# Patient Record
Sex: Female | Born: 1950 | Race: White | Hispanic: No | Marital: Married | Smoking: Former smoker
Health system: Southern US, Community
[De-identification: ages and names within clinical notes are randomized; demographics above are authoritative.]

## PROBLEM LIST (undated history)

## (undated) DIAGNOSIS — J45909 Unspecified asthma, uncomplicated: Secondary | ICD-10-CM

## (undated) DIAGNOSIS — I10 Essential (primary) hypertension: Secondary | ICD-10-CM

## (undated) DIAGNOSIS — G629 Polyneuropathy, unspecified: Secondary | ICD-10-CM

## (undated) DIAGNOSIS — E079 Disorder of thyroid, unspecified: Secondary | ICD-10-CM

## (undated) DIAGNOSIS — K7460 Unspecified cirrhosis of liver: Secondary | ICD-10-CM

## (undated) DIAGNOSIS — I219 Acute myocardial infarction, unspecified: Secondary | ICD-10-CM

## (undated) DIAGNOSIS — I509 Heart failure, unspecified: Secondary | ICD-10-CM

## (undated) DIAGNOSIS — Z9889 Other specified postprocedural states: Secondary | ICD-10-CM

## (undated) DIAGNOSIS — F419 Anxiety disorder, unspecified: Secondary | ICD-10-CM

## (undated) DIAGNOSIS — I4891 Unspecified atrial fibrillation: Secondary | ICD-10-CM

## (undated) DIAGNOSIS — Z950 Presence of cardiac pacemaker: Secondary | ICD-10-CM

## (undated) DIAGNOSIS — E119 Type 2 diabetes mellitus without complications: Secondary | ICD-10-CM

## (undated) DIAGNOSIS — J449 Chronic obstructive pulmonary disease, unspecified: Secondary | ICD-10-CM

## (undated) DIAGNOSIS — R112 Nausea with vomiting, unspecified: Secondary | ICD-10-CM

## (undated) DIAGNOSIS — D539 Nutritional anemia, unspecified: Secondary | ICD-10-CM

## (undated) DIAGNOSIS — E785 Hyperlipidemia, unspecified: Secondary | ICD-10-CM

## (undated) DIAGNOSIS — J189 Pneumonia, unspecified organism: Secondary | ICD-10-CM

## (undated) DIAGNOSIS — T7840XA Allergy, unspecified, initial encounter: Secondary | ICD-10-CM

## (undated) DIAGNOSIS — N186 End stage renal disease: Secondary | ICD-10-CM

## (undated) DIAGNOSIS — M1990 Unspecified osteoarthritis, unspecified site: Secondary | ICD-10-CM

## (undated) DIAGNOSIS — Z992 Dependence on renal dialysis: Secondary | ICD-10-CM

## (undated) HISTORY — PX: RADIOFREQUENCY ABLATION: SHX2290

## (undated) HISTORY — PX: ABDOMINAL HYSTERECTOMY: SHX81

## (undated) HISTORY — PX: CARDIAC CATHETERIZATION: SHX172

## (undated) HISTORY — DX: Anxiety disorder, unspecified: F41.9

## (undated) HISTORY — DX: Unspecified atrial fibrillation: I48.91

## (undated) HISTORY — DX: Essential (primary) hypertension: I10

## (undated) HISTORY — DX: Acute myocardial infarction, unspecified: I21.9

## (undated) HISTORY — DX: Hyperlipidemia, unspecified: E78.5

## (undated) HISTORY — DX: Allergy, unspecified, initial encounter: T78.40XA

## (undated) HISTORY — DX: Unspecified asthma, uncomplicated: J45.909

## (undated) HISTORY — DX: Unspecified cirrhosis of liver: K74.60

## (undated) HISTORY — DX: Type 2 diabetes mellitus without complications: E11.9

## (undated) HISTORY — PX: TUBAL LIGATION: SHX77

## (undated) HISTORY — PX: TOTAL KNEE ARTHROPLASTY: SHX125

## (undated) HISTORY — DX: Chronic obstructive pulmonary disease, unspecified: J44.9

## (undated) HISTORY — DX: Heart failure, unspecified: I50.9

## (undated) HISTORY — DX: Unspecified osteoarthritis, unspecified site: M19.90

## (undated) SURGERY — ESOPHAGOGASTRODUODENOSCOPY (EGD) WITH PROPOFOL
Anesthesia: Monitor Anesthesia Care

---
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
## 1999-02-09 ENCOUNTER — Emergency Department (HOSPITAL_COMMUNITY): Admission: EM | Admit: 1999-02-09 | Discharge: 1999-02-09 | Payer: Self-pay | Admitting: Emergency Medicine

## 2004-03-19 ENCOUNTER — Ambulatory Visit: Payer: Self-pay | Admitting: Internal Medicine

## 2004-04-14 ENCOUNTER — Ambulatory Visit: Payer: Self-pay

## 2004-12-02 ENCOUNTER — Ambulatory Visit: Payer: Self-pay | Admitting: Internal Medicine

## 2005-06-03 ENCOUNTER — Ambulatory Visit: Payer: Self-pay

## 2008-09-14 ENCOUNTER — Emergency Department (HOSPITAL_BASED_OUTPATIENT_CLINIC_OR_DEPARTMENT_OTHER): Admission: EM | Admit: 2008-09-14 | Discharge: 2008-09-14 | Payer: Self-pay | Admitting: Emergency Medicine

## 2008-09-14 ENCOUNTER — Ambulatory Visit: Payer: Self-pay | Admitting: Diagnostic Radiology

## 2013-03-06 ENCOUNTER — Other Ambulatory Visit: Payer: Self-pay | Admitting: *Deleted

## 2013-03-06 DIAGNOSIS — L819 Disorder of pigmentation, unspecified: Secondary | ICD-10-CM

## 2013-03-12 ENCOUNTER — Encounter: Payer: Self-pay | Admitting: Vascular Surgery

## 2013-03-13 ENCOUNTER — Ambulatory Visit: Payer: Medicare Other | Admitting: Vascular Surgery

## 2013-03-13 ENCOUNTER — Encounter (HOSPITAL_COMMUNITY): Payer: Medicare Other

## 2013-03-26 NOTE — Progress Notes (Signed)
This encounter was created in error - please disregard.

## 2013-03-30 ENCOUNTER — Encounter: Payer: Self-pay | Admitting: Surgery

## 2013-04-02 ENCOUNTER — Ambulatory Visit (HOSPITAL_COMMUNITY)
Admission: RE | Admit: 2013-04-02 | Discharge: 2013-04-02 | Disposition: A | Discharge status: Home / Self Care | Payer: Medicare Other | Source: Ambulatory Visit | Attending: Surgery | Admitting: Surgery

## 2013-04-02 ENCOUNTER — Encounter: Payer: Self-pay | Admitting: Surgery

## 2013-04-02 ENCOUNTER — Encounter (INDEPENDENT_AMBULATORY_CARE_PROVIDER_SITE_OTHER): Payer: Self-pay

## 2013-04-02 ENCOUNTER — Ambulatory Visit (INDEPENDENT_AMBULATORY_CARE_PROVIDER_SITE_OTHER): Payer: Medicare Other | Admitting: Surgery

## 2013-04-02 VITALS — BP 152/68 | HR 53 | Ht 62.5 in | Wt 213.4 lb

## 2013-04-02 DIAGNOSIS — R238 Other skin changes: Secondary | ICD-10-CM | POA: Insufficient documentation

## 2013-04-02 DIAGNOSIS — L819 Disorder of pigmentation, unspecified: Secondary | ICD-10-CM

## 2013-04-02 DIAGNOSIS — I739 Peripheral vascular disease, unspecified: Secondary | ICD-10-CM | POA: Insufficient documentation

## 2013-04-02 NOTE — Progress Notes (Signed)
Patient name: Alexis [name] MRN: [id number] DOB: [birth year] Sex: female   Referred by: Dr. Ronnald Ramp  Reason for referral:  Chief Complaint  Patient presents with  . New Evaluation    dicoloration of skin L toes - cool to touch    HISTORY OF PRESENT ILLNESS: 63 year old female who was referred today for evaluation of bluish discoloration of her left foot.  She was scheduled to have her toenail removed and was sent for vascular evaluation given the discoloration.  The patient states that this all goes back to when she had a left knee replacement.  She describes no complications during her perioperative period.  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  She does not have pain in the foot or symptoms of claudication.  Patient suffers from diabetes which is not well controlled.  She states her blood sugars in the morning are and the 200 range.  She has a history of congestive heart failure.  She is in atrial fibrillation.  She is managed with amiodarone and Eliquis.  She has been on Coumadin in the past, however had a fall with an orbital floor fracture and has not been on Coumadin since that time.  Her hypercholesterolemia is managed with a statin.  Her blood pressure is managed with an ACE inhibitor.  The patient has a history of smoking but quit in 2000.  Past Medical History  Diagnosis Date  . CHF (congestive heart failure)   . Diabetes mellitus without complication   . Atrial fibrillation     Past Surgical History  Procedure Laterality Date  . Total knee arthroplasty    . Coronary artery bypass graft      History   Social History  . Marital Status: Married    Spouse Name: N/A    Number of Children: N/A  . Years of Education: N/A   Occupational History  . Not on file.   Social History Main Topics  . Smoking status: Former Smoker    Quit date: 03/01/1998  . Smokeless tobacco: Never Used    . Alcohol Use: No  . Drug Use: No  . Sexual Activity: Not on file   Other Topics Concern  . Not on file   Social History Narrative  . No narrative on file    Family History  Problem Relation Age of Onset  . Diabetes Mother   . Hyperlipidemia Mother   . Diabetes Father   . Hyperlipidemia Father   . Heart disease Father     before age 45  . Heart attack Father   . Diabetes Brother   . Hyperlipidemia Brother   . Heart attack Brother     Allergies as of 04/02/2013  . (No Known Allergies)    No current outpatient prescriptions on file prior to visit.   No current facility-administered medications on file prior to visit.     REVIEW OF SYSTEMS: Cardiovascular: No chest pain, chest pressure, palpitations, orthopnea, or dyspnea on exertion. No claudication or rest pain,  No history of DVT or phlebitis. Pulmonary: Positive for asthma and wheezing Neurologic:  Positive for numbness in her left foot No dizziness. Hematologic: No bleeding problems or clotting disorders. Musculoskeletal: No joint pain or joint swelling. Gastrointestinal: No blood in stool or hematemesis Genitourinary: No dysuria or hematuria. Psychiatric:: No history of major depression. Integumentary: No rashes or ulcers. Constitutional: No fever or chills.  PHYSICAL EXAMINATION: General: The patient appears their stated age.  Vital signs are BP   Ht 5' 2.5" (1.588 m)  Wt 213 lb 6.4 oz (96.798 kg)  BMI 38.39 kg/m2 HEENT:  No gross abnormalities Pulmonary: Respirations are non-labored Abdomen: Soft and non-tender  Musculoskeletal: There are no major deformities.   Neurologic: No focal weakness or paresthesias are detected, Skin: There are no ulcer or rashes noted. Psychiatric: The patient has normal affect. Cardiovascular: There is a regular rate and rhythm without significant murmur appreciated.  No carotid bruit  Diagnostic Studies: Ankle brachial indices were obtained today which I have ordered  and reviewed.  She has normal ankle-brachial indices, 1.1 on the right and one 0.04 on the left.  All waveforms are triphasic.  Digital pressures are 107 on the right and 112 on the left    Assessment:  Discoloration, left foot Plan: I have reassured the patient today than the discoloration is not secondary to underlying vascular disease.  Her ankle-brachial indices as well as toe pressures were completely normal today.  This suggests that her discoloration secondary to underlying nerve damage, which according the patient has been confirmed with nerve conduction studies in the past.  I discussed the importance of daily foot examinations and to contact me should she develop a nonhealing wound.  Otherwise, she will followup on an as-needed basis.     Eldridge Abrahams, M.D. [facility] Office: [phone] Pager:  [phone]

## 2014-03-01 LAB — HM DIABETES EYE EXAM

## 2014-07-09 ENCOUNTER — Encounter (HOSPITAL_BASED_OUTPATIENT_CLINIC_OR_DEPARTMENT_OTHER): Payer: Self-pay | Admitting: Emergency Medicine

## 2014-07-09 ENCOUNTER — Emergency Department (HOSPITAL_BASED_OUTPATIENT_CLINIC_OR_DEPARTMENT_OTHER)
Admission: EM | Admit: 2014-07-09 | Discharge: 2014-07-09 | Disposition: A | Discharge status: Home / Self Care | Payer: Medicare Other | Attending: Emergency Medicine | Admitting: Emergency Medicine

## 2014-07-09 DIAGNOSIS — Z951 Presence of aortocoronary bypass graft: Secondary | ICD-10-CM | POA: Insufficient documentation

## 2014-07-09 DIAGNOSIS — L03115 Cellulitis of right lower limb: Secondary | ICD-10-CM | POA: Insufficient documentation

## 2014-07-09 DIAGNOSIS — E079 Disorder of thyroid, unspecified: Secondary | ICD-10-CM | POA: Diagnosis not present

## 2014-07-09 DIAGNOSIS — I509 Heart failure, unspecified: Secondary | ICD-10-CM | POA: Insufficient documentation

## 2014-07-09 DIAGNOSIS — Z79899 Other long term (current) drug therapy: Secondary | ICD-10-CM | POA: Insufficient documentation

## 2014-07-09 DIAGNOSIS — M10071 Idiopathic gout, right ankle and foot: Secondary | ICD-10-CM | POA: Insufficient documentation

## 2014-07-09 DIAGNOSIS — E119 Type 2 diabetes mellitus without complications: Secondary | ICD-10-CM | POA: Insufficient documentation

## 2014-07-09 DIAGNOSIS — M79671 Pain in right foot: Secondary | ICD-10-CM | POA: Diagnosis present

## 2014-07-09 DIAGNOSIS — Z87891 Personal history of nicotine dependence: Secondary | ICD-10-CM | POA: Diagnosis not present

## 2014-07-09 DIAGNOSIS — M109 Gout, unspecified: Secondary | ICD-10-CM

## 2014-07-09 DIAGNOSIS — I4891 Unspecified atrial fibrillation: Secondary | ICD-10-CM | POA: Diagnosis not present

## 2014-07-09 DIAGNOSIS — L03119 Cellulitis of unspecified part of limb: Secondary | ICD-10-CM

## 2014-07-09 HISTORY — DX: Disorder of thyroid, unspecified: E07.9

## 2014-07-09 MED ORDER — DEXAMETHASONE 4 MG PO TABS
10.0000 mg | ORAL_TABLET | Freq: Once | ORAL | Status: AC
  Administered 2014-07-09: 10 mg via ORAL

## 2014-07-09 MED ORDER — CLINDAMYCIN HCL 150 MG PO CAPS: 450.0000 mg | ORAL_CAPSULE | Freq: Three times a day (TID) | ORAL | Status: DC

## 2014-07-09 MED ORDER — CLINDAMYCIN HCL 150 MG PO CAPS
450.0000 mg | ORAL_CAPSULE | Freq: Once | ORAL | Status: AC
  Administered 2014-07-09: 450 mg via ORAL
  Filled 2014-07-09: qty 3

## 2014-07-09 MED ORDER — DEXAMETHASONE 4 MG PO TABS
ORAL_TABLET | ORAL | Status: AC
  Filled 2014-07-09: qty 3

## 2014-07-09 NOTE — Discharge Instructions (Signed)

## 2014-07-09 NOTE — ED Notes (Signed)
Pt in c/o insect bite to R foot, erythema and swelling noted to foot in triage. Pt first thought it was gout, but then the general appearance was different than her gout sx over the past few days.

## 2014-07-09 NOTE — ED Notes (Signed)
Pt repositioned for comfort, elevated rt foot per pt request

## 2014-07-09 NOTE — ED Notes (Signed)
Presents to ED w/ Rt foot pain, swelling and redness

## 2014-07-09 NOTE — ED Notes (Signed)
MD at bedside. 

## 2014-07-09 NOTE — ED Provider Notes (Signed)
CSN: [id number]     Arrival date & time 07/09/14  76 History   First MD Initiated Contact with Patient 07/09/14 1546     Chief Complaint  Patient presents with  . Insect Bite     (Consider location/radiation/quality/duration/timing/severity/associated sxs/prior Treatment) HPI Comments: R foot pain in her medial midfoot behind 1st-3rd toes. No fevers, no vomiting. She thought this was her gout, as she has hx of the same. Denies any systemic symptoms. Has hx acute renal failure from "a gout medicine" previously.  Patient is a 64 y.o. female presenting with lower extremity pain. The history is provided by the patient.  Foot Pain This is a new problem. The current episode started more than 2 days ago (4 days ago). The problem occurs constantly. The problem has been gradually worsening. Pertinent negatives include no chest pain, no abdominal pain, no headaches and no shortness of breath. Nothing aggravates the symptoms. Nothing relieves the symptoms. She has tried nothing for the symptoms.    Past Medical History  Diagnosis Date  . CHF (congestive heart failure)   . Diabetes mellitus without complication   . Atrial fibrillation   . Thyroid disease    Past Surgical History  Procedure Laterality Date  . Total knee arthroplasty    . Coronary artery bypass graft     Family History  Problem Relation Age of Onset  . Diabetes Mother   . Hyperlipidemia Mother   . Diabetes Father   . Hyperlipidemia Father   . Heart disease Father     before age 66  . Heart attack Father   . Diabetes Brother   . Hyperlipidemia Brother   . Heart attack Brother    History  Substance Use Topics  . Smoking status: Former Smoker    Quit date: 03/01/1998  . Smokeless tobacco: Never Used  . Alcohol Use: No   OB History    No data available     Review of Systems  Constitutional: Negative for fever and chills.  Respiratory: Negative for shortness of breath.   Cardiovascular: Negative for chest  pain.  Gastrointestinal: Negative for vomiting and abdominal pain.  Neurological: Negative for headaches.  All other systems reviewed and are negative.     Allergies  Sulfa antibiotics  Home Medications   Prior to Admission medications   Medication Sig Start Date End Date Taking? Authorizing Provider  Apixaban (ELIQUIS PO) Take by mouth daily.    Historical Provider, MD  atorvastatin (LIPITOR) 10 MG tablet Take 10 mg by mouth daily.    Historical Provider, MD  clindamycin (CLEOCIN) 150 MG capsule Take 3 capsules (450 mg total) by mouth 3 (three) times daily. 07/09/14   Evelina Bucy, MD  cloNIDine (CATAPRES) 0.1 MG tablet Take 0.1 mg by mouth 2 (two) times daily.    Historical Provider, MD  colchicine 0.6 MG tablet Take 0.6 mg by mouth as needed.    Historical Provider, MD  HYDROcodone-acetaminophen (NORCO) 10-325 MG per tablet Take 1 tablet by mouth every 6 (six) hours as needed.    Historical Provider, MD  levothyroxine (SYNTHROID, LEVOTHROID) 175 MCG tablet Take 175 mcg by mouth daily before breakfast.    Historical Provider, MD  metoprolol succinate (TOPROL-XL) 25 MG 24 hr tablet Take 25 mg by mouth daily.    Historical Provider, MD  sertraline (ZOLOFT) 100 MG tablet Take 100 mg by mouth daily.    Historical Provider, MD  torsemide (DEMADEX) 20 MG tablet Take 20 mg by mouth daily. 1/2  tablet daily    Historical Provider, MD   BP 133/58 mmHg  Pulse 86  Temp(Src) 98.2 F (36.8 C) (Oral)  Resp 18  Ht 5' 2"  (1.575 m)  Wt 200 lb (90.719 kg)  BMI 36.57 kg/m2  SpO2 99% Physical Exam  Constitutional: She is oriented to person, place, and time. She appears well-developed and well-nourished. No distress.  HENT:  Head: Normocephalic and atraumatic.  Mouth/Throat: Oropharynx is clear and moist.  Eyes: EOM are normal. Pupils are equal, round, and reactive to light.  Neck: Normal range of motion. Neck supple.  Cardiovascular: Normal rate and regular rhythm.  Exam reveals no friction  rub.   No murmur heard. Pulmonary/Chest: Effort normal and breath sounds normal. No respiratory distress. She has no wheezes. She has no rales.  Abdominal: Soft. She exhibits no distension. There is no tenderness. There is no rebound.  Musculoskeletal: Normal range of motion. She exhibits no edema.       Feet:  Sole of R foot without lesion, no foreign bodies.  Neurological: She is alert and oriented to person, place, and time.  Skin: She is not diaphoretic.  Nursing note and vitals reviewed.   ED Course  Procedures (including critical care time) Labs Review Labs Reviewed - No data to display  Imaging Review No results found.   EKG Interpretation None      MDM   Final diagnoses:  Cellulitis of foot  Acute gout of right foot, unspecified cause    58F here with R foot pain and swelling. She thought this was her chronic gout, however it hasn't gotten betterand she normally has gout in her right great toe not in her right midfoot. She denies any fever, vomiting, diarrhea. She has had renal failure after using an unspecified gout medication and she states colchicine gives her nausea and vomiting. I suspect her previous gout medication to cause renal failure was indomethacin. Here right foot is erythematous and mildly swollen. Is no pitting edema. No foreign bodies in the bottom of the foot. No lesions were trauma. Since she is diabetic we'll treat for cellulitis and gout also. I do not want to cause blood sugar issues so we'll give 1 dose of Decadron and not a large prednisone burst. Will start her on clindamycin and encourage PCP follow-up.    Evelina Bucy, MD 07/09/14 [phone]

## 2014-07-12 ENCOUNTER — Ambulatory Visit (INDEPENDENT_AMBULATORY_CARE_PROVIDER_SITE_OTHER): Payer: Medicare Other | Admitting: Medical

## 2014-07-12 ENCOUNTER — Ambulatory Visit (HOSPITAL_BASED_OUTPATIENT_CLINIC_OR_DEPARTMENT_OTHER)
Admission: RE | Admit: 2014-07-12 | Discharge: 2014-07-12 | Disposition: A | Discharge status: Home / Self Care | Payer: Medicare Other | Source: Ambulatory Visit | Attending: Medical | Admitting: Medical

## 2014-07-12 ENCOUNTER — Encounter: Payer: Self-pay | Admitting: Medical

## 2014-07-12 VITALS — BP 157/85 | HR 98 | Temp 97.4°F | Ht 62.5 in | Wt 199.4 lb

## 2014-07-12 DIAGNOSIS — I2510 Atherosclerotic heart disease of native coronary artery without angina pectoris: Secondary | ICD-10-CM

## 2014-07-12 DIAGNOSIS — J45909 Unspecified asthma, uncomplicated: Secondary | ICD-10-CM | POA: Insufficient documentation

## 2014-07-12 DIAGNOSIS — M25579 Pain in unspecified ankle and joints of unspecified foot: Secondary | ICD-10-CM | POA: Insufficient documentation

## 2014-07-12 DIAGNOSIS — M25571 Pain in right ankle and joints of right foot: Secondary | ICD-10-CM | POA: Diagnosis present

## 2014-07-12 DIAGNOSIS — E119 Type 2 diabetes mellitus without complications: Secondary | ICD-10-CM | POA: Diagnosis not present

## 2014-07-12 DIAGNOSIS — E038 Other specified hypothyroidism: Secondary | ICD-10-CM

## 2014-07-12 DIAGNOSIS — I509 Heart failure, unspecified: Secondary | ICD-10-CM

## 2014-07-12 DIAGNOSIS — M158 Other polyosteoarthritis: Secondary | ICD-10-CM | POA: Diagnosis not present

## 2014-07-12 DIAGNOSIS — I4821 Permanent atrial fibrillation: Secondary | ICD-10-CM | POA: Insufficient documentation

## 2014-07-12 DIAGNOSIS — J301 Allergic rhinitis due to pollen: Secondary | ICD-10-CM

## 2014-07-12 DIAGNOSIS — J4520 Mild intermittent asthma, uncomplicated: Secondary | ICD-10-CM

## 2014-07-12 DIAGNOSIS — E039 Hypothyroidism, unspecified: Secondary | ICD-10-CM | POA: Insufficient documentation

## 2014-07-12 DIAGNOSIS — I4891 Unspecified atrial fibrillation: Secondary | ICD-10-CM

## 2014-07-12 DIAGNOSIS — I10 Essential (primary) hypertension: Secondary | ICD-10-CM

## 2014-07-12 DIAGNOSIS — Z87898 Personal history of other specified conditions: Secondary | ICD-10-CM

## 2014-07-12 DIAGNOSIS — J309 Allergic rhinitis, unspecified: Secondary | ICD-10-CM | POA: Insufficient documentation

## 2014-07-12 DIAGNOSIS — M1990 Unspecified osteoarthritis, unspecified site: Secondary | ICD-10-CM | POA: Insufficient documentation

## 2014-07-12 DIAGNOSIS — F1911 Other psychoactive substance abuse, in remission: Secondary | ICD-10-CM

## 2014-07-12 LAB — CBC WITH DIFFERENTIAL/PLATELET
Basophils Absolute: 0 10*3/uL (ref 0.0–0.1)
Basophils Relative: 0.4 % (ref 0.0–3.0)
Eosinophils Absolute: 0.1 10*3/uL (ref 0.0–0.7)
Eosinophils Relative: 1.2 % (ref 0.0–5.0)
HCT: 42.6 % (ref 36.0–46.0)
Hemoglobin: 14.9 g/dL (ref 12.0–15.0)
LYMPHS ABS: 1 10*3/uL (ref 0.7–4.0)
Lymphocytes Relative: 16.3 % (ref 12.0–46.0)
MCHC: 34.9 g/dL (ref 30.0–36.0)
MCV: 88.1 fl (ref 78.0–100.0)
MONO ABS: 0.5 10*3/uL (ref 0.1–1.0)
Monocytes Relative: 8.2 % (ref 3.0–12.0)
NEUTROS PCT: 73.9 % (ref 43.0–77.0)
Neutro Abs: 4.6 10*3/uL (ref 1.4–7.7)
Platelets: 124 10*3/uL — ABNORMAL LOW (ref 150.0–400.0)
RBC: 4.84 Mil/uL (ref 3.87–5.11)
RDW: 15.7 % — AB (ref 11.5–15.5)
WBC: 6.2 10*3/uL (ref 4.0–10.5)

## 2014-07-12 LAB — COMPREHENSIVE METABOLIC PANEL
ALBUMIN: 3.9 g/dL (ref 3.5–5.2)
ALK PHOS: 138 U/L — AB (ref 39–117)
ALT: 65 U/L — AB (ref 0–35)
AST: 64 U/L — AB (ref 0–37)
BUN: 28 mg/dL — ABNORMAL HIGH (ref 6–23)
CO2: 32 mEq/L (ref 19–32)
CREATININE: 0.84 mg/dL (ref 0.40–1.20)
Calcium: 10 mg/dL (ref 8.4–10.5)
Chloride: 96 mEq/L (ref 96–112)
GFR: 72.23 mL/min (ref >60.00)
Glucose, Bld: 170 mg/dL — ABNORMAL HIGH (ref 70–99)
Potassium: 3.3 mEq/L — ABNORMAL LOW (ref 3.5–5.1)
Sodium: 135 mEq/L (ref 135–145)
Total Bilirubin: 0.7 mg/dL (ref 0.2–1.2)
Total Protein: 8.4 g/dL — ABNORMAL HIGH (ref 6.0–8.3)

## 2014-07-12 LAB — URIC ACID: Uric Acid, Serum: 7.4 mg/dL — ABNORMAL HIGH (ref 2.4–7.0)

## 2014-07-12 NOTE — Assessment & Plan Note (Signed)
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

## 2014-07-12 NOTE — Assessment & Plan Note (Signed)
Seasonal allergies- she is on flonase. On this one time a day.

## 2014-07-12 NOTE — Assessment & Plan Note (Signed)
Rt foot cellulitis improving vs gout. Continue antibiotic. Get xray of foot, cbc, cmp, and uric acid.  Will follow labs and then determine if give nsaid. Side effects to colchicine in the past.  Follow up on wed morning or sooner. If foot signs or symptoms worsen over weekend then ED evaluation.

## 2014-07-12 NOTE — Assessment & Plan Note (Signed)
Pt has pacemaker now. Hx of ablation sx.

## 2014-07-12 NOTE — Assessment & Plan Note (Signed)
htn- pt on clonidine and metoprolol.

## 2014-07-12 NOTE — Assessment & Plan Note (Signed)
when younger and when more overweight. No inhaler use recently.

## 2014-07-12 NOTE — Assessment & Plan Note (Signed)
Hx of 2 MI.

## 2014-07-12 NOTE — Assessment & Plan Note (Signed)
Osteoarthritis- her neck and her knees.

## 2014-07-12 NOTE — Progress Notes (Signed)
Pre visit review using our clinic review tool, if applicable. No additional management support is needed unless otherwise documented below in the visit note. 

## 2014-07-12 NOTE — Assessment & Plan Note (Signed)
on levothyroxine

## 2014-07-12 NOTE — Patient Instructions (Signed)
Diabetes Diabetic type II- pt is on humolog before dinner(sliding scale) and lantus at night 60 units. Last a1-c was 6. Pt sees endocrinologist.   Allergic rhinitis Seasonal allergies- she is on flonase. On this one time a day.   Osteoarthritis Osteoarthritis- her neck and her knees.    Asthma  when younger and when more overweight. No inhaler use recently.   CHF (congestive heart failure) history of with several heart attacks in past. Hx of atrial fibrillation. Pt has pacemaker. POt is on demadex.   CAD (coronary artery disease) Hx of 2 MI.   Atrial fibrillation Pt has pacemaker now. Hx of ablation sx.   HTN (hypertension) htn- pt on clonidine and metoprolol.   Hypothyroidism  on levothyroxine   Pain in joint, ankle and foot Rt foot cellulitis improving vs gout. Continue antibiotic. Get xray of foot, cbc, cmp, and uric acid.  Will follow labs and then determine if give nsaid. Side effects to colchicine in the past.  Follow up on wed morning or sooner. If foot signs or symptoms worsen over weekend then ED evaluation.     Note 50 minutes was spent with pt today.

## 2014-07-12 NOTE — Assessment & Plan Note (Signed)
history of with several heart attacks in past. Hx of atrial fibrillation. Pt has pacemaker. POt is on demadex.

## 2014-07-12 NOTE — Progress Notes (Signed)
Subjective:    Patient ID: Alexis [name], female    DOB: 27-Sep-1950, 64 y.o.   MRN: [id number]  HPI  I have reviewed pt PMH, PSH, FH, Social History and Surgical History.  Anxiety- Last xanax she had was 21 st of march. She wanted to get of xanax for 30 years. She was motivated by her brothers who passed away from overdoses.  Diabetic type II- pt is on humolog before dinner(sliding scale) and lantus at night 60 units. Last a1-c was 6. Pt sees endocrinologist.  Diabetic neuropathy- Pt was given hydrocodone for this in the past. She used to be on med 3 times a day. She told her neurologist she wanted to get off of med. So she was tapered down over past 2-3 weeks. Pt tried neurontin in past but had side effects. Other medications for nerve pain have not worked. Pt is on new medication for nerve pain but she can't remember and she left med at home.  Seasonal allergies- she is on flonase. On this one time a day.   Osteoarthritis- her neck and her knees.   Asthma- when younger and when more overweight. No inhaler use recently.  Chf- history of with several heart attacks in past. Hx of atrial fibrillation. Pt has pacemaker. Pt is on demadex.  CAD- hx of MI 2-3 per pt.  htn- pt on clonidine and metoprolol.  Hyperlipdemia- She is on lipitor.   Hypothryoid- on levothyroxine.  Anxiety- pt is on sertraline 100 mg q day.   Pt never had a colonoscopy and refuses. No mammogram for a while.  Pt recently on 07-09-2014 treated for gout. Pt started on clindamycin and she feels some better. No fevers or chills. Pt states Monday morning was swollen. Then Tuesday she went to ED. Pt given decadron low dose.     Review of Systems  Constitutional: Negative for fever, chills, diaphoresis, activity change and fatigue.  Respiratory: Negative for cough, chest tightness and shortness of breath.   Cardiovascular: Negative for chest pain, palpitations and leg swelling.  Gastrointestinal: Negative  for nausea, vomiting and abdominal pain.  Musculoskeletal: Negative for neck pain and neck stiffness.       Rt foot pain.  Neurological: Negative for dizziness, tremors, seizures, syncope, facial asymmetry, speech difficulty, weakness, light-headedness, numbness and headaches.  Psychiatric/Behavioral: Negative for behavioral problems, confusion and agitation. The patient is not nervous/anxious.    Past Medical History  Diagnosis Date  . CHF (congestive heart failure)   . Diabetes mellitus without complication   . Atrial fibrillation   . Thyroid disease   . Allergy   . Anxiety   . Arthritis   . Asthma   . COPD (chronic obstructive pulmonary disease)   . Hyperlipidemia   . Hypertension   . Myocardial infarction     History   Social History  . Marital Status: Married    Spouse Name: N/A  . Number of Children: N/A  . Years of Education: N/A   Occupational History  . Not on file.   Social History Main Topics  . Smoking status: Former Smoker    Quit date: 03/01/1998  . Smokeless tobacco: Never Used  . Alcohol Use: No  . Drug Use: No  . Sexual Activity: Not on file   Other Topics Concern  . Not on file   Social History Narrative    Past Surgical History  Procedure Laterality Date  . Total knee arthroplasty    . Coronary artery bypass graft  Family History  Problem Relation Age of Onset  . Diabetes Mother   . Hyperlipidemia Mother   . Diabetes Father   . Hyperlipidemia Father   . Heart disease Father     before age 59  . Heart attack Father   . Diabetes Brother   . Hyperlipidemia Brother   . Heart attack Brother     Allergies  Allergen Reactions  . Sulfa Antibiotics Hives    Current Outpatient Prescriptions on File Prior to Visit  Medication Sig Dispense Refill  . Apixaban (ELIQUIS PO) Take by mouth daily.    Marland Kitchen atorvastatin (LIPITOR) 10 MG tablet Take 10 mg by mouth daily.    . clindamycin (CLEOCIN) 150 MG capsule Take 3 capsules (450 mg total)  by mouth 3 (three) times daily. 90 capsule 0  . cloNIDine (CATAPRES) 0.1 MG tablet Take 0.1 mg by mouth 2 (two) times daily.    Marland Kitchen HYDROcodone-acetaminophen (NORCO) 10-325 MG per tablet Take 1 tablet by mouth every 6 (six) hours as needed.    Marland Kitchen levothyroxine (SYNTHROID, LEVOTHROID) 175 MCG tablet Take 175 mcg by mouth daily before breakfast.    . metoprolol succinate (TOPROL-XL) 25 MG 24 hr tablet Take 25 mg by mouth daily.    . sertraline (ZOLOFT) 100 MG tablet Take 100 mg by mouth daily.    Marland Kitchen torsemide (DEMADEX) 20 MG tablet Take 20 mg by mouth daily. 1/2 tablet daily     No current facility-administered medications on file prior to visit.    BP 157/85 mmHg  Pulse 98  Temp(Src) 97.4 F (36.3 C) (Oral)  Ht 5' 2.5" (1.588 m)  Wt 199 lb 6.4 oz (90.447 kg)  BMI 35.87 kg/m2  SpO2 98%       Objective:   Physical Exam  General Mental Status- Alert. General Appearance- Not in acute distress.   Skin General: Color- Normal Color. Moisture- Normal Moisture.  Neck Carotid Arteries- Normal color. Moisture- Normal Moisture. No carotid bruits. No JVD.  Chest and Lung Exam Auscultation: Breath Sounds:-Normal.  Cardiovascular Auscultation:Rythm- Regular. Murmurs & Other Heart Sounds:Auscultation of the heart reveals- No Murmurs.  Abdomen Inspection:-Inspeection Normal. Palpation/Percussion:Note:No mass. Palpation and Percussion of the abdomen reveal- Non Tender, Non Distended + BS, no rebound or guarding.    Neurologic Cranial Nerve exam:- CN III-XII intact(No nystagmus), symmetric smile. Strength:- 5/5 equal and symmetric strength both upper and lower extremities.  Rt foot- pulses intact. No ulcers or lesions. Mild red area above toes. Faint warmth and tender.      Assessment & Plan:

## 2014-07-12 NOTE — Assessment & Plan Note (Signed)
Diabetic type II- pt is on humolog before dinner(sliding scale) and lantus at night 60 units. Last a1-c was 6. Pt sees endocrinologist.

## 2014-07-17 ENCOUNTER — Ambulatory Visit: Payer: Medicare Other | Admitting: Medical

## 2014-07-18 ENCOUNTER — Ambulatory Visit (INDEPENDENT_AMBULATORY_CARE_PROVIDER_SITE_OTHER): Payer: Medicare Other | Admitting: Medical

## 2014-07-18 ENCOUNTER — Encounter: Payer: Self-pay | Admitting: Medical

## 2014-07-18 VITALS — BP 129/80 | HR 100 | Temp 98.4°F | Ht 62.5 in | Wt 197.0 lb

## 2014-07-18 DIAGNOSIS — E876 Hypokalemia: Secondary | ICD-10-CM | POA: Diagnosis not present

## 2014-07-18 DIAGNOSIS — D696 Thrombocytopenia, unspecified: Secondary | ICD-10-CM

## 2014-07-18 DIAGNOSIS — M25571 Pain in right ankle and joints of right foot: Secondary | ICD-10-CM

## 2014-07-18 LAB — CBC WITH DIFFERENTIAL/PLATELET
BASOS ABS: 0 10*3/uL (ref 0.0–0.1)
BASOS PCT: 0.2 % (ref 0.0–3.0)
EOS ABS: 0.1 10*3/uL (ref 0.0–0.7)
Eosinophils Relative: 0.9 % (ref 0.0–5.0)
HCT: 41.8 % (ref 36.0–46.0)
Hemoglobin: 14.6 g/dL (ref 12.0–15.0)
LYMPHS PCT: 16.4 % (ref 12.0–46.0)
Lymphs Abs: 1 10*3/uL (ref 0.7–4.0)
MCHC: 34.8 g/dL (ref 30.0–36.0)
MCV: 89 fl (ref 78.0–100.0)
MONOS PCT: 8.7 % (ref 3.0–12.0)
Monocytes Absolute: 0.5 10*3/uL (ref 0.1–1.0)
NEUTROS PCT: 73.8 % (ref 43.0–77.0)
Neutro Abs: 4.5 10*3/uL (ref 1.4–7.7)
Platelets: 104 10*3/uL — ABNORMAL LOW (ref 150.0–400.0)
RBC: 4.7 Mil/uL (ref 3.87–5.11)
RDW: 15.1 % (ref 11.5–15.5)
WBC: 6.1 10*3/uL (ref 4.0–10.5)

## 2014-07-18 LAB — COMPREHENSIVE METABOLIC PANEL
ALBUMIN: 4.1 g/dL (ref 3.5–5.2)
ALT: 56 U/L — ABNORMAL HIGH (ref 0–35)
AST: 55 U/L — AB (ref 0–37)
Alkaline Phosphatase: 143 U/L — ABNORMAL HIGH (ref 39–117)
BUN: 27 mg/dL — AB (ref 6–23)
CALCIUM: 10.2 mg/dL (ref 8.4–10.5)
CHLORIDE: 99 meq/L (ref 96–112)
CO2: 31 meq/L (ref 19–32)
CREATININE: 0.87 mg/dL (ref 0.40–1.20)
GFR: 69.36 mL/min (ref >60.00)
GLUCOSE: 162 mg/dL — AB (ref 70–99)
POTASSIUM: 3.7 meq/L (ref 3.5–5.1)
Sodium: 136 mEq/L (ref 135–145)
Total Bilirubin: 0.7 mg/dL (ref 0.2–1.2)
Total Protein: 7.9 g/dL (ref 6.0–8.3)

## 2014-07-18 NOTE — Patient Instructions (Signed)
Start low dose ibuprofen 200 mg 3 times a day. Advance to 400 mg 3 times a day if needed. Please sign release of information form so we can get records from prior provider to know for sure what meds caused renal failure.  Continue clindamycin for possible infection.   Call us on Monday and update [place] on how your foot feels. May at that point stop antibiotic.  Please get labs today.  Follow up 2 weeks or sooner. Will let your know based on Monday call.  Any worsening changes to foot/pain then ED eval.

## 2014-07-18 NOTE — Progress Notes (Addendum)
Subjective:    Patient ID: Alexis [name], female    DOB: 05/09/1950, 64 y.o.   MRN: [id number]  HPI  Pt in for follow up. Her rt foot still hurts. I treated her for cellulitis after ED saw her(I advised to continue based on ED evaluation and fact still faint warm and tender when I saw her). Pt wbc were not elevated. Pt states side effects to colchicine and pharmacist as well as pt state renal failure with allopurinol in the past. Then husband states it was indocin.(Note at time of last visit differential dx was infection vs gout. But her hx of reported side effects to some gout medicine and renal failure to other prohibited aggressive tx of gout). Pt can't say for sure which med caused the acute renal failure in the past)   Uric acid was mild elevated on last labs.  On her labs platlets mild low and k mild low. Pt has been eating banana every day  Pt then later speculates indocin is med that may have caused renal failure.   Review of Systems  Constitutional: Negative for fever, chills and fatigue.  Respiratory: Negative for cough, choking and wheezing.   Cardiovascular: Negative for chest pain and palpitations.  Gastrointestinal: Negative.   Musculoskeletal: Negative for myalgias, joint swelling, arthralgias, gait problem and neck stiffness.       Rt foot pain mild and faint warmth.  Neurological: Negative for dizziness, seizures, weakness, numbness and headaches.  Hematological: Negative for adenopathy. Does not bruise/bleed easily.   Past Medical History  Diagnosis Date  . CHF (congestive heart failure)   . Diabetes mellitus without complication   . Atrial fibrillation   . Thyroid disease   . Allergy   . Anxiety   . Arthritis   . Asthma   . COPD (chronic obstructive pulmonary disease)   . Hyperlipidemia   . Hypertension   . Myocardial infarction     History   Social History  . Marital Status: Married    Spouse Name: N/A  . Number of Children: N/A  . Years of  Education: N/A   Occupational History  . Not on file.   Social History Main Topics  . Smoking status: Former Smoker    Quit date: 03/01/1998  . Smokeless tobacco: Never Used  . Alcohol Use: No  . Drug Use: No  . Sexual Activity: No   Other Topics Concern  . Not on file   Social History Narrative    Past Surgical History  Procedure Laterality Date  . Total knee arthroplasty    . Coronary artery bypass graft      Family History  Problem Relation Age of Onset  . Diabetes Mother   . Hyperlipidemia Mother   . Diabetes Father   . Hyperlipidemia Father   . Heart disease Father     before age 11  . Heart attack Father   . Diabetes Brother   . Hyperlipidemia Brother   . Heart attack Brother     Allergies  Allergen Reactions  . Sulfa Antibiotics Hives    Current Outpatient Prescriptions on File Prior to Visit  Medication Sig Dispense Refill  . Apixaban (ELIQUIS PO) Take by mouth daily.    Marland Kitchen atorvastatin (LIPITOR) 10 MG tablet Take 10 mg by mouth daily.    . baclofen (LIORESAL) 10 MG tablet Take 10 mg by mouth 3 (three) times daily.    . clindamycin (CLEOCIN) 150 MG capsule Take 3 capsules (450 mg total)  by mouth 3 (three) times daily. 90 capsule 0  . cloNIDine (CATAPRES) 0.1 MG tablet Take 0.1 mg by mouth 2 (two) times daily.    Marland Kitchen HYDROcodone-acetaminophen (NORCO) 10-325 MG per tablet Take 1 tablet by mouth every 6 (six) hours as needed.    Marland Kitchen levothyroxine (SYNTHROID, LEVOTHROID) 175 MCG tablet Take 175 mcg by mouth daily before breakfast.    . metoprolol succinate (TOPROL-XL) 25 MG 24 hr tablet Take 25 mg by mouth daily.    . Prenatal Vit-Fe Fumarate-FA (MULTIVITAMIN-PRENATAL) 27-0.8 MG TABS tablet Take 1 tablet by mouth daily at 12 noon. Takes for nail and hair growth    . sertraline (ZOLOFT) 100 MG tablet Take 100 mg by mouth daily.    Marland Kitchen spironolactone (ALDACTONE) 25 MG tablet Take 25 mg by mouth daily.    Marland Kitchen torsemide (DEMADEX) 20 MG tablet Take 20 mg by mouth daily.  1/2 tablet daily     No current facility-administered medications on file prior to visit.    BP 129/80 mmHg  Pulse 100  Temp(Src) 98.4 F (36.9 C) (Oral)  Ht 5' 2.5" (1.588 m)  Wt 197 lb (89.359 kg)  BMI 35.44 kg/m2  SpO2 93%       Objective:   Physical Exam  General- No acute distress. Pleasant patient. Neck- Full range of motion, no jvd Lungs- Clear, even and unlabored. Heart- regular rate and rhythm. Neurologic- CNII- XII grossly intact.   Rt foot- pulses intact. No ulcers or lesions. Mild red area above toes. Faint warmth and tender.     Assessment & Plan:

## 2014-07-18 NOTE — Assessment & Plan Note (Addendum)
Start low dose ibuprofen 200 mg 3 times a day. Advance to 400 mg 3 times a day if needed. Please sign release of information form so we can get records from prior provider to know for sure what meds caused renal failure.  Continue clindamycin for possible infection.   Call us on Monday and update [place] on how your foot feels. May at that point stop antibiotic.

## 2014-07-18 NOTE — Progress Notes (Signed)
Pre visit review using our clinic review tool, if applicable. No additional management support is needed unless otherwise documented below in the visit note. 

## 2014-07-19 ENCOUNTER — Telehealth: Payer: Self-pay | Admitting: Medical

## 2014-07-19 ENCOUNTER — Telehealth: Payer: Self-pay | Admitting: Family

## 2014-07-19 DIAGNOSIS — D696 Thrombocytopenia, unspecified: Secondary | ICD-10-CM

## 2014-07-19 NOTE — Telephone Encounter (Signed)
Refer to hematologist. Low plateletes.

## 2014-07-19 NOTE — Telephone Encounter (Signed)
Called pt informed of appt on 6/21 at 8am, pt is aware of appt.

## 2014-07-22 ENCOUNTER — Telehealth: Payer: Self-pay | Admitting: Medical

## 2014-07-22 NOTE — Telephone Encounter (Signed)
Caller name:Janna Klos Relationship to patient:self Can be reached:[phone] Pharmacy:  Reason for call:her toe is feeling better/pain is goine

## 2014-08-01 ENCOUNTER — Ambulatory Visit: Payer: Medicare Other | Admitting: Medical

## 2014-08-05 ENCOUNTER — Ambulatory Visit (INDEPENDENT_AMBULATORY_CARE_PROVIDER_SITE_OTHER): Payer: Medicare Other | Admitting: Medical

## 2014-08-05 ENCOUNTER — Other Ambulatory Visit: Payer: Self-pay

## 2014-08-05 ENCOUNTER — Encounter: Payer: Self-pay | Admitting: Medical

## 2014-08-05 VITALS — BP 128/70 | HR 95 | Temp 98.4°F | Ht 62.5 in | Wt 203.2 lb

## 2014-08-05 DIAGNOSIS — M1000 Idiopathic gout, unspecified site: Secondary | ICD-10-CM | POA: Diagnosis not present

## 2014-08-05 DIAGNOSIS — R05 Cough: Secondary | ICD-10-CM | POA: Diagnosis not present

## 2014-08-05 DIAGNOSIS — R059 Cough, unspecified: Secondary | ICD-10-CM | POA: Insufficient documentation

## 2014-08-05 DIAGNOSIS — M109 Gout, unspecified: Secondary | ICD-10-CM | POA: Insufficient documentation

## 2014-08-05 MED ORDER — LORATADINE 10 MG PO TABS: 10.0000 mg | ORAL_TABLET | Freq: Every day | ORAL | Status: DC

## 2014-08-05 MED ORDER — SERTRALINE HCL 50 MG PO TABS: 50.0000 mg | ORAL_TABLET | Freq: Every day | ORAL | Status: DC

## 2014-08-05 MED ORDER — BENZONATATE 100 MG PO CAPS: 100.0000 mg | ORAL_CAPSULE | Freq: Three times a day (TID) | ORAL | Status: DC | PRN

## 2014-08-05 NOTE — Progress Notes (Signed)
Subjective:    Patient ID: Alexis [name], female    DOB: April 07, 1950, 64 y.o.   MRN: [id number]  HPI   Pt in for follow up. Pt states her foot feels good. Pt rt great toe was hurting. She felt like got better with ibuprofen 200 mg tid. Uric acid was 7.4. Pt cr  And gfr were controlled/good.  Pt today is more sure that her kidney issues acute renal failure was with indocin and not allopurinol.  Pt states it took about 5 days of ibuprofen to resolve her toe pain. Pt also speculated that may have had problem with colchicine.   Week of dry cough, pnd and some sneezing. She take flonase but this persists.   Mood some depressed with mom health problems. And she used to be on sertraline but her prior MD did not refill. Pt had no side effects now wants to restart.  Review of Systems  Constitutional: Negative for fever, chills and fatigue.  HENT: Positive for postnasal drip and sneezing. Negative for congestion.   Respiratory: Positive for cough. Negative for chest tightness, shortness of breath and wheezing.   Cardiovascular: Negative for chest pain and palpitations.  Gastrointestinal: Negative.   Musculoskeletal:       Rt great toe pain resolved.  Neurological: Negative for dizziness and headaches.  Hematological: Negative for adenopathy. Does not bruise/bleed easily.  Psychiatric/Behavioral: Negative for behavioral problems and confusion.     Past Medical History  Diagnosis Date  . CHF (congestive heart failure)   . Diabetes mellitus without complication   . Atrial fibrillation   . Thyroid disease   . Allergy   . Anxiety   . Arthritis   . Asthma   . COPD (chronic obstructive pulmonary disease)   . Hyperlipidemia   . Hypertension   . Myocardial infarction     History   Social History  . Marital Status: Married    Spouse Name: N/A  . Number of Children: N/A  . Years of Education: N/A   Occupational History  . Not on file.   Social History Main Topics  . Smoking  status: Former Smoker    Quit date: 03/01/1998  . Smokeless tobacco: Never Used  . Alcohol Use: No  . Drug Use: No  . Sexual Activity: No   Other Topics Concern  . Not on file   Social History Narrative    Past Surgical History  Procedure Laterality Date  . Total knee arthroplasty    . Coronary artery bypass graft      Family History  Problem Relation Age of Onset  . Diabetes Mother   . Hyperlipidemia Mother   . Diabetes Father   . Hyperlipidemia Father   . Heart disease Father     before age 51  . Heart attack Father   . Diabetes Brother   . Hyperlipidemia Brother   . Heart attack Brother     Allergies  Allergen Reactions  . Sulfa Antibiotics Hives    Current Outpatient Prescriptions on File Prior to Visit  Medication Sig Dispense Refill  . Apixaban (ELIQUIS PO) Take by mouth daily.    Marland Kitchen atorvastatin (LIPITOR) 10 MG tablet Take 10 mg by mouth daily.    . baclofen (LIORESAL) 10 MG tablet Take 10 mg by mouth 3 (three) times daily.    . cloNIDine (CATAPRES) 0.1 MG tablet Take 0.1 mg by mouth 2 (two) times daily.    Marland Kitchen levothyroxine (SYNTHROID, LEVOTHROID) 175 MCG tablet Take 175  mcg by mouth daily before breakfast.    . metoprolol succinate (TOPROL-XL) 25 MG 24 hr tablet Take 25 mg by mouth daily.    . Prenatal Vit-Fe Fumarate-FA (MULTIVITAMIN-PRENATAL) 27-0.8 MG TABS tablet Take 1 tablet by mouth daily at 12 noon. Takes for nail and hair growth    . sertraline (ZOLOFT) 100 MG tablet Take 100 mg by mouth daily.    Marland Kitchen spironolactone (ALDACTONE) 25 MG tablet Take 25 mg by mouth daily.    Marland Kitchen torsemide (DEMADEX) 20 MG tablet Take 20 mg by mouth daily. 1/2 tablet daily     No current facility-administered medications on file prior to visit.    BP 128/70 mmHg  Pulse 95  Temp(Src) 98.4 F (36.9 C) (Oral)  Ht 5' 2.5" (1.588 m)  Wt 203 lb 3.2 oz (92.171 kg)  BMI 36.55 kg/m2  SpO2 97%       Objective:   Physical Exam   General  Mental Status - Alert. General  Appearance - Well groomed. Not in acute distress.  Skin Rashes- No Rashes.  HEENT Head- Normal. Ear Auditory Canal - Left- Normal. Right - Normal.Tympanic Membrane- Left- Normal. Right- Normal. Eye Sclera/Conjunctiva- Left- Normal. Right- Normal. Nose & Sinuses Nasal Mucosa- Left-  Boggy and Congested. Right-  Boggy and  Congested.Bilateral no maxillary and no frontal sinus pressure. Mouth & Throat Lips: Upper Lip- Normal: no dryness, cracking, pallor, cyanosis, or vesicular eruption. Lower Lip-Normal: no dryness, cracking, pallor, cyanosis or vesicular eruption. Buccal Mucosa- Bilateral- No Aphthous ulcers. Oropharynx- No Discharge or Erythema. +pnd. Tonsils: Characteristics- Bilateral- No Erythema or Congestion. Size/Enlargement- Bilateral- No enlargement. Discharge- bilateral-None.  Neck Neck- Supple. No Masses.   Chest and Lung Exam Auscultation: Breath Sounds:-Clear even and unlabored.  Cardiovascular Auscultation:Rythm- Regular, rate and rhythm. Murmurs & Other Heart Sounds:Ausculatation of the heart reveal- No Murmurs.  Lymphatic Head & Neck General Head & Neck Lymphatics: Bilateral: Description- No Localized lymphadenopathy.  Rt foot- no redness, no swelling not tender. Particular over rt great toe and joint.      Assessment & Plan:

## 2014-08-05 NOTE — Patient Instructions (Addendum)
Gout Will get uric acid and cmp today. See if still elevated uric acid. Clinically better. Would consider preventative medication if uric acid is higher. But if I were to write allopurinol in future would want to check bmp one 1 wk after she were to start.   Cough I think allergic rhinitis related. Rx claritin and benzonatate. Continue flonase. If cough persists notify us.    Follow up 1 month or as needed. Pleas come in fasting.

## 2014-08-05 NOTE — Assessment & Plan Note (Signed)
I think allergic rhinitis related. Rx claritin and benzonatate. Continue flonase. If cough persists notify us.

## 2014-08-05 NOTE — Progress Notes (Signed)
Pre visit review using our clinic review tool, if applicable. No additional management support is needed unless otherwise documented below in the visit note. 

## 2014-08-05 NOTE — Assessment & Plan Note (Signed)
Will get uric acid and cmp today. See if still elevated uric acid. Clinically better. Would consider preventative medication if uric acid is higher. But if I were to write allopurinol in future would want to check bmp one 1 wk after she were to start.

## 2014-08-06 ENCOUNTER — Telehealth: Payer: Self-pay | Admitting: Medical

## 2014-08-06 DIAGNOSIS — M1000 Idiopathic gout, unspecified site: Secondary | ICD-10-CM

## 2014-08-06 LAB — COMPREHENSIVE METABOLIC PANEL
ALK PHOS: 145 U/L — AB (ref 39–117)
ALT: 50 U/L — AB (ref 0–35)
AST: 51 U/L — ABNORMAL HIGH (ref 0–37)
Albumin: 4.3 g/dL (ref 3.5–5.2)
BUN: 29 mg/dL — ABNORMAL HIGH (ref 6–23)
CHLORIDE: 96 meq/L (ref 96–112)
CO2: 31 meq/L (ref 19–32)
Calcium: 9.9 mg/dL (ref 8.4–10.5)
Creatinine, Ser: 0.83 mg/dL (ref 0.40–1.20)
GFR: 73.22 mL/min (ref >60.00)
Glucose, Bld: 174 mg/dL — ABNORMAL HIGH (ref 70–99)
POTASSIUM: 3.7 meq/L (ref 3.5–5.1)
Sodium: 134 mEq/L — ABNORMAL LOW (ref 135–145)
Total Bilirubin: 0.7 mg/dL (ref 0.2–1.2)
Total Protein: 7.9 g/dL (ref 6.0–8.3)

## 2014-08-06 LAB — URIC ACID: Uric Acid, Serum: 7.3 mg/dL — ABNORMAL HIGH (ref 2.4–7.0)

## 2014-08-06 MED ORDER — ALLOPURINOL 100 MG PO TABS: 100.0000 mg | ORAL_TABLET | Freq: Every day | ORAL | Status: DC

## 2014-08-07 NOTE — Telephone Encounter (Signed)
Called patient regarding Allopurinal sent to pharmacy and order for CMP placed in system.

## 2014-08-16 ENCOUNTER — Other Ambulatory Visit (INDEPENDENT_AMBULATORY_CARE_PROVIDER_SITE_OTHER): Payer: Medicare Other

## 2014-08-16 DIAGNOSIS — M1000 Idiopathic gout, unspecified site: Secondary | ICD-10-CM

## 2014-08-16 LAB — COMPREHENSIVE METABOLIC PANEL
ALT: 62 U/L — AB (ref 0–35)
AST: 64 U/L — ABNORMAL HIGH (ref 0–37)
Albumin: 4.3 g/dL (ref 3.5–5.2)
Alkaline Phosphatase: 143 U/L — ABNORMAL HIGH (ref 39–117)
BUN: 29 mg/dL — ABNORMAL HIGH (ref 6–23)
CALCIUM: 10.1 mg/dL (ref 8.4–10.5)
CHLORIDE: 93 meq/L — AB (ref 96–112)
CO2: 29 meq/L (ref 19–32)
Creatinine, Ser: 0.8 mg/dL (ref 0.40–1.20)
GFR: 76.39 mL/min (ref >60.00)
Glucose, Bld: 196 mg/dL — ABNORMAL HIGH (ref 70–99)
POTASSIUM: 4.8 meq/L (ref 3.5–5.1)
SODIUM: 130 meq/L — AB (ref 135–145)
TOTAL PROTEIN: 7.9 g/dL (ref 6.0–8.3)
Total Bilirubin: 1 mg/dL (ref 0.2–1.2)

## 2014-08-19 ENCOUNTER — Telehealth: Payer: Self-pay | Admitting: Hematology & Oncology

## 2014-08-19 ENCOUNTER — Other Ambulatory Visit: Payer: Self-pay | Admitting: *Deleted

## 2014-08-19 NOTE — Telephone Encounter (Signed)
Pt wants to cancel her new pt appt  6/21 @ 8a, until she gets her mother out of rehab and gets dependable transportation. Will cb to reschedule.

## 2014-08-20 ENCOUNTER — Ambulatory Visit: Payer: Medicare Other

## 2014-08-20 ENCOUNTER — Other Ambulatory Visit: Payer: Medicare Other

## 2014-08-20 ENCOUNTER — Ambulatory Visit: Payer: Medicare Other | Admitting: Family

## 2014-08-27 ENCOUNTER — Other Ambulatory Visit: Payer: Self-pay

## 2014-08-28 ENCOUNTER — Other Ambulatory Visit: Payer: Self-pay

## 2014-08-28 MED ORDER — ATORVASTATIN CALCIUM 10 MG PO TABS: 10.0000 mg | ORAL_TABLET | Freq: Every day | ORAL | Status: DC

## 2014-09-02 LAB — BASIC METABOLIC PANEL: Glucose: 6 mg/dL

## 2014-09-04 ENCOUNTER — Ambulatory Visit: Payer: Medicare Other | Admitting: Medical

## 2014-09-05 ENCOUNTER — Ambulatory Visit: Payer: Medicare Other | Admitting: Medical

## 2014-09-09 ENCOUNTER — Encounter: Payer: Self-pay | Admitting: Medical

## 2014-09-09 ENCOUNTER — Telehealth: Payer: Self-pay | Admitting: Medical

## 2014-09-09 NOTE — Telephone Encounter (Signed)
Pt was no show 09/05/14 3:45pm, follow up 15 appt, pt has not rescheduled, mailing letter, charge no show fee?

## 2014-09-09 NOTE — Telephone Encounter (Signed)
No charge. 

## 2014-09-10 ENCOUNTER — Other Ambulatory Visit: Payer: Self-pay

## 2014-09-10 MED ORDER — RAMIPRIL 2.5 MG PO CAPS: ORAL_CAPSULE | ORAL | Status: DC

## 2014-10-29 ENCOUNTER — Other Ambulatory Visit: Payer: Self-pay

## 2014-10-29 MED ORDER — METOPROLOL SUCCINATE ER 25 MG PO TB24: 25.0000 mg | ORAL_TABLET | Freq: Every day | ORAL | Status: DC

## 2014-10-29 NOTE — Telephone Encounter (Signed)
rx refill of her bp med.

## 2014-11-23 ENCOUNTER — Other Ambulatory Visit: Payer: Self-pay | Admitting: Medical

## 2014-11-26 NOTE — Telephone Encounter (Signed)
Last filled: 09/10/14 Amt: 30, 2  Too soon to refill.

## 2014-11-27 ENCOUNTER — Other Ambulatory Visit: Payer: Self-pay

## 2014-11-27 MED ORDER — RAMIPRIL 2.5 MG PO CAPS: ORAL_CAPSULE | ORAL | Status: DC

## 2014-11-27 MED ORDER — SPIRONOLACTONE 25 MG PO TABS: 25.0000 mg | ORAL_TABLET | Freq: Every day | ORAL | Status: DC

## 2014-11-27 NOTE — Telephone Encounter (Signed)
I did refill her 2 meds spirinolactone and altace. Let her know very important to check her k level. Both meds can increase k level.  Also will need to check her bp level. Schedule appointment within 2 wks.

## 2014-11-27 NOTE — Telephone Encounter (Signed)
Pt was last seen in 06/2014.  Rx last filled 11/05/2014.  Please advise on refill

## 2014-11-28 NOTE — Telephone Encounter (Signed)
Spoke with pt and she voices understanding. Pt will call back to set up appointment.

## 2014-12-02 ENCOUNTER — Ambulatory Visit (INDEPENDENT_AMBULATORY_CARE_PROVIDER_SITE_OTHER): Payer: Commercial Managed Care - HMO | Admitting: Medical

## 2014-12-02 ENCOUNTER — Encounter: Payer: Self-pay | Admitting: Medical

## 2014-12-02 VITALS — BP 118/80 | HR 86 | Temp 98.0°F | Ht 62.5 in | Wt 216.0 lb

## 2014-12-02 DIAGNOSIS — J3089 Other allergic rhinitis: Secondary | ICD-10-CM

## 2014-12-02 DIAGNOSIS — R062 Wheezing: Secondary | ICD-10-CM

## 2014-12-02 MED ORDER — BECLOMETHASONE DIPROPIONATE 40 MCG/ACT IN AERS: 2.0000 | INHALATION_SPRAY | Freq: Two times a day (BID) | RESPIRATORY_TRACT | Status: DC

## 2014-12-02 MED ORDER — AZITHROMYCIN 250 MG PO TABS: ORAL_TABLET | ORAL | Status: DC

## 2014-12-02 MED ORDER — PREDNISONE 20 MG PO TABS: ORAL_TABLET | ORAL | Status: DC

## 2014-12-02 NOTE — Patient Instructions (Addendum)
Will add qvar to use daily. Continue albuterol. But if having to use every 6-8 hours then will give brief 3 day course of prednisone. If your bs increased then your sliding scale of humalog accordingly while on the prednisone.  If infectious symptoms such as productive cough or fever start azithromycin.  For nasal congestion and sneezing could use flonase and claritin otc.  Follow up in 7-10 days or as needed

## 2014-12-02 NOTE — Progress Notes (Signed)
Pre visit review using our clinic review tool, if applicable. No additional management support is needed unless otherwise documented below in the visit note. 

## 2014-12-02 NOTE — Progress Notes (Signed)
Subjective:    Patient ID: Alexis [name], female    DOB: 05-14-1950, 64 y.o.   MRN: [id number]  HPI  Pt in with coughing just recently. Pt has been using her albuterol q 8 hours. It makes her feel nervous. Pt states hx of asthma. Will get flares in fall and spring   Pt has some runny nose. Some clear nasal drainage. When she lies flat on her back dry cough.  Symptoms started about 10 days ago. Some mild rt ear pain and st. She reports a lot of wheezing. Pt trying claritin for cough.  Pt has been wheezing. Cough is worse at night.  Wheezing and sob restarted about 5 days ago.   Pt has diabetes. She states last a1-c was 6.  3 months ago.    Review of Systems  Constitutional: Negative for fever, chills and fatigue.  HENT: Positive for congestion and rhinorrhea.   Respiratory: Positive for cough and wheezing. Negative for chest tightness and shortness of breath.   Cardiovascular: Negative for chest pain and palpitations.  Musculoskeletal: Negative for back pain.  Neurological: Negative for dizziness and headaches.  Hematological: Negative for adenopathy. Does not bruise/bleed easily.  Psychiatric/Behavioral: Negative for behavioral problems and confusion.    Past Medical History  Diagnosis Date  . CHF (congestive heart failure) ([place])   . Diabetes mellitus without complication ([place])   . Atrial fibrillation ([place])   . Thyroid disease   . Allergy   . Anxiety   . Arthritis   . Asthma   . COPD (chronic obstructive pulmonary disease) ([place])   . Hyperlipidemia   . Hypertension   . Myocardial infarction [facility])     Social History   Social History  . Marital Status: Married    Spouse Name: N/A  . Number of Children: N/A  . Years of Education: N/A   Occupational History  . Not on file.   Social History Main Topics  . Smoking status: Former Smoker    Quit date: 03/01/1998  . Smokeless tobacco: Never Used  . Alcohol Use: No  . Drug Use: No  . Sexual Activity: No    Other Topics Concern  . Not on file   Social History Narrative    Past Surgical History  Procedure Laterality Date  . Total knee arthroplasty    . Coronary artery bypass graft      Family History  Problem Relation Age of Onset  . Diabetes Mother   . Hyperlipidemia Mother   . Diabetes Father   . Hyperlipidemia Father   . Heart disease Father     before age 35  . Heart attack Father   . Diabetes Brother   . Hyperlipidemia Brother   . Heart attack Brother     Allergies  Allergen Reactions  . Sulfa Antibiotics Hives  . Pregabalin     Other reaction(s): DIZZINESS    Current Outpatient Prescriptions on File Prior to Visit  Medication Sig Dispense Refill  . allopurinol (ZYLOPRIM) 100 MG tablet Take 1 tablet (100 mg total) by mouth daily. 30 tablet 3  . Apixaban (ELIQUIS PO) Take by mouth daily.    Marland Kitchen atorvastatin (LIPITOR) 10 MG tablet Take 1 tablet (10 mg total) by mouth daily. 30 tablet 0  . baclofen (LIORESAL) 10 MG tablet Take 10 mg by mouth 3 (three) times daily.    . cloNIDine (CATAPRES) 0.1 MG tablet Take 0.1 mg by mouth 2 (two) times daily.    Marland Kitchen levothyroxine (SYNTHROID,  LEVOTHROID) 175 MCG tablet Take 175 mcg by mouth daily before breakfast.    . loratadine (CLARITIN) 10 MG tablet Take 1 tablet (10 mg total) by mouth daily. 30 tablet 0  . metoprolol succinate (TOPROL-XL) 25 MG 24 hr tablet Take 1 tablet (25 mg total) by mouth daily. 30 tablet 5  . Prenatal Vit-Fe Fumarate-FA (MULTIVITAMIN-PRENATAL) 27-0.8 MG TABS tablet Take 1 tablet by mouth daily at 12 noon. Takes for nail and hair growth    . ramipril (ALTACE) 2.5 MG capsule TAKE 1 CAPSULE BY MOUTH ONCE DAILY. 30 capsule 1  . sertraline (ZOLOFT) 50 MG tablet Take 1 tablet (50 mg total) by mouth daily. 30 tablet 1  . spironolactone (ALDACTONE) 25 MG tablet Take 1 tablet (25 mg total) by mouth daily. 30 tablet 1  . torsemide (DEMADEX) 20 MG tablet Take 20 mg by mouth daily. 1/2 tablet daily     No current  facility-administered medications on file prior to visit.    BP 118/80 mmHg  Pulse 86  Temp(Src) 98 F (36.7 C) (Oral)  Ht 5' 2.5" (1.588 m)  Wt 216 lb (97.977 kg)  BMI 38.85 kg/m2  SpO2 98%       Objective:   Physical Exam  General  Mental Status - Alert. General Appearance - Well groomed. Not in acute distress.  Skin Rashes- No Rashes.  HEENT Head- Normal. Ear Auditory Canal - Left- Normal. Right - Normal.Tympanic Membrane- Left- Normal. Right- Normal. Eye Sclera/Conjunctiva- Left- Normal. Right- Normal. Nose & Sinuses Nasal Mucosa- Left-  Boggy and Congested. Right-  Boggy and  Congested .Bilateral maxillary and frontal sinus pressure. Mouth & Throat Lips: Upper Lip- Normal: no dryness, cracking, pallor, cyanosis, or vesicular eruption. Lower Lip-Normal: no dryness, cracking, pallor, cyanosis or vesicular eruption. Buccal Mucosa- Bilateral- No Aphthous ulcers. Oropharynx- No Discharge or Erythema. +pnd. Tonsils: Characteristics- Bilateral- No Erythema or Congestion. Size/Enlargement- Bilateral- No enlargement. Discharge- bilateral-None.  Neck Neck- Supple. No Masses.   Chest and Lung Exam Auscultation: Breath Sounds:-Clear even and unlabored.  Cardiovascular Auscultation:Rythm- Regular, rate and rhythm. Murmurs & Other Heart Sounds:Ausculatation of the heart reveal- No Murmurs.  Lymphatic Head & Neck General Head & Neck Lymphatics: Bilateral: Description- No Localized lymphadenopathy.       Assessment & Plan:  Will add qvar to use daily. Continue albuterol. But if having to use  Albuterol every 6-8 hours then will give brief 3 day course of prednisone. If your bs increased then your sliding  scale of humalog accordingly while on the prednisone.  If infectious symptoms such as productive cough or fever start azithromycin.  For nasal congestion and sneezing could use flonase and claritin otc.  Follow up in 7-10 days or as needed

## 2014-12-10 ENCOUNTER — Encounter: Payer: Self-pay | Admitting: Medical

## 2014-12-10 NOTE — Progress Notes (Signed)
This encounter was created in error - please disregard.

## 2014-12-23 ENCOUNTER — Other Ambulatory Visit: Payer: Self-pay | Admitting: Medical

## 2014-12-26 DIAGNOSIS — G90522 Complex regional pain syndrome I of left lower limb: Secondary | ICD-10-CM | POA: Diagnosis not present

## 2014-12-26 DIAGNOSIS — R2689 Other abnormalities of gait and mobility: Secondary | ICD-10-CM | POA: Diagnosis not present

## 2014-12-27 DIAGNOSIS — E1142 Type 2 diabetes mellitus with diabetic polyneuropathy: Secondary | ICD-10-CM | POA: Diagnosis not present

## 2014-12-27 DIAGNOSIS — E890 Postprocedural hypothyroidism: Secondary | ICD-10-CM | POA: Diagnosis not present

## 2014-12-27 DIAGNOSIS — I10 Essential (primary) hypertension: Secondary | ICD-10-CM | POA: Diagnosis not present

## 2014-12-27 DIAGNOSIS — E1149 Type 2 diabetes mellitus with other diabetic neurological complication: Secondary | ICD-10-CM | POA: Diagnosis not present

## 2014-12-27 DIAGNOSIS — E7800 Pure hypercholesterolemia, unspecified: Secondary | ICD-10-CM | POA: Diagnosis not present

## 2014-12-27 DIAGNOSIS — E669 Obesity, unspecified: Secondary | ICD-10-CM | POA: Diagnosis not present

## 2014-12-31 ENCOUNTER — Other Ambulatory Visit: Payer: Self-pay

## 2014-12-31 MED ORDER — SERTRALINE HCL 50 MG PO TABS: 50.0000 mg | ORAL_TABLET | Freq: Every day | ORAL | Status: DC

## 2014-12-31 NOTE — Telephone Encounter (Signed)
Pt last OV 12/02/14, Last refill 10/15/14. Please advise on refill.

## 2014-12-31 NOTE — Telephone Encounter (Signed)
Refilled her sertraloine

## 2015-01-09 ENCOUNTER — Ambulatory Visit (HOSPITAL_BASED_OUTPATIENT_CLINIC_OR_DEPARTMENT_OTHER)
Admission: RE | Admit: 2015-01-09 | Discharge: 2015-01-09 | Disposition: A | Discharge status: Home / Self Care | Payer: Commercial Managed Care - HMO | Source: Ambulatory Visit | Attending: Medical | Admitting: Medical

## 2015-01-09 ENCOUNTER — Encounter: Payer: Self-pay | Admitting: Medical

## 2015-01-09 ENCOUNTER — Ambulatory Visit (INDEPENDENT_AMBULATORY_CARE_PROVIDER_SITE_OTHER): Payer: Commercial Managed Care - HMO | Admitting: Medical

## 2015-01-09 ENCOUNTER — Other Ambulatory Visit: Payer: Self-pay

## 2015-01-09 VITALS — BP 120/80 | HR 78 | Temp 98.0°F | Ht 62.5 in | Wt 210.0 lb

## 2015-01-09 DIAGNOSIS — Z139 Encounter for screening, unspecified: Secondary | ICD-10-CM

## 2015-01-09 DIAGNOSIS — Z23 Encounter for immunization: Secondary | ICD-10-CM

## 2015-01-09 DIAGNOSIS — M7732 Calcaneal spur, left foot: Secondary | ICD-10-CM | POA: Insufficient documentation

## 2015-01-09 DIAGNOSIS — M79672 Pain in left foot: Secondary | ICD-10-CM | POA: Diagnosis not present

## 2015-01-09 MED ORDER — RAMIPRIL 2.5 MG PO CAPS
ORAL_CAPSULE | ORAL | Status: DC
Start: 2015-01-09 — End: 2015-07-23

## 2015-01-09 MED ORDER — ALLOPURINOL 100 MG PO TABS: 100.0000 mg | ORAL_TABLET | Freq: Every day | ORAL | Status: DC

## 2015-01-09 MED ORDER — SPIRONOLACTONE 25 MG PO TABS: 25.0000 mg | ORAL_TABLET | Freq: Every day | ORAL | Status: DC

## 2015-01-09 MED ORDER — TRAMADOL HCL 50 MG PO TABS: 50.0000 mg | ORAL_TABLET | Freq: Four times a day (QID) | ORAL | Status: DC | PRN

## 2015-01-09 NOTE — Progress Notes (Signed)
Pre visit review using our clinic review tool, if applicable. No additional management support is needed unless otherwise documented below in the visit note. 

## 2015-01-09 NOTE — Progress Notes (Signed)
   Subjective:    Patient ID: Alexis [name], female    DOB: 06/12/1950, 64 y.o.   MRN: [id number]  HPI   Pt in for some left heel pain since Tuesday. Pt has hx of rt side plantar fascitis of rt foot. She states it feels very similar. Pain present all day long. No trauma or fall.   Review of Systems  Constitutional: Negative for fever, chills and fatigue.  Respiratory: Negative for cough, chest tightness, shortness of breath and wheezing.   Cardiovascular: Negative for chest pain and palpitations.  Musculoskeletal:       Lt foot/heel pain.       Objective:   Physical Exam  General- No acute distress. Pleasant patient. Neck- Full range of motion, no jvd Lungs- Clear, even and unlabored. Heart- regular rate and rhythm. Neurologic- CNII- XII grossly intact.  Rt foot- pain on palpation of the rt heel. Also some posterior aspect pain where achilles tendon inserts into posterior heel.      Assessment & Plan:  For your heel pain will advise tylenol and  Dr. Darrick Grinder heel pads.(unfortunatley cardiologist advise against any nsaids) Will rx tramadol for pain. Use if needed.  Will get xrays of foot today.   Follow up in 10 days or as needed.

## 2015-01-09 NOTE — Patient Instructions (Addendum)
For your heel pain will advise tylenol and  Dr. Darrick Grinder heel pads.(unfortunatley cardiologist advise against any nsaids) Will rx tramadol for pain. Use if needed. Rx advisement given.  Will get xrays of foot today.   Follow up in 10 days or as needed.

## 2015-01-21 DIAGNOSIS — R2689 Other abnormalities of gait and mobility: Secondary | ICD-10-CM | POA: Diagnosis not present

## 2015-01-21 DIAGNOSIS — R42 Dizziness and giddiness: Secondary | ICD-10-CM | POA: Diagnosis not present

## 2015-01-21 DIAGNOSIS — R296 Repeated falls: Secondary | ICD-10-CM | POA: Diagnosis not present

## 2015-01-21 DIAGNOSIS — M6281 Muscle weakness (generalized): Secondary | ICD-10-CM | POA: Diagnosis not present

## 2015-01-27 ENCOUNTER — Encounter: Payer: Self-pay | Admitting: Physician Assistant

## 2015-01-27 ENCOUNTER — Ambulatory Visit (INDEPENDENT_AMBULATORY_CARE_PROVIDER_SITE_OTHER): Payer: Commercial Managed Care - HMO | Admitting: Physician Assistant

## 2015-01-27 VITALS — BP 119/58 | HR 80 | Temp 98.3°F | Resp 16 | Ht 62.5 in | Wt 216.4 lb

## 2015-01-27 DIAGNOSIS — J208 Acute bronchitis due to other specified organisms: Principal | ICD-10-CM

## 2015-01-27 DIAGNOSIS — B9689 Other specified bacterial agents as the cause of diseases classified elsewhere: Secondary | ICD-10-CM | POA: Insufficient documentation

## 2015-01-27 DIAGNOSIS — J00 Acute nasopharyngitis [common cold]: Secondary | ICD-10-CM

## 2015-01-27 MED ORDER — BECLOMETHASONE DIPROPIONATE 40 MCG/ACT IN AERS: 2.0000 | INHALATION_SPRAY | Freq: Two times a day (BID) | RESPIRATORY_TRACT | Status: DC

## 2015-01-27 MED ORDER — AZITHROMYCIN 250 MG PO TABS: ORAL_TABLET | ORAL | Status: DC

## 2015-01-27 NOTE — Assessment & Plan Note (Signed)
Rx Azithromycin.  Increase fluids.  Rest.  Saline nasal spray.  Probiotic.  Mucinex-DM as directed.  Humidifier in bedroom.  Call or return to clinic if symptoms are not improving.

## 2015-01-27 NOTE — Progress Notes (Signed)
Patient presents to clinic today c/o 1 week of chest congestion, productive cough, sore throat and fatigue that has been worsening over the past 3 days. Endorses aching and chills without fever. Endorses voice hoarseness. Patient with history of COPD, currently taking Qvar as directed. Denies SOB unless coughing.  Past Medical History  Diagnosis Date  . CHF (congestive heart failure) ([place])   . Diabetes mellitus without complication ([place])   . Atrial fibrillation ([place])   . Thyroid disease   . Allergy   . Anxiety   . Arthritis   . Asthma   . COPD (chronic obstructive pulmonary disease) ([place])   . Hyperlipidemia   . Hypertension   . Myocardial infarction [facility])     Current Outpatient Prescriptions on File Prior to Visit  Medication Sig Dispense Refill  . allopurinol (ZYLOPRIM) 100 MG tablet Take 1 tablet (100 mg total) by mouth daily. 30 tablet 5  . Apixaban (ELIQUIS PO) Take by mouth 2 (two) times daily.     Marland Kitchen atorvastatin (LIPITOR) 10 MG tablet Take 1 tablet (10 mg total) by mouth daily. 30 tablet 0  . cloNIDine (CATAPRES) 0.1 MG tablet Take 0.1 mg by mouth 2 (two) times daily.    Marland Kitchen levothyroxine (SYNTHROID, LEVOTHROID) 175 MCG tablet Take 175 mcg by mouth daily before breakfast.    . loratadine (CLARITIN) 10 MG tablet Take 1 tablet (10 mg total) by mouth daily. 30 tablet 0  . metoprolol succinate (TOPROL-XL) 25 MG 24 hr tablet Take 1 tablet (25 mg total) by mouth daily. 30 tablet 5  . Prenatal Vit-Fe Fumarate-FA (MULTIVITAMIN-PRENATAL) 27-0.8 MG TABS tablet Take 1 tablet by mouth daily at 12 noon. Takes for nail and hair growth    . ramipril (ALTACE) 2.5 MG capsule TAKE 1 CAPSULE BY MOUTH ONCE DAILY. 30 capsule 5  . sertraline (ZOLOFT) 50 MG tablet Take 1 tablet (50 mg total) by mouth daily. 30 tablet 1  . spironolactone (ALDACTONE) 25 MG tablet Take 1 tablet (25 mg total) by mouth daily. 30 tablet 5  . torsemide (DEMADEX) 20 MG tablet Take 20 mg by mouth daily.     . traMADol  (ULTRAM) 50 MG tablet Take 1 tablet (50 mg total) by mouth every 6 (six) hours as needed. 16 tablet 0   No current facility-administered medications on file prior to visit.    Allergies  Allergen Reactions  . Sulfa Antibiotics Hives  . Pregabalin     Other reaction(s): DIZZINESS    Family History  Problem Relation Age of Onset  . Diabetes Mother   . Hyperlipidemia Mother   . Diabetes Father   . Hyperlipidemia Father   . Heart disease Father     before age 59  . Heart attack Father   . Diabetes Brother   . Hyperlipidemia Brother   . Heart attack Brother     Social History   Social History  . Marital Status: Married    Spouse Name: N/A  . Number of Children: N/A  . Years of Education: N/A   Social History Main Topics  . Smoking status: Former Smoker    Quit date: 03/01/1998  . Smokeless tobacco: Never Used  . Alcohol Use: No  . Drug Use: No  . Sexual Activity: No   Other Topics Concern  . Not on file   Social History Narrative   Review of Systems - See HPI.  All other ROS are negative.  BP 119/58 mmHg  Pulse 80  Temp(Src) 98.3  F (36.8 C) (Oral)  Resp 16  Ht 5' 2.5" (1.588 m)  Wt 216 lb 6 oz (98.147 kg)  BMI 38.92 kg/m2  SpO2 99%  LMP   Physical Exam  Constitutional: She is oriented to person, place, and time and well-developed, well-nourished, and in no distress.  HENT:  Head: Normocephalic and atraumatic.  Right Ear: External ear normal.  Left Ear: External ear normal.  Nose: Nose normal.  Mouth/Throat: Oropharynx is clear and moist. No oropharyngeal exudate.  TM within normal limits.   Eyes: Conjunctivae are normal.  Neck: Neck supple.  Cardiovascular: Normal rate, regular rhythm, normal heart sounds and intact distal pulses.   Pulmonary/Chest: Effort normal and breath sounds normal. No respiratory distress. She has no wheezes. She has no rales. She exhibits no tenderness.  Neurological: She is alert and oriented to person, place, and time.    Skin: Skin is warm and dry. No rash noted.  Psychiatric: Affect normal.  Vitals reviewed.   No results found for this or any previous visit (from the past 2160 hour(s)).  Assessment/Plan: Acute bacterial bronchitis Rx Azithromycin.  Increase fluids.  Rest.  Saline nasal spray.  Probiotic.  Mucinex-DM as directed.  Humidifier in bedroom.  Call or return to clinic if symptoms are not improving.

## 2015-01-27 NOTE — Progress Notes (Signed)
Pre visit review using our clinic review tool, if applicable. No additional management support is needed unless otherwise documented below in the visit note/SLS  

## 2015-01-27 NOTE — Patient Instructions (Signed)
Take antibiotic (Azithromycin) as directed.  Increase fluids.  Get plenty of rest. Use Mucinex-DM for congestion and cough. Restart your Qvar daily. Take a daily probiotic (I recommend Align or Culturelle, but even Activia Yogurt may be beneficial).  A humidifier placed in the bedroom may offer some relief for a dry, scratchy throat of nasal irritation.  Read information below on acute bronchitis. Please call or return to clinic if symptoms are not improving.  Acute Bronchitis Bronchitis is when the airways that extend from the windpipe into the lungs get red, puffy, and painful (inflamed). Bronchitis often causes thick spit (mucus) to develop. This leads to a cough. A cough is the most common symptom of bronchitis. In acute bronchitis, the condition usually begins suddenly and goes away over time (usually in 2 weeks). Smoking, allergies, and asthma can make bronchitis worse. Repeated episodes of bronchitis may cause more lung problems.  HOME CARE  Rest.  Drink enough fluids to keep your pee (urine) clear or pale yellow (unless you need to limit fluids as told by your doctor).  Only take over-the-counter or prescription medicines as told by your doctor.  Avoid smoking and secondhand smoke. These can make bronchitis worse. If you are a smoker, think about using nicotine gum or skin patches. Quitting smoking will help your lungs heal faster.  Reduce the chance of getting bronchitis again by:  Washing your hands often.  Avoiding people with cold symptoms.  Trying not to touch your hands to your mouth, nose, or eyes.  Follow up with your doctor as told.  GET HELP IF: Your symptoms do not improve after 1 week of treatment. Symptoms include:  Cough.  Fever.  Coughing up thick spit.  Body aches.  Chest congestion.  Chills.  Shortness of breath.  Sore throat.  GET HELP RIGHT AWAY IF:   You have an increased fever.  You have chills.  You have severe shortness of  breath.  You have bloody thick spit (sputum).  You throw up (vomit) often.  You lose too much body fluid (dehydration).  You have a severe headache.  You faint.  MAKE SURE YOU:   Understand these instructions.  Will watch your condition.  Will get help right away if you are not doing well or get worse. Document Released: 08/04/2007 Document Revised: 10/18/2012 Document Reviewed: 08/08/2012 [facility] Patient Information 2015 [place], [place]. This information is not intended to replace advice given to you by your health care provider. Make sure you discuss any questions you have with your health care provider.

## 2015-01-30 ENCOUNTER — Encounter: Payer: Self-pay | Admitting: Medical

## 2015-01-30 ENCOUNTER — Ambulatory Visit (INDEPENDENT_AMBULATORY_CARE_PROVIDER_SITE_OTHER): Payer: Commercial Managed Care - HMO | Admitting: Medical

## 2015-01-30 VITALS — BP 120/80 | HR 88 | Temp 97.8°F | Ht 62.5 in | Wt 217.0 lb

## 2015-01-30 DIAGNOSIS — J209 Acute bronchitis, unspecified: Secondary | ICD-10-CM

## 2015-01-30 DIAGNOSIS — R062 Wheezing: Secondary | ICD-10-CM

## 2015-01-30 MED ORDER — HYDROCODONE-HOMATROPINE 5-1.5 MG/5ML PO SYRP: 5.0000 mL | ORAL_SOLUTION | Freq: Three times a day (TID) | ORAL | Status: DC | PRN

## 2015-01-30 MED ORDER — CEPHALEXIN 500 MG PO CAPS: 500.0000 mg | ORAL_CAPSULE | Freq: Two times a day (BID) | ORAL | Status: DC

## 2015-01-30 MED ORDER — PREDNISONE 10 MG PO TABS: ORAL_TABLET | ORAL | Status: DC

## 2015-01-30 NOTE — Progress Notes (Signed)
Pre visit review using our clinic review tool, if applicable. No additional management support is needed unless otherwise documented below in the visit note. 

## 2015-01-30 NOTE — Patient Instructions (Addendum)
For your bronchitis. Rx cephalexin. But get probiotic otc while on antibiotic. If diarrhea again then stop since c dif could occur  For your cough hycodan rx.  For wheezing will rx 4 day taper prednisone.  Continue qvar and neb tx.  Please watch your sugar intake while on prednisone. Please sign release form and give staff Dr Rutherford Nail info so we can get diabetic labs done redently.  Follow up in 2 wks or as needed

## 2015-01-30 NOTE — Progress Notes (Signed)
Subjective:    Patient ID: Alexis [name], female    DOB: August 11, 1950, 64 y.o.   MRN: [id number]  HPI   Pt in with cough since Saturday. By Saturday night rattle type cough. Some productive cough. Pt taking neb treatment 3 times a day. Pt felt hot last night. Some sweating. Pt took 3 days of zpack but caused her diarrhea. Pt stopped zpack yesterday. No further diarrhea.  Pt is on qvar and neb tx. Pt not on any cough medicine. Moderate wheeze with cough.  Pt is diabetic. Her last endocrinologist October. Dr. Rutherford Nail.  Last a1-c was 8. She had just been on prednisone. Pt bs was 186 2 hours after she ate yesterday.   Review of Systems  Constitutional: Positive for fever and diaphoresis.  HENT: Negative for congestion, ear pain, postnasal drip and sinus pressure.   Respiratory: Positive for cough and wheezing. Negative for chest tightness.        Some productive cough.  Cardiovascular: Negative for chest pain and palpitations.  Gastrointestinal: Negative for abdominal pain.  Neurological: Negative for dizziness and headaches.  Hematological: Negative for adenopathy. Does not bruise/bleed easily.  Psychiatric/Behavioral: Negative for behavioral problems and confusion.    Past Medical History  Diagnosis Date  . CHF (congestive heart failure) ([place])   . Diabetes mellitus without complication ([place])   . Atrial fibrillation ([place])   . Thyroid disease   . Allergy   . Anxiety   . Arthritis   . Asthma   . COPD (chronic obstructive pulmonary disease) ([place])   . Hyperlipidemia   . Hypertension   . Myocardial infarction [facility])     Social History   Social History  . Marital Status: Married    Spouse Name: N/A  . Number of Children: N/A  . Years of Education: N/A   Occupational History  . Not on file.   Social History Main Topics  . Smoking status: Former Smoker    Quit date: 03/01/1998  . Smokeless tobacco: Never Used  . Alcohol Use: No  . Drug Use: No  . Sexual Activity: No    Other Topics Concern  . Not on file   Social History Narrative    Past Surgical History  Procedure Laterality Date  . Total knee arthroplasty    . Coronary artery bypass graft      Family History  Problem Relation Age of Onset  . Diabetes Mother   . Hyperlipidemia Mother   . Diabetes Father   . Hyperlipidemia Father   . Heart disease Father     before age 29  . Heart attack Father   . Diabetes Brother   . Hyperlipidemia Brother   . Heart attack Brother     Allergies  Allergen Reactions  . Sulfa Antibiotics Hives  . Pregabalin     Other reaction(s): DIZZINESS    Current Outpatient Prescriptions on File Prior to Visit  Medication Sig Dispense Refill  . allopurinol (ZYLOPRIM) 100 MG tablet Take 1 tablet (100 mg total) by mouth daily. 30 tablet 5  . Apixaban (ELIQUIS PO) Take by mouth 2 (two) times daily.     Marland Kitchen atorvastatin (LIPITOR) 10 MG tablet Take 1 tablet (10 mg total) by mouth daily. 30 tablet 0  . beclomethasone (QVAR) 40 MCG/ACT inhaler Inhale 2 puffs into the lungs 2 (two) times daily. 1 Inhaler 3  . cloNIDine (CATAPRES) 0.1 MG tablet Take 0.1 mg by mouth 2 (two) times daily.    Marland Kitchen levothyroxine (SYNTHROID,  LEVOTHROID) 175 MCG tablet Take 175 mcg by mouth daily before breakfast.    . loratadine (CLARITIN) 10 MG tablet Take 1 tablet (10 mg total) by mouth daily. 30 tablet 0  . metoprolol succinate (TOPROL-XL) 25 MG 24 hr tablet Take 1 tablet (25 mg total) by mouth daily. 30 tablet 5  . Prenatal Vit-Fe Fumarate-FA (MULTIVITAMIN-PRENATAL) 27-0.8 MG TABS tablet Take 1 tablet by mouth daily at 12 noon. Takes for nail and hair growth    . ramipril (ALTACE) 2.5 MG capsule TAKE 1 CAPSULE BY MOUTH ONCE DAILY. 30 capsule 5  . sertraline (ZOLOFT) 50 MG tablet Take 1 tablet (50 mg total) by mouth daily. 30 tablet 1  . spironolactone (ALDACTONE) 25 MG tablet Take 1 tablet (25 mg total) by mouth daily. 30 tablet 5  . torsemide (DEMADEX) 20 MG tablet Take 20 mg by mouth daily.      . traMADol (ULTRAM) 50 MG tablet Take 1 tablet (50 mg total) by mouth every 6 (six) hours as needed. 16 tablet 0   No current facility-administered medications on file prior to visit.    BP 120/80 mmHg  Pulse 88  Temp(Src) 97.8 F (36.6 C) (Oral)  Ht 5' 2.5" (1.588 m)  Wt 217 lb (98.431 kg)  BMI 39.03 kg/m2  SpO2 97%       Objective:   Physical Exam  General  Mental Status - Alert. General Appearance - Well groomed. Not in acute distress.  Skin Rashes- No Rashes.  HEENT Head- Normal. Ear Auditory Canal - Left- Normal. Right - Normal.Tympanic Membrane- Left- Normal. Right- Normal. Eye Sclera/Conjunctiva- Left- Normal. Right- Normal. Nose & Sinuses Nasal Mucosa- Left-  Not boggy or Congested. Right-  Not  boggy or Congested. Mouth & Throat Lips: Upper Lip- Normal: no dryness, cracking, pallor, cyanosis, or vesicular eruption. Lower Lip-Normal: no dryness, cracking, pallor, cyanosis or vesicular eruption. Buccal Mucosa- Bilateral- No Aphthous ulcers. Oropharynx- No Discharge or Erythema. Tonsils: Characteristics- Bilateral- No Erythema or Congestion. Size/Enlargement- Bilateral- No enlargement. Discharge- bilateral-None.  Neck Neck- Supple. No Masses.   Chest and Lung Exam Auscultation: Breath Sounds:- even and unlabored, but bilateral upper lobe rhonchi.  Cardiovascular Auscultation:Rythm- Regular, rate and rhythm. Murmurs & Other Heart Sounds:Ausculatation of the heart reveal- No Murmurs.  Lymphatic Head & Neck General Head & Neck Lymphatics: Bilateral: Description- No Localized lymphadenopathy.       Assessment & Plan:  For your bronchitis. Rx cephalexin. But get probiotic otc while on antibiotic. If diarrhea again then stop since c dif could occur  For your cough hycodan rx.  For wheezing will rx 4 day taper prednisone.  Continue qvar and neb tx.  Please watch your sugar intake while on prednisone. Please sign release form and give staff Dr  Rutherford Nail info so we can get diabetic labs done redently.  Follow up in 2 wks or as needed

## 2015-02-20 ENCOUNTER — Telehealth: Payer: Self-pay | Admitting: Behavioral Health

## 2015-02-20 NOTE — Telephone Encounter (Signed)
The following care gaps were assessed: A1C A1C less than 8% Mammogram Colonoscopy  Spoke to patient and she voiced that the earliest she could come in for a physical would be February 2017. Scheduled physical for 04/03/15 at 10:30 AM with PCP.

## 2015-03-04 ENCOUNTER — Telehealth: Payer: Self-pay | Admitting: Medical

## 2015-03-04 NOTE — Telephone Encounter (Signed)
It has been a month since I last saw her for bronchitis. She had rx of hycodan. I can call that in and since already month has transpired since last office visit. She would need an appointment due to hycodan being controlled med. Looks like I have appointment available Friday afternoon.

## 2015-03-04 NOTE — Telephone Encounter (Signed)
Caller name: Self   Can be reached: [phone]  Pharmacy:  [facility], [place] - [id number] [phone] (Phone) [phone] (Fax)         Reason for call: Request refill on HYDROcodone-homatropine (HYCODAN) 5-1.5 MG/5ML syrup [[id number]]

## 2015-03-04 NOTE — Telephone Encounter (Signed)
Alexis [name] please advise on refill.

## 2015-03-05 NOTE — Telephone Encounter (Signed)
Spoke with pt and she voices understanding and she will call back to set up an appointment.

## 2015-03-06 MED ORDER — BENZONATATE 200 MG PO CAPS: 200.0000 mg | ORAL_CAPSULE | Freq: Three times a day (TID) | ORAL | Status: DC | PRN

## 2015-03-06 NOTE — Addendum Note (Signed)
Addended by: Tasia Catchings on: 03/06/2015 02:18 PM   Modules accepted: Orders

## 2015-03-06 NOTE — Telephone Encounter (Signed)
Patient call back this morning stating that she called her pharmacy but no rx for the cough syrup had been called in. Patient scheduled an appt for next Thursday. Plse adv.

## 2015-03-06 NOTE — Telephone Encounter (Signed)
Left message for pt that hycodan could not be sent in to pharmacy being a controlled it can't be called in over the phone. I left her a message that Tessalon Pearls for cough were sent to pharmacy. Advised pt to call back if she has any questions.

## 2015-03-13 ENCOUNTER — Other Ambulatory Visit: Payer: Self-pay

## 2015-03-13 ENCOUNTER — Encounter: Payer: Commercial Managed Care - HMO | Admitting: Medical

## 2015-03-13 MED ORDER — SERTRALINE HCL 50 MG PO TABS: 50.0000 mg | ORAL_TABLET | Freq: Every day | ORAL | Status: DC

## 2015-03-13 NOTE — Telephone Encounter (Signed)
Left message for pt to call back and schedule an appointment with in a month.

## 2015-03-13 NOTE — Telephone Encounter (Signed)
I refilled her sertaline. But only 30 days. Ask her to come in within a month.

## 2015-03-13 NOTE — Progress Notes (Signed)
This encounter was created in error - please disregard.

## 2015-03-13 NOTE — Telephone Encounter (Signed)
Last refill 02/10/15 Last OV 01/30/15. Please advise.

## 2015-03-14 ENCOUNTER — Telehealth: Payer: Self-pay | Admitting: Medical

## 2015-03-14 ENCOUNTER — Encounter: Payer: Self-pay | Admitting: Medical

## 2015-03-14 NOTE — Telephone Encounter (Signed)
Pt was no show 03/13/15 1:00pm appt, pt called 03/13/15 11:24am and rescheduled appt, no reason noted, charge or no charge?

## 2015-03-14 NOTE — Telephone Encounter (Signed)
charge 

## 2015-03-14 NOTE — Telephone Encounter (Signed)
Marked to charge, mailing letter

## 2015-03-20 ENCOUNTER — Ambulatory Visit (INDEPENDENT_AMBULATORY_CARE_PROVIDER_SITE_OTHER): Payer: Commercial Managed Care - HMO | Admitting: Medical

## 2015-03-20 ENCOUNTER — Encounter: Payer: Self-pay | Admitting: Medical

## 2015-03-20 VITALS — BP 110/70 | HR 80 | Temp 98.1°F | Ht 63.0 in | Wt 214.8 lb

## 2015-03-20 DIAGNOSIS — R062 Wheezing: Secondary | ICD-10-CM

## 2015-03-20 DIAGNOSIS — F4323 Adjustment disorder with mixed anxiety and depressed mood: Secondary | ICD-10-CM

## 2015-03-20 DIAGNOSIS — J209 Acute bronchitis, unspecified: Secondary | ICD-10-CM

## 2015-03-20 MED ORDER — BUSPIRONE HCL 7.5 MG PO TABS: ORAL_TABLET | ORAL | Status: DC

## 2015-03-20 NOTE — Progress Notes (Signed)
Pre visit review using our clinic review tool, if applicable. No additional management support is needed unless otherwise documented below in the visit note. 

## 2015-03-20 NOTE — Progress Notes (Signed)
Subjective:    Patient ID: Alexis [name], female    DOB: [birth year], 65 y.o.   MRN: [id number]  HPI   Pt in states she has been dong better. Previous wheezing is now resolved. Bronchitis symptoms now resolved.  Pt states some financial stress recently since death of mom. Pt had been getting some help from her mom in the past before her passing. Pt had friend that loaned her $400. But another bill came in and she started crying. Pt will try to get a job part time.  Depressed since mom passed. She is taking sertraline. Felt better after paying bills yesterday. But when new bill came in she got depressed again.  Pt has [employer]. She states with just follow she pays ten dollars. If any labs drawn she pays $45 dollars. So patient does not want to do labs now.  Pt declines colonoscopy. Pt has care gaps but does not want to get them done now.   Review of Systems  Constitutional: Negative for chills, diaphoresis and fatigue.  Cardiovascular: Negative for chest pain and palpitations.  Musculoskeletal: Negative for back pain and joint swelling.  Neurological: Negative for dizziness, seizures, syncope, light-headedness and numbness.  Hematological: Negative for adenopathy. Does not bruise/bleed easily.  Psychiatric/Behavioral: Positive for dysphoric mood. Negative for suicidal ideas, behavioral problems and confusion. The patient is nervous/anxious.     Past Medical History  Diagnosis Date  . CHF (congestive heart failure) ([place])   . Diabetes mellitus without complication ([place])   . Atrial fibrillation ([place])   . Thyroid disease   . Allergy   . Anxiety   . Arthritis   . Asthma   . COPD (chronic obstructive pulmonary disease) ([place])   . Hyperlipidemia   . Hypertension   . Myocardial infarction [facility])     Social History   Social History  . Marital Status: Married    Spouse Name: N/A  . Number of Children: N/A  . Years of Education: N/A   Occupational History  . Not on  file.   Social History Main Topics  . Smoking status: Former Smoker    Quit date: 03/01/1998  . Smokeless tobacco: Never Used  . Alcohol Use: No  . Drug Use: No  . Sexual Activity: No   Other Topics Concern  . Not on file   Social History Narrative    Past Surgical History  Procedure Laterality Date  . Total knee arthroplasty    . Coronary artery bypass graft      Family History  Problem Relation Age of Onset  . Diabetes Mother   . Hyperlipidemia Mother   . Diabetes Father   . Hyperlipidemia Father   . Heart disease Father     before age 68  . Heart attack Father   . Diabetes Brother   . Hyperlipidemia Brother   . Heart attack Brother     Allergies  Allergen Reactions  . Pregabalin     Other reaction(s): DIZZINESS  . Sulfa Antibiotics Hives    Current Outpatient Prescriptions on File Prior to Visit  Medication Sig Dispense Refill  . allopurinol (ZYLOPRIM) 100 MG tablet Take 1 tablet (100 mg total) by mouth daily. 30 tablet 5  . Apixaban (ELIQUIS PO) Take by mouth 2 (two) times daily.     Marland Kitchen atorvastatin (LIPITOR) 10 MG tablet Take 1 tablet (10 mg total) by mouth daily. 30 tablet 0  . beclomethasone (QVAR) 40 MCG/ACT inhaler Inhale 2 puffs into the lungs  2 (two) times daily. 1 Inhaler 3  . cloNIDine (CATAPRES) 0.1 MG tablet Take 0.1 mg by mouth 2 (two) times daily.    Marland Kitchen levothyroxine (SYNTHROID, LEVOTHROID) 175 MCG tablet Take 175 mcg by mouth daily before breakfast.    . loratadine (CLARITIN) 10 MG tablet Take 1 tablet (10 mg total) by mouth daily. 30 tablet 0  . metoprolol succinate (TOPROL-XL) 25 MG 24 hr tablet Take 1 tablet (25 mg total) by mouth daily. 30 tablet 5  . predniSONE (DELTASONE) 10 MG tablet 4 tab po day 1, then 3 tab po day 2, then 2 day 3, then 1 tab po day 4. 10 tablet 0  . Prenatal Vit-Fe Fumarate-FA (MULTIVITAMIN-PRENATAL) 27-0.8 MG TABS tablet Take 1 tablet by mouth daily at 12 noon. Takes for nail and hair growth    . ramipril (ALTACE) 2.5  MG capsule TAKE 1 CAPSULE BY MOUTH ONCE DAILY. 30 capsule 5  . sertraline (ZOLOFT) 50 MG tablet Take 1 tablet (50 mg total) by mouth daily. 30 tablet 0  . spironolactone (ALDACTONE) 25 MG tablet Take 1 tablet (25 mg total) by mouth daily. 30 tablet 5  . torsemide (DEMADEX) 20 MG tablet Take 20 mg by mouth daily.     . traMADol (ULTRAM) 50 MG tablet Take 1 tablet (50 mg total) by mouth every 6 (six) hours as needed. 16 tablet 0   No current facility-administered medications on file prior to visit.    BP 110/70 mmHg  Pulse 80  Temp(Src) 98.1 F (36.7 C) (Oral)  Ht 5' 3"  (1.6 m)  Wt 214 lb 12.8 oz (97.433 kg)  BMI 38.06 kg/m2  SpO2 97%       Objective:   Physical Exam  General- No acute distress. Pleasant patient. But seems some anxious. Neck- Full range of motion, no jvd Heent- No sinus pressure. Nares patent. Lungs- Clear, even and unlabored. Heart- regular rate and rhythm. Neurologic- CNII- XII grossly intact.       Assessment & Plan:  Your bronchitis and wheezing is now resolved.  For your mood continue sertraline.  For anxiety, I will prescribe buspar.  Follow up 3- 4 weeks for wellness exam. And  with me for physical.(seperate appointment due to time constraints)  Pt does not want to do any blood work today. She does have health maintenance gaps. On her physical exam with me plan to try to close some if she agreeable to. Example refuses colnoscopy various times in past.

## 2015-03-20 NOTE — Patient Instructions (Addendum)
Your bronchitis and wheezing is now resolved.  For your mood continue sertraline.  For anxiety, I will prescribe buspar.  Follow up 3- 4 weeks for wellness exam. And  with me for physical.

## 2015-03-27 ENCOUNTER — Ambulatory Visit (INDEPENDENT_AMBULATORY_CARE_PROVIDER_SITE_OTHER): Payer: Commercial Managed Care - HMO

## 2015-03-27 VITALS — BP 149/84 | HR 80 | Ht 62.0 in | Wt 213.8 lb

## 2015-03-27 DIAGNOSIS — Z0000 Encounter for general adult medical examination without abnormal findings: Secondary | ICD-10-CM | POA: Diagnosis not present

## 2015-03-27 DIAGNOSIS — Z13820 Encounter for screening for osteoporosis: Secondary | ICD-10-CM | POA: Diagnosis not present

## 2015-03-27 NOTE — Progress Notes (Signed)
Pre visit review using our clinic review tool, if applicable. No additional management support is needed unless otherwise documented below in the visit note. 

## 2015-03-27 NOTE — Patient Instructions (Addendum)
Eat heart healthy diet (full of fruits, vegetables, whole grains, lean protein, water--limit salt, fat, and sugar intake) and increase physical activity as tolerated.  Continue doing brain stimulating activities (puzzles, reading, adult coloring books, staying active) to keep memory sharp.   Complete eye exam and have office fax [place] a copy of the results.  Complete mammogram.  Review information regarding Cologuard.    Keep appt with specialty providers as scheduled.  Follow up with Mackie Pai, PA-C as scheduled (04/10/15 at 9:30 am).      Diabetes and Foot Care Diabetes may cause you to have problems because of poor blood supply (circulation) to your feet and legs. This may cause the skin on your feet to become thinner, break easier, and heal more slowly. Your skin may become dry, and the skin may peel and crack. You may also have nerve damage in your legs and feet causing decreased feeling in them. You may not notice minor injuries to your feet that could lead to infections or more serious problems. Taking care of your feet is one of the most important things you can do for yourself.  HOME CARE INSTRUCTIONS  Wear shoes at all times, even in the house. Do not go barefoot. Bare feet are easily injured.  Check your feet daily for blisters, cuts, and redness. If you cannot see the bottom of your feet, use a mirror or ask someone for help.  Wash your feet with warm water (do not use hot water) and mild soap. Then pat your feet and the areas between your toes until they are completely dry. Do not soak your feet as this can dry your skin.  Apply a moisturizing lotion or petroleum jelly (that does not contain alcohol and is unscented) to the skin on your feet and to dry, brittle toenails. Do not apply lotion between your toes.  Trim your toenails straight across. Do not dig under them or around the cuticle. File the edges of your nails with an emery board or nail file.  Do not cut corns or  calluses or try to remove them with medicine.  Wear clean socks or stockings every day. Make sure they are not too tight. Do not wear knee-high stockings since they may decrease blood flow to your legs.  Wear shoes that fit properly and have enough cushioning. To break in new shoes, wear them for just a few hours a day. This prevents you from injuring your feet. Always look in your shoes before you put them on to be sure there are no objects inside.  Do not cross your legs. This may decrease the blood flow to your feet.  If you find a minor scrape, cut, or break in the skin on your feet, keep it and the skin around it clean and dry. These areas may be cleansed with mild soap and water. Do not cleanse the area with peroxide, alcohol, or iodine.  When you remove an adhesive bandage, be sure not to damage the skin around it.  If you have a wound, look at it several times a day to make sure it is healing.  Do not use heating pads or hot water bottles. They may burn your skin. If you have lost feeling in your feet or legs, you may not know it is happening until it is too late.  Make sure your health care provider performs a complete foot exam at least annually or more often if you have foot problems. Report any cuts, sores,  or bruises to your health care provider immediately. SEEK MEDICAL CARE IF:   You have an injury that is not healing.  You have cuts or breaks in the skin.  You have an ingrown nail.  You notice redness on your legs or feet.  You feel burning or tingling in your legs or feet.  You have pain or cramps in your legs and feet.  Your legs or feet are numb.  Your feet always feel cold. SEEK IMMEDIATE MEDICAL CARE IF:   There is increasing redness, swelling, or pain in or around a wound.  There is a red line that goes up your leg.  Pus is coming from a wound.  You develop a fever or as directed by your health care provider.  You notice a bad smell coming from an  ulcer or wound.   This information is not intended to replace advice given to you by your health care provider. Make sure you discuss any questions you have with your health care provider.   Document Released: 02/13/2000 Document Revised: 10/18/2012 Document Reviewed: 07/25/2012 Elsevier Interactive Patient Education 2016 [place] in the Home  Falls can cause injuries. They can happen to people of all ages. There are many things you can do to make your home safe and to help prevent falls.  WHAT CAN I DO ON THE OUTSIDE OF MY HOME?  Regularly fix the edges of walkways and driveways and fix any cracks.  Remove anything that might make you trip as you walk through a door, such as a raised step or threshold.  Trim any bushes or trees on the path to your home.  Use bright outdoor lighting.  Clear any walking paths of anything that might make someone trip, such as rocks or tools.  Regularly check to see if handrails are loose or broken. Make sure that both sides of any steps have handrails.  Any raised decks and porches should have guardrails on the edges.  Have any leaves, snow, or ice cleared regularly.  Use sand or salt on walking paths during winter.  Clean up any spills in your garage right away. This includes oil or grease spills. WHAT CAN I DO IN THE BATHROOM?   Use night lights.  Install grab bars by the toilet and in the tub and shower. Do not use towel bars as grab bars.  Use non-skid mats or decals in the tub or shower.  If you need to sit down in the shower, use a plastic, non-slip stool.  Keep the floor dry. Clean up any water that spills on the floor as soon as it happens.  Remove soap buildup in the tub or shower regularly.  Attach bath mats securely with double-sided non-slip rug tape.  Do not have throw rugs and other things on the floor that can make you trip. WHAT CAN I DO IN THE BEDROOM?  Use night lights.  Make sure that you have  a light by your bed that is easy to reach.  Do not use any sheets or blankets that are too big for your bed. They should not hang down onto the floor.  Have a firm chair that has side arms. You can use this for support while you get dressed.  Do not have throw rugs and other things on the floor that can make you trip. WHAT CAN I DO IN THE KITCHEN?  Clean up any spills right away.  Avoid walking on wet floors.  Keep  items that you use a lot in easy-to-reach places.  If you need to reach something above you, use a strong step stool that has a grab bar.  Keep electrical cords out of the way.  Do not use floor polish or wax that makes floors slippery. If you must use wax, use non-skid floor wax.  Do not have throw rugs and other things on the floor that can make you trip. WHAT CAN I DO WITH MY STAIRS?  Do not leave any items on the stairs.  Make sure that there are handrails on both sides of the stairs and use them. Fix handrails that are broken or loose. Make sure that handrails are as long as the stairways.  Check any carpeting to make sure that it is firmly attached to the stairs. Fix any carpet that is loose or worn.  Avoid having throw rugs at the top or bottom of the stairs. If you do have throw rugs, attach them to the floor with carpet tape.  Make sure that you have a light switch at the top of the stairs and the bottom of the stairs. If you do not have them, ask someone to add them for you. WHAT ELSE CAN I DO TO HELP PREVENT FALLS?  Wear shoes that:  Do not have high heels.  Have rubber bottoms.  Are comfortable and fit you well.  Are closed at the toe. Do not wear sandals.  If you use a stepladder:  Make sure that it is fully opened. Do not climb a closed stepladder.  Make sure that both sides of the stepladder are locked into place.  Ask someone to hold it for you, if possible.  Clearly mark and make sure that you can see:  Any grab bars or  handrails.  First and last steps.  Where the edge of each step is.  Use tools that help you move around (mobility aids) if they are needed. These include:  Canes.  Walkers.  Scooters.  Crutches.  Turn on the lights when you go into a dark area. Replace any light bulbs as soon as they burn out.  Set up your furniture so you have a clear path. Avoid moving your furniture around.  If any of your floors are uneven, fix them.  If there are any pets around you, be aware of where they are.  Review your medicines with your doctor. Some medicines can make you feel dizzy. This can increase your chance of falling. Ask your doctor what other things that you can do to help prevent falls.   This information is not intended to replace advice given to you by your health care provider. Make sure you discuss any questions you have with your health care provider.   Document Released: 12/12/2008 Document Revised: 07/02/2014 Document Reviewed: 03/22/2014 Elsevier Interactive Patient Education 2016 [place] A mammogram is an X-ray of the breasts that is done to check for changes that are not normal. This test can screen for and find any changes that may suggest breast cancer. This test can also help to find other changes and variations in the breast. BEFORE THE PROCEDURE  Have this test done about 1-2 weeks after your period. This is usually when your breasts are the least tender.  If you are visiting a new doctor or clinic, send any past mammogram images to your new doctor's office.  Wash your breasts and under your arms the day of the test.  Do not use  deodorants, perfumes, lotions, or powders on the day of the test.  Take off any jewelry from your neck.  Wear clothes that you can change into and out of easily. PROCEDURE  You will undress from the waist up. You will put on a gown.  You will stand in front of the X-ray machine.  Each breast will be placed between two  plastic or glass plates. The plates will press down on your breast for a few seconds. Try to stay as relaxed as possible. This does not cause any harm to your breasts. Any discomfort you feel will be very brief.  X-rays will be taken from different angles of each breast. The procedure may vary among doctors and hospitals. AFTER THE PROCEDURE  The mammogram will be looked at by a specialist (radiologist).  You may need to do certain parts of the test again. This depends on the quality of the images.  Ask when your test results will be ready. Make sure you get your test results.  You may go back to your normal activities.   This information is not intended to replace advice given to you by your health care provider. Make sure you discuss any questions you have with your health care provider.   Document Released: 05/10/2011 Document Revised: 11/06/2014 Document Reviewed: 04/26/2014 Elsevier Interactive Patient Education [employer].

## 2015-03-27 NOTE — Progress Notes (Addendum)
Subjective:   Alexis [name] is a 65 y.o. female who presents for an Initial Medicare Annual Wellness Visit.  Review of Systems: No ROS Cardiac Risk Factors include: advanced age (>61mn, >>29women);hypertension;obesity (BMI >30kg/m2);sedentary lifestyle (CHF, CAD, and AF) Sleep patterns:  Sleeps at least 7 hours of sleep at night; takes benadryl as a sleeping aid/wakes up once during the night to void.   Home Safety/Smoke Alarms: Feels safe at home.  Lives with husband and 2 dogs.  Smoke alarms present.   Firearm Safety: No firearms.   Seat Belt Safety/Bike Helmet:  Always wears seat belt.    Counseling:   Eye Exam- 1 year ago.  Has an appt scheduled.   Dental- Wears upper dentures.   Female:  Pap-Hysterectomy    Mammo-DUE, has been ordered.      Dexa scan-DUE, has been ordered      CCS-Declined; open to completing the Cologuard.       Objective:    Today's Vitals   03/27/15 1143  BP: 149/84  Pulse: 80  Height: 5' 2"  (1.575 m)  Weight: 213 lb 12.8 oz (96.979 kg)  SpO2: 100%  PainSc: 0-No pain    Current Medications (verified) Outpatient Encounter Prescriptions as of 03/27/2015  Medication Sig  . allopurinol (ZYLOPRIM) 100 MG tablet Take 1 tablet (100 mg total) by mouth daily.  .Marland KitchenApixaban (ELIQUIS PO) Take by mouth 2 (two) times daily.   .Marland Kitchenatorvastatin (LIPITOR) 10 MG tablet Take 1 tablet (10 mg total) by mouth daily.  . beclomethasone (QVAR) 40 MCG/ACT inhaler Inhale 2 puffs into the lungs 2 (two) times daily.  . cloNIDine (CATAPRES) 0.1 MG tablet Take 0.1 mg by mouth 2 (two) times daily.  .Marland Kitchenlevothyroxine (SYNTHROID, LEVOTHROID) 175 MCG tablet Take 175 mcg by mouth daily before breakfast.  . metoprolol succinate (TOPROL-XL) 25 MG 24 hr tablet Take 1 tablet (25 mg total) by mouth daily.  . Prenatal Vit-Fe Fumarate-FA (MULTIVITAMIN-PRENATAL) 27-0.8 MG TABS tablet Take 1 tablet by mouth daily at 12 noon. Takes for nail and hair growth  . ramipril (ALTACE) 2.5 MG capsule TAKE  1 CAPSULE BY MOUTH ONCE DAILY.  .Marland Kitchensertraline (ZOLOFT) 50 MG tablet Take 1 tablet (50 mg total) by mouth daily.  .Marland Kitchenspironolactone (ALDACTONE) 25 MG tablet Take 1 tablet (25 mg total) by mouth daily.  .Marland Kitchentorsemide (DEMADEX) 20 MG tablet Take 20 mg by mouth daily.   . busPIRone (BUSPAR) 7.5 MG tablet 1 tab po bid as needed anxiety (Patient not taking: Reported on 03/27/2015)  . loratadine (CLARITIN) 10 MG tablet Take 1 tablet (10 mg total) by mouth daily. (Patient not taking: Reported on 03/27/2015)  . [DISCONTINUED] predniSONE (DELTASONE) 10 MG tablet 4 tab po day 1, then 3 tab po day 2, then 2 day 3, then 1 tab po day 4. (Patient not taking: Reported on 03/27/2015)  . [DISCONTINUED] traMADol (ULTRAM) 50 MG tablet Take 1 tablet (50 mg total) by mouth every 6 (six) hours as needed. (Patient not taking: Reported on 03/27/2015)   No facility-administered encounter medications on file as of 03/27/2015.    Allergies (verified) Pregabalin and Sulfa antibiotics   History: Past Medical History  Diagnosis Date  . [place]ive heart [place]) ([place]   . Diabetes mellitus without complication ([place]   . Atrial fibrillation ([place]   . Thyroid disease   . Allergy   . Anxiety   . Arthritis   . Asthma   . COPD (chronic obstructive pulmonary  disease) ([place])   . Hyperlipidemia   . Hypertension   . Myocardial infarction [facility])    Past Surgical History  Procedure Laterality Date  . Total knee arthroplasty    . Coronary artery bypass graft     Family History  Problem Relation Age of Onset  . Diabetes Mother   . Hyperlipidemia Mother   . Diabetes Father   . Hyperlipidemia Father   . Heart disease Father     before age 45  . Heart attack Father   . Diabetes Brother   . Hyperlipidemia Brother   . Heart attack Brother    Social History   Occupational History  . Not on file.   Social History Main Topics  . Smoking status: Former Smoker    Quit date: 03/01/1998  . Smokeless tobacco: Never Used    . 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  . Drug Use: No  . Sexual Activity: No    Tobacco Counseling Counseling given: No   Activities of Daily Living In your present state of health, do you have any difficulty performing the following activities: 03/27/2015 01/09/2015  Hearing? Y N  Vision? N N  Difficulty concentrating or making decisions? Y N  Walking or climbing stairs? Y Y  Dressing or bathing? N N  Doing errands, shopping? N N  Preparing Food and eating ? N -  Using the Toilet? N -  In the past six months, have you accidently leaked urine? Y -  Do you have problems with loss of bowel control? N -  Managing your Medications? N -  Managing your Finances? N -  Housekeeping or managing your Housekeeping? N -    Immunizations and Health Maintenance Immunization History  Administered Date(s) Administered  . Influenza,inj,Quad PF,36+ Mos 01/09/2015  . Influenza-Unspecified 01/05/2007, 12/23/2010, 01/03/2012  . Pneumococcal Conjugate-13 01/09/2015  . Td 12/23/2010   Health Maintenance Due  Topic Date Due  . HEMOGLOBIN A1C  [birth year]  . OPHTHALMOLOGY EXAM  02/11/1959  . MAMMOGRAM  02/11/1999  . COLONOSCOPY  02/11/1999  . DEXA SCAN  02/10/2014  . FOOT EXAM  03/01/2014    Patient Care Team: Mackie Pai, PA-C as PCP - General (Physician Assistant) Karl Luke, MD as Referring Physician (Neurology) [name] Dupes, FNP as Referring Physician (Endocrinology) Mahala Menghini, MD as Referring Physician (Cardiology) Angelica Ran, [place] as Referring Physician (Optometry)  Indicate any recent Medical Services you may have received from other than [facility] providers in the past year (date may be approximate).     Assessment:   This is a routine wellness examination for [name].  Assessment deferred to PCP.    Hearing/Vision screen Hearing Screening Comments: Able to hear conversational tones. Unable to hear whispered tones from  across the room.   Reports having issues with her hearing in right ear.    Vision Screening Comments: Last eye exam:  1 year ago Has an appt scheduled for next Friday.   Wears corrective glasses  Dietary issues and exercise activities discussed: Current Exercise Habits:: The patient does not participate in regular exercise at present (Plans to enroll in [employer]   Diet: Regular diet.  Eats 2 meals per day.  Watch carb and sodium intake.    Goals    . Weight < 200 lb (90.719 kg)      Depression Screen PHQ 2/9 Scores 03/27/2015 12/02/2014  PHQ - 2 Score 1  0    Fall Risk Fall Risk  03/27/2015 12/02/2014  Falls in the past year? Yes No  Number falls in past yr: 2 or more -  Injury with Fall? Yes -  Risk Factor Category  High Fall Risk -  Risk for fall due to : Impaired mobility -  Risk for fall due to (comments): Left knee goes out.   -  Follow up Education provided;Falls prevention discussed -    Cognitive Function: MMSE - Mini Mental State Exam 03/27/2015  Orientation to time 5  Orientation to Place 5  Registration 3  Attention/ Calculation 5  Recall 1  Language- name 2 objects 2  Language- repeat 1  Language- follow 3 step command 3  Language- read & follow direction 1  Write a sentence 1  Copy design 1  Total score 28    Screening Tests Health Maintenance  Topic Date Due  . HEMOGLOBIN A1C  Apr 28, 1950  . OPHTHALMOLOGY EXAM  02/11/1959  . MAMMOGRAM  02/11/1999  . COLONOSCOPY  02/11/1999  . DEXA SCAN  02/10/2014  . FOOT EXAM  03/01/2014  . ZOSTAVAX  07/18/2015 (Originally 02/10/2009)  . Hepatitis C Screening  01/09/2016 (Originally 1950-12-25)  . INFLUENZA VACCINE  09/30/2015  . PNA vac Low Risk Adult (2 of 2 - PPSV23) 01/09/2016  . TETANUS/TDAP  12/22/2020      Plan:  Eat heart healthy diet (full of fruits, vegetables, whole grains, lean protein, water--limit salt, fat, and sugar intake) and increase physical activity as  tolerated.  Continue doing brain stimulating activities (puzzles, reading, adult coloring books, staying active) to keep memory sharp.   Complete eye exam and have office fax [place] a copy of the results.  Complete mammogram.  Review information regarding Cologuard.    Keep appt with specialty providers as scheduled.  Follow up with Mackie Pai, PA-C as scheduled (04/10/15 at 9:30 am).     During the course of the visit, Lyndie was educated and counseled about the following appropriate screening and preventive services:   Vaccines to include Pneumoccal, Influenza, Hepatitis B, Td, Zostavax, HCV  Electrocardiogram  Cardiovascular disease screening  Colorectal cancer screening  Bone density screening  Diabetes screening  Glaucoma screening  Mammography/PAP  Nutrition counseling  Smoking cessation counseling  Patient Instructions (the written plan) were given to the patient.    Rudene Anda, RN   03/27/2015    I have read and agreed with management and assessment by RN. Pt will  follow up with me in early February. Will complete physical and review closing of health maintenance gaps with pt.  Mackie Pai PA-C

## 2015-04-03 ENCOUNTER — Encounter: Payer: Commercial Managed Care - HMO | Admitting: Medical

## 2015-04-03 DIAGNOSIS — E890 Postprocedural hypothyroidism: Secondary | ICD-10-CM | POA: Diagnosis not present

## 2015-04-03 DIAGNOSIS — R202 Paresthesia of skin: Secondary | ICD-10-CM | POA: Diagnosis not present

## 2015-04-03 DIAGNOSIS — E669 Obesity, unspecified: Secondary | ICD-10-CM | POA: Diagnosis not present

## 2015-04-03 DIAGNOSIS — E7800 Pure hypercholesterolemia, unspecified: Secondary | ICD-10-CM | POA: Diagnosis not present

## 2015-04-03 DIAGNOSIS — Z6838 Body mass index (BMI) 38.0-38.9, adult: Secondary | ICD-10-CM | POA: Diagnosis not present

## 2015-04-03 DIAGNOSIS — E1142 Type 2 diabetes mellitus with diabetic polyneuropathy: Secondary | ICD-10-CM | POA: Diagnosis not present

## 2015-04-03 DIAGNOSIS — I10 Essential (primary) hypertension: Secondary | ICD-10-CM | POA: Diagnosis not present

## 2015-04-03 LAB — HEMOGLOBIN A1C: Hemoglobin A1C: 9

## 2015-04-10 ENCOUNTER — Encounter: Payer: Self-pay | Admitting: Medical

## 2015-04-10 ENCOUNTER — Ambulatory Visit (INDEPENDENT_AMBULATORY_CARE_PROVIDER_SITE_OTHER): Payer: Commercial Managed Care - HMO | Admitting: Medical

## 2015-04-10 VITALS — BP 120/80 | HR 77 | Temp 97.6°F | Ht 62.0 in | Wt 212.6 lb

## 2015-04-10 DIAGNOSIS — I10 Essential (primary) hypertension: Secondary | ICD-10-CM | POA: Diagnosis not present

## 2015-04-10 DIAGNOSIS — E785 Hyperlipidemia, unspecified: Secondary | ICD-10-CM | POA: Diagnosis not present

## 2015-04-10 DIAGNOSIS — I509 Heart failure, unspecified: Secondary | ICD-10-CM

## 2015-04-10 DIAGNOSIS — E119 Type 2 diabetes mellitus without complications: Secondary | ICD-10-CM

## 2015-04-10 DIAGNOSIS — I4891 Unspecified atrial fibrillation: Secondary | ICD-10-CM | POA: Diagnosis not present

## 2015-04-10 DIAGNOSIS — E038 Other specified hypothyroidism: Secondary | ICD-10-CM | POA: Diagnosis not present

## 2015-04-10 LAB — COMPREHENSIVE METABOLIC PANEL
ALT: 44 U/L — AB (ref 0–35)
AST: 53 U/L — AB (ref 0–37)
Albumin: 4.6 g/dL (ref 3.5–5.2)
Alkaline Phosphatase: 120 U/L — ABNORMAL HIGH (ref 39–117)
BILIRUBIN TOTAL: 0.9 mg/dL (ref 0.2–1.2)
BUN: 32 mg/dL — ABNORMAL HIGH (ref 6–23)
CHLORIDE: 99 meq/L (ref 96–112)
CO2: 29 meq/L (ref 19–32)
CREATININE: 0.83 mg/dL (ref 0.40–1.20)
Calcium: 10.4 mg/dL (ref 8.4–10.5)
GFR: 73.07 mL/min (ref >60.00)
GLUCOSE: 261 mg/dL — AB (ref 70–99)
Potassium: 4.3 mEq/L (ref 3.5–5.1)
SODIUM: 137 meq/L (ref 135–145)
Total Protein: 8.5 g/dL — ABNORMAL HIGH (ref 6.0–8.3)

## 2015-04-10 LAB — LIPID PANEL
CHOL/HDL RATIO: 6
Cholesterol: 184 mg/dL (ref 0–200)
HDL: 33.1 mg/dL — ABNORMAL LOW (ref >39.00)
NONHDL: 150.53
Triglycerides: 254 mg/dL — ABNORMAL HIGH (ref 0.0–149.0)
VLDL: 50.8 mg/dL — AB (ref 0.0–40.0)

## 2015-04-10 LAB — LDL CHOLESTEROL, DIRECT: LDL DIRECT: 111 mg/dL

## 2015-04-10 LAB — TSH: TSH: 5.63 u[IU]/mL — ABNORMAL HIGH (ref 0.35–4.50)

## 2015-04-10 NOTE — Progress Notes (Signed)
Pre visit review using our clinic review tool, if applicable. No additional management support is needed unless otherwise documented below in the visit note. 

## 2015-04-10 NOTE — Patient Instructions (Addendum)
Your bp is well controlled continue current management.  History of chf and atrial fibrillation. Will refer you back to Dr. Phillis Haggis.  Will check lipid today and make adjustment if necessary.  Will get tsh results from your endocrinologist  For your diabetes will get urine microalbumin.  zosta vaccine Offered. Pt advised to check insurance first.   Please go down and schedule mammogram and bone density.  Discussed cologuard as option since declines coloscopy.  Follow up date to be determined after lab review.

## 2015-04-10 NOTE — Progress Notes (Signed)
Subjective:    Patient ID: Alexis [name], female    DOB: 1950-09-19, 65 y.o.   MRN: [id number]  HPI  Pt in post wellness exam done by RN. I did review.  Pt states she did endocrinologist for her diabetes past Thursday. Pt a1-c was 9.0. Before was 8.0. Pt is going to see nutrtionist at 10 :30 am tomorrow.  Pt was scheduled to have diabetic eye check last Friday. But she can't get that done until her insurance card comes in the mail.  Pt has history of atrial fibrillation and has pacemaker. Pt has history of CHF as well.  Pt sees Dr. Phillis Haggis. Pt last saw him last year.   Pt had hysterectomy in 1978. Ovaries removed as well.  Pt is fasting today.  Pt will go down stairs to schedule mammogram.  Pt has bone density ordered but looks like not scheduled.  Pt reports occasional fatigue.  Htn. bp well controlled today.      Review of Systems  Constitutional: Negative for fever, chills, diaphoresis, activity change and fatigue.  HENT: Negative for congestion and ear pain.   Respiratory: Negative for cough, chest tightness and shortness of breath.   Cardiovascular: Negative for chest pain, palpitations and leg swelling.  Gastrointestinal: Negative for nausea, vomiting and abdominal pain.  Musculoskeletal: Negative for back pain, neck pain and neck stiffness.  Neurological: Negative for dizziness, facial asymmetry, speech difficulty and weakness.  Psychiatric/Behavioral: Negative for behavioral problems, confusion and agitation. The patient is not nervous/anxious.    Past Medical History  Diagnosis Date  . CHF (congestive heart failure) ([place])   . Diabetes mellitus without complication ([place])   . Atrial fibrillation ([place])   . Thyroid disease   . Allergy   . Anxiety   . Arthritis   . Asthma   . COPD (chronic obstructive pulmonary disease) ([place])   . Hyperlipidemia   . Hypertension   . Myocardial infarction [facility])     Social History   Social History  . Marital Status:  Married    Spouse Name: N/A  . Number of Children: N/A  . Years of Education: N/A   Occupational History  . Not on file.   Social History Main Topics  . Smoking status: Former Smoker    Quit date: 03/01/1998  . Smokeless tobacco: Never Used  . 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  . Drug Use: No  . Sexual Activity: No   Other Topics Concern  . Not on file   Social History Narrative    Past Surgical History  Procedure Laterality Date  . Total knee arthroplasty    . Coronary artery bypass graft      Family History  Problem Relation Age of Onset  . Diabetes Mother   . Hyperlipidemia Mother   . Diabetes Father   . Hyperlipidemia Father   . Heart disease Father     before age 79  . Heart attack Father   . Diabetes Brother   . Hyperlipidemia Brother   . Heart attack Brother     Allergies  Allergen Reactions  . Pregabalin     Other reaction(s): DIZZINESS  . Sulfa Antibiotics Hives    Current Outpatient Prescriptions on File Prior to Visit  Medication Sig Dispense Refill  . allopurinol (ZYLOPRIM) 100 MG tablet Take 1 tablet (100 mg total) by mouth daily. 30 tablet 5  . Apixaban (ELIQUIS PO) Take by mouth  2 (two) times daily.     Marland Kitchen atorvastatin (LIPITOR) 10 MG tablet Take 1 tablet (10 mg total) by mouth daily. 30 tablet 0  . beclomethasone (QVAR) 40 MCG/ACT inhaler Inhale 2 puffs into the lungs 2 (two) times daily. 1 Inhaler 3  . busPIRone (BUSPAR) 7.5 MG tablet 1 tab po bid as needed anxiety 30 tablet 0  . cloNIDine (CATAPRES) 0.1 MG tablet Take 0.1 mg by mouth 2 (two) times daily.    Marland Kitchen levothyroxine (SYNTHROID, LEVOTHROID) 175 MCG tablet Take 175 mcg by mouth daily before breakfast.    . loratadine (CLARITIN) 10 MG tablet Take 1 tablet (10 mg total) by mouth daily. 30 tablet 0  . metoprolol succinate (TOPROL-XL) 25 MG 24 hr tablet Take 1 tablet (25 mg total) by mouth daily. 30 tablet 5  . Prenatal Vit-Fe Fumarate-FA  (MULTIVITAMIN-PRENATAL) 27-0.8 MG TABS tablet Take 1 tablet by mouth daily at 12 noon. Takes for nail and hair growth    . ramipril (ALTACE) 2.5 MG capsule TAKE 1 CAPSULE BY MOUTH ONCE DAILY. 30 capsule 5  . sertraline (ZOLOFT) 50 MG tablet Take 1 tablet (50 mg total) by mouth daily. 30 tablet 0  . spironolactone (ALDACTONE) 25 MG tablet Take 1 tablet (25 mg total) by mouth daily. 30 tablet 5  . torsemide (DEMADEX) 20 MG tablet Take 20 mg by mouth daily.      No current facility-administered medications on file prior to visit.    BP 120/80 mmHg  Pulse 77  Temp(Src) 97.6 F (36.4 C) (Oral)  Ht 5' 2"  (1.575 m)  Wt 212 lb 9.6 oz (96.435 kg)  BMI 38.88 kg/m2  SpO2 95%       Objective:   Physical Exam  General Mental Status- Alert. General Appearance- Not in acute distress.   Skin General: Color- Normal Color. Moisture- Normal Moisture.  Neck Carotid Arteries- Normal color. Moisture- Normal Moisture. No carotid bruits. No JVD.  Chest and Lung Exam Auscultation: Breath Sounds:-Normal.  Cardiovascular Auscultation:Rythm- Regular. Murmurs & Other Heart Sounds:Auscultation of the heart reveals- No Murmurs.  Abdomen Inspection:-Inspeection Normal. Palpation/Percussion:Note:No mass. Palpation and Percussion of the abdomen reveal- Non Tender, Non Distended + BS, no rebound or guarding.    Neurologic Cranial Nerve exam:- CN III-XII intact(No nystagmus), symmetric smile. Strength:- 5/5 equal and symmetric strength both upper and lower extremities.  Back- no cva tenderness.  Skin- no lesions that are worrisome.      Assessment & Plan:  Your bp is well controlled continue current management.  History of chf and atrial fibrillation. Will refer you back to Dr. Phillis Haggis.  Will check lipid today and make adjustment if necessary.  Will get tsh results from your endocrinologist  For your diabetes will get urine microalbumin.  zostavaccine  Offered. She will check with  insurance. Same advised for hepatitis screening testing.  Please go down and schedule mammogram and bone density.  Discussed cologuard as option since declines coloscopy.

## 2015-04-11 LAB — MICROALBUMIN, URINE: MICROALB UR: 0.3 mg/dL

## 2015-04-15 ENCOUNTER — Inpatient Hospital Stay (HOSPITAL_BASED_OUTPATIENT_CLINIC_OR_DEPARTMENT_OTHER): Admission: RE | Admit: 2015-04-15 | Payer: Commercial Managed Care - HMO | Source: Ambulatory Visit

## 2015-04-15 ENCOUNTER — Other Ambulatory Visit (HOSPITAL_BASED_OUTPATIENT_CLINIC_OR_DEPARTMENT_OTHER): Payer: Commercial Managed Care - HMO

## 2015-04-16 ENCOUNTER — Other Ambulatory Visit: Payer: Self-pay

## 2015-04-16 MED ORDER — SERTRALINE HCL 50 MG PO TABS: 50.0000 mg | ORAL_TABLET | Freq: Every day | ORAL | Status: DC

## 2015-04-16 NOTE — Telephone Encounter (Signed)
Pt was seen on 04/10/2015, Pt requesting a refill of Zoloft, with #30 and 0 refills.  Please  Advise.

## 2015-04-16 NOTE — Telephone Encounter (Signed)
Refilled pt sertraline

## 2015-04-25 ENCOUNTER — Other Ambulatory Visit: Payer: Self-pay

## 2015-04-25 MED ORDER — METOPROLOL SUCCINATE ER 25 MG PO TB24: 25.0000 mg | ORAL_TABLET | Freq: Every day | ORAL | Status: DC

## 2015-05-01 DIAGNOSIS — E1165 Type 2 diabetes mellitus with hyperglycemia: Secondary | ICD-10-CM | POA: Diagnosis not present

## 2015-05-01 DIAGNOSIS — E039 Hypothyroidism, unspecified: Secondary | ICD-10-CM | POA: Diagnosis not present

## 2015-05-01 DIAGNOSIS — Z794 Long term (current) use of insulin: Secondary | ICD-10-CM | POA: Diagnosis not present

## 2015-05-01 DIAGNOSIS — I10 Essential (primary) hypertension: Secondary | ICD-10-CM | POA: Diagnosis not present

## 2015-05-01 DIAGNOSIS — E782 Mixed hyperlipidemia: Secondary | ICD-10-CM | POA: Diagnosis not present

## 2015-05-01 DIAGNOSIS — E669 Obesity, unspecified: Secondary | ICD-10-CM | POA: Diagnosis not present

## 2015-05-01 DIAGNOSIS — E1142 Type 2 diabetes mellitus with diabetic polyneuropathy: Secondary | ICD-10-CM | POA: Diagnosis not present

## 2015-05-08 DIAGNOSIS — I739 Peripheral vascular disease, unspecified: Secondary | ICD-10-CM | POA: Diagnosis not present

## 2015-05-08 DIAGNOSIS — I509 Heart failure, unspecified: Secondary | ICD-10-CM | POA: Diagnosis not present

## 2015-05-08 DIAGNOSIS — I428 Other cardiomyopathies: Secondary | ICD-10-CM | POA: Diagnosis not present

## 2015-05-08 DIAGNOSIS — I4891 Unspecified atrial fibrillation: Secondary | ICD-10-CM | POA: Diagnosis not present

## 2015-05-08 DIAGNOSIS — Z45018 Encounter for adjustment and management of other part of cardiac pacemaker: Secondary | ICD-10-CM | POA: Diagnosis not present

## 2015-05-08 DIAGNOSIS — Z950 Presence of cardiac pacemaker: Secondary | ICD-10-CM | POA: Diagnosis not present

## 2015-05-08 DIAGNOSIS — I10 Essential (primary) hypertension: Secondary | ICD-10-CM | POA: Diagnosis not present

## 2015-05-15 ENCOUNTER — Telehealth: Payer: Self-pay | Admitting: Medical

## 2015-05-15 NOTE — Telephone Encounter (Signed)
Pts insurance company called and had me on the phone for 12 min 44 sec repeatedly stating that we are not in network with the pts insurance. She stating that we have misinformed the pt and her bills will not be paid by insurance and that is not fair of [place]. She was able to pull info on all other [employer] account executive offices except [employer]. Please review.

## 2015-06-12 DIAGNOSIS — H5213 Myopia, bilateral: Secondary | ICD-10-CM | POA: Diagnosis not present

## 2015-06-12 DIAGNOSIS — H5210 Myopia, unspecified eye: Secondary | ICD-10-CM | POA: Diagnosis not present

## 2015-06-20 ENCOUNTER — Emergency Department (HOSPITAL_BASED_OUTPATIENT_CLINIC_OR_DEPARTMENT_OTHER)
Admission: EM | Admit: 2015-06-20 | Discharge: 2015-06-20 | Disposition: A | Discharge status: Home / Self Care | Payer: Commercial Managed Care - HMO | Attending: Emergency Medicine | Admitting: Emergency Medicine

## 2015-06-20 ENCOUNTER — Emergency Department (HOSPITAL_BASED_OUTPATIENT_CLINIC_OR_DEPARTMENT_OTHER): Payer: Commercial Managed Care - HMO

## 2015-06-20 ENCOUNTER — Encounter (HOSPITAL_BASED_OUTPATIENT_CLINIC_OR_DEPARTMENT_OTHER): Payer: Self-pay | Admitting: Emergency Medicine

## 2015-06-20 ENCOUNTER — Telehealth: Payer: Self-pay | Admitting: Medical

## 2015-06-20 DIAGNOSIS — I4891 Unspecified atrial fibrillation: Secondary | ICD-10-CM | POA: Diagnosis not present

## 2015-06-20 DIAGNOSIS — E119 Type 2 diabetes mellitus without complications: Secondary | ICD-10-CM | POA: Diagnosis not present

## 2015-06-20 DIAGNOSIS — Z87891 Personal history of nicotine dependence: Secondary | ICD-10-CM | POA: Insufficient documentation

## 2015-06-20 DIAGNOSIS — I482 Chronic atrial fibrillation, unspecified: Secondary | ICD-10-CM

## 2015-06-20 DIAGNOSIS — J449 Chronic obstructive pulmonary disease, unspecified: Secondary | ICD-10-CM | POA: Insufficient documentation

## 2015-06-20 DIAGNOSIS — I252 Old myocardial infarction: Secondary | ICD-10-CM | POA: Diagnosis not present

## 2015-06-20 DIAGNOSIS — H938X1 Other specified disorders of right ear: Secondary | ICD-10-CM | POA: Insufficient documentation

## 2015-06-20 DIAGNOSIS — Z8739 Personal history of other diseases of the musculoskeletal system and connective tissue: Secondary | ICD-10-CM | POA: Insufficient documentation

## 2015-06-20 DIAGNOSIS — R0981 Nasal congestion: Secondary | ICD-10-CM | POA: Insufficient documentation

## 2015-06-20 DIAGNOSIS — R42 Dizziness and giddiness: Secondary | ICD-10-CM | POA: Diagnosis not present

## 2015-06-20 DIAGNOSIS — Z7951 Long term (current) use of inhaled steroids: Secondary | ICD-10-CM | POA: Diagnosis not present

## 2015-06-20 DIAGNOSIS — I509 Heart failure, unspecified: Secondary | ICD-10-CM | POA: Diagnosis not present

## 2015-06-20 DIAGNOSIS — E079 Disorder of thyroid, unspecified: Secondary | ICD-10-CM | POA: Insufficient documentation

## 2015-06-20 DIAGNOSIS — Z955 Presence of coronary angioplasty implant and graft: Secondary | ICD-10-CM | POA: Insufficient documentation

## 2015-06-20 DIAGNOSIS — F419 Anxiety disorder, unspecified: Secondary | ICD-10-CM | POA: Diagnosis not present

## 2015-06-20 DIAGNOSIS — Z79899 Other long term (current) drug therapy: Secondary | ICD-10-CM | POA: Insufficient documentation

## 2015-06-20 DIAGNOSIS — I10 Essential (primary) hypertension: Secondary | ICD-10-CM | POA: Diagnosis not present

## 2015-06-20 DIAGNOSIS — Z7901 Long term (current) use of anticoagulants: Secondary | ICD-10-CM | POA: Diagnosis not present

## 2015-06-20 DIAGNOSIS — E785 Hyperlipidemia, unspecified: Secondary | ICD-10-CM | POA: Diagnosis not present

## 2015-06-20 LAB — BASIC METABOLIC PANEL
ANION GAP: 8 (ref 5–15)
BUN: 24 mg/dL — AB (ref 6–20)
CALCIUM: 9.1 mg/dL (ref 8.9–10.3)
CO2: 23 mmol/L (ref 22–32)
CREATININE: 0.77 mg/dL (ref 0.44–1.00)
Chloride: 104 mmol/L (ref 101–111)
GFR calc Af Amer: >60 mL/min (ref >60)
GLUCOSE: 289 mg/dL — AB (ref 65–99)
Potassium: 3.6 mmol/L (ref 3.5–5.1)
Sodium: 135 mmol/L (ref 135–145)

## 2015-06-20 LAB — CBC WITH DIFFERENTIAL/PLATELET
BASOS ABS: 0 10*3/uL (ref 0.0–0.1)
Basophils Relative: 0 %
EOS PCT: 1 %
Eosinophils Absolute: 0.1 10*3/uL (ref 0.0–0.7)
HCT: 37.7 % (ref 36.0–46.0)
Hemoglobin: 13.2 g/dL (ref 12.0–15.0)
LYMPHS PCT: 26 %
Lymphs Abs: 1.1 10*3/uL (ref 0.7–4.0)
MCH: 33.2 pg (ref 26.0–34.0)
MCHC: 35 g/dL (ref 30.0–36.0)
MCV: 95 fL (ref 78.0–100.0)
MONO ABS: 0.3 10*3/uL (ref 0.1–1.0)
Monocytes Relative: 6 %
Neutro Abs: 2.7 10*3/uL (ref 1.7–7.7)
Neutrophils Relative %: 66 %
PLATELETS: 66 10*3/uL — AB (ref 150–400)
RBC: 3.97 MIL/uL (ref 3.87–5.11)
RDW: 13.5 % (ref 11.5–15.5)
WBC: 4.1 10*3/uL (ref 4.0–10.5)

## 2015-06-20 MED ORDER — MECLIZINE HCL 25 MG PO TABS: 25.0000 mg | ORAL_TABLET | Freq: Three times a day (TID) | ORAL | Status: DC | PRN

## 2015-06-20 MED ORDER — MECLIZINE HCL 25 MG PO TABS
25.0000 mg | ORAL_TABLET | Freq: Once | ORAL | Status: AC
  Administered 2015-06-20: 25 mg via ORAL
  Filled 2015-06-20: qty 1

## 2015-06-20 NOTE — Telephone Encounter (Signed)
Per chart, pt went to the ER as advised.

## 2015-06-20 NOTE — ED Notes (Signed)
Patient states that she has been in and out of "A.fib" all day. Reports that she had had dizziness for about 2 weeks, but today it has been worse. The patient also is nauseated

## 2015-06-20 NOTE — Telephone Encounter (Signed)
Patient Name: Alexis [name]  DOB: June 11, 1950    Initial Comment Caller states that she is dizzy with nausea. She thinks that she has vertigo. NO LOC   Nurse Assessment  Nurse: Raphael Gibney, RN, Vanita Ingles Date/Time (Eastern Time): 06/20/2015 1:15:46 PM  Confirm and document reason for call. If symptomatic, describe symptoms. You must click the next button to save text entered. ---Caller states she is dizzy. thinks she has vertigo. Dizziness is causing nausea. Room is spinning. has had vertigo for a couple of weeks but today it is worse.  Has the patient traveled out of the country within the last 30 days? ---Not Applicable  Does the patient have any new or worsening symptoms? ---Yes  Will a triage be completed? ---Yes  Related visit to physician within the last 2 weeks? ---No  Does the PT have any chronic conditions? (i.e. diabetes, asthma, etc.) ---Yes  List chronic conditions. ---diabetes; heart problems, HTN  Is this a behavioral health or substance abuse call? ---No     Guidelines    Guideline Title Affirmed Question Affirmed Notes  Dizziness - Vertigo [1] Dizziness (vertigo) present now AND [2] age > 75 (Exception: prior physician evaluation for this AND no different/worse than usual)    Final Disposition User   Go to ED Now (or PCP triage) Raphael Gibney, RN, [facility] - ED   Disagree/Comply: Comply

## 2015-06-20 NOTE — ED Provider Notes (Signed)
CSN: [id number]     Arrival date & time 06/20/15  1543 History   First MD Initiated Contact with Patient 06/20/15 1553     Chief Complaint  Patient presents with  . Dizziness      HPI  She presents for evaluation of dizziness. She describes it as "off balance, or like I'm drunk". Denies orthostasis or lightheadedness. States she's had a history visual fibrillation in the past. Is on quite relation with Eliquis. Is on metoprolol for rate control. States her last physician's evaluation her pacemaker was interrogated and she is "in nature fibrillation a lot". She's not feeling taken palpitations. No changes in her medications.  She said some pressure in her right ear and some nasal congestion for 4-5 days in the vertigo started yesterday. Minimal. Associated with change in positions.  Past Medical History  Diagnosis Date  . CHF (congestive heart failure) ([place])   . Diabetes mellitus without complication ([place])   . Atrial fibrillation ([place])   . Thyroid disease   . Allergy   . Anxiety   . Arthritis   . Asthma   . COPD (chronic obstructive pulmonary disease) ([place])   . Hyperlipidemia   . Hypertension   . Myocardial infarction [facility])    Past Surgical History  Procedure Laterality Date  . Total knee arthroplasty    . Coronary artery bypass graft     Family History  Problem Relation Age of Onset  . Diabetes Mother   . Hyperlipidemia Mother   . Diabetes Father   . Hyperlipidemia Father   . Heart disease Father     before age 94  . Heart attack Father   . Diabetes Brother   . Hyperlipidemia Brother   . Heart attack Brother    Social History  Substance Use Topics  . Smoking status: Former Smoker    Quit date: 03/01/1998  . Smokeless tobacco: Never Used  . 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   OB History    No data available     Review of Systems  Constitutional: Negative for fever, chills, diaphoresis, appetite change and  fatigue.  HENT: Negative for mouth sores, sore throat and trouble swallowing.        Right ear pressure  Eyes: Negative for visual disturbance.  Respiratory: Negative for cough, chest tightness, shortness of breath and wheezing.   Cardiovascular: Negative for chest pain.  Gastrointestinal: Negative for nausea, vomiting, abdominal pain, diarrhea and abdominal distention.  Endocrine: Negative for polydipsia, polyphagia and polyuria.  Genitourinary: Negative for dysuria, frequency and hematuria.  Musculoskeletal: Negative for gait problem.  Skin: Negative for color change, pallor and rash.  Neurological: Positive for dizziness. Negative for syncope, light-headedness and headaches.  Hematological: Does not bruise/bleed easily.  Psychiatric/Behavioral: Negative for behavioral problems and confusion.      Allergies  Pregabalin and Sulfa antibiotics  Home Medications   Prior to Admission medications   Medication Sig Start Date End Date Taking? Authorizing Provider  allopurinol (ZYLOPRIM) 100 MG tablet Take 1 tablet (100 mg total) by mouth daily. 01/09/15   Percell Miller Saguier, PA-C  Apixaban (ELIQUIS PO) Take by mouth 2 (two) times daily.     Historical Provider, MD  atorvastatin (LIPITOR) 10 MG tablet Take 1 tablet (10 mg total) by mouth daily. 08/28/14   Percell Miller Saguier, PA-C  beclomethasone (QVAR) 40 MCG/ACT inhaler Inhale 2 puffs into the lungs 2 (two) times daily. 01/27/15   Gwyndolyn Saxon  Daun Peacock, PA-C  busPIRone (BUSPAR) 7.5 MG tablet 1 tab po bid as needed anxiety 03/20/15   Mackie Pai, PA-C  cloNIDine (CATAPRES) 0.1 MG tablet Take 0.1 mg by mouth 2 (two) times daily.    Historical Provider, MD  levothyroxine (SYNTHROID, LEVOTHROID) 175 MCG tablet Take 175 mcg by mouth daily before breakfast.    Historical Provider, MD  loratadine (CLARITIN) 10 MG tablet Take 1 tablet (10 mg total) by mouth daily. 08/05/14   Percell Miller Saguier, PA-C  meclizine (ANTIVERT) 25 MG tablet Take 1 tablet (25 mg total) by  mouth 3 (three) times daily as needed. 06/20/15   Tanna Furry, MD  metoprolol succinate (TOPROL-XL) 25 MG 24 hr tablet Take 1 tablet (25 mg total) by mouth daily. 04/25/15   Mackie Pai, PA-C  Prenatal Vit-Fe Fumarate-FA (MULTIVITAMIN-PRENATAL) 27-0.8 MG TABS tablet Take 1 tablet by mouth daily at 12 noon. Takes for nail and hair growth    Historical Provider, MD  ramipril (ALTACE) 2.5 MG capsule TAKE 1 CAPSULE BY MOUTH ONCE DAILY. 01/09/15   Percell Miller Saguier, PA-C  sertraline (ZOLOFT) 50 MG tablet Take 1 tablet (50 mg total) by mouth daily. 04/16/15   Percell Miller Saguier, PA-C  spironolactone (ALDACTONE) 25 MG tablet Take 1 tablet (25 mg total) by mouth daily. 01/09/15   Percell Miller Saguier, PA-C  torsemide (DEMADEX) 20 MG tablet Take 20 mg by mouth daily.     Historical Provider, MD   BP 144/49 mmHg  Pulse 84  Temp(Src) 98.6 F (37 C) (Oral)  Resp 18  Ht 5' 2"  (1.575 m)  Wt 212 lb (96.163 kg)  BMI 38.77 kg/m2  SpO2 98% Physical Exam  Constitutional: She is oriented to person, place, and time. She appears well-developed and well-nourished. No distress.  HENT:  Head: Normocephalic.  Right ear effusion. Not erythematous or injected. Does not appear infected.  Eyes: Conjunctivae are normal. Pupils are equal, round, and reactive to light. No scleral icterus.  No inducible nystagmus with position change right movements.  Neck: Normal range of motion. Neck supple. No thyromegaly present.  Cardiovascular: Normal rate and regular rhythm.  Exam reveals no gallop and no friction rub.   No murmur heard. Rate controlled atrial fibrillation rate between 71-95.  Pulmonary/Chest: Effort normal and breath sounds normal. No respiratory distress. She has no wheezes. She has no rales.  Abdominal: Soft. Bowel sounds are normal. She exhibits no distension. There is no tenderness. There is no rebound.  Musculoskeletal: Normal range of motion.  Neurological: She is alert and oriented to person, place, and time.  A  symptomatically vertigo in the room. Can position change and movement head without symptoms. No other neurological findings. Normal cranial nerves. Normal gait. Normal strength and motor exam. Normal sensation. Normal cerebellar.  Skin: Skin is warm and dry. No rash noted.  Psychiatric: She has a normal mood and affect. Her behavior is normal.    ED Course  Procedures (including critical care time) Labs Review Labs Reviewed  CBC WITH DIFFERENTIAL/PLATELET - Abnormal; Notable for the following:    Platelets 66 (*)    All other components within normal limits  BASIC METABOLIC PANEL - Abnormal; Notable for the following:    Glucose, Bld 289 (*)    BUN 24 (*)    All other components within normal limits    Imaging Review Dg Chest 2 View  06/20/2015  CLINICAL DATA:  Atrial fibrillation the past 2 weeks EXAM: CHEST  2 VIEW COMPARISON:  05/23/2014 FINDINGS: Left pacer  remains in place, unchanged. Heart is borderline in size. No confluent airspace opacities, effusions or edema. No acute bony abnormality. IMPRESSION: Borderline heart size.  No active disease or acute findings. Electronically Signed   By: Rolm Baptise M.D.   On: 06/20/2015 16:46   I have personally reviewed and evaluated these images and lab results as part of my medical decision-making.   EKG Interpretation None      MDM   Final diagnoses:  Vertigo  Chronic atrial fibrillation ([place])    Patient remains a symptomatically after by mouth meclizine. She is in nature fibrillation. This has been intermittent since her last cardiology visit. I do not think that this relates to her symptoms. Her symptoms are clearly vertiginous. She is otherwise tacked neurologically. She is anticoagulated. She is not 10 rapid response. She has a pacemaker for rate back up. Plan is vertigo precautions. Meclizine. Astra contact her physician on Monday to inquire about any change in herpersistenceofheratrialfibrillation.    Tanna Furry,  MD 06/20/15 [phone]

## 2015-06-20 NOTE — ED Notes (Signed)
Pt on automatic VS, continuous pulse ox and cardiac monitoring.

## 2015-06-20 NOTE — Discharge Instructions (Signed)
Atrial Fibrillation Atrial fibrillation is a type of heartbeat that is irregular or fast (rapid). If you have this condition, your heart keeps quivering in a weird (chaotic) way. This condition can make it so your heart cannot pump blood normally. Having this condition gives a person more risk for stroke, heart failure, and other heart problems. There are different types of atrial fibrillation. Talk with your doctor to learn about the type that you have. HOME CARE  Take over-the-counter and prescription medicines only as told by your doctor.  If your doctor prescribed a blood-thinning medicine, take it exactly as told. Taking too much of it can cause bleeding. If you do not take enough of it, you will not have the protection that you need against stroke and other problems.  Do not use any tobacco products. These include cigarettes, chewing tobacco, and e-cigarettes. If you need help quitting, ask your doctor.  If you have apnea (obstructive sleep apnea), manage it as told by your doctor.  Do not drink alcohol.  Do not drink beverages that have caffeine. These include coffee, soda, and tea.  Maintain a healthy weight. Do not use diet pills unless your doctor says they are safe for you. Diet pills may make heart problems worse.  Follow diet instructions as told by your doctor.  Exercise regularly as told by your doctor.  Keep all follow-up visits as told by your doctor. This is important. GET HELP IF:  You notice a change in the speed, rhythm, or strength of your heartbeat.  You are taking a blood-thinning medicine and you notice more bruising.  You get tired more easily when you move or exercise. GET HELP RIGHT AWAY IF:  You have pain in your chest or your belly (abdomen).  You have sweating or weakness.  You feel sick to your stomach (nauseous).  You notice blood in your throw up (vomit), poop (stool), or pee (urine).  You are short of breath.  You suddenly have swollen feet  and ankles.  You feel dizzy.  Your suddenly get weak or numb in your face, arms, or legs, especially if it happens on one side of your body.  You have trouble talking, trouble understanding, or both.  Your face or your eyelid droops on one side. These symptoms may be an emergency. Do not wait to see if the symptoms will go away. Get medical help right away. Call your local emergency services (911 in the [place] Do not drive yourself to the hospital.   This information is not intended to replace advice given to you by your health care provider. Make sure you discuss any questions you have with your health care provider.   Document Released: 11/25/2007 Document Revised: 11/06/2014 Document Reviewed: 06/12/2014 Elsevier Interactive Patient Education 2016 [employer] Inc[employer]  Benign Positional Vertigo Vertigo is the feeling that you or your surroundings are moving when they are not. Benign positional vertigo is the most common form of vertigo. The cause of this condition is not serious (is benign). This condition is triggered by certain movements and positions (is positional). This condition can be dangerous if it occurs while you are doing something that could endanger you or others, such as driving.  CAUSES In many cases, the cause of this condition is not known. It may be caused by a disturbance in an area of the inner ear that helps your brain to sense movement and balance. This disturbance can be caused by a viral infection (labyrinthitis), head injury, or repetitive motion.  RISK FACTORS This condition is more likely to develop in:  Women.  People who are 2 years of age or older. SYMPTOMS Symptoms of this condition usually happen when you move your head or your eyes in different directions. Symptoms may start suddenly, and they usually last for less than a minute. Symptoms may include:  Loss of balance and falling.  Feeling like you are spinning or moving.  Feeling like your surroundings  are spinning or moving.  Nausea and vomiting.  Blurred vision.  Dizziness.  Involuntary eye movement (nystagmus). Symptoms can be mild and cause only slight annoyance, or they can be severe and interfere with daily life. Episodes of benign positional vertigo may return (recur) over time, and they may be triggered by certain movements. Symptoms may improve over time. DIAGNOSIS This condition is usually diagnosed by medical history and a physical exam of the head, neck, and ears. You may be referred to a health care provider who specializes in ear, nose, and throat (ENT) problems (otolaryngologist) or a provider who specializes in disorders of the nervous system (neurologist). You may have additional testing, including:  MRI.  A CT scan.  Eye movement tests. Your health care provider may ask you to change positions quickly while he or she watches you for symptoms of benign positional vertigo, such as nystagmus. Eye movement may be tested with an electronystagmogram (ENG), caloric stimulation, the Dix-Hallpike test, or the roll test.  An electroencephalogram (EEG). This records electrical activity in your brain.  Hearing tests. TREATMENT Usually, your health care provider will treat this by moving your head in specific positions to adjust your inner ear back to normal. Surgery may be needed in severe cases, but this is rare. In some cases, benign positional vertigo may resolve on its own in 2-4 weeks. HOME CARE INSTRUCTIONS Safety  Move slowly.Avoid sudden body or head movements.  Avoid driving.  Avoid operating heavy machinery.  Avoid doing any tasks that would be dangerous to you or others if a vertigo episode would occur.  If you have trouble walking or keeping your balance, try using a cane for stability. If you feel dizzy or unstable, sit down right away.  Return to your normal activities as told by your health care provider. Ask your health care provider what activities are  safe for you. General Instructions  Take over-the-counter and prescription medicines only as told by your health care provider.  Avoid certain positions or movements as told by your health care provider.  Drink enough fluid to keep your urine clear or pale yellow.  Keep all follow-up visits as told by your health care provider. This is important. SEEK MEDICAL CARE IF:  You have a fever.  Your condition gets worse or you develop new symptoms.  Your family or friends notice any behavioral changes.  Your nausea or vomiting gets worse.  You have numbness or a "pins and needles" sensation. SEEK IMMEDIATE MEDICAL CARE IF:  You have difficulty speaking or moving.  You are always dizzy.  You faint.  You develop severe headaches.  You have weakness in your legs or arms.  You have changes in your hearing or vision.  You develop a stiff neck.  You develop sensitivity to light.   This information is not intended to replace advice given to you by your health care provider. Make sure you discuss any questions you have with your health care provider.   Document Released: 11/23/2005 Document Revised: 11/06/2014 Document Reviewed: 06/10/2014 Elsevier Interactive Patient  Education 2016 [employer].

## 2015-06-20 NOTE — ED Notes (Signed)
Pt able to ambulate short distance to restroom, States no dizziness upon standing and feels like normal.

## 2015-06-23 DIAGNOSIS — I509 Heart failure, unspecified: Secondary | ICD-10-CM | POA: Diagnosis not present

## 2015-06-23 DIAGNOSIS — I428 Other cardiomyopathies: Secondary | ICD-10-CM | POA: Diagnosis not present

## 2015-06-23 DIAGNOSIS — I482 Chronic atrial fibrillation: Secondary | ICD-10-CM | POA: Diagnosis not present

## 2015-06-23 DIAGNOSIS — E039 Hypothyroidism, unspecified: Secondary | ICD-10-CM | POA: Diagnosis not present

## 2015-06-23 DIAGNOSIS — I10 Essential (primary) hypertension: Secondary | ICD-10-CM | POA: Diagnosis not present

## 2015-06-23 DIAGNOSIS — I5022 Chronic systolic (congestive) heart failure: Secondary | ICD-10-CM | POA: Diagnosis not present

## 2015-07-22 ENCOUNTER — Other Ambulatory Visit: Payer: Self-pay | Admitting: Medical

## 2015-07-23 ENCOUNTER — Telehealth: Payer: Self-pay | Admitting: Medical

## 2015-07-24 NOTE — Telephone Encounter (Signed)
I refilled pt ramipril and sertraline. But I want her to come in within 1 month to check her cmp. Check k levels and kidney function.   Pt will need 30 minute appointment

## 2015-07-24 NOTE — Telephone Encounter (Signed)
LM for pt to call and schedule 30 min OV w/in 30 days

## 2015-07-25 MED ORDER — FLUTICASONE PROPIONATE 50 MCG/ACT NA SUSP: 1.0000 | Freq: Every day | NASAL | Status: DC

## 2015-07-25 NOTE — Telephone Encounter (Signed)
Rx sent to [facility] per pt request.

## 2015-07-25 NOTE — Telephone Encounter (Signed)
[facility] called in a refill request for the pt's nasal spray (fluticasone)    [phone]  Pharmacy: [facility], [place], [address]

## 2015-08-07 DIAGNOSIS — M2041 Other hammer toe(s) (acquired), right foot: Secondary | ICD-10-CM | POA: Diagnosis not present

## 2015-08-07 DIAGNOSIS — Z794 Long term (current) use of insulin: Secondary | ICD-10-CM | POA: Diagnosis not present

## 2015-08-07 DIAGNOSIS — I10 Essential (primary) hypertension: Secondary | ICD-10-CM | POA: Diagnosis not present

## 2015-08-07 DIAGNOSIS — M2042 Other hammer toe(s) (acquired), left foot: Secondary | ICD-10-CM | POA: Diagnosis not present

## 2015-08-07 DIAGNOSIS — E1142 Type 2 diabetes mellitus with diabetic polyneuropathy: Secondary | ICD-10-CM | POA: Diagnosis not present

## 2015-08-07 DIAGNOSIS — E782 Mixed hyperlipidemia: Secondary | ICD-10-CM | POA: Diagnosis not present

## 2015-08-07 DIAGNOSIS — E034 Atrophy of thyroid (acquired): Secondary | ICD-10-CM | POA: Diagnosis not present

## 2015-08-12 DIAGNOSIS — Z794 Long term (current) use of insulin: Secondary | ICD-10-CM | POA: Diagnosis not present

## 2015-08-12 DIAGNOSIS — E1142 Type 2 diabetes mellitus with diabetic polyneuropathy: Secondary | ICD-10-CM | POA: Diagnosis not present

## 2015-08-19 ENCOUNTER — Ambulatory Visit (HOSPITAL_BASED_OUTPATIENT_CLINIC_OR_DEPARTMENT_OTHER)
Admission: RE | Admit: 2015-08-19 | Discharge: 2015-08-19 | Disposition: A | Discharge status: Home / Self Care | Payer: Commercial Managed Care - HMO | Source: Ambulatory Visit | Attending: Physician Assistant | Admitting: Physician Assistant

## 2015-08-19 ENCOUNTER — Encounter: Payer: Self-pay | Admitting: Physician Assistant

## 2015-08-19 ENCOUNTER — Ambulatory Visit (INDEPENDENT_AMBULATORY_CARE_PROVIDER_SITE_OTHER): Payer: Commercial Managed Care - HMO | Admitting: Physician Assistant

## 2015-08-19 VITALS — BP 112/78 | HR 70 | Temp 97.9°F | Resp 16 | Ht 62.0 in | Wt 211.0 lb

## 2015-08-19 DIAGNOSIS — M47896 Other spondylosis, lumbar region: Secondary | ICD-10-CM | POA: Diagnosis not present

## 2015-08-19 DIAGNOSIS — M25552 Pain in left hip: Secondary | ICD-10-CM | POA: Diagnosis not present

## 2015-08-19 MED ORDER — CYCLOBENZAPRINE HCL 5 MG PO TABS: 5.0000 mg | ORAL_TABLET | Freq: Every day | ORAL | Status: DC

## 2015-08-19 NOTE — Addendum Note (Signed)
Addended by: Tasia Catchings on: 08/19/2015 10:58 AM   Modules accepted: Orders

## 2015-08-19 NOTE — Progress Notes (Signed)
Patient presents to clinic today c/o pain in L hip x 3 weeks starting after trying to get out of a kiddie pool. Denies trauma or injury but notes a popping sound in her hip. Since then pain has been present. Endorses pain as am aching sensation and is mostly constant. Pain is worse with ambulation and relieved with rest. Denies swelling, bruising, numbness or tingling. Endorses taking OTC Ibuprofen that is helping with pain.  Past Medical History  Diagnosis Date  . CHF (congestive heart failure) ([place])   . Diabetes mellitus without complication ([place])   . Atrial fibrillation ([place])   . Thyroid disease   . Allergy   . Anxiety   . Arthritis   . Asthma   . COPD (chronic obstructive pulmonary disease) ([place])   . Hyperlipidemia   . Hypertension   . Myocardial infarction [facility])     Current Outpatient Prescriptions on File Prior to Visit  Medication Sig Dispense Refill  . allopurinol (ZYLOPRIM) 100 MG tablet Take ONE (1) tablet by mouth once daily 30 tablet 1  . Apixaban (ELIQUIS PO) Take by mouth 2 (two) times daily.     Marland Kitchen atorvastatin (LIPITOR) 10 MG tablet Take 1 tablet (10 mg total) by mouth daily. 30 tablet 0  . beclomethasone (QVAR) 40 MCG/ACT inhaler Inhale 2 puffs into the lungs 2 (two) times daily. 1 Inhaler 3  . cloNIDine (CATAPRES) 0.1 MG tablet Take 0.1 mg by mouth 2 (two) times daily.    . fluticasone (FLONASE) 50 MCG/ACT nasal spray Place 1 spray into both nostrils daily. 16 g 1  . levothyroxine (SYNTHROID, LEVOTHROID) 175 MCG tablet Take 175 mcg by mouth daily before breakfast.    . loratadine (CLARITIN) 10 MG tablet Take 1 tablet (10 mg total) by mouth daily. 30 tablet 0  . meclizine (ANTIVERT) 25 MG tablet Take 1 tablet (25 mg total) by mouth 3 (three) times daily as needed. 20 tablet 0  . metoprolol succinate (TOPROL-XL) 25 MG 24 hr tablet Take 1 tablet (25 mg total) by mouth daily. 30 tablet 3  . Prenatal Vit-Fe Fumarate-FA (MULTIVITAMIN-PRENATAL) 27-0.8 MG TABS tablet Take  1 tablet by mouth daily at 12 noon. Takes for nail and hair growth    . ramipril (ALTACE) 2.5 MG capsule Take ONE (1)  capsule by mouth once daily 30 capsule 3  . sertraline (ZOLOFT) 50 MG tablet Take ONE (1) tablet by mouth once daily 30 tablet 3  . spironolactone (ALDACTONE) 25 MG tablet Take ONE (1) tablet by mouth once daily 30 tablet 1  . torsemide (DEMADEX) 20 MG tablet Take 20 mg by mouth daily.      No current facility-administered medications on file prior to visit.    Allergies  Allergen Reactions  . Pregabalin     Other reaction(s): DIZZINESS  . Sulfa Antibiotics Hives  . Indomethacin     Other reaction(s): Other (See Comments) Renal Insufficiency    Family History  Problem Relation Age of Onset  . Diabetes Mother   . Hyperlipidemia Mother   . Diabetes Father   . Hyperlipidemia Father   . Heart disease Father     before age 31  . Heart attack Father   . Diabetes Brother   . Hyperlipidemia Brother   . Heart attack Brother     Social History   Social History  . Marital Status: Married    Spouse Name: N/A  . Number of Children: N/A  . Years of Education: N/A  Social History Main Topics  . Smoking status: Former Smoker    Quit date: 03/01/1998  . Smokeless tobacco: Never Used  . 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  . Drug Use: No  . Sexual Activity: No   Other Topics Concern  . None   Social History Narrative   Review of Systems - See HPI.  All other ROS are negative.  Ht 5' 2"  (1.575 m)  Wt 211 lb (95.709 kg)  BMI 38.58 kg/m2  Physical Exam  Constitutional: She is oriented to person, place, and time and well-developed, well-nourished, and in no distress.  HENT:  Head: Normocephalic and atraumatic.  Eyes: Conjunctivae are normal.  Cardiovascular: Normal rate, regular rhythm, normal heart sounds and intact distal pulses.   Pulmonary/Chest: Effort normal and breath sounds normal. No respiratory  distress. She has no wheezes. She has no rales. She exhibits no tenderness.  Musculoskeletal:       Left hip: She exhibits normal range of motion, normal strength and no tenderness.       Lumbar back: Normal.  Pain with abduction of the L hip. No pain with adduction, internal or external rotation.  Neurological: She is alert and oriented to person, place, and time.  Skin: Skin is warm and dry. No rash noted.  Vitals reviewed.   Recent Results (from the past 2160 hour(s))  CBC with Differential/Platelet     Status: Abnormal   Collection Time: 06/20/15  4:30 PM  Result Value Ref Range   WBC 4.1 4.0 - 10.5 K/uL   RBC 3.97 3.87 - 5.11 MIL/uL   Hemoglobin 13.2 12.0 - 15.0 g/dL   HCT 37.7 36.0 - 46.0 %   MCV 95.0 78.0 - 100.0 fL   MCH 33.2 26.0 - 34.0 pg   MCHC 35.0 30.0 - 36.0 g/dL   RDW 13.5 11.5 - 15.5 %   Platelets 66 (L) 150 - 400 K/uL    Comment: REPEATED TO VERIFY PLATELET COUNT CONFIRMED BY SMEAR    Neutrophils Relative % 66 %   Neutro Abs 2.7 1.7 - 7.7 K/uL   Lymphocytes Relative 26 %   Lymphs Abs 1.1 0.7 - 4.0 K/uL   Monocytes Relative 6 %   Monocytes Absolute 0.3 0.1 - 1.0 K/uL   Eosinophils Relative 1 %   Eosinophils Absolute 0.1 0.0 - 0.7 K/uL   Basophils Relative 0 %   Basophils Absolute 0.0 0.0 - 0.1 K/uL  Basic metabolic panel     Status: Abnormal   Collection Time: 06/20/15  4:30 PM  Result Value Ref Range   Sodium 135 135 - 145 mmol/L   Potassium 3.6 3.5 - 5.1 mmol/L   Chloride 104 101 - 111 mmol/L   CO2 23 22 - 32 mmol/L   Glucose, Bld 289 (H) 65 - 99 mg/dL   BUN 24 (H) 6 - 20 mg/dL   Creatinine, Ser 0.77 0.44 - 1.00 mg/dL   Calcium 9.1 8.9 - 10.3 mg/dL   GFR calc non Af Amer >60 >60 mL/min   GFR calc Af Amer >60 >60 mL/min    Comment: (NOTE) The eGFR has been calculated using the CKD EPI equation. This calculation has not been validated in all clinical situations. eGFR's persistently <60 mL/min signify possible Chronic Kidney Disease.    Anion gap  8 5 - 15    Assessment/Plan: 1. Left hip pain Giving chronicity and likely OA involvement, will check x-ray  today. There is likely strain of abductors. Supportive measures reviewed. Aleve as directed. Rx Flexeril at 5 mg to take in the evening. Heating pad recommended. Start water aerobics.   - DG HIP INFANT UNILAT W OR W/O PELVIS 2-3 VIEWS LEFT; Future   Leeanne Rio, PA-C

## 2015-08-19 NOTE — Progress Notes (Signed)
Pre visit review using our clinic review tool, if applicable. No additional management support is needed unless otherwise documented below in the visit note/SLS  

## 2015-08-19 NOTE — Addendum Note (Signed)
Addended by: Raiford Noble on: 08/19/2015 10:42 AM   Modules accepted: Orders

## 2015-08-19 NOTE — Addendum Note (Signed)
Addended by: Tasia Catchings on: 08/19/2015 10:54 AM   Modules accepted: Orders

## 2015-08-19 NOTE — Patient Instructions (Signed)
Please go downstairs for imaging. We will call with your results. Take the Aleve as directed for pain. Apply heating pad to the area for 10 minutes a few times per day. Restart your water aerobics. Avoid resistance training of the legs until this calms down. We will alter the treatment plan based on x-ray results.

## 2015-08-26 ENCOUNTER — Ambulatory Visit (HOSPITAL_BASED_OUTPATIENT_CLINIC_OR_DEPARTMENT_OTHER): Payer: Commercial Managed Care - HMO

## 2015-08-26 ENCOUNTER — Other Ambulatory Visit (HOSPITAL_BASED_OUTPATIENT_CLINIC_OR_DEPARTMENT_OTHER): Payer: Commercial Managed Care - HMO

## 2015-08-26 ENCOUNTER — Other Ambulatory Visit: Payer: Self-pay | Admitting: Medical

## 2015-09-04 ENCOUNTER — Ambulatory Visit (HOSPITAL_BASED_OUTPATIENT_CLINIC_OR_DEPARTMENT_OTHER): Payer: Commercial Managed Care - HMO

## 2015-09-04 ENCOUNTER — Other Ambulatory Visit (HOSPITAL_BASED_OUTPATIENT_CLINIC_OR_DEPARTMENT_OTHER): Payer: Commercial Managed Care - HMO

## 2015-09-15 ENCOUNTER — Other Ambulatory Visit: Payer: Self-pay | Admitting: Medical

## 2015-09-16 ENCOUNTER — Other Ambulatory Visit: Payer: Self-pay | Admitting: *Deleted

## 2015-09-16 ENCOUNTER — Other Ambulatory Visit: Payer: Self-pay

## 2015-09-16 MED ORDER — APIXABAN 2.5 MG PO TABS: 2.5000 mg | ORAL_TABLET | Freq: Two times a day (BID) | ORAL | Status: DC

## 2015-09-16 MED ORDER — FLUTICASONE PROPIONATE 50 MCG/ACT NA SUSP: NASAL | Status: DC

## 2015-09-16 NOTE — Telephone Encounter (Signed)
Unable to reach pt by phone(pt not taking calls).  Received refill request for the Torsemide 59m.  Need to know from the pt was dose is she on and the directions.//AB/CMA

## 2015-09-16 NOTE — Telephone Encounter (Signed)
Spoke with pharmacist at [facility] and Allopurinol was sent in on 09/15/15.

## 2015-09-18 ENCOUNTER — Other Ambulatory Visit: Payer: Self-pay

## 2015-09-18 MED ORDER — RAMIPRIL 2.5 MG PO CAPS: ORAL_CAPSULE | ORAL | Status: DC

## 2015-09-18 MED ORDER — APIXABAN 2.5 MG PO TABS: 2.5000 mg | ORAL_TABLET | Freq: Two times a day (BID) | ORAL | Status: DC

## 2015-09-18 MED ORDER — METOPROLOL SUCCINATE ER 25 MG PO TB24: 25.0000 mg | ORAL_TABLET | Freq: Every day | ORAL | Status: DC

## 2015-09-18 MED ORDER — ALLOPURINOL 100 MG PO TABS: ORAL_TABLET | ORAL | Status: DC

## 2015-09-18 MED ORDER — SPIRONOLACTONE 25 MG PO TABS: ORAL_TABLET | ORAL | Status: DC

## 2015-09-18 MED ORDER — SERTRALINE HCL 50 MG PO TABS: ORAL_TABLET | ORAL | Status: DC

## 2015-09-18 NOTE — Telephone Encounter (Signed)
I sent in her refills. But did not refill diuretics. She needs to have office. She needs 30 minute appointment. Send her a letter please

## 2015-09-18 NOTE — Telephone Encounter (Signed)
Interacting Medications/Orders:  Potassium-Sparing Diuretics Oral or Non-Oral, Systemic ACE Inhibitors Oral or Non-Oral, Systemic  1. spironolactone Order ([id number]): spironolactone (ALDACTONE) 25 MG tablet Route: none Start: 09/18/2015 End: none Frequency:  1. ramipril Order: ramipril (ALTACE) 2.5 MG capsule Route: none Start: 09/18/2015 End: none Frequency:    Management Code: Professional review suggested  Effects: Hyperkalemia, possibly with cardiac arrhythmias or arrest may occur with the combination of potassium-sparing diuretics and ACE inhibitors. Patients with renal impairment, diabetes, older age, severe heart failure, and risk for dehydration may be at greater risk.  Mechanism: Decreased aldosterone activity by ACE inhibitors may function synergistically with potassium retention by potassium-sparing diuretics to produce substantial hyperkalemia.  Management: Monitor serum potassium concentrations and renal function closely in patients receiving ACE inhibitors and potassium-sparing diuretics. If hyperkalemia develops, stop one or both drugs.  Please advise on the interaction.

## 2015-09-18 NOTE — Telephone Encounter (Signed)
Rx for Spironoloactone 25 mg tablet sent to [facility]. Pt also was asked to call back and clarify what dose of the medication Torsemide she was taking and how often she was taking the medication so that her pcp could fill the medication. Pt will also need an appointment and we will need recent blood work as well.

## 2015-09-19 NOTE — Telephone Encounter (Signed)
Patient called back to inform that she takes Torsemide 20 mgs QD.

## 2015-09-19 NOTE — Telephone Encounter (Signed)
Letter mailed to pt to call and schedule a follow up.

## 2015-09-19 NOTE — Telephone Encounter (Signed)
Called and [facility] @ 3:42pm @ [phone]) asking the pt to RTC regarding medication refill request.//AB/CMA

## 2015-09-25 ENCOUNTER — Encounter: Payer: Self-pay | Admitting: Medical

## 2015-09-25 ENCOUNTER — Ambulatory Visit (INDEPENDENT_AMBULATORY_CARE_PROVIDER_SITE_OTHER): Payer: Commercial Managed Care - HMO | Admitting: Medical

## 2015-09-25 VITALS — BP 120/78 | HR 96 | Temp 98.1°F | Ht 62.0 in | Wt 215.2 lb

## 2015-09-25 DIAGNOSIS — J3489 Other specified disorders of nose and nasal sinuses: Secondary | ICD-10-CM

## 2015-09-25 DIAGNOSIS — I509 Heart failure, unspecified: Secondary | ICD-10-CM

## 2015-09-25 DIAGNOSIS — R519 Headache, unspecified: Secondary | ICD-10-CM

## 2015-09-25 DIAGNOSIS — J309 Allergic rhinitis, unspecified: Secondary | ICD-10-CM | POA: Diagnosis not present

## 2015-09-25 DIAGNOSIS — R51 Headache: Secondary | ICD-10-CM

## 2015-09-25 DIAGNOSIS — I4891 Unspecified atrial fibrillation: Secondary | ICD-10-CM | POA: Diagnosis not present

## 2015-09-25 MED ORDER — CEPHALEXIN 500 MG PO CAPS: 500.0000 mg | ORAL_CAPSULE | Freq: Two times a day (BID) | ORAL | 0 refills | Status: DC

## 2015-09-25 MED ORDER — AZELASTINE HCL 0.1 % NA SOLN: 2.0000 | Freq: Two times a day (BID) | NASAL | 3 refills | Status: DC

## 2015-09-25 NOTE — Patient Instructions (Signed)
I thinks you have some allergies recently so continue flonase and will astelin spray. If your frontal sinus pain returns then start keflex.  Since you reported some temporal pain last week and faint pain presently will get sed rate today.  For hx of chf, afib, and now have pacemaker will refer you to new cardiologist.  If you have to take antibiotics and don't improve will consider ct of your sinus. If you have severe ha could get ct of head. You have concern of years that intermittent frontal ha/sinus pain over years could be serious/dangerous. I don't think this is case presently but we need to follow you closely. If any ha with neurologic type signs or symptoms then ED evaluation.

## 2015-09-25 NOTE — Progress Notes (Signed)
Subjective:    Patient ID: Alexis [name], female    DOB: 12-Aug-1950, 65 y.o.   MRN: [id number]  HPI   Pt in for some rt side pain near rt eye brow area that runs to rt frontal area that radiates towards her rt ear. Also some rt ear pain. Pt states since 1989 she had this region pain. Back then she reports some facial trauma and fracture of orbit of her rt eye. So over the years she has some pain in this region rare and intermittent.   Pt states recently this pain above returns this past Tuesday(over years rare ant intermittent). Pt stares frontal ha/sinus pain since past Tuesday but today not having any pain today. Pt denies any sneezing or itching eye. But she has a lot post nasal drainage every day. Pt has been using flonase. On Tuesday had faint dizziness but non since then.So today really not having any significant symptoms.  No report of fever, chills or sweats.   Pt needs new cardiolgist. She has hx of chf, CAD, a-fibrillation and  Pacemaker.   Pt mentioned last week when frontal sinus/ha region occurred she had faint temporal area pain. None reported now but faint rt temporal tenderness now.   Review of Systems  Constitutional: Negative for chills, fatigue and fever.  HENT: Positive for ear pain, postnasal drip, rhinorrhea and sneezing. Negative for congestion, mouth sores, sinus pressure and sore throat.        See hpi occasional rt frontal area pain. But not now.  No ear pain presently.  Gastrointestinal: Negative for abdominal pain.  Neurological: Positive for dizziness. Negative for tremors, syncope, facial asymmetry, speech difficulty, weakness and light-headedness.       None now but transient on occasion.  Hematological: Negative for adenopathy. Does not bruise/bleed easily.    Past Medical History:  Diagnosis Date  . Allergy   . Anxiety   . Arthritis   . Asthma   . Atrial fibrillation ([place])   . CHF (congestive heart failure) ([place])   . COPD (chronic  obstructive pulmonary disease) ([place])   . Diabetes mellitus without complication ([place])   . Hyperlipidemia   . Hypertension   . Myocardial infarction ([place])   . Thyroid disease      Social History   Social History  . Marital status: Married    Spouse name: N/A  . Number of children: N/A  . Years of education: N/A   Occupational History  . Not on file.   Social History Main Topics  . Smoking status: Former Smoker    Quit date: 03/01/1998  . Smokeless tobacco: Never Used  . 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  . Drug use: No  . Sexual activity: No   Other Topics Concern  . Not on file   Social History Narrative  . No narrative on file    Past Surgical History:  Procedure Laterality Date  . CORONARY ARTERY BYPASS GRAFT    . TOTAL KNEE ARTHROPLASTY      Family History  Problem Relation Age of Onset  . Diabetes Mother   . Hyperlipidemia Mother   . Diabetes Father   . Hyperlipidemia Father   . Heart disease Father     before age 67  . Heart attack Father   . Diabetes Brother   . Hyperlipidemia Brother   . Heart attack Brother     Allergies  Allergen Reactions  .  Pregabalin     Other reaction(s): DIZZINESS  . Sulfa Antibiotics Hives  . Indomethacin     Other reaction(s): Other (See Comments) Renal Insufficiency    Current Outpatient Prescriptions on File Prior to Visit  Medication Sig Dispense Refill  . allopurinol (ZYLOPRIM) 100 MG tablet Take ONE (1) tablet by mouth once daily 90 tablet 3  . apixaban (ELIQUIS) 2.5 MG TABS tablet Take 1 tablet (2.5 mg total) by mouth 2 (two) times daily. 180 tablet 1  . atorvastatin (LIPITOR) 10 MG tablet Take 1 tablet (10 mg total) by mouth daily. 30 tablet 0  . beclomethasone (QVAR) 40 MCG/ACT inhaler Inhale 2 puffs into the lungs 2 (two) times daily. 1 Inhaler 3  . fluticasone (FLONASE) 50 MCG/ACT nasal spray Place 1 spray into both nostrils daily. 16 g 3  . levothyroxine  (SYNTHROID, LEVOTHROID) 175 MCG tablet Take 175 mcg by mouth daily before breakfast.    . meclizine (ANTIVERT) 25 MG tablet Take 1 tablet (25 mg total) by mouth 3 (three) times daily as needed. 20 tablet 0  . metoprolol succinate (TOPROL-XL) 25 MG 24 hr tablet Take 1 tablet (25 mg total) by mouth daily. 90 tablet 3  . Prenatal Vit-Fe Fumarate-FA (MULTIVITAMIN-PRENATAL) 27-0.8 MG TABS tablet Take 1 tablet by mouth daily at 12 noon. Takes for nail and hair growth    . ramipril (ALTACE) 2.5 MG capsule Take ONE (1)  capsule by mouth once daily 90 capsule 0  . sertraline (ZOLOFT) 50 MG tablet Take ONE (1) tablet by mouth once daily 90 tablet 3  . spironolactone (ALDACTONE) 25 MG tablet Take ONE (1) tablet by mouth once daily 90 tablet 1  . torsemide (DEMADEX) 20 MG tablet Take 20 mg by mouth daily.     . cloNIDine (CATAPRES) 0.1 MG tablet Take 0.1 mg by mouth 2 (two) times daily. Reported on 08/19/2015    . cyclobenzaprine (FLEXERIL) 5 MG tablet Take 1 tablet (5 mg total) by mouth at bedtime. (Patient not taking: Reported on 09/25/2015) 15 tablet 0  . loratadine (CLARITIN) 10 MG tablet Take 1 tablet (10 mg total) by mouth daily. (Patient not taking: Reported on 09/25/2015) 30 tablet 0   No current facility-administered medications on file prior to visit.     BP 120/78 (BP Location: Right Arm, Patient Position: Sitting, Cuff Size: Large)   Pulse 96   Temp 98.1 F (36.7 C) (Oral)   Ht 5' 2"  (1.575 m)   Wt 215 lb 4 oz (97.6 kg)   BMI 39.37 kg/m       Objective:   Physical Exam   General  Mental Status - Alert. General Appearance - Well groomed. Not in acute distress.  Skin Rashes- No Rashes.  HEENT Head- Normal.(faint minimal rt temporal area pain) Ear Auditory Canal - Left- Normal. Right - Normal.Tympanic Membrane- Left- Normal. Right- Normal. Eye Sclera/Conjunctiva- Left- Normal. Right- Normal. Nose & Sinuses Nasal Mucosa- Left-  Boggy and Congested. Right-  Boggy and   Congested.Bilateral no  maxillary and no  frontal sinus pressure presently) Mouth & Throat Lips: Upper Lip- Normal: no dryness, cracking, pallor, cyanosis, or vesicular eruption. Lower Lip-Normal: no dryness, cracking, pallor, cyanosis or vesicular eruption. Buccal Mucosa- Bilateral- No Aphthous ulcers. Oropharynx- No Discharge or Erythema. Tonsils: Characteristics- Bilateral- No Erythema or Congestion. Size/Enlargement- Bilateral- No enlargement. Discharge- bilateral-None.  Neck Neck- Supple. No Masses.   Chest and Lung Exam Auscultation: Breath Sounds:-Clear even and unlabored.  Cardiovascular Auscultation:Rythm- Regular, rate and rhythm. Murmurs &  Other Heart Sounds:Ausculatation of the heart reveal- No Murmurs.  Lymphatic Head & Neck General Head & Neck Lymphatics: Bilateral: Description- No Localized lymphadenopathy.   Neurologic Cranial Nerve exam:- CN III-XII intact(No nystagmus), symmetric smile. Strength:- 5/5 equal and symmetric strength both upper and lower extremities.      Assessment & Plan:  I thinks you have some allergies recently so continue flonase and will astelin spray. If your frontal sinus pain returns then start keflex.  Since you reported some temporal pain last week and faint pain presently will get sed rate today.  For hx of chf, afib, and now have pacemaker will refer you to new cardiologist.  If you have to take antibiotics and don't improve will consider ct of your sinus. If you have severe ha could get ct of head. You have concern of years that intermittent frontal ha/sinus pain over years could be serious/dangerous. I don't think this is case presently but we need to follow you closely. If any ha with neurologic type signs or symptoms then ED evaluation.   Sera Hitsman, Percell Miller, PA-C

## 2015-09-25 NOTE — Progress Notes (Signed)
Pre visit review using our clinic review tool, if applicable. No additional management support is needed unless otherwise documented below in the visit note. 

## 2015-09-26 LAB — SEDIMENTATION RATE: SED RATE: 7 mm/h (ref 0–30)

## 2015-09-29 DIAGNOSIS — E1142 Type 2 diabetes mellitus with diabetic polyneuropathy: Secondary | ICD-10-CM | POA: Diagnosis not present

## 2015-09-29 DIAGNOSIS — M2040 Other hammer toe(s) (acquired), unspecified foot: Secondary | ICD-10-CM | POA: Diagnosis not present

## 2015-10-01 NOTE — Telephone Encounter (Signed)
Spoke with Neoma Laming at [facility] and she states that the patients medication for Flonase and Eliquis 2.5 mg was delivered on 09/22/15. The Allopurinol was placed for order on 10/01/15 and the confirmation number is as follows: [id number]. Per Neoma Laming at [facility] the cost to the pt will be $3.30 for a 90 day supply and it will be sent out in 7-10 business days.

## 2015-10-06 ENCOUNTER — Other Ambulatory Visit: Payer: Self-pay

## 2015-10-06 MED ORDER — TORSEMIDE 10 MG PO TABS: 10.0000 mg | ORAL_TABLET | Freq: Every day | ORAL | 1 refills | Status: DC

## 2015-10-06 NOTE — Addendum Note (Signed)
Addended by: Tasia Catchings on: 10/06/2015 09:05 AM   Modules accepted: Orders

## 2015-10-09 ENCOUNTER — Ambulatory Visit: Payer: Commercial Managed Care - HMO | Admitting: Medical

## 2015-10-30 ENCOUNTER — Encounter: Payer: Self-pay | Admitting: *Deleted

## 2015-11-17 ENCOUNTER — Ambulatory Visit (INDEPENDENT_AMBULATORY_CARE_PROVIDER_SITE_OTHER): Payer: Commercial Managed Care - HMO | Admitting: Interventional Cardiology

## 2015-11-17 ENCOUNTER — Encounter: Payer: Self-pay | Admitting: Interventional Cardiology

## 2015-11-17 VITALS — BP 130/80 | HR 88 | Ht 62.0 in | Wt 219.0 lb

## 2015-11-17 DIAGNOSIS — I4891 Unspecified atrial fibrillation: Secondary | ICD-10-CM | POA: Diagnosis not present

## 2015-11-17 DIAGNOSIS — Z794 Long term (current) use of insulin: Secondary | ICD-10-CM

## 2015-11-17 DIAGNOSIS — I10 Essential (primary) hypertension: Secondary | ICD-10-CM

## 2015-11-17 DIAGNOSIS — E1159 Type 2 diabetes mellitus with other circulatory complications: Secondary | ICD-10-CM

## 2015-11-17 DIAGNOSIS — E785 Hyperlipidemia, unspecified: Secondary | ICD-10-CM | POA: Diagnosis not present

## 2015-11-17 DIAGNOSIS — I509 Heart failure, unspecified: Secondary | ICD-10-CM | POA: Diagnosis not present

## 2015-11-17 MED ORDER — APIXABAN 5 MG PO TABS: 5.0000 mg | ORAL_TABLET | Freq: Two times a day (BID) | ORAL | 6 refills | Status: DC

## 2015-11-17 NOTE — Progress Notes (Signed)
Cardiology Office Note   Date:  11/17/2015   ID:  Alexis [name], DOB 1950-09-06, MRN [id number]  PCP:  Mackie Pai, PA-C    No chief complaint on file. AFib   Wt Readings from Last 3 Encounters:  11/17/15 219 lb (99.3 kg)  09/25/15 215 lb 4 oz (97.6 kg)  08/19/15 211 lb (95.7 kg)       History of Present Illness: Alexis [name] is a 65 y.o. female  Y/o who has had AFib diagnosed in 2000.  She felt palpitations.  She had been drinking heavily at the time.  SHe was followed by Dr. Mahala Menghini in [facility].  He was no longer accepting her insurance so she is here to establish with a new cardiologist.    In 2000, she was diagnosed with Graves disease and she was treated with radioactive iodine.  She considered AFib ablation in 2000, but they decided to wait.  In 2005, she had an AFib ablation at [facility].  Since that time, she has been asymptomatic.    She was started on low dose Eliquis last year for stroke prevention.    She had heart failure.  She has been diuresed.  She has had a pacer.   She has had a heart cath several times.  No CAD detected per her report.  She has an insulin pump for DM control.     Past Medical History:  Diagnosis Date  . Allergy   . Anxiety   . Arthritis   . Asthma   . Atrial fibrillation ([place])   . CHF (congestive heart failure) ([place])   . COPD (chronic obstructive pulmonary disease) ([place])   . Diabetes mellitus without complication ([place])   . Hyperlipidemia   . Hypertension   . Myocardial infarction ([place])   . Thyroid disease     Past Surgical History:  Procedure Laterality Date  . CORONARY ARTERY BYPASS GRAFT    . TOTAL KNEE ARTHROPLASTY       Current Outpatient Prescriptions  Medication Sig Dispense Refill  . allopurinol (ZYLOPRIM) 100 MG tablet Take ONE (1) tablet by mouth once daily 90 tablet 3  . apixaban (ELIQUIS) 2.5 MG TABS tablet Take 1 tablet (2.5 mg total) by mouth 2 (two) times daily. 180 tablet 1  .  atorvastatin (LIPITOR) 10 MG tablet Take 1 tablet (10 mg total) by mouth daily. 30 tablet 0  . azelastine (ASTELIN) 0.1 % nasal spray Place 2 sprays into both nostrils 2 (two) times daily. Use in each nostril as directed 30 mL 3  . beclomethasone (QVAR) 40 MCG/ACT inhaler Inhale 2 puffs into the lungs 2 (two) times daily. 1 Inhaler 3  . Blood Glucose Monitoring Suppl (GLUCOCOM BLOOD GLUCOSE MONITOR) DEVI Use to check blood sugars 6 times daily E11.65    . fluticasone (FLONASE) 50 MCG/ACT nasal spray Place 1 spray into both nostrils daily. 16 g 3  . Insulin Pen Needle (B-D ULTRAFINE III SHORT PEN) 31G X 8 MM MISC Use for insulin pen up to five times daily E11.65    . levothyroxine (SYNTHROID, LEVOTHROID) 175 MCG tablet Take 175 mcg by mouth daily before breakfast.    . metoprolol succinate (TOPROL-XL) 25 MG 24 hr tablet Take 1 tablet (25 mg total) by mouth daily. 90 tablet 3  . NOVOLOG 100 UNIT/ML injection Inject 40 Units into the skin 3 (three) times daily with meals.     . Prenatal Vit-Fe Fumarate-FA (MULTIVITAMIN-PRENATAL) 27-0.8 MG TABS tablet  Take 1 tablet by mouth daily at 12 noon. Takes for nail and hair growth    . ramipril (ALTACE) 2.5 MG capsule Take ONE (1)  capsule by mouth once daily 90 capsule 0  . sertraline (ZOLOFT) 50 MG tablet Take ONE (1) tablet by mouth once daily 90 tablet 3  . spironolactone (ALDACTONE) 25 MG tablet Take ONE (1) tablet by mouth once daily 90 tablet 1  . torsemide (DEMADEX) 10 MG tablet Take 1 tablet (10 mg total) by mouth daily. 90 tablet 1   No current facility-administered medications for this visit.     Allergies:   Allopurinol; Indomethacin; Pregabalin; Sulfa antibiotics; and Sulfamethoxazole    Social History:  The patient  reports that she quit smoking about 17 years ago. She has never used smokeless tobacco. She reports that she does not drink alcohol or use drugs.   Family History:  The patient's family history includes Diabetes in her brother,  father, and mother; Heart attack in her brother and father; Heart disease in her father; Hyperlipidemia in her brother, father, and mother.    ROS:  Please see the history of present illness.   Otherwise, review of systems are positive for intentional weight loss.   All other systems are reviewed and negative.    PHYSICAL EXAM: VS:  BP 130/80   Pulse 88   Ht 5' 2"  (1.575 m)   Wt 219 lb (99.3 kg)   BMI 40.06 kg/m  , BMI Body mass index is 40.06 kg/m. GEN: Well nourished, well developed, in no acute distress  HEENT: normal  Neck: no JVD, carotid bruits, or masses Cardiac: RRR; no murmurs, rubs, or gallops,no edema  Respiratory:  clear to auscultation bilaterally, normal work of breathing GI: soft, nontender, nondistended, + BS, obese MS: no deformity or atrophy  Skin: warm and dry, no rash Neuro:  Strength and sensation are intact Psych: euthymic mood, full affect   EKG:   The ekg ordered today demonstrates atrial fibrillation, atrial pacing, controlled heart rate, NSST   Recent Labs: 04/10/2015: ALT 44; TSH 5.63 06/20/2015: BUN 24; Creatinine, Ser 0.77; Hemoglobin 13.2; Platelets 66; Potassium 3.6; Sodium 135   Lipid Panel    Component Value Date/Time   CHOL 184 04/10/2015 1040   TRIG 254.0 (H) 04/10/2015 1040   HDL 33.10 (L) 04/10/2015 1040   CHOLHDL 6 04/10/2015 1040   VLDL 50.8 (H) 04/10/2015 1040   LDLDIRECT 111.0 04/10/2015 1040     Other studies Reviewed: Additional studies/ records that were reviewed today with results demonstrating: Outside records mentioning heart failure, as well as ICD implantation although the patient states she only has a pacemaker.   ASSESSMENT AND PLAN:  1. AFib : Rate controlled. Given her age, weight and renal function, she will be better protected for again stroke with Eliquis 5 mg twice a day. This was discussed with the Pharm.D. Will increase dose. Check echocardiogram to evaluate baseline left ventricular function. 2. Obesity: She  has lost more than 80 pounds in the past year. I encouraged her to continue her lifestyle modifications. 3. Hyperlipidemia: Continue atorvastatin.  4. We'll establish the patient with pacemaker clinic. 5. Follow-up in 6 months to check on weight loss and any signs of heart failure. 6. HTN/DM: Continue medications and lifestyle changes to aggressively control these 2 conditions.   Current medicines are reviewed at length with the patient today.  The patient concerns regarding her medicines were addressed.  The following changes have been made:  Increase  Eliquis  Labs/ tests ordered today include:  No orders of the defined types were placed in this encounter.   Recommend 150 minutes/week of aerobic exercise Low fat, low carb, high fiber diet recommended  Disposition:   FU in 6 months   Signed, Larae Grooms, MD  11/17/2015 2:51 PM    [facility] [address], [place], [place]  [address] Phone: [phone]; Fax: [phone]

## 2015-11-17 NOTE — Patient Instructions (Signed)
**Note De-Identified Shatonya Passon Obfuscation** Medication Instructions:  Increase Eliquis to 5 mg twice daily. All other medications remain the same.  Labwork: None  Testing/Procedures: Your physician has requested that you have an echocardiogram. Echocardiography is a painless test that uses sound waves to create images of your heart. It provides your doctor with information about the size and shape of your heart and how well your heart's chambers and valves are working. This procedure takes approximately one hour. There are no restrictions for this procedure.  Follow-Up: Pt needs to be enrolled in our [facility]. Follow up with Dr Irish Lack in 6 months.      If you need a refill on your cardiac medications before your next appointment, please call your pharmacy.

## 2015-11-18 DIAGNOSIS — E785 Hyperlipidemia, unspecified: Secondary | ICD-10-CM | POA: Insufficient documentation

## 2015-11-26 ENCOUNTER — Ambulatory Visit (HOSPITAL_BASED_OUTPATIENT_CLINIC_OR_DEPARTMENT_OTHER)
Admission: RE | Admit: 2015-11-26 | Discharge: 2015-11-26 | Disposition: A | Discharge status: Home / Self Care | Payer: Commercial Managed Care - HMO | Source: Ambulatory Visit | Attending: Interventional Cardiology | Admitting: Interventional Cardiology

## 2015-11-26 DIAGNOSIS — E785 Hyperlipidemia, unspecified: Secondary | ICD-10-CM | POA: Insufficient documentation

## 2015-11-26 DIAGNOSIS — Z6841 Body Mass Index (BMI) 40.0 and over, adult: Secondary | ICD-10-CM | POA: Diagnosis not present

## 2015-11-26 DIAGNOSIS — E119 Type 2 diabetes mellitus without complications: Secondary | ICD-10-CM | POA: Diagnosis not present

## 2015-11-26 DIAGNOSIS — I119 Hypertensive heart disease without heart failure: Secondary | ICD-10-CM | POA: Insufficient documentation

## 2015-11-26 DIAGNOSIS — I4891 Unspecified atrial fibrillation: Secondary | ICD-10-CM | POA: Insufficient documentation

## 2015-11-26 DIAGNOSIS — E669 Obesity, unspecified: Secondary | ICD-10-CM | POA: Diagnosis not present

## 2015-11-26 NOTE — Progress Notes (Signed)
  Echocardiogram 2D Echocardiogram has been performed.  Alexis [name] 11/26/2015, 10:44 AM

## 2015-12-03 ENCOUNTER — Encounter: Payer: Commercial Managed Care - HMO | Admitting: Cardiology

## 2015-12-03 NOTE — Progress Notes (Deleted)
Electrophysiology Office Note   Date:  12/03/2015   ID:  Alexis [name], DOB 03-19-1950, MRN [id number]  PCP:  Elise Benne  Cardiologist:  Irish Lack Primary Electrophysiologist:  Constance Haw, MD    No chief complaint on file.    History of Present Illness: Alexis [name] is a 65 y.o. female who presents today for electrophysiology evaluation.   History of AFib diagnosed in 2000.  She felt palpitations.  She had been drinking heavily at the time. History of Graves disease treated with radioactive iodine in 2000.  AF ablation in [facility] in 2005.   Today, she denies*** symptoms of palpitations, chest pain, shortness of breath, orthopnea, PND, lower extremity edema, claudication, dizziness, presyncope, syncope, bleeding, or neurologic sequela. The patient is tolerating medications without difficulties and is otherwise without complaint today.    Past Medical History:  Diagnosis Date  . Allergy   . Anxiety   . Arthritis   . Asthma   . Atrial fibrillation ([place])   . CHF (congestive heart failure) ([place])   . COPD (chronic obstructive pulmonary disease) ([place])   . Diabetes mellitus without complication ([place])   . Hyperlipidemia   . Hypertension   . Myocardial infarction   . Thyroid disease    Past Surgical History:  Procedure Laterality Date  . CORONARY ARTERY BYPASS GRAFT    . TOTAL KNEE ARTHROPLASTY       Current Outpatient Prescriptions  Medication Sig Dispense Refill  . allopurinol (ZYLOPRIM) 100 MG tablet Take ONE (1) tablet by mouth once daily 90 tablet 3  . apixaban (ELIQUIS) 5 MG TABS tablet Take 1 tablet (5 mg total) by mouth 2 (two) times daily. 60 tablet 6  . atorvastatin (LIPITOR) 10 MG tablet Take 1 tablet (10 mg total) by mouth daily. 30 tablet 0  . azelastine (ASTELIN) 0.1 % nasal spray Place 2 sprays into both nostrils 2 (two) times daily. Use in each nostril as directed 30 mL 3  . beclomethasone (QVAR) 40 MCG/ACT inhaler Inhale 2 puffs  into the lungs 2 (two) times daily. 1 Inhaler 3  . Blood Glucose Monitoring Suppl (GLUCOCOM BLOOD GLUCOSE MONITOR) DEVI Use to check blood sugars 6 times daily E11.65    . fluticasone (FLONASE) 50 MCG/ACT nasal spray Place 1 spray into both nostrils daily. 16 g 3  . Insulin Pen Needle (B-D ULTRAFINE III SHORT PEN) 31G X 8 MM MISC Use for insulin pen up to five times daily E11.65    . levothyroxine (SYNTHROID, LEVOTHROID) 175 MCG tablet Take 175 mcg by mouth daily before breakfast.    . metoprolol succinate (TOPROL-XL) 25 MG 24 hr tablet Take 1 tablet (25 mg total) by mouth daily. 90 tablet 3  . NOVOLOG 100 UNIT/ML injection Inject 40 Units into the skin 3 (three) times daily with meals.     . Prenatal Vit-Fe Fumarate-FA (MULTIVITAMIN-PRENATAL) 27-0.8 MG TABS tablet Take 1 tablet by mouth daily at 12 noon. Takes for nail and hair growth    . ramipril (ALTACE) 2.5 MG capsule Take ONE (1)  capsule by mouth once daily 90 capsule 0  . sertraline (ZOLOFT) 50 MG tablet Take ONE (1) tablet by mouth once daily 90 tablet 3  . spironolactone (ALDACTONE) 25 MG tablet Take ONE (1) tablet by mouth once daily 90 tablet 1  . torsemide (DEMADEX) 10 MG tablet Take 1 tablet (10 mg total) by mouth daily. 90 tablet 1   No current facility-administered medications for this visit.  Allergies:   Allopurinol; Indomethacin; Pregabalin; Sulfa antibiotics; and Sulfamethoxazole   Social History:  The patient  reports that she quit smoking about 17 years ago. She has never used smokeless tobacco. She reports that she does not drink alcohol or use drugs.   Family History:  The patient's ***family history includes Diabetes in her brother, father, and mother; Heart attack in her brother and father; Heart disease in her father; Hyperlipidemia in her brother, father, and mother.    ROS:  Please see the history of present illness.   Otherwise, review of systems is positive for ***.   All other systems are reviewed and  negative.    PHYSICAL EXAM: VS:  There were no vitals taken for this visit. , BMI There is no height or weight on file to calculate BMI. GEN: Well nourished, well developed, in no acute distress  HEENT: normal  Neck: no JVD, carotid bruits, or masses Cardiac: ***RRR; no murmurs, rubs, or gallops,no edema  Respiratory:  clear to auscultation bilaterally, normal work of breathing GI: soft, nontender, nondistended, + BS MS: no deformity or atrophy  Skin: warm and dry, *** device pocket is well healed Neuro:  Strength and sensation are intact Psych: euthymic mood, full affect  EKG:  EKG {ACTION; IS/IS QDI:[id number]} ordered today. Personal review of the ekg ordered shows ***  *** Device interrogation is reviewed today in detail.  See PaceArt for details.   Recent Labs: 04/10/2015: ALT 44; TSH 5.63 06/20/2015: BUN 24; Creatinine, Ser 0.77; Hemoglobin 13.2; Platelets 66; Potassium 3.6; Sodium 135    Lipid Panel     Component Value Date/Time   CHOL 184 04/10/2015 1040   TRIG 254.0 (H) 04/10/2015 1040   HDL 33.10 (L) 04/10/2015 1040   CHOLHDL 6 04/10/2015 1040   VLDL 50.8 (H) 04/10/2015 1040   LDLDIRECT 111.0 04/10/2015 1040     Wt Readings from Last 3 Encounters:  11/17/15 219 lb (99.3 kg)  09/25/15 215 lb 4 oz (97.6 kg)  08/19/15 211 lb (95.7 kg)      Other studies Reviewed: Additional studies/ records that were reviewed today include: TTE 11/26/15  Review of the above records today demonstrates:  - Left ventricle: The cavity size was normal. Wall thickness was   normal. Systolic function was normal. The estimated ejection   fraction was in the range of 50% to 55%. - Left atrium: The atrium was mildly dilated. - Right ventricle: The cavity size was mildly dilated. - Atrial septum: No defect or patent foramen ovale was identified. - Impressions: Overall poor image quality   ASSESSMENT AND PLAN:  1.  ***    Current medicines are reviewed at length with the patient  today.   The patient {ACTIONS; HAS/DOES NOT HAVE:19233} concerns regarding her medicines.  The following changes were made today:  {NONE DEFAULTED:18576::"none"}  Labs/ tests ordered today include: *** No orders of the defined types were placed in this encounter.    Disposition:   FU with *** {gen number [id number]} {Days to years:10300}  Signed, Braidan Ricciardi Meredith Leeds, MD  12/03/2015 2:11 PM     [facility] [address] [place] [address] [phone] (office) [phone] (fax)

## 2015-12-12 ENCOUNTER — Other Ambulatory Visit: Payer: Self-pay

## 2015-12-12 ENCOUNTER — Ambulatory Visit (INDEPENDENT_AMBULATORY_CARE_PROVIDER_SITE_OTHER): Payer: Commercial Managed Care - HMO | Admitting: Medical

## 2015-12-12 ENCOUNTER — Encounter: Payer: Self-pay | Admitting: Medical

## 2015-12-12 VITALS — BP 103/61 | HR 76 | Temp 97.7°F | Ht 62.0 in | Wt 224.2 lb

## 2015-12-12 DIAGNOSIS — B029 Zoster without complications: Secondary | ICD-10-CM

## 2015-12-12 DIAGNOSIS — J208 Acute bronchitis due to other specified organisms: Secondary | ICD-10-CM

## 2015-12-12 DIAGNOSIS — B9689 Other specified bacterial agents as the cause of diseases classified elsewhere: Secondary | ICD-10-CM

## 2015-12-12 MED ORDER — BECLOMETHASONE DIPROPIONATE 40 MCG/ACT IN AERS: 2.0000 | INHALATION_SPRAY | Freq: Two times a day (BID) | RESPIRATORY_TRACT | 3 refills | Status: DC

## 2015-12-12 MED ORDER — ALBUTEROL SULFATE HFA 108 (90 BASE) MCG/ACT IN AERS: 2.0000 | INHALATION_SPRAY | Freq: Four times a day (QID) | RESPIRATORY_TRACT | 2 refills | Status: DC | PRN

## 2015-12-12 MED ORDER — FAMCICLOVIR 500 MG PO TABS: 500.0000 mg | ORAL_TABLET | Freq: Three times a day (TID) | ORAL | 0 refills | Status: DC

## 2015-12-12 NOTE — Progress Notes (Signed)
Subjective:    Patient ID: Alexis [name], female    DOB: April 30, 1950, 65 y.o.   MRN: [id number]  HPI   Pt has left side neck small vesice out break on her left clavicle area and left side of her neck that started on Wednesday. Pt felt sharp burning on side of her neck before vesicles erupted on wed.  Pt still has pain . Area is very tender to touch.  Now has scabs and residual intermittent sharp pain in area.  No rash on side of her face. Pt has some mild left ear pain. No nasal congestion.   Review of Systems  Constitutional: Negative for chills, fatigue and fever.  Cardiovascular: Negative for chest pain and palpitations.  Skin: Positive for rash.  Hematological: Negative for adenopathy. Does not bruise/bleed easily.   Past Medical History:  Diagnosis Date  . Allergy   . Anxiety   . Arthritis   . Asthma   . Atrial fibrillation ([place])   . CHF (congestive heart failure) ([place])   . COPD (chronic obstructive pulmonary disease) ([place])   . Diabetes mellitus without complication ([place])   . Hyperlipidemia   . Hypertension   . Myocardial infarction   . Thyroid disease      Social History   Social History  . Marital status: Married    Spouse name: N/A  . Number of children: N/A  . Years of education: N/A   Occupational History  . Not on file.   Social History Main Topics  . Smoking status: Former Smoker    Quit date: 03/01/1998  . Smokeless tobacco: Never Used  . 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  . Drug use: No  . Sexual activity: No   Other Topics Concern  . Not on file   Social History Narrative  . No narrative on file    Past Surgical History:  Procedure Laterality Date  . CORONARY ARTERY BYPASS GRAFT    . TOTAL KNEE ARTHROPLASTY      Family History  Problem Relation Age of Onset  . Diabetes Mother   . Hyperlipidemia Mother   . Diabetes Father   . Hyperlipidemia Father   . Heart disease Father    before age 77  . Heart attack Father   . Diabetes Brother   . Hyperlipidemia Brother   . Heart attack Brother     Allergies  Allergen Reactions  . Allopurinol Anaphylaxis  . Indomethacin Other (See Comments)    Other reaction(s): Other (See Comments) Renal Insufficiency  . Pregabalin Other (See Comments)    Other reaction(s): DIZZINESS Other reaction(s): DIZZINESS  . Sulfa Antibiotics Hives  . Sulfamethoxazole Hives    Current Outpatient Prescriptions on File Prior to Visit  Medication Sig Dispense Refill  . allopurinol (ZYLOPRIM) 100 MG tablet Take ONE (1) tablet by mouth once daily 90 tablet 3  . apixaban (ELIQUIS) 5 MG TABS tablet Take 1 tablet (5 mg total) by mouth 2 (two) times daily. 60 tablet 6  . atorvastatin (LIPITOR) 10 MG tablet Take 1 tablet (10 mg total) by mouth daily. 30 tablet 0  . azelastine (ASTELIN) 0.1 % nasal spray Place 2 sprays into both nostrils 2 (two) times daily. Use in each nostril as directed 30 mL 3  . Blood Glucose Monitoring Suppl (GLUCOCOM BLOOD GLUCOSE MONITOR) DEVI Use to check blood sugars 6 times daily E11.65    . fluticasone (FLONASE) 50 MCG/ACT  nasal spray Place 1 spray into both nostrils daily. 16 g 3  . levothyroxine (SYNTHROID, LEVOTHROID) 175 MCG tablet Take 175 mcg by mouth daily before breakfast.    . metoprolol succinate (TOPROL-XL) 25 MG 24 hr tablet Take 1 tablet (25 mg total) by mouth daily. 90 tablet 3  . Prenatal Vit-Fe Fumarate-FA (MULTIVITAMIN-PRENATAL) 27-0.8 MG TABS tablet Take 1 tablet by mouth daily at 12 noon. Takes for nail and hair growth    . ramipril (ALTACE) 2.5 MG capsule Take ONE (1)  capsule by mouth once daily 90 capsule 0  . sertraline (ZOLOFT) 50 MG tablet Take ONE (1) tablet by mouth once daily 90 tablet 3  . spironolactone (ALDACTONE) 25 MG tablet Take ONE (1) tablet by mouth once daily 90 tablet 1  . torsemide (DEMADEX) 10 MG tablet Take 1 tablet (10 mg total) by mouth daily. 90 tablet 1  . Insulin Pen Needle  (B-D ULTRAFINE III SHORT PEN) 31G X 8 MM MISC Use for insulin pen up to five times daily E11.65    . NOVOLOG 100 UNIT/ML injection Inject 40 Units into the skin 3 (three) times daily with meals.      No current facility-administered medications on file prior to visit.     BP 103/61   Pulse 76   Temp 97.7 F (36.5 C) (Oral)   Ht 5' 2"  (1.575 m)   Wt 224 lb 3.2 oz (101.7 kg)   SpO2 98%   BMI 41.01 kg/m       Objective:   Physical Exam  General- No acute distress. Pleasant patient. Neck- Full range of motion, no jvd Lungs- Clear, even and unlabored. Heart- regular rate and rhythm. Neurologic- CNII- XII grossly intact.  Heent- normal. Left canal clear and tm normal.  Left side of neck- small scab. Over clavicle small scab as wel.(small vescile in both areas before scabs formed)      Assessment & Plan:  For your shingles will rx famvir antiviral med.  Pt will use only alleve for pain. She defers controlled meds since has hx of substance abuse(xanax).  Start treatment and notify us if more nerve type pain.  Educated on possible post herpetic neuralgia. Start famvir today.  Follow up 7-10 days or as needed  qvar and albuterol refill today. Pt states breathing controlled.

## 2015-12-12 NOTE — Progress Notes (Signed)
Pre visit review using our clinic tool,if applicable. No additional management support is needed unless otherwise documented below in the visit note.  

## 2015-12-12 NOTE — Patient Instructions (Addendum)
For your shingles will rx famvir antiviral med.  Pt will use only alleve for pain. She defers controlled meds since has hx of substance abuse(xanax).  Start treatment and notify us if more nerve type pain.  Educated on possible post herpetic neuralgia. Start famvir today.  Ear pain may be associated with local shingles infection. If pain gets worse please let us know and want to recheck.(Ear exam negative)  Follow up 7-10 days or as needed

## 2015-12-15 ENCOUNTER — Encounter: Payer: Self-pay | Admitting: Medical

## 2015-12-15 NOTE — Progress Notes (Unsigned)
Patients insurance will not cover Qvar. Note given to E.[place] for medication change. Waiting on response.

## 2015-12-16 ENCOUNTER — Telehealth: Payer: Self-pay | Admitting: Medical

## 2015-12-16 MED ORDER — FLUTICASONE PROPIONATE HFA 110 MCG/ACT IN AERO: 2.0000 | INHALATION_SPRAY | Freq: Two times a day (BID) | RESPIRATORY_TRACT | 3 refills | Status: DC

## 2015-12-16 NOTE — Telephone Encounter (Signed)
I sent in pt flovent inhaler in place of qvar. Insurance stated they would not fill qvar. Sheet states 90% chance will fill flovent. So advise pt to try flovent.

## 2015-12-17 NOTE — Telephone Encounter (Signed)
Called patient left message on answering machine regarding Flovent Inhaler.

## 2015-12-22 ENCOUNTER — Ambulatory Visit: Payer: Commercial Managed Care - HMO | Admitting: Medical

## 2015-12-24 ENCOUNTER — Ambulatory Visit (INDEPENDENT_AMBULATORY_CARE_PROVIDER_SITE_OTHER): Payer: Commercial Managed Care - HMO | Admitting: Medical

## 2015-12-24 VITALS — BP 122/62 | HR 91 | Temp 98.6°F | Resp 16 | Ht 62.0 in | Wt 224.2 lb

## 2015-12-24 DIAGNOSIS — I10 Essential (primary) hypertension: Secondary | ICD-10-CM | POA: Diagnosis not present

## 2015-12-24 DIAGNOSIS — E1043 Type 1 diabetes mellitus with diabetic autonomic (poly)neuropathy: Secondary | ICD-10-CM

## 2015-12-24 DIAGNOSIS — Z23 Encounter for immunization: Secondary | ICD-10-CM | POA: Diagnosis not present

## 2015-12-24 MED ORDER — RAMIPRIL 2.5 MG PO CAPS: ORAL_CAPSULE | ORAL | 1 refills | Status: DC

## 2015-12-24 NOTE — Patient Instructions (Addendum)
  For your diabetic neuropathy I want you to taper off the sertraline. Your prior anxiety and mood has improved. If you get off sertraline and are doing well(with no anxiety or depression) will rx cymbalta at that point(please update [place]). Want to minimize chance of serotonin syndrome.  For your bp will refill you ramipril. But check cmp to evaluate kidney function and your k level.  Flu vaccine given today.  Follow up in 2-3 months or as needed

## 2015-12-24 NOTE — Progress Notes (Signed)
Pre visit review using our clinic review tool, if applicable. No additional management support is needed unless otherwise documented below in the visit note. 

## 2015-12-24 NOTE — Progress Notes (Signed)
Subjective:    Patient ID: Alexis [name], female    DOB: [birth year], 65 y.o.   MRN: [id number]  HPI   Pt in for evaluation.   Pt in states she wants some medication for nerve pain in her feet. She is diabetic. She states in years past she did ok with low dose lyrica. But when she took higher dose she felt dizzy and loopy. She took lyrica in past for diabetic neuropathy. She can't remember dose she tried and can't remember pharmacy she used.  Pt states neurontin effected her thinking and she thought slurred speech.  States sharp shooting pain in lower ext at time in her legs.   She wants to try cymbalta. She is on sertrline  in the past. She used for anxiety and depression. She states over last year would run out of medication and states she did just fine without the medication. She states it helped mostly last year when her mom passed away.    Review of Systems  Constitutional: Negative for chills, fatigue and fever.  Respiratory: Negative for cough, chest tightness, shortness of breath and wheezing.   Cardiovascular: Negative for chest pain and palpitations.  Gastrointestinal: Negative for abdominal pain.  Musculoskeletal: Negative for back pain and neck stiffness.  Skin: Negative for rash.  Neurological: Negative for dizziness, speech difficulty, weakness and headaches.       Diabetic neuropathy type pain lower ext.  Hematological: Negative for adenopathy. Does not bruise/bleed easily.  Psychiatric/Behavioral: Negative for behavioral problems, confusion, dysphoric mood, sleep disturbance and suicidal ideas. The patient is not nervous/anxious.     Past Medical History:  Diagnosis Date  . Allergy   . Anxiety   . Arthritis   . Asthma   . Atrial fibrillation ([place])   . CHF (congestive heart failure) ([place])   . COPD (chronic obstructive pulmonary disease) ([place])   . Diabetes mellitus without complication ([place])   . Hyperlipidemia   . Hypertension   . Myocardial infarction     . Thyroid disease      Social History   Social History  . Marital status: Married    Spouse name: N/A  . Number of children: N/A  . Years of education: N/A   Occupational History  . Not on file.   Social History Main Topics  . Smoking status: Former Smoker    Quit date: 03/01/1998  . Smokeless tobacco: Never Used  . 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  . Drug use: No  . Sexual activity: No   Other Topics Concern  . Not on file   Social History Narrative  . No narrative on file    Past Surgical History:  Procedure Laterality Date  . CORONARY ARTERY BYPASS GRAFT    . TOTAL KNEE ARTHROPLASTY      Family History  Problem Relation Age of Onset  . Diabetes Mother   . Hyperlipidemia Mother   . Diabetes Father   . Hyperlipidemia Father   . Heart disease Father     before age 51  . Heart attack Father   . Diabetes Brother   . Hyperlipidemia Brother   . Heart attack Brother     Allergies  Allergen Reactions  . Allopurinol Anaphylaxis  . Indomethacin Other (See Comments)    Other reaction(s): Other (See Comments) Renal Insufficiency  . Pregabalin Other (See Comments)    Other reaction(s): DIZZINESS Other reaction(s): DIZZINESS  .  Sulfa Antibiotics Hives  . Sulfamethoxazole Hives    Current Outpatient Prescriptions on File Prior to Visit  Medication Sig Dispense Refill  . albuterol (PROVENTIL HFA;VENTOLIN HFA) 108 (90 Base) MCG/ACT inhaler Inhale 2 puffs into the lungs every 6 (six) hours as needed for wheezing or shortness of breath. 1 Inhaler 2  . allopurinol (ZYLOPRIM) 100 MG tablet Take ONE (1) tablet by mouth once daily 90 tablet 3  . apixaban (ELIQUIS) 5 MG TABS tablet Take 1 tablet (5 mg total) by mouth 2 (two) times daily. 60 tablet 6  . azelastine (ASTELIN) 0.1 % nasal spray Place 2 sprays into both nostrils 2 (two) times daily. Use in each nostril as directed 30 mL 3  . Blood Glucose Monitoring Suppl  (GLUCOCOM BLOOD GLUCOSE MONITOR) DEVI Use to check blood sugars 6 times daily E11.65    . famciclovir (FAMVIR) 500 MG tablet Take 1 tablet (500 mg total) by mouth 3 (three) times daily. 21 tablet 0  . fluticasone (FLONASE) 50 MCG/ACT nasal spray Place 1 spray into both nostrils daily. 16 g 3  . fluticasone (FLOVENT HFA) 110 MCG/ACT inhaler Inhale 2 puffs into the lungs 2 (two) times daily. 1 Inhaler 3  . levothyroxine (SYNTHROID, LEVOTHROID) 175 MCG tablet Take 175 mcg by mouth daily before breakfast.    . metoprolol succinate (TOPROL-XL) 25 MG 24 hr tablet Take 1 tablet (25 mg total) by mouth daily. 90 tablet 3  . NOVOLOG 100 UNIT/ML injection Inject 40 Units into the skin 3 (three) times daily with meals.     . Prenatal Vit-Fe Fumarate-FA (MULTIVITAMIN-PRENATAL) 27-0.8 MG TABS tablet Take 1 tablet by mouth daily at 12 noon. Takes for nail and hair growth    . ramipril (ALTACE) 2.5 MG capsule Take ONE (1)  capsule by mouth once daily 90 capsule 0  . sertraline (ZOLOFT) 50 MG tablet Take ONE (1) tablet by mouth once daily 90 tablet 3  . spironolactone (ALDACTONE) 25 MG tablet Take ONE (1) tablet by mouth once daily 90 tablet 1  . torsemide (DEMADEX) 10 MG tablet Take 1 tablet (10 mg total) by mouth daily. 90 tablet 1  . atorvastatin (LIPITOR) 10 MG tablet Take 1 tablet (10 mg total) by mouth daily. (Patient not taking: Reported on 12/24/2015) 30 tablet 0  . Insulin Pen Needle (B-D ULTRAFINE III SHORT PEN) 31G X 8 MM MISC Use for insulin pen up to five times daily E11.65     No current facility-administered medications on file prior to visit.     BP 122/62 (BP Location: Left Arm, Patient Position: Sitting, Cuff Size: Large)   Pulse 91   Temp 98.6 F (37 C)   Resp 16   Ht 5' 2"  (1.575 m)   Wt 224 lb 3.2 oz (101.7 kg)   SpO2 94%   BMI 41.01 kg/m      Objective:   Physical Exam  General- No acute distress. Pleasant patient. Neck- Full range of motion, no jvd Lungs- Clear, even and  unlabored. Heart- regular rate and rhythm. Neurologic- CNII- XII grossly intact. Lower ext- no pain on palpation. Neg homans signs.       Assessment & Plan:  For your diabetic neuropathy I want you to taper off the sertraline. Your prior anxiety and mood has improved. If you get off sertraline and are doing well(with no anxiety or depression) will rx cymbalta at that point(please update [place]). Want to minimize chance of serotonin syndrome.  For your bp will refill  you ramipril. But check cmp to evaluate kidney function and your k level.  Flu vaccine given today.  Follow up in 2-3 months or as needed

## 2015-12-25 LAB — COMPREHENSIVE METABOLIC PANEL
ALBUMIN: 4.2 g/dL (ref 3.5–5.2)
ALK PHOS: 109 U/L (ref 39–117)
ALT: 38 U/L — AB (ref 0–35)
AST: 43 U/L — AB (ref 0–37)
BILIRUBIN TOTAL: 0.7 mg/dL (ref 0.2–1.2)
BUN: 38 mg/dL — AB (ref 6–23)
CO2: 29 mEq/L (ref 19–32)
CREATININE: 0.88 mg/dL (ref 0.40–1.20)
Calcium: 10.2 mg/dL (ref 8.4–10.5)
Chloride: 103 mEq/L (ref 96–112)
GFR: 68.15 mL/min (ref >60.00)
Glucose, Bld: 211 mg/dL — ABNORMAL HIGH (ref 70–99)
Potassium: 4.9 mEq/L (ref 3.5–5.1)
Sodium: 140 mEq/L (ref 135–145)
TOTAL PROTEIN: 8 g/dL (ref 6.0–8.3)

## 2016-01-05 ENCOUNTER — Telehealth: Payer: Self-pay | Admitting: Medical

## 2016-01-05 MED ORDER — DULOXETINE HCL 30 MG PO CPEP: 30.0000 mg | ORAL_CAPSULE | Freq: Two times a day (BID) | ORAL | 0 refills | Status: DC

## 2016-01-05 NOTE — Telephone Encounter (Signed)
Rx cymbalta sent to her pharmacy.

## 2016-01-05 NOTE — Telephone Encounter (Signed)
Relation to EN:MM[facility] Call back [place]: [place], [place], [address] [phone] (Phone) [phone] (Fax)     Reason for call:  Patient was advised to inform PCP she's off the sertraline (ZOLOFT) 50 MG tablet and requesting cymbalta

## 2016-01-06 ENCOUNTER — Telehealth: Payer: Self-pay | Admitting: Medical

## 2016-01-06 NOTE — Telephone Encounter (Signed)
Please advise 

## 2016-01-06 NOTE — Telephone Encounter (Signed)
°  Caller name: Relationship to patient: Self Can be reached: [phone] Pharmacy:  Reason for call: Patient called stating that she is having withdrawal symptoms from stopping the Zoloft and wants to know if she should wait or start taking cymbalta now. Plse adv

## 2016-01-06 NOTE — Telephone Encounter (Signed)
Cymbalta effects 2 neuro tramsmitters. One which is like zoloft. So have her start cymbalta. Follow up in this coming Monday or Tuesday.   But also when pt states withdrawal symptoms would you clarify/ask her exactly what she is feeling. Let me know.  If having severe symptoms maybe she needs to be seen in office by Doc of the day  or in ED.   She also tapered off sertraline. So she should not have had significant symptoms so please clarify with her how she feels?

## 2016-01-07 NOTE — Telephone Encounter (Signed)
I left a message for the patient to call back. She will need a follow up with PCP next week.

## 2016-01-08 NOTE — Telephone Encounter (Signed)
I spoke with the patient and she states that she has stopped the Zoloft. She states that she will follow up with her PCP if her symptoms become worse. Pt states that she was having some blood in her ear and she was offered an appointment and she declined at this time. I advised her that if her ear was hurting or she noticed any bleeding tonight that she would need to go to the ER. She was advised that if her symptoms were worse tomorrow that she would need to be evaluated. She voices understanding.

## 2016-01-14 ENCOUNTER — Ambulatory Visit (INDEPENDENT_AMBULATORY_CARE_PROVIDER_SITE_OTHER): Payer: Commercial Managed Care - HMO | Admitting: Medical

## 2016-01-14 ENCOUNTER — Encounter: Payer: Self-pay | Admitting: Medical

## 2016-01-14 VITALS — BP 124/68 | HR 89 | Temp 98.1°F | Ht 62.0 in | Wt 225.6 lb

## 2016-01-14 DIAGNOSIS — M545 Low back pain: Secondary | ICD-10-CM | POA: Diagnosis not present

## 2016-01-14 DIAGNOSIS — L989 Disorder of the skin and subcutaneous tissue, unspecified: Secondary | ICD-10-CM

## 2016-01-14 DIAGNOSIS — H61899 Other specified disorders of external ear, unspecified ear: Secondary | ICD-10-CM

## 2016-01-14 MED ORDER — CYCLOBENZAPRINE HCL 5 MG PO TABS: ORAL_TABLET | ORAL | 0 refills | Status: DC

## 2016-01-14 NOTE — Patient Instructions (Addendum)
Your back pain appears to be pulled muscle and sciatica. I want you to continue rest, warm compresses, alleve and can alternate tylenol.  Low dose flexeril to use at night  If pain persists or worsen then may get xray and refer to sports medicine.  For your ear canal lesion will refer you to specialist.  Follow up in 10 days or as needed

## 2016-01-14 NOTE — Progress Notes (Signed)
Pre visit review using our clinic review tool, if applicable. No additional management support is needed unless otherwise documented below in the visit note. 

## 2016-01-14 NOTE — Progress Notes (Signed)
Subjective:    Patient ID: Alexis [name], female    DOB: 1950-09-28, 65 y.o.   MRN: [id number]  HPI   Pt in for pain in her back. She states that on Sunday night was on toilet. She twisted and felt acute onset of pain. Pt has been taking alleve and helps pain. But she states some mild sedation with alleve.  Pt states without med pain 8/10. With alleve pain 4/10 with alleve. Pt states warm compress helps as well.   No mid lumbar pain. No radicular pain recently.   Also rt ear mild bleeding that was present on Friday of last week. Then stopped. No ear pain. No nasal congestion.   Review of Systems  Constitutional: Negative for chills, fatigue and fever.  HENT: Negative for congestion, ear pain, hearing loss, postnasal drip, sinus pain, sinus pressure, sneezing and sore throat.        Last Friday she had brief bleeding from her ear.  Respiratory: Negative for cough, chest tightness, shortness of breath and wheezing.   Cardiovascular: Negative for chest pain and palpitations.  Gastrointestinal: Negative for abdominal pain.  Genitourinary: Negative for dyspareunia, flank pain, hematuria, pelvic pain, urgency and vaginal pain.  Musculoskeletal: Positive for back pain.  Neurological: Negative for dizziness and numbness.  Hematological: Negative for adenopathy. Does not bruise/bleed easily.  Psychiatric/Behavioral: Negative for behavioral problems and confusion.    Past Medical History:  Diagnosis Date  . Allergy   . Anxiety   . Arthritis   . Asthma   . Atrial fibrillation ([place])   . CHF (congestive heart failure) ([place])   . COPD (chronic obstructive pulmonary disease) ([place])   . Diabetes mellitus without complication ([place])   . Hyperlipidemia   . Hypertension   . Myocardial infarction   . Thyroid disease      Social History   Social History  . Marital status: Married    Spouse name: N/A  . Number of children: N/A  . Years of education: N/A   Occupational History  .  Not on file.   Social History Main Topics  . Smoking status: Former Smoker    Quit date: 03/01/1998  . Smokeless tobacco: Never Used  . 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  . Drug use: No  . Sexual activity: No   Other Topics Concern  . Not on file   Social History Narrative  . No narrative on file    Past Surgical History:  Procedure Laterality Date  . CORONARY ARTERY BYPASS GRAFT    . TOTAL KNEE ARTHROPLASTY      Family History  Problem Relation Age of Onset  . Diabetes Mother   . Hyperlipidemia Mother   . Diabetes Father   . Hyperlipidemia Father   . Heart disease Father     before age 20  . Heart attack Father   . Diabetes Brother   . Hyperlipidemia Brother   . Heart attack Brother     Allergies  Allergen Reactions  . Allopurinol Anaphylaxis  . Indomethacin Other (See Comments)    Other reaction(s): Other (See Comments) Renal Insufficiency  . Pregabalin Other (See Comments)    Other reaction(s): DIZZINESS Other reaction(s): DIZZINESS  . Sulfa Antibiotics Hives  . Sulfamethoxazole Hives    Current Outpatient Prescriptions on File Prior to Visit  Medication Sig Dispense Refill  . albuterol (PROVENTIL HFA;VENTOLIN HFA) 108 (90 Base) MCG/ACT inhaler Inhale 2 puffs  into the lungs every 6 (six) hours as needed for wheezing or shortness of breath. 1 Inhaler 2  . allopurinol (ZYLOPRIM) 100 MG tablet Take ONE (1) tablet by mouth once daily 90 tablet 3  . apixaban (ELIQUIS) 5 MG TABS tablet Take 1 tablet (5 mg total) by mouth 2 (two) times daily. 60 tablet 6  . atorvastatin (LIPITOR) 10 MG tablet Take 1 tablet (10 mg total) by mouth daily. 30 tablet 0  . azelastine (ASTELIN) 0.1 % nasal spray Place 2 sprays into both nostrils 2 (two) times daily. Use in each nostril as directed 30 mL 3  . Blood Glucose Monitoring Suppl (GLUCOCOM BLOOD GLUCOSE MONITOR) DEVI Use to check blood sugars 6 times daily E11.65    . DULoxetine  (CYMBALTA) 30 MG capsule Take 1 capsule (30 mg total) by mouth 2 (two) times daily. 60 capsule 0  . famciclovir (FAMVIR) 500 MG tablet Take 1 tablet (500 mg total) by mouth 3 (three) times daily. 21 tablet 0  . fluticasone (FLONASE) 50 MCG/ACT nasal spray Place 1 spray into both nostrils daily. 16 g 3  . fluticasone (FLOVENT HFA) 110 MCG/ACT inhaler Inhale 2 puffs into the lungs 2 (two) times daily. 1 Inhaler 3  . Insulin Disposable Pump (V-GO 40) KIT Inject 40 mg as directed 6 (six) times daily.    . Insulin Pen Needle (B-D ULTRAFINE III SHORT PEN) 31G X 8 MM MISC Use for insulin pen up to five times daily E11.65    . levothyroxine (SYNTHROID, LEVOTHROID) 175 MCG tablet Take 175 mcg by mouth daily before breakfast.    . metoprolol succinate (TOPROL-XL) 25 MG 24 hr tablet Take 1 tablet (25 mg total) by mouth daily. 90 tablet 3  . NOVOLOG 100 UNIT/ML injection Inject 40 Units into the skin 3 (three) times daily with meals.     . Prenatal Vit-Fe Fumarate-FA (MULTIVITAMIN-PRENATAL) 27-0.8 MG TABS tablet Take 1 tablet by mouth daily at 12 noon. Takes for nail and hair growth    . ramipril (ALTACE) 2.5 MG capsule Take ONE (1)  capsule by mouth once daily 90 capsule 1  . spironolactone (ALDACTONE) 25 MG tablet Take ONE (1) tablet by mouth once daily 90 tablet 1  . torsemide (DEMADEX) 10 MG tablet Take 1 tablet (10 mg total) by mouth daily. 90 tablet 1   No current facility-administered medications on file prior to visit.     BP 124/68 (BP Location: Left Arm, Patient Position: Sitting)   Pulse 89   Temp 98.1 F (36.7 C) (Oral)   Ht _0  (1.575 m)   Wt 225 lb 9.6 oz (102.3 kg)   SpO2 98%   BMI 41.26 kg/m       Objective:   Physical Exam  General  Mental Status - Alert. General Appearance - Well groomed. Not in acute distress.  Skin Rashes- No Rashes.  HEENT Head- Normal. Ear Auditory Canal - Left- Normal. Right -no wax present. But mid portion of canal has dark hyperpigmented  lesion. Tried to remove with lesion but could not remove. Did not appear to be scab or wax. Tympanic Membrane- Left- Normal. Right- Normal. Eye Sclera/Conjunctiva- Left- Normal. Right- Normal. Nose & Sinuses Nasal Mucosa- Left-  Boggy and Congested. Right-  Boggy and  Congested.Bilateral maxillary and frontal sinus pressure.    Chest and Lung Exam Auscultation: Breath sounds:-Normal. Clear even and unlabored. Adventitious sounds:- No Adventitious sounds.  Cardiovascular Auscultation:Rythm - Regular, rate and rythm. Heart Sounds -Normal heart sounds.  Abdomen Inspection:-Inspection Normal.  Palpation/Perucssion: Palpation and Percussion of the abdomen reveal- Non Tender, No Rebound tenderness, No rigidity(Guarding) and No Palpable abdominal masses.  Liver:-Normal.  Spleen:- Normal.   Back No miid lumbar spine tenderness to palpation But rt si tenderness to direct palpation. Pain on lateral movements and flexion/extension of the spine.  Lower ext neurologic  L5-S1 sensation intact bilaterally. No foot drop bilaterally.       Assessment & Plan:  Your back pain appears to be pulled muscle and sciatica. I want you to continue rest, warm compresses, alleve and can alternate tylenol.  Low dose flexeril to use at night  If pain persists or worsen then may get xray and refer to sports medicine.  For your ear canal lesion will refer you to specialist.  Follow up in 10 days or as needed

## 2016-01-27 ENCOUNTER — Emergency Department (HOSPITAL_BASED_OUTPATIENT_CLINIC_OR_DEPARTMENT_OTHER): Payer: Commercial Managed Care - HMO

## 2016-01-27 ENCOUNTER — Encounter (HOSPITAL_BASED_OUTPATIENT_CLINIC_OR_DEPARTMENT_OTHER): Payer: Self-pay

## 2016-01-27 ENCOUNTER — Emergency Department (HOSPITAL_BASED_OUTPATIENT_CLINIC_OR_DEPARTMENT_OTHER)
Admission: EM | Admit: 2016-01-27 | Discharge: 2016-01-27 | Disposition: A | Discharge status: Home / Self Care | Payer: Commercial Managed Care - HMO | Attending: Emergency Medicine | Admitting: Emergency Medicine

## 2016-01-27 ENCOUNTER — Ambulatory Visit: Payer: Commercial Managed Care - HMO | Admitting: Medical

## 2016-01-27 ENCOUNTER — Telehealth: Payer: Self-pay | Admitting: Medical

## 2016-01-27 DIAGNOSIS — R0602 Shortness of breath: Secondary | ICD-10-CM | POA: Diagnosis not present

## 2016-01-27 DIAGNOSIS — I110 Hypertensive heart disease with heart failure: Secondary | ICD-10-CM | POA: Diagnosis not present

## 2016-01-27 DIAGNOSIS — Z794 Long term (current) use of insulin: Secondary | ICD-10-CM | POA: Insufficient documentation

## 2016-01-27 DIAGNOSIS — Z79899 Other long term (current) drug therapy: Secondary | ICD-10-CM | POA: Diagnosis not present

## 2016-01-27 DIAGNOSIS — J45909 Unspecified asthma, uncomplicated: Secondary | ICD-10-CM | POA: Diagnosis not present

## 2016-01-27 DIAGNOSIS — M79604 Pain in right leg: Secondary | ICD-10-CM | POA: Diagnosis not present

## 2016-01-27 DIAGNOSIS — R0600 Dyspnea, unspecified: Secondary | ICD-10-CM | POA: Insufficient documentation

## 2016-01-27 DIAGNOSIS — I509 Heart failure, unspecified: Secondary | ICD-10-CM | POA: Insufficient documentation

## 2016-01-27 DIAGNOSIS — I2510 Atherosclerotic heart disease of native coronary artery without angina pectoris: Secondary | ICD-10-CM | POA: Insufficient documentation

## 2016-01-27 DIAGNOSIS — Z87891 Personal history of nicotine dependence: Secondary | ICD-10-CM | POA: Diagnosis not present

## 2016-01-27 DIAGNOSIS — E119 Type 2 diabetes mellitus without complications: Secondary | ICD-10-CM | POA: Insufficient documentation

## 2016-01-27 DIAGNOSIS — M79605 Pain in left leg: Secondary | ICD-10-CM | POA: Diagnosis not present

## 2016-01-27 DIAGNOSIS — I252 Old myocardial infarction: Secondary | ICD-10-CM | POA: Insufficient documentation

## 2016-01-27 DIAGNOSIS — J449 Chronic obstructive pulmonary disease, unspecified: Secondary | ICD-10-CM | POA: Diagnosis not present

## 2016-01-27 DIAGNOSIS — E039 Hypothyroidism, unspecified: Secondary | ICD-10-CM | POA: Insufficient documentation

## 2016-01-27 DIAGNOSIS — E875 Hyperkalemia: Secondary | ICD-10-CM

## 2016-01-27 DIAGNOSIS — I10 Essential (primary) hypertension: Secondary | ICD-10-CM | POA: Diagnosis not present

## 2016-01-27 HISTORY — DX: Polyneuropathy, unspecified: G62.9

## 2016-01-27 LAB — COMPREHENSIVE METABOLIC PANEL
ALBUMIN: 4.1 g/dL (ref 3.5–5.0)
ALT: 55 U/L — ABNORMAL HIGH (ref 14–54)
ANION GAP: 11 (ref 5–15)
AST: 64 U/L — ABNORMAL HIGH (ref 15–41)
Alkaline Phosphatase: 123 U/L (ref 38–126)
BUN: 30 mg/dL — ABNORMAL HIGH (ref 6–20)
CO2: 26 mmol/L (ref 22–32)
Calcium: 10.1 mg/dL (ref 8.9–10.3)
Chloride: 97 mmol/L — ABNORMAL LOW (ref 101–111)
Creatinine, Ser: 0.91 mg/dL (ref 0.44–1.00)
GFR calc Af Amer: >60 mL/min (ref >60)
GFR calc non Af Amer: >60 mL/min (ref >60)
GLUCOSE: 224 mg/dL — AB (ref 65–99)
POTASSIUM: 4.2 mmol/L (ref 3.5–5.1)
SODIUM: 134 mmol/L — AB (ref 135–145)
Total Bilirubin: 0.6 mg/dL (ref 0.3–1.2)
Total Protein: 7.9 g/dL (ref 6.5–8.1)

## 2016-01-27 LAB — CBC WITH DIFFERENTIAL/PLATELET
BASOS PCT: 0 %
Basophils Absolute: 0 10*3/uL (ref 0.0–0.1)
EOS ABS: 0.1 10*3/uL (ref 0.0–0.7)
Eosinophils Relative: 1 %
HCT: 41.5 % (ref 36.0–46.0)
HEMOGLOBIN: 14.5 g/dL (ref 12.0–15.0)
Lymphocytes Relative: 23 %
Lymphs Abs: 1.4 10*3/uL (ref 0.7–4.0)
MCH: 33.3 pg (ref 26.0–34.0)
MCHC: 34.9 g/dL (ref 30.0–36.0)
MCV: 95.4 fL (ref 78.0–100.0)
MONO ABS: 0.5 10*3/uL (ref 0.1–1.0)
MONOS PCT: 8 %
NEUTROS PCT: 68 %
Neutro Abs: 4.2 10*3/uL (ref 1.7–7.7)
Platelets: 73 10*3/uL — ABNORMAL LOW (ref 150–400)
RBC: 4.35 MIL/uL (ref 3.87–5.11)
RDW: 13.7 % (ref 11.5–15.5)
WBC: 6.1 10*3/uL (ref 4.0–10.5)

## 2016-01-27 LAB — BRAIN NATRIURETIC PEPTIDE: B Natriuretic Peptide: 40.1 pg/mL (ref 0.0–100.0)

## 2016-01-27 LAB — CK: Total CK: 62 U/L (ref 38–234)

## 2016-01-27 NOTE — ED Provider Notes (Signed)
Emergency Department Provider Note  I have reviewed the triage vital signs and the nursing notes.  By signing my name below, I, Georgette Shell, attest that this documentation has been prepared under the direction and in the presence of Margette Fast, MD. Electronically Signed: Georgette Shell, ED Scribe. 01/27/16. 9:00 PM.  HISTORY  Chief Complaint Leg Pain  HPI Comments: Alexis [name] is a 65 y.o. female with h/o A-fib, CHF, COPD, DM, HTN, MI, and neuropathy who presents to the Emergency Department complaining of cramping, burning, bilateral leg pain onset 8 days ago. She reports she's also had worsened trouble breathing the past couple of days since the onset of her symptoms. Pain is exacerbated with movement and ambulating. Pt has not tried any OTC medications PTA. Pt has h/o neuropathy and notes that this does not feel similar. She states she has had this pain prior and associates it with her potassium levels. Pt is on Eloquis. Pt denies fever, chills, or any other associated symptoms.    Past Medical History:  Diagnosis Date  . Allergy   . Anxiety   . Arthritis   . Asthma   . Atrial fibrillation ([place])   . CHF (congestive heart failure) ([place])   . COPD (chronic obstructive pulmonary disease) ([place])   . Diabetes mellitus without complication ([place])   . Hyperlipidemia   . Hypertension   . Myocardial infarction   . Peripheral neuropathy ([place])   . Thyroid disease     Patient Active Problem List   Diagnosis Date Noted  . Hyperlipidemia 11/18/2015  . Gout 08/05/2014  . Cough 08/05/2014  . Diabetes ([place]) 07/12/2014  . Allergic rhinitis 07/12/2014  . Osteoarthritis 07/12/2014  . Asthma 07/12/2014  . CHF (congestive heart failure) ([place]) 07/12/2014  . CAD (coronary artery disease) 07/12/2014  . Atrial fibrillation ([place]) 07/12/2014  . HTN (hypertension) 07/12/2014  . Hypothyroidism 07/12/2014  . History of substance abuse 07/12/2014  . Peripheral vascular disease, unspecified  04/02/2013  . Discoloration of skin 04/02/2013    Past Surgical History:  Procedure Laterality Date  . CORONARY ARTERY BYPASS GRAFT    . TOTAL KNEE ARTHROPLASTY      Current Outpatient Rx  . Order #: [id number] Class: Normal  . Order #: [id number] Class: Normal  . Order #: [id number] Class: Normal  . Order #: [id number] Class: Normal  . Order #: [id number] Class: Normal  . Order #: [id number] Class: Historical Med  . Order #: [id number] Class: Normal  . Order #: [id number] Class: Normal  . Order #: [id number] Class: Normal  . Order #: [id number] Class: Normal  . Order #: [id number] Class: Normal  . Order #: [id number] Class: Historical Med  . Order #: [id number] Class: Historical Med  . Order #: [id number] Class: Historical Med  . Order #: [id number] Class: Normal  . Order #: [id number] Class: Historical Med  . Order #: [id number] Class: Historical Med  . Order #: [id number] Class: Normal  . Order #: [id number] Class: Normal  . Order #: [id number] Class: Normal    Allergies Allopurinol; Indomethacin; Pregabalin; Sulfa antibiotics; and Sulfamethoxazole  Family History  Problem Relation Age of Onset  . Diabetes Mother   . Hyperlipidemia Mother   . Diabetes Father   . Hyperlipidemia Father   . Heart disease Father     before age 41  . Heart attack Father   . Diabetes Brother   . Hyperlipidemia Brother   . Heart attack Brother     Social History Social History  Substance Use Topics  . Smoking status: Former Smoker    Quit date: 03/01/1998  .  Smokeless tobacco: Never Used  . 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    Review of Systems  Constitutional: No fever/chills Eyes: No visual changes. ENT: No sore throat. Cardiovascular: Denies chest pain. Respiratory: Denies shortness of breath. Gastrointestinal: No abdominal pain.  No nausea, no vomiting.  No diarrhea.  No constipation. Genitourinary: Negative for dysuria. Musculoskeletal: Negative for back pain.  Leg pain. Skin: Negative for rash. Neurological: Negative for headaches, focal weakness or numbness. 10-point ROS otherwise negative.  ____________________________________________   PHYSICAL EXAM:  VITAL SIGNS: ED Triage Vitals  Enc Vitals Group     BP 01/27/16 1958 106/63     Pulse Rate 01/27/16 1958 85     Resp 01/27/16 1958 20     Temp 01/27/16 1958 98 F (36.7 C)     Temp Source 01/27/16 1958 Oral     SpO2 01/27/16 1958 96 %     Weight 01/27/16 1959 228 lb (103.4 kg)     Height 01/27/16 1959 5' 2"  (1.575 m)   Constitutional: Alert and oriented. Well appearing and in no acute distress. Eyes: Conjunctivae are normal.  Head: Atraumatic. Nose: No congestion/rhinnorhea. Mouth/Throat: Mucous membranes are moist.  Neck: No stridor.   Cardiovascular: Normal rate, regular rhythm. Good peripheral circulation. Grossly normal heart sounds.   Respiratory: Normal respiratory effort.  No retractions. Lungs CTAB. Gastrointestinal: Soft and nontender. No distention.  Musculoskeletal: No lower extremity tenderness nor edema. No gross deformities of extremities. Neurologic:  Normal speech and language. No gross focal neurologic deficits are appreciated.  Skin:  Skin is warm, dry and intact. No rash noted.   ____________________________________________   LABS (all labs ordered are listed, but only abnormal results are displayed)  Labs Reviewed  CBC WITH DIFFERENTIAL/PLATELET - Abnormal; Notable for the following:       Result Value   Platelets 73 (*)    All other components within normal limits  COMPREHENSIVE METABOLIC PANEL - Abnormal; Notable for the following:    Sodium 134 (*)    Chloride 97 (*)    Glucose, Bld 224 (*)    BUN 30 (*)    AST 64 (*)    ALT 55 (*)    All other components within normal limits  BRAIN NATRIURETIC PEPTIDE  CK   ____________________________________________  EKG   EKG Interpretation  Date/Time:  Tuesday January 27 2016 21:03:39  EST Ventricular Rate:  120 PR Interval:    QRS Duration: 86 QT Interval:  292 QTC Calculation: 413 R Axis:   72 Text Interpretation:  Atrial fibrillation Nonspecific repol abnormality, inferior leads No STEMI.  SImilar to prior.  Confirmed by Jahnessa Vanduyn MD, Anyi Fels [phone]) on 01/28/2016 9:55:21 AM       ____________________________________________  RADIOLOGY  Dg Chest 2 View  Result Date: 01/27/2016 CLINICAL DATA:  65 year old female with cramping, bilateral leg pain, shortness of Breath. Initial encounter. EXAM: CHEST  2 VIEW COMPARISON:  06/20/2015 and earlier. FINDINGS: Stable lung volumes. Stable mild to moderate cardiomegaly. Other mediastinal contours are within normal limits. Stable left chest cardiac pacemaker. Visualized tracheal air column is within normal limits. No pneumothorax, pulmonary edema, pleural effusion or acute pulmonary opacity. Exaggerated thoracic kyphosis. No acute osseous abnormality identified. Calcified aortic atherosclerosis. IMPRESSION: 1.  No acute cardiopulmonary abnormality. 2.  Calcified aortic atherosclerosis. Electronically Signed   By: Genevie Ann M.D.   On: 01/27/2016 21:32    ____________________________________________   PROCEDURES  Procedure(s) performed:  Procedures  None ____________________________________________   INITIAL IMPRESSION / ASSESSMENT AND PLAN / ED COURSE  Pertinent labs & imaging results that were available during my care of the patient were reviewed by me and considered in my medical decision making (see chart for details).  Patient presents to the ED for evaluation of B/L lower extremity edema and mild acute on chronic dyspnea. No clinical evidence on exam to suspect fluid overload. Patient has history of neuropathy with recent medication change to Celexa. Plan for labs, CXR with dyspnea, EKG, CK, and BNP.   Labs, CXR, and EKG are unremarkable. Plan for discharge with PCP follow up and continued outpatient w/u  At this  time, I do not feel there is any life-threatening condition present. I have reviewed and discussed all results (EKG, imaging, lab, urine as appropriate), exam findings with patient. I have reviewed nursing notes and appropriate previous records.  I feel the patient is safe to be discharged home without further emergent workup. Discussed usual and customary return precautions. Patient and family (if present) verbalize understanding and are comfortable with this plan.  Patient will follow-up with their primary care provider. If they do not have a primary care provider, information for follow-up has been provided to them. All questions have been answered.    ____________________________________________  FINAL CLINICAL IMPRESSION(S) / ED DIAGNOSES  Final diagnoses:  Bilateral leg pain     MEDICATIONS GIVEN DURING THIS VISIT:  None  NEW OUTPATIENT MEDICATIONS STARTED DURING THIS VISIT:  None  I personally performed the services described in this documentation, which was scribed in my presence. The recorded information has been reviewed and is accurate.    Note:  This document was prepared using Dragon voice recognition software and may include unintentional dictation errors.  Nanda Quinton, MD Emergency Medicine    Margette Fast, MD 01/28/16 [phone]

## 2016-01-27 NOTE — ED Triage Notes (Addendum)
C/o bilat leg cramps, burning x 8 days-also c/o recent weight gain-was seen by PCP last week for back pain -was advised to let him know if leg pain cont'd-missed MD appt/called today and was advised to come to ED-NAD-steady gait

## 2016-01-27 NOTE — Telephone Encounter (Signed)
[facility]  Patient Name: Alexis [name]  DOB: 07-28-1950    Initial Comment Caller gets cramps in legs, muscles are sore. She has been taking potassium and it's not helping, has switched from Zoloft, wondering if that's the cause.    Nurse Assessment  Nurse: Harlow Mares, RN, Suanne Marker Date/Time Eilene Ghazi Time): 01/27/2016 5:06:14 PM  Confirm and document reason for call. If symptomatic, describe symptoms. You must click the next button to save text entered. ---Caller gets cramps in legs, muscles are sore. She has been taking potassium (for 3 days) and it's not helping, has switched from Zoloft, wondering if that's the cause. Began last week. Has burning in her legs after ambulating for 5 mins or longer.  Does the patient have any new or worsening symptoms? ---Yes  Will a triage be completed? ---Yes  Related visit to physician within the last 2 weeks? ---Yes  Does the PT have any chronic conditions? (i.e. diabetes, asthma, etc.) ---Yes  List chronic conditions. ---diabetes; HTN; a-fib  Is this a behavioral health or substance abuse call? ---No     Guidelines    Guideline Title Affirmed Question Affirmed Notes  Leg Pain Difficulty breathing    Final Disposition User   Go to ED Now Harlow Mares, RN, Rhonda    Referrals  [facility] - ED   Disagree/Comply: Comply

## 2016-01-27 NOTE — ED Notes (Signed)
Patient transported to X-ray 

## 2016-01-27 NOTE — Discharge Instructions (Signed)

## 2016-01-28 ENCOUNTER — Telehealth: Payer: Self-pay | Admitting: Medical

## 2016-01-28 NOTE — Telephone Encounter (Signed)
Called patient and left message to return call

## 2016-01-28 NOTE — Telephone Encounter (Signed)
Called pt, she endorses muscle spasms in legs. 'My bones are sore just to touch them. When I walk my muscles feel like they're burning.' States symptoms started 6-7 days after starting Cymbalta. Symptoms unchanged from ED eval yesterday. ED follow-up scheduled w/ PCP tomorrow.

## 2016-01-28 NOTE — Telephone Encounter (Signed)
Relation to ZG:QHQI Call back number:[phone]   Reason for call:  Patient was seen in the ED last night 01/27/16 and was advise to phone in her PCP with an update, offered appointment patient declined and requested to speak with nurse directly, please advise

## 2016-01-29 ENCOUNTER — Ambulatory Visit (INDEPENDENT_AMBULATORY_CARE_PROVIDER_SITE_OTHER): Payer: Commercial Managed Care - HMO | Admitting: Medical

## 2016-01-29 ENCOUNTER — Encounter: Payer: Self-pay | Admitting: Medical

## 2016-01-29 VITALS — BP 122/70 | HR 45 | Temp 98.1°F | Ht 62.0 in | Wt 226.0 lb

## 2016-01-29 DIAGNOSIS — R252 Cramp and spasm: Secondary | ICD-10-CM

## 2016-01-29 DIAGNOSIS — M791 Myalgia, unspecified site: Secondary | ICD-10-CM

## 2016-01-29 DIAGNOSIS — D696 Thrombocytopenia, unspecified: Secondary | ICD-10-CM

## 2016-01-29 DIAGNOSIS — G629 Polyneuropathy, unspecified: Secondary | ICD-10-CM

## 2016-01-29 LAB — CBC WITH DIFFERENTIAL/PLATELET
BASOS ABS: 0 10*3/uL (ref 0.0–0.1)
BASOS PCT: 0.3 % (ref 0.0–3.0)
EOS ABS: 0.1 10*3/uL (ref 0.0–0.7)
Eosinophils Relative: 1.3 % (ref 0.0–5.0)
HCT: 44.1 % (ref 36.0–46.0)
HEMOGLOBIN: 15 g/dL (ref 12.0–15.0)
LYMPHS PCT: 21.2 % (ref 12.0–46.0)
Lymphs Abs: 1.6 10*3/uL (ref 0.7–4.0)
MCHC: 34 g/dL (ref 30.0–36.0)
MCV: 96.7 fl (ref 78.0–100.0)
MONO ABS: 0.4 10*3/uL (ref 0.1–1.0)
Monocytes Relative: 5.6 % (ref 3.0–12.0)
NEUTROS ABS: 5.4 10*3/uL (ref 1.4–7.7)
Neutrophils Relative %: 71.6 % (ref 43.0–77.0)
PLATELETS: 96 10*3/uL — AB (ref 150.0–400.0)
RBC: 4.56 Mil/uL (ref 3.87–5.11)
RDW: 14.6 % (ref 11.5–15.5)
WBC: 7.6 10*3/uL (ref 4.0–10.5)

## 2016-01-29 LAB — COMPREHENSIVE METABOLIC PANEL
ALK PHOS: 131 U/L — AB (ref 39–117)
ALT: 56 U/L — AB (ref 0–35)
AST: 67 U/L — AB (ref 0–37)
Albumin: 4.3 g/dL (ref 3.5–5.2)
BILIRUBIN TOTAL: 0.7 mg/dL (ref 0.2–1.2)
BUN: 31 mg/dL — ABNORMAL HIGH (ref 6–23)
CALCIUM: 10.3 mg/dL (ref 8.4–10.5)
CO2: 31 meq/L (ref 19–32)
CREATININE: 1.06 mg/dL (ref 0.40–1.20)
Chloride: 96 mEq/L (ref 96–112)
GFR: 54.96 mL/min — AB (ref >60.00)
Glucose, Bld: 225 mg/dL — ABNORMAL HIGH (ref 70–99)
Potassium: 5.4 mEq/L — ABNORMAL HIGH (ref 3.5–5.1)
Sodium: 136 mEq/L (ref 135–145)
TOTAL PROTEIN: 8.1 g/dL (ref 6.0–8.3)

## 2016-01-29 LAB — SEDIMENTATION RATE: SED RATE: 9 mm/h (ref 0–30)

## 2016-01-29 LAB — MAGNESIUM: Magnesium: 1.6 mg/dL (ref 1.5–2.5)

## 2016-01-29 NOTE — Progress Notes (Signed)
Pre visit review using our clinic review tool, if applicable. No additional management support is needed unless otherwise documented below in the visit note. 

## 2016-01-29 NOTE — Patient Instructions (Signed)
For your muscle cramps I want to repeat studies to check your electrolytes and include mg level.  For low platelets see on recent and prior exam will get cbc and see if condition persists. If so refer to Dr. Marin Olp.  For your neuropathy. I want you to continue the cymbalta pending the lab results. I found some information that states rare side effect cymbalta and cramps will investigate with our pharmacist if they have more information.  Follow up date to be determined after lab review.

## 2016-01-29 NOTE — Progress Notes (Signed)
Subjective:    Patient ID: Alexis [name], female    DOB: [birth year], 65 y.o.   MRN: [id number]  HPI  Pt in for evaluation.  Pt went to ED since she had severe pain her calves. Pt states calf muscles cramping and even her toes are cramping. Then states other muscle cramp at time. One abdomen and other muscle cramps.   She states feels abdomen cramping sensation luq region. She massage area and pain will resolve. Pt had ck study, cmp, bnp, and wbc.  Pt states this has been going on for 2-3 weeks. She associate this with starting cymbalta. But she looked and researched and did not find association. I had given cymbalta for neuropathy complaints.   Pt did have low platlets. Low sodium and mild elevated liver enzymes.  On review pt states told in ED no indication to get lower ext US/to rule out DVT.      Review of Systems  Constitutional: Negative for chills and fatigue.  Respiratory: Negative for cough, chest tightness, shortness of breath and wheezing.   Cardiovascular: Negative for chest pain and palpitations.  Genitourinary: Negative for dysuria and flank pain.  Musculoskeletal:       Muscle cramps. Various areas. Worse in both calfs.  Skin: Negative for rash.  Neurological: Negative for dizziness, seizures, weakness and headaches.  Hematological: Negative for adenopathy. Does not bruise/bleed easily.  Psychiatric/Behavioral: Negative for behavioral problems and confusion.    Past Medical History:  Diagnosis Date  . Allergy   . Anxiety   . Arthritis   . Asthma   . Atrial fibrillation ([place])   . CHF (congestive heart failure) ([place])   . COPD (chronic obstructive pulmonary disease) ([place])   . Diabetes mellitus without complication ([place])   . Hyperlipidemia   . Hypertension   . Myocardial infarction   . Peripheral neuropathy ([place])   . Thyroid disease      Social History   Social History  . Marital status: Married    Spouse name: N/A  . Number of children: N/A    . Years of education: N/A   Occupational History  . Not on file.   Social History Main Topics  . Smoking status: Former Smoker    Quit date: 03/01/1998  . Smokeless tobacco: Never Used  . 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  . Drug use: No  . Sexual activity: Not on file   Other Topics Concern  . Not on file   Social History Narrative  . No narrative on file    Past Surgical History:  Procedure Laterality Date  . CORONARY ARTERY BYPASS GRAFT    . TOTAL KNEE ARTHROPLASTY      Family History  Problem Relation Age of Onset  . Diabetes Mother   . Hyperlipidemia Mother   . Diabetes Father   . Hyperlipidemia Father   . Heart disease Father     before age 37  . Heart attack Father   . Diabetes Brother   . Hyperlipidemia Brother   . Heart attack Brother     Allergies  Allergen Reactions  . Allopurinol Anaphylaxis  . Indomethacin Other (See Comments)    Other reaction(s): Other (See Comments) Renal Insufficiency  . Pregabalin Other (See Comments)    Other reaction(s): DIZZINESS Other reaction(s): DIZZINESS  . Sulfa Antibiotics Hives  . Sulfamethoxazole Hives    Current Outpatient Prescriptions on File Prior to Visit  Medication Sig Dispense Refill  . albuterol (PROVENTIL HFA;VENTOLIN HFA) 108 (90 Base) MCG/ACT inhaler Inhale 2 puffs into the lungs every 6 (six) hours as needed for wheezing or shortness of breath. 1 Inhaler 2  . allopurinol (ZYLOPRIM) 100 MG tablet Take ONE (1) tablet by mouth once daily 90 tablet 3  . apixaban (ELIQUIS) 5 MG TABS tablet Take 1 tablet (5 mg total) by mouth 2 (two) times daily. 60 tablet 6  . atorvastatin (LIPITOR) 10 MG tablet Take 1 tablet (10 mg total) by mouth daily. 30 tablet 0  . azelastine (ASTELIN) 0.1 % nasal spray Place 2 sprays into both nostrils 2 (two) times daily. Use in each nostril as directed 30 mL 3  . Blood Glucose Monitoring Suppl (GLUCOCOM BLOOD GLUCOSE MONITOR) DEVI  Use to check blood sugars 6 times daily E11.65    . cyclobenzaprine (FLEXERIL) 5 MG tablet 1 tab po q hs 5 tablet 0  . DULoxetine (CYMBALTA) 30 MG capsule Take 1 capsule (30 mg total) by mouth 2 (two) times daily. 60 capsule 0  . famciclovir (FAMVIR) 500 MG tablet Take 1 tablet (500 mg total) by mouth 3 (three) times daily. 21 tablet 0  . fluticasone (FLONASE) 50 MCG/ACT nasal spray Place 1 spray into both nostrils daily. 16 g 3  . fluticasone (FLOVENT HFA) 110 MCG/ACT inhaler Inhale 2 puffs into the lungs 2 (two) times daily. 1 Inhaler 3  . Insulin Disposable Pump (V-GO 40) KIT Inject 40 mg as directed 6 (six) times daily.    . Insulin Pen Needle (B-D ULTRAFINE III SHORT PEN) 31G X 8 MM MISC Use for insulin pen up to five times daily E11.65    . levothyroxine (SYNTHROID, LEVOTHROID) 175 MCG tablet Take 175 mcg by mouth daily before breakfast.    . metoprolol succinate (TOPROL-XL) 25 MG 24 hr tablet Take 1 tablet (25 mg total) by mouth daily. 90 tablet 3  . NOVOLOG 100 UNIT/ML injection Inject 40 Units into the skin 3 (three) times daily with meals.     . Prenatal Vit-Fe Fumarate-FA (MULTIVITAMIN-PRENATAL) 27-0.8 MG TABS tablet Take 1 tablet by mouth daily at 12 noon. Takes for nail and hair growth    . ramipril (ALTACE) 2.5 MG capsule Take ONE (1)  capsule by mouth once daily 90 capsule 1  . spironolactone (ALDACTONE) 25 MG tablet Take ONE (1) tablet by mouth once daily 90 tablet 1  . torsemide (DEMADEX) 10 MG tablet Take 1 tablet (10 mg total) by mouth daily. 90 tablet 1   No current facility-administered medications on file prior to visit.     BP 122/70 (BP Location: Left Arm, Patient Position: Sitting, Cuff Size: Normal)   Pulse (!) 45   Temp 98.1 F (36.7 C) (Oral)   Ht 5' 2"  (1.575 m)   Wt 226 lb (102.5 kg)   SpO2 98%   BMI 41.34 kg/m       Objective:   Physical Exam  General Mental Status- Alert. General Appearance- Not in acute distress.   Skin General: Color- Normal  Color. Moisture- Normal Moisture.  Neck Carotid Arteries- Normal color. Moisture- Normal Moisture. No carotid bruits. No JVD.  Chest and Lung Exam Auscultation: Breath Sounds:-Normal.  Cardiovascular Auscultation:Rythm- Regular. Murmurs & Other Heart Sounds:Auscultation of the heart reveals- No Murmurs.  Abdomen Inspection:-Inspeection Normal. Palpation/Percussion:Note:No mass. Palpation and Percussion of the abdomen reveal- Non Tender, Non Distended + BS, no rebound or guarding.(lying supine she states caused rt scapula area cramp and left  upper abd cramp briefly    Neurologic Cranial Nerve exam:- CN III-XII intact(No nystagmus), symmetric smile. Strength:- 5/5 equal and symmetric strength both upper and lower extremities.  Lower ext- no pedal edema. Calves symmetric and thin appearance.negative homans signs. Demonstrates when plantar flexes calves feel crampy.      Assessment & Plan:  For your muscle cramps I want to repeat studies to check your electrolytes and include mg level.  For low platelets see on recent and prior exam will get cbc and see if condition persists. If so refer to Dr. Marin Olp.  For your neuropathy. I want you to continue the cymbalta pending the lab results. I found some information that states rare side effect cymbalta and cramps will investigate with our pharmacist if they have more information.  Follow up date to be determined after lab review.  At the end of exam. Pt states has been on allopurinol for 3 years. She states no side effects and no reaction. Pt is sure of. So advised Barnet Pall to take off list but explain what pt reported.  Mischele Detter, Percell Miller, PA-C

## 2016-01-30 MED ORDER — SODIUM POLYSTYRENE SULFONATE 15 GM/60ML PO SUSP: ORAL | 0 refills | Status: DC

## 2016-01-30 NOTE — Telephone Encounter (Signed)
I just saw the telephone advise note from the other day before she was scheduled with me. She had cramps and per that note she had been taking the potassium for 3 days. So recent hyperkalemia? So along with advise on the other note. Make sure she stops the potassium tablet for time being. When labs back next week on recheck will decide if k tab necessary.

## 2016-02-02 NOTE — Telephone Encounter (Signed)
Noted. Called patient to schedule lab appt. Left a message for call back.

## 2016-02-03 ENCOUNTER — Telehealth: Payer: Self-pay | Admitting: Medical

## 2016-02-03 NOTE — Telephone Encounter (Signed)
Pt returned called.  Stating she has not been to the pharmacy to pick up kayexalate.  [facility] is going out of business, but will be transferring her profile over to [facility].  She called to verify that Percell Miller still wants her to take Kayexalate (if so resend to [place]) or should she come in tomorrow or Thursday for repeat labs without taking medication.    Please advise.

## 2016-02-03 NOTE — Telephone Encounter (Signed)
Called and left a message for call back  

## 2016-02-03 NOTE — Telephone Encounter (Signed)
Pt returned call.  States she is no longer taking potassium.  She does not plan to restart at this time.  Lab appt scheduled for tomorrow morning (02/04/16) at 9:15 am for STAT CMET.

## 2016-02-03 NOTE — Telephone Encounter (Signed)
I sent the lab result on 01-29-2016. So some delay already. Have her  get the lab tomorrow in am. Ask lab to run the lab stat. It is possible that k level has already come down. Remind her not to take extra potassium. I saw in phone note before she last saw me she took extra k tabs. Has she  been doing that since I last saw her?   Will decide on kayexalate after I get lab results back tomorrow.

## 2016-02-04 ENCOUNTER — Other Ambulatory Visit: Payer: Commercial Managed Care - HMO

## 2016-02-04 ENCOUNTER — Telehealth: Payer: Self-pay | Admitting: Medical

## 2016-02-04 ENCOUNTER — Other Ambulatory Visit (INDEPENDENT_AMBULATORY_CARE_PROVIDER_SITE_OTHER): Payer: Commercial Managed Care - HMO

## 2016-02-04 DIAGNOSIS — E875 Hyperkalemia: Secondary | ICD-10-CM | POA: Diagnosis not present

## 2016-02-04 LAB — COMPREHENSIVE METABOLIC PANEL
ALK PHOS: 153 U/L — AB (ref 39–117)
ALT: 54 U/L — ABNORMAL HIGH (ref 0–35)
AST: 61 U/L — ABNORMAL HIGH (ref 0–37)
Albumin: 4.2 g/dL (ref 3.5–5.2)
BUN: 30 mg/dL — ABNORMAL HIGH (ref 6–23)
CO2: 32 meq/L (ref 19–32)
Calcium: 10.3 mg/dL (ref 8.4–10.5)
Chloride: 98 mEq/L (ref 96–112)
Creatinine, Ser: 1.04 mg/dL (ref 0.40–1.20)
GFR: 56.18 mL/min — AB (ref >60.00)
GLUCOSE: 324 mg/dL — AB (ref 70–99)
POTASSIUM: 5.3 meq/L — AB (ref 3.5–5.1)
Sodium: 139 mEq/L (ref 135–145)
Total Bilirubin: 0.7 mg/dL (ref 0.2–1.2)
Total Protein: 8.2 g/dL (ref 6.0–8.3)

## 2016-02-04 MED ORDER — SODIUM POLYSTYRENE SULFONATE 15 GM/60ML PO SUSP: ORAL | 0 refills | Status: DC

## 2016-02-04 NOTE — Telephone Encounter (Signed)
Called patient and left a message for call back.  Rx for kayexalate sent to [name].

## 2016-02-04 NOTE — Telephone Encounter (Signed)
Alexis [name], patient's potassium still elevated.  Would you like for me to fax Kayexalate to [facility]?  Please advise.

## 2016-02-04 NOTE — Telephone Encounter (Signed)
Have her take the kayexalate and then repeat cmp on Monday or tuesday

## 2016-02-04 NOTE — Addendum Note (Signed)
Addended by: Caffie Pinto on: 02/04/2016 09:12 AM   Modules accepted: Orders

## 2016-02-05 ENCOUNTER — Other Ambulatory Visit: Payer: Self-pay

## 2016-02-05 DIAGNOSIS — E875 Hyperkalemia: Secondary | ICD-10-CM

## 2016-02-05 NOTE — Telephone Encounter (Signed)
See phone note dated 02/04/16.

## 2016-02-05 NOTE — Telephone Encounter (Signed)
Called and left a message for call back  

## 2016-02-10 ENCOUNTER — Ambulatory Visit (HOSPITAL_BASED_OUTPATIENT_CLINIC_OR_DEPARTMENT_OTHER)
Admission: RE | Admit: 2016-02-10 | Discharge: 2016-02-10 | Disposition: A | Discharge status: Home / Self Care | Payer: Commercial Managed Care - HMO | Source: Ambulatory Visit | Attending: Medical | Admitting: Medical

## 2016-02-10 ENCOUNTER — Ambulatory Visit (INDEPENDENT_AMBULATORY_CARE_PROVIDER_SITE_OTHER): Payer: Commercial Managed Care - HMO | Admitting: Medical

## 2016-02-10 ENCOUNTER — Other Ambulatory Visit: Payer: Commercial Managed Care - HMO

## 2016-02-10 ENCOUNTER — Encounter: Payer: Self-pay | Admitting: Medical

## 2016-02-10 VITALS — BP 120/70 | HR 78 | Temp 98.1°F | Ht 62.0 in | Wt 226.0 lb

## 2016-02-10 DIAGNOSIS — R05 Cough: Secondary | ICD-10-CM

## 2016-02-10 DIAGNOSIS — R059 Cough, unspecified: Secondary | ICD-10-CM

## 2016-02-10 DIAGNOSIS — J209 Acute bronchitis, unspecified: Secondary | ICD-10-CM | POA: Diagnosis not present

## 2016-02-10 DIAGNOSIS — Z8679 Personal history of other diseases of the circulatory system: Secondary | ICD-10-CM | POA: Diagnosis not present

## 2016-02-10 DIAGNOSIS — M791 Myalgia, unspecified site: Secondary | ICD-10-CM

## 2016-02-10 DIAGNOSIS — J0100 Acute maxillary sinusitis, unspecified: Secondary | ICD-10-CM

## 2016-02-10 DIAGNOSIS — R062 Wheezing: Secondary | ICD-10-CM

## 2016-02-10 LAB — COMPREHENSIVE METABOLIC PANEL
ALBUMIN: 4.4 g/dL (ref 3.5–5.2)
ALT: 64 U/L — AB (ref 0–35)
AST: 75 U/L — AB (ref 0–37)
Alkaline Phosphatase: 122 U/L — ABNORMAL HIGH (ref 39–117)
BILIRUBIN TOTAL: 0.7 mg/dL (ref 0.2–1.2)
BUN: 24 mg/dL — AB (ref 6–23)
CALCIUM: 10.6 mg/dL — AB (ref 8.4–10.5)
CHLORIDE: 97 meq/L (ref 96–112)
CO2: 28 meq/L (ref 19–32)
CREATININE: 0.83 mg/dL (ref 0.40–1.20)
GFR: 72.88 mL/min (ref >60.00)
Glucose, Bld: 251 mg/dL — ABNORMAL HIGH (ref 70–99)
Potassium: 3.9 mEq/L (ref 3.5–5.1)
SODIUM: 136 meq/L (ref 135–145)
Total Protein: 8.3 g/dL (ref 6.0–8.3)

## 2016-02-10 LAB — CBC WITH DIFFERENTIAL/PLATELET
BASOS ABS: 0 10*3/uL (ref 0.0–0.1)
Basophils Relative: 0.5 % (ref 0.0–3.0)
EOS ABS: 0.1 10*3/uL (ref 0.0–0.7)
Eosinophils Relative: 1.8 % (ref 0.0–5.0)
HEMATOCRIT: 43.2 % (ref 36.0–46.0)
HEMOGLOBIN: 15 g/dL (ref 12.0–15.0)
LYMPHS PCT: 20.3 % (ref 12.0–46.0)
Lymphs Abs: 1 10*3/uL (ref 0.7–4.0)
MCHC: 34.8 g/dL (ref 30.0–36.0)
MCV: 95.9 fl (ref 78.0–100.0)
MONO ABS: 0.5 10*3/uL (ref 0.1–1.0)
Monocytes Relative: 9.7 % (ref 3.0–12.0)
Neutro Abs: 3.5 10*3/uL (ref 1.4–7.7)
Neutrophils Relative %: 67.7 % (ref 43.0–77.0)
PLATELETS: 83 10*3/uL — AB (ref 150.0–400.0)
RBC: 4.51 Mil/uL (ref 3.87–5.11)
RDW: 14.9 % (ref 11.5–15.5)
WBC: 5.1 10*3/uL (ref 4.0–10.5)

## 2016-02-10 LAB — POCT INFLUENZA A/B
INFLUENZA A, POC: NEGATIVE
Influenza B, POC: NEGATIVE

## 2016-02-10 LAB — BRAIN NATRIURETIC PEPTIDE: Pro B Natriuretic peptide (BNP): 60 pg/mL (ref 0.0–100.0)

## 2016-02-10 MED ORDER — DOXYCYCLINE HYCLATE 100 MG PO TABS: 100.0000 mg | ORAL_TABLET | Freq: Two times a day (BID) | ORAL | 0 refills | Status: DC

## 2016-02-10 MED ORDER — ALBUTEROL SULFATE HFA 108 (90 BASE) MCG/ACT IN AERS: 2.0000 | INHALATION_SPRAY | Freq: Four times a day (QID) | RESPIRATORY_TRACT | 2 refills | Status: DC | PRN

## 2016-02-10 MED ORDER — HYDROCODONE-HOMATROPINE 5-1.5 MG/5ML PO SYRP: 5.0000 mL | ORAL_SOLUTION | Freq: Three times a day (TID) | ORAL | 0 refills | Status: DC | PRN

## 2016-02-10 MED ORDER — ALBUTEROL SULFATE (2.5 MG/3ML) 0.083% IN NEBU: 2.5000 mg | INHALATION_SOLUTION | Freq: Four times a day (QID) | RESPIRATORY_TRACT | 1 refills | Status: DC | PRN

## 2016-02-10 NOTE — Progress Notes (Signed)
Subjective:    Patient ID: Alexis [name], female    DOB: 1950-08-28, 65 y.o.   MRN: [id number]  HPI   Pt sick since Friday. Pt in with some nasal congestion and cough. She  had some fever, sweating.,chest congestion and some  wheezy cough. Pt has ventolin inhaler, astelin and flonse. Pt using ventolin about 3 times a day.   Pt is diabetic. Sugar this am was 255. Pt is on insulin. She has pump.   Pt was sweating on waiting room.  She has history of chf. Pt is on 20 mg of torosemide.  Some mild  Body aches sinee Sunday.    Review of Systems  Constitutional: Negative for chills and fatigue.  HENT: Positive for congestion and rhinorrhea. Negative for sinus pressure and sneezing.   Respiratory: Positive for cough and wheezing. Negative for chest tightness.   Cardiovascular: Negative for chest pain and palpitations.  Gastrointestinal: Negative for abdominal pain, blood in stool and constipation.  Endocrine: Negative for polydipsia, polyphagia and polyuria.  Musculoskeletal: Negative for back pain.       Pr had some achiness on Sunday. Pt grandaughter had the flu last week. Pt was exposed to grandaughter one day last week.  Neurological: Negative for dizziness, syncope, weakness and headaches.  Hematological: Negative for adenopathy. Does not bruise/bleed easily.  Psychiatric/Behavioral: Negative for behavioral problems and confusion.    Past Medical History:  Diagnosis Date  . Allergy   . Anxiety   . Arthritis   . Asthma   . Atrial fibrillation ([place])   . CHF (congestive heart failure) ([place])   . COPD (chronic obstructive pulmonary disease) ([place])   . Diabetes mellitus without complication ([place])   . Hyperlipidemia   . Hypertension   . Myocardial infarction   . Peripheral neuropathy ([place])   . Thyroid disease      Social History   Social History  . Marital status: Married    Spouse name: N/A  . Number of children: N/A  . Years of education: N/A   Occupational  History  . Not on file.   Social History Main Topics  . Smoking status: Former Smoker    Quit date: 03/01/1998  . Smokeless tobacco: Never Used  . 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  . Drug use: No  . Sexual activity: Not on file   Other Topics Concern  . Not on file   Social History Narrative  . No narrative on file    Past Surgical History:  Procedure Laterality Date  . CORONARY ARTERY BYPASS GRAFT    . TOTAL KNEE ARTHROPLASTY      Family History  Problem Relation Age of Onset  . Diabetes Mother   . Hyperlipidemia Mother   . Diabetes Father   . Hyperlipidemia Father   . Heart disease Father     before age 26  . Heart attack Father   . Diabetes Brother   . Hyperlipidemia Brother   . Heart attack Brother     Allergies  Allergen Reactions  . Indomethacin Other (See Comments)    Other reaction(s): Other (See Comments) Renal Insufficiency  . Pregabalin Other (See Comments)    Other reaction(s): DIZZINESS Other reaction(s): DIZZINESS  . Sulfa Antibiotics Hives  . Sulfamethoxazole Hives    Current Outpatient Prescriptions on File Prior to Visit  Medication Sig Dispense Refill  . albuterol (PROVENTIL HFA;VENTOLIN HFA) 108 (90 Base) MCG/ACT inhaler  Inhale 2 puffs into the lungs every 6 (six) hours as needed for wheezing or shortness of breath. 1 Inhaler 2  . allopurinol (ZYLOPRIM) 100 MG tablet Take ONE (1) tablet by mouth once daily 90 tablet 3  . apixaban (ELIQUIS) 5 MG TABS tablet Take 1 tablet (5 mg total) by mouth 2 (two) times daily. 60 tablet 6  . atorvastatin (LIPITOR) 10 MG tablet Take 1 tablet (10 mg total) by mouth daily. 30 tablet 0  . azelastine (ASTELIN) 0.1 % nasal spray Place 2 sprays into both nostrils 2 (two) times daily. Use in each nostril as directed 30 mL 3  . Blood Glucose Monitoring Suppl (GLUCOCOM BLOOD GLUCOSE MONITOR) DEVI Use to check blood sugars 6 times daily E11.65    . cyclobenzaprine  (FLEXERIL) 5 MG tablet 1 tab po q hs 5 tablet 0  . DULoxetine (CYMBALTA) 30 MG capsule Take 1 capsule (30 mg total) by mouth 2 (two) times daily. 60 capsule 0  . famciclovir (FAMVIR) 500 MG tablet Take 1 tablet (500 mg total) by mouth 3 (three) times daily. 21 tablet 0  . fluticasone (FLONASE) 50 MCG/ACT nasal spray Place 1 spray into both nostrils daily. 16 g 3  . fluticasone (FLOVENT HFA) 110 MCG/ACT inhaler Inhale 2 puffs into the lungs 2 (two) times daily. 1 Inhaler 3  . Insulin Disposable Pump (V-GO 40) KIT Inject 40 mg as directed 6 (six) times daily.    . Insulin Pen Needle (B-D ULTRAFINE III SHORT PEN) 31G X 8 MM MISC Use for insulin pen up to five times daily E11.65    . levothyroxine (SYNTHROID, LEVOTHROID) 175 MCG tablet Take 175 mcg by mouth daily before breakfast.    . metoprolol succinate (TOPROL-XL) 25 MG 24 hr tablet Take 1 tablet (25 mg total) by mouth daily. 90 tablet 3  . NOVOLOG 100 UNIT/ML injection Inject 40 Units into the skin 3 (three) times daily with meals.     . Prenatal Vit-Fe Fumarate-FA (MULTIVITAMIN-PRENATAL) 27-0.8 MG TABS tablet Take 1 tablet by mouth daily at 12 noon. Takes for nail and hair growth    . ramipril (ALTACE) 2.5 MG capsule Take ONE (1)  capsule by mouth once daily 90 capsule 1  . sodium polystyrene (KAYEXALATE) 15 GM/60ML suspension 60 ml po q day for 3 days 180 mL 0  . spironolactone (ALDACTONE) 25 MG tablet Take ONE (1) tablet by mouth once daily 90 tablet 1  . torsemide (DEMADEX) 10 MG tablet Take 1 tablet (10 mg total) by mouth daily. 90 tablet 1   No current facility-administered medications on file prior to visit.     BP 120/70 (BP Location: Left Arm, Patient Position: Sitting, Cuff Size: Normal)   Pulse 78   Temp 98.1 F (36.7 C) (Oral)   Ht 5' 2"  (1.575 m)   Wt 226 lb (102.5 kg)   SpO2 98%   BMI 41.34 kg/m       Objective:   Physical Exam  General  Mental Status - Alert. General Appearance - Well groomed. Not in acute  distress.  Skin Rashes- No Rashes.  HEENT Head- Normal. Ear Auditory Canal - Left- Normal. Right - Normal.Tympanic Membrane- Left- Normal. Right- Normal. Eye Sclera/Conjunctiva- Left- Normal. Right- Normal. Nose & Sinuses Nasal Mucosa- Left-  Boggy and Congested. Right-  Boggy and  Congested.Bilateral maxillary and frontal sinus pressure. Mouth & Throat Lips: Upper Lip- Normal: no dryness, cracking, pallor, cyanosis, or vesicular eruption. Lower Lip-Normal: no dryness, cracking, pallor, cyanosis  or vesicular eruption. Buccal Mucosa- Bilateral- No Aphthous ulcers. Oropharynx- No Discharge or Erythema. Tonsils: Characteristics- Bilateral- No Erythema or Congestion. Size/Enlargement- Bilateral- No enlargement. Discharge- bilateral-None.  Neck Neck- Supple. No Masses.   Chest and Lung Exam Auscultation: Breath Sounds:- even and unlabored. But scattered expiratory wheeze  Cardiovascular Auscultation:Rythm- Regular, rate and rhythm. Murmurs & Other Heart Sounds:Ausculatation of the heart reveal- No Murmurs.  Lymphatic Head & Neck General Head & Neck Lymphatics: Bilateral: Description- No Localized lymphadenopathy.  Lower ext- faint 1+ pedal edema at best. Neg homans signs.      Assessment & Plan:   You appear to have bronchitis and sinusitis. Rest hydrate and tylenol for fever. I am prescribing cough medicine hycodan, and doxcycine  antibiotic. For your nasal congestion continue astelin and nasal steroid.  For wheezing start the flovent I wrote in October. Refilled albuterol neb solution and albuterol inhaler.  If wheezing worsens then we may need to give taper prednisone.  Please get labs and cxr today. Will help [place] see if you have copd flare, chf, bronchitis or pneumonia(or possible combination of these?)  Follow up in 3-5 days or as needed

## 2016-02-10 NOTE — Progress Notes (Signed)
Pre visit review using our clinic review tool, if applicable. No additional management support is needed unless otherwise documented below in the visit note. 

## 2016-02-10 NOTE — Patient Instructions (Addendum)
You appear to have bronchitis and sinusitis. Rest hydrate and tylenol for fever. I am prescribing cough medicine hycodan, and doxcycine  antibiotic. For your nasal congestion continue astelin and nasal steroid.  For wheezing start the flovent I wrote in October. Refilled albuterol neb solution and albuterol inhaler.  If wheezing worsens then we may need to give taper prednisone.  Please get labs and cxr today. Will help [place] see if you have copd flare, chf, bronchitis or pneumonia(or possible combination of these?)  Follow up in 3-5 days or as needed

## 2016-02-12 ENCOUNTER — Encounter: Payer: Self-pay | Admitting: Medical

## 2016-02-12 ENCOUNTER — Telehealth: Payer: Self-pay | Admitting: Medical

## 2016-02-12 MED ORDER — CYCLOBENZAPRINE HCL 5 MG PO TABS: ORAL_TABLET | ORAL | 0 refills | Status: DC

## 2016-02-12 NOTE — Telephone Encounter (Signed)
Flexeril rx sent in today. Notify pt.

## 2016-02-15 ENCOUNTER — Encounter (HOSPITAL_BASED_OUTPATIENT_CLINIC_OR_DEPARTMENT_OTHER): Payer: Self-pay | Admitting: Emergency Medicine

## 2016-02-15 ENCOUNTER — Emergency Department (HOSPITAL_BASED_OUTPATIENT_CLINIC_OR_DEPARTMENT_OTHER)
Admission: EM | Admit: 2016-02-15 | Discharge: 2016-02-15 | Disposition: A | Discharge status: Home / Self Care | Payer: Commercial Managed Care - HMO | Attending: Emergency Medicine | Admitting: Emergency Medicine

## 2016-02-15 ENCOUNTER — Emergency Department (HOSPITAL_BASED_OUTPATIENT_CLINIC_OR_DEPARTMENT_OTHER): Payer: Commercial Managed Care - HMO

## 2016-02-15 DIAGNOSIS — Z951 Presence of aortocoronary bypass graft: Secondary | ICD-10-CM | POA: Insufficient documentation

## 2016-02-15 DIAGNOSIS — I25810 Atherosclerosis of coronary artery bypass graft(s) without angina pectoris: Secondary | ICD-10-CM | POA: Diagnosis not present

## 2016-02-15 DIAGNOSIS — J441 Chronic obstructive pulmonary disease with (acute) exacerbation: Secondary | ICD-10-CM | POA: Diagnosis not present

## 2016-02-15 DIAGNOSIS — Z794 Long term (current) use of insulin: Secondary | ICD-10-CM | POA: Insufficient documentation

## 2016-02-15 DIAGNOSIS — I509 Heart failure, unspecified: Secondary | ICD-10-CM | POA: Insufficient documentation

## 2016-02-15 DIAGNOSIS — Z87891 Personal history of nicotine dependence: Secondary | ICD-10-CM | POA: Diagnosis not present

## 2016-02-15 DIAGNOSIS — I110 Hypertensive heart disease with heart failure: Secondary | ICD-10-CM | POA: Insufficient documentation

## 2016-02-15 DIAGNOSIS — E119 Type 2 diabetes mellitus without complications: Secondary | ICD-10-CM | POA: Insufficient documentation

## 2016-02-15 DIAGNOSIS — J45909 Unspecified asthma, uncomplicated: Secondary | ICD-10-CM | POA: Insufficient documentation

## 2016-02-15 DIAGNOSIS — Z79899 Other long term (current) drug therapy: Secondary | ICD-10-CM | POA: Diagnosis not present

## 2016-02-15 DIAGNOSIS — R05 Cough: Secondary | ICD-10-CM | POA: Diagnosis not present

## 2016-02-15 DIAGNOSIS — R0602 Shortness of breath: Secondary | ICD-10-CM | POA: Diagnosis not present

## 2016-02-15 LAB — CBC
HEMATOCRIT: 39.9 % (ref 36.0–46.0)
Hemoglobin: 13.3 g/dL (ref 12.0–15.0)
MCH: 32.3 pg (ref 26.0–34.0)
MCHC: 33.3 g/dL (ref 30.0–36.0)
MCV: 96.8 fL (ref 78.0–100.0)
PLATELETS: 59 10*3/uL — AB (ref 150–400)
RBC: 4.12 MIL/uL (ref 3.87–5.11)
RDW: 13.8 % (ref 11.5–15.5)
WBC: 3.8 10*3/uL — AB (ref 4.0–10.5)

## 2016-02-15 LAB — COMPREHENSIVE METABOLIC PANEL
ALBUMIN: 3.6 g/dL (ref 3.5–5.0)
ALT: 47 U/L (ref 14–54)
ANION GAP: 11 (ref 5–15)
AST: 69 U/L — AB (ref 15–41)
Alkaline Phosphatase: 111 U/L (ref 38–126)
BILIRUBIN TOTAL: 0.7 mg/dL (ref 0.3–1.2)
BUN: 18 mg/dL (ref 6–20)
CHLORIDE: 96 mmol/L — AB (ref 101–111)
CO2: 28 mmol/L (ref 22–32)
Calcium: 9.4 mg/dL (ref 8.9–10.3)
Creatinine, Ser: 0.83 mg/dL (ref 0.44–1.00)
GFR calc Af Amer: >60 mL/min (ref >60)
GLUCOSE: 252 mg/dL — AB (ref 65–99)
POTASSIUM: 3.9 mmol/L (ref 3.5–5.1)
Sodium: 135 mmol/L (ref 135–145)
TOTAL PROTEIN: 7.8 g/dL (ref 6.5–8.1)

## 2016-02-15 LAB — BRAIN NATRIURETIC PEPTIDE: B Natriuretic Peptide: 58.1 pg/mL (ref 0.0–100.0)

## 2016-02-15 LAB — TROPONIN I

## 2016-02-15 MED ORDER — METHYLPREDNISOLONE SODIUM SUCC 125 MG IJ SOLR
125.0000 mg | Freq: Once | INTRAMUSCULAR | Status: AC
  Administered 2016-02-15: 125 mg via INTRAVENOUS
  Filled 2016-02-15: qty 2

## 2016-02-15 MED ORDER — IPRATROPIUM BROMIDE 0.02 % IN SOLN
0.5000 mg | Freq: Once | RESPIRATORY_TRACT | Status: AC
  Administered 2016-02-15: 0.5 mg via RESPIRATORY_TRACT
  Filled 2016-02-15: qty 2.5

## 2016-02-15 MED ORDER — ALBUTEROL SULFATE (2.5 MG/3ML) 0.083% IN NEBU
INHALATION_SOLUTION | RESPIRATORY_TRACT | Status: AC
  Administered 2016-02-15: 2.5 mg
  Filled 2016-02-15: qty 3

## 2016-02-15 MED ORDER — IPRATROPIUM-ALBUTEROL 0.5-2.5 (3) MG/3ML IN SOLN
RESPIRATORY_TRACT | Status: AC
  Administered 2016-02-15: 3 mL
  Filled 2016-02-15: qty 3

## 2016-02-15 MED ORDER — ALBUTEROL SULFATE (2.5 MG/3ML) 0.083% IN NEBU
5.0000 mg | INHALATION_SOLUTION | Freq: Once | RESPIRATORY_TRACT | Status: AC
  Administered 2016-02-15: 5 mg via RESPIRATORY_TRACT
  Filled 2016-02-15: qty 6

## 2016-02-15 MED ORDER — PREDNISONE 20 MG PO TABS: 40.0000 mg | ORAL_TABLET | Freq: Every day | ORAL | 0 refills | Status: AC

## 2016-02-15 NOTE — ED Provider Notes (Signed)
[facility] DEPT [facility] Provider Note   CSN: [id number] Arrival date & time: 02/15/16  1235     History   Chief Complaint Chief Complaint  Patient presents with  . Congestive Heart Failure    HPI Alexis [name] is a 65 y.o. female.  HPI Patient has a history of CVA as well as congestive heart failure.  She reports worsening cough and wheeze over the past 48 hours.  She's tried some albuterol at home without improvement in her symptoms.  She is currently not on steroids.  She reports her symptoms started about 5 days ago and she was started on doxycycline by her primary care physician.  Steroids were withheld given her history of diabetes.  She does have a history of congestive heart failure but reports no worsening lower [place] swelling and no new orthopnea.  She continues to sleep on 3 pillows which is baseline for her.  No history of DVT or pulmonary embolism.  No chest pain at this time.    Past Medical History:  Diagnosis Date  . Allergy   . Anxiety   . Arthritis   . Asthma   . Atrial fibrillation ([place])   . CHF (congestive heart failure) ([place])   . COPD (chronic obstructive pulmonary disease) ([place])   . Diabetes mellitus without complication ([place])   . Hyperlipidemia   . Hypertension   . Myocardial infarction   . Peripheral neuropathy ([place])   . Thyroid disease     Patient Active Problem List   Diagnosis Date Noted  . Hyperlipidemia 11/18/2015  . Gout 08/05/2014  . Cough 08/05/2014  . Diabetes ([place]) 07/12/2014  . Allergic rhinitis 07/12/2014  . Osteoarthritis 07/12/2014  . Asthma 07/12/2014  . CHF (congestive heart failure) ([place]) 07/12/2014  . CAD (coronary artery disease) 07/12/2014  . Atrial fibrillation ([place]) 07/12/2014  . HTN (hypertension) 07/12/2014  . Hypothyroidism 07/12/2014  . History of substance abuse 07/12/2014  . Peripheral vascular disease, unspecified 04/02/2013  . Discoloration of skin 04/02/2013    Past Surgical History:  Procedure  Laterality Date  . CORONARY ARTERY BYPASS GRAFT    . TOTAL KNEE ARTHROPLASTY      OB History    No data available       Home Medications    Prior to Admission medications   Medication Sig Start Date End Date Taking? Authorizing Provider  albuterol (PROVENTIL HFA;VENTOLIN HFA) 108 (90 Base) MCG/ACT inhaler Inhale 2 puffs into the lungs every 6 (six) hours as needed for wheezing or shortness of breath. 02/10/16  Yes Edward Saguier, PA-C  albuterol (PROVENTIL) (2.5 MG/3ML) 0.083% nebulizer solution Take 3 mLs (2.5 mg total) by nebulization every 6 (six) hours as needed for wheezing or shortness of breath. 02/10/16  Yes Edward Saguier, PA-C  allopurinol (ZYLOPRIM) 100 MG tablet Take ONE (1) tablet by mouth once daily 09/18/15  Yes Edward Saguier, PA-C  apixaban (ELIQUIS) 5 MG TABS tablet Take 1 tablet (5 mg total) by mouth 2 (two) times daily. 11/17/15  Yes Jettie Booze, MD  azelastine (ASTELIN) 0.1 % nasal spray Place 2 sprays into both nostrils 2 (two) times daily. Use in each nostril as directed 09/25/15  Yes Mackie Pai, PA-C  Blood Glucose Monitoring Suppl (GLUCOCOM BLOOD GLUCOSE MONITOR) DEVI Use to check blood sugars 6 times daily E11.65 05/01/15  Yes Historical Provider, MD  doxycycline (VIBRA-TABS) 100 MG tablet Take 1 tablet (100 mg total) by mouth 2 (two) times daily. Can give caps or generic 02/10/16  Yes Mackie Pai, PA-C  DULoxetine (CYMBALTA) 30 MG capsule Take 1 capsule (30 mg total) by mouth 2 (two) times daily. 01/05/16  Yes Edward Saguier, PA-C  fluticasone (FLONASE) 50 MCG/ACT nasal spray Place 1 spray into both nostrils daily. 09/16/15  Yes Edward Saguier, PA-C  HYDROcodone-homatropine (HYCODAN) 5-1.5 MG/5ML syrup Take 5 mLs by mouth every 8 (eight) hours as needed for cough. 02/10/16  Yes Edward Saguier, PA-C  Insulin Disposable Pump (V-GO 40) KIT Inject 40 mg as directed 6 (six) times daily. 11/21/15  Yes Historical Provider, MD  levothyroxine (SYNTHROID, LEVOTHROID)  175 MCG tablet Take 175 mcg by mouth daily before breakfast.   Yes Historical Provider, MD  metoprolol succinate (TOPROL-XL) 25 MG 24 hr tablet Take 1 tablet (25 mg total) by mouth daily. 09/18/15  Yes Edward Saguier, PA-C  Prenatal Vit-Fe Fumarate-FA (MULTIVITAMIN-PRENATAL) 27-0.8 MG TABS tablet Take 1 tablet by mouth daily at 12 noon. Takes for nail and hair growth   Yes Historical Provider, MD  ramipril (ALTACE) 2.5 MG capsule Take ONE (1)  capsule by mouth once daily 12/24/15  Yes Mackie Pai, PA-C  spironolactone (ALDACTONE) 25 MG tablet Take ONE (1) tablet by mouth once daily 09/18/15  Yes Edward Saguier, PA-C  torsemide (DEMADEX) 10 MG tablet Take 1 tablet (10 mg total) by mouth daily. 10/06/15  Yes Edward Saguier, PA-C  albuterol (PROVENTIL HFA;VENTOLIN HFA) 108 (90 Base) MCG/ACT inhaler Inhale 2 puffs into the lungs every 6 (six) hours as needed for wheezing or shortness of breath. 12/12/15   Percell Miller Saguier, PA-C  atorvastatin (LIPITOR) 10 MG tablet Take 1 tablet (10 mg total) by mouth daily. 08/28/14   Percell Miller Saguier, PA-C  cyclobenzaprine (FLEXERIL) 5 MG tablet 1 tab po q hs 02/12/16   Mackie Pai, PA-C  famciclovir (FAMVIR) 500 MG tablet Take 1 tablet (500 mg total) by mouth 3 (three) times daily. 12/12/15   Edward Saguier, PA-C  fluticasone (FLOVENT HFA) 110 MCG/ACT inhaler Inhale 2 puffs into the lungs 2 (two) times daily. 12/16/15   Percell Miller Saguier, PA-C  Insulin Pen Needle (B-D ULTRAFINE III SHORT PEN) 31G X 8 MM MISC Use for insulin pen up to five times daily E11.65 05/01/15   Historical Provider, MD  NOVOLOG 100 UNIT/ML injection Inject 40 Units into the skin 3 (three) times daily with meals.  10/22/15   Historical Provider, MD    Family History Family History  Problem Relation Age of Onset  . Diabetes Mother   . Hyperlipidemia Mother   . Diabetes Father   . Hyperlipidemia Father   . Heart disease Father     before age 11  . Heart attack Father   . Diabetes Brother   .  Hyperlipidemia Brother   . Heart attack Brother     Social History Social History  Substance Use Topics  . Smoking status: Former Smoker    Quit date: 03/01/1998  . Smokeless tobacco: Never Used  . 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     Allergies   Indomethacin; Pregabalin; Sulfa antibiotics; and Sulfamethoxazole   Review of Systems Review of Systems  All other systems reviewed and are negative.    Physical Exam Updated Vital Signs BP 134/74   Pulse 86   Temp 97.6 F (36.4 C) (Oral)   Resp 16   Ht 5' 2"  (1.575 m)   Wt 228 lb 8 oz (103.6 kg)   SpO2 98%   BMI  41.79 kg/m   Physical Exam  Constitutional: She is oriented to person, place, and time. She appears well-developed and well-nourished. No distress.  HENT:  Head: Normocephalic and atraumatic.  Eyes: EOM are normal.  Neck: Normal range of motion.  Cardiovascular: Normal rate, regular rhythm and normal heart sounds.   Pulmonary/Chest: Effort normal. She has wheezes. She has no rales.  Abdominal: Soft. She exhibits no distension. There is no tenderness.  Musculoskeletal: Normal range of motion.  1+ edema bilaterally  Neurological: She is alert and oriented to person, place, and time.  Skin: Skin is warm and dry.  Psychiatric: She has a normal mood and affect. Judgment normal.  Nursing note and vitals reviewed.    ED Treatments / Results  Labs (all labs ordered are listed, but only abnormal results are displayed) Labs Reviewed  CBC - Abnormal; Notable for the following:       Result Value   WBC 3.8 (*)    Platelets 59 (*)    All other components within normal limits  COMPREHENSIVE METABOLIC PANEL - Abnormal; Notable for the following:    Chloride 96 (*)    Glucose, Bld 252 (*)    AST 69 (*)    All other components within normal limits  TROPONIN I  BRAIN NATRIURETIC PEPTIDE    EKG  EKG Interpretation None       Radiology Dg Chest 2  View  Result Date: 02/15/2016 CLINICAL DATA:  Cough, wheezing, shortness of Breath EXAM: CHEST  2 VIEW COMPARISON:  02/10/2016 FINDINGS: Left pacer remains in place, unchanged. Heart is upper limits normal in size. No confluent airspace opacities or effusions. No acute bony abnormality. IMPRESSION: No active cardiopulmonary disease. Electronically Signed   By: Rolm Baptise M.D.   On: 02/15/2016 15:03    Procedures Procedures (including critical care time)  Medications Ordered in ED Medications  albuterol (PROVENTIL) (2.5 MG/3ML) 0.083% nebulizer solution (2.5 mg  Given 02/15/16 1320)  ipratropium-albuterol (DUONEB) 0.5-2.5 (3) MG/3ML nebulizer solution (3 mLs  Given 02/15/16 1320)  methylPREDNISolone sodium succinate (SOLU-MEDROL) 125 mg/2 mL injection 125 mg (125 mg Intravenous Given 02/15/16 1450)  albuterol (PROVENTIL) (2.5 MG/3ML) 0.083% nebulizer solution 5 mg (5 mg Nebulization Given 02/15/16 1502)  ipratropium (ATROVENT) nebulizer solution 0.5 mg (0.5 mg Nebulization Given 02/15/16 1502)     Initial Impression / Assessment and Plan / ED Course  I have reviewed the triage vital signs and the nursing notes.  Pertinent labs & imaging results that were available during my care of the patient were reviewed by me and considered in my medical decision making (see chart for details).  Clinical Course     Suspect COPD exacerbation.  Doubt congestive heart failure.  Chest x-ray without edema or effusions.  Will treat as COPD exacerbation with bronchodilators and steroids.  No evidence of pneumonia.  Will continue to monitor the patient closely while the emergency department monitor after bronchodilator therapy  3:51 PM Patient feels much better this time.  Discharge home with steroids.  She understands to return to the ER for new or worsening symptoms.  No signs of congestive heart failure.  Suspect COPD exacerbation  Final Clinical Impressions(s) / ED Diagnoses   Final diagnoses:   COPD exacerbation ([place])    New Prescriptions New Prescriptions   No medications on file     Jola Schmidt, MD 02/15/16 1551

## 2016-02-15 NOTE — ED Triage Notes (Signed)
Patient c/o wheezing & coughing, and reports she has "a lot of fluid". Patient has audible wheezing.

## 2016-02-16 ENCOUNTER — Telehealth: Payer: Self-pay | Admitting: Medical

## 2016-02-16 NOTE — Telephone Encounter (Signed)
Patient states that she was seen in the ER recently and she was concerned that she is not able to breath and her cough is worse. She states that her blood sugar was over 500 last night. She wanted to make [facility] aware that she was not feeling much better.  Patient has a follow up with Percell Miller on 02/18/16.

## 2016-02-16 NOTE — Telephone Encounter (Signed)
Caller name: Relationship to patient: Self Can be reached: [phone] Pharmacy:  Reason for call: Patient would like a call back to discuss her recent visit to the ER. States she will explain to the nurse.

## 2016-02-18 ENCOUNTER — Ambulatory Visit: Payer: Commercial Managed Care - HMO | Admitting: Medical

## 2016-02-24 ENCOUNTER — Encounter: Payer: Self-pay | Admitting: Medical

## 2016-02-25 MED ORDER — DULOXETINE HCL 30 MG PO CPEP: 30.0000 mg | ORAL_CAPSULE | Freq: Two times a day (BID) | ORAL | 1 refills | Status: DC

## 2016-03-30 DIAGNOSIS — I739 Peripheral vascular disease, unspecified: Secondary | ICD-10-CM | POA: Diagnosis not present

## 2016-03-30 DIAGNOSIS — E1142 Type 2 diabetes mellitus with diabetic polyneuropathy: Secondary | ICD-10-CM | POA: Diagnosis not present

## 2016-04-04 ENCOUNTER — Encounter: Payer: Self-pay | Admitting: Medical

## 2016-04-05 ENCOUNTER — Observation Stay (HOSPITAL_BASED_OUTPATIENT_CLINIC_OR_DEPARTMENT_OTHER)
Admission: EM | Admit: 2016-04-05 | Discharge: 2016-04-08 | Disposition: A | Discharge status: Home / Self Care | Payer: Medicare HMO | Attending: Internal Medicine | Admitting: Internal Medicine

## 2016-04-05 ENCOUNTER — Emergency Department (HOSPITAL_BASED_OUTPATIENT_CLINIC_OR_DEPARTMENT_OTHER): Payer: Medicare HMO

## 2016-04-05 ENCOUNTER — Encounter (HOSPITAL_BASED_OUTPATIENT_CLINIC_OR_DEPARTMENT_OTHER): Payer: Self-pay | Admitting: Emergency Medicine

## 2016-04-05 DIAGNOSIS — I481 Persistent atrial fibrillation: Secondary | ICD-10-CM | POA: Diagnosis not present

## 2016-04-05 DIAGNOSIS — D696 Thrombocytopenia, unspecified: Secondary | ICD-10-CM | POA: Diagnosis not present

## 2016-04-05 DIAGNOSIS — E1151 Type 2 diabetes mellitus with diabetic peripheral angiopathy without gangrene: Secondary | ICD-10-CM | POA: Diagnosis not present

## 2016-04-05 DIAGNOSIS — I4891 Unspecified atrial fibrillation: Secondary | ICD-10-CM | POA: Diagnosis not present

## 2016-04-05 DIAGNOSIS — Z9641 Presence of insulin pump (external) (internal): Secondary | ICD-10-CM | POA: Insufficient documentation

## 2016-04-05 DIAGNOSIS — I110 Hypertensive heart disease with heart failure: Secondary | ICD-10-CM | POA: Diagnosis not present

## 2016-04-05 DIAGNOSIS — R739 Hyperglycemia, unspecified: Secondary | ICD-10-CM

## 2016-04-05 DIAGNOSIS — Z79899 Other long term (current) drug therapy: Secondary | ICD-10-CM | POA: Insufficient documentation

## 2016-04-05 DIAGNOSIS — Z7901 Long term (current) use of anticoagulants: Secondary | ICD-10-CM | POA: Diagnosis not present

## 2016-04-05 DIAGNOSIS — Z6841 Body Mass Index (BMI) 40.0 and over, adult: Secondary | ICD-10-CM | POA: Diagnosis not present

## 2016-04-05 DIAGNOSIS — I4821 Permanent atrial fibrillation: Secondary | ICD-10-CM | POA: Diagnosis present

## 2016-04-05 DIAGNOSIS — R1011 Right upper quadrant pain: Secondary | ICD-10-CM

## 2016-04-05 DIAGNOSIS — M79671 Pain in right foot: Secondary | ICD-10-CM | POA: Diagnosis not present

## 2016-04-05 DIAGNOSIS — R0600 Dyspnea, unspecified: Secondary | ICD-10-CM

## 2016-04-05 DIAGNOSIS — E1165 Type 2 diabetes mellitus with hyperglycemia: Secondary | ICD-10-CM | POA: Insufficient documentation

## 2016-04-05 DIAGNOSIS — Z794 Long term (current) use of insulin: Secondary | ICD-10-CM | POA: Diagnosis not present

## 2016-04-05 DIAGNOSIS — W19XXXA Unspecified fall, initial encounter: Secondary | ICD-10-CM | POA: Diagnosis not present

## 2016-04-05 DIAGNOSIS — E785 Hyperlipidemia, unspecified: Secondary | ICD-10-CM | POA: Insufficient documentation

## 2016-04-05 DIAGNOSIS — J449 Chronic obstructive pulmonary disease, unspecified: Secondary | ICD-10-CM | POA: Diagnosis not present

## 2016-04-05 DIAGNOSIS — F419 Anxiety disorder, unspecified: Secondary | ICD-10-CM | POA: Diagnosis not present

## 2016-04-05 DIAGNOSIS — I2510 Atherosclerotic heart disease of native coronary artery without angina pectoris: Secondary | ICD-10-CM | POA: Insufficient documentation

## 2016-04-05 DIAGNOSIS — I252 Old myocardial infarction: Secondary | ICD-10-CM | POA: Insufficient documentation

## 2016-04-05 DIAGNOSIS — I4819 Other persistent atrial fibrillation: Secondary | ICD-10-CM

## 2016-04-05 DIAGNOSIS — Z7951 Long term (current) use of inhaled steroids: Secondary | ICD-10-CM | POA: Insufficient documentation

## 2016-04-05 DIAGNOSIS — K8020 Calculus of gallbladder without cholecystitis without obstruction: Secondary | ICD-10-CM | POA: Diagnosis not present

## 2016-04-05 DIAGNOSIS — R112 Nausea with vomiting, unspecified: Secondary | ICD-10-CM

## 2016-04-05 DIAGNOSIS — R1110 Vomiting, unspecified: Secondary | ICD-10-CM | POA: Diagnosis present

## 2016-04-05 DIAGNOSIS — E039 Hypothyroidism, unspecified: Secondary | ICD-10-CM | POA: Insufficient documentation

## 2016-04-05 DIAGNOSIS — R0602 Shortness of breath: Secondary | ICD-10-CM | POA: Diagnosis not present

## 2016-04-05 DIAGNOSIS — R197 Diarrhea, unspecified: Secondary | ICD-10-CM | POA: Diagnosis not present

## 2016-04-05 DIAGNOSIS — I5022 Chronic systolic (congestive) heart failure: Secondary | ICD-10-CM | POA: Diagnosis not present

## 2016-04-05 DIAGNOSIS — A0819 Acute gastroenteropathy due to other small round viruses: Principal | ICD-10-CM | POA: Insufficient documentation

## 2016-04-05 DIAGNOSIS — E119 Type 2 diabetes mellitus without complications: Secondary | ICD-10-CM

## 2016-04-05 DIAGNOSIS — M1990 Unspecified osteoarthritis, unspecified site: Secondary | ICD-10-CM | POA: Insufficient documentation

## 2016-04-05 DIAGNOSIS — Z66 Do not resuscitate: Secondary | ICD-10-CM | POA: Insufficient documentation

## 2016-04-05 DIAGNOSIS — Z87891 Personal history of nicotine dependence: Secondary | ICD-10-CM | POA: Insufficient documentation

## 2016-04-05 DIAGNOSIS — Z950 Presence of cardiac pacemaker: Secondary | ICD-10-CM | POA: Diagnosis not present

## 2016-04-05 DIAGNOSIS — I739 Peripheral vascular disease, unspecified: Secondary | ICD-10-CM | POA: Diagnosis present

## 2016-04-05 HISTORY — DX: Presence of cardiac pacemaker: Z95.0

## 2016-04-05 LAB — COMPREHENSIVE METABOLIC PANEL
ALK PHOS: 155 U/L — AB (ref 38–126)
ALT: 70 U/L — ABNORMAL HIGH (ref 14–54)
ANION GAP: 12 (ref 5–15)
AST: 85 U/L — ABNORMAL HIGH (ref 15–41)
Albumin: 4.3 g/dL (ref 3.5–5.0)
BILIRUBIN TOTAL: 1.3 mg/dL — AB (ref 0.3–1.2)
BUN: 34 mg/dL — ABNORMAL HIGH (ref 6–20)
CALCIUM: 10 mg/dL (ref 8.9–10.3)
CO2: 29 mmol/L (ref 22–32)
CREATININE: 0.93 mg/dL (ref 0.44–1.00)
Chloride: 97 mmol/L — ABNORMAL LOW (ref 101–111)
Glucose, Bld: 335 mg/dL — ABNORMAL HIGH (ref 65–99)
Potassium: 4.7 mmol/L (ref 3.5–5.1)
SODIUM: 138 mmol/L (ref 135–145)
TOTAL PROTEIN: 8.9 g/dL — AB (ref 6.5–8.1)

## 2016-04-05 LAB — CBG MONITORING, ED
GLUCOSE-CAPILLARY: 338 mg/dL — AB (ref 65–99)
GLUCOSE-CAPILLARY: 283 mg/dL — AB (ref 65–99)
Glucose-Capillary: 312 mg/dL — ABNORMAL HIGH (ref 65–99)

## 2016-04-05 LAB — I-STAT CG4 LACTIC ACID, ED
LACTIC ACID, VENOUS: 3.16 mmol/L — AB (ref 0.5–1.9)
LACTIC ACID, VENOUS: 3.21 mmol/L — AB (ref 0.5–1.9)

## 2016-04-05 LAB — URINALYSIS, ROUTINE W REFLEX MICROSCOPIC
Bilirubin Urine: NEGATIVE
Glucose, UA: 250 mg/dL — AB
Hgb urine dipstick: NEGATIVE
Ketones, ur: NEGATIVE mg/dL
NITRITE: NEGATIVE
PROTEIN: NEGATIVE mg/dL
SPECIFIC GRAVITY, URINE: 1.022 (ref 1.005–1.030)
pH: 6 (ref 5.0–8.0)

## 2016-04-05 LAB — CBC
HCT: 48.7 % — ABNORMAL HIGH (ref 36.0–46.0)
HEMOGLOBIN: 16.6 g/dL — AB (ref 12.0–15.0)
MCH: 33.3 pg (ref 26.0–34.0)
MCHC: 34.1 g/dL (ref 30.0–36.0)
MCV: 97.6 fL (ref 78.0–100.0)
PLATELETS: 92 10*3/uL — AB (ref 150–400)
RBC: 4.99 MIL/uL (ref 3.87–5.11)
RDW: 13.8 % (ref 11.5–15.5)
WBC: 5.7 10*3/uL (ref 4.0–10.5)

## 2016-04-05 LAB — LIPASE, BLOOD: Lipase: 49 U/L (ref 11–51)

## 2016-04-05 LAB — URINALYSIS, MICROSCOPIC (REFLEX): RBC / HPF: NONE SEEN RBC/hpf (ref 0–5)

## 2016-04-05 LAB — GLUCOSE, CAPILLARY: Glucose-Capillary: 240 mg/dL — ABNORMAL HIGH (ref 65–99)

## 2016-04-05 MED ORDER — INSULIN ASPART 100 UNIT/ML ~~LOC~~ SOLN
5.0000 [IU] | Freq: Once | SUBCUTANEOUS | Status: AC
  Administered 2016-04-05: 5 [IU] via SUBCUTANEOUS
  Filled 2016-04-05: qty 1

## 2016-04-05 MED ORDER — ACETAMINOPHEN 650 MG RE SUPP: 650.0000 mg | Freq: Four times a day (QID) | RECTAL | Status: DC | PRN

## 2016-04-05 MED ORDER — METOCLOPRAMIDE HCL 5 MG/ML IJ SOLN: 10.0000 mg | INTRAMUSCULAR | Status: DC

## 2016-04-05 MED ORDER — FLUTICASONE PROPIONATE 50 MCG/ACT NA SUSP
1.0000 | Freq: Every day | NASAL | Status: DC
  Administered 2016-04-06 – 2016-04-08 (×3): 1 via NASAL
  Filled 2016-04-05: qty 16

## 2016-04-05 MED ORDER — LEVOTHYROXINE SODIUM 75 MCG PO TABS
175.0000 ug | ORAL_TABLET | Freq: Every day | ORAL | Status: DC
  Administered 2016-04-06 – 2016-04-08 (×3): 175 ug via ORAL
  Filled 2016-04-05 (×4): qty 1

## 2016-04-05 MED ORDER — ALLOPURINOL 100 MG PO TABS
100.0000 mg | ORAL_TABLET | Freq: Every day | ORAL | Status: DC
  Administered 2016-04-06 – 2016-04-08 (×3): 100 mg via ORAL
  Filled 2016-04-05 (×4): qty 1

## 2016-04-05 MED ORDER — AZELASTINE HCL 0.1 % NA SOLN
2.0000 | Freq: Two times a day (BID) | NASAL | Status: DC
  Administered 2016-04-05: 2 via NASAL
  Filled 2016-04-05: qty 30

## 2016-04-05 MED ORDER — DILTIAZEM HCL 25 MG/5ML IV SOLN
10.0000 mg | Freq: Once | INTRAVENOUS | Status: AC
  Administered 2016-04-05: 10 mg via INTRAVENOUS
  Filled 2016-04-05: qty 5

## 2016-04-05 MED ORDER — BUDESONIDE 0.25 MG/2ML IN SUSP
0.2500 mg | Freq: Two times a day (BID) | RESPIRATORY_TRACT | Status: DC
  Filled 2016-04-05 (×2): qty 2

## 2016-04-05 MED ORDER — DULOXETINE HCL 30 MG PO CPEP
30.0000 mg | ORAL_CAPSULE | Freq: Two times a day (BID) | ORAL | Status: DC
  Administered 2016-04-05 – 2016-04-06 (×2): 30 mg via ORAL
  Filled 2016-04-05 (×2): qty 1

## 2016-04-05 MED ORDER — ATORVASTATIN CALCIUM 10 MG PO TABS
10.0000 mg | ORAL_TABLET | Freq: Every day | ORAL | Status: DC
  Administered 2016-04-06 – 2016-04-07 (×2): 10 mg via ORAL
  Filled 2016-04-05 (×3): qty 1

## 2016-04-05 MED ORDER — METOPROLOL SUCCINATE ER 25 MG PO TB24
25.0000 mg | ORAL_TABLET | Freq: Every day | ORAL | Status: DC
  Administered 2016-04-06: 25 mg via ORAL
  Filled 2016-04-05 (×2): qty 1

## 2016-04-05 MED ORDER — ONDANSETRON HCL 4 MG/2ML IJ SOLN: 4.0000 mg | Freq: Four times a day (QID) | INTRAMUSCULAR | Status: DC | PRN

## 2016-04-05 MED ORDER — DOXYCYCLINE HYCLATE 100 MG PO TABS
100.0000 mg | ORAL_TABLET | Freq: Once | ORAL | Status: AC
  Administered 2016-04-05: 100 mg via ORAL
  Filled 2016-04-05: qty 1

## 2016-04-05 MED ORDER — ONDANSETRON HCL 4 MG/2ML IJ SOLN
4.0000 mg | Freq: Once | INTRAMUSCULAR | Status: AC
  Administered 2016-04-05: 4 mg via INTRAVENOUS
  Filled 2016-04-05: qty 2

## 2016-04-05 MED ORDER — ALBUTEROL SULFATE (2.5 MG/3ML) 0.083% IN NEBU: 2.5000 mg | INHALATION_SOLUTION | Freq: Four times a day (QID) | RESPIRATORY_TRACT | Status: DC | PRN

## 2016-04-05 MED ORDER — PROCHLORPERAZINE EDISYLATE 5 MG/ML IJ SOLN
10.0000 mg | Freq: Once | INTRAMUSCULAR | Status: AC
  Administered 2016-04-05: 10 mg via INTRAVENOUS
  Filled 2016-04-05: qty 2

## 2016-04-05 MED ORDER — SODIUM CHLORIDE 0.9 % IV BOLUS (SEPSIS)
1000.0000 mL | Freq: Once | INTRAVENOUS | Status: AC
  Administered 2016-04-05: 1000 mL via INTRAVENOUS

## 2016-04-05 MED ORDER — APIXABAN 5 MG PO TABS
5.0000 mg | ORAL_TABLET | Freq: Two times a day (BID) | ORAL | Status: DC
  Administered 2016-04-05 – 2016-04-08 (×6): 5 mg via ORAL
  Filled 2016-04-05 (×6): qty 1

## 2016-04-05 MED ORDER — SODIUM CHLORIDE 0.9 % IV SOLN
Freq: Once | INTRAVENOUS | Status: AC
  Administered 2016-04-05: 17:00:00 via INTRAVENOUS

## 2016-04-05 MED ORDER — V-GO 40 KIT: 40.0000 mg | PACK | Freq: Every day | Status: DC

## 2016-04-05 MED ORDER — ACETAMINOPHEN 325 MG PO TABS: 650.0000 mg | ORAL_TABLET | Freq: Four times a day (QID) | ORAL | Status: DC | PRN

## 2016-04-05 MED ORDER — INSULIN ASPART 100 UNIT/ML ~~LOC~~ SOLN
0.0000 [IU] | Freq: Every day | SUBCUTANEOUS | Status: DC
  Administered 2016-04-05 – 2016-04-06 (×2): 2 [IU] via SUBCUTANEOUS

## 2016-04-05 MED ORDER — METOPROLOL TARTRATE 50 MG PO TABS
25.0000 mg | ORAL_TABLET | Freq: Once | ORAL | Status: AC
  Administered 2016-04-05: 25 mg via ORAL
  Filled 2016-04-05: qty 1

## 2016-04-05 MED ORDER — SODIUM CHLORIDE 0.9% FLUSH
3.0000 mL | Freq: Two times a day (BID) | INTRAVENOUS | Status: DC
  Administered 2016-04-05 – 2016-04-08 (×5): 3 mL via INTRAVENOUS

## 2016-04-05 MED ORDER — INSULIN ASPART 100 UNIT/ML ~~LOC~~ SOLN
0.0000 [IU] | Freq: Three times a day (TID) | SUBCUTANEOUS | Status: DC
  Administered 2016-04-06: 5 [IU] via SUBCUTANEOUS
  Administered 2016-04-06: 8 [IU] via SUBCUTANEOUS
  Administered 2016-04-06: 3 [IU] via SUBCUTANEOUS
  Administered 2016-04-07 (×2): 5 [IU] via SUBCUTANEOUS
  Administered 2016-04-07 – 2016-04-08 (×2): 3 [IU] via SUBCUTANEOUS
  Administered 2016-04-08: 5 [IU] via SUBCUTANEOUS

## 2016-04-05 MED ORDER — LACTATED RINGERS IV SOLN
INTRAVENOUS | Status: DC
  Administered 2016-04-05 – 2016-04-06 (×3): via INTRAVENOUS

## 2016-04-05 MED ORDER — RAMIPRIL 2.5 MG PO CAPS
2.5000 mg | ORAL_CAPSULE | Freq: Every day | ORAL | Status: DC
  Administered 2016-04-06 – 2016-04-08 (×3): 2.5 mg via ORAL
  Filled 2016-04-05 (×4): qty 1

## 2016-04-05 MED ORDER — ONDANSETRON HCL 4 MG/2ML IJ SOLN
4.0000 mg | Freq: Once | INTRAMUSCULAR | Status: AC | PRN
  Administered 2016-04-05: 4 mg via INTRAVENOUS
  Filled 2016-04-05: qty 2

## 2016-04-05 MED ORDER — METOPROLOL SUCCINATE ER 25 MG PO TB24
25.0000 mg | ORAL_TABLET | Freq: Every day | ORAL | Status: DC
  Filled 2016-04-05: qty 1

## 2016-04-05 MED ORDER — ONDANSETRON HCL 4 MG PO TABS
4.0000 mg | ORAL_TABLET | Freq: Four times a day (QID) | ORAL | Status: DC | PRN
  Administered 2016-04-07: 4 mg via ORAL
  Filled 2016-04-05: qty 1

## 2016-04-05 NOTE — ED Triage Notes (Signed)
Pt has implanted pacemaker by [employer] and an insulin pump.

## 2016-04-05 NOTE — Treatment Plan (Signed)
Discussed case with Dr. Rex Kras at [facility] 66yo with hx of DM, afib on eliquis, HTN, cad presents with n/v/d with persistent lactate "not moving forward despite treatment." Multiple episodes of diarrhea in the ED. Insulin pump out of insulin. No chest pain or sob. Peak HR in the 120's improved with cardizem. UA unremarkable. No leukocytosis. RUQ US unremarkable for acute process. Concerns for lactic acidosis, afib RVR, n/v/d with dehydration. Vitals currently 119/82, HR 110.   Will likely require IVF, stool studies, further imaging if warranted. Accepted pt to med-tele obs at [facility]

## 2016-04-05 NOTE — ED Notes (Signed)
Pt having an episode of nausea with vomiting and diarrhea, EDP notifed

## 2016-04-05 NOTE — H&P (Addendum)
History and Physical    Alexis [name] QQP:[id number] DOB: Jun 17, 1950 DOA: 04/05/2016  PCP: Mackie Pai, PA-C Consultants:  Cardiology - [place]; Endocrinology - Ronnald Ramp; Neurology - Laurie Panda (?) Patient coming from: home - lives with husband; NOK: husband  Chief Complaint: N/V/D  HPI: Alexis [name] is a 66 y.o. female with medical history significant of CHF, afib on anticoagulation, CAD, and COPD presenting with n/v/d.  Also reports she is in afib with RVR.  Symptoms started at 0300 this AM.  Awoke feeling nauseated.  Ate something, concerned that sugar was low.  Went back to bed but then she started vomiting like crazy.  Started with diarrhea after that.  Maybe 10 episodes of vomiting.  Stools x 4-5 times.  Stools are loose, not watery.  Had antibiotics in December for respiratory infection.  No fever.  No sick contacts.  Chronic SOB and feels like her heart is racing chronically, but SOB is better right now so thinks her HR is better.   Concerned that her pacemaker is not acting right.  Would like to have consult for pacemaker calibration while here.   ED Course: Per Dr. Rex Kras: Pt w/ nausea and vomiting beginning overnight and one episode of diarrhea. She was uncomfortable but nontoxic on exam. Vital signs showed variable heart rate, A. fib on the monitor, 80s-120. BP 96/54. No abdominal tenderness or distention. Initial lactate 3.16, blood glucose 335 with normal creatinine and normal anion gap. LFTs mildly elevated at AST 85, ALT 70, total bili 1.3. I did obtain right upper quadrant ultrasound given these abnormal findings in the patient's vomiting. RUQ US shows Chronic cholelithiasis with no evidence of acute cholecystitis or obstruction.  Pt incidentally noted 2 week rash and pain on R toes. Exam appears c/w athletes foot given peeling skin and interweb space rash but may be superinfected w/ bacteria given pain. Will treat w/ topical antifungal as well as doxycycline.  Patient has required  multiple doses of antibiotics due to ongoing vomiting. Repeat lactate after IV fluids was persistent at 3.21. I considered sepsis but the remainder of her VS are normal and I suspect her ongoing lactate is due to continued fluid losses. Her repeat blood glucose was 312 and she later informed the nurse that her insulin pump had run out. Gave him 5 units NovoLog. I have given her a small bolus of diltiazem to control A fib w/ RVR. She continues to have profuse diarrhea here and I am concerned about her ongoing fluid losses with persistent lactic acidosis and atrial fibrillation w/ RVR. Given her continued symptoms and comorbidities, recommended admission. Discussed transfer with hospitalist, Dr. Wyline Copas, appreciate assistance. Pt will be [facility] for admission.   Review of Systems: As per HPI; otherwise 10 point review of systems reviewed and negative.   Ambulatory Status:  Ambulates without difficulty  Past Medical History:  Diagnosis Date  . Allergy   . Anxiety   . Arthritis   . Asthma   . Atrial fibrillation ([place])   . CHF (congestive heart failure) ([place])   . COPD (chronic obstructive pulmonary disease) ([place])   . Diabetes mellitus without complication ([place])   . Hyperlipidemia   . Hypertension   . Myocardial infarction    several  . Peripheral neuropathy ([place])   . Thyroid disease     Past Surgical History:  Procedure Laterality Date  . RADIOFREQUENCY ABLATION    . TOTAL KNEE ARTHROPLASTY      Social History   Social  History  . Marital status: Married    Spouse name: N/A  . Number of children: N/A  . Years of education: N/A   Occupational History  . retired    Social History Main Topics  . Smoking status: Former Smoker    Quit date: 03/01/1998  . Smokeless tobacco: Never Used  . 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  . Drug use: No     Comment: remote use  . Sexual activity: Not on file   Other Topics Concern  . Not  on file   Social History Narrative  . No narrative on file    Allergies  Allergen Reactions  . Indomethacin Other (See Comments)     Renal Insufficiency  . Pregabalin Other (See Comments)    Other reaction(s): DIZZINESS   . Sulfa Antibiotics Hives  . Sulfamethoxazole Hives    Family History  Problem Relation Age of Onset  . Diabetes Mother   . Hyperlipidemia Mother   . Diabetes Father   . Hyperlipidemia Father   . Heart disease Father     before age 74  . Heart attack Father   . Diabetes Brother   . Hyperlipidemia Brother   . Heart attack Brother     Prior to Admission medications   Medication Sig Start Date End Date Taking? Authorizing Provider  albuterol (PROVENTIL HFA;VENTOLIN HFA) 108 (90 Base) MCG/ACT inhaler Inhale 2 puffs into the lungs every 6 (six) hours as needed for wheezing or shortness of breath. 12/12/15  Yes Edward Saguier, PA-C  albuterol (PROVENTIL HFA;VENTOLIN HFA) 108 (90 Base) MCG/ACT inhaler Inhale 2 puffs into the lungs every 6 (six) hours as needed for wheezing or shortness of breath. 02/10/16  Yes Edward Saguier, PA-C  albuterol (PROVENTIL) (2.5 MG/3ML) 0.083% nebulizer solution Take 3 mLs (2.5 mg total) by nebulization every 6 (six) hours as needed for wheezing or shortness of breath. 02/10/16  Yes Edward Saguier, PA-C  allopurinol (ZYLOPRIM) 100 MG tablet Take ONE (1) tablet by mouth once daily 09/18/15  Yes Edward Saguier, PA-C  apixaban (ELIQUIS) 5 MG TABS tablet Take 1 tablet (5 mg total) by mouth 2 (two) times daily. 11/17/15  Yes Jettie Booze, MD  azelastine (ASTELIN) 0.1 % nasal spray Place 2 sprays into both nostrils 2 (two) times daily. Use in each nostril as directed 09/25/15  Yes Mackie Pai, PA-C  Blood Glucose Monitoring Suppl (GLUCOCOM BLOOD GLUCOSE MONITOR) DEVI Use to check blood sugars 6 times daily E11.65 05/01/15  Yes Historical Provider, MD  DULoxetine (CYMBALTA) 30 MG capsule Take 1 capsule (30 mg total) by mouth 2 (two)  times daily. 02/25/16  Yes Edward Saguier, PA-C  fluticasone (FLONASE) 50 MCG/ACT nasal spray Place 1 spray into both nostrils daily. 09/16/15  Yes Edward Saguier, PA-C  fluticasone (FLOVENT HFA) 110 MCG/ACT inhaler Inhale 2 puffs into the lungs 2 (two) times daily. 12/16/15  Yes Edward Saguier, PA-C  HYDROcodone-homatropine (HYCODAN) 5-1.5 MG/5ML syrup Take 5 mLs by mouth every 8 (eight) hours as needed for cough. 02/10/16  Yes Edward Saguier, PA-C  Insulin Disposable Pump (V-GO 40) KIT Inject 40 mg as directed 6 (six) times daily. 11/21/15  Yes Historical Provider, MD  levothyroxine (SYNTHROID, LEVOTHROID) 175 MCG tablet Take 175 mcg by mouth daily before breakfast.   Yes Historical Provider, MD  metoprolol succinate (TOPROL-XL) 25 MG 24 hr tablet Take 1 tablet (25 mg total) by mouth daily. 09/18/15  Yes Percell Miller  Saguier, PA-C  Prenatal Vit-Fe Fumarate-FA (MULTIVITAMIN-PRENATAL) 27-0.8 MG TABS tablet Take 1 tablet by mouth daily at 12 noon. Takes for nail and hair growth   Yes Historical Provider, MD  ramipril (ALTACE) 2.5 MG capsule Take ONE (1)  capsule by mouth once daily 12/24/15  Yes Mackie Pai, PA-C  spironolactone (ALDACTONE) 25 MG tablet Take ONE (1) tablet by mouth once daily 09/18/15  Yes Edward Saguier, PA-C  torsemide (DEMADEX) 10 MG tablet Take 1 tablet (10 mg total) by mouth daily. 10/06/15  Yes Edward Saguier, PA-C  atorvastatin (LIPITOR) 10 MG tablet Take 1 tablet (10 mg total) by mouth daily. 08/28/14   Percell Miller Saguier, PA-C  doxycycline (VIBRA-TABS) 100 MG tablet Take 1 tablet (100 mg total) by mouth 2 (two) times daily. Can give caps or generic 02/10/16   Mackie Pai, PA-C  Insulin Pen Needle (B-D ULTRAFINE III SHORT PEN) 31G X 8 MM MISC Use for insulin pen up to five times daily E11.65 05/01/15   Historical Provider, MD  NOVOLOG 100 UNIT/ML injection Inject 40 Units into the skin 3 (three) times daily with meals.  10/22/15   Historical Provider, MD    Physical Exam: Vitals:    04/05/16 1700 04/05/16 1936 04/05/16 2051 04/06/16 0017  BP: 118/81 118/95 (!) 115/52 104/66  Pulse:  (!) 144 (!) 107 (!) 120  Resp: 20 20 20 20   Temp:   98.3 F (36.8 C) 99.6 F (37.6 C)  TempSrc:   Oral Oral  SpO2: 97% 98% 99% 97%  Weight:   105.8 kg (233 lb 4.8 oz)   Height:   5' 2"  (1.575 m)      General:  Appears calm and comfortable and is NAD Eyes:  EOMI, normal lids, iris ENT:  grossly normal hearing, lips & tongue, mmm Neck:  no LAD, masses or thyromegaly Cardiovascular:  Tachycardia, irregularly irregular, no m/r/g. No LE edema.  Respiratory:  CTA bilaterally, no w/r/r. Normal respiratory effort. Abdomen:  soft, ntnd, hyperactive BS Skin: R toes with blistering and erythema after application of topical medication; L toes are bluish in color and cool Musculoskeletal:  grossly normal tone BUE/BLE, good ROM, no bony abnormality Psychiatric:  grossly normal mood and affect, speech fluent and appropriate, AOx3 Neurologic:  CN 2-12 grossly intact, moves all extremities in coordinated fashion, sensation intact  Labs on Admission: I have personally reviewed following labs and imaging studies  CBC:  Recent Labs Lab 04/05/16 0923  WBC 5.7  HGB 16.6*  HCT 48.7*  MCV 97.6  PLT 92*   Basic Metabolic Panel:  Recent Labs Lab 04/05/16 0923  NA 138  K 4.7  CL 97*  CO2 29  GLUCOSE 335*  BUN 34*  CREATININE 0.93  CALCIUM 10.0   GFR: Estimated Creatinine Clearance: 67.1 mL/min (by C-G formula based on SCr of 0.93 mg/dL). Liver Function Tests:  Recent Labs Lab 04/05/16 0923  AST 85*  ALT 70*  ALKPHOS 155*  BILITOT 1.3*  PROT 8.9*  ALBUMIN 4.3    Recent Labs Lab 04/05/16 0923  LIPASE 49   No results for input(s): AMMONIA in the last 168 hours. Coagulation Profile: No results for input(s): INR, PROTIME in the last 168 hours. Cardiac Enzymes: No results for input(s): CKTOTAL, CKMB, CKMBINDEX, TROPONINI in the last 168 hours. BNP (last 3  results)  Recent Labs  02/10/16 1219  PROBNP 60.0   HbA1C: No results for input(s): HGBA1C in the last 72 hours. CBG:  Recent Labs Lab 04/05/16 [phone] 04/05/16  1448 04/05/16 1918 04/05/16 2156  GLUCAP 338* 312* 283* 240*   Lipid Profile: No results for input(s): CHOL, HDL, LDLCALC, TRIG, CHOLHDL, LDLDIRECT in the last 72 hours. Thyroid Function Tests: No results for input(s): TSH, T4TOTAL, FREET4, T3FREE, THYROIDAB in the last 72 hours. Anemia Panel: No results for input(s): VITAMINB12, FOLATE, FERRITIN, TIBC, IRON, RETICCTPCT in the last 72 hours. Urine analysis:    Component Value Date/Time   COLORURINE YELLOW 04/05/2016 [place] 04/05/2016 1203   LABSPEC 1.022 04/05/2016 1203   PHURINE 6.0 04/05/2016 1203   GLUCOSEU 250 (A) 04/05/2016 1203   HGBUR NEGATIVE 04/05/2016 [place] 04/05/2016 [place] 04/05/2016 1203   PROTEINUR NEGATIVE 04/05/2016 1203   NITRITE NEGATIVE 04/05/2016 1203   LEUKOCYTESUR TRACE (A) 04/05/2016 1203    Creatinine Clearance: Estimated Creatinine Clearance: 67.1 mL/min (by C-G formula based on SCr of 0.93 mg/dL).  Sepsis Labs: @LABRCNTIP (procalcitonin:4,lacticidven:4) ) Recent Results (from the past 240 hour(s))  Culture, blood (routine x 2)     Status: None (Preliminary result)   Collection Time: 04/05/16  5:27 PM  Result Value Ref Range Status   Specimen Description   Final    BLOOD LEFT HAND Performed at [facility], [address] [place], [place] [address]    Special Requests   Final    BOTTLES DRAWN AEROBIC AND ANAEROBIC 5CC RED TOP 4CC BLUE   Culture PENDING  Incomplete   Report Status PENDING  Incomplete  Culture, blood (routine x 2)     Status: None (Preliminary result)   Collection Time: 04/05/16  5:41 PM  Result Value Ref Range Status   Specimen Description BLOOD RIGHT HAND  Final   Special Requests   Final    IN PEDIATRIC BOTTLE 3CC Performed at [facility], [address] [place], [place] [address]    Culture PENDING  Incomplete   Report Status PENDING  Incomplete     Radiological Exams on Admission: US Abdomen Limited Ruq  Result Date: 04/05/2016 CLINICAL DATA:  66 year old female with right upper quadrant abdominal pain, nausea vomiting and diarrhea for 1 day. Hyperglycemia. Initial encounter. EXAM: US ABDOMEN LIMITED - RIGHT UPPER QUADRANT COMPARISON:  Right upper quadrant ultrasound 05/21/2014 and [facility] CT Abdomen and Pelvis 03/13/2010 FINDINGS: Gallbladder: Chronic cholelithiasis. Superimposed sludge. Today individual stones are estimated up to 1.9 cm. Gallbladder wall thickness remains normal at 2 mm. No sonographic Murphy sign elicited. No pericholecystic fluid. Common bile duct: Diameter: 2 mm, normal Liver: Chronically echogenic (image 36). Dense parenchyma somewhat difficult penetrate. No intrahepatic biliary ductal dilatation or discrete liver lesion identified. IMPRESSION: Chronic cholelithiasis and fatty liver disease. No evidence of acute cholecystitis or acute biliary obstruction. Electronically Signed   By: Genevie Ann M.D.   On: 04/05/2016 11:06    EKG: Independently reviewed.  Afib with rate 98; nonspecific ST changes with no evidence of acute ischemia, NSCSLT  Assessment/Plan Principal Problem:   Vomiting and diarrhea Active Problems:   Peripheral vascular disease (HCC)   Diabetes (HCC)   Atrial fibrillation (HCC)   Hypothyroidism   N/V/D -Likely viral gastroenteritis given acute onset and current presentation -Vomiting has improved, diarrhea is ongoing -AST/ALT 85/70, bili 1.3, AP 155 -Will check stool GI panel and C diff (recent antibiotics) -Blood, urine cultures pending -Lactate 3.16, 3.21 -Has not been rechecked in approximately 12 hours, will repeat now -[facility], currently on MIVF but if elevated  lactate with need additional bolus(es)  Afib -Not effectively rate  controlled at this time, but this is likely due to acute illness -Usual rate control is with low-dose Toprol XL -Will give prn Cardizem IV -If this is ineffective, will need to consider transfer to SDU for Cardizem drip -Anticoagulated with Eliquis for CHA2DS2-VASc score 4, will continue -Requests evaluation of pacemaker "calibration" either inpatient or outpatient, will defer to day team  DM -Glucose 283, 240 -Usus insulin pump, will continue with SSI  PVD -Right toes appear to have had topical reaction to medication -Will not give antibiotics for this, will use hydrocortisone cream 1% TID -Has apparent poor circulation chronically on left -Suggest outpatient cardiology consult  Hypothyroidism -Elevated TSH in 2/17 -No apparent free T4 from that time -No apparent repeat TSH since -Will order both TSH and free T4 now -Continue Synthroid at current dose for now  DVT prophylaxis: Eliquis Code Status: DNR - confirmed with patient Family Communication: None present Disposition Plan: Home once clinically improved Consults called: None  Admission status: It is my clinical opinion that referral for OBSERVATION is reasonable and necessary in this patient based on the above information provided. The aforementioned taken together are felt to place the patient at high risk for further clinical deterioration. However it is anticipated that the patient may be medically stable for discharge from the hospital within 24 to 48 hours.    Karmen Bongo MD [facility]  If 7PM-7AM, please contact night-coverage [email or web address] Password [email or web address]  04/06/2016, 1:34 AM

## 2016-04-05 NOTE — ED Notes (Signed)
Patient transported to Ultrasound 

## 2016-04-05 NOTE — ED Notes (Signed)
[facility] here for transport. 

## 2016-04-05 NOTE — ED Notes (Signed)
EDP made aware of diarrhea episodes.

## 2016-04-05 NOTE — ED Notes (Signed)
ED Provider at bedside. 

## 2016-04-05 NOTE — ED Provider Notes (Signed)
[facility] DEPT Provider Note   CSN: [id number] Arrival date & time: 04/05/16 0840     History    Chief Complaint  Patient presents with  . Emesis  . Hyperglycemia     HPI Alexis [name] is a 66 y.o. female.  66yo F w/ PMH including CHF, A fib on Eliquis, CAD s/p MI, COPD who p/w Nausea and vomiting. Patient states that at 3 AM this morning she began not feeling well and shortly afterwards began having nausea and vomiting. She has had one episode of diarrhea. She reports that her blood sugar was elevated in the 500s. She has an insulin pump. She denies any associated fevers, chest pain, shortness of breath, or abdominal pain. No sick contacts or recent travel. She has not taken her medications this morning but did take medications yesterday.  Of note, patient mentions 2 week hx of rash and pain on her R foot. She initially put topical pain cream on her foot but it seemed to be redder afterwards so she stopped. She saw her podiatrist who was not sure what problem was. She is scheduled for vascular US of leg due to problems with leg cramps.   Past Medical History:  Diagnosis Date  . Allergy   . Anxiety   . Arthritis   . Asthma   . Atrial fibrillation ([place])   . CHF (congestive heart failure) ([place])   . COPD (chronic obstructive pulmonary disease) ([place])   . Diabetes mellitus without complication ([place])   . Hyperlipidemia   . Hypertension   . Myocardial infarction   . Peripheral neuropathy ([place])   . Thyroid disease      Patient Active Problem List   Diagnosis Date Noted  . Hyperlipidemia 11/18/2015  . Gout 08/05/2014  . Cough 08/05/2014  . Diabetes ([place]) 07/12/2014  . Allergic rhinitis 07/12/2014  . Osteoarthritis 07/12/2014  . Asthma 07/12/2014  . CHF (congestive heart failure) ([place]) 07/12/2014  . CAD (coronary artery disease) 07/12/2014  . Atrial fibrillation ([place]) 07/12/2014  . HTN (hypertension) 07/12/2014  . Hypothyroidism 07/12/2014  . History of substance  abuse 07/12/2014  . Peripheral vascular disease, unspecified 04/02/2013  . Discoloration of skin 04/02/2013    Past Surgical History:  Procedure Laterality Date  . CORONARY ARTERY BYPASS GRAFT    . TOTAL KNEE ARTHROPLASTY      OB History    No data available        Home Medications    Prior to Admission medications   Medication Sig Start Date End Date Taking? Authorizing Provider  albuterol (PROVENTIL HFA;VENTOLIN HFA) 108 (90 Base) MCG/ACT inhaler Inhale 2 puffs into the lungs every 6 (six) hours as needed for wheezing or shortness of breath. 12/12/15  Yes Edward Saguier, PA-C  albuterol (PROVENTIL HFA;VENTOLIN HFA) 108 (90 Base) MCG/ACT inhaler Inhale 2 puffs into the lungs every 6 (six) hours as needed for wheezing or shortness of breath. 02/10/16  Yes Edward Saguier, PA-C  albuterol (PROVENTIL) (2.5 MG/3ML) 0.083% nebulizer solution Take 3 mLs (2.5 mg total) by nebulization every 6 (six) hours as needed for wheezing or shortness of breath. 02/10/16  Yes Edward Saguier, PA-C  allopurinol (ZYLOPRIM) 100 MG tablet Take ONE (1) tablet by mouth once daily 09/18/15  Yes Edward Saguier, PA-C  apixaban (ELIQUIS) 5 MG TABS tablet Take 1 tablet (5 mg total) by mouth 2 (two) times daily. 11/17/15  Yes Jettie Booze, MD  azelastine (ASTELIN) 0.1 % nasal spray Place 2 sprays into both nostrils  2 (two) times daily. Use in each nostril as directed 09/25/15  Yes Mackie Pai, PA-C  Blood Glucose Monitoring Suppl (GLUCOCOM BLOOD GLUCOSE MONITOR) DEVI Use to check blood sugars 6 times daily E11.65 05/01/15  Yes Historical Provider, MD  DULoxetine (CYMBALTA) 30 MG capsule Take 1 capsule (30 mg total) by mouth 2 (two) times daily. 02/25/16  Yes Edward Saguier, PA-C  fluticasone (FLONASE) 50 MCG/ACT nasal spray Place 1 spray into both nostrils daily. 09/16/15  Yes Edward Saguier, PA-C  fluticasone (FLOVENT HFA) 110 MCG/ACT inhaler Inhale 2 puffs into the lungs 2 (two) times daily. 12/16/15  Yes  Edward Saguier, PA-C  HYDROcodone-homatropine (HYCODAN) 5-1.5 MG/5ML syrup Take 5 mLs by mouth every 8 (eight) hours as needed for cough. 02/10/16  Yes Edward Saguier, PA-C  Insulin Disposable Pump (V-GO 40) KIT Inject 40 mg as directed 6 (six) times daily. 11/21/15  Yes Historical Provider, MD  levothyroxine (SYNTHROID, LEVOTHROID) 175 MCG tablet Take 175 mcg by mouth daily before breakfast.   Yes Historical Provider, MD  metoprolol succinate (TOPROL-XL) 25 MG 24 hr tablet Take 1 tablet (25 mg total) by mouth daily. 09/18/15  Yes Edward Saguier, PA-C  Prenatal Vit-Fe Fumarate-FA (MULTIVITAMIN-PRENATAL) 27-0.8 MG TABS tablet Take 1 tablet by mouth daily at 12 noon. Takes for nail and hair growth   Yes Historical Provider, MD  ramipril (ALTACE) 2.5 MG capsule Take ONE (1)  capsule by mouth once daily 12/24/15  Yes Mackie Pai, PA-C  spironolactone (ALDACTONE) 25 MG tablet Take ONE (1) tablet by mouth once daily 09/18/15  Yes Edward Saguier, PA-C  torsemide (DEMADEX) 10 MG tablet Take 1 tablet (10 mg total) by mouth daily. 10/06/15  Yes Edward Saguier, PA-C  atorvastatin (LIPITOR) 10 MG tablet Take 1 tablet (10 mg total) by mouth daily. 08/28/14   Percell Miller Saguier, PA-C  doxycycline (VIBRA-TABS) 100 MG tablet Take 1 tablet (100 mg total) by mouth 2 (two) times daily. Can give caps or generic 02/10/16   Mackie Pai, PA-C  Insulin Pen Needle (B-D ULTRAFINE III SHORT PEN) 31G X 8 MM MISC Use for insulin pen up to five times daily E11.65 05/01/15   Historical Provider, MD  NOVOLOG 100 UNIT/ML injection Inject 40 Units into the skin 3 (three) times daily with meals.  10/22/15   Historical Provider, MD      Family History  Problem Relation Age of Onset  . Diabetes Mother   . Hyperlipidemia Mother   . Diabetes Father   . Hyperlipidemia Father   . Heart disease Father     before age 58  . Heart attack Father   . Diabetes Brother   . Hyperlipidemia Brother   . Heart attack Brother      Social History   Substance Use Topics  . Smoking status: Former Smoker    Quit date: 03/01/1998  . Smokeless tobacco: Never Used  . 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     Allergies     Indomethacin; Pregabalin; Sulfa antibiotics; and Sulfamethoxazole    Review of Systems  10 Systems reviewed and are negative for acute change except as noted in the HPI.   Physical Exam Updated Vital Signs BP 119/82   Pulse 63   Temp 97.5 F (36.4 C) (Oral)   Resp 18   Ht 5' 2" (1.575 m)   Wt 200 lb (90.7 kg)   SpO2 95%   BMI 36.58 kg/m   Physical  Exam  Constitutional: She is oriented to person, place, and time. She appears well-developed and well-nourished. No distress.  Uncomfortable, holding emesis bag  HENT:  Head: Normocephalic and atraumatic.  Moist mucous membranes  Eyes: Conjunctivae are normal. Pupils are equal, round, and reactive to light.  Neck: Neck supple.  Cardiovascular: Normal heart sounds.  An irregularly irregular rhythm present. Tachycardia present.   No murmur heard. Pulmonary/Chest: Effort normal and breath sounds normal.  Abdominal: Soft. Bowel sounds are normal. She exhibits no distension. There is no tenderness.  Musculoskeletal: She exhibits no edema.  Neurological: She is alert and oriented to person, place, and time.  Fluent speech  Skin: Skin is warm and dry. Rash noted.  Peeling of skin on R toes and in interweb spaces w/ diffuse erythema, no drainage  Psychiatric: She has a normal mood and affect. Judgment normal.  Nursing note and vitals reviewed.     ED Treatments / Results  Labs (all labs ordered are listed, but only abnormal results are displayed) Labs Reviewed  COMPREHENSIVE METABOLIC PANEL - Abnormal; Notable for the following:       Result Value   Chloride 97 (*)    Glucose, Bld 335 (*)    BUN 34 (*)    Total Protein 8.9 (*)    AST 85 (*)    ALT 70 (*)    Alkaline Phosphatase 155 (*)    Total  Bilirubin 1.3 (*)    All other components within normal limits  CBC - Abnormal; Notable for the following:    Hemoglobin 16.6 (*)    HCT 48.7 (*)    Platelets 92 (*)    All other components within normal limits  URINALYSIS, ROUTINE W REFLEX MICROSCOPIC - Abnormal; Notable for the following:    Glucose, UA 250 (*)    Leukocytes, UA TRACE (*)    All other components within normal limits  URINALYSIS, MICROSCOPIC (REFLEX) - Abnormal; Notable for the following:    Bacteria, UA MANY (*)    Squamous Epithelial / LPF 6-30 (*)    All other components within normal limits  CBG MONITORING, ED - Abnormal; Notable for the following:    Glucose-Capillary 338 (*)    All other components within normal limits  I-STAT CG4 LACTIC ACID, ED - Abnormal; Notable for the following:    Lactic Acid, Venous 3.16 (*)    All other components within normal limits  I-STAT CG4 LACTIC ACID, ED - Abnormal; Notable for the following:    Lactic Acid, Venous 3.21 (*)    All other components within normal limits  CBG MONITORING, ED - Abnormal; Notable for the following:    Glucose-Capillary 312 (*)    All other components within normal limits  LIPASE, BLOOD     EKG  EKG Interpretation  Date/Time:  Monday April 05 2016 08:57:56 EST Ventricular Rate:  109 PR Interval:    QRS Duration: 81 QT Interval:  294 QTC Calculation: 396 R Axis:   91 Text Interpretation:  Second degree AV block, Mobitz II Multiple premature complexes, vent & supraven Right axis deviation Repol abnrm suggests ischemia, diffuse leads A fib similar to previous Confirmed by LITTLE MD, RACHEL [phone]) on 04/05/2016 9:11:37 AM         Radiology US Abdomen Limited Ruq  Result Date: 04/05/2016 CLINICAL DATA:  66 year old female with right upper quadrant abdominal pain, nausea vomiting and diarrhea for 1 day. Hyperglycemia. Initial encounter. EXAM: US ABDOMEN LIMITED - RIGHT UPPER QUADRANT COMPARISON:  Right upper quadrant ultrasound  05/21/2014 and [facility] CT Abdomen and Pelvis 03/13/2010 FINDINGS: Gallbladder: Chronic cholelithiasis. Superimposed sludge. Today individual stones are estimated up to 1.9 cm. Gallbladder wall thickness remains normal at 2 mm. No sonographic Murphy sign elicited. No pericholecystic fluid. Common bile duct: Diameter: 2 mm, normal Liver: Chronically echogenic (image 36). Dense parenchyma somewhat difficult penetrate. No intrahepatic biliary ductal dilatation or discrete liver lesion identified. IMPRESSION: Chronic cholelithiasis and fatty liver disease. No evidence of acute cholecystitis or acute biliary obstruction. Electronically Signed   By: Genevie Ann M.D.   On: 04/05/2016 11:06    Procedures Procedures (including critical care time) Procedures  Medications Ordered in ED  Medications  0.9 %  sodium chloride infusion (not administered)  ondansetron (ZOFRAN) injection 4 mg (4 mg Intravenous Given 04/05/16 0930)  sodium chloride 0.9 % bolus 1,000 mL (0 mLs Intravenous Stopped 04/05/16 1130)  sodium chloride 0.9 % bolus 1,000 mL (0 mLs Intravenous Stopped 04/05/16 1352)  doxycycline (VIBRA-TABS) tablet 100 mg (100 mg Oral Given 04/05/16 1226)  metoprolol (LOPRESSOR) tablet 25 mg (25 mg Oral Given 04/05/16 1226)  ondansetron (ZOFRAN) injection 4 mg (4 mg Intravenous Given 04/05/16 1352)  diltiazem (CARDIZEM) injection 10 mg (10 mg Intravenous Given 04/05/16 1453)  prochlorperazine (COMPAZINE) injection 10 mg (10 mg Intravenous Given 04/05/16 1554)  insulin aspart (novoLOG) injection 5 Units (5 Units Subcutaneous Given 04/05/16 1553)     Initial Impression / Assessment and Plan / ED Course  I have reviewed the triage vital signs and the nursing notes.  Pertinent labs & imaging results that were available during my care of the patient were reviewed by me and considered in my medical decision making (see chart for details).     Pt w/ nausea and vomiting beginning overnight and one episode of  diarrhea. She was uncomfortable but nontoxic on exam. Vital signs showed variable heart rate, A. fib on the monitor, 80s-120. BP 96/54. No abdominal tenderness or distention. Initial lactate 3.16, blood glucose 335 with normal creatinine and normal anion gap. LFTs mildly elevated at AST 85, ALT 70, total bili 1.3. I did obtain right upper quadrant ultrasound given these abnormal findings in the patient's vomiting. RUQ US shows Chronic cholelithiasis with no evidence of acute cholecystitis or obstruction.  Pt incidentally noted 2 week rash and pain on R toes. Exam appears c/w athletes foot given peeling skin and interweb space rash but may be superinfected w/ bacteria given pain. Will treat w/ topical antifungal as well as doxycycline.   Patient has required multiple doses of antibiotics due to ongoing vomiting. Repeat lactate after IV fluids was persistent at 3.21. I considered sepsis but the remainder of her VS are normal and I suspect her ongoing lactate is due to continued fluid losses. Her repeat blood glucose was 312 and she later informed the nurse that her insulin pump had run out. Gave him 5 units NovoLog. I have given her a small bolus of diltiazem to control A fib w/ RVR. She continues to have profuse diarrhea here and I am concerned about her ongoing fluid losses with persistent lactic acidosis and atrial fibrillation w/ RVR. Given her continued symptoms and comorbidities, recommended admission. Discussed transfer with hospitalist, Dr. Wyline Copas, appreciate assistance. Pt will be transferred to [facility] for admission.  Final Clinical Impressions(s) / ED Diagnoses   Final diagnoses:  RUQ pain  Hyperglycemia  Nausea vomiting and diarrhea  Atrial fibrillation with rapid ventricular response (HCC)  New Prescriptions   No medications on file       Sharlett Iles, MD 04/05/16 [phone]

## 2016-04-05 NOTE — ED Triage Notes (Signed)
Pt reports not feeling well since 0300. Reports that she took her sugar at home and it was 500s, then nausea and vomiting began shortly after. Now also with diarrhea at triage. Reports hx of afib taking eloquis. Denies chest pain or fevers.

## 2016-04-06 DIAGNOSIS — R1111 Vomiting without nausea: Secondary | ICD-10-CM

## 2016-04-06 DIAGNOSIS — R1110 Vomiting, unspecified: Secondary | ICD-10-CM | POA: Diagnosis not present

## 2016-04-06 DIAGNOSIS — D696 Thrombocytopenia, unspecified: Secondary | ICD-10-CM

## 2016-04-06 DIAGNOSIS — E038 Other specified hypothyroidism: Secondary | ICD-10-CM

## 2016-04-06 DIAGNOSIS — I4891 Unspecified atrial fibrillation: Secondary | ICD-10-CM | POA: Diagnosis not present

## 2016-04-06 DIAGNOSIS — R197 Diarrhea, unspecified: Secondary | ICD-10-CM

## 2016-04-06 DIAGNOSIS — I481 Persistent atrial fibrillation: Secondary | ICD-10-CM | POA: Diagnosis not present

## 2016-04-06 LAB — CBC
HCT: 38.4 % (ref 36.0–46.0)
Hemoglobin: 12.9 g/dL (ref 12.0–15.0)
MCH: 33.2 pg (ref 26.0–34.0)
MCHC: 33.6 g/dL (ref 30.0–36.0)
MCV: 98.7 fL (ref 78.0–100.0)
PLATELETS: 74 10*3/uL — AB (ref 150–400)
RBC: 3.89 MIL/uL (ref 3.87–5.11)
RDW: 14.4 % (ref 11.5–15.5)
WBC: 3.6 10*3/uL — AB (ref 4.0–10.5)

## 2016-04-06 LAB — GLUCOSE, CAPILLARY
Glucose-Capillary: 225 mg/dL — ABNORMAL HIGH (ref 65–99)
Glucose-Capillary: 253 mg/dL — ABNORMAL HIGH (ref 65–99)
Glucose-Capillary: 198 mg/dL — ABNORMAL HIGH (ref 65–99)
Glucose-Capillary: 219 mg/dL — ABNORMAL HIGH (ref 65–99)

## 2016-04-06 LAB — GASTROINTESTINAL PANEL BY PCR, STOOL (REPLACES STOOL CULTURE)
ADENOVIRUS F40/41: NOT DETECTED
Astrovirus: NOT DETECTED
CAMPYLOBACTER SPECIES: NOT DETECTED
Cryptosporidium: NOT DETECTED
Cyclospora cayetanensis: NOT DETECTED
ENTEROAGGREGATIVE E COLI (EAEC): NOT DETECTED
ENTEROPATHOGENIC E COLI (EPEC): NOT DETECTED
Entamoeba histolytica: NOT DETECTED
Enterotoxigenic E coli (ETEC): NOT DETECTED
GIARDIA LAMBLIA: NOT DETECTED
NOROVIRUS GI/GII: DETECTED — AB
PLESIMONAS SHIGELLOIDES: NOT DETECTED
ROTAVIRUS A: NOT DETECTED
SALMONELLA SPECIES: NOT DETECTED
SHIGELLA/ENTEROINVASIVE E COLI (EIEC): NOT DETECTED
Sapovirus (I, II, IV, and V): NOT DETECTED
Shiga like toxin producing E coli (STEC): NOT DETECTED
Vibrio cholerae: NOT DETECTED
Vibrio species: NOT DETECTED
Yersinia enterocolitica: NOT DETECTED

## 2016-04-06 LAB — BASIC METABOLIC PANEL
Anion gap: 11 (ref 5–15)
BUN: 30 mg/dL — AB (ref 6–20)
CALCIUM: 8.3 mg/dL — AB (ref 8.9–10.3)
CHLORIDE: 102 mmol/L (ref 101–111)
CO2: 22 mmol/L (ref 22–32)
CREATININE: 0.9 mg/dL (ref 0.44–1.00)
Glucose, Bld: 213 mg/dL — ABNORMAL HIGH (ref 65–99)
Potassium: 4 mmol/L (ref 3.5–5.1)
SODIUM: 135 mmol/L (ref 135–145)

## 2016-04-06 LAB — LACTIC ACID, PLASMA
Lactic Acid, Venous: 2 mmol/L (ref 0.5–1.9)
Lactic Acid, Venous: 1.7 mmol/L (ref 0.5–1.9)

## 2016-04-06 LAB — T4, FREE: Free T4: 0.79 ng/dL (ref 0.61–1.12)

## 2016-04-06 LAB — BRAIN NATRIURETIC PEPTIDE: B Natriuretic Peptide: 75.9 pg/mL (ref 0.0–100.0)

## 2016-04-06 LAB — C DIFFICILE QUICK SCREEN W PCR REFLEX
C DIFFICILE (CDIFF) INTERP: NOT DETECTED
C DIFFICILE (CDIFF) TOXIN: NEGATIVE
C DIFFICLE (CDIFF) ANTIGEN: NEGATIVE

## 2016-04-06 LAB — TSH: TSH: 8.468 u[IU]/mL — ABNORMAL HIGH (ref 0.350–4.500)

## 2016-04-06 MED ORDER — SODIUM CHLORIDE 0.9 % IV SOLN
INTRAVENOUS | Status: DC
  Administered 2016-04-06: 17:00:00 via INTRAVENOUS

## 2016-04-06 MED ORDER — HYDROCORTISONE 1 % EX CREA
TOPICAL_CREAM | Freq: Three times a day (TID) | CUTANEOUS | Status: DC
  Administered 2016-04-06 – 2016-04-08 (×8): via TOPICAL
  Filled 2016-04-06: qty 28

## 2016-04-06 MED ORDER — METOPROLOL TARTRATE 25 MG PO TABS
25.0000 mg | ORAL_TABLET | Freq: Two times a day (BID) | ORAL | Status: DC
  Administered 2016-04-06 – 2016-04-08 (×4): 25 mg via ORAL
  Filled 2016-04-06 (×4): qty 1

## 2016-04-06 MED ORDER — DULOXETINE HCL 30 MG PO CPEP
30.0000 mg | ORAL_CAPSULE | Freq: Every day | ORAL | Status: DC
  Administered 2016-04-07: 30 mg via ORAL
  Filled 2016-04-06: qty 1

## 2016-04-06 MED ORDER — DILTIAZEM HCL 25 MG/5ML IV SOLN
10.0000 mg | INTRAVENOUS | Status: DC | PRN
  Filled 2016-04-06 (×2): qty 5

## 2016-04-06 NOTE — Progress Notes (Signed)
Patient ID: Alexis [name], female   DOB: 1950/10/15, 66 y.o.   MRN: [id number]    PROGRESS NOTE    Alexis [name]  GUR:[id number] DOB: January 08, 1951 DOA: 04/05/2016  PCP: Elise Benne   Brief Narrative:  66 y.o. female with medical history significant of CHF, afib on anticoagulation, CAD, and COPD presenting with n/v/d.  No fever.  No sick contacts. In ED, pt noted to be in a-fib with RVR, TRH asked to admit for further evaluation.   Assessment & Plan:   N/V/D - Likely viral gastroenteritis given acute onset and current presentation - no vomiting or diarrhea since admission and pt reports tolerating diet well  - C. Diff negative, GI stool panel pending  - stop IVF as pt more swollen, LE edema and some upper extremity edema - continue regular diet   Transaminitis - from acute illness - abd [place] with no signs of acute cholecystitis  - CMET in AM  Thrombocytopenia  - review of records indicated chronic thrombocytopenia with Plt in 70 -90's - no signs of bleeding - CBC in AM  Afib with RVR, on Eliquis  - Not effectively rate controlled at this time, but this is likely due to acute illness - cardiology consulted for evaluation of pacemaker - keep on tele for now   DM - using insulin pump at home   PVD - Right toes appear to have had topical reaction to medication - Will not give antibiotics for this, use hydrocortisone cream 1% TID - Has apparent poor circulation chronically on left  Hypothyroidism - Elevated TSH in 2/17 - check TSH and free T4 now - Continue Synthroid at current dose for now  Morbid obesity  - Body mass index is 42.74 kg/m.  DVT prophylaxis: Eliquis  Code Status: DNR Family Communication: Patient at bedside  Disposition Plan: Home in AM  Consultants:   Cardiology   Procedures:   None  Antimicrobials:   None  Subjective: Pt reports feeling much better this AM, no chest pain and no dyspnea.   Objective: Vitals:   04/06/16 0907 04/06/16 0911 04/06/16 1212 04/06/16 1640  BP: (!) 148/125 121/67 109/60 (!) 108/40  Pulse: 95  88 74  Resp: 18  18 18   Temp: 98 F (36.7 C)  98 F (36.7 C) 98.2 F (36.8 C)  TempSrc: Oral  Oral Oral  SpO2: 100%  97% 99%  Weight:      Height:        Intake/Output Summary (Last 24 hours) at 04/06/16 1808 Last data filed at 04/06/16 1719  Gross per 24 hour  Intake          3452.83 ml  Output             2225 ml  Net          1227.83 ml   Filed Weights   04/05/16 0847 04/05/16 2051 04/06/16 0629  Weight: 90.7 kg (200 lb) 105.8 kg (233 lb 4.8 oz) 106 kg (233 lb 11.2 oz)   Examination:  General exam: Appears calm and comfortable  Respiratory system: Clear to auscultation. Respiratory effort normal. Cardiovascular system: IRRR. No JVD, murmurs, rubs, gallops or clicks. LE edema +1  Gastrointestinal system: Abdomen is nondistended, soft and nontender. No organomegaly or masses felt.  Central nervous system: Alert and oriented. No focal neurological deficits.  Data Reviewed: I have personally reviewed following labs and imaging studies  CBC:  Recent Labs Lab 04/05/16 0923 04/06/16 0157  WBC 5.7  3.6*  HGB 16.6* 12.9  HCT 48.7* 38.4  MCV 97.6 98.7  PLT 92* 74*   Basic Metabolic Panel:  Recent Labs Lab 04/05/16 0923 04/06/16 0157  NA 138 135  K 4.7 4.0  CL 97* 102  CO2 29 22  GLUCOSE 335* 213*  BUN 34* 30*  CREATININE 0.93 0.90  CALCIUM 10.0 8.3*   Liver Function Tests:  Recent Labs Lab 04/05/16 0923  AST 85*  ALT 70*  ALKPHOS 155*  BILITOT 1.3*  PROT 8.9*  ALBUMIN 4.3    Recent Labs Lab 04/05/16 0923  LIPASE 49   BNP (last 3 results)  Recent Labs  02/10/16 1219  PROBNP 60.0   CBG:  Recent Labs Lab 04/05/16 1918 04/05/16 2156 04/06/16 0751 04/06/16 1125 04/06/16 1637  GLUCAP 283* 240* 225* 253* 198*   Urine analysis:    Component Value Date/Time   COLORURINE YELLOW 04/05/2016 [place] 04/05/2016  1203   LABSPEC 1.022 04/05/2016 1203   PHURINE 6.0 04/05/2016 1203   GLUCOSEU 250 (A) 04/05/2016 1203   HGBUR NEGATIVE 04/05/2016 [place] 04/05/2016 [place] 04/05/2016 1203   PROTEINUR NEGATIVE 04/05/2016 1203   NITRITE NEGATIVE 04/05/2016 1203   LEUKOCYTESUR TRACE (A) 04/05/2016 1203    Recent Results (from the past 240 hour(s))  Culture, blood (routine x 2)     Status: None (Preliminary result)   Collection Time: 04/05/16  5:27 PM  Result Value Ref Range Status   Specimen Description BLOOD LEFT HAND  Final   Special Requests   Final    BOTTLES DRAWN AEROBIC AND ANAEROBIC 5CC RED TOP 4CC BLUE   Culture   Final    NO GROWTH < 24 HOURS Performed at [facility], [place] [address], [place], [place] [address]    Report Status PENDING  Incomplete  Culture, blood (routine x 2)     Status: None (Preliminary result)   Collection Time: 04/05/16  5:41 PM  Result Value Ref Range Status   Specimen Description BLOOD RIGHT HAND  Final   Special Requests IN PEDIATRIC BOTTLE 3CC  Final   Culture   Final    NO GROWTH < 24 HOURS Performed at [facility], [place] [address], [place], [place] [address]    Report Status PENDING  Incomplete  C difficile quick scan w PCR reflex     Status: None   Collection Time: 04/05/16  9:44 PM  Result Value Ref Range Status   C Diff antigen NEGATIVE NEGATIVE Final   C Diff toxin NEGATIVE NEGATIVE Final   C Diff interpretation No C. difficile detected.  Final    Radiology Studies: US Abdomen Limited Ruq  Result Date: 04/05/2016 CLINICAL DATA:  66 year old female with right upper quadrant abdominal pain, nausea vomiting and diarrhea for 1 day. Hyperglycemia. Initial encounter. EXAM: US ABDOMEN LIMITED - RIGHT UPPER QUADRANT COMPARISON:  Right upper quadrant ultrasound 05/21/2014 and [facility] CT Abdomen and Pelvis 03/13/2010 FINDINGS: Gallbladder: Chronic cholelithiasis. Superimposed sludge. Today  individual stones are estimated up to 1.9 cm. Gallbladder wall thickness remains normal at 2 mm. No sonographic Murphy sign elicited. No pericholecystic fluid. Common bile duct: Diameter: 2 mm, normal Liver: Chronically echogenic (image 36). Dense parenchyma somewhat difficult penetrate. No intrahepatic biliary ductal dilatation or discrete liver lesion identified. IMPRESSION: Chronic cholelithiasis and fatty liver disease. No evidence of acute cholecystitis or acute biliary obstruction. Electronically Signed   By: Lemmie Evens  [place] Crane M.D.   On: 04/05/2016 11:06      Scheduled Meds: . allopurinol  100 mg Oral Daily  . apixaban  5 mg Oral BID  . atorvastatin  10 mg Oral Daily  . azelastine  2 spray Each Nare BID  . budesonide  0.25 mg Nebulization BID  . [START ON 04/07/2016] DULoxetine  30 mg Oral QHS  . fluticasone  1 spray Each Nare Daily  . hydrocortisone cream   Topical TID  . insulin aspart  0-15 Units Subcutaneous TID WC  . insulin aspart  0-5 Units Subcutaneous QHS  . levothyroxine  175 mcg Oral QAC breakfast  . metoprolol tartrate  25 mg Oral BID  . ramipril  2.5 mg Oral Daily  . sodium chloride flush  3 mL Intravenous Q12H   Continuous Infusions: . sodium chloride 50 mL/hr at 04/06/16 1656  . lactated ringers Stopped (04/06/16 1656)     LOS: 0 days    Time spent: 20 minutes    Faye Ramsay, MD [facility] Pager [phone]  If 7PM-7AM, please contact night-coverage [email or web address] Password [email or web address] 04/06/2016, 6:08 PM

## 2016-04-06 NOTE — Care Management Obs Status (Addendum)
[facility] NOTIFICATION   Patient Details  Name: Alexis [name] MRN: [id number] Date of Birth: Apr 13, 1950   Medicare Observation Status Notification Given:  Yes Enteric precaution, explained notice. Provided pt with copy.   Erenest Rasher, RN 04/06/2016, 7:00 PM

## 2016-04-06 NOTE — Progress Notes (Signed)
Patient positive for Norovirus. MD notified. Patient is in Enteric precaution,

## 2016-04-06 NOTE — Progress Notes (Signed)
Inpatient Diabetes Program Recommendations  AACE/ADA: New Consensus Statement on Inpatient Glycemic Control (2015)  Target Ranges:  Prepandial:   less than 140 mg/dL      Peak postprandial:   less than 180 mg/dL (1-2 hours)      Critically ill patients:  140 - 180 mg/dL   Lab Results  Component Value Date   GLUCAP 225 (H) 04/06/2016   HGBA1C 9.0 04/03/2015   Results for KADEISHA, BETSCH (MRN [id number]) as of 04/06/2016 09:55  Ref. Range 04/05/2016 09:03 04/05/2016 14:48 04/05/2016 19:18 04/05/2016 21:56 04/06/2016 07:51  Glucose-Capillary Latest Ref Range: 65 - 99 mg/dL 338 (H) 312 (H) 283 (H) 240 (H) 225 (H)   Review of Glycemic Control  Diabetes history:     DM2, on Insulin Pump V-Go, Obesity Outpatient Diabetes medications:     V-Go 40 units of basal and 6 pumps before each meal (equals 12 units meal coverage) Current orders for Inpatient glycemic control:     Moderate correction scale Novolog 0-15 units TIDAC and 0-5 units QHS  Inpatient Diabetes Program Recommendations:      Was receiving 40 units of basal through V-Go pump.     Please consider Lantus 35 units daily starting now.     Was receiving 12 units meal coverage through V-Go pump.     Please consider Meal coverage of Novolog 6 units TIDAC if patient eats > 50% of meal.  Text page sent to MD.  Thank you,  Windy Carina, RN, MSN Diabetes Coordinator Inpatient Diabetes Program [phone] (Team Pager)

## 2016-04-06 NOTE — Progress Notes (Signed)
New pt admission from ED. Pt brought to the floor in stable condition. Vitals taken. Initial Assessment done. All immediate pertinent needs to patient addressed. Patient Guide given to patient. Important safety instructions relating to hospitalization reviewed with patient. Patient verbalized understanding. Will continue to monitor pt.

## 2016-04-06 NOTE — Consult Note (Signed)
Cardiology Consult    Patient ID: SIDRAH Alexis [name] MRN: [id number], DOB/AGE: 1950-10-09   Admit date: 04/05/2016 Date of Consult: 04/06/2016  Primary Physician: Mackie Pai, PA-C Primary Cardiologist: Dr. Irish Lack  Requesting Provider: Dr. Doyle Askew Reason for Consultation: AF  Patient Profile    66 yo female with PMH with of Afib, COPD, IDDM, HTN, HL, systolic HF who presented with n/v/d who developed AF RVR.   Past Medical History   Past Medical History:  Diagnosis Date  . Allergy   . Anxiety   . Arthritis   . Asthma   . Atrial fibrillation ([place])   . CHF (congestive heart failure) ([place])   . COPD (chronic obstructive pulmonary disease) ([place])   . Diabetes mellitus without complication ([place])   . Hyperlipidemia   . Hypertension   . Myocardial infarction    several  . Peripheral neuropathy ([place])   . Thyroid disease     Past Surgical History:  Procedure Laterality Date  . RADIOFREQUENCY ABLATION    . TOTAL KNEE ARTHROPLASTY      Allergies  Allergies  Allergen Reactions  . Indomethacin Other (See Comments)     Renal Insufficiency  . Pregabalin Other (See Comments)    Other reaction(s): DIZZINESS   . Sulfa Antibiotics Hives  . Sulfamethoxazole Hives    History of Present Illness    Alexis [name] is a 66 yo with PMH of Afib, COPD, IDDM, HTN, HL, systolic HF. She was original dx with Afib in 2000, noted to have been drinking heavily at the time. Reports have had an ablation and PPM placed in 2001. Was then followed by Dr. Minna Merritts in [place], but she reestablished with Dr. Irish Lack after her insurance was no longer accepted. Placed on Eliquis for [place]. Reports having had cath several times without CAD per her report.   Last Echo 9/17 showed low normal EF, mildly dilated LA.   States she was admitted back in 12/17 with URI and placed on antibiotics for period of time. Has not really back to her baseline since that time. States yesterday morning she developed nausea.  Felt that her CBG may have been low at that time. She got up and then began vomiting, then developed diarrhea. Reports she had multiple episodes of vomiting/diarrhea and began to feel weak. Did have some dyspnea, but no orthopnea, or PND. States she has noticed an increase in her weight over the past week, with some LE edema.   On admission labs showed stable electrolytes, Lactic Acid 3.16, Hgb 16.6, AST 85, ALT 70, UA neg. Abd [place] noted no acute cholecystitis or biliary obstruction. EKG showed AF with intermittent V pacing with rate 109. TSH elevated at 8.4, but T4 normal. She was given a dose of IV Dilt for better rate control without much change. She was admitted for further work up.   Inpatient Medications    . allopurinol  100 mg Oral Daily  . apixaban  5 mg Oral BID  . atorvastatin  10 mg Oral Daily  . azelastine  2 spray Each Nare BID  . budesonide  0.25 mg Nebulization BID  . [START ON 04/07/2016] DULoxetine  30 mg Oral QHS  . fluticasone  1 spray Each Nare Daily  . hydrocortisone cream   Topical TID  . insulin aspart  0-15 Units Subcutaneous TID WC  . insulin aspart  0-5 Units Subcutaneous QHS  . levothyroxine  175 mcg Oral QAC breakfast  . metoprolol succinate  25 mg Oral  Daily  . ramipril  2.5 mg Oral Daily  . sodium chloride flush  3 mL Intravenous Q12H    Family History    Family History  Problem Relation Age of Onset  . Diabetes Mother   . Hyperlipidemia Mother   . Diabetes Father   . Hyperlipidemia Father   . Heart disease Father     before age 86  . Heart attack Father   . Diabetes Brother   . Hyperlipidemia Brother   . Heart attack Brother     Social History    Social History   Social History  . Marital status: Married    Spouse name: N/A  . Number of children: N/A  . Years of education: N/A   Occupational History  . retired    Social History Main Topics  . Smoking status: Former Smoker    Quit date: 03/01/1998  . Smokeless tobacco: Never Used  .  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  . Drug use: No     Comment: remote use  . Sexual activity: Not on file   Other Topics Concern  . Not on file   Social History Narrative  . No narrative on file     Review of Systems    General:  No chills, fever, night sweats ++ weight changes.  Cardiovascular:  See HPI Dermatological: No rash, lesions/masses Respiratory: No cough, dyspnea Urologic: No hematuria, dysuria Abdominal:   See HPI Neurologic:  No visual changes, wkns, changes in mental status. All other systems reviewed and are otherwise negative except as noted above.  Physical Exam    Blood pressure 109/60, pulse 88, temperature 98 F (36.7 C), temperature source Oral, resp. rate 18, height 5' 2"  (1.575 m), weight 233 lb 11.2 oz (106 kg), SpO2 97 %.  General: Pleasant older obese WF, NAD Psych: Normal affect. Neuro: Alert and oriented X 3. Moves all extremities spontaneously. HEENT: Normal  Neck: Supple without bruits or JVD. Lungs:  Resp regular and unlabored, CTA. Heart: Irreg Irreg no s3, s4, or murmurs. Abdomen: Soft, non-tender, non-distended, BS + x 4.  Extremities: No clubbing, cyanosis or edema. Both feet cool to touch, diminished pulses bilaterally, 1+? On the left. Peeling rash to right toes.   Labs    Troponin (Point of Care Test) No results for input(s): TROPIPOC in the last 72 hours. No results for input(s): CKTOTAL, CKMB, TROPONINI in the last 72 hours. Lab Results  Component Value Date   WBC 3.6 (L) 04/06/2016   HGB 12.9 04/06/2016   HCT 38.4 04/06/2016   MCV 98.7 04/06/2016   PLT 74 (L) 04/06/2016    Recent Labs Lab 04/05/16 0923 04/06/16 0157  NA 138 135  K 4.7 4.0  CL 97* 102  CO2 29 22  BUN 34* 30*  CREATININE 0.93 0.90  CALCIUM 10.0 8.3*  PROT 8.9*  --   BILITOT 1.3*  --   ALKPHOS 155*  --   ALT 70*  --   AST 85*  --   GLUCOSE 335* 213*   Lab Results  Component Value Date   CHOL  184 04/10/2015   HDL 33.10 (L) 04/10/2015   TRIG 254.0 (H) 04/10/2015   No results found for: [facility]   Radiology Studies    US Abdomen Limited Ruq  Result Date: 04/05/2016 CLINICAL DATA:  66 year old female with right upper quadrant abdominal pain, nausea vomiting and diarrhea for 1 day. Hyperglycemia.  Initial encounter. EXAM: US ABDOMEN LIMITED - RIGHT UPPER QUADRANT COMPARISON:  Right upper quadrant ultrasound 05/21/2014 and [facility] CT Abdomen and Pelvis 03/13/2010 FINDINGS: Gallbladder: Chronic cholelithiasis. Superimposed sludge. Today individual stones are estimated up to 1.9 cm. Gallbladder wall thickness remains normal at 2 mm. No sonographic Murphy sign elicited. No pericholecystic fluid. Common bile duct: Diameter: 2 mm, normal Liver: Chronically echogenic (image 36). Dense parenchyma somewhat difficult penetrate. No intrahepatic biliary ductal dilatation or discrete liver lesion identified. IMPRESSION: Chronic cholelithiasis and fatty liver disease. No evidence of acute cholecystitis or acute biliary obstruction. Electronically Signed   By: Genevie Ann M.D.   On: 04/05/2016 11:06    ECG & Cardiac Imaging    EKG: AF intermittent Vpacing  Echo: 9/17  Study Conclusions  - Left ventricle: The cavity size was normal. Wall thickness was   normal. Systolic function was normal. The estimated ejection   fraction was in the range of 50% to 55%. - Left atrium: The atrium was mildly dilated. - Right ventricle: The cavity size was mildly dilated. - Atrial septum: No defect or patent foramen ovale was identified. - Impressions: Overall poor image quality  Impressions:  - Overall poor image quality  Assessment & Plan    66 yo female with PMH with of Afib, COPD, IDDM, HTN, HL, systolic HF who presented with n/v/d who developed AF RVR.  1. AF RVR: First dx in 2000, on Toprol XL and Eliquis for [place]. Presented with GI illness and developed AF RVR with rates in the  120-160s. Given a dose of IV Dilt without much improvement. Rates were higher this morning, but somewhat improved this afternoon -- Will changes Toprol XL to Metoprolol Tartrate 56m BID for better rate control -- EKG shows what looks like Vpacing but pacer spikes seem out of rhythm with QRS. Will ask medtronic to interrogate device.  -- continue Eliquis for [place] This patients CHA2DS2-VASc Score and unadjusted Ischemic Stroke Rate (% per year) is equal to 4.8 % stroke rate/year from a score of 4 CHF, HTN, DM, Female  2. N/V/D: Lactic acid was elevated but now trending down. LFTs were elevated but [place]abd was negative. Likely viral illness. Improvement in symptoms today. Primary following.  3. Systolic HF: Last echo showed low normal EF. States she has noticed weight gain in the past week. Takes torsemide, but held on admission. Has been given IV fluids in relation to GI illness.  -- check BNP  4. HTN: Stable, recommendations as above.   5. PVD: Noted diminished pulses in LE bilaterally. Has hx of smoking. Followed by podiatry. States she's had dopplers in the past but no interventions. Consider LE dopplers.   6. IDDM: Cbgs have been elevated here, was wearing insulin pump in the outpatient setting.   7. Hypothyroidism: TSH elevated, but T4 normal. On synthroid.     SBarnet Pall NP-C Pager [phone]/07/2016, 1:43 PM    Patient seen and examined. Agree with assessment and plan. Ms Alexis Sporreris a 89-year-old Caucasian female who is now followed by Dr. VIrish Lackfor cardiology care.  She previously had been followed and [place].  She is status .  She has a history of atrial fibrillation and is status post ablation.  She underwent permanent pacemaker initial insertion in 2001 and states that she is on her third generator.  She's not had her pacemaker interrogated in several years.  Most recently she has been on eliquis for oral anticoagulation  therapy.  An echo Doppler  study in September 2017 showed an EF of 50-55% with mild left atrial enlargement.  She recently developed nausea, vomiting and diarrhea leading to remission.  She was found to have atrial fibrillation with rapid ventricular response with heart rate in the 130s yesterday.  Her ECG has shown atrial fibrillation and appears that there is inappropriate sensing.  He denies any chest tightness.  She denies any bleeding.  Laboratory revealed thrombocytopenia with a platelet count of 74,000.  LFTs are also increased with an ALT of 70, AST of 85 and alkaline phosphatase of 155.  Abdominal ultrasound revealed chronic cholelithiasis and fatty liver disease without evidence for acute cholecystitis or acute biliary obstruction.  Her exam is notable for blood pressure 110/64.  Pulse is irregular and now in the 80s.  HEENT is unremarkable.  She did not have JVD.  Her lungs were relatively clear.  Rhythm was irregularly irregular with rate in the 80s.  There was central opacity.  She had positive bowel sounds.  There was no significant edema.  Neurologic exam is grossly nonfocal.  TSH is mildly elevated but T4 is normal on Synthroid at 175 g daily.  At present, recommend initially changing from long-acting metoprolol, succinate to metoprolol, tartrate to allow for initial improved control with her ventricular rate.  We will contact the pacemaker rep for interrogation of her pacemaker with possible need for reprogramming.  Close follow-up of platelet function is necessary since she is on anticoagulation.  She is diabetic on an insulin drip and presented with glucose elevation in the setting of her GI distress.  We will follow patient with you.  Troy Sine, MD, [facility] 04/06/2016 3:38 PM

## 2016-04-06 NOTE — Progress Notes (Signed)
CRITICAL VALUE ALERT  Critical value received: Lactic acid, 2.6  Date of notification:  04/06/2016  Time of notification:  2:50 am  Critical value read back:Yes.    Nurse who received alert:  Palma Holter, RN  MD notified (1st page): Family medicine  Time of first page:  2.55am  Waiting for response

## 2016-04-06 NOTE — Progress Notes (Signed)
Pt slept well during the night, Vitals stable, no any sign of SOB and distress noted, IV drip is continue @ 125cc/hr, C-diff came negative but GI panel is due, pt has just two episode of diarrhea overnight and no any complain of nausea and vomiting, and pain, will continue to monitor the patient.

## 2016-04-07 ENCOUNTER — Observation Stay (HOSPITAL_COMMUNITY): Payer: Medicare HMO

## 2016-04-07 DIAGNOSIS — I481 Persistent atrial fibrillation: Secondary | ICD-10-CM

## 2016-04-07 DIAGNOSIS — T82110A Breakdown (mechanical) of cardiac electrode, initial encounter: Secondary | ICD-10-CM

## 2016-04-07 DIAGNOSIS — I4891 Unspecified atrial fibrillation: Secondary | ICD-10-CM | POA: Diagnosis not present

## 2016-04-07 DIAGNOSIS — R197 Diarrhea, unspecified: Secondary | ICD-10-CM | POA: Diagnosis not present

## 2016-04-07 DIAGNOSIS — I4819 Other persistent atrial fibrillation: Secondary | ICD-10-CM

## 2016-04-07 DIAGNOSIS — R1111 Vomiting without nausea: Secondary | ICD-10-CM | POA: Diagnosis not present

## 2016-04-07 DIAGNOSIS — J40 Bronchitis, not specified as acute or chronic: Secondary | ICD-10-CM | POA: Diagnosis not present

## 2016-04-07 DIAGNOSIS — R1110 Vomiting, unspecified: Secondary | ICD-10-CM | POA: Diagnosis not present

## 2016-04-07 LAB — COMPREHENSIVE METABOLIC PANEL
ALBUMIN: 3.5 g/dL (ref 3.5–5.0)
ALK PHOS: 135 U/L — AB (ref 38–126)
ALT: 58 U/L — ABNORMAL HIGH (ref 14–54)
ANION GAP: 15 (ref 5–15)
AST: 83 U/L — ABNORMAL HIGH (ref 15–41)
BILIRUBIN TOTAL: 1 mg/dL (ref 0.3–1.2)
BUN: 21 mg/dL — AB (ref 6–20)
CALCIUM: 8.2 mg/dL — AB (ref 8.9–10.3)
CO2: 21 mmol/L — ABNORMAL LOW (ref 22–32)
Chloride: 99 mmol/L — ABNORMAL LOW (ref 101–111)
Creatinine, Ser: 0.84 mg/dL (ref 0.44–1.00)
GFR calc Af Amer: >60 mL/min (ref >60)
GLUCOSE: 215 mg/dL — AB (ref 65–99)
POTASSIUM: 4.3 mmol/L (ref 3.5–5.1)
Sodium: 135 mmol/L (ref 135–145)
TOTAL PROTEIN: 7.2 g/dL (ref 6.5–8.1)

## 2016-04-07 LAB — CBC
HEMATOCRIT: 42 % (ref 36.0–46.0)
Hemoglobin: 13.9 g/dL (ref 12.0–15.0)
MCH: 33.3 pg (ref 26.0–34.0)
MCHC: 33.1 g/dL (ref 30.0–36.0)
MCV: 100.5 fL — AB (ref 78.0–100.0)
Platelets: 93 10*3/uL — ABNORMAL LOW (ref 150–400)
RBC: 4.18 MIL/uL (ref 3.87–5.11)
RDW: 14.9 % (ref 11.5–15.5)
WBC: 6.8 10*3/uL (ref 4.0–10.5)

## 2016-04-07 LAB — GLUCOSE, CAPILLARY
GLUCOSE-CAPILLARY: 212 mg/dL — AB (ref 65–99)
Glucose-Capillary: 220 mg/dL — ABNORMAL HIGH (ref 65–99)
Glucose-Capillary: 204 mg/dL — ABNORMAL HIGH (ref 65–99)
Glucose-Capillary: 184 mg/dL — ABNORMAL HIGH (ref 65–99)
Glucose-Capillary: 196 mg/dL — ABNORMAL HIGH (ref 65–99)

## 2016-04-07 LAB — URINE CULTURE: Special Requests: NORMAL

## 2016-04-07 MED ORDER — TORSEMIDE 20 MG PO TABS
20.0000 mg | ORAL_TABLET | Freq: Every day | ORAL | Status: DC
  Administered 2016-04-07 – 2016-04-08 (×2): 20 mg via ORAL
  Filled 2016-04-07 (×2): qty 1

## 2016-04-07 NOTE — Progress Notes (Signed)
Progress Note  Patient Name: Alexis [name] Date of Encounter: 04/07/2016  Primary Cardiologist: Dr. Irish Lack  Subjective   Feeling better this morning. HR much improved, does report feeling bloated.   Inpatient Medications    Scheduled Meds: . allopurinol  100 mg Oral Daily  . apixaban  5 mg Oral BID  . atorvastatin  10 mg Oral Daily  . azelastine  2 spray Each Nare BID  . budesonide  0.25 mg Nebulization BID  . DULoxetine  30 mg Oral QHS  . fluticasone  1 spray Each Nare Daily  . hydrocortisone cream   Topical TID  . insulin aspart  0-15 Units Subcutaneous TID WC  . insulin aspart  0-5 Units Subcutaneous QHS  . levothyroxine  175 mcg Oral QAC breakfast  . metoprolol tartrate  25 mg Oral BID  . ramipril  2.5 mg Oral Daily  . sodium chloride flush  3 mL Intravenous Q12H  . torsemide  20 mg Oral Daily   Continuous Infusions:  PRN Meds: acetaminophen **OR** acetaminophen, albuterol, diltiazem, ondansetron **OR** ondansetron (ZOFRAN) IV   Vital Signs    Vitals:   04/07/16 0156 04/07/16 0215 04/07/16 0623 04/07/16 1143  BP: 107/64  130/65 (!) 111/56  Pulse: 95  87 93  Resp: 19  20 20   Temp:   98.4 F (36.9 C) 98 F (36.7 C)  TempSrc:   Oral Oral  SpO2:  98% 98% 100%  Weight:   236 lb 3.2 oz (107.1 kg)   Height:        Intake/Output Summary (Last 24 hours) at 04/07/16 1302 Last data filed at 04/07/16 8088  Gross per 24 hour  Intake          3312.42 ml  Output             1825 ml  Net          1487.42 ml   Filed Weights   04/05/16 2051 04/06/16 0629 04/07/16 0623  Weight: 233 lb 4.8 oz (105.8 kg) 233 lb 11.2 oz (106 kg) 236 lb 3.2 oz (107.1 kg)    Telemetry    AF with rates controlled (pacer spikes appear inappropriate) - Personally Reviewed  ECG    N/a - Personally Reviewed  Physical Exam   GEN: Pleasant obese WF, No acute distress.   Neck: No JVD Cardiac: Irreg Irreg no murmurs, rubs, or gallops.  Respiratory: Clear to auscultation  bilaterally. GI: Soft, nontender, non-distended  MS: No edema; No deformity, Both feet cool to touch, diminished pulses bilaterally, 1+? On the left. Peeling rash to right toes.   Neuro:  Nonfocal  Psych: Normal affect   Labs    Chemistry Recent Labs Lab 04/05/16 0923 04/06/16 0157 04/07/16 0542  NA 138 135 135  K 4.7 4.0 4.3  CL 97* 102 99*  CO2 29 22 21*  GLUCOSE 335* 213* 215*  BUN 34* 30* 21*  CREATININE 0.93 0.90 0.84  CALCIUM 10.0 8.3* 8.2*  PROT 8.9*  --  7.2  ALBUMIN 4.3  --  3.5  AST 85*  --  83*  ALT 70*  --  58*  ALKPHOS 155*  --  135*  BILITOT 1.3*  --  1.0  GFRNONAA >60 >60 >60  GFRAA >60 >60 >60  ANIONGAP 12 11 15      Hematology Recent Labs Lab 04/05/16 0923 04/06/16 0157 04/07/16 0542  WBC 5.7 3.6* 6.8  RBC 4.99 3.89 4.18  HGB 16.6* 12.9 13.9  HCT  48.7* 38.4 42.0  MCV 97.6 98.7 100.5*  MCH 33.3 33.2 33.3  MCHC 34.1 33.6 33.1  RDW 13.8 14.4 14.9  PLT 92* 74* 93*    Cardiac EnzymesNo results for input(s): TROPONINI in the last 168 hours. No results for input(s): TROPIPOC in the last 168 hours.   BNP Recent Labs Lab 04/06/16 1625  BNP 75.9     DDimer No results for input(s): DDIMER in the last 168 hours.   Radiology    No results found.  Cardiac Studies   N/A  Patient Profile     66 y.o. female PMH with of Afib, COPD, IDDM, HTN, HL, systolic HF who presented with n/v/d who developed AF RVR  Assessment & Plan    1. AF RVR: First dx in 2000, on Toprol XL and Eliquis for [place]. Presented with GI illness and developed AF RVR with rates in the 120-160s. Given a dose of IV Dilt without much improvement.  -- changed Toprol XL to Metoprolol Tartrate 53m BID yesterday and rate is better controlled.  -- device interrogation showed she has been in AF since 3/17. EKG and telemetry continued to show inappropriate pacing spikes. Check CXR for LV lead placement. Will ask for EP consult. -- continue Eliquis for [place] This patients CHA2DS2-VASc  Score and unadjusted Ischemic Stroke Rate (% per year) is equal to 4.8 % stroke rate/year from a score of 4 CHF, HTN, DM, Female  2. N/V/D: Lactic acid was elevated but now trending down. LFTs were elevated but [place]abd was negative. Likely viral illness. Improvement in symptoms today. Primary following.  -- + norovirus  3. Systolic HF: Last echo showed low normal EF with dilated LA. States she has noticed weight gain in the past week. Takes torsemide, but held on admission. States she feels bloated today and was dyspneic with walking yesterday. -- BNP yesterday was normal. Home torsemide has been restarted. Weight is up.   4. HTN: Stable, recommendations as above.   5. PVD: Noted diminished pulses in LE bilaterally. Has hx of smoking. Followed by podiatry. States she's had dopplers in the past but no interventions. Consider LE dopplers.   6. IDDM: Cbgs have been elevated here, was wearing insulin pump in the outpatient setting.   7. Hypothyroidism: TSH elevated, but T4 normal. On synthroid.   8. Thrombocytopenia: Noted during previous admissions. Slightly improved today. No reported bleeding, would monitor as she is on Eliquis.   9. Elevated LFTs: Noted on admission. Would recommend holding statin at this time.   Signed, LReino Bellis NP  04/07/2016, 1:02 PM    Patient seen and examined. Agree with assessment and plan. Diarrhea improved; + for Norovirus. AF rate better.  Pacer interrogation yesterday reveals elevated LV thresholds; there appears to be inappropiate pacemaker spikes.  Pt states hat shortly after insertion on this last pacemaker, she had fallen and when she was picked up by an aide her left arm was raised abruptly. ? If could have contributed to some slight dislodegement of lead.  Will check PA and LAT CXR today and ask EP to see and establish pacemaker follow-up. Will resume torsemide.    TTroy Sine MD, F[facility] Inc2/08/2016 1:18 PM

## 2016-04-07 NOTE — Consult Note (Signed)
ELECTROPHYSIOLOGY CONSULT NOTE    Patient ID: Alexis [name] MRN: [id number], DOB/AGE: [birth year] 68 y.o.  Admit date: 04/05/2016 Date of Consult: 04/07/2016   Primary Physician: Mackie Pai, PA-C Primary Cardiologist: Dr. Minna Merritts >> Dr. Irish Lack   Reason for Consultation: pacer evaluation  HPI: Alexis [name] is a 66 y.o. female with known AFi, COPD, IDDM, HTN, HLD, CHF (systolic per notes), hx of Gra[facility]'s tx with radioactive iodine, and a PPM, was admitted to [facility] 04/05/16 with c/o N/V/D, SOB, being treated for Likely viral gastroenteritis, transaminitis, and AFib w/RVR and fluid OL.   She is feeling much better currently, unaware of any palpitations, no CP, no c/o SOB at this time. EP was asked to evaluate the patient's pacer function with observation of inappropriate pacing.  DEVICE information: MDT CRT-P, implanted 05/23/14, noting RA/RV leads implanted 2004, Dr. Minna Merritts LV lead is in the HIS position  AF history 1st diagnosed 2000 (reportedly at that time drinking heavily, no longer drinks at all)  Past Medical History:  Diagnosis Date  . Allergy   . Anxiety   . Arthritis   . Asthma   . Atrial fibrillation ([place])   . CHF (congestive heart failure) ([place])   . COPD (chronic obstructive pulmonary disease) ([place])   . Diabetes mellitus without complication ([place])   . Hyperlipidemia   . Hypertension   . Myocardial infarction    several  . Peripheral neuropathy ([place])   . Thyroid disease      Surgical History:  Past Surgical History:  Procedure Laterality Date  . RADIOFREQUENCY ABLATION    . TOTAL KNEE ARTHROPLASTY       Prescriptions Prior to Admission  Medication Sig Dispense Refill Last Dose  . albuterol (PROVENTIL HFA;VENTOLIN HFA) 108 (90 Base) MCG/ACT inhaler Inhale 2 puffs into the lungs every 6 (six) hours as needed for wheezing or shortness of breath. 1 Inhaler 2 month ago at Unknown time  . albuterol (PROVENTIL) (2.5 MG/3ML) 0.083% nebulizer solution Take 3  mLs (2.5 mg total) by nebulization every 6 (six) hours as needed for wheezing or shortness of breath. 150 mL 1 month ago at Unknown time  . allopurinol (ZYLOPRIM) 100 MG tablet Take ONE (1) tablet by mouth once daily (Patient taking differently: Take 100 mg by mouth at bedtime. ) 90 tablet 3 04/04/2016 at Unknown time  . apixaban (ELIQUIS) 5 MG TABS tablet Take 1 tablet (5 mg total) by mouth 2 (two) times daily. 60 tablet 6 04/04/2016 at 1900  . azelastine (ASTELIN) 0.1 % nasal spray Place 2 sprays into both nostrils 2 (two) times daily. Use in each nostril as directed (Patient taking differently: Place 2 sprays into both nostrils 2 (two) times daily as needed for rhinitis or allergies. Use in each nostril as directed) 30 mL 3 month ago at Unknown time  . Blood Glucose Monitoring Suppl (GLUCOCOM BLOOD GLUCOSE MONITOR) DEVI Use to check blood sugars 6 times daily E11.65   04/05/2016 at Unknown time  . DULoxetine (CYMBALTA) 30 MG capsule Take 1 capsule (30 mg total) by mouth 2 (two) times daily. (Patient taking differently: Take 30 mg by mouth at bedtime. ) 60 capsule 1 04/04/2016 at Unknown time  . fluticasone (FLONASE) 50 MCG/ACT nasal spray Place 1 spray into both nostrils daily. (Patient taking differently: Place 1 spray into both nostrils at bedtime. Place 1 spray into both nostrils daily.) 16 g 3 04/04/2016 at Unknown time  . fluticasone (FLOVENT HFA) 110 MCG/ACT inhaler Inhale 2 puffs  into the lungs 2 (two) times daily. 1 Inhaler 3 04/04/2016 at Unknown time  . Insulin Disposable Pump (V-GO 40) KIT Inject into the skin See admin instructions. Use with Novolog - 6 pumps before each meal   04/05/2016 at 300  . levothyroxine (SYNTHROID, LEVOTHROID) 175 MCG tablet Take 175 mcg by mouth daily before breakfast.   04/04/2016 at Unknown time  . metoprolol succinate (TOPROL-XL) 25 MG 24 hr tablet Take 1 tablet (25 mg total) by mouth daily. 90 tablet 3 04/04/2016 at 800  . naproxen sodium (ALEVE) 220 MG tablet Take 440 mg by  mouth every 12 (twelve) hours.   04/04/2016 at pm  . naproxen sodium (ALEVE) 220 MG tablet Take 440 mg by mouth 2 (two) times daily.   04/04/2016 at pm  . Neo-Bacit-Poly-Lidocaine (TRIPLE ANTIBIOTIC/LIDOCAINE EX) Apply 1 application topically 2 (two) times daily.   04/04/2016 at pm  . Prenatal Vit-Fe Fumarate-FA (MULTIVITAMIN-PRENATAL) 27-0.8 MG TABS tablet Take 1 tablet by mouth daily. Takes for nail and hair growth    04/04/2016 at Unknown time  . ramipril (ALTACE) 2.5 MG capsule Take ONE (1)  capsule by mouth once daily (Patient taking differently: Take 2.5 mg by mouth at bedtime. ) 90 capsule 1 04/04/2016 at Unknown time  . spironolactone (ALDACTONE) 25 MG tablet Take ONE (1) tablet by mouth once daily (Patient taking differently: Take 25 mg by mouth daily. ) 90 tablet 1 04/04/2016 at Unknown time  . torsemide (DEMADEX) 10 MG tablet Take 1 tablet (10 mg total) by mouth daily. 90 tablet 1 04/04/2016 at Unknown time  . [DISCONTINUED] albuterol (PROVENTIL HFA;VENTOLIN HFA) 108 (90 Base) MCG/ACT inhaler Inhale 2 puffs into the lungs every 6 (six) hours as needed for wheezing or shortness of breath. 1 Inhaler 2 Past Week at Unknown time  . atorvastatin (LIPITOR) 10 MG tablet Take 1 tablet (10 mg total) by mouth daily. (Patient not taking: Reported on 04/05/2016) 30 tablet 0 Not Taking at Unknown time  . HYDROcodone-homatropine (HYCODAN) 5-1.5 MG/5ML syrup Take 5 mLs by mouth every 8 (eight) hours as needed for cough. (Patient not taking: Reported on 04/05/2016) 120 mL 0 Not Taking at Unknown time  . Insulin Pen Needle (B-D ULTRAFINE III SHORT PEN) 31G X 8 MM MISC Use for insulin pen up to five times daily E11.65   Taking    Inpatient Medications:  . allopurinol  100 mg Oral Daily  . apixaban  5 mg Oral BID  . azelastine  2 spray Each Nare BID  . budesonide  0.25 mg Nebulization BID  . DULoxetine  30 mg Oral QHS  . fluticasone  1 spray Each Nare Daily  . hydrocortisone cream   Topical TID  . insulin aspart  0-15  Units Subcutaneous TID WC  . insulin aspart  0-5 Units Subcutaneous QHS  . levothyroxine  175 mcg Oral QAC breakfast  . metoprolol tartrate  25 mg Oral BID  . ramipril  2.5 mg Oral Daily  . sodium chloride flush  3 mL Intravenous Q12H  . torsemide  20 mg Oral Daily    Allergies:  Allergies  Allergen Reactions  . Indomethacin Other (See Comments)     Renal Insufficiency  . Pregabalin Other (See Comments)    Other reaction(s): DIZZINESS   . Sulfa Antibiotics Hives  . Sulfamethoxazole Hives    Social History   Social History  . Marital status: Married    Spouse name: N/A  . Number of children: N/A  .  Years of education: N/A   Occupational History  . retired    Social History Main Topics  . Smoking status: Former Smoker    Quit date: 03/01/1998  . Smokeless tobacco: Never Used  . 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  . Drug use: No     Comment: remote use  . Sexual activity: Not on file   Other Topics Concern  . Not on file   Social History Narrative  . No narrative on file     Family History  Problem Relation Age of Onset  . Diabetes Mother   . Hyperlipidemia Mother   . Diabetes Father   . Hyperlipidemia Father   . Heart disease Father     before age 47  . Heart attack Father   . Diabetes Brother   . Hyperlipidemia Brother   . Heart attack Brother      Review of Systems: All other systems reviewed and are otherwise negative except as noted above.  Physical Exam: Vitals:   04/07/16 0156 04/07/16 0215 04/07/16 0623 04/07/16 1143  BP: 107/64  130/65 (!) 111/56  Pulse: 95  87 93  Resp: _0 Temp:   98.4 F (36.9 C) 98 F (36.7 C)  TempSrc:   Oral Oral  SpO2:  98% 98% 100%  Weight:   236 lb 3.2 oz (107.1 kg)   Height:        GEN- The patient is well appearing, alert and oriented x 3 today.   HEENT: normocephalic, atraumatic; sclera clear, conjunctiva pink; hearing intact; oropharynx clear;  neck supple, no JVP Lymph- no cervical lymphadenopathy Lungs- CTA b/l  normal work of breathing.  No wheezes, rales, rhonchi Heart- IRRR, no murmurs, rubs or gallops, PMI not laterally displaced GI- soft, non-tender, non-distended, bowel sounds present Extremities- no clubbing, cyanosis, or edema MS- no significant deformity or atrophy Skin- warm and dry, no rash or lesion Psych- euthymic mood, full affect Neuro- no gross deficits observed  Labs:   Lab Results  Component Value Date   WBC 6.8 04/07/2016   HGB 13.9 04/07/2016   HCT 42.0 04/07/2016   MCV 100.5 (H) 04/07/2016   PLT 93 (L) 04/07/2016    Recent Labs Lab 04/07/16 0542  NA 135  K 4.3  CL 99*  CO2 21*  BUN 21*  CREATININE 0.84  CALCIUM 8.2*  PROT 7.2  BILITOT 1.0  ALKPHOS 135*  ALT 58*  AST 83*  GLUCOSE 215*      Radiology/Studies:  US Abdomen Limited Ruq Result Date: 04/05/2016 CLINICAL DATA:  66 year old female with right upper quadrant abdominal pain, nausea vomiting and diarrhea for 1 day. Hyperglycemia. Initial encounter. EXAM: US ABDOMEN LIMITED - RIGHT UPPER QUADRANT COMPARISON:  Right upper quadrant ultrasound 05/21/2014 and [facility] CT Abdomen and Pelvis 03/13/2010 FINDINGS: Gallbladder: Chronic cholelithiasis. Superimposed sludge. Today individual stones are estimated up to 1.9 cm. Gallbladder wall thickness remains normal at 2 mm. No sonographic Murphy sign elicited. No pericholecystic fluid. Common bile duct: Diameter: 2 mm, normal Liver: Chronically echogenic (image 36). Dense parenchyma somewhat difficult penetrate. No intrahepatic biliary ductal dilatation or discrete liver lesion identified. IMPRESSION: Chronic cholelithiasis and fatty liver disease. No evidence of acute cholecystitis or acute biliary obstruction. Electronically Signed   By: Genevie Ann M.D.   On: 04/05/2016 11:06    EKG: AFib, 109bpm, QRS 58m with paced beats TELEMETRY: AFib, CVR generally 80's,  there ar pacing  spikes noted before and within the QRS complex, intermittently appears to have pacing spikes that do not conduct, and at in appropriate times.  11/26/15: TTE Study Conclusions - Left ventricle: The cavity size was normal. Wall thickness was   normal. Systolic function was normal. The estimated ejection   fraction was in the range of 50% to 55%. - Left atrium: The atrium was mildly dilated. - Right ventricle: The cavity size was mildly dilated. - Atrial septum: No defect or patent foramen ovale was identified. - Impressions: Overall poor image quality Impressions: - Overall poor image quality  Assessment and Plan:   1. MDT CRT-P device     Device is interrogated She is HIS pacing currently, This lead has high pacing thresholds, RV lead is essentially programmed off, but measurements are stable with good sensing/threshold testing     She has become permanent AFib since march 2017  The device is reprogrammed to VVI 50bpm with the RV lead, HIS lead is programmed off given her LVEF is 50-55% by her echo only a few months ago, and anticipate she will rarely RV pace. She no longer can follow with her MD at [place], will arrange EP follow up with Dr. Curt Bears for the next couple months to assess how much RV pacing she does, though so far programmed VVI 50, she is not pacing at all.   2. AFib- persistent/long standing    CVR currently     CHA2DS2Vasc is 4-5 on Eliquis  Continue ongoing care with her primary cardiac and medicine team, EP remains available if needed.  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   We will reprogram her RV lead on and decrease lower rate limit to 50.  I suspect she will pace far  less than 20%, ie below the threshold for pacing cardiomyopathy.    Her AFib is now persistent-long standing, having been in AFib for months.  We will pursue a course of rate control and anticipate we will designate her as permanent soon  We will arrange Fu with Dr Carlyn Reichert at the [facility] [place] [facility].  I have communicated with him about this

## 2016-04-07 NOTE — Progress Notes (Addendum)
MSN SN at bedside.  Assisted patient to ambulate to chair. Patient observed to ambulate without difficulty.  Patient safely in chair with fall prevention precaution interventions implemented.  Patient denies pain.  Patient states she is without heat in her home.  Notified assigned RN, Gabriel Cirri, & requested RN notify CM.

## 2016-04-07 NOTE — Evaluation (Signed)
Physical Therapy Evaluation Patient Details Name: Alexis [name] MRN: [id number] DOB: [birth year] Today's Date: 04/07/2016   History of Present Illness  Pt is a 66 y/o female admitted secondary to nausea, vomiting and diarrhea. PMH including but not limited to A-fib, CHF, COPD, DM, HTN, hx of L TKA and history of "several" MI's.   Clinical Impression  Pt presented supine in bed with HOB elevated, initially asleep but easily aroused and willing to participate in therapy session. Prior to admission, pt reported that she was independent with all functional mobility and ADLs. Pt currently requires min guard for safety with transfers and ambulation with use of RW. Pt would continue to benefit from skilled physical therapy services at this time while admitted to address her below listed limitations in order to improve her overall safety and independence with functional mobility.       Follow Up Recommendations No PT follow up    Equipment Recommendations  None recommended by PT    Recommendations for Other Services       Precautions / Restrictions Precautions Precautions: Fall Precaution Comments: enteric Restrictions Weight Bearing Restrictions: No      Mobility  Bed Mobility Overal bed mobility: Modified Independent                Transfers Overall transfer level: Needs assistance Equipment used: Rolling walker (2 wheeled) Transfers: Sit to/from Stand Sit to Stand: Min guard         General transfer comment: increased time, min guard for safety  Ambulation/Gait Ambulation/Gait assistance: Min guard Ambulation Distance (Feet): 75 Feet Assistive device: Rolling walker (2 wheeled) Gait Pattern/deviations: Step-through pattern;Decreased stride length Gait velocity: decreased Gait velocity interpretation: Below normal speed for age/gender General Gait Details: pt demonstrated safety with use of RW, min guard for safety  Stairs            Wheelchair Mobility    Modified Rankin (Stroke Patients Only)       Balance Overall balance assessment: Needs assistance Sitting-balance support: Feet supported;No upper extremity supported Sitting balance-Leahy Scale: Good     Standing balance support: During functional activity;Bilateral upper extremity supported Standing balance-Leahy Scale: Poor Standing balance comment: pt reliant on bilateral UEs on RW                             Pertinent Vitals/Pain Pain Assessment: No/denies pain    Home Living Family/patient expects to be discharged to:: Private residence Living Arrangements: Spouse/significant other Available Help at Discharge: Family;Available 24 hours/day Type of Home: House Home Access: Stairs to enter Entrance Stairs-Rails: None Entrance Stairs-Number of Steps: 1 Home Layout: One level Home Equipment: Walker - 2 wheels;Cane - single point      Prior Function Level of Independence: Independent               Hand Dominance        Extremity/Trunk Assessment   Upper Extremity Assessment Upper Extremity Assessment: Overall WFL for tasks assessed    Lower Extremity Assessment Lower Extremity Assessment: Generalized weakness    Cervical / Trunk Assessment Cervical / Trunk Assessment: Normal  Communication   Communication: No difficulties  Cognition Arousal/Alertness: Awake/alert Behavior During Therapy: WFL for tasks assessed/performed Overall Cognitive Status: Within Functional Limits for tasks assessed                      General Comments      Exercises  Assessment/Plan    PT Assessment Patient needs continued PT services  PT Problem List Decreased strength;Decreased activity tolerance;Decreased balance;Decreased mobility;Decreased coordination;Decreased knowledge of use of DME;Decreased safety awareness          PT Treatment Interventions DME instruction;Gait training;Stair training;Functional mobility training;Therapeutic  activities;Therapeutic exercise;Balance training;Neuromuscular re-education;Patient/family education    PT Goals (Current goals can be found in the Care Plan section)  Acute Rehab PT Goals Patient Stated Goal: return home PT Goal Formulation: With patient Time For Goal Achievement: 04/21/16 Potential to Achieve Goals: Good    Frequency Min 3X/week   Barriers to discharge        Co-evaluation               End of Session Equipment Utilized During Treatment: Gait belt Activity Tolerance: Patient tolerated treatment well Patient left: in bed;with call bell/phone within reach Nurse Communication: Mobility status    Functional Assessment Tool Used: clinical judgement Functional Limitation: Mobility: Walking and moving around Mobility: Walking and Moving Around Current Status (D8264): At least 1 percent but less than 20 percent impaired, limited or restricted Mobility: Walking and Moving Around Goal Status [phone]): 0 percent impaired, limited or restricted    Time: 1018-1030 PT Time Calculation (min) (ACUTE ONLY): 12 min   Charges:   PT Evaluation $PT Eval Low Complexity: 1 Procedure     PT G Codes:   PT G-Codes **NOT FOR INPATIENT CLASS** Functional Assessment Tool Used: clinical judgement Functional Limitation: Mobility: Walking and moving around Mobility: Walking and Moving Around Current Status (N4076): At least 1 percent but less than 20 percent impaired, limited or restricted Mobility: Walking and Moving Around Goal Status [phone]): 0 percent impaired, limited or restricted    [facility] 04/07/2016, 11:46 AM Sherie Don, PT, DPT [phone]

## 2016-04-07 NOTE — Progress Notes (Signed)
Patient woke up diaphoretic. Check CBG 220. B/P 102/70. HR in the 80"s to 120"s non-sustaining. Charge nurse notified. Cardiac monitor shows 86-103. Zofran 41m and diet ginger ale  given for nausea. Patient resting quietly at this time.  Will continue  monitor the patient frequently.

## 2016-04-07 NOTE — Progress Notes (Signed)
PROGRESS NOTE  Alexis [name]:[id number] DOB: 10/29/1950 DOA: 04/05/2016 PCP: Mackie Pai, PA-C   LOS: 0 days   Brief Narrative: 66 y.o.femalewith medical history significant of CHF, afib on anticoagulation, CAD, and COPD presenting with n/v/d. No fever. No sick contacts. In ED, pt noted to be in a-fib with RVR, TRH asked to admit for further evaluation.   Assessment & Plan: Principal Problem:   Vomiting and diarrhea Active Problems:   Peripheral vascular disease (HCC)   Diabetes (HCC)   Atrial fibrillation with rapid ventricular response (HCC)   Hypothyroidism   Thrombocytopenia (HCC)   Persistent atrial fibrillation (HCC)   N/V/D - Likely viral gastroenteritis given acute onset and current presentation, GI pathogen panel tested positive for enterovirus, her symptoms are improving, she is able to eat and her diarrhea has slowed down significantly - C. Diff negative - stop IVF as pt more swollen - continue regular diet   Transaminitis - from acute illness - abd [place] with no signs of acute cholecystitis  - CMET with improvement in her LFTs  Thrombocytopenia  - review of records indicated chronic thrombocytopenia with Plt in 70 -90's - no signs of bleeding - Stable this morning  Afib with RVR, on Eliquis  - rates improved, pacemaker interrogation per cardiology  DM - using insulin pump at home   PVD - Right toes appear to have had topical reaction to medication - Will not give antibiotics for this, use hydrocortisone cream 1% TID - Has apparent poor circulation chronically on left  Hypothyroidism - Elevated TSH in 2/17 - TSH 8.4, repeat TSH in 3 weeks as an outpatient  Morbid obesity  - Body mass index is 42.74 kg/m.   DVT prophylaxis: Eliquis Code Status: DNR Family Communication: no family bedside Disposition Plan: TBD  Consultants:   Cardiology   Procedures:   None   Antimicrobials:  None    Subjective: - no chest pain,  shortness of breath, no abdominal pain, nausea or vomiting.   Objective: Vitals:   04/07/16 0156 04/07/16 0215 04/07/16 0623 04/07/16 1143  BP: 107/64  130/65 (!) 111/56  Pulse: 95  87 93  Resp: 19  20 20   Temp:   98.4 F (36.9 C) 98 F (36.7 C)  TempSrc:   Oral Oral  SpO2:  98% 98% 100%  Weight:   107.1 kg (236 lb 3.2 oz)   Height:        Intake/Output Summary (Last 24 hours) at 04/07/16 1457 Last data filed at 04/07/16 1413  Gross per 24 hour  Intake          3492.42 ml  Output             1825 ml  Net          1667.42 ml   Filed Weights   04/05/16 2051 04/06/16 0629 04/07/16 0623  Weight: 105.8 kg (233 lb 4.8 oz) 106 kg (233 lb 11.2 oz) 107.1 kg (236 lb 3.2 oz)    Examination: Constitutional: NAD Vitals:   04/07/16 0156 04/07/16 0215 04/07/16 0623 04/07/16 1143  BP: 107/64  130/65 (!) 111/56  Pulse: 95  87 93  Resp: 19  20 20   Temp:   98.4 F (36.9 C) 98 F (36.7 C)  TempSrc:   Oral Oral  SpO2:  98% 98% 100%  Weight:   107.1 kg (236 lb 3.2 oz)   Height:       Eyes: lids and conjunctivae normal ENMT: Mucous membranes are  moist.  Neck: normal, supple, no masses, no thyromegaly Respiratory: clear to auscultation bilaterally, no wheezing, no crackles. Normal respiratory effort.  Cardiovascular: irregular, no MRG Abdomen: no tenderness. Bowel sounds positive.  Neurologic: non focal   Data Reviewed: I have personally reviewed following labs and imaging studies  CBC:  Recent Labs Lab 04/05/16 0923 04/06/16 0157 04/07/16 0542  WBC 5.7 3.6* 6.8  HGB 16.6* 12.9 13.9  HCT 48.7* 38.4 42.0  MCV 97.6 98.7 100.5*  PLT 92* 74* 93*   Basic Metabolic Panel:  Recent Labs Lab 04/05/16 0923 04/06/16 0157 04/07/16 0542  NA 138 135 135  K 4.7 4.0 4.3  CL 97* 102 99*  CO2 29 22 21*  GLUCOSE 335* 213* 215*  BUN 34* 30* 21*  CREATININE 0.93 0.90 0.84  CALCIUM 10.0 8.3* 8.2*   GFR: Estimated Creatinine Clearance: 74.8 mL/min (by C-G formula based on SCr of  0.84 mg/dL). Liver Function Tests:  Recent Labs Lab 04/05/16 0923 04/07/16 0542  AST 85* 83*  ALT 70* 58*  ALKPHOS 155* 135*  BILITOT 1.3* 1.0  PROT 8.9* 7.2  ALBUMIN 4.3 3.5    Recent Labs Lab 04/05/16 0923  LIPASE 49   No results for input(s): AMMONIA in the last 168 hours. Coagulation Profile: No results for input(s): INR, PROTIME in the last 168 hours. Cardiac Enzymes: No results for input(s): CKTOTAL, CKMB, CKMBINDEX, TROPONINI in the last 168 hours. BNP (last 3 results)  Recent Labs  02/10/16 1219  PROBNP 60.0   HbA1C: No results for input(s): HGBA1C in the last 72 hours. CBG:  Recent Labs Lab 04/06/16 1637 04/06/16 2236 04/07/16 0146 04/07/16 0748 04/07/16 1141  GLUCAP 198* 219* 220* 204* 212*   Lipid Profile: No results for input(s): CHOL, HDL, LDLCALC, TRIG, CHOLHDL, LDLDIRECT in the last 72 hours. Thyroid Function Tests:  Recent Labs  04/06/16 0157  TSH 8.468*  FREET4 0.79   Anemia Panel: No results for input(s): VITAMINB12, FOLATE, FERRITIN, TIBC, IRON, RETICCTPCT in the last 72 hours. Urine analysis:    Component Value Date/Time   COLORURINE YELLOW 04/05/2016 [place] 04/05/2016 1203   LABSPEC 1.022 04/05/2016 1203   PHURINE 6.0 04/05/2016 1203   GLUCOSEU 250 (A) 04/05/2016 1203   HGBUR NEGATIVE 04/05/2016 [place] 04/05/2016 [place] 04/05/2016 1203   PROTEINUR NEGATIVE 04/05/2016 1203   NITRITE NEGATIVE 04/05/2016 1203   LEUKOCYTESUR TRACE (A) 04/05/2016 1203   Sepsis Labs: Invalid input(s): PROCALCITONIN, LACTICIDVEN  Recent Results (from the past 240 hour(s))  Urine culture     Status: Abnormal   Collection Time: 04/05/16  4:50 PM  Result Value Ref Range Status   Specimen Description URINE, CLEAN CATCH  Final   Special Requests Normal  Final   Culture MULTIPLE SPECIES PRESENT, SUGGEST RECOLLECTION (A)  Final   Report Status 04/07/2016 FINAL  Final  Culture, blood  (routine x 2)     Status: None (Preliminary result)   Collection Time: 04/05/16  5:27 PM  Result Value Ref Range Status   Specimen Description BLOOD LEFT HAND  Final   Special Requests   Final    BOTTLES DRAWN AEROBIC AND ANAEROBIC 5CC RED TOP 4CC BLUE   Culture   Final    NO GROWTH < 24 HOURS Performed at [facility], [place] [address] [place], [place] [address]    Report Status PENDING  Incomplete  Culture, blood (routine x 2)     Status:  None (Preliminary result)   Collection Time: 04/05/16  5:41 PM  Result Value Ref Range Status   Specimen Description BLOOD RIGHT HAND  Final   Special Requests IN PEDIATRIC BOTTLE 3CC  Final   Culture   Final    NO GROWTH < 24 HOURS Performed at [facility], [place] [address], [place], [place] [address]    Report Status PENDING  Incomplete  Gastrointestinal Panel by PCR , Stool     Status: Abnormal   Collection Time: 04/05/16  9:44 PM  Result Value Ref Range Status   Campylobacter species NOT DETECTED NOT DETECTED Final   Plesimonas shigelloides NOT DETECTED NOT DETECTED Final   Salmonella species NOT DETECTED NOT DETECTED Final   Yersinia enterocolitica NOT DETECTED NOT DETECTED Final   Vibrio species NOT DETECTED NOT DETECTED Final   Vibrio cholerae NOT DETECTED NOT DETECTED Final   Enteroaggregative E coli (EAEC) NOT DETECTED NOT DETECTED Final   Enteropathogenic E coli (EPEC) NOT DETECTED NOT DETECTED Final   Enterotoxigenic E coli (ETEC) NOT DETECTED NOT DETECTED Final   Shiga like toxin producing E coli (STEC) NOT DETECTED NOT DETECTED Final   Shigella/Enteroinvasive E coli (EIEC) NOT DETECTED NOT DETECTED Final   Cryptosporidium NOT DETECTED NOT DETECTED Final   Cyclospora cayetanensis NOT DETECTED NOT DETECTED Final   Entamoeba histolytica NOT DETECTED NOT DETECTED Final   Giardia lamblia NOT DETECTED NOT DETECTED Final   Adenovirus F40/41 NOT DETECTED NOT DETECTED Final   Astrovirus NOT DETECTED NOT DETECTED Final    Norovirus GI/GII DETECTED (A) NOT DETECTED Final    Comment: CRITICAL RESULT CALLED TO, READ BACK BY AND VERIFIED WITH: CHRISTINA VILLANUEVA AT 1913 ON 04/06/2016 JLJ    Rotavirus A NOT DETECTED NOT DETECTED Final   Sapovirus (I, II, IV, and V) NOT DETECTED NOT DETECTED Final  C difficile quick scan w PCR reflex     Status: None   Collection Time: 04/05/16  9:44 PM  Result Value Ref Range Status   C Diff antigen NEGATIVE NEGATIVE Final   C Diff toxin NEGATIVE NEGATIVE Final   C Diff interpretation No C. difficile detected.  Final      Radiology Studies: No results found.   Scheduled Meds: . allopurinol  100 mg Oral Daily  . apixaban  5 mg Oral BID  . azelastine  2 spray Each Nare BID  . budesonide  0.25 mg Nebulization BID  . DULoxetine  30 mg Oral QHS  . fluticasone  1 spray Each Nare Daily  . hydrocortisone cream   Topical TID  . insulin aspart  0-15 Units Subcutaneous TID WC  . insulin aspart  0-5 Units Subcutaneous QHS  . levothyroxine  175 mcg Oral QAC breakfast  . metoprolol tartrate  25 mg Oral BID  . ramipril  2.5 mg Oral Daily  . sodium chloride flush  3 mL Intravenous Q12H  . torsemide  20 mg Oral Daily   Continuous Infusions:   Marzetta Board, MD, PhD [facility] Pager [phone]  If 7PM-7AM, please contact night-coverage [email or web address] Password [email or web address] 04/07/2016, 2:57 PM

## 2016-04-08 DIAGNOSIS — R1110 Vomiting, unspecified: Secondary | ICD-10-CM | POA: Diagnosis not present

## 2016-04-08 DIAGNOSIS — R197 Diarrhea, unspecified: Secondary | ICD-10-CM | POA: Diagnosis not present

## 2016-04-08 LAB — GLUCOSE, CAPILLARY
GLUCOSE-CAPILLARY: 174 mg/dL — AB (ref 65–99)
Glucose-Capillary: 187 mg/dL — ABNORMAL HIGH (ref 65–99)
Glucose-Capillary: 239 mg/dL — ABNORMAL HIGH (ref 65–99)

## 2016-04-08 MED ORDER — METOPROLOL TARTRATE 25 MG PO TABS
25.0000 mg | ORAL_TABLET | Freq: Two times a day (BID) | ORAL | 1 refills | Status: DC
Start: 2016-04-08 — End: 2016-04-16

## 2016-04-08 MED ORDER — METOPROLOL SUCCINATE ER 25 MG PO TB24: 50.0000 mg | ORAL_TABLET | Freq: Every day | ORAL | 2 refills | Status: DC

## 2016-04-08 NOTE — Clinical Social Work Note (Signed)
Per RNCM, patient in need of resources for her heating bill. CSW met with patient who stated that she and her husband had run out of gas for heat. The church had paid the last bill and it ran out day before yesterday. CSW provided list of financial resources. Patient also asked about getting Medicaid. CSW encouraged patient to apply online or in person at [facility]. No further concerns.  CSW signing off. Consult again if any other social work needs arise.  Dayton Scrape, [place]

## 2016-04-08 NOTE — Discharge Summary (Signed)
Physician Discharge Summary  Alexis [name] FBP:[id number] DOB: 1950/12/19 DOA: 04/05/2016  PCP: Mackie Pai, PA-C  Admit date: 04/05/2016 Discharge date: 04/08/2016  Admitted From: home Disposition:  home  Recommendations for Outpatient Follow-up:  1. Follow up with PCP in 1-2 weeks  Discharge Condition: stable CODE STATUS: DNR Diet recommendation: heart healthy  HPI: Per Dr. Lorin Mercy, Alexis [name] is a 65 y.o. female with medical history significant of CHF, afib on anticoagulation, CAD, and COPD presenting with n/v/d.  Also reports she is in afib with RVR.  Symptoms started at 0300 this AM.  Awoke feeling nauseated.  Ate something, concerned that sugar was low.  Went back to bed but then she started vomiting like crazy.  Started with diarrhea after that.  Maybe 10 episodes of vomiting.  Stools x 4-5 times.  Stools are loose, not watery.  Had antibiotics in December for respiratory infection.  No fever.  No sick contacts.  Chronic SOB and feels like her heart is racing chronically, but SOB is better right now so thinks her HR is better.   Concerned that her pacemaker is not acting right.  Would like to have consult for pacemaker calibration while here.  Hospital Course: Discharge Diagnoses:  Principal Problem:   Vomiting and diarrhea Active Problems:   Peripheral vascular disease (HCC)   Diabetes (HCC)   Atrial fibrillation with rapid ventricular response (HCC)   Hypothyroidism   Thrombocytopenia (HCC)   Persistent atrial fibrillation (HCC)   N/V/D - Likely viral gastroenteritis given acute onset and current presentation, GI pathogen panel tested positive for norovirus, her symptoms are improving, she is able to eat and her diarrhea has resolved. C. Diff negative.  Transaminitis - from acute illness, abd [place] with no signs of acute cholecystitis, LFTs improving Thrombocytopenia - review of records indicated chronic thrombocytopenia with Plt in 70 -90's, no signs of bleeding,  stable. Outpatient follow up Afib with RVR, on Eliquis - cardiology consulted and evaluated patient while hospitalized, patient with poorly controlled rates likely in the setting of #1, improved, her home Metoprolol was changed to 25 mg BID as below with improvement in her heart rates. Continue on discharge. Continue Eliquis. ChADS VASC score > 2. Pacemaker interrogated by EP. DM - using insulin pump at home, continue PVD - stable Hypothyroidism - Elevated TSH in 2/17, TSH 8.4, repeat TSH in 3 weeks as an outpatient Morbid obesity  - Body mass index is 42.74 kg/m.  Discharge Instructions   Allergies as of 04/08/2016      Reactions   Indomethacin Other (See Comments)   Renal Insufficiency   Pregabalin Other (See Comments)   Other reaction(s): DIZZINESS   Sulfa Antibiotics Hives   Sulfamethoxazole Hives      Medication List    STOP taking these medications   HYDROcodone-homatropine 5-1.5 MG/5ML syrup Commonly known as:  HYCODAN   metoprolol succinate 25 MG 24 hr tablet Commonly known as:  TOPROL-XL     TAKE these medications   albuterol (2.5 MG/3ML) 0.083% nebulizer solution Commonly known as:  PROVENTIL Take 3 mLs (2.5 mg total) by nebulization every 6 (six) hours as needed for wheezing or shortness of breath.   albuterol 108 (90 Base) MCG/ACT inhaler Commonly known as:  PROVENTIL HFA;VENTOLIN HFA Inhale 2 puffs into the lungs every 6 (six) hours as needed for wheezing or shortness of breath.   ALEVE 220 MG tablet Generic drug:  naproxen sodium Take 440 mg by mouth every 12 (twelve) hours.  ALEVE 220 MG tablet Generic drug:  naproxen sodium Take 440 mg by mouth 2 (two) times daily.   allopurinol 100 MG tablet Commonly known as:  ZYLOPRIM Take ONE (1) tablet by mouth once daily What changed:  how much to take  how to take this  when to take this  additional instructions   apixaban 5 MG Tabs tablet Commonly known as:  ELIQUIS Take 1 tablet (5 mg total) by  mouth 2 (two) times daily.   atorvastatin 10 MG tablet Commonly known as:  LIPITOR Take 1 tablet (10 mg total) by mouth daily.   azelastine 0.1 % nasal spray Commonly known as:  ASTELIN Place 2 sprays into both nostrils 2 (two) times daily. Use in each nostril as directed What changed:  when to take this  reasons to take this  additional instructions   B-D ULTRAFINE III SHORT PEN 31G X 8 MM Misc Generic drug:  Insulin Pen Needle Use for insulin pen up to five times daily E11.65   DULoxetine 30 MG capsule Commonly known as:  CYMBALTA Take 1 capsule (30 mg total) by mouth 2 (two) times daily. What changed:  when to take this   fluticasone 110 MCG/ACT inhaler Commonly known as:  FLOVENT HFA Inhale 2 puffs into the lungs 2 (two) times daily.   fluticasone 50 MCG/ACT nasal spray Commonly known as:  FLONASE Place 1 spray into both nostrils daily. What changed:  how much to take  how to take this  when to take this  additional instructions   GLUCOCOM BLOOD GLUCOSE MONITOR Devi Use to check blood sugars 6 times daily E11.65   levothyroxine 175 MCG tablet Commonly known as:  SYNTHROID, LEVOTHROID Take 175 mcg by mouth daily before breakfast.   metoprolol tartrate 25 MG tablet Commonly known as:  LOPRESSOR Take 1 tablet (25 mg total) by mouth 2 (two) times daily.   multivitamin-prenatal 27-0.8 MG Tabs tablet Take 1 tablet by mouth daily. Takes for nail and hair growth   ramipril 2.5 MG capsule Commonly known as:  ALTACE Take ONE (1)  capsule by mouth once daily What changed:  how much to take  how to take this  when to take this  additional instructions   spironolactone 25 MG tablet Commonly known as:  ALDACTONE Take ONE (1) tablet by mouth once daily What changed:  how much to take  how to take this  when to take this  additional instructions   torsemide 10 MG tablet Commonly known as:  DEMADEX Take 1 tablet (10 mg total) by mouth daily.     TRIPLE ANTIBIOTIC/LIDOCAINE EX Apply 1 application topically 2 (two) times daily.   V-GO 40 Kit Inject into the skin See admin instructions. Use with Novolog - 6 pumps before each meal      Follow-up Information    Will Meredith Leeds, MD Follow up on 06/09/2016.   Specialty:  Cardiology Why:  1:45PM Contact information: [place] [address] [phone]        Mackie Pai, [place]. Schedule an appointment as soon as possible for a visit in 2 week(s).   Specialties:  Internal Medicine, Family Medicine Contact information: [address] [place] [address] [phone]          Allergies  Allergen Reactions  . Indomethacin Other (See Comments)     Renal Insufficiency  . Pregabalin Other (See Comments)    Other reaction(s): DIZZINESS   .  Sulfa Antibiotics Hives  . Sulfamethoxazole Hives    Consultations:  Cardiology  EP  Procedures/Studies:  Dg Chest 2 View  Result Date: 04/07/2016 CLINICAL DATA:  Clinical improvement since getting the heart rate under control. History of CHF, COPD, atrial fibrillation. EXAM: CHEST  2 VIEW COMPARISON:  PA and lateral chest x-ray of February 15, 2016 FINDINGS: The lungs are adequately inflated. There is no focal infiltrate. There is chronic prominence of the perihilar soft tissues on the right. There is no pleural effusion. The cardiac silhouette is enlarged but stable. The pulmonary vascularity is not engorged. The mediastinum is normal in width. There is calcification in the wall of the aortic arch. The ICD is in stable position. There is mild degenerative disc disease of the thoracic spine. IMPRESSION: Chronic bronchitic changes. Mild perihilar interstitial prominence, stable. Stable cardiomegaly. Electronically Signed   By: David  Martinique M.D.   On: 04/07/2016 15:18   US Abdomen Limited Ruq  Result Date: 04/05/2016 CLINICAL DATA:  66 year old female with right upper quadrant  abdominal pain, nausea vomiting and diarrhea for 1 day. Hyperglycemia. Initial encounter. EXAM: US ABDOMEN LIMITED - RIGHT UPPER QUADRANT COMPARISON:  Right upper quadrant ultrasound 05/21/2014 and [facility] CT Abdomen and Pelvis 03/13/2010 FINDINGS: Gallbladder: Chronic cholelithiasis. Superimposed sludge. Today individual stones are estimated up to 1.9 cm. Gallbladder wall thickness remains normal at 2 mm. No sonographic Murphy sign elicited. No pericholecystic fluid. Common bile duct: Diameter: 2 mm, normal Liver: Chronically echogenic (image 36). Dense parenchyma somewhat difficult penetrate. No intrahepatic biliary ductal dilatation or discrete liver lesion identified. IMPRESSION: Chronic cholelithiasis and fatty liver disease. No evidence of acute cholecystitis or acute biliary obstruction. Electronically Signed   By: Genevie Ann M.D.   On: 04/05/2016 11:06      Subjective: - no chest pain, shortness of breath, no abdominal pain, nausea or vomiting.   Discharge Exam: Vitals:   04/08/16 0614 04/08/16 1230  BP: (!) 154/82 (!) 138/54  Pulse: 85 61  Resp: 18 18  Temp: 97.8 F (36.6 C) 97.9 F (36.6 C)   Vitals:   04/07/16 1143 04/07/16 2045 04/08/16 0614 04/08/16 1230  BP: (!) 111/56 137/69 (!) 154/82 (!) 138/54  Pulse: 93 73 85 61  Resp: _0 Temp: 98 F (36.7 C) 98.3 F (36.8 C) 97.8 F (36.6 C) 97.9 F (36.6 C)  TempSrc: Oral Oral Oral Oral  SpO2: 100% 99%  97%  Weight:   105.8 kg (233 lb 4.8 oz)   Height:        General: Pt is alert, awake, not in acute distress Cardiovascular: irregular, no rubs, no gallops Respiratory: CTA bilaterally, no wheezing, no rhonchi Abdominal: Soft, NT, ND, bowel sounds + Extremities: no edema, no cyanosis    The results of significant diagnostics from this hospitalization (including imaging, microbiology, ancillary and laboratory) are listed below for reference.     Microbiology: Recent Results (from the past 240  hour(s))  Urine culture     Status: Abnormal   Collection Time: 04/05/16  4:50 PM  Result Value Ref Range Status   Specimen Description URINE, CLEAN CATCH  Final   Special Requests Normal  Final   Culture MULTIPLE SPECIES PRESENT, SUGGEST RECOLLECTION (A)  Final   Report Status 04/07/2016 FINAL  Final  Culture, blood (routine x 2)     Status: None (Preliminary result)   Collection Time: 04/05/16  5:27 PM  Result Value Ref Range Status   Specimen Description  BLOOD LEFT HAND  Final   Special Requests   Final    BOTTLES DRAWN AEROBIC AND ANAEROBIC 5CC RED TOP 4CC BLUE   Culture   Final    NO GROWTH 3 DAYS Performed at [facility], [place] [address] [place], [place] [address]    Report Status PENDING  Incomplete  Culture, blood (routine x 2)     Status: None (Preliminary result)   Collection Time: 04/05/16  5:41 PM  Result Value Ref Range Status   Specimen Description BLOOD RIGHT HAND  Final   Special Requests IN PEDIATRIC BOTTLE 3CC  Final   Culture   Final    NO GROWTH 3 DAYS Performed at [facility], [place] [address] [place], [place] [address]    Report Status PENDING  Incomplete  Gastrointestinal Panel by PCR , Stool     Status: Abnormal   Collection Time: 04/05/16  9:44 PM  Result Value Ref Range Status   Campylobacter species NOT DETECTED NOT DETECTED Final   Plesimonas shigelloides NOT DETECTED NOT DETECTED Final   Salmonella species NOT DETECTED NOT DETECTED Final   Yersinia enterocolitica NOT DETECTED NOT DETECTED Final   Vibrio species NOT DETECTED NOT DETECTED Final   Vibrio cholerae NOT DETECTED NOT DETECTED Final   Enteroaggregative E coli (EAEC) NOT DETECTED NOT DETECTED Final   Enteropathogenic E coli (EPEC) NOT DETECTED NOT DETECTED Final   Enterotoxigenic E coli (ETEC) NOT DETECTED NOT DETECTED Final   Shiga like toxin producing E coli (STEC) NOT DETECTED NOT DETECTED Final   Shigella/Enteroinvasive E coli (EIEC) NOT DETECTED NOT DETECTED Final    Cryptosporidium NOT DETECTED NOT DETECTED Final   Cyclospora cayetanensis NOT DETECTED NOT DETECTED Final   Entamoeba histolytica NOT DETECTED NOT DETECTED Final   Giardia lamblia NOT DETECTED NOT DETECTED Final   Adenovirus F40/41 NOT DETECTED NOT DETECTED Final   Astrovirus NOT DETECTED NOT DETECTED Final   Norovirus GI/GII DETECTED (A) NOT DETECTED Final    Comment: CRITICAL RESULT CALLED TO, READ BACK BY AND VERIFIED WITH: CHRISTINA VILLANUEVA AT 1913 ON 04/06/2016 JLJ    Rotavirus A NOT DETECTED NOT DETECTED Final   Sapovirus (I, II, IV, and V) NOT DETECTED NOT DETECTED Final  C difficile quick scan w PCR reflex     Status: None   Collection Time: 04/05/16  9:44 PM  Result Value Ref Range Status   C Diff antigen NEGATIVE NEGATIVE Final   C Diff toxin NEGATIVE NEGATIVE Final   C Diff interpretation No C. difficile detected.  Final     Labs: BNP (last 3 results)  Recent Labs  01/27/16 2025 02/15/16 1340 04/06/16 1625  BNP 40.1 58.1 88.8   Basic Metabolic Panel:  Recent Labs Lab 04/05/16 0923 04/06/16 0157 04/07/16 0542  NA 138 135 135  K 4.7 4.0 4.3  CL 97* 102 99*  CO2 29 22 21*  GLUCOSE 335* 213* 215*  BUN 34* 30* 21*  CREATININE 0.93 0.90 0.84  CALCIUM 10.0 8.3* 8.2*   Liver Function Tests:  Recent Labs Lab 04/05/16 0923 04/07/16 0542  AST 85* 83*  ALT 70* 58*  ALKPHOS 155* 135*  BILITOT 1.3* 1.0  PROT 8.9* 7.2  ALBUMIN 4.3 3.5    Recent Labs Lab 04/05/16 0923  LIPASE 49   No results for input(s): AMMONIA in the last 168 hours. CBC:  Recent Labs Lab 04/05/16 0923 04/06/16 0157 04/07/16 0542  WBC 5.7 3.6* 6.8  HGB 16.6* 12.9 13.9  HCT 48.7* 38.4 42.0  MCV 97.6 98.7 100.5*  PLT 92* 74* 93*   Cardiac Enzymes: No results for input(s): CKTOTAL, CKMB, CKMBINDEX, TROPONINI in the last 168 hours. BNP: Invalid input(s): POCBNP CBG:  Recent Labs Lab 04/07/16 1643 04/07/16 2043 04/08/16 0218 04/08/16 0734 04/08/16 1147  GLUCAP 184*  196* 174* 187* 239*   D-Dimer No results for input(s): DDIMER in the last 72 hours. Hgb A1c No results for input(s): HGBA1C in the last 72 hours. Lipid Profile No results for input(s): CHOL, HDL, LDLCALC, TRIG, CHOLHDL, LDLDIRECT in the last 72 hours. Thyroid function studies  Recent Labs  04/06/16 0157  TSH 8.468*   Anemia work up No results for input(s): VITAMINB12, FOLATE, FERRITIN, TIBC, IRON, RETICCTPCT in the last 72 hours. Urinalysis    Component Value Date/Time   COLORURINE YELLOW 04/05/2016 [place] 04/05/2016 1203   LABSPEC 1.022 04/05/2016 1203   PHURINE 6.0 04/05/2016 1203   GLUCOSEU 250 (A) 04/05/2016 1203   HGBUR NEGATIVE 04/05/2016 [place] 04/05/2016 [place] 04/05/2016 1203   PROTEINUR NEGATIVE 04/05/2016 1203   NITRITE NEGATIVE 04/05/2016 1203   LEUKOCYTESUR TRACE (A) 04/05/2016 1203   Sepsis Labs Invalid input(s): PROCALCITONIN,  WBC,  LACTICIDVEN Microbiology Recent Results (from the past 240 hour(s))  Urine culture     Status: Abnormal   Collection Time: 04/05/16  4:50 PM  Result Value Ref Range Status   Specimen Description URINE, CLEAN CATCH  Final   Special Requests Normal  Final   Culture MULTIPLE SPECIES PRESENT, SUGGEST RECOLLECTION (A)  Final   Report Status 04/07/2016 FINAL  Final  Culture, blood (routine x 2)     Status: None (Preliminary result)   Collection Time: 04/05/16  5:27 PM  Result Value Ref Range Status   Specimen Description BLOOD LEFT HAND  Final   Special Requests   Final    BOTTLES DRAWN AEROBIC AND ANAEROBIC 5CC RED TOP 4CC BLUE   Culture   Final    NO GROWTH 3 DAYS Performed at [facility], [place] [address], [place], [place] [address]    Report Status PENDING  Incomplete  Culture, blood (routine x 2)     Status: None (Preliminary result)   Collection Time: 04/05/16  5:41 PM  Result Value Ref Range Status   Specimen Description BLOOD RIGHT HAND  Final    Special Requests IN PEDIATRIC BOTTLE 3CC  Final   Culture   Final    NO GROWTH 3 DAYS Performed at [facility], [place] [address], [place], [place] [address]    Report Status PENDING  Incomplete  Gastrointestinal Panel by PCR , Stool     Status: Abnormal   Collection Time: 04/05/16  9:44 PM  Result Value Ref Range Status   Campylobacter species NOT DETECTED NOT DETECTED Final   Plesimonas shigelloides NOT DETECTED NOT DETECTED Final   Salmonella species NOT DETECTED NOT DETECTED Final   Yersinia enterocolitica NOT DETECTED NOT DETECTED Final   Vibrio species NOT DETECTED NOT DETECTED Final   Vibrio cholerae NOT DETECTED NOT DETECTED Final   Enteroaggregative E coli (EAEC) NOT DETECTED NOT DETECTED Final   Enteropathogenic E coli (EPEC) NOT DETECTED NOT DETECTED Final   Enterotoxigenic E coli (ETEC) NOT DETECTED NOT DETECTED Final   Shiga like toxin producing E coli (STEC) NOT DETECTED NOT DETECTED Final   Shigella/Enteroinvasive E coli (EIEC) NOT DETECTED NOT DETECTED Final   Cryptosporidium NOT  DETECTED NOT DETECTED Final   Cyclospora cayetanensis NOT DETECTED NOT DETECTED Final   Entamoeba histolytica NOT DETECTED NOT DETECTED Final   Giardia lamblia NOT DETECTED NOT DETECTED Final   Adenovirus F40/41 NOT DETECTED NOT DETECTED Final   Astrovirus NOT DETECTED NOT DETECTED Final   Norovirus GI/GII DETECTED (A) NOT DETECTED Final    Comment: CRITICAL RESULT CALLED TO, READ BACK BY AND VERIFIED WITH: CHRISTINA VILLANUEVA AT 1913 ON 04/06/2016 JLJ    Rotavirus A NOT DETECTED NOT DETECTED Final   Sapovirus (I, II, IV, and V) NOT DETECTED NOT DETECTED Final  C difficile quick scan w PCR reflex     Status: None   Collection Time: 04/05/16  9:44 PM  Result Value Ref Range Status   C Diff antigen NEGATIVE NEGATIVE Final   C Diff toxin NEGATIVE NEGATIVE Final   C Diff interpretation No C. difficile detected.  Final     Time coordinating discharge: Over 30  minutes  SIGNED:  Marzetta Board, MD  [facility] 04/08/2016, 3:20 PM Pager [phone]  If 7PM-7AM, please contact night-coverage [email or web address] Password [email or web address]

## 2016-04-08 NOTE — Discharge Instructions (Signed)
Follow with Saguier, Percell Miller, PA-C in 5-7 days  Please get a complete blood count and chemistry panel checked by your Primary MD at your next visit, and again as instructed by your Primary MD. Please get your medications reviewed and adjusted by your Primary MD.  Please request your Primary MD to go over all Hospital Tests and Procedure/Radiological results at the follow up, please get all Hospital records sent to your Prim MD by signing hospital release before you go home.  If you had Pneumonia of Lung problems at the Hospital: Please get a 2 view Chest X ray done in 6-8 weeks after hospital discharge or sooner if instructed by your Primary MD.  If you have Congestive Heart Failure: Please call your Cardiologist or Primary MD anytime you have any of the following symptoms:  1) 3 pound weight gain in 24 hours or 5 pounds in 1 week  2) shortness of breath, with or without a dry hacking cough  3) swelling in the hands, feet or stomach  4) if you have to sleep on extra pillows at night in order to breathe  Follow cardiac low salt diet and 1.5 lit/day fluid restriction.  If you have diabetes Accuchecks 4 times/day, Once in AM empty stomach and then before each meal. Log in all results and show them to your primary doctor at your next visit. If any glucose reading is under 80 or above 300 call your primary MD immediately.  If you have Seizure/Convulsions/Epilepsy: Please do not drive, operate heavy machinery, participate in activities at heights or participate in high speed sports until you have seen by Primary MD or a Neurologist and advised to do so again.  If you had Gastrointestinal Bleeding: Please ask your Primary MD to check a complete blood count within one week of discharge or at your next visit. Your endoscopic/colonoscopic biopsies that are pending at the time of discharge, will also need to followed by your Primary MD.  Get Medicines reviewed and adjusted. Please take all your  medications with you for your next visit with your Primary MD  Please request your Primary MD to go over all hospital tests and procedure/radiological results at the follow up, please ask your Primary MD to get all Hospital records sent to his/her office.  If you experience worsening of your admission symptoms, develop shortness of breath, life threatening emergency, suicidal or homicidal thoughts you must seek medical attention immediately by calling 911 or calling your MD immediately  if symptoms less severe.  You must read complete instructions/literature along with all the possible adverse reactions/side effects for all the Medicines you take and that have been prescribed to you. Take any new Medicines after you have completely understood and accpet all the possible adverse reactions/side effects.   Do not drive or operate heavy machinery when taking Pain medications.   Do not take more than prescribed Pain, Sleep and Anxiety Medications  Special Instructions: If you have smoked or chewed Tobacco  in the last 2 yrs please stop smoking, stop any regular Alcohol  and or any Recreational drug use.  Wear Seat belts while driving.  Please note You were cared for by a hospitalist during your hospital stay. If you have any questions about your discharge medications or the care you received while you were in the hospital after you are discharged, you can call the unit and asked to speak with the hospitalist on call if the hospitalist that took care of you is not available. Once  you are discharged, your primary care physician will handle any further medical issues. Please note that NO REFILLS for any discharge medications will be authorized once you are discharged, as it is imperative that you return to your primary care physician (or establish a relationship with a primary care physician if you do not have one) for your aftercare needs so that they can reassess your need for medications and monitor your  lab values.  You can reach the hospitalist office at phone [phone] or fax [phone]   If you do not have a primary care physician, you can call [phone] for a physician referral.  Activity: As tolerated with Full fall precautions use walker/cane & assistance as needed  Diet: heart healthy  Disposition Home

## 2016-04-09 ENCOUNTER — Telehealth: Payer: Self-pay | Admitting: Behavioral Health

## 2016-04-09 ENCOUNTER — Encounter (HOSPITAL_COMMUNITY): Payer: Self-pay | Admitting: Internal Medicine

## 2016-04-09 NOTE — Telephone Encounter (Signed)
Unable to reach patient at the time of TCM/Hospital Follow-up call. Left message for patient to return call when available.

## 2016-04-10 ENCOUNTER — Telehealth: Payer: Self-pay | Admitting: Medical

## 2016-04-10 LAB — CULTURE, BLOOD (ROUTINE X 2)
CULTURE: NO GROWTH
Culture: NO GROWTH

## 2016-04-10 NOTE — Telephone Encounter (Signed)
Almyra Free called from Triage called about the patient refusing to go to the ED. The patient also refused go to the Saturday Clinic at [facility]. The patient called 911 with complaints of swelling face, feet and hands. The patient has gained 11 lbs in a week and also has congestive heart failure. Triage is faxing the note to the [place] office since they can't put notes in the patients chart on the weekend.

## 2016-04-10 NOTE — Telephone Encounter (Signed)
If she has facial swelling along with feet and hand swelling, with the weight gain, needs ER eval.  I didn't send any meds in.   Thanks.

## 2016-04-10 NOTE — Telephone Encounter (Signed)
Patient stated she will not go to ED or [facility] to be seen.  Also patient was just placed on metoprlol and stated she only wants her dieretic.  Please advise.

## 2016-04-10 NOTE — Telephone Encounter (Signed)
Patient would also like a refil of her dieretic. Please advise.

## 2016-04-12 ENCOUNTER — Encounter: Payer: Self-pay | Admitting: Medical

## 2016-04-12 ENCOUNTER — Telehealth: Payer: Self-pay

## 2016-04-12 ENCOUNTER — Ambulatory Visit: Payer: Self-pay | Admitting: Medical

## 2016-04-12 NOTE — Telephone Encounter (Signed)
Follow up call made to patient. No answer. Left message for patient to call back

## 2016-04-13 NOTE — Telephone Encounter (Addendum)
Follow up call made to patient . States she called [facility] on 04/10/16 with swelling of feet,hands, and face. States she was unable to locate her diuretic at the time. She has since found it  and the swelling has gone down, States she is feeling much better now. States the diuretic she is taking is Torisimide 20 mg. States it was prescribed by another Dr.

## 2016-04-13 NOTE — Telephone Encounter (Signed)
Or put her in 10:15-10:45 slots.

## 2016-04-13 NOTE — Telephone Encounter (Signed)
Pt is hospital follow up. I think on Thursday. Very complicated admission. Right before lunch at 11:30. Prefer she be seen earlier at 9:45. If crazy morning and I am half way into lunch I don't want to be rushed with her.

## 2016-04-15 ENCOUNTER — Encounter: Payer: Self-pay | Admitting: Medical

## 2016-04-15 ENCOUNTER — Ambulatory Visit (INDEPENDENT_AMBULATORY_CARE_PROVIDER_SITE_OTHER): Payer: Medicare HMO | Admitting: Medical

## 2016-04-15 VITALS — BP 117/59 | HR 93 | Temp 98.2°F | Ht 62.0 in | Wt 227.6 lb

## 2016-04-15 DIAGNOSIS — L988 Other specified disorders of the skin and subcutaneous tissue: Secondary | ICD-10-CM | POA: Diagnosis not present

## 2016-04-15 DIAGNOSIS — E119 Type 2 diabetes mellitus without complications: Secondary | ICD-10-CM | POA: Diagnosis not present

## 2016-04-15 DIAGNOSIS — R197 Diarrhea, unspecified: Secondary | ICD-10-CM

## 2016-04-15 DIAGNOSIS — I4891 Unspecified atrial fibrillation: Secondary | ICD-10-CM

## 2016-04-15 NOTE — Telephone Encounter (Signed)
Pt states she is on metoprol er succinate 25 mg a day. She is not taking the mettoprol tartrate bid. She states bid causes side effect but taking metoprol er no side effects. So will you update her med list.

## 2016-04-15 NOTE — Patient Instructions (Signed)
For your severe peeling of rt foot skin on toes will refer you to wound management but also keep your appointment with vascular surgeon. Reschedule or call them back and go today.   If area worsens before wound care visit let me know.  Let me know what b-blocker you are on now without any reaction or side effect. Follow up with cardiologist for pacemaker check.  See endocrinologist and continue v-go.  Follow up with me in 3 wks or as needed

## 2016-04-15 NOTE — Progress Notes (Signed)
Pre visit review using our clinic review tool, if applicable. No additional management support is needed unless otherwise documented below in the visit note. 

## 2016-04-15 NOTE — Progress Notes (Signed)
Subjective:    Patient ID: [name], female    DOB: 1950/05/17, 66 y.o.   MRN: [id number]  HPI  Pt in for follow up from the hospitalization.   Pt was admitted to the hospital for episodes of vomiting and losses stools. She was diagnosed with nora virus. Eventually after in hospital treatment her diarrhea resolved. She was discharged and now doing well with no diarrhea.  Pt also had a fib while in hospital. But then it eventually resolved. Pt has follow up with heart doctor to re-evaluate her pacemaker on April 12th. Pt states that metoprol tartrate 25 mg twice daily has made her swelling and drowsy. But she states she had old b blocker at house that she is using and she does not get swelling nor does it make her fatigue.  Pt had high sugars before she went to ED. She ran out of her insulin before being hospitalized. But now back on per pt and doing well with no hyperglycemia. She has v-go.  Pt has peeling of her rt foot toes for 3 weeks. Pt went to podiatrist. He states unsure why she had this but referred to vascular surgeon. But pt canceled appointment today stating she can't make appointment. Pt wants to be referred to wound management. Pt states this came up after using voltaren gel. Pt thinks chemical burn from gel?      Review of Systems  Constitutional: Negative for chills, fatigue and fever.  Respiratory: Negative for cough, chest tightness, shortness of breath and wheezing.   Cardiovascular: Negative for chest pain and palpitations.  Gastrointestinal: Negative for abdominal pain, diarrhea, nausea and vomiting.  Genitourinary: Negative for dysuria.  Musculoskeletal: Negative for back pain.  Skin: Positive for rash.       See hpi and foot exam.  Neurological: Negative for dizziness and headaches.  Hematological: Negative for adenopathy. Does not bruise/bleed easily.  Psychiatric/Behavioral: Negative for behavioral problems and confusion.    Past Medical History:    Diagnosis Date  . Allergy   . Anxiety   . Arthritis   . Asthma   . Atrial fibrillation ([place])   . CHF (congestive heart failure) ([place])   . COPD (chronic obstructive pulmonary disease) ([place])   . Diabetes mellitus without complication ([place])   . Hyperlipidemia   . Hypertension   . Myocardial infarction    several  . Pacemaker    CRT-P with RV lead and His Bundle lead ( high threshold)   . Peripheral neuropathy ([place])   . Thyroid disease      Social History   Social History  . Marital status: Married    Spouse name: N/A  . Number of children: N/A  . Years of education: N/A   Occupational History  . retired    Social History Main Topics  . Smoking status: Former Smoker    Quit date: 03/01/1998  . Smokeless tobacco: Never Used  . 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  . Drug use: No     Comment: remote use  . Sexual activity: Not on file   Other Topics Concern  . Not on file   Social History Narrative  . No narrative on file    Past Surgical History:  Procedure Laterality Date  . RADIOFREQUENCY ABLATION    . TOTAL KNEE ARTHROPLASTY      Family History  Problem Relation Age of Onset  . Diabetes Mother   .  Hyperlipidemia Mother   . Diabetes Father   . Hyperlipidemia Father   . Heart disease Father     before age 38  . Heart attack Father   . Diabetes Brother   . Hyperlipidemia Brother   . Heart attack Brother     Allergies  Allergen Reactions  . Indomethacin Other (See Comments)     Renal Insufficiency  . Pregabalin Other (See Comments)    Other reaction(s): DIZZINESS   . Sulfa Antibiotics Hives  . Sulfamethoxazole Hives    Current Outpatient Prescriptions on File Prior to Visit  Medication Sig Dispense Refill  . albuterol (PROVENTIL HFA;VENTOLIN HFA) 108 (90 Base) MCG/ACT inhaler Inhale 2 puffs into the lungs every 6 (six) hours as needed for wheezing or shortness of breath. 1 Inhaler 2  . albuterol  (PROVENTIL) (2.5 MG/3ML) 0.083% nebulizer solution Take 3 mLs (2.5 mg total) by nebulization every 6 (six) hours as needed for wheezing or shortness of breath. 150 mL 1  . allopurinol (ZYLOPRIM) 100 MG tablet Take ONE (1) tablet by mouth once daily (Patient taking differently: Take 100 mg by mouth at bedtime. ) 90 tablet 3  . apixaban (ELIQUIS) 5 MG TABS tablet Take 1 tablet (5 mg total) by mouth 2 (two) times daily. 60 tablet 6  . atorvastatin (LIPITOR) 10 MG tablet Take 1 tablet (10 mg total) by mouth daily. (Patient not taking: Reported on 04/05/2016) 30 tablet 0  . azelastine (ASTELIN) 0.1 % nasal spray Place 2 sprays into both nostrils 2 (two) times daily. Use in each nostril as directed (Patient taking differently: Place 2 sprays into both nostrils 2 (two) times daily as needed for rhinitis or allergies. Use in each nostril as directed) 30 mL 3  . Blood Glucose Monitoring Suppl (GLUCOCOM BLOOD GLUCOSE MONITOR) DEVI Use to check blood sugars 6 times daily E11.65    . DULoxetine (CYMBALTA) 30 MG capsule Take 1 capsule (30 mg total) by mouth 2 (two) times daily. (Patient taking differently: Take 30 mg by mouth at bedtime. ) 60 capsule 1  . fluticasone (FLONASE) 50 MCG/ACT nasal spray Place 1 spray into both nostrils daily. (Patient taking differently: Place 1 spray into both nostrils at bedtime. Place 1 spray into both nostrils daily.) 16 g 3  . fluticasone (FLOVENT HFA) 110 MCG/ACT inhaler Inhale 2 puffs into the lungs 2 (two) times daily. 1 Inhaler 3  . Insulin Disposable Pump (V-GO 40) KIT Inject into the skin See admin instructions. Use with Novolog - 6 pumps before each meal    . Insulin Pen Needle (B-D ULTRAFINE III SHORT PEN) 31G X 8 MM MISC Use for insulin pen up to five times daily E11.65    . levothyroxine (SYNTHROID, LEVOTHROID) 175 MCG tablet Take 175 mcg by mouth daily before breakfast.    . metoprolol tartrate (LOPRESSOR) 25 MG tablet Take 1 tablet (25 mg total) by mouth 2 (two) times  daily. 60 tablet 1  . naproxen sodium (ALEVE) 220 MG tablet Take 440 mg by mouth every 12 (twelve) hours.    . naproxen sodium (ALEVE) 220 MG tablet Take 440 mg by mouth 2 (two) times daily.    . Neo-Bacit-Poly-Lidocaine (TRIPLE ANTIBIOTIC/LIDOCAINE EX) Apply 1 application topically 2 (two) times daily.    . Prenatal Vit-Fe Fumarate-FA (MULTIVITAMIN-PRENATAL) 27-0.8 MG TABS tablet Take 1 tablet by mouth daily. Takes for nail and hair growth     . ramipril (ALTACE) 2.5 MG capsule Take ONE (1)  capsule by mouth once daily (  Patient taking differently: Take 2.5 mg by mouth at bedtime. ) 90 capsule 1  . spironolactone (ALDACTONE) 25 MG tablet Take ONE (1) tablet by mouth once daily (Patient taking differently: Take 25 mg by mouth daily. ) 90 tablet 1  . torsemide (DEMADEX) 10 MG tablet Take 1 tablet (10 mg total) by mouth daily. 90 tablet 1   No current facility-administered medications on file prior to visit.     BP (!) 117/59   Pulse 93   Temp 98.2 F (36.8 C) (Oral)   Ht 5' 2"  (1.575 m)   Wt 227 lb 9.6 oz (103.2 kg)   SpO2 98%   BMI 41.63 kg/m       Objective:   Physical Exam  General Mental Status- Alert. General Appearance- Not in acute distress.   Skin General: the epidermal layer of toes on right foot look peeled. No yellow dc. No swelling. No warmth. The great toe area has weeping appearance, other toes look dry.  Neck Carotid Arteries- Normal color. Moisture- Normal Moisture. No carotid bruits. No JVD.  Chest and Lung Exam Auscultation: Breath Sounds:-Normal.  Cardiovascular Auscultation:Rythm- Regular. Murmurs & Other Heart Sounds:Auscultation of the heart reveals- No Murmurs.  Abdomen Inspection:-Inspeection Normal. Palpation/Percussion:Note:No mass. Palpation and Percussion of the abdomen reveal- Non Tender, Non Distended + BS, no rebound or guarding.   Neurologic Cranial Nerve exam:- CN III-XII intact(No nystagmus), symmetric smile. Strength:- 5/5 equal and  symmetric strength both upper and lower extremities.     Assessment & Plan:  For your severe peeling of rt foot skin on toes will refer you to wound management but also keep your appointment with vascular surgeon. Reschedule or call them back and go today.   If area worsens before wound care visit let me know.  Let me know what b-blocker you are on now without any reaction or side effect. Follow up with cardiologist for pacemaker check.  See endocrinologist and continue v-go.  Follow up with me in 3 wks or as needed  Caasi Giglia, Percell Miller, [employer]

## 2016-04-16 ENCOUNTER — Other Ambulatory Visit: Payer: Self-pay | Admitting: Medical

## 2016-04-16 NOTE — Telephone Encounter (Signed)
Pt seen 04/15/16 at 1130.

## 2016-04-16 NOTE — Telephone Encounter (Signed)
Med list updated

## 2016-04-20 ENCOUNTER — Encounter: Payer: Medicare HMO | Attending: Internal Medicine | Admitting: Internal Medicine

## 2016-04-20 DIAGNOSIS — I4891 Unspecified atrial fibrillation: Secondary | ICD-10-CM | POA: Diagnosis not present

## 2016-04-20 DIAGNOSIS — E785 Hyperlipidemia, unspecified: Secondary | ICD-10-CM | POA: Insufficient documentation

## 2016-04-20 DIAGNOSIS — E1142 Type 2 diabetes mellitus with diabetic polyneuropathy: Secondary | ICD-10-CM | POA: Diagnosis not present

## 2016-04-20 DIAGNOSIS — L97519 Non-pressure chronic ulcer of other part of right foot with unspecified severity: Secondary | ICD-10-CM | POA: Diagnosis not present

## 2016-04-20 DIAGNOSIS — F419 Anxiety disorder, unspecified: Secondary | ICD-10-CM | POA: Insufficient documentation

## 2016-04-20 DIAGNOSIS — B353 Tinea pedis: Secondary | ICD-10-CM | POA: Diagnosis not present

## 2016-04-20 DIAGNOSIS — Z87891 Personal history of nicotine dependence: Secondary | ICD-10-CM | POA: Insufficient documentation

## 2016-04-20 DIAGNOSIS — I509 Heart failure, unspecified: Secondary | ICD-10-CM | POA: Insufficient documentation

## 2016-04-20 DIAGNOSIS — Z6841 Body Mass Index (BMI) 40.0 and over, adult: Secondary | ICD-10-CM | POA: Insufficient documentation

## 2016-04-20 DIAGNOSIS — J449 Chronic obstructive pulmonary disease, unspecified: Secondary | ICD-10-CM | POA: Diagnosis not present

## 2016-04-20 DIAGNOSIS — I252 Old myocardial infarction: Secondary | ICD-10-CM | POA: Diagnosis not present

## 2016-04-20 DIAGNOSIS — Z794 Long term (current) use of insulin: Secondary | ICD-10-CM | POA: Insufficient documentation

## 2016-04-20 DIAGNOSIS — L97518 Non-pressure chronic ulcer of other part of right foot with other specified severity: Secondary | ICD-10-CM | POA: Insufficient documentation

## 2016-04-20 DIAGNOSIS — L539 Erythematous condition, unspecified: Secondary | ICD-10-CM | POA: Diagnosis not present

## 2016-04-20 DIAGNOSIS — E669 Obesity, unspecified: Secondary | ICD-10-CM | POA: Diagnosis not present

## 2016-04-20 DIAGNOSIS — M1990 Unspecified osteoarthritis, unspecified site: Secondary | ICD-10-CM | POA: Insufficient documentation

## 2016-04-20 DIAGNOSIS — E1151 Type 2 diabetes mellitus with diabetic peripheral angiopathy without gangrene: Secondary | ICD-10-CM | POA: Diagnosis not present

## 2016-04-20 DIAGNOSIS — I110 Hypertensive heart disease with heart failure: Secondary | ICD-10-CM | POA: Diagnosis not present

## 2016-04-20 DIAGNOSIS — M109 Gout, unspecified: Secondary | ICD-10-CM | POA: Diagnosis not present

## 2016-04-20 DIAGNOSIS — E1140 Type 2 diabetes mellitus with diabetic neuropathy, unspecified: Secondary | ICD-10-CM | POA: Diagnosis not present

## 2016-04-20 DIAGNOSIS — S91301A Unspecified open wound, right foot, initial encounter: Secondary | ICD-10-CM | POA: Diagnosis not present

## 2016-04-21 ENCOUNTER — Encounter: Payer: Self-pay | Admitting: Medical

## 2016-04-21 NOTE — Progress Notes (Addendum)
LORAY, AKARD ([id number]) Visit Report for 04/20/2016 Chief Complaint Document Details Patient Name: Alexis [name], Alexis [name]. Date of Service: 04/20/2016 9:45 AM Medical Record Patient Account Number: [id number] Number: Treating RN: Ahmed Prima 01/18/1951 (66 y.o. Other Clinician: Date of Birth/Sex: Female) Treating Eufelia Veno Primary Care Provider: Mackie Pai Provider/Extender: G Referring Provider: Mackie Pai Weeks in Treatment: 0 Information Obtained from: Patient Chief Complaint 04/20/16; the patient is here for problems with her toes on the right foot. Electronic Signature(s) Signed: 04/21/2016 9:58:44 AM By: Linton Ham MD Entered By: Linton Ham on 04/20/2016 12:30:39 Alexis [name] ([id number]) -------------------------------------------------------------------------------- HPI Details Patient Name: Alexis [name] Date of Service: 04/20/2016 9:45 AM Medical Record Patient Account Number: [id number] Number: Treating RN: Ahmed Prima 03/03/1950 (66 y.o. Other Clinician: Date of Birth/Sex: Female) Treating Shia Delaine Primary Care Provider: Mackie Pai Provider/Extender: G Referring Provider: Mackie Pai Weeks in Treatment: 0 History of Present Illness HPI Description: 04/20/16; the patient tells me that a little over 3 weeks ago she applied faltering cream to her BILATERAL toes hoping to relieve pain of diabetic neuropathy in her feet. He days later she was picking some material off that she thought was dried ointment and it actually was denuding skin. Skin have literally peeled off her toes and the toes became very erythematous and painful. She was referred to her diet wrist he wasn't sure of the diagnosis. Noted difficulty feeling the pedal pulses in the right foot and referred her to vascular surgery for Doppler imaging however she has canceled this appointment and asked that they've week rebook her.  Since there is toes became irritated and painful she has been using a topical anesthetic anesthetic [ACD cream] and triple antibiotic cream. She has not gotten any relief. She is here for our review of this issue. The patient is a type II diabetic with an insulin pump. Staff could not get ABIs in our clinic as the Doppler signal was inaudible however she did have arterial Dopplers in February 2015. This showed an ABI on the right at 1.10 and a TBI on the right of greater than 0.7. The left ABI was 1.04 and the left TBI was greater than 0.7. Both of these are within normal limits. Electronic Signature(s) Signed: 04/21/2016 9:58:44 AM By: Linton Ham MD Entered By: Linton Ham on 04/20/2016 12:51:24 Alexis [name] ([id number]) -------------------------------------------------------------------------------- Physical Exam Details Patient Name: Alexis [name] Date of Service: 04/20/2016 9:45 AM Medical Record Patient Account Number: [id number] Number: Treating RN: Ahmed Prima Apr 29, 1950 (66 y.o. Other Clinician: Date of Birth/Sex: Female) Treating Elmire Amrein Primary Care Provider: Mackie Pai Provider/Extender: G Referring Provider: Mackie Pai Weeks in Treatment: 0 Constitutional Sitting or standing Blood Pressure is within target range for patient.. Pulse regular and within target range for patient.Marland Kitchen Respirations regular, non-labored and within target range.. Temperature is normal and within the target range for the patient.. Patient's appearance is neat and clean. Appears in no acute distress. Well nourished and well developed.Marland Kitchen Respiratory Respiratory effort is easy and symmetric bilaterally. Rate is normal at rest and on room air.. Bilateral breath sounds are clear and equal in all lobes with no wheezes, rales or rhonchi.. Cardiovascular Heart rate is a regular no murmurs JVP is not elevated.. I had difficulty feeling her femoral pulses  through her pants. The left dorsalis pedis pulse was faintly palpable. The right was not palpable. However I believe I could feel a right posterior tibial pulse and a right popliteal pulse. Her forefoot  is cold right greater than left. There is also blanching.. Gastrointestinal (GI) Obese no masses noted. Lymphatic Nonpalpable in the right popliteal or inguinal area. Neurological Markedly reduced vibration sense to her mid calves also light touch.. Psychiatric No evidence of depression, anxiety, or agitation. Calm, cooperative, and communicative. Appropriate interactions and affect.. Notes Wound exam; limited to the toes. The toes are erythematous and almost looked "scalded" the epithelium is denuded on the dorsal and ventral surfaces and between the toes. Also between the toes there is macerated-looking tissue somewhat reminiscent of tinea pedis. Although she states she put the Voltaren gel on both feet there is nothing similar on the left foot. This leads me to believe that this is not the etiology of this problem Electronic Signature(s) Signed: 04/21/2016 9:58:44 AM By: Linton Ham MD Entered By: Linton Ham on 04/20/2016 12:54:54 Alexis [name] ([id number]) -------------------------------------------------------------------------------- Physician Orders Details Patient Name: Alexis [name] Date of Service: 04/20/2016 9:45 AM Medical Record Patient Account Number: [id number] Number: Treating RN: Ahmed Prima [birth year] (66 y.o. Other Clinician: Date of Birth/Sex: Female) Treating Exzavier Ruderman Primary Care Provider: Mackie Pai Provider/Extender: G Referring Provider: Mal Misty in Treatment: 0 Verbal / Phone Orders: Yes Clinician: Pinkerton, Debi Read Back and Verified: Yes Diagnosis Coding Wound Cleansing Wound #1 Right,Distal,Circumferential Foot o Clean wound with wound cleanser. o Cleanse wound with mild soap and  water Anesthetic Wound #1 Right,Distal,Circumferential Foot o Topical Lidocaine 4% cream applied to wound bed prior to debridement Primary Wound Dressing Wound #1 Right,Distal,Circumferential Foot o Aquacel Ag o Other: - nystatin cream Secondary Dressing Wound #1 Right,Distal,Circumferential Foot o Gauze and Kerlix/Conform o Other - netting Dressing Change Frequency Wound #1 Right,Distal,Circumferential Foot o Change dressing every day. Follow-up Appointments Wound #1 Right,Distal,Circumferential Foot o Return Appointment in 1 week. Edema Control Wound #1 Right,Distal,Circumferential Foot o Elevate legs to the level of the heart and pump ankles as often as possible Additional Orders / Instructions Wound #1 Right,Distal,Circumferential Foot Alexis [name], Alexis S. ([id number]) o Increase protein intake. Medications-please add to medication list. Wound #1 Right,Distal,Circumferential Foot o Other: - vitamin c, zinc, MVI Consults o Vascular - Dr. Fletcher Anon Services and Therapies o Arterial Studies- Bilateral - Dr. Fletcher Anon Electronic Signature(s) Signed: 04/20/2016 4:45:40 PM By: Alric Quan Signed: 04/21/2016 9:58:44 AM By: Linton Ham MD Entered By: Alric Quan on 04/20/2016 15:11:17 Alexis [name] ([id number]) -------------------------------------------------------------------------------- Problem List Details Patient Name: Alexis [name], Alexis [name]. Date of Service: 04/20/2016 9:45 AM Medical Record Patient Account Number: [id number] Number: Treating RN: Ahmed Prima Jan 20, 1951 (65 y.o. 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 Yes other  specified severity B35.3 Tinea pedis 04/20/2016 Yes E11.42 Type 2 diabetes mellitus with diabetic polyneuropathy 04/20/2016 Yes E11.51 Type 2 diabetes mellitus with diabetic peripheral 04/20/2016 Yes angiopathy without gangrene Inactive Problems Resolved Problems Electronic Signature(s) Signed: 04/21/2016 9:58:44 AM By: Linton Ham MD Entered By: Linton Ham on 04/20/2016 12:29:11 Alexis [name] ([id number]) -------------------------------------------------------------------------------- Progress Note Details Patient Name: Alexis [name] Date of Service: 04/20/2016 9:45 AM Medical Record Patient Account Number: [id number] Number: Treating RN: Ahmed Prima February 04, 1951 (65 y.o. Other Clinician: Date of Birth/Sex: Female) Treating Prinston Kynard Primary Care Provider: Mackie Pai Provider/Extender: G Referring Provider: Mackie Pai Weeks in Treatment: 0 Subjective Chief Complaint Information obtained from  Patient 04/20/16; the patient is here for problems with her toes on the right foot. History of Present Illness (HPI) 04/20/16; the patient tells me that a little over 3 weeks ago she applied faltering cream to her BILATERAL toes hoping to relieve pain of diabetic neuropathy in her feet. He days later she was picking some material off that she thought was dried ointment and it actually was denuding skin. Skin have literally peeled off her toes and the toes became very erythematous and painful. She was referred to her diet wrist he wasn't sure of the diagnosis. Noted difficulty feeling the pedal pulses in the right foot and referred her to vascular surgery for Doppler imaging however she has canceled this appointment and asked that they've week rebook her. Since there is toes became irritated and painful she has been using a topical anesthetic anesthetic [ACD cream] and triple antibiotic cream. She has not gotten any relief. She is here for our review of  this issue. The patient is a type II diabetic with an insulin pump. Staff could not get ABIs in our clinic as the Doppler signal was inaudible however she did have arterial Dopplers in February 2015. This showed an ABI on the right at 1.10 and a TBI on the right of greater than 0.7. The left ABI was 1.04 and the left TBI was greater than 0.7. Both of these are within normal limits. Wound History Patient presents with 1 open wound that has been present for approximately 3 weeks. Patient has been treating wound in the following manner: abt ointment and cbd oil. Laboratory tests have not been performed in the last month. Patient reportedly has not tested positive for an antibiotic resistant organism. Patient reportedly has not tested positive for osteomyelitis. Patient reportedly has not had testing performed to evaluate circulation in the legs. Patient experiences the following problems associated with their wounds: swelling. Patient History Information obtained from Patient. Allergies sulfur, pregabalin, indomethacin Alexis [name], Alexis [name] ([id number]) Family History Diabetes - Mother, Father, Siblings, Heart Disease - Father, Siblings, No family history of Cancer, Hereditary Spherocytosis, Hypertension, Kidney Disease, Lung Disease, Stroke, Thyroid Problems, Tuberculosis. Social History Former smoker - quit 12 yrs ago, Marital Status - Married, Alcohol Use - Never, Drug Use - No History, Caffeine Use - Daily. Medical History Eyes Patient has history of Cataracts Respiratory Patient has history of Asthma, Chronic Obstructive Pulmonary Disease (COPD) Cardiovascular Patient has history of Arrhythmia - a-fib, Congestive Heart Failure, Hypertension, Myocardial Infarction Endocrine Patient has history of Type II Diabetes Musculoskeletal Patient has history of Gout, Osteoarthritis Neurologic Patient has history of Neuropathy Patient is treated with Insulin. Blood sugar is tested. Review of  Systems (ROS) Constitutional Symptoms (General Health) The patient has no complaints or symptoms. Eyes Complains or has symptoms of Glasses / Contacts. Hematologic/Lymphatic The patient has no complaints or symptoms. Cardiovascular hyperlipidemia Gastrointestinal The patient has no complaints or symptoms. Endocrine Complains or has symptoms of Thyroid disease. Genitourinary The patient has no complaints or symptoms. Immunological The patient has no complaints or symptoms. Integumentary (Skin) The patient has no complaints or symptoms. Oncologic The patient has no complaints or symptoms. Psychiatric Complains or has symptoms of Anxiety. Alexis [name], Alexis [name] ([id number]) Objective Constitutional Sitting or standing Blood Pressure is within target range for patient.. Pulse regular and within target range for patient.Marland Kitchen Respirations regular, non-labored and within target range.. Temperature is normal and within the target range for the patient.. Patient's appearance is neat and clean. Appears in no acute distress.  Well nourished and well developed.. Vitals Time Taken: 9:59 AM, Height: 62 in, Source: Measured, Weight: 226 lbs, Source: Measured, BMI: 41.3, Temperature: 98.2 F, Pulse: 71 bpm, Respiratory Rate: 18 breaths/min, Blood Pressure: 139/76 mmHg. Respiratory Respiratory effort is easy and symmetric bilaterally. Rate is normal at rest and on room air.. Bilateral breath sounds are clear and equal in all lobes with no wheezes, rales or rhonchi.. Cardiovascular Heart rate is a regular no murmurs JVP is not elevated.. I had difficulty feeling her femoral pulses through her pants. The left dorsalis pedis pulse was faintly palpable. The right was not palpable. However I believe I could feel a right posterior tibial pulse and a right popliteal pulse. Her forefoot is cold right greater than left. There is also blanching.. Gastrointestinal (GI) Obese no masses  noted. Lymphatic Nonpalpable in the right popliteal or inguinal area. Neurological Markedly reduced vibration sense to her mid calves also light touch.. Psychiatric No evidence of depression, anxiety, or agitation. Calm, cooperative, and communicative. Appropriate interactions and affect.. General Notes: Wound exam; limited to the toes. The toes are erythematous and almost looked "scalded" the epithelium is denuded on the dorsal and ventral surfaces and between the toes. Also between the toes there is macerated-looking tissue somewhat reminiscent of tinea pedis. Although she states she put the Voltaren gel on both feet there is nothing similar on the left foot. This leads me to believe that this is not the etiology of this problem Integumentary (Hair, Skin) Wound #1 status is Open. Original cause of wound was Gradually Appeared. The wound is located on the Right,Distal,Circumferential Foot. The wound measures 4.5cm length x 20.1cm width x 0.1cm depth; 71.039cm^2 area and 7.104cm^3 volume. The wound is limited to skin breakdown. There is no tunneling or Alexis [name], Alexis [name]. ([id number]) undermining noted. There is a large amount of serous drainage noted. The wound margin is distinct with the outline attached to the wound base. There is large (67-100%) red, pink granulation within the wound bed. There is a small (1-33%) amount of necrotic tissue within the wound bed including Eschar and Adherent Slough. The periwound skin appearance exhibited: Erythema. The surrounding wound skin color is noted with erythema which is circumferential. Periwound temperature was noted as No Abnormality. The periwound has tenderness on palpation. 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 Cleanse wound with mild soap and water Anesthetic: Wound #1 Right,Distal,Circumferential Foot: Topical Lidocaine 4% cream applied to wound bed prior to debridement Primary Wound Dressing: Wound #1 Right,Distal,Circumferential Foot: Aquacel Ag Other: - nystatin cream Secondary Dressing: Wound #1 Right,Distal,Circumferential Foot: Gauze and Kerlix/Conform Other - netting Dressing Change Frequency: Wound #1 Right,Distal,Circumferential Foot: Change dressing every day. Follow-up Appointments: Wound #1 Right,Distal,Circumferential Foot: Return Appointment in 1 week. Edema Control: Wound #1 Right,Distal,Circumferential Foot: Alexis [name], Alexis [name]. ([id number]) Elevate legs to the level of the heart and pump ankles as often as possible Additional Orders / Instructions: Wound #1 Right,Distal,Circumferential Foot: Increase protein intake. Medications-please add to medication list.: Wound #1 Right,Distal,Circumferential Foot: Other: - vitamin c, zinc, MVI Consults ordered were: Vascular - Dr. Fletcher Anon Services and Therapies ordered were: Arterial Studies- Bilateral - Dr. Fletcher Anon #1 I have rearranged her vascular appointment with Dr. Fletcher Anon I think she  is going to need follow-up Doppler studies and ABIs. Clinically the situation looks like there is PAD quite significant especially on the right even though her arterial studies in 2015 were really quite normal/good #2 I think this is probably some form of topical/allergic dermatitis to something she is put on her toes. Notably that the Voltaren gel she states she put on both feet and I asked her this several times. I am therefore thinking that this is not the likely culprit. She is using a topical anesthetic as well as a BD cream I've asked her to stop this. #3 I have prescribed ketoconazole 2% daily to the toes between  the toes and use silver alginate, this silver is antibacterial and antifungal. #4 this does not look like cellulitis and does not look like a limb threatening emergency although I do want to go ahead with the arterial studies that were first arranged by her podiatrist. Side of referred her to Dr. Fletcher Anon Electronic Signature(s) Signed: 04/22/2016 3:37:52 PM By: Gretta Cool RN, BSN, Kim RN, BSN Signed: 04/28/2016 3:57:30 PM By: Linton Ham MD Previous Signature: 04/21/2016 9:58:44 AM Version By: Linton Ham MD Entered By: Gretta Cool, RN, BSN, Kim on 04/22/2016 15:37:51 Alexis [name] ([id number]) -------------------------------------------------------------------------------- ROS/PFSH Details Patient Name: Alexis [name], Alexis [name]. Date of Service: 04/20/2016 9:45 AM Medical Record Patient Account Number: [id number] Number: Treating RN: Ahmed Prima [birth year] (65 y.o. Other Clinician: Date of Birth/Sex: Female) Treating Davene Jobin, [place] Primary Care Provider: Mackie Pai Provider/Extender: G Referring Provider: Mackie Pai Weeks in Treatment: 0 Information Obtained From Patient Wound History Do you currently have one or more open woundso Yes How many open wounds do you currently haveo 1 Approximately how long have you had your woundso 3 weeks How have you been treating your wound(s) until nowo abt ointment and cbd oil Has your wound(s) ever healed and then re-openedo No Have you had any lab work done in the past montho No Have you tested positive for an antibiotic resistant organism (MRSA, No VRE)o Have you tested positive for osteomyelitis (bone infection)o No Have you had any tests for circulation on your legso No Have you had other problems associated with your woundso Swelling Eyes Complaints and Symptoms: Positive for: Glasses / Contacts Medical History: Positive for: Cataracts Endocrine Complaints and Symptoms: Positive for: Thyroid disease Medical  History: Positive for: Type II Diabetes Time with diabetes: 15 years Treated with: Insulin Blood sugar tested every day: Yes Tested : 6x a day Psychiatric Alexis [name], Alexis [name] ([id number]) Complaints and Symptoms: Positive for: Anxiety Constitutional Symptoms (General Health) Complaints and Symptoms: No Complaints or Symptoms Hematologic/Lymphatic Complaints and Symptoms: No Complaints or Symptoms Respiratory Medical History: Positive for: Asthma; Chronic Obstructive Pulmonary Disease (COPD) Cardiovascular Complaints and Symptoms: Review of System Notes: hyperlipidemia Medical History: Positive for: Arrhythmia - a-fib; Congestive Heart Failure; Hypertension; Myocardial Infarction Gastrointestinal Complaints and Symptoms: No Complaints or Symptoms Genitourinary Complaints and Symptoms: No Complaints or Symptoms Immunological Complaints and Symptoms: No Complaints or Symptoms Integumentary (Skin) Complaints and Symptoms: No Complaints or Symptoms Musculoskeletal Medical History: Positive for: Gout; Osteoarthritis Alexis [name], Alexis [name] ([id number]) Neurologic Medical History: Positive for: Neuropathy Oncologic Complaints and Symptoms: No Complaints or Symptoms HBO Extended History Items Eyes: Cataracts Immunizations Pneumococcal Vaccine: Received Pneumococcal Vaccination: Yes Family and Social History Cancer: No; Diabetes: Yes - Mother, Father, Siblings; Heart Disease: Yes - Father, Siblings; Hereditary Spherocytosis: No; Hypertension: No; Kidney Disease: No; Lung Disease: No; Stroke: No; Thyroid Problems: No; Tuberculosis: No; Former smoker - quit 12 yrs  ago; Marital Status - Married; Alcohol Use: Never; Drug Use: No History; Caffeine Use: Daily; Financial Concerns: No; Food, Clothing or Shelter Needs: No; Support System Lacking: No; Transportation Concerns: No; Advanced Directives: No; Patient does not want information on Advanced Directives; Do not resuscitate: No;  Living Will: No; Medical Power of Attorney: No Electronic Signature(s) Signed: 04/20/2016 4:45:40 PM By: Alric Quan Signed: 04/21/2016 9:58:44 AM By: Linton Ham MD Entered By: Alric Quan on 04/20/2016 10:09:20 Alexis [name] ([id number]) -------------------------------------------------------------------------------- [place] Details Patient Name: Alexis [name] Date of Service: 04/20/2016 Medical Record Patient Account Number: [id number] Number: Treating RN: Ahmed Prima 10-28-1950 (65 y.o. Other Clinician: Date of Birth/Sex: Female) Treating Kristen Bushway, [place] Primary Care Provider: Mackie Pai Provider/Extender: G Referring Provider: Mackie Pai Service Line: Outpatient Weeks in Treatment: 0 Diagnosis Coding ICD-10 Codes Code Description L97.518 Non-pressure chronic ulcer of other part of right foot with other specified severity B35.3 Tinea pedis E11.42 Type 2 diabetes mellitus with diabetic polyneuropathy E11.51 Type 2 diabetes mellitus with diabetic peripheral angiopathy without gangrene Facility Procedures CPT4 Code: [id number] Description: 99214 - WOUND CARE VISIT-LEV 4 EST PT Modifier: Quantity: 1 Physician Procedures CPT4: Description Modifier Quantity Code [id number] - WC PHYS LEVEL 4 - NEW PT 1 ICD-10 Description Diagnosis E11.51 Type 2 diabetes mellitus with diabetic peripheral angiopathy without gangrene L97.518 Non-pressure chronic ulcer of other part of  right foot with other specified severity B35.3 Tinea pedis Electronic Signature(s) Signed: 04/20/2016 4:45:40 PM By: Alric Quan Signed: 04/21/2016 9:58:44 AM By: Linton Ham MD Entered By: Alric Quan on 04/20/2016 14:39:17

## 2016-04-21 NOTE — Progress Notes (Signed)
Alexis, [name] ([id number]) Visit Report for 04/20/2016 Allergy List Details Patient Name: Alexis [name], Alexis [name]. Date of Service: 04/20/2016 9:45 AM Medical Record Patient Account Number: [id number] Number: Treating RN: Ahmed Prima Feb 19, 1951 (66 y.o. Other Clinician: Date of Birth/Sex: Female) Treating ROBSON, MICHAEL Primary Care Brunella Wileman: Mackie Pai Letta Cargile/Extender: G Referring Kerryn Tennant: Mackie Pai Weeks in Treatment: 0 Allergies Active Allergies sulfur pregabalin indomethacin Allergy Notes Electronic Signature(s) Signed: 04/20/2016 4:45:40 PM By: Alric Quan Entered By: Alric Quan on 04/20/2016 10:03:27 Alexis [name] ([id number]) -------------------------------------------------------------------------------- [place] Details Patient Name: Alexis [name] Date of Service: 04/20/2016 9:45 AM Medical Record Patient Account Number: [id number] Number: Treating RN: Ahmed Prima 1950-07-16 (66 y.o. Other Clinician: Date of Birth/Sex: Female) Treating ROBSON, MICHAEL Primary Care Jahn Franchini: Mackie Pai Cathlyn Tersigni/Extender: G Referring Cy Bresee: Mal Misty in Treatment: 0 Visit Information Patient Arrived: Ambulatory Arrival Time: 09:56 Accompanied By: self Transfer Assistance: None Patient Identification Verified: Yes Secondary Verification Process Yes Completed: Patient Requires Transmission- No Based Precautions: Patient Has Alerts: Yes Patient Alerts: Patient on Blood Thinner DM II Eliquis Electronic Signature(s) Signed: 04/20/2016 4:45:40 PM By: Alric Quan Entered By: Alric Quan on 04/20/2016 09:57:04 Alexis [name] ([id number]) -------------------------------------------------------------------------------- Clinic Level of Care Assessment Details Patient Name: Alexis [name] Date of Service: 04/20/2016 9:45 AM Medical Record Patient Account Number:  [id number] Number: Treating RN: Ahmed Prima 1950/07/13 (66 y.o. Other Clinician: Date of Birth/Sex: Female) Treating ROBSON, MICHAEL Primary Care Danzell Birky: Mackie Pai [place]qua Schaper/Extender: G Referring Keydi Giel: Mal Misty in Treatment: 0 Clinic Level of Care Assessment Items TOOL 2 Quantity Score X - Use when only an EandM is performed on the INITIAL visit 1 0 ASSESSMENTS - Nursing Assessment / Reassessment X - General Physical Exam (combine w/ comprehensive assessment (listed just 1 20 below) when performed on new pt. evals) X - Comprehensive Assessment (HX, ROS, Risk Assessments, Wounds Hx, etc.) 1 25 ASSESSMENTS - Wound and Skin Assessment / Reassessment X - Simple Wound Assessment / Reassessment - one wound 1 5 []  - Complex Wound Assessment / Reassessment - multiple wounds 0 []  - Dermatologic / Skin Assessment (not related to wound area) 0 ASSESSMENTS - Ostomy and/or Continence Assessment and Care []  - Incontinence Assessment and Management 0 []  - Ostomy Care Assessment and Management (repouching, etc.) 0 PROCESS - Coordination of Care []  - Simple Patient / Family Education for ongoing care 0 X - Complex (extensive) Patient / Family Education for ongoing care 1 20 X - Staff obtains Programmer, systems, Records, Test Results / Process Orders 1 10 []  - Staff telephones HHA, Nursing Homes / Clarify orders / etc 0 []  - Routine Transfer to another Facility (non-emergent condition) 0 []  - Routine Hospital Admission (non-emergent condition) 0 X - New Admissions / Biomedical engineer / Ordering NPWT, Apligraf, etc. 1 15 []  - Emergency Hospital Admission (emergent condition) 0 Kyllonen, Tamey S. ([id number]) X - Simple Discharge Coordination 1 10 []  - Complex (extensive) Discharge Coordination 0 PROCESS - Special Needs []  - Pediatric / Minor Patient Management 0 []  - Isolation Patient Management 0 []  - Hearing / Language / Visual special needs 0 []  - Assessment  of Community assistance (transportation, D/C planning, etc.) 0 []  - Additional assistance / Altered mentation 0 []  - Support Surface(s) Assessment (bed, cushion, seat, etc.) 0 INTERVENTIONS - Wound Cleansing / Measurement X - Wound Imaging (photographs - any number of wounds) 1 5 []  - Wound Tracing (instead of photographs) 0 []  - Simple Wound Measurement - one wound  0 X - Complex Wound Measurement - multiple wounds 1 5 []  - Simple Wound Cleansing - one wound 0 X - Complex Wound Cleansing - multiple wounds 1 5 INTERVENTIONS - Wound Dressings []  - Small Wound Dressing one or multiple wounds 0 X - Medium Wound Dressing one or multiple wounds 1 15 []  - Large Wound Dressing one or multiple wounds 0 []  - Application of Medications - injection 0 INTERVENTIONS - Miscellaneous []  - External ear exam 0 []  - Specimen Collection (cultures, biopsies, blood, body fluids, etc.) 0 []  - Specimen(s) / Culture(s) sent or taken to Lab for analysis 0 []  - Patient Transfer (multiple staff / Harrel Lemon Lift / Similar devices) 0 []  - Simple Staple / Suture removal (25 or less) 0 Pandya, Kem S. ([id number]) []  - Complex Staple / Suture removal (26 or more) 0 []  - Hypo / Hyperglycemic Management (close monitor of Blood Glucose) 0 X - Ankle / Brachial Index (ABI) - do not check if billed separately 1 15 Has the patient been seen at the hospital within the last three years: Yes Total Score: 150 Level Of Care: New/Established - Level 4 Electronic Signature(s) Signed: 04/20/2016 4:45:40 PM By: Alric Quan Entered By: Alric Quan on 04/20/2016 14:38:59 Alexis [name] ([id number]) -------------------------------------------------------------------------------- Encounter Discharge Information Details Patient Name: Alexis [name]. Date of Service: 04/20/2016 9:45 AM Medical Record Patient Account Number: [id number] Number: Treating RN: Ahmed Prima Nov 04, 1950 (66 y.o. Other  Clinician: Date of Birth/Sex: Female) Treating ROBSON, MICHAEL Primary Care Matthan Sledge: Mackie Pai Brysten Reister/Extender: G Referring Saraia Platner: Mal Misty in Treatment: 0 Encounter Discharge Information Items Discharge Pain Level: 8 Discharge Condition: Stable Ambulatory Status: Ambulatory Discharge Destination: Home Transportation: Private Auto Accompanied By: self Schedule Follow-up Appointment: Yes Medication Reconciliation completed and provided to Patient/Care No Aarya Quebedeaux: Provided on Clinical Summary of Care: 04/20/2016 Form Type Recipient Paper Patient SO Electronic Signature(s) Signed: 04/20/2016 11:08:10 AM By: Ruthine Dose Entered By: Ruthine Dose on 04/20/2016 11:08:10 Alexis [name] ([id number]) -------------------------------------------------------------------------------- Lower Extremity Assessment Details Patient Name: Alexis [name]. Date of Service: 04/20/2016 9:45 AM Medical Record Patient Account Number: [id number] Number: Treating RN: Ahmed Prima 1950-06-30 (65 y.o. Other Clinician: Date of Birth/Sex: Female) Treating ROBSON, MICHAEL Primary Care Venisa Frampton: Mackie Pai Porfiria Heinrich/Extender: G Referring Miciah Covelli: Mackie Pai Weeks in Treatment: 0 Vascular Assessment Pulses: Dorsalis Pedis Palpable: [Right:No] Doppler Audible: [Right:Inaudible] Posterior Tibial Extremity colors, hair growth, and conditions: Extremity Color: [Right:Normal] Temperature of Extremity: [Right:Cool] Capillary Refill: [Right:< 3 seconds] Toe Nail Assessment Left: Right: Thick: No Discolored: No Deformed: No Improper Length and Hygiene: No Notes unable to get abi d/t pulse being inaudible Electronic Signature(s) Signed: 04/20/2016 4:45:40 PM By: Alric Quan Entered By: Alric Quan on 04/20/2016 10:23:07 Alexis [name]  ([id number]) -------------------------------------------------------------------------------- Multi Wound Chart Details Patient Name: Alexis [name]. Date of Service: 04/20/2016 9:45 AM Medical Record Patient Account Number: [id number] Number: Treating RN: Ahmed Prima June 19, 1950 (65 y.o. Other Clinician: Date of Birth/Sex: Female) Treating ROBSON, MICHAEL Primary Care Valiant Dills: Mackie Pai Natia Fahmy/Extender: G Referring Kyiah Canepa: Mackie Pai Weeks in Treatment: 0 Vital Signs Height(in): 62 Pulse(bpm): 71 Weight(lbs): 226 Blood Pressure 139/76 (mmHg): Body Mass Index(BMI): 41 Temperature(F): 98.2 Respiratory Rate 18 (breaths/min): Photos: [1:No Photos] [N/A:N/A] Wound Location: [1:Right Foot - Distal, Circumfernential] [N/A:N/A] Wounding Event: [1:Gradually Appeared] [N/A:N/A] Primary Etiology: [1:Diabetic Wound/Ulcer of N/A the Lower Extremity] Comorbid History: [1:Cataracts, Asthma, Chronic Obstructive Pulmonary Disease (COPD), Arrhythmia, Congestive Heart Failure, Hypertension, Myocardial Infarction, Type II Diabetes, Gout, Osteoarthritis, Neuropathy] [N/A:N/A] Date  Acquired: [1:03/30/2016] [N/A:N/A] Weeks of Treatment: [1:0] [N/A:N/A] Wound Status: [1:Open] [N/A:N/A] Measurements L x W x D 4.5x20.1x0.1 [N/A:N/A] (cm) Area (cm) : [1:71.039] [N/A:N/A] Volume (cm) : [1:7.104] [N/A:N/A] Classification: [1:Grade 2] [N/A:N/A] Exudate Amount: [1:Large] [N/A:N/A] Exudate Type: [1:Serous] [N/A:N/A] Exudate Color: [1:amber] [N/A:N/A] Wound Margin: [1:Distinct, outline attached N/A] Granulation Amount: [1:Large (67-100%)] [N/A:N/A] Granulation Quality: Red, Pink N/A N/A Necrotic Amount: Small (1-33%) N/A N/A Necrotic Tissue: Eschar, Adherent Slough N/A N/A Exposed Structures: Fascia: No N/A N/A Fat Layer (Subcutaneous Tissue) Exposed: No Tendon: No Muscle: No Joint: No Bone: No Limited to Skin Breakdown Epithelialization: None N/A  N/A Periwound Skin Texture: No Abnormalities Noted N/A N/A Periwound Skin No Abnormalities Noted N/A N/A Moisture: Periwound Skin Color: Erythema: Yes N/A N/A Erythema Location: Circumferential N/A N/A Temperature: No Abnormality N/A N/A Tenderness on Yes N/A N/A Palpation: Wound Preparation: Ulcer Cleansing: N/A N/A Rinsed/Irrigated with Saline, Other: soap and water Topical Anesthetic Applied: Other: lidocaine 4% Treatment Notes Wound #1 (Right, Distal, Circumferential Foot) 1. Cleansed with: Clean wound with Normal Saline Cleanse wound with antibacterial soap and water 2. Anesthetic Topical Lidocaine 4% cream to wound bed prior to debridement 3. Peri-wound Care: Antifungal cream 4. Dressing Applied: Aquacel Ag 5. Secondary Dressing Applied Gauze and Kerlix/Conform 7. Secured with Tape Notes netting DANELIA, SNODGRASS ([id number]) Electronic Signature(s) Signed: 04/21/2016 9:58:44 AM By: Linton Ham MD Entered By: Linton Ham on 04/20/2016 12:29:33 Alexis [name] ([id number]) -------------------------------------------------------------------------------- [place] Details Patient Name: DYNASTY, HOLQUIN. Date of Service: 04/20/2016 9:45 AM Medical Record Patient Account Number: [id number] Number: Treating RN: Ahmed Prima 1950/08/07 (65 y.o. Other Clinician: Date of Birth/Sex: Female) Treating ROBSON, MICHAEL Primary Care Ricka Westra: Mackie Pai Ismerai Bin/Extender: G Referring Davin Muramoto: Mal Misty in Treatment: 0 Active Inactive ` Abuse / Safety / Falls / Self Care Management Nursing Diagnoses: Potential for falls Goals: Patient will remain injury free Date Initiated: 04/20/2016 Target Resolution Date: 07/03/2016 Goal Status: Active Interventions: Assess fall risk on admission and as needed Assess self care needs on admission and as needed Notes: ` Nutrition Nursing Diagnoses: Imbalanced  nutrition Goals: Patient/caregiver agrees to and verbalizes understanding of need to use nutritional supplements and/or vitamins as prescribed Date Initiated: 04/20/2016 Target Resolution Date: 07/03/2016 Goal Status: Active Interventions: Assess patient nutrition upon admission and as needed per policy Notes: ` Orientation to the [place], [place]. ([id number]) Nursing Diagnoses: Knowledge deficit related to the wound healing center program Goals: Patient/caregiver will verbalize understanding of the [place] Program Date Initiated: 04/20/2016 Target Resolution Date: 05/08/2016 Goal Status: Active Interventions: Provide education on orientation to the wound center Notes: ` Pain, Acute or Chronic Nursing Diagnoses: Pain, acute or chronic: actual or potential Potential alteration in comfort, pain Goals: Patient will verbalize adequate pain control and receive pain control interventions during procedures as needed Date Initiated: 04/20/2016 Target Resolution Date: 07/03/2016 Goal Status: Active Interventions: Assess comfort goal upon admission Complete pain assessment as per visit requirements Notes: ` Wound/Skin Impairment Nursing Diagnoses: Impaired tissue integrity Knowledge deficit related to smoking impact on wound healing Knowledge deficit related to ulceration/compromised skin integrity Goals: Ulcer/skin breakdown will have a volume reduction of 80% by week 12 Date Initiated: 04/20/2016 Target Resolution Date: 06/26/2016 Goal Status: Active Interventions: SEVILLA, MURTAGH ([id number]) Assess patient/caregiver ability to perform ulcer/skin care regimen upon admission and as needed Assess ulceration(s) every visit Notes: Electronic Signature(s) Signed: 04/20/2016 4:45:40 PM By: Alric Quan Entered By: Alric Quan on 04/20/2016 10:32:46 Harl Bowie  Chauncey Cruel  ([id number]) -------------------------------------------------------------------------------- Pain Assessment Details Patient Name: ANALIZ, TVEDT. Date of Service: 04/20/2016 9:45 AM Medical Record Patient Account Number: [id number] Number: Treating RN: Ahmed Prima 1950/12/12 (65 y.o. Other Clinician: Date of Birth/Sex: Female) Treating ROBSON, MICHAEL Primary Care Mehak Roskelley: Mackie Pai Zavia Pullen/Extender: G Referring Duvid Smalls: Mackie Pai Weeks in Treatment: 0 Active Problems Location of Pain Severity and Description of Pain Patient Has Paino Yes Site Locations Pain Location: Pain in Ulcers With Dressing Change: Yes Duration of the Pain. Constant / Intermittento Constant Rate the pain. Current Pain Level: 8 Worst Pain Level: 10 Least Pain Level: 4 Character of Pain Describe the Pain: Burning, Heavy, Tender Pain Management and Medication Current Pain Management: Electronic Signature(s) Signed: 04/20/2016 4:45:40 PM By: Alric Quan Entered By: Alric Quan on 04/20/2016 09:59:06 Alexis [name] ([id number]) -------------------------------------------------------------------------------- Patient/Caregiver Education Details Patient Name: Alexis [name]. Date of Service: 04/20/2016 9:45 AM Medical Record Patient Account Number: [id number] Number: Treating RN: Ahmed Prima February 18, 1951 (65 y.o. Other Clinician: Date of Birth/Gender: Female) Treating ROBSON, MICHAEL Primary Care Physician/Extender: Sabino Gasser, EDWARD Physician: Suella [place] in Treatment: 0 Referring Physician: Mackie Pai Education Assessment Education Provided To: Patient Education Topics Provided Welcome To [facility]: Handouts: Welcome To [facility] Methods: Explain/Verbal Responses: State content correctly Wound/Skin Impairment: Handouts: Other: change dressing as ordered Methods: Demonstration, Explain/Verbal Responses: State  content correctly Electronic Signature(s) Signed: 04/20/2016 4:45:40 PM By: Alric Quan Entered By: Alric Quan on 04/20/2016 10:35:09 Alexis [name] ([id number]) -------------------------------------------------------------------------------- Wound Assessment Details Patient Name: Alexis [name]. Date of Service: 04/20/2016 9:45 AM Medical Record Patient Account Number: [id number] Number: Treating RN: Ahmed Prima 02-17-1951 (65 y.o. Other Clinician: Date of Birth/Sex: Female) Treating ROBSON, MICHAEL Primary Care Rhiana Morash: Mackie Pai Zaina Jenkin/Extender: G Referring Ellin Fitzgibbons: Mackie Pai Weeks in Treatment: 0 Wound Status Wound Number: 1 Primary Diabetic Wound/Ulcer of the Lower Etiology: Extremity Wound Location: Right Foot - Distal, Circumfernential Wound Open Status: Wounding Event: Gradually Appeared Comorbid Cataracts, Asthma, Chronic Obstructive Date Acquired: 03/30/2016 History: Pulmonary Disease (COPD), Weeks Of Treatment: 0 Arrhythmia, Congestive Heart Failure, Clustered Wound: No Hypertension, Myocardial Infarction, Type II Diabetes, Gout, Osteoarthritis, Neuropathy Wound Measurements Length: (cm) 4.5 Width: (cm) 20.1 Depth: (cm) 0.1 Area: (cm) 71.039 Volume: (cm) 7.104 % Reduction in Area: % Reduction in Volume: Epithelialization: None Tunneling: No Undermining: No Wound Description Classification: Grade 2 Foul Odor Aft Wound Margin: Distinct, outline attached Slough/Fibrin Exudate Amount: Large Exudate Type: Serous Exudate Color: amber er Cleansing: No o No Wound Bed Granulation Amount: Large (67-100%) Exposed Structure Granulation Quality: Red, Pink Fascia Exposed: No Necrotic Amount: Small (1-33%) Fat Layer (Subcutaneous Tissue) Exposed: No Necrotic Quality: Eschar, Adherent Slough Tendon Exposed: No Muscle Exposed: No Joint Exposed: No Bone Exposed: No Limited to Skin Breakdown Periwound Skin  Texture Texture Color ALIAHNA, STATZER. ([id number]) No Abnormalities Noted: No No Abnormalities Noted: No Erythema: Yes Moisture Erythema Location: Circumferential No Abnormalities Noted: No Temperature / Pain Temperature: No Abnormality Tenderness on Palpation: Yes Wound Preparation Ulcer Cleansing: Rinsed/Irrigated with Saline, Other: soap and water, Topical Anesthetic Applied: Other: lidocaine 4%, Treatment Notes Wound #1 (Right, Distal, Circumferential Foot) 1. Cleansed with: Clean wound with Normal Saline Cleanse wound with antibacterial soap and water 2. Anesthetic Topical Lidocaine 4% cream to wound bed prior to debridement 3. Peri-wound Care: Antifungal cream 4. Dressing Applied: Aquacel Ag 5. Secondary Dressing Applied Gauze and Kerlix/Conform 7. Secured with Tape Notes netting Electronic Signature(s) Signed: 04/20/2016 4:45:40 PM By: Alric Quan Entered  By: Alric Quan on 04/20/2016 10:28:05 Alexis [name] ([id number]) -------------------------------------------------------------------------------- [place] Details Patient Name: Alexis [name] Date of Service: 04/20/2016 9:45 AM Medical Record Patient Account Number: [id number] Number: Treating RN: Ahmed Prima 1950/05/02 (65 y.o. Other Clinician: Date of Birth/Sex: Female) Treating ROBSON, MICHAEL Primary Care Alycen Mack: Mackie Pai Turon Kilmer/Extender: G Referring Jackilyn Umphlett: Mackie Pai Weeks in Treatment: 0 Vital Signs Time Taken: 09:59 Temperature (F): 98.2 Height (in): 62 Pulse (bpm): 71 Source: Measured Respiratory Rate (breaths/min): 18 Weight (lbs): 226 Blood Pressure (mmHg): 139/76 Source: Measured Reference Range: 80 - 120 mg / dl Body Mass Index (BMI): 41.3 Electronic Signature(s) Signed: 04/20/2016 4:45:40 PM By: Alric Quan Entered By: Alric Quan on 04/20/2016 10:01:54

## 2016-04-21 NOTE — Progress Notes (Signed)
Alexis [name] ([id number]) Visit Report for 04/20/2016 Abuse/Suicide Risk Screen Details Patient Name: Alexis [name], Alexis [name]. Date of Service: 04/20/2016 9:45 AM Medical Record Patient Account Number: [id number] Number: Treating RN: Alexis [name] [birth year] (65 y.o. Other Clinician: Date of Birth/Sex: Female) Treating Alexis [name] Primary Care Alexis [name]: Alexis [name] Alexis [name]/Extender: G Referring Alexis [name]: Alexis [name] Weeks in Treatment: 0 Abuse/Suicide Risk Screen Items Answer ABUSE/SUICIDE RISK SCREEN: Has anyone close to you tried to hurt or harm you recentlyo No Do you feel uncomfortable with anyone in your familyo No Has anyone forced you do things that you didnot want to doo No Do you have any thoughts of harming yourselfo No Patient displays signs or symptoms of abuse and/or neglect. No Electronic Signature(s) Signed: 04/20/2016 4:45:40 PM By: Alric Quan Entered By: Alric Quan on 04/20/2016 10:09:27 Alexis [name] ([id number]) -------------------------------------------------------------------------------- Activities of Daily Living Details Patient Name: Alexis [name]. Date of Service: 04/20/2016 9:45 AM Medical Record Patient Account Number: [id number] Number: Treating RN: Alexis [name] October 31, 1950 (65 y.o. Other Clinician: Date of Birth/Sex: Female) Treating Alexis [name] Primary Care Alexis [name]: Alexis [name] Alexis [name]/Extender: G Referring Alexis [name]: Alexis [name] Weeks in Treatment: 0 Activities of Daily Living Items Answer Activities of Daily Living (Please select one for each item) Drive Automobile Completely Able Take Medications Completely Able Use Telephone Completely Able Care for Appearance Completely Able Use Toilet Completely Able Bath / Shower Completely Able Dress Self Completely Able Feed Self Completely Able Walk Completely Able Get In / Out Bed Completely Able Housework Completely  Able Prepare Meals Completely Able Handle Money Completely Able Shop for Self Completely Able Electronic Signature(s) Signed: 04/20/2016 4:45:40 PM By: Alric Quan Entered By: Alric Quan on 04/20/2016 10:09:44 Alexis [name] ([id number]) -------------------------------------------------------------------------------- Education Assessment Details Patient Name: Alexis [name] Date of Service: 04/20/2016 9:45 AM Medical Record Patient Account Number: [id number] Number: Treating RN: Alexis [name] 06-16-1950 (65 y.o. Other Clinician: Date of Birth/Sex: Female) Treating Alexis [name] Primary Care Alexis [name]: Alexis [name] Alexis [name]/Extender: G Referring Alexis [name]: Alexis [name] in Treatment: 0 Primary Learner Assessed: Patient Learning Preferences/Education Level/Primary Language Learning Preference: Explanation, Printed Material Highest Education Level: College or Above Preferred Language: English Cognitive Barrier Assessment/Beliefs Language Barrier: No Translator Needed: No Memory Deficit: No Emotional Barrier: No Cultural/Religious Beliefs Affecting Medical No Care: Physical Barrier Assessment Impaired Vision: Yes Glasses Impaired Hearing: No Decreased Hand dexterity: No Knowledge/Comprehension Assessment Knowledge Level: Medium Comprehension Level: Medium Ability to understand written Medium instructions: Ability to understand verbal Medium instructions: Motivation Assessment Anxiety Level: Calm Cooperation: Cooperative Education Importance: Acknowledges Need Interest in Health Problems: Asks Questions Perception: Coherent Willingness to Engage in Self- Medium Management Activities: Readiness to Engage in Self- Medium Management Activities: [name], [name] ([id number]) Electronic Signature(s) Signed: 04/20/2016 4:45:40 PM By: Alric Quan Entered By: Alric Quan on 04/20/2016 10:10:03 Alexis [name]  ([id number]) -------------------------------------------------------------------------------- Fall Risk Assessment Details Patient Name: Alexis [name] Date of Service: 04/20/2016 9:45 AM Medical Record Patient Account Number: [id number] Number: Treating RN: Alexis [name] 26-Nov-1950 (65 y.o. Other Clinician: Date of Birth/Sex: Female) Treating Alexis [name] Primary Care Alexis [name]: Alexis [name] Alexis [name]/Extender: G Referring Alexis [name]: Alexis [name] Weeks in Treatment: 0 Fall Risk Assessment Items Have you had 2 or more falls in the last 12 monthso 0 Yes Have you had any fall that resulted in injury in the last 12 monthso 0 No FALL RISK ASSESSMENT: History of falling - immediate or within 3 months 0 No Secondary diagnosis 15 Yes Ambulatory aid None/bed rest/wheelchair/nurse  0 No Crutches/cane/walker 0 No Furniture 0 No IV Access/Saline Lock 0 No Gait/Training Normal/bed rest/immobile 0 No Weak 0 No Impaired 0 No Mental Status Oriented to own ability 0 Yes Electronic Signature(s) Signed: 04/20/2016 4:45:40 PM By: Alric Quan Entered By: Alric Quan on 04/20/2016 10:11:12 Alexis [name] ([id number]) -------------------------------------------------------------------------------- Foot Assessment Details Patient Name: Alexis [name]. Date of Service: 04/20/2016 9:45 AM Medical Record Patient Account Number: [id number] Number: Treating RN: Alexis [name] 01-10-1951 (65 y.o. Other Clinician: Date of Birth/Sex: Female) Treating Alexis [name] Primary Care Alexis [name]: Alexis [name] Alexis [name]/Extender: G Referring Alexis [name]: Alexis [name] Weeks in Treatment: 0 Foot Assessment Items Site Locations + = Sensation present, - = Sensation absent, C = Callus, U = Ulcer R = Redness, W = Warmth, M = Maceration, PU = Pre-ulcerative lesion F = Fissure, S = Swelling, D = Dryness Assessment Right: Left: Other Deformity: No No Prior Foot  Ulcer: No No Prior Amputation: No No Charcot Joint: No No Ambulatory Status: Ambulatory Without Help Gait: Steady Electronic Signature(s) Signed: 04/20/2016 4:45:40 PM By: Alric Quan Entered By: Alric Quan on 04/20/2016 10:14:53 Alexis [name] ([id number]) -------------------------------------------------------------------------------- Nutrition Risk Assessment Details Patient Name: Alexis [name]. Date of Service: 04/20/2016 9:45 AM Medical Record Patient Account Number: [id number] Number: Treating RN: Alexis [name] May 09, 1950 (65 y.o. Other Clinician: Date of Birth/Sex: Female) Treating Alexis [name] Primary Care Gwendola Hornaday: Alexis [name] Brazen Domangue/Extender: G Referring Ram Haugan: Alexis [name] Weeks in Treatment: 0 Height (in): 62 Weight (lbs): 226 Body Mass Index (BMI): 41.3 Nutrition Risk Assessment Items NUTRITION RISK SCREEN: I have an illness or condition that made me change the kind and/or 2 Yes amount of food I eat I eat fewer than two meals per day 0 No I eat few fruits and vegetables, or milk products 0 No I have three or more drinks of beer, liquor or wine almost every day 0 No I have tooth or mouth problems that make it hard for me to eat 0 No I don't always have enough money to buy the food I need 0 No I eat alone most of the time 0 No I take three or more different prescribed or over-the-counter drugs a 1 Yes day Without wanting to, I have lost or gained 10 pounds in the last six 0 No months I am not always physically able to shop, cook and/or feed myself 0 No Nutrition Protocols Good Risk Protocol Moderate Risk Protocol Electronic Signature(s) Signed: 04/20/2016 4:45:40 PM By: Alric Quan Entered By: Alric Quan on 04/20/2016 10:11:37

## 2016-04-26 ENCOUNTER — Encounter: Payer: Self-pay | Admitting: Medical

## 2016-04-26 ENCOUNTER — Telehealth: Payer: Self-pay | Admitting: Medical

## 2016-04-26 NOTE — Telephone Encounter (Signed)
Caller name: Relationship to patient: Self Can be reached: [phone]  Pharmacy:  [facility], [place] - [address] [phone] (Phone) [phone] (Fax)     Reason for call: Patient request a Rx for Lyrica. States she has discussed with provider and now she feels that is the only medicine that will ease her nerve pain. Plse adv

## 2016-04-27 ENCOUNTER — Emergency Department (HOSPITAL_COMMUNITY): Payer: Medicare HMO

## 2016-04-27 ENCOUNTER — Inpatient Hospital Stay (HOSPITAL_COMMUNITY)
Admission: EM | Admit: 2016-04-27 | Discharge: 2016-05-02 | DRG: 271 | Disposition: A | Discharge status: Home Health | Payer: Medicare HMO | Attending: Family Medicine | Admitting: Family Medicine

## 2016-04-27 ENCOUNTER — Encounter (HOSPITAL_COMMUNITY): Payer: Self-pay | Admitting: Emergency Medicine

## 2016-04-27 ENCOUNTER — Encounter: Payer: Medicare HMO | Admitting: Internal Medicine

## 2016-04-27 DIAGNOSIS — I739 Peripheral vascular disease, unspecified: Secondary | ICD-10-CM

## 2016-04-27 DIAGNOSIS — Z96652 Presence of left artificial knee joint: Secondary | ICD-10-CM | POA: Diagnosis present

## 2016-04-27 DIAGNOSIS — J449 Chronic obstructive pulmonary disease, unspecified: Secondary | ICD-10-CM | POA: Diagnosis present

## 2016-04-27 DIAGNOSIS — Z8249 Family history of ischemic heart disease and other diseases of the circulatory system: Secondary | ICD-10-CM

## 2016-04-27 DIAGNOSIS — I481 Persistent atrial fibrillation: Secondary | ICD-10-CM | POA: Diagnosis present

## 2016-04-27 DIAGNOSIS — E785 Hyperlipidemia, unspecified: Secondary | ICD-10-CM | POA: Diagnosis present

## 2016-04-27 DIAGNOSIS — I110 Hypertensive heart disease with heart failure: Secondary | ICD-10-CM | POA: Diagnosis present

## 2016-04-27 DIAGNOSIS — E1152 Type 2 diabetes mellitus with diabetic peripheral angiopathy with gangrene: Secondary | ICD-10-CM | POA: Diagnosis present

## 2016-04-27 DIAGNOSIS — Z66 Do not resuscitate: Secondary | ICD-10-CM | POA: Diagnosis present

## 2016-04-27 DIAGNOSIS — Z87891 Personal history of nicotine dependence: Secondary | ICD-10-CM

## 2016-04-27 DIAGNOSIS — Z9641 Presence of insulin pump (external) (internal): Secondary | ICD-10-CM | POA: Diagnosis present

## 2016-04-27 DIAGNOSIS — E039 Hypothyroidism, unspecified: Secondary | ICD-10-CM | POA: Diagnosis present

## 2016-04-27 DIAGNOSIS — I998 Other disorder of circulatory system: Secondary | ICD-10-CM | POA: Diagnosis not present

## 2016-04-27 DIAGNOSIS — S91301A Unspecified open wound, right foot, initial encounter: Secondary | ICD-10-CM | POA: Diagnosis not present

## 2016-04-27 DIAGNOSIS — I509 Heart failure, unspecified: Secondary | ICD-10-CM | POA: Diagnosis not present

## 2016-04-27 DIAGNOSIS — Z882 Allergy status to sulfonamides status: Secondary | ICD-10-CM

## 2016-04-27 DIAGNOSIS — L089 Local infection of the skin and subcutaneous tissue, unspecified: Secondary | ICD-10-CM

## 2016-04-27 DIAGNOSIS — I4819 Other persistent atrial fibrillation: Secondary | ICD-10-CM

## 2016-04-27 DIAGNOSIS — Z7901 Long term (current) use of anticoagulants: Secondary | ICD-10-CM

## 2016-04-27 DIAGNOSIS — Z6841 Body Mass Index (BMI) 40.0 and over, adult: Secondary | ICD-10-CM

## 2016-04-27 DIAGNOSIS — I10 Essential (primary) hypertension: Secondary | ICD-10-CM | POA: Diagnosis not present

## 2016-04-27 DIAGNOSIS — E038 Other specified hypothyroidism: Secondary | ICD-10-CM

## 2016-04-27 DIAGNOSIS — Z833 Family history of diabetes mellitus: Secondary | ICD-10-CM | POA: Diagnosis not present

## 2016-04-27 DIAGNOSIS — Z888 Allergy status to other drugs, medicaments and biological substances status: Secondary | ICD-10-CM

## 2016-04-27 DIAGNOSIS — E1159 Type 2 diabetes mellitus with other circulatory complications: Secondary | ICD-10-CM | POA: Diagnosis not present

## 2016-04-27 DIAGNOSIS — E1142 Type 2 diabetes mellitus with diabetic polyneuropathy: Secondary | ICD-10-CM | POA: Diagnosis present

## 2016-04-27 DIAGNOSIS — E1151 Type 2 diabetes mellitus with diabetic peripheral angiopathy without gangrene: Secondary | ICD-10-CM | POA: Diagnosis not present

## 2016-04-27 DIAGNOSIS — E11621 Type 2 diabetes mellitus with foot ulcer: Secondary | ICD-10-CM | POA: Diagnosis not present

## 2016-04-27 DIAGNOSIS — N179 Acute kidney failure, unspecified: Secondary | ICD-10-CM | POA: Diagnosis not present

## 2016-04-27 DIAGNOSIS — I70261 Atherosclerosis of native arteries of extremities with gangrene, right leg: Secondary | ICD-10-CM | POA: Diagnosis not present

## 2016-04-27 DIAGNOSIS — D696 Thrombocytopenia, unspecified: Secondary | ICD-10-CM | POA: Diagnosis present

## 2016-04-27 DIAGNOSIS — I743 Embolism and thrombosis of arteries of the lower extremities: Secondary | ICD-10-CM | POA: Diagnosis not present

## 2016-04-27 DIAGNOSIS — Z794 Long term (current) use of insulin: Secondary | ICD-10-CM

## 2016-04-27 DIAGNOSIS — I70263 Atherosclerosis of native arteries of extremities with gangrene, bilateral legs: Principal | ICD-10-CM | POA: Diagnosis present

## 2016-04-27 DIAGNOSIS — I70201 Unspecified atherosclerosis of native arteries of extremities, right leg: Secondary | ICD-10-CM | POA: Diagnosis not present

## 2016-04-27 DIAGNOSIS — I252 Old myocardial infarction: Secondary | ICD-10-CM

## 2016-04-27 DIAGNOSIS — Z791 Long term (current) use of non-steroidal anti-inflammatories (NSAID): Secondary | ICD-10-CM

## 2016-04-27 DIAGNOSIS — I96 Gangrene, not elsewhere classified: Secondary | ICD-10-CM | POA: Diagnosis present

## 2016-04-27 DIAGNOSIS — L97518 Non-pressure chronic ulcer of other part of right foot with other specified severity: Secondary | ICD-10-CM | POA: Diagnosis not present

## 2016-04-27 DIAGNOSIS — B353 Tinea pedis: Secondary | ICD-10-CM | POA: Diagnosis not present

## 2016-04-27 DIAGNOSIS — E119 Type 2 diabetes mellitus without complications: Secondary | ICD-10-CM

## 2016-04-27 DIAGNOSIS — L97519 Non-pressure chronic ulcer of other part of right foot with unspecified severity: Secondary | ICD-10-CM | POA: Diagnosis present

## 2016-04-27 DIAGNOSIS — R600 Localized edema: Secondary | ICD-10-CM | POA: Diagnosis not present

## 2016-04-27 LAB — CBC WITH DIFFERENTIAL/PLATELET
BASOS ABS: 0 10*3/uL (ref 0.0–0.1)
Basophils Relative: 0 %
Eosinophils Absolute: 0.1 10*3/uL (ref 0.0–0.7)
Eosinophils Relative: 1 %
HEMATOCRIT: 37 % (ref 36.0–46.0)
Hemoglobin: 12.8 g/dL (ref 12.0–15.0)
LYMPHS PCT: 21 %
Lymphs Abs: 1.4 10*3/uL (ref 0.7–4.0)
MCH: 32.8 pg (ref 26.0–34.0)
MCHC: 34.6 g/dL (ref 30.0–36.0)
MCV: 94.9 fL (ref 78.0–100.0)
MONO ABS: 0.5 10*3/uL (ref 0.1–1.0)
Monocytes Relative: 8 %
NEUTROS ABS: 4.7 10*3/uL (ref 1.7–7.7)
NEUTROS PCT: 70 %
RBC: 3.9 MIL/uL (ref 3.87–5.11)
RDW: 13.3 % (ref 11.5–15.5)
WBC: 6.7 10*3/uL (ref 4.0–10.5)

## 2016-04-27 LAB — BASIC METABOLIC PANEL
ANION GAP: 9 (ref 5–15)
BUN: 27 mg/dL — ABNORMAL HIGH (ref 6–20)
CO2: 26 mmol/L (ref 22–32)
Calcium: 9.5 mg/dL (ref 8.9–10.3)
Chloride: 102 mmol/L (ref 101–111)
Creatinine, Ser: 0.8 mg/dL (ref 0.44–1.00)
GLUCOSE: 176 mg/dL — AB (ref 65–99)
POTASSIUM: 3.6 mmol/L (ref 3.5–5.1)
Sodium: 137 mmol/L (ref 135–145)

## 2016-04-27 LAB — SEDIMENTATION RATE: Sed Rate: 107 mm/hr — ABNORMAL HIGH (ref 0–22)

## 2016-04-27 LAB — CBG MONITORING, ED: Glucose-Capillary: 225 mg/dL — ABNORMAL HIGH (ref 65–99)

## 2016-04-27 LAB — C-REACTIVE PROTEIN: CRP: 14.1 mg/dL — ABNORMAL HIGH (ref <1.0)

## 2016-04-27 MED ORDER — ONDANSETRON HCL 4 MG PO TABS
4.0000 mg | ORAL_TABLET | Freq: Four times a day (QID) | ORAL | Status: DC | PRN
  Administered 2016-04-28 – 2016-04-29 (×3): 4 mg via ORAL
  Filled 2016-04-27 (×3): qty 1

## 2016-04-27 MED ORDER — ALLOPURINOL 100 MG PO TABS
100.0000 mg | ORAL_TABLET | Freq: Every day | ORAL | Status: DC
  Administered 2016-04-28 – 2016-05-01 (×4): 100 mg via ORAL
  Filled 2016-04-27 (×4): qty 1

## 2016-04-27 MED ORDER — IOPAMIDOL (ISOVUE-370) INJECTION 76%
100.0000 mL | Freq: Once | INTRAVENOUS | Status: AC | PRN
  Administered 2016-04-27: 100 mL via INTRAVENOUS

## 2016-04-27 MED ORDER — LEVOTHYROXINE SODIUM 75 MCG PO TABS
175.0000 ug | ORAL_TABLET | Freq: Every day | ORAL | Status: DC
  Administered 2016-04-28 – 2016-05-02 (×5): 175 ug via ORAL
  Filled 2016-04-27 (×5): qty 1

## 2016-04-27 MED ORDER — SODIUM CHLORIDE 0.9 % IV SOLN
INTRAVENOUS | Status: DC
  Administered 2016-04-28 – 2016-05-01 (×8): via INTRAVENOUS

## 2016-04-27 MED ORDER — PRENATAL PLUS 27-1 MG PO TABS
1.0000 | ORAL_TABLET | Freq: Every day | ORAL | Status: DC
  Administered 2016-04-28 – 2016-05-02 (×5): 1 via ORAL
  Filled 2016-04-27 (×5): qty 1

## 2016-04-27 MED ORDER — ACETAMINOPHEN 325 MG PO TABS: 650.0000 mg | ORAL_TABLET | Freq: Four times a day (QID) | ORAL | Status: DC | PRN

## 2016-04-27 MED ORDER — ALBUTEROL SULFATE (2.5 MG/3ML) 0.083% IN NEBU: 2.5000 mg | INHALATION_SOLUTION | Freq: Four times a day (QID) | RESPIRATORY_TRACT | Status: DC | PRN

## 2016-04-27 MED ORDER — HYDROMORPHONE HCL 2 MG/ML IJ SOLN
1.0000 mg | Freq: Once | INTRAMUSCULAR | Status: AC
  Administered 2016-04-27: 1 mg via INTRAVENOUS
  Filled 2016-04-27: qty 1

## 2016-04-27 MED ORDER — ONDANSETRON HCL 4 MG/2ML IJ SOLN
4.0000 mg | Freq: Four times a day (QID) | INTRAMUSCULAR | Status: DC | PRN
  Administered 2016-04-28 (×2): 4 mg via INTRAVENOUS
  Filled 2016-04-27 (×2): qty 2

## 2016-04-27 MED ORDER — IOPAMIDOL (ISOVUE-370) INJECTION 76%
INTRAVENOUS | Status: AC
  Filled 2016-04-27: qty 100

## 2016-04-27 MED ORDER — FLUTICASONE PROPIONATE 50 MCG/ACT NA SUSP
1.0000 | Freq: Every day | NASAL | Status: DC
  Administered 2016-04-29 – 2016-05-01 (×2): 1 via NASAL
  Filled 2016-04-27 (×2): qty 16

## 2016-04-27 MED ORDER — ONDANSETRON HCL 4 MG/2ML IJ SOLN
4.0000 mg | Freq: Once | INTRAMUSCULAR | Status: AC
  Administered 2016-04-27: 4 mg via INTRAVENOUS
  Filled 2016-04-27: qty 2

## 2016-04-27 MED ORDER — VANCOMYCIN HCL 10 G IV SOLR
1500.0000 mg | Freq: Once | INTRAVENOUS | Status: AC
  Administered 2016-04-28: 1500 mg via INTRAVENOUS
  Filled 2016-04-27: qty 1500

## 2016-04-27 MED ORDER — METOPROLOL SUCCINATE ER 25 MG PO TB24
25.0000 mg | ORAL_TABLET | Freq: Every day | ORAL | Status: DC
  Administered 2016-04-28 – 2016-05-02 (×5): 25 mg via ORAL
  Filled 2016-04-27 (×5): qty 1

## 2016-04-27 MED ORDER — HYDROMORPHONE HCL 2 MG/ML IJ SOLN: 1.0000 mg | INTRAMUSCULAR | Status: DC | PRN

## 2016-04-27 MED ORDER — ACETAMINOPHEN 650 MG RE SUPP: 650.0000 mg | Freq: Four times a day (QID) | RECTAL | Status: DC | PRN

## 2016-04-27 MED ORDER — TORSEMIDE 20 MG PO TABS
10.0000 mg | ORAL_TABLET | Freq: Every day | ORAL | Status: DC
  Administered 2016-04-28 – 2016-05-02 (×5): 10 mg via ORAL
  Filled 2016-04-27 (×5): qty 1

## 2016-04-27 MED ORDER — DULOXETINE HCL 30 MG PO CPEP
30.0000 mg | ORAL_CAPSULE | Freq: Every day | ORAL | Status: DC
  Administered 2016-04-28 – 2016-05-01 (×4): 30 mg via ORAL
  Filled 2016-04-27 (×4): qty 1

## 2016-04-27 MED ORDER — PROCHLORPERAZINE EDISYLATE 5 MG/ML IJ SOLN
5.0000 mg | Freq: Once | INTRAMUSCULAR | Status: AC
  Administered 2016-04-27: 5 mg via INTRAVENOUS
  Filled 2016-04-27: qty 2

## 2016-04-27 MED ORDER — RAMIPRIL 2.5 MG PO CAPS
2.5000 mg | ORAL_CAPSULE | Freq: Every day | ORAL | Status: DC
  Administered 2016-04-28 – 2016-05-01 (×4): 2.5 mg via ORAL
  Filled 2016-04-27 (×6): qty 1

## 2016-04-27 MED ORDER — OXYCODONE HCL 5 MG PO TABS
5.0000 mg | ORAL_TABLET | ORAL | Status: DC | PRN
  Administered 2016-04-28 – 2016-05-01 (×8): 5 mg via ORAL
  Filled 2016-04-27 (×8): qty 1

## 2016-04-27 MED ORDER — PIPERACILLIN-TAZOBACTAM 3.375 G IVPB 30 MIN
3.3750 g | Freq: Once | INTRAVENOUS | Status: AC
  Administered 2016-04-27: 3.375 g via INTRAVENOUS
  Filled 2016-04-27: qty 50

## 2016-04-27 NOTE — ED Triage Notes (Signed)
Patient sent from Wound Care center at [facility] for gangrene 1st, 2nd, 3rd toes on right foot. patient having severe pain and pulse non palpable below femoral. Patient is diabetic and has neuropathy

## 2016-04-27 NOTE — Telephone Encounter (Signed)
Pt went to ED. It looks like she has necrotic areas of skin. Will see what they give her pain and if she needs to be admitted. If not admitted will need to get her in with wound care quickly. I might rx low dose lyrica but would do so hesitantly since she has hx of side effect. Would you call pt and see how she is?

## 2016-04-27 NOTE — ED Provider Notes (Signed)
11:18 PM Patient is 66 year old female presenting with wound infection to her right foot. Areas of necrosis, surrounding erythema. Concern for infection versus ischemia. CTA shows acute versus subacute femoral artery superficial clot. Discussed with Dr. Scot Dock who came to see patient. He agrees with admission and he will follow. Broad-spectrum antibiotics started and will admit to [facility].    Alexis Molinari Julio Alm, MD 04/27/16 [phone]

## 2016-04-27 NOTE — ED Provider Notes (Signed)
[facility] DEPT Provider Note   CSN: [id number] Arrival date & time: 04/27/16  1636     History   Chief Complaint Chief Complaint  Patient presents with  . sent from wound center gangrene toes    HPI Alexis [name] is a 66 y.o. female who presents to the ED with Foot infection. She states that her foot began peeling about 3 weeks ago after she is using double tearing cream on her toes for her diabetic neuropathy. She states it only started on the right side. Patient was admitted about 2 weeks ago for Norco virus. During that time her foot being treated with cortisone cream. She states that it improved significantly and she continued using it at home. However, her pain became worse. She was sent to the wound care center where her doctor began treating her with nystatin cream. She states that after that her foot began healing, weeping, bleeding became extremely painful. She states that they did check her pulses and she did have a pulse behind her right ankle bone but not on the top of her foot. She denies fevers or chills. She states that her foot feels like it is "on fire.  HPI  Past Medical History:  Diagnosis Date  . Allergy   . Anxiety   . Arthritis   . Asthma   . Atrial fibrillation ([place])   . CHF (congestive heart failure) ([place])   . COPD (chronic obstructive pulmonary disease) ([place])   . Diabetes mellitus without complication ([place])   . Hyperlipidemia   . Hypertension   . Myocardial infarction    several  . Pacemaker    CRT-P with RV lead and His Bundle lead ( high threshold)   . Peripheral neuropathy ([place])   . Thyroid disease     Patient Active Problem List   Diagnosis Date Noted  . Persistent atrial fibrillation ([place])   . Thrombocytopenia ([place])   . Vomiting and diarrhea 04/05/2016  . Hyperlipidemia 11/18/2015  . Gout 08/05/2014  . Cough 08/05/2014  . Diabetes ([place]) 07/12/2014  . Allergic rhinitis 07/12/2014  . Osteoarthritis 07/12/2014  . Asthma 07/12/2014    . CHF (congestive heart failure) ([place]) 07/12/2014  . CAD (coronary artery disease) 07/12/2014  . Atrial fibrillation with rapid ventricular response ([place]) 07/12/2014  . HTN (hypertension) 07/12/2014  . Hypothyroidism 07/12/2014  . History of substance abuse 07/12/2014  . Peripheral vascular disease ([place]) 04/02/2013  . Discoloration of skin 04/02/2013    Past Surgical History:  Procedure Laterality Date  . RADIOFREQUENCY ABLATION    . TOTAL KNEE ARTHROPLASTY      OB History    No data available       Home Medications    Prior to Admission medications   Medication Sig Start Date End Date Taking? Authorizing Provider  albuterol (PROVENTIL HFA;VENTOLIN HFA) 108 (90 Base) MCG/ACT inhaler Inhale 2 puffs into the lungs every 6 (six) hours as needed for wheezing or shortness of breath. 02/10/16   Percell Miller Saguier, PA-C  albuterol (PROVENTIL) (2.5 MG/3ML) 0.083% nebulizer solution Take 3 mLs (2.5 mg total) by nebulization every 6 (six) hours as needed for wheezing or shortness of breath. 02/10/16   Percell Miller Saguier, PA-C  allopurinol (ZYLOPRIM) 100 MG tablet Take ONE (1) tablet by mouth once daily Patient taking differently: Take 100 mg by mouth at bedtime.  09/18/15   Percell Miller Saguier, PA-C  apixaban (ELIQUIS) 5 MG TABS tablet Take 1 tablet (5 mg total) by mouth 2 (two) times daily. 11/17/15  Jettie Booze, MD  atorvastatin (LIPITOR) 10 MG tablet Take 1 tablet (10 mg total) by mouth daily. Patient not taking: Reported on 04/05/2016 08/28/14   Mackie Pai, PA-C  azelastine (ASTELIN) 0.1 % nasal spray USE 2 SPRAYS IN EACH NOSTRIL TWICE DAILY AS DIRECTED 04/19/16   Mackie Pai, PA-C  Blood Glucose Monitoring Suppl (GLUCOCOM BLOOD GLUCOSE MONITOR) DEVI Use to check blood sugars 6 times daily E11.65 05/01/15   Historical Provider, MD  DULoxetine (CYMBALTA) 30 MG capsule Take 1 capsule (30 mg total) by mouth 2 (two) times daily. Patient taking differently: Take 30 mg by mouth at bedtime.   02/25/16   Percell Miller Saguier, PA-C  fluticasone (FLONASE) 50 MCG/ACT nasal spray Place 1 spray into both nostrils daily. Patient taking differently: Place 1 spray into both nostrils at bedtime. Place 1 spray into both nostrils daily. 09/16/15   Percell Miller Saguier, PA-C  fluticasone (FLOVENT HFA) 110 MCG/ACT inhaler Inhale 2 puffs into the lungs 2 (two) times daily. 12/16/15   Percell Miller Saguier, PA-C  Insulin Disposable Pump (V-GO 40) KIT Inject into the skin See admin instructions. Use with Novolog - 6 pumps before each meal 11/21/15   Historical Provider, MD  Insulin Pen Needle (B-D ULTRAFINE III SHORT PEN) 31G X 8 MM MISC Use for insulin pen up to five times daily E11.65 05/01/15   Historical Provider, MD  levothyroxine (SYNTHROID, LEVOTHROID) 175 MCG tablet Take 175 mcg by mouth daily before breakfast.    Historical Provider, MD  metoprolol succinate (TOPROL-XL) 25 MG 24 hr tablet Take 1 tablet by mouth daily. 02/09/16   Historical Provider, MD  naproxen sodium (ALEVE) 220 MG tablet Take 440 mg by mouth every 12 (twelve) hours.    Historical Provider, MD  naproxen sodium (ALEVE) 220 MG tablet Take 440 mg by mouth 2 (two) times daily.    Historical Provider, MD  Neo-Bacit-Poly-Lidocaine (TRIPLE ANTIBIOTIC/LIDOCAINE EX) Apply 1 application topically 2 (two) times daily.    Historical Provider, MD  Prenatal Vit-Fe Fumarate-FA (MULTIVITAMIN-PRENATAL) 27-0.8 MG TABS tablet Take 1 tablet by mouth daily. Takes for nail and hair growth     Historical Provider, MD  ramipril (ALTACE) 2.5 MG capsule TAKE 1 CAPSULE ONE TIME DAILY 04/19/16   Mackie Pai, PA-C  spironolactone (ALDACTONE) 25 MG tablet Take ONE (1) tablet by mouth once daily Patient taking differently: Take 25 mg by mouth daily.  09/18/15   Percell Miller Saguier, PA-C  torsemide (DEMADEX) 10 MG tablet TAKE 1 TABLET EVERY DAY 04/19/16   Mackie Pai, PA-C    Family History Family History  Problem Relation Age of Onset  . Diabetes Mother   . Hyperlipidemia  Mother   . Diabetes Father   . Hyperlipidemia Father   . Heart disease Father     before age 70  . Heart attack Father   . Diabetes Brother   . Hyperlipidemia Brother   . Heart attack Brother     Social History Social History  Substance Use Topics  . Smoking status: Former Smoker    Quit date: 03/01/1998  . Smokeless tobacco: Never Used  . 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     Allergies   Indomethacin; Pregabalin; Sulfa antibiotics; and Sulfamethoxazole   Review of Systems Review of Systems  Ten systems reviewed and are negative for acute change, except as noted in the HPI.    Physical Exam Updated Vital Signs BP 146/83 (BP Location: Right Arm)  Pulse 60   Temp 98.1 F (36.7 C) (Oral)   Resp 12   SpO2 99%   Physical Exam  Constitutional: She is oriented to person, place, and time. She appears well-developed and well-nourished. No distress.  HENT:  Head: Normocephalic and atraumatic.  Eyes: Conjunctivae are normal. No scleral icterus.  Neck: Normal range of motion.  Cardiovascular: Normal rate, regular rhythm and normal heart sounds.  Exam reveals no gallop and no friction rub.   No murmur heard. Pulmonary/Chest: Effort normal and breath sounds normal. No respiratory distress.  Abdominal: Soft. Bowel sounds are normal. She exhibits no distension and no mass. There is no tenderness. There is no guarding.  Neurological: She is alert and oriented to person, place, and time.  Skin: She is not diaphoretic.  Right toes desquamated with multiple areas of black necrotic skin. Skin is weeping. Erythematous. Exquisitely tender to palpation. No DP pulse present by Doppler. There is a faint monophasic pulse on the right, posterior tibialis.     ED Treatments / Results  Labs (all labs ordered are listed, but only abnormal results are displayed) Labs Reviewed  CBG MONITORING, ED - Abnormal; Notable for the following:        Result Value   Glucose-Capillary 225 (*)    All other components within normal limits  CULTURE, BLOOD (ROUTINE X 2)  CULTURE, BLOOD (ROUTINE X 2)  BASIC METABOLIC PANEL  CBC WITH DIFFERENTIAL/PLATELET  SEDIMENTATION RATE  C-REACTIVE PROTEIN    EKG  EKG Interpretation None       Radiology No results found.  Procedures Procedures (including critical care time)  Medications Ordered in ED Medications  HYDROmorphone (DILAUDID) injection 1 mg (not administered)  ondansetron (ZOFRAN) injection 4 mg (not administered)     Initial Impression / Assessment and Plan / ED Course  I have reviewed the triage vital signs and the nursing notes.  Pertinent labs & imaging results that were available during my care of the patient were reviewed by me and considered in my medical decision making (see chart for details).     Patient with superficial of all artery thrombus. She also has significantly painful foot with gangrenous toes. Patient has been seen by Dr. Doren Custard. Patient also seen in shared visit with Dr. Ellie Lunch. The patient will be admitted. IV antibiotics started.  Final Clinical Impressions(s) / ED Diagnoses   Final diagnoses:  Foot infection    New Prescriptions New Prescriptions   No medications on file     Margarita Mail, PA-C 04/28/16 [place], MD 04/28/16 [id number]

## 2016-04-27 NOTE — ED Notes (Signed)
X-ray at bedside

## 2016-04-27 NOTE — ED Notes (Signed)
Bed: [facility] Expected date:  Expected time:  Means of arrival:  Comments: 

## 2016-04-27 NOTE — Consult Note (Signed)
Patient name: Alexis [name] MRN: [id number] DOB: 09/29/1950 Sex: female  REASON FOR CONSULT: Gangrenous toes right foot. Consult is from the [facility] emergency department.  HPI: Alexis [name] is a 66 y.o. female, who developed a gangrenous eschar on the medial aspect of her left great toe and a small gangrenous area on her right fifth toe. She was being seen at the [facility] wound care center. She is not sure exactly what was being put on the toes to treat her wounds but she developed a significant rash involving all 5 of her toes on the right foot with significant worsening of the toes. She was seen at the wound care center today and told to go to the emergency department.  Prior to developing these wounds she does admit to a 6 month history of right calf claudication. Her symptoms are brought on by ambulation and relieved with rest. Her symptoms are limited to the calf. She has no symptoms in the left leg. She denies any history of rest pain.  Her risk factors for peripheral vascular disease include diabetes, hypertension, hyperlipidemia, and a remote history of tobacco use. She denies any family history of premature cardiovascular disease.  She is on Eliquis for atrial fibrillation. Her cardiologist is Dr. Irish Lack.  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  Past Medical History:  Diagnosis Date  . Allergy   . Anxiety   . Arthritis   . Asthma   . Atrial fibrillation ([place])   . CHF (congestive heart failure) ([place])   . COPD (chronic obstructive pulmonary disease) ([place])   . Diabetes mellitus without complication ([place])   . Hyperlipidemia   . Hypertension   . Myocardial infarction    several  . Pacemaker    CRT-P with RV lead and His Bundle lead ( high threshold)   . Peripheral neuropathy ([place])   . Thyroid disease     Family History  Problem Relation Age of Onset  . Diabetes Mother   . Hyperlipidemia Mother   .  Diabetes Father   . Hyperlipidemia Father   . Heart disease Father     before age 20  . Heart attack Father   . Diabetes Brother   . Hyperlipidemia Brother   . Heart attack Brother     SOCIAL HISTORY: Social History   Social History  . Marital status: Married    Spouse name: N/A  . Number of children: N/A  . Years of education: N/A   Occupational History  . retired    Social History Main Topics  . Smoking status: Former Smoker    Quit date: 03/01/1998  . Smokeless tobacco: Never Used  . 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  . Drug use: No     Comment: remote use  . Sexual activity: Not on file   Other Topics Concern  . Not on file   Social History Narrative  . No narrative on file    Allergies  Allergen Reactions  . Indomethacin Other (See Comments)     Renal Insufficiency  . Pregabalin Other (See Comments)    Other reaction(s): DIZZINESS   . Sulfa Antibiotics Hives  . Sulfamethoxazole Hives    Current Facility-Administered Medications  Medication Dose Route Frequency Provider Last Rate Last Dose  . iopamidol (ISOVUE-370) 76 % injection           .  piperacillin-tazobactam (ZOSYN) IVPB 3.375 g  3.375 g Intravenous Once Courteney Lyn Mackuen, MD      . vancomycin (VANCOCIN) 1,500 mg in sodium chloride 0.9 % 500 mL IVPB  1,500 mg Intravenous Once Courteney Lyn Mackuen, MD       Current Outpatient Prescriptions  Medication Sig Dispense Refill  . albuterol (PROVENTIL HFA;VENTOLIN HFA) 108 (90 Base) MCG/ACT inhaler Inhale 2 puffs into the lungs every 6 (six) hours as needed for wheezing or shortness of breath. 1 Inhaler 2  . albuterol (PROVENTIL) (2.5 MG/3ML) 0.083% nebulizer solution Take 3 mLs (2.5 mg total) by nebulization every 6 (six) hours as needed for wheezing or shortness of breath. 150 mL 1  . allopurinol (ZYLOPRIM) 100 MG tablet Take ONE (1) tablet by mouth once daily (Patient taking differently: Take 100 mg  by mouth at bedtime. ) 90 tablet 3  . apixaban (ELIQUIS) 5 MG TABS tablet Take 1 tablet (5 mg total) by mouth 2 (two) times daily. 60 tablet 6  . DULoxetine (CYMBALTA) 30 MG capsule Take 1 capsule (30 mg total) by mouth 2 (two) times daily. (Patient taking differently: Take 30 mg by mouth at bedtime. ) 60 capsule 1  . fluticasone (FLONASE) 50 MCG/ACT nasal spray Place 1 spray into both nostrils daily. (Patient taking differently: Place 1 spray into both nostrils at bedtime. Place 1 spray into both nostrils daily.) 16 g 3  . fluticasone (FLOVENT HFA) 110 MCG/ACT inhaler Inhale 2 puffs into the lungs 2 (two) times daily. (Patient taking differently: Inhale 2 puffs into the lungs as needed (bronchitis). ) 1 Inhaler 3  . Insulin Disposable Pump (V-GO 40) KIT Inject into the skin See admin instructions. Use with Novolog - 6 pumps before each meal    . levothyroxine (SYNTHROID, LEVOTHROID) 175 MCG tablet Take 175 mcg by mouth daily before breakfast.    . metoprolol succinate (TOPROL-XL) 25 MG 24 hr tablet Take 1 tablet by mouth daily.    . naproxen sodium (ALEVE) 220 MG tablet Take 440 mg by mouth every 12 (twelve) hours.    . Prenatal Vit-Fe Fumarate-FA (MULTIVITAMIN-PRENATAL) 27-0.8 MG TABS tablet Take 1 tablet by mouth daily. Takes for nail and hair growth     . ramipril (ALTACE) 2.5 MG capsule TAKE 1 CAPSULE ONE TIME DAILY 90 capsule 1  . torsemide (DEMADEX) 10 MG tablet TAKE 1 TABLET EVERY DAY 90 tablet 1  . atorvastatin (LIPITOR) 10 MG tablet Take 1 tablet (10 mg total) by mouth daily. (Patient not taking: Reported on 04/05/2016) 30 tablet 0  . azelastine (ASTELIN) 0.1 % nasal spray USE 2 SPRAYS IN EACH NOSTRIL TWICE DAILY AS DIRECTED (Patient not taking: Reported on 04/27/2016) 120 mL 3  . Blood Glucose Monitoring Suppl (GLUCOCOM BLOOD GLUCOSE MONITOR) DEVI Use to check blood sugars 6 times daily E11.65    . Insulin Pen Needle (B-D ULTRAFINE III SHORT PEN) 31G X 8 MM MISC Use for insulin pen up to five  times daily E11.65    . spironolactone (ALDACTONE) 25 MG tablet Take ONE (1) tablet by mouth once daily (Patient not taking: Reported on 04/27/2016) 90 tablet 1    REVIEW OF SYSTEMS:  [X]  denotes positive finding, [ ]  denotes negative finding Cardiac  Comments:  Chest pain or chest pressure:    Shortness of breath upon exertion:    Short of breath when lying flat:    Irregular heart rhythm:        Vascular    Pain in calf,  thigh, or hip brought on by ambulation: X Right calf   Pain in feet at night that wakes you up from your sleep:     Blood clot in your veins:    Leg swelling:         Pulmonary    Oxygen at home:    Productive cough:     Wheezing:  X       Neurologic    Sudden weakness in arms or legs:     Sudden numbness in arms or legs:     Sudden onset of difficulty speaking or slurred speech:    Temporary loss of vision in one eye:     Problems with dizziness:         Gastrointestinal    Blood in stool:     Vomited blood:         Genitourinary    Burning when urinating:     Blood in urine:        Psychiatric    Major depression:         Hematologic    Bleeding problems:    Problems with blood clotting too easily:        Skin    Rashes or ulcers:        Constitutional    Fever or chills:      PHYSICAL EXAM: Vitals:   04/27/16 1646 04/27/16 1951 04/27/16 2211  BP: 146/83 158/97 108/55  Pulse: 60 87 60  Resp: 12 18 18   Temp: 98.1 F (36.7 C) 98.2 F (36.8 C)   TempSrc: Oral Oral   SpO2: 99% 99% 94%    GENERAL: The patient is a well-nourished female, in no acute distress. The vital signs are documented above. CARDIAC: There is a regular rate and rhythm.  VASCULAR: I do not detect carotid bruits. On the right side, which is the symptomatic side, she has a slightly diminished femoral pulse. Her femoral pulse is difficult to assess because of her obesity. I cannot palpate a popliteal or pedal pulses on the right. On the right side, she does have a  monophasic dorsalis pedis, posterior tibial, and peroneal signal. On the left side, she has a diminished femoral pulse. Again it is difficult to palpate her femoral pulses because of her obesity. I cannot palpate a popliteal or pedal pulses on the left. She does have a biphasic dorsalis pedis signal and a biphasic posterior tibial signal on the left. She has no significant lower extremity swelling. PULMONARY: There is good air exchange bilaterally without wheezing or rales. ABDOMEN: Soft and non-tender with normal pitched bowel sounds. She has a large pannus. MUSCULOSKELETAL: There are no major deformities or cyanosis. NEUROLOGIC: No focal weakness or paresthesias are detected. SKIN: She has a rash over her forefoot with gangrenous changes of all the toes of her right foot. PSYCHIATRIC: The patient has a normal affect.  DATA:   LABS: Her creatinine is 0.8. Creatinine clearance is greater than 60. White blood cell count is 6.7. Platelets pending. Hemoglobin 12.8.  In February 2015, she had an arterial Doppler study which showed an ABI of 100% bilaterally with a toe pressure of 170 mmHg on the right and 112 mmHg on the left. She had triphasic Doppler signals in both feet at that time.  CT ANGIOGRAM RIGHT LOWER EXTREMITY: The CT angiogram was interpreted as showing acute thrombus or embolus in the distal right superficial femoral artery. 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 In addition her history does not suggest an acute arterial occlusion and the patient is on twice a day Eliquis.   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 This is clearly a limb threatening situation. I would recommend  admission for intravenous antibiotics. Her Eliquis will need to be held. Her diabetes will need to be managed closely. Once her Eliquis has been held for 48 hours and we'll try to get her scheduled for an arteriogram on Thursday and possible revascularization on Friday pending the results of her arteriogram. I will also order a vein map of the right great saphenous vein when she gets to [facility]. I have discussed the seriousness of her wound with the patient and she understands that this is a limb threatening situation with significant risk for limb loss.   [name] [facility] [phone]

## 2016-04-27 NOTE — ED Notes (Signed)
ED Provider at bedside. 

## 2016-04-27 NOTE — ED Notes (Addendum)
EDP at bedside attempting to find pulse on right foot with doppler. Right posterior tibial doppler pulse present.

## 2016-04-27 NOTE — H&P (Signed)
History and Physical    YOUNG BRIM QJF:[id number] DOB: 03/12/1950 DOA: 04/27/2016  Referring MD/NP/PA: Margarita Mail PA-C PCP: Mackie Pai, PA-C  Patient coming from: [facility] wound care center  Chief Complaint: Infected wound of foot   HPI: Alexis [name] is a 66 y.o. female with medical history significant of HTN, HLD, PVD, A.fib  on Eliquis, DM type II on insulin pump, CHF, COPD, s/p PM, and MI; who presents from wound care clinic for worsening right foot wound. Patient notes symptoms have progressively worsened over the past 1-1/2 months. Patient reports being seen by her primary care provider who initially sent her to podiatry. The podiatrist felt that the patient need to be seen by vascular surgeon, but patient no show for this appointment and requested referral to wound care. Patient previously had been using Voltran gel with some mild relief initially, hydrocortisone cream, and possibly nystatin cream. Associated symptoms that progressively worsened include pain with ambulation in the right foot, worsening peeling, and erythema. The patient was sent to wound care clinic and reports initially being recommended continue the same. However, yesterday patient was noted to have new necrotic appearing areas on the first and fifth toe for which she was referred to the hospital for further evaluation. Review of records shows that patient was noted to have claudication symptoms during her initial podiatry evaluation, which arterial studies were recommended.    ED Course: Upon admission into the emergency department patient was seen to be afebrile with normal vital signs. Lab work revealed BUN 27, creatinine 0.8, and all other lab work within normal limits. CT angiogram of the right lower extremity revealed acute thrombus or embolus of the distal right superficial femoral artery resulting in complete occlusion of the vessel reconstitution with three-vessel runoff noted in the right calf.  Vascular surgery was consulted and recommended admission to [facility] at [facility] with empiric antibiotics. Patient was started on vancomycin and Zosyn.  Review of Systems: As per HPI otherwise 10 point review of systems negative.   Past Medical History:  Diagnosis Date  . Allergy   . Anxiety   . Arthritis   . Asthma   . Atrial fibrillation ([place])   . CHF (congestive heart failure) ([place])   . COPD (chronic obstructive pulmonary disease) ([place])   . Diabetes mellitus without complication ([place])   . Hyperlipidemia   . Hypertension   . Myocardial infarction    several  . Pacemaker    CRT-P with RV lead and His Bundle lead ( high threshold)   . Peripheral neuropathy ([place])   . Thyroid disease     Past Surgical History:  Procedure Laterality Date  . RADIOFREQUENCY ABLATION    . TOTAL KNEE ARTHROPLASTY       reports that she quit smoking about 18 years ago. She has never used smokeless tobacco. She reports that she does not drink alcohol or use drugs.  Allergies  Allergen Reactions  . Indomethacin Other (See Comments)     Renal Insufficiency  . Pregabalin Other (See Comments)    Other reaction(s): DIZZINESS   . Sulfa Antibiotics Hives  . Sulfamethoxazole Hives    Family History  Problem Relation Age of Onset  . Diabetes Mother   . Hyperlipidemia Mother   . Diabetes Father   . Hyperlipidemia Father   . Heart disease Father     before age 66  . Heart attack Father   . Diabetes Brother   . Hyperlipidemia Brother   . Heart  attack Brother     Prior to Admission medications   Medication Sig Start Date End Date Taking? Authorizing Provider  albuterol (PROVENTIL HFA;VENTOLIN HFA) 108 (90 Base) MCG/ACT inhaler Inhale 2 puffs into the lungs every 6 (six) hours as needed for wheezing or shortness of breath. 02/10/16  Yes Edward Saguier, PA-C  albuterol (PROVENTIL) (2.5 MG/3ML) 0.083% nebulizer solution Take 3 mLs (2.5 mg total) by nebulization every 6 (six) hours as needed for  wheezing or shortness of breath. 02/10/16  Yes Edward Saguier, PA-C  allopurinol (ZYLOPRIM) 100 MG tablet Take ONE (1) tablet by mouth once daily Patient taking differently: Take 100 mg by mouth at bedtime.  09/18/15  Yes Edward Saguier, PA-C  apixaban (ELIQUIS) 5 MG TABS tablet Take 1 tablet (5 mg total) by mouth 2 (two) times daily. 11/17/15  Yes Jettie Booze, MD  DULoxetine (CYMBALTA) 30 MG capsule Take 1 capsule (30 mg total) by mouth 2 (two) times daily. Patient taking differently: Take 30 mg by mouth at bedtime.  02/25/16  Yes Edward Saguier, PA-C  fluticasone (FLONASE) 50 MCG/ACT nasal spray Place 1 spray into both nostrils daily. Patient taking differently: Place 1 spray into both nostrils at bedtime. Place 1 spray into both nostrils daily. 09/16/15  Yes Edward Saguier, PA-C  fluticasone (FLOVENT HFA) 110 MCG/ACT inhaler Inhale 2 puffs into the lungs 2 (two) times daily. Patient taking differently: Inhale 2 puffs into the lungs as needed (bronchitis).  12/16/15  Yes Edward Saguier, PA-C  Insulin Disposable Pump (V-GO 40) KIT Inject into the skin See admin instructions. Use with Novolog - 6 pumps before each meal 11/21/15  Yes Historical Provider, MD  levothyroxine (SYNTHROID, LEVOTHROID) 175 MCG tablet Take 175 mcg by mouth daily before breakfast.   Yes Historical Provider, MD  metoprolol succinate (TOPROL-XL) 25 MG 24 hr tablet Take 1 tablet by mouth daily. 02/09/16  Yes Historical Provider, MD  naproxen sodium (ALEVE) 220 MG tablet Take 440 mg by mouth every 12 (twelve) hours.   Yes Historical Provider, MD  Prenatal Vit-Fe Fumarate-FA (MULTIVITAMIN-PRENATAL) 27-0.8 MG TABS tablet Take 1 tablet by mouth daily. Takes for nail and hair growth    Yes Historical Provider, MD  ramipril (ALTACE) 2.5 MG capsule TAKE 1 CAPSULE ONE TIME DAILY 04/19/16  Yes Mackie Pai, PA-C  torsemide (DEMADEX) 10 MG tablet TAKE 1 TABLET EVERY DAY 04/19/16  Yes Edward Saguier, PA-C  atorvastatin (LIPITOR) 10 MG  tablet Take 1 tablet (10 mg total) by mouth daily. Patient not taking: Reported on 04/05/2016 08/28/14   Mackie Pai, PA-C  azelastine (ASTELIN) 0.1 % nasal spray USE 2 SPRAYS IN EACH NOSTRIL TWICE DAILY AS DIRECTED Patient not taking: Reported on 04/27/2016 04/19/16   Mackie Pai, PA-C  Blood Glucose Monitoring Suppl (GLUCOCOM BLOOD GLUCOSE MONITOR) DEVI Use to check blood sugars 6 times daily E11.65 05/01/15   Historical Provider, MD  Insulin Pen Needle (B-D ULTRAFINE III SHORT PEN) 31G X 8 MM MISC Use for insulin pen up to five times daily E11.65 05/01/15   Historical Provider, MD  spironolactone (ALDACTONE) 25 MG tablet Take ONE (1) tablet by mouth once daily Patient not taking: Reported on 04/27/2016 09/18/15   Mackie Pai, PA-C    Physical Exam:  Constitutional:Obese female in NAD, calm, comfortable Vitals:   04/27/16 1646 04/27/16 1951 04/27/16 2211  BP: 146/83 158/97 108/55  Pulse: 60 87 60  Resp: 12 18 18   Temp: 98.1 F (36.7 C) 98.2 F (36.8 C)   TempSrc: Oral  Oral   SpO2: 99% 99% 94%   Eyes: PERRL, lids and conjunctivae normal ENMT: Mucous membranes are moist. Posterior pharynx clear of any exudate or lesions.  Neck: normal, supple, no masses, no thyromegaly Respiratory: clear to auscultation bilaterally, no wheezing, no crackles. Normal respiratory effort. No accessory muscle use.  Cardiovascular: Irregular irregular no murmurs / rubs / gallops. No extremity edema. 2+ pedal pulses. No carotid bruits.  Abdomen: no tenderness, no masses palpated. No hepatosplenomegaly. Bowel sounds positive.  Musculoskeletal: no clubbing / cyanosis. No joint deformity upper and lower extremities. Good ROM, no contractures. Normal muscle tone.  Skin: Desquamous and of skin with erythema with necrotic eschar noted of the toes on the right foot significantly tender to palpation as seemed below.   Neurologic: CN 2-12 grossly intact. Sensation intact, DTR normal. Strength 5/5 in all 4.    Psychiatric: Normal judgment and insight. Alert and oriented x 3. Normal mood.     Labs on Admission: I have personally reviewed following labs and imaging studies  CBC:  Recent Labs Lab 04/27/16 1943  WBC 6.7  NEUTROABS 4.7  HGB 12.8  HCT 37.0  MCV 94.9  PLT SPECIMEN CHECKED FOR CLOTS   Basic Metabolic Panel:  Recent Labs Lab 04/27/16 1943  NA 137  K 3.6  CL 102  CO2 26  GLUCOSE 176*  BUN 27*  CREATININE 0.80  CALCIUM 9.5   GFR: Estimated Creatinine Clearance: 76.8 mL/min (by C-G formula based on SCr of 0.8 mg/dL). Liver Function Tests: No results for input(s): AST, ALT, ALKPHOS, BILITOT, PROT, ALBUMIN in the last 168 hours. No results for input(s): LIPASE, AMYLASE in the last 168 hours. No results for input(s): AMMONIA in the last 168 hours. Coagulation Profile: No results for input(s): INR, PROTIME in the last 168 hours. Cardiac Enzymes: No results for input(s): CKTOTAL, CKMB, CKMBINDEX, TROPONINI in the last 168 hours. BNP (last 3 results)  Recent Labs  02/10/16 1219  PROBNP 60.0   HbA1C: No results for input(s): HGBA1C in the last 72 hours. CBG:  Recent Labs Lab 04/27/16 1814  GLUCAP 225*   Lipid Profile: No results for input(s): CHOL, HDL, LDLCALC, TRIG, CHOLHDL, LDLDIRECT in the last 72 hours. Thyroid Function Tests: No results for input(s): TSH, T4TOTAL, FREET4, T3FREE, THYROIDAB in the last 72 hours. Anemia Panel: No results for input(s): VITAMINB12, FOLATE, FERRITIN, TIBC, IRON, RETICCTPCT in the last 72 hours. Urine analysis:    Component Value Date/Time   COLORURINE YELLOW 04/05/2016 [place] 04/05/2016 1203   LABSPEC 1.022 04/05/2016 1203   PHURINE 6.0 04/05/2016 1203   GLUCOSEU 250 (A) 04/05/2016 1203   HGBUR NEGATIVE 04/05/2016 [place] 04/05/2016 [place] 04/05/2016 1203   PROTEINUR NEGATIVE 04/05/2016 1203   NITRITE NEGATIVE 04/05/2016 1203   LEUKOCYTESUR TRACE (A)  04/05/2016 1203   Sepsis Labs: No results found for this or any previous visit (from the past 240 hour(s)).   Radiological Exams on Admission: Ct Angio Low Extrem Right W &/or Wo Contrast  Result Date: 04/27/2016 CLINICAL DATA:  Right lower extremity gangrene and pain. EXAM: CT ANGIOGRAPHY OF THE right lowerEXTREMITY TECHNIQUE: Multidetector CT imaging of the right lowerwas performed using the standard protocol during bolus administration of intravenous contrast. Multiplanar CT image reconstructions and MIPs were obtained to evaluate the vascular anatomy. CONTRAST:  100 mL of Isovue 370 intravenously. COMPARISON:  None. FINDINGS: Extensive atherosclerosis is seen throughout the visualized arterial system of the right lower  extremity. Moderate to severe multifocal stenoses are noted throughout the visualized common femoral and superficial femoral artery as a result. There is complete occlusion of the distal right superficial femoral artery concerning for thrombus or acute embolus. Reconstitution of the right popliteal artery is noted, which also demonstrates multifocal stenoses secondary to atheromatous disease. Three vessel runoff is noted in the right calf to the ankle mortise. No osseous abnormality is noted. Review of the MIP images confirms the above findings. IMPRESSION: Extensive atheromatous disease is seen throughout the arterial circulation of right lower extremity. Moderate to severe multifocal stenoses are noted throughout the visualized right common femoral and superficial femoral arteries and popliteal artery as a result. Acute thrombus or embolus is noted in the distal right superficial femoral artery, resulting in complete occlusion of the vessel at this level. Reconstitution of the right popliteal artery is noted, with 3 vessel runoff noted in the right calf to the ankle mortise. Critical Value/emergent results were called by telephone at the time of interpretation on 04/27/2016 at 10:02 pm to  Dr. Margarita Mail , who verbally acknowledged these results. Electronically Signed   By: Marijo Conception, M.D.   On: 04/27/2016 22:03   Dg Foot 2 Views Right  Result Date: 04/27/2016 CLINICAL DATA:  Right foot infection.  Pain with foot wounds. EXAM: RIGHT FOOT - 2 VIEW COMPARISON:  Foot radiographs 07/12/2014 FINDINGS: There is no evidence of fracture or dislocation. No bony destructive change or abnormal density to suggest osteomyelitis. Plantar calcaneal spur. Mild osteoarthritis of the mid/hindfoot. Soft tissue edema suspected about the metatarsal phalangeal joints and digits. No evidence of soft tissue air or radiopaque foreign body. IMPRESSION: Soft tissue edema about the digits. No radiographic evidence of osteomyelitis. No radiopaque foreign body. Electronically Signed   By: Jeb Levering M.D.   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 Patient seen have discrimination of the feet with necrotic appearing areas. Vascular surgery consult and recommended empiric antibiotics. Patient scheduled to have arteriogram on Thursday and possible surgical procedure on Friday. - Admit Med[facility]urg bed at [facility] - Follow up blood cultures  - Continue empiric antibiotics of vancomycin and Zosyn - Oxycodone/Dilaudid IV prn moderate to severe pain - IVF NS 75 ml/hr 2/2 recent IV contrast given  Atrial fibrillation: Currently rate controlled. Chadsvasc score >2  - hold Eliquis    - Heparin drip per pharmacy   Diabetes mellitus type - Hypoglycemic protocol - Continue insulin pump per protocol - CBG checks every before meals and at bedtime  Essential hypertension  - continue furosemide, metoprolol, and ramipril  Hypothyroidism: Previous TSH 8.468 on 2/6. - Check TSH - Continue levothyroxine   Thrombocytopenia: Chronic. Platelet count unable to be obtained from her work. - Recheck CBC in a.m.  Morbid obesity  DVT  prophylaxis: heparin Code Status: DNR Family Communication: No family present at bedside  Disposition Plan: To be determined Consults called: Vascular surgery  Admission status:Inpatient  Norval Morton MD [facility] Pager [phone]  If 7PM-7AM, please contact night-coverage [email or web address] Password [facility]  04/27/2016, 10:31 PM

## 2016-04-28 ENCOUNTER — Ambulatory Visit: Payer: Medicare HMO | Admitting: Internal Medicine

## 2016-04-28 DIAGNOSIS — I96 Gangrene, not elsewhere classified: Secondary | ICD-10-CM

## 2016-04-28 LAB — GLUCOSE, CAPILLARY
GLUCOSE-CAPILLARY: 201 mg/dL — AB (ref 65–99)
GLUCOSE-CAPILLARY: 175 mg/dL — AB (ref 65–99)
GLUCOSE-CAPILLARY: 239 mg/dL — AB (ref 65–99)
Glucose-Capillary: 174 mg/dL — ABNORMAL HIGH (ref 65–99)
Glucose-Capillary: 200 mg/dL — ABNORMAL HIGH (ref 65–99)

## 2016-04-28 LAB — BASIC METABOLIC PANEL
ANION GAP: 11 (ref 5–15)
BUN: 25 mg/dL — ABNORMAL HIGH (ref 6–20)
CHLORIDE: 97 mmol/L — AB (ref 101–111)
CO2: 25 mmol/L (ref 22–32)
Calcium: 9.1 mg/dL (ref 8.9–10.3)
Creatinine, Ser: 1.11 mg/dL — ABNORMAL HIGH (ref 0.44–1.00)
GFR calc non Af Amer: 50 mL/min — ABNORMAL LOW (ref >60)
GFR, EST AFRICAN AMERICAN: 58 mL/min — AB (ref >60)
Glucose, Bld: 180 mg/dL — ABNORMAL HIGH (ref 65–99)
POTASSIUM: 4.2 mmol/L (ref 3.5–5.1)
Sodium: 133 mmol/L — ABNORMAL LOW (ref 135–145)

## 2016-04-28 LAB — CBC
HEMATOCRIT: 34.6 % — AB (ref 36.0–46.0)
HEMOGLOBIN: 11.3 g/dL — AB (ref 12.0–15.0)
MCH: 32.4 pg (ref 26.0–34.0)
MCHC: 32.7 g/dL (ref 30.0–36.0)
MCV: 99.1 fL (ref 78.0–100.0)
Platelets: 91 10*3/uL — ABNORMAL LOW (ref 150–400)
RBC: 3.49 MIL/uL — AB (ref 3.87–5.11)
RDW: 13.8 % (ref 11.5–15.5)
WBC: 5.1 10*3/uL (ref 4.0–10.5)

## 2016-04-28 LAB — APTT
APTT: 49 s — AB (ref 24–36)
APTT: 51 s — AB (ref 24–36)

## 2016-04-28 LAB — HEPARIN LEVEL (UNFRACTIONATED): Heparin Unfractionated: 1.6 IU/mL — ABNORMAL HIGH (ref 0.30–0.70)

## 2016-04-28 LAB — TSH: TSH: 25.297 u[IU]/mL — ABNORMAL HIGH (ref 0.350–4.500)

## 2016-04-28 MED ORDER — VANCOMYCIN HCL IN DEXTROSE 750-5 MG/150ML-% IV SOLN
750.0000 mg | Freq: Two times a day (BID) | INTRAVENOUS | Status: DC
  Administered 2016-04-28 – 2016-05-01 (×8): 750 mg via INTRAVENOUS
  Filled 2016-04-28 (×9): qty 150

## 2016-04-28 MED ORDER — INSULIN ASPART 100 UNIT/ML ~~LOC~~ SOLN
0.0000 [IU] | Freq: Three times a day (TID) | SUBCUTANEOUS | Status: DC
  Administered 2016-04-28: 2 [IU] via SUBCUTANEOUS
  Administered 2016-04-29: 1 [IU] via SUBCUTANEOUS

## 2016-04-28 MED ORDER — PIPERACILLIN-TAZOBACTAM 3.375 G IVPB
3.3750 g | Freq: Three times a day (TID) | INTRAVENOUS | Status: DC
  Administered 2016-04-28 – 2016-05-01 (×11): 3.375 g via INTRAVENOUS
  Filled 2016-04-28 (×14): qty 50

## 2016-04-28 MED ORDER — HEPARIN (PORCINE) IN NACL 100-0.45 UNIT/ML-% IJ SOLN
1450.0000 [IU]/h | INTRAMUSCULAR | Status: DC
  Administered 2016-04-28: 1100 [IU]/h via INTRAVENOUS
  Administered 2016-04-29 (×2): 1450 [IU]/h via INTRAVENOUS
  Filled 2016-04-28 (×2): qty 250

## 2016-04-28 MED ORDER — INSULIN PUMP
Freq: Three times a day (TID) | SUBCUTANEOUS | Status: DC
  Administered 2016-04-28 – 2016-04-30 (×9): via SUBCUTANEOUS
  Filled 2016-04-28: qty 1

## 2016-04-28 MED ORDER — HYDROMORPHONE HCL 1 MG/ML IJ SOLN
1.0000 mg | INTRAMUSCULAR | Status: DC | PRN
  Administered 2016-04-28 – 2016-05-02 (×9): 1 mg via INTRAVENOUS
  Filled 2016-04-28 (×10): qty 1

## 2016-04-28 NOTE — Telephone Encounter (Signed)
Per chart, pt has been admitted into the hospital.

## 2016-04-28 NOTE — Progress Notes (Signed)
Pharmacy Antibiotic Note  Alexis [name] is a 66 y.o. female admitted on 04/27/2016 with Wound infection.  Pharmacy has been consulted for Vancomycin and Zosyn dosing.  Plan:  Vancomycin 1566m iv x1, then Vancomycin 7549mIV every 12 hours.  Goal trough 15-20 mcg/mL. Zosyn 3.375g IV q8h (4 hour infusion).  Daily SCr     Temp (24hrs), Avg:97.9 F (36.6 C), Min:97.4 F (36.3 C), Max:98.2 F (36.8 C)   Recent Labs Lab 04/27/16 1943  WBC 6.7  CREATININE 0.80    Estimated Creatinine Clearance: 76.8 mL/min (by C-G formula based on SCr of 0.8 mg/dL).    Allergies  Allergen Reactions  . Indomethacin Other (See Comments)     Renal Insufficiency  . Pregabalin Other (See Comments)    Other reaction(s): DIZZINESS   . Sulfa Antibiotics Hives  . Sulfamethoxazole Hives    Antimicrobials this admission: Vancomycin 04/28/2016 >> Zosyn 04/28/2016 >>   Dose adjustments this admission: -  Microbiology results: pending  Thank you for allowing pharmacy to be a part of this patient's care.  GrNani Skillernrowford 04/28/2016 2:42 AM

## 2016-04-28 NOTE — Progress Notes (Signed)
Spoke with patient about her V-go 40 insulin pump.  States that she was diagnosed with diabetes about 20 years ago.  Has been using the V-Go pump for about 1 year.  Is seen at [facility]. Husband will be bringing new V-Go pumps to be changed every 24 hours.  Home Meds: V-Go 40 insulin pump, takes 6 pumps per meal  (This is equal to 1.67 units /hour and then 12 units insulin with meals when given 6 pumps=2 units/pump) CBGs looking good. Patient will be NPO for surgery on Friday. Recommend checking blood sugars every 4 hours while NPO.  V-Go can continue, but patient should not be giving bolus insulin during that time. When patient changes site, please document insertion site. Will continue to monitor blood sugars while in the hospital. Harvel Ricks RN BSN CDE

## 2016-04-28 NOTE — Progress Notes (Signed)
PROGRESS NOTE    Alexis [name]  NTZ:[id number] DOB: 14-Apr-1950 DOA: 04/27/2016 PCP: Mackie Pai, PA-C    Brief Narrative:  66 y.o. female with medical history significant of HTN, HLD, PVD, A.fib  on Eliquis, DM type II on insulin pump, CHF, COPD, s/p PM, and MI; who presents from wound care clinic for worsening right foot wound. Patient notes symptoms have progressively worsened over the past 1-1/2 months. Patient reports being seen by her primary care provider who initially sent her to podiatry. The podiatrist felt that the patient need to be seen by vascular surgeon, but patient no show for this appointment and requested referral to wound care. Patient previously had been using Voltran gel with some mild relief initially, hydrocortisone cream, and possibly nystatin cream. Associated symptoms that progressively worsened include pain with ambulation in the right foot, worsening peeling, and erythema. The patient was sent to wound care clinic and reports initially being recommended continue the same. However, yesterday patient was noted to have new necrotic appearing areas on the first and fifth toe for which she was referred to the hospital for further evaluation. Review of records shows that patient was noted to have claudication symptoms during her initial podiatry evaluation, which arterial studies were recommended.    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 This is clearly a limb threatening situation. I would recommend admission for intravenous antibiotics. Her Eliquis will need to be held. Her diabetes will need to be managed closely. Once her Eliquis has been held for 48 hours and we'll try to get her scheduled for an arteriogram  on Thursday and possible revascularization on Friday pending the results of her arteriogram. I will also order a vein map of the right great saphenous vein when she gets to [facility]. I have discussed the seriousness of her wound with the patient and she understands that this is a limb threatening situation with significant risk for limb loss.  - continue antibiotic regimen  Active Problems:   Diabetes ([place]) - Carb modified diet. - place on SSI    HTN (hypertension) - pt is currently on metoprolol and ramipril    Hypothyroidism - continue synthroid    Hyperlipidemia   Thrombocytopenia (HCC)   Persistent atrial fibrillation (HCC) - continue b blocker and heparin  DVT prophylaxis: on Heparin Code Status: DNR Family Communication: none at bedside Disposition Plan: pending recommendations by vascular surgeon   Consultants:   Vascular surgeon   Procedures: None  Antimicrobials: zosyn, vancomycin  Subjective: Pt has no new complaints no acute issues overnight.  Objective: Vitals:   04/28/16 0151 04/28/16 0255 04/28/16 0526 04/28/16 1006  BP: 150/69 (!) 164/90 (!) 101/51 (!) 126/52  Pulse: 82 67 76 (!) 57  Resp: 20 20 20 19   Temp: 97.9 F (36.6 C) 97.7 F (36.5 C) 98.4 F (36.9 C) 98.3 F (36.8 C)  TempSrc: Oral Oral Oral Oral  SpO2: 100% 99% 100% 100%  Weight:  105.8 kg (233 lb 4 oz)    Height:  5' 2"  (1.575 m)      Intake/Output Summary (Last 24 hours) at 04/28/16 1553 Last data filed at 04/28/16 0300  Gross per 24 hour  Intake  695 ml  Output                0 ml  Net              695 ml   Filed Weights   04/28/16 0255  Weight: 105.8 kg (233 lb 4 oz)    Examination:  General exam: Appears calm and comfortable, in nad Respiratory system: Clear to auscultation. Respiratory effort normal. Cardiovascular system: S1 & S2 heard, No JVD, murmurs, rubs,  Gastrointestinal system: Abdomen is nondistended, soft and nontender. No organomegaly or  masses felt. Normal bowel sounds heard. Central nervous system: Alert and oriented. No focal neurological deficits. Extremities: gangrene changes at RLE Skin: No rashes, lesions or ulcers, see above Psychiatry: Judgement and insight appear normal. Mood & affect appropriate.     Data Reviewed: I have personally reviewed following labs and imaging studies  CBC:  Recent Labs Lab 04/27/16 1943  WBC 6.7  NEUTROABS 4.7  HGB 12.8  HCT 37.0  MCV 94.9  PLT SPECIMEN CHECKED FOR CLOTS   Basic Metabolic Panel:  Recent Labs Lab 04/27/16 1943  NA 137  K 3.6  CL 102  CO2 26  GLUCOSE 176*  BUN 27*  CREATININE 0.80  CALCIUM 9.5   GFR: Estimated Creatinine Clearance: 78 mL/min (by C-G formula based on SCr of 0.8 mg/dL). Liver Function Tests: No results for input(s): AST, ALT, ALKPHOS, BILITOT, PROT, ALBUMIN in the last 168 hours. No results for input(s): LIPASE, AMYLASE in the last 168 hours. No results for input(s): AMMONIA in the last 168 hours. Coagulation Profile: No results for input(s): INR, PROTIME in the last 168 hours. Cardiac Enzymes: No results for input(s): CKTOTAL, CKMB, CKMBINDEX, TROPONINI in the last 168 hours. BNP (last 3 results)  Recent Labs  02/10/16 1219  PROBNP 60.0   HbA1C: No results for input(s): HGBA1C in the last 72 hours. CBG:  Recent Labs Lab 04/27/16 1814 04/28/16 0620 04/28/16 1127  GLUCAP 225* 174* 200*   Lipid Profile: No results for input(s): CHOL, HDL, LDLCALC, TRIG, CHOLHDL, LDLDIRECT in the last 72 hours. Thyroid Function Tests:  Recent Labs  04/28/16 0656  TSH 25.297*   Anemia Panel: No results for input(s): VITAMINB12, FOLATE, FERRITIN, TIBC, IRON, RETICCTPCT in the last 72 hours. Sepsis Labs: No results for input(s): PROCALCITON, LATICACIDVEN in the last 168 hours.  Recent Results (from the past 240 hour(s))  Blood Cultures x 2 sites     Status: None (Preliminary result)   Collection Time: 04/27/16  7:42 PM  Result  Value Ref Range Status   Specimen Description BLOOD LEFT ANTECUBITAL  Final   Special Requests BOTTLES DRAWN AEROBIC AND ANAEROBIC 5ML EA  Final   Culture   Final    NO GROWTH < 24 HOURS Performed at [facility], [address] [place], [place] [address]    Report Status PENDING  Incomplete  Blood Cultures x 2 sites     Status: None (Preliminary result)   Collection Time: 04/27/16  8:10 PM  Result Value Ref Range Status   Specimen Description BLOOD BLOOD LEFT HAND  Final   Special Requests BOTTLES DRAWN AEROBIC AND ANAEROBIC 5 CC EA  Final   Culture   Final    NO GROWTH < 24 HOURS Performed at [facility], [place] [address], [place], [place] [address]    Report Status PENDING  Incomplete         Radiology Studies: Ct Angio Low Extrem  Right W &/or Wo Contrast  Result Date: 04/27/2016 CLINICAL DATA:  Right lower extremity gangrene and pain. EXAM: CT ANGIOGRAPHY OF THE right lowerEXTREMITY TECHNIQUE: Multidetector CT imaging of the right lowerwas performed using the standard protocol during bolus administration of intravenous contrast. Multiplanar CT image reconstructions and MIPs were obtained to evaluate the vascular anatomy. CONTRAST:  100 mL of Isovue 370 intravenously. COMPARISON:  None. FINDINGS: Extensive atherosclerosis is seen throughout the visualized arterial system of the right lower extremity. Moderate to severe multifocal stenoses are noted throughout the visualized common femoral and superficial femoral artery as a result. There is complete occlusion of the distal right superficial femoral artery concerning for thrombus or acute embolus. Reconstitution of the right popliteal artery is noted, which also demonstrates multifocal stenoses secondary to atheromatous disease. Three vessel runoff is noted in the right calf to the ankle mortise. No osseous abnormality is noted. Review of the MIP images confirms the above findings. IMPRESSION: Extensive atheromatous disease  is seen throughout the arterial circulation of right lower extremity. Moderate to severe multifocal stenoses are noted throughout the visualized right common femoral and superficial femoral arteries and popliteal artery as a result. Acute thrombus or embolus is noted in the distal right superficial femoral artery, resulting in complete occlusion of the vessel at this level. Reconstitution of the right popliteal artery is noted, with 3 vessel runoff noted in the right calf to the ankle mortise. Critical Value/emergent results were called by telephone at the time of interpretation on 04/27/2016 at 10:02 pm to Dr. Margarita Mail , who verbally acknowledged these results. Electronically Signed   By: Marijo Conception, M.D.   On: 04/27/2016 22:03   Dg Foot 2 Views Right  Result Date: 04/27/2016 CLINICAL DATA:  Right foot infection.  Pain with foot wounds. EXAM: RIGHT FOOT - 2 VIEW COMPARISON:  Foot radiographs 07/12/2014 FINDINGS: There is no evidence of fracture or dislocation. No bony destructive change or abnormal density to suggest osteomyelitis. Plantar calcaneal spur. Mild osteoarthritis of the mid/hindfoot. Soft tissue edema suspected about the metatarsal phalangeal joints and digits. No evidence of soft tissue air or radiopaque foreign body. IMPRESSION: Soft tissue edema about the digits. No radiographic evidence of osteomyelitis. No radiopaque foreign body. Electronically Signed   By: Jeb Levering M.D.   On: 04/27/2016 19:30        Scheduled Meds: . allopurinol  100 mg Oral QHS  . DULoxetine  30 mg Oral QHS  . fluticasone  1 spray Each Nare QHS  . insulin pump   Subcutaneous TID AC, HS, 0200  . levothyroxine  175 mcg Oral QAC breakfast  . metoprolol succinate  25 mg Oral Daily  . piperacillin-tazobactam (ZOSYN)  IV  3.375 g Intravenous Q8H  . prenatal vitamin w/FE, FA  1 tablet Oral Daily  . ramipril  2.5 mg Oral Daily  . torsemide  10 mg Oral Daily  . vancomycin  750 mg Intravenous Q12H     Continuous Infusions: . sodium chloride 75 mL/hr at 04/28/16 0906  . heparin 1,100 Units/hr (04/28/16 0621)     LOS: 1 day    Time spent: > 35 minutes  Velvet Bathe, MD [facility] Pager [phone]  If 7PM-7AM, please contact night-coverage [email or web address] Password [facility] 04/28/2016, 3:53 PM

## 2016-04-28 NOTE — ED Notes (Signed)
Pt report called to 15m spoke with nurse. Called [facility] report transport given  Paper work complete for transfer

## 2016-04-28 NOTE — Progress Notes (Signed)
ANTICOAGULATION CONSULT NOTE - Initial Consult  Pharmacy Consult for heparin Indication: atrial fibrillation  Allergies  Allergen Reactions  . Indomethacin Other (See Comments)     Renal Insufficiency  . Pregabalin Other (See Comments)    Other reaction(s): DIZZINESS   . Sulfa Antibiotics Hives  . Sulfamethoxazole Hives    Patient Measurements: Height: 5' 2"  (157.5 cm) Weight: 233 lb 4 oz (105.8 kg) IBW/kg (Calculated) : 50.1 Heparin Dosing Weight: 75kg  Vital Signs: Temp: 98.3 F (36.8 C) (02/28 1006) Temp Source: Oral (02/28 1006) BP: 126/52 (02/28 1006) Pulse Rate: 57 (02/28 1006)  Labs:  Recent Labs  04/27/16 1943 04/28/16 1423  HGB 12.8  --   HCT 37.0  --   PLT SPECIMEN CHECKED FOR CLOTS  --   APTT  --  49*  HEPARINUNFRC  --  1.60*  CREATININE 0.80  --     Estimated Creatinine Clearance: 78 mL/min (by C-G formula based on SCr of 0.8 mg/dL).  Assessment: 66yo female presents w/ gangrenous toes, vascular surgeon evaluation reveals evidence of significant arterial occlusive dz, awaiting arteriogram and possible revascularization, to transition from Eliquis to heparin; last dose of Eliquis taken 2/27 at 0900 (this will affect anti-Xa levels).  APTT 49 subtherapeutic, anti-Xa level 1.6, falsely elevated as expected d/t eliquis.  Goal of Therapy:  Heparin level 0.3-0.7 units/ml aPTT 66-102 seconds Monitor platelets by anticoagulation protocol: Yes   Plan:  Increase heparin gtt to 1250 units/hr F/u 6 hr aPTT at [place], PharmD, BCPS  Clinical Pharmacist  Pager: [phone]   04/28/2016,4:05 PM

## 2016-04-28 NOTE — Care Management Note (Signed)
Case Management Note  Patient Details  Name: Alexis [name] MRN: [id number] Date of Birth: 1950-09-17  Subjective/Objective:            Patient was admitted with Gangrene of the right foot secondary to arterial occlusive disease. She was following with the [facility] as outpatient.  Lives at home with spouse. CM will follow for discharge needs pending patient's progress and physician orders.         Action/Plan:   Expected Discharge Date:                  Expected Discharge Plan:     In-House Referral:     Discharge planning Services     Post Acute Care Choice:    Choice offered to:     DME Arranged:    DME Agency:     HH Arranged:    HH Agency:     Status of Service:     If discussed at [employer] of Stay Meetings, dates discussed:    Additional Comments:  Rolm Baptise, RN 04/28/2016, 11:18 AM

## 2016-04-28 NOTE — Progress Notes (Signed)
ANTICOAGULATION CONSULT NOTE - Initial Consult  Pharmacy Consult for heparin Indication: atrial fibrillation  Allergies  Allergen Reactions  . Indomethacin Other (See Comments)     Renal Insufficiency  . Pregabalin Other (See Comments)    Other reaction(s): DIZZINESS   . Sulfa Antibiotics Hives  . Sulfamethoxazole Hives    Patient Measurements: Height: 5' 2" (157.5 cm) Weight: 233 lb 4 oz (105.8 kg) IBW/kg (Calculated) : 50.1 Heparin Dosing Weight: 75kg  Vital Signs: Temp: 98.4 F (36.9 C) (02/28 0526) Temp Source: Oral (02/28 0526) BP: 101/51 (02/28 0526) Pulse Rate: 76 (02/28 0526)  Labs:  Recent Labs  04/27/16 1943  HGB 12.8  HCT 37.0  PLT SPECIMEN CHECKED FOR CLOTS  CREATININE 0.80    Estimated Creatinine Clearance: 78 mL/min (by C-G formula based on SCr of 0.8 mg/dL).   Medical History: Past Medical History:  Diagnosis Date  . Allergy   . Anxiety   . Arthritis   . Asthma   . Atrial fibrillation ([place])   . CHF (congestive heart failure) ([place])   . COPD (chronic obstructive pulmonary disease) ([place])   . Diabetes mellitus without complication ([place])   . Hyperlipidemia   . Hypertension   . Myocardial infarction    several  . Pacemaker    CRT-P with RV lead and His Bundle lead ( high threshold)   . Peripheral neuropathy ([place])   . Thyroid disease     Medications:  Prescriptions Prior to Admission  Medication Sig Dispense Refill Last Dose  . albuterol (PROVENTIL HFA;VENTOLIN HFA) 108 (90 Base) MCG/ACT inhaler Inhale 2 puffs into the lungs every 6 (six) hours as needed for wheezing or shortness of breath. 1 Inhaler 2 Past Month at Unknown time  . albuterol (PROVENTIL) (2.5 MG/3ML) 0.083% nebulizer solution Take 3 mLs (2.5 mg total) by nebulization every 6 (six) hours as needed for wheezing or shortness of breath. 150 mL 1 Past Month at Unknown time  . allopurinol (ZYLOPRIM) 100 MG tablet Take ONE (1) tablet by mouth once daily (Patient taking  differently: Take 100 mg by mouth at bedtime. ) 90 tablet 3 04/27/2016 at Unknown time  . apixaban (ELIQUIS) 5 MG TABS tablet Take 1 tablet (5 mg total) by mouth 2 (two) times daily. 60 tablet 6 04/27/2016 at 0900  . DULoxetine (CYMBALTA) 30 MG capsule Take 1 capsule (30 mg total) by mouth 2 (two) times daily. (Patient taking differently: Take 30 mg by mouth at bedtime. ) 60 capsule 1 04/26/2016 at Unknown time  . fluticasone (FLONASE) 50 MCG/ACT nasal spray Place 1 spray into both nostrils daily. (Patient taking differently: Place 1 spray into both nostrils at bedtime. Place 1 spray into both nostrils daily.) 16 g 3 04/26/2016 at Unknown time  . fluticasone (FLOVENT HFA) 110 MCG/ACT inhaler Inhale 2 puffs into the lungs 2 (two) times daily. (Patient taking differently: Inhale 2 puffs into the lungs as needed (bronchitis). ) 1 Inhaler 3 Past Month at Unknown time  . Insulin Disposable Pump (V-GO 40) KIT Inject into the skin See admin instructions. Use with Novolog - 6 pumps before each meal   04/27/2016 at Unknown time  . levothyroxine (SYNTHROID, LEVOTHROID) 175 MCG tablet Take 175 mcg by mouth daily before breakfast.   04/26/2016 at Unknown time  . metoprolol succinate (TOPROL-XL) 25 MG 24 hr tablet Take 1 tablet by mouth daily.   04/26/2016 at 0800  . naproxen sodium (ALEVE) 220 MG tablet Take 440 mg by mouth every 12 (twelve) hours.  04/27/2016 at Unknown time  . Prenatal Vit-Fe Fumarate-FA (MULTIVITAMIN-PRENATAL) 27-0.8 MG TABS tablet Take 1 tablet by mouth daily. Takes for nail and hair growth    Past Week at Unknown time  . ramipril (ALTACE) 2.5 MG capsule TAKE 1 CAPSULE ONE TIME DAILY 90 capsule 1 04/26/2016 at Unknown time  . torsemide (DEMADEX) 10 MG tablet TAKE 1 TABLET EVERY DAY 90 tablet 1 04/27/2016 at Unknown time  . atorvastatin (LIPITOR) 10 MG tablet Take 1 tablet (10 mg total) by mouth daily. (Patient not taking: Reported on 04/05/2016) 30 tablet 0 Not Taking at Unknown time  . azelastine  (ASTELIN) 0.1 % nasal spray USE 2 SPRAYS IN EACH NOSTRIL TWICE DAILY AS DIRECTED (Patient not taking: Reported on 04/27/2016) 120 mL 3 Not Taking at Unknown time  . Blood Glucose Monitoring Suppl (GLUCOCOM BLOOD GLUCOSE MONITOR) DEVI Use to check blood sugars 6 times daily E11.65   04/05/2016 at Unknown time  . Insulin Pen Needle (B-D ULTRAFINE III SHORT PEN) 31G X 8 MM MISC Use for insulin pen up to five times daily E11.65   Taking  . spironolactone (ALDACTONE) 25 MG tablet Take ONE (1) tablet by mouth once daily (Patient not taking: Reported on 04/27/2016) 90 tablet 1 Not Taking at Unknown time   Scheduled:  . allopurinol  100 mg Oral QHS  . DULoxetine  30 mg Oral QHS  . fluticasone  1 spray Each Nare QHS  . insulin pump   Subcutaneous TID AC, HS, 0200  . iopamidol      . levothyroxine  175 mcg Oral QAC breakfast  . metoprolol succinate  25 mg Oral Daily  . piperacillin-tazobactam (ZOSYN)  IV  3.375 g Intravenous Q8H  . prenatal vitamin w/FE, FA  1 tablet Oral Daily  . ramipril  2.5 mg Oral Daily  . torsemide  10 mg Oral Daily  . vancomycin  750 mg Intravenous Q12H   Infusions:  . sodium chloride 75 mL/hr at 04/28/16 0024    Assessment: 66yo female presents w/ gangrenous toes, vascular surgeon evaluation reveals evidence of significant arterial occlusive dz, awaiting arteriogram and possible revascularization, to transition from Eliquis to heparin; last dose of Eliquis taken 2/27 at 0900 (this will affect anti-Xa levels).  Goal of Therapy:  Heparin level 0.3-0.7 units/ml aPTT 66-102 seconds Monitor platelets by anticoagulation protocol: Yes   Plan:  Will begin heparin gtt at 1100 units/hr and monitor heparin levels and CBC.  Wynona Neat, PharmD, BCPS  04/28/2016,5:49 AM

## 2016-04-28 NOTE — Progress Notes (Addendum)
VASCULAR SURGERY:  The patient is scheduled for arteriography tomorrow. We will make further recommendations pending these results. I tentatively have her scheduled for a right lower extremity bypass on Friday morning. Her Eliquis is on hold. Her renal function is normal.  I have reviewed with the patient the indications for arteriography. In addition, I have reviewed the potential complications of arteriography including but not limited to: Bleeding, arterial injury, arterial thrombosis, dye action, renal insufficiency, or other unpredictable medical problems. I have explained to the patient that if we find disease amenable to angioplasty we could potentially address this at the same time. I have discussed the potential complications of angioplasty and stenting, including but not limited to: Bleeding, arterial thrombosis, arterial injury, dissection, or the need for surgical intervention.  Alexis Mayo, MD, [place] [phone] Office: [phone]

## 2016-04-29 ENCOUNTER — Encounter (HOSPITAL_COMMUNITY): Payer: Self-pay | Admitting: Anesthesiology

## 2016-04-29 ENCOUNTER — Encounter (HOSPITAL_COMMUNITY): Payer: Self-pay | Admitting: Vascular Surgery

## 2016-04-29 ENCOUNTER — Encounter (HOSPITAL_COMMUNITY)
Admission: EM | Disposition: A | Discharge status: Home Health | Payer: Self-pay | Source: Home / Self Care | Attending: Family Medicine

## 2016-04-29 DIAGNOSIS — I998 Other disorder of circulatory system: Secondary | ICD-10-CM

## 2016-04-29 HISTORY — PX: PERIPHERAL VASCULAR INTERVENTION: CATH118257

## 2016-04-29 HISTORY — PX: ABDOMINAL AORTOGRAM W/LOWER EXTREMITY: CATH118223

## 2016-04-29 HISTORY — PX: PERIPHERAL VASCULAR ATHERECTOMY: CATH118256

## 2016-04-29 LAB — GLUCOSE, CAPILLARY
GLUCOSE-CAPILLARY: 137 mg/dL — AB (ref 65–99)
GLUCOSE-CAPILLARY: 124 mg/dL — AB (ref 65–99)
GLUCOSE-CAPILLARY: 119 mg/dL — AB (ref 65–99)
Glucose-Capillary: 84 mg/dL (ref 65–99)
Glucose-Capillary: 167 mg/dL — ABNORMAL HIGH (ref 65–99)

## 2016-04-29 LAB — CBC
HCT: 36.9 % (ref 36.0–46.0)
HEMOGLOBIN: 11.7 g/dL — AB (ref 12.0–15.0)
MCH: 31.7 pg (ref 26.0–34.0)
MCHC: 31.7 g/dL (ref 30.0–36.0)
MCV: 100 fL (ref 78.0–100.0)
Platelets: 93 10*3/uL — ABNORMAL LOW (ref 150–400)
RBC: 3.69 MIL/uL — ABNORMAL LOW (ref 3.87–5.11)
RDW: 13.7 % (ref 11.5–15.5)
WBC: 5.4 10*3/uL (ref 4.0–10.5)

## 2016-04-29 LAB — POCT ACTIVATED CLOTTING TIME
ACTIVATED CLOTTING TIME: 230 s
ACTIVATED CLOTTING TIME: 197 s
ACTIVATED CLOTTING TIME: 169 s

## 2016-04-29 LAB — PROTIME-INR
INR: 1.13
Prothrombin Time: 14.6 seconds (ref 11.4–15.2)

## 2016-04-29 LAB — BASIC METABOLIC PANEL
ANION GAP: 8 (ref 5–15)
BUN: 24 mg/dL — AB (ref 6–20)
CHLORIDE: 104 mmol/L (ref 101–111)
CO2: 26 mmol/L (ref 22–32)
Calcium: 9.2 mg/dL (ref 8.9–10.3)
Creatinine, Ser: 1.06 mg/dL — ABNORMAL HIGH (ref 0.44–1.00)
GFR, EST NON AFRICAN AMERICAN: 53 mL/min — AB (ref >60)
Glucose, Bld: 122 mg/dL — ABNORMAL HIGH (ref 65–99)
POTASSIUM: 4.3 mmol/L (ref 3.5–5.1)
SODIUM: 138 mmol/L (ref 135–145)

## 2016-04-29 SURGERY — ABDOMINAL AORTOGRAM W/LOWER EXTREMITY
Anesthesia: LOCAL | Laterality: Right

## 2016-04-29 MED ORDER — HEPARIN (PORCINE) IN NACL 2-0.9 UNIT/ML-% IJ SOLN
INTRAMUSCULAR | Status: AC
  Filled 2016-04-29: qty 500

## 2016-04-29 MED ORDER — FENTANYL CITRATE (PF) 100 MCG/2ML IJ SOLN
INTRAMUSCULAR | Status: AC
  Filled 2016-04-29: qty 2

## 2016-04-29 MED ORDER — ACETAMINOPHEN 325 MG PO TABS: 650.0000 mg | ORAL_TABLET | ORAL | Status: DC | PRN

## 2016-04-29 MED ORDER — SODIUM CHLORIDE 0.9 % IV SOLN: 1.0000 mL/kg/h | INTRAVENOUS | Status: AC

## 2016-04-29 MED ORDER — FENTANYL CITRATE (PF) 100 MCG/2ML IJ SOLN
INTRAMUSCULAR | Status: DC | PRN
  Administered 2016-04-29 (×2): 25 ug via INTRAVENOUS

## 2016-04-29 MED ORDER — OXYCODONE-ACETAMINOPHEN 5-325 MG PO TABS
1.0000 | ORAL_TABLET | ORAL | Status: DC | PRN
  Administered 2016-04-29 – 2016-05-02 (×4): 2 via ORAL
  Filled 2016-04-29 (×5): qty 2

## 2016-04-29 MED ORDER — HEPARIN (PORCINE) IN NACL 2-0.9 UNIT/ML-% IJ SOLN
INTRAMUSCULAR | Status: DC | PRN
  Administered 2016-04-29: 1000 mL

## 2016-04-29 MED ORDER — HEPARIN (PORCINE) IN NACL 2-0.9 UNIT/ML-% IJ SOLN
INTRAMUSCULAR | Status: AC
Start: 2016-04-29 — End: 2016-04-29
  Filled 2016-04-29: qty 500

## 2016-04-29 MED ORDER — CLOPIDOGREL BISULFATE 75 MG PO TABS
75.0000 mg | ORAL_TABLET | Freq: Every day | ORAL | Status: DC
  Administered 2016-04-30: 75 mg via ORAL
  Filled 2016-04-29 (×2): qty 1

## 2016-04-29 MED ORDER — CLOPIDOGREL BISULFATE 300 MG PO TABS
300.0000 mg | ORAL_TABLET | Freq: Once | ORAL | Status: AC
  Administered 2016-04-29: 300 mg via ORAL
  Filled 2016-04-29 (×2): qty 1

## 2016-04-29 MED ORDER — IODIXANOL 320 MG/ML IV SOLN
INTRAVENOUS | Status: DC | PRN
  Administered 2016-04-29: 110 mL via INTRAVENOUS

## 2016-04-29 MED ORDER — HEPARIN SODIUM (PORCINE) 1000 UNIT/ML IJ SOLN
INTRAMUSCULAR | Status: DC | PRN
Start: 2016-04-29 — End: 2016-04-29
  Administered 2016-04-29: 5000 [IU] via INTRAVENOUS

## 2016-04-29 MED ORDER — MIDAZOLAM HCL 2 MG/2ML IJ SOLN
INTRAMUSCULAR | Status: AC
  Filled 2016-04-29: qty 2

## 2016-04-29 MED ORDER — MIDAZOLAM HCL 2 MG/2ML IJ SOLN
INTRAMUSCULAR | Status: DC | PRN
  Administered 2016-04-29: 1 mg via INTRAVENOUS

## 2016-04-29 MED ORDER — HEPARIN (PORCINE) IN NACL 100-0.45 UNIT/ML-% IJ SOLN
1450.0000 [IU]/h | INTRAMUSCULAR | Status: DC
  Administered 2016-04-29 – 2016-05-01 (×3): 1450 [IU]/h via INTRAVENOUS
  Filled 2016-04-29 (×2): qty 250

## 2016-04-29 MED ORDER — MORPHINE SULFATE (PF) 4 MG/ML IV SOLN
2.0000 mg | INTRAVENOUS | Status: DC | PRN
  Filled 2016-04-29: qty 1

## 2016-04-29 MED ORDER — LIDOCAINE HCL (PF) 1 % IJ SOLN
INTRAMUSCULAR | Status: DC | PRN
  Administered 2016-04-29: 15 mL

## 2016-04-29 MED ORDER — ONDANSETRON HCL 4 MG/2ML IJ SOLN
4.0000 mg | Freq: Four times a day (QID) | INTRAMUSCULAR | Status: DC | PRN
  Administered 2016-04-29 – 2016-05-01 (×5): 4 mg via INTRAVENOUS
  Filled 2016-04-29 (×5): qty 2

## 2016-04-29 MED ORDER — HEPARIN SODIUM (PORCINE) 1000 UNIT/ML IJ SOLN
INTRAMUSCULAR | Status: AC
  Filled 2016-04-29: qty 1

## 2016-04-29 MED ORDER — LIDOCAINE HCL (PF) 1 % IJ SOLN
INTRAMUSCULAR | Status: AC
Start: 2016-04-29 — End: 2016-04-29
  Filled 2016-04-29: qty 30

## 2016-04-29 SURGICAL SUPPLY — 27 items
BALLN COYOTE OTW 4X220X150 (BALLOONS) ×3
BALLN IN.PACT DCB 5X150 (BALLOONS) ×3
BALLOON COYOTE OTW 4X220X150 (BALLOONS) ×2 IMPLANT
BUR JETSTREAM XC 2.1/3.0 (BURR) ×2 IMPLANT
BURR JETSTREAM XC 2.1/3.0 (BURR) ×3
CATH OMNI FLUSH 5F 65CM (CATHETERS) ×2 IMPLANT
CATH QUICKCROSS .035X135CM (MICROCATHETER) ×1 IMPLANT
CATH SOFT-VU 4F 65 STRAIGHT (CATHETERS) ×1 IMPLANT
CATH SOFT-VU STRAIGHT 4F 65CM (CATHETERS) ×3
DCB IN.PACT 5X150 (BALLOONS) ×2 IMPLANT
DEVICE CONTINUOUS FLUSH (MISCELLANEOUS) ×1 IMPLANT
DEVICE EMBOSHIELD NAV6 2.5-4.8 (WIRE) ×2 IMPLANT
DEVICE TORQUE .025-.038 (MISCELLANEOUS) ×2 IMPLANT
GUIDEWIRE ANGLED .035X260CM (WIRE) ×3 IMPLANT
KIT ENCORE 26 ADVANTAGE (KITS) ×1 IMPLANT
KIT PV (KITS) ×3 IMPLANT
LUBRICANT VIPERSLIDE CORONARY (MISCELLANEOUS) ×3 IMPLANT
SHEATH FLEXOR ANSEL 1 7F 45CM (SHEATH) ×3 IMPLANT
SHEATH PINNACLE 5F 10CM (SHEATH) ×2 IMPLANT
SYR CONTROL 10ML ANGIOGRAPHIC (SYRINGE) ×2 IMPLANT
SYR MEDRAD MARK V 150ML (SYRINGE) ×3 IMPLANT
TRANSDUCER W/STOPCOCK (MISCELLANEOUS) ×3 IMPLANT
TRAY PV CATH (CUSTOM PROCEDURE TRAY) ×3 IMPLANT
WIRE BAREWIRE WORK .014X315CM (WIRE) ×1 IMPLANT
WIRE BENTSON .035X145CM (WIRE) ×3 IMPLANT
WIRE MINI STICK MAX (SHEATH) ×2 IMPLANT
WIRE ROSEN-J .035X260CM (WIRE) ×2 IMPLANT

## 2016-04-29 NOTE — Progress Notes (Signed)
Patient arrived the unit from cath lab , assessment completed see flowsheet, placed on tele ccmd notified, patient oriented to room and staff, bed in lowest position, call light within reach will continue to monitor

## 2016-04-29 NOTE — Op Note (Signed)
    Patient name: Alexis [name] MRN: [id number] DOB: 10-27-1950 Sex: female  04/29/2016 Pre-operative Diagnosis: critical right lower extremity ischemia Post-operative diagnosis:  Same Surgeon:  Erlene Quan C. Donzetta Matters, MD Procedure Performed: 1.  US guided cannulation of left common femoral artery 2.  Aortogram with bilateral lower extremity runoff 3.  Jetstream atherectomy of right SFA and popliteal arteries 4.  Drug coated balloon angioplasty of Right sfa and popliteal arteries with 16m impact 5.  Moderate sedation with fentanyl and versed for 93 minutes   Indications:  66year old female atrial fibrillation on anticoagulation. She has to a history of right lower extremity pain as well as rapidly necrosing toes and is indicated for the above procedure.  Findings: Aorta and iliac segments are patent without significant disease. On the left there is an area of SFA that has 50% stenosis. Dominant vessel appears to be peroneal artery on the left. On the right nose an area of greater than 70% stenosis in the proximal SFA. There are multiple areas of stenoses that are not hemodynamically significant throughout the SFA. The distal SFA and popliteal artery are occluded and reconstituted after approximately 2 cm dominant runoff to the foot is the peroneal and the PT artery also to the ankle. Following intervention there is 0% residual stenosis in the SFA or popliteal arteries on the right and runoff is via the peroneal artery and fills the foot via a medial calcaneal branch.   Procedure:  The patient was identified in the holding area and taken to room 8.  The patient was then placed supine on the table and prepped and draped in the usual sterile fashion.  A time out was called.  Ultrasound was used to evaluate the left common femoral artery.  It was patent .  A digital ultrasound image was acquired.  A micropuncture needle was used to access the left common femoral artery under ultrasound guidance.  An 018  wire was advanced without resistance and a micropuncture sheath was placed.  The 018 wire was removed and a benson wire was placed.  The micropuncture sheath was exchanged for a 5 french sheath.  An omniflush catheter was advanced over the wire to the level of L-1.  An abdominal angiogram was obtained followed by bilateral lower extremity runoff. With the above findings we then went up and over the bifurcation which was tight use quick cross catheter and Glidewire to select the SFA and exchanged for RBarnes [employer] Noblewire. We then placed a long 7 French sheath and the patient was heparinized and ACT checked was 2:30. Using Glidewire quick cross catheter we crossed the occlusion confirmed intraluminal access. We then exchanged for 014 wire and placed an embolic protection device at the below-knee popliteal artery. Following this jetstream atherectomy of the SFA and popliteal arteries were performed followed by predilatation with a 4 mm balloon. We then performed drug-coated balloon angioplasty of the proximal SFA and distal SFA popliteal arteries with 5 mm balloon. Following this we performed angiography with the above findings and 0% residual stenosis and runoff to the foot via the peroneal artery on the right. Satisfied we retrieved our filter removed our catheter confirmed our filter was intact. Our sheath was retracted to the left external iliac artery and the wire removed. Sheath to be pulled in postoperative holding. Patient tolerated procedure without immediate complication.  Contrast: 110cc  Ruey Storer C. CDonzetta Matters MD [facility] [place] FalconOffice: [phone]: [phone]

## 2016-04-29 NOTE — Progress Notes (Addendum)
PROGRESS NOTE    Alexis [name]  PNT:[id number] DOB: 03/15/1950 DOA: 04/27/2016 PCP: Mackie Pai, PA-C    Brief Narrative:  66 y.o. female with medical history significant of HTN, HLD, PVD, A.fib  on Eliquis, DM type II on insulin pump, CHF, COPD, s/p PM, and MI; who presents from wound care clinic for worsening right foot wound. Patient notes symptoms have progressively worsened over the past 1-1/2 months. Patient reports being seen by her primary care provider who initially sent her to podiatry. The podiatrist felt that the patient need to be seen by vascular surgeon, but patient no show for this appointment and requested referral to wound care. Patient previously had been using Voltran gel with some mild relief initially, hydrocortisone cream, and possibly nystatin cream. Associated symptoms that progressively worsened include pain with ambulation in the right foot, worsening peeling, and erythema. The patient was sent to wound care clinic and reports initially being recommended continue the same. However, yesterday patient was noted to have new necrotic appearing areas on the first and fifth toe for which she was referred to the hospital for further evaluation. Review of records shows that patient was noted to have claudication symptoms during her initial podiatry evaluation, which arterial studies were recommended.    Assessment & Plan:   Principal Problem:   Gangrene of foot [facility]) - Vascular surgeon managing. Patient is s/p arthrectomy and PTA of right SFA and popliteal arteries. - Zosyn and Vancomycin on board.   Active Problems:   Diabetes ([place]) - Carb modified diet. - place on SSI    HTN (hypertension) - pt is currently on metoprolol and ramipril    Hypothyroidism - continue synthroid    Hyperlipidemia   Thrombocytopenia (HCC)    Persistent atrial fibrillation (HCC) - continue b blocker and heparin  DVT prophylaxis: on Heparin Code Status: DNR Family Communication:  none at bedside Disposition Plan: pending recommendations by vascular surgeon   Consultants:   Vascular surgeon   Procedures: None  Antimicrobials: zosyn, vancomycin  Subjective: Pt has no new complaints no acute issues overnight.  Objective: Vitals:   04/29/16 1415 04/29/16 1430 04/29/16 1445 04/29/16 1528  BP: (!) 123/59 (!) 106/14 113/69 (!) 97/42  Pulse: 75 67 (!) 35 72  Resp: 17 17 12 16   Temp:    97.8 F (36.6 C)  TempSrc:    Oral  SpO2: 98% 94% 95% 98%  Weight:      Height:        Intake/Output Summary (Last 24 hours) at 04/29/16 1752 Last data filed at 04/29/16 0800  Gross per 24 hour  Intake                0 ml  Output                0 ml  Net                0 ml   Filed Weights   04/28/16 0255  Weight: 105.8 kg (233 lb 4 oz)    Examination:  General exam: Appears calm and comfortable, in nad Respiratory system: Clear to auscultation. Respiratory effort normal. Cardiovascular system: S1 & S2 heard, No JVD, murmurs, rubs,  Gastrointestinal system: Abdomen is nondistended, soft and nontender. No organomegaly or masses felt. Normal bowel sounds heard. Central nervous system: Alert and oriented. No focal neurological deficits. Extremities: gangrene changes at RLE Skin: No rashes, lesions or ulcers, see above Psychiatry: Judgement and insight appear normal. Mood &  affect appropriate.   Data Reviewed: I have personally reviewed following labs and imaging studies  CBC:  Recent Labs Lab 04/27/16 1943 04/28/16 2242 04/29/16 0310  WBC 6.7 5.1 5.4  NEUTROABS 4.7  --   --   HGB 12.8 11.3* 11.7*  HCT 37.0 34.6* 36.9  MCV 94.9 99.1 100.0  PLT SPECIMEN CHECKED FOR CLOTS 91* 93*   Basic Metabolic Panel:  Recent Labs Lab 04/27/16 1943 04/28/16 2242 04/29/16 0310  NA 137 133* 138  K 3.6 4.2 4.3  CL 102 97* 104  CO2 26 25 26   GLUCOSE 176* 180* 122*  BUN 27* 25* 24*  CREATININE 0.80 1.11* 1.06*  CALCIUM 9.5 9.1 9.2   GFR: Estimated Creatinine  Clearance: 58.9 mL/min (by C-G formula based on SCr of 1.06 mg/dL (H)). Liver Function Tests: No results for input(s): AST, ALT, ALKPHOS, BILITOT, PROT, ALBUMIN in the last 168 hours. No results for input(s): LIPASE, AMYLASE in the last 168 hours. No results for input(s): AMMONIA in the last 168 hours. Coagulation Profile:  Recent Labs Lab 04/29/16 0310  INR 1.13   Cardiac Enzymes: No results for input(s): CKTOTAL, CKMB, CKMBINDEX, TROPONINI in the last 168 hours. BNP (last 3 results)  Recent Labs  02/10/16 1219  PROBNP 60.0   HbA1C: No results for input(s): HGBA1C in the last 72 hours. CBG:  Recent Labs Lab 04/28/16 2224 04/29/16 0148 04/29/16 0635 04/29/16 0947 04/29/16 1619  GLUCAP 175* 137* 124* 119* 84   Lipid Profile: No results for input(s): CHOL, HDL, LDLCALC, TRIG, CHOLHDL, LDLDIRECT in the last 72 hours. Thyroid Function Tests:  Recent Labs  04/28/16 0656  TSH 25.297*   Anemia Panel: No results for input(s): VITAMINB12, FOLATE, FERRITIN, TIBC, IRON, RETICCTPCT in the last 72 hours. Sepsis Labs: No results for input(s): PROCALCITON, LATICACIDVEN in the last 168 hours.  Recent Results (from the past 240 hour(s))  Blood Cultures x 2 sites     Status: None (Preliminary result)   Collection Time: 04/27/16  7:42 PM  Result Value Ref Range Status   Specimen Description BLOOD LEFT ANTECUBITAL  Final   Special Requests BOTTLES DRAWN AEROBIC AND ANAEROBIC 5ML EA  Final   Culture   Final    NO GROWTH 2 DAYS Performed at [facility], [address], [place], [place] [address]    Report Status PENDING  Incomplete  Blood Cultures x 2 sites     Status: None (Preliminary result)   Collection Time: 04/27/16  8:10 PM  Result Value Ref Range Status   Specimen Description BLOOD BLOOD LEFT HAND  Final   Special Requests BOTTLES DRAWN AEROBIC AND ANAEROBIC 5 CC EA  Final   Culture   Final    NO GROWTH 2 DAYS Performed at [facility], [place]  [address] [place], [place] [address]    Report Status PENDING  Incomplete         Radiology Studies: Ct Angio Low Extrem Right W &/or Wo Contrast  Result Date: 04/27/2016 CLINICAL DATA:  Right lower extremity gangrene and pain. EXAM: CT ANGIOGRAPHY OF THE right lowerEXTREMITY TECHNIQUE: Multidetector CT imaging of the right lowerwas performed using the standard protocol during bolus administration of intravenous contrast. Multiplanar CT image reconstructions and MIPs were obtained to evaluate the vascular anatomy. CONTRAST:  100 mL of Isovue 370 intravenously. COMPARISON:  None. FINDINGS: Extensive atherosclerosis is seen throughout the visualized arterial system of the right lower extremity. Moderate to severe multifocal stenoses are noted throughout  the visualized common femoral and superficial femoral artery as a result. There is complete occlusion of the distal right superficial femoral artery concerning for thrombus or acute embolus. Reconstitution of the right popliteal artery is noted, which also demonstrates multifocal stenoses secondary to atheromatous disease. Three vessel runoff is noted in the right calf to the ankle mortise. No osseous abnormality is noted. Review of the MIP images confirms the above findings. IMPRESSION: Extensive atheromatous disease is seen throughout the arterial circulation of right lower extremity. Moderate to severe multifocal stenoses are noted throughout the visualized right common femoral and superficial femoral arteries and popliteal artery as a result. Acute thrombus or embolus is noted in the distal right superficial femoral artery, resulting in complete occlusion of the vessel at this level. Reconstitution of the right popliteal artery is noted, with 3 vessel runoff noted in the right calf to the ankle mortise. Critical Value/emergent results were called by telephone at the time of interpretation on 04/27/2016 at 10:02 pm to Dr. Margarita Mail , who verbally  acknowledged these results. Electronically Signed   By: Marijo Conception, M.D.   On: 04/27/2016 22:03   Dg Foot 2 Views Right  Result Date: 04/27/2016 CLINICAL DATA:  Right foot infection.  Pain with foot wounds. EXAM: RIGHT FOOT - 2 VIEW COMPARISON:  Foot radiographs 07/12/2014 FINDINGS: There is no evidence of fracture or dislocation. No bony destructive change or abnormal density to suggest osteomyelitis. Plantar calcaneal spur. Mild osteoarthritis of the mid/hindfoot. Soft tissue edema suspected about the metatarsal phalangeal joints and digits. No evidence of soft tissue air or radiopaque foreign body. IMPRESSION: Soft tissue edema about the digits. No radiographic evidence of osteomyelitis. No radiopaque foreign body. Electronically Signed   By: Jeb Levering M.D.   On: 04/27/2016 19:30    Scheduled Meds: . allopurinol  100 mg Oral QHS  . clopidogrel  300 mg Oral Once  . [START ON 04/30/2016] clopidogrel  75 mg Oral Q breakfast  . DULoxetine  30 mg Oral QHS  . fluticasone  1 spray Each Nare QHS  . insulin aspart  0-9 Units Subcutaneous TID WC  . insulin pump   Subcutaneous TID AC, HS, 0200  . levothyroxine  175 mcg Oral QAC breakfast  . metoprolol succinate  25 mg Oral Daily  . piperacillin-tazobactam (ZOSYN)  IV  3.375 g Intravenous Q8H  . prenatal vitamin w/FE, FA  1 tablet Oral Daily  . ramipril  2.5 mg Oral Daily  . torsemide  10 mg Oral Daily  . vancomycin  750 mg Intravenous Q12H   Continuous Infusions: . sodium chloride 75 mL/hr at 04/29/16 0810  . heparin       LOS: 2 days    Time spent: > 35 minutes  Velvet Bathe, MD [facility] Pager [phone]  If 7PM-7AM, please contact night-coverage [email or web address] Password [email or web address] 04/29/2016, 5:52 PM

## 2016-04-29 NOTE — Progress Notes (Signed)
ANTICOAGULATION CONSULT NOTE - Follow Up Consult  Pharmacy Consult for Heparin (Apixaban on hold) Indication: atrial fibrillation  Allergies  Allergen Reactions  . Indomethacin Other (See Comments)     Renal Insufficiency  . Pregabalin Other (See Comments)    DIZZINESS   . Sulfa Antibiotics Hives    Patient Measurements: Height: 5' 2"  (157.5 cm) Weight: 233 lb 4 oz (105.8 kg) IBW/kg (Calculated) : 50.1  Vital Signs: Temp: 97.7 F (36.5 C) (03/01 0912) Temp Source: Oral (03/01 0912) BP: 113/69 (03/01 1445) Pulse Rate: 35 (03/01 1445)  Labs:  Recent Labs  04/27/16 1943 04/28/16 1423 04/28/16 2242 04/29/16 0310  HGB 12.8  --  11.3* 11.7*  HCT 37.0  --  34.6* 36.9  PLT SPECIMEN CHECKED FOR CLOTS  --  91* 93*  APTT  --  49* 51*  --   LABPROT  --   --   --  14.6  INR  --   --   --  1.13  HEPARINUNFRC  --  1.60*  --   --   CREATININE 0.80  --  1.11* 1.06*    Estimated Creatinine Clearance: 58.9 mL/min (by C-G formula based on SCr of 1.06 mg/dL (H)).   Assessment: Heparin while apixaban on hold in anticipation of surgery.  Patient s/p arthrectomy and PTA of right SFA and popliteal arteries this morning. Patient being started on plavix, heparin to restart tonight. Apixaban planned to restart tomorrow.   Goal of Therapy:  Heparin level 0.3-0.7 units/ml aPTT 66-102 seconds Monitor platelets by anticoagulation protocol: Yes   Plan:  -Restart heparin at 1450 units/hr tonight 6h post sheath pull - 2100 -0500 HL/aPTT  Erin Hearing PharmD., BCPS Clinical Pharmacist Pager [phone] 04/29/2016 3:24 PM

## 2016-04-29 NOTE — Progress Notes (Signed)
  Progress Note    04/29/2016 10:37 AM * No surgery date entered *  Subjective:  Right foot pain  Vitals:   04/29/16 0629 04/29/16 0912  BP: 116/69 116/76  Pulse: 75 84  Resp: 18 18  Temp: 97.8 F (36.6 C) 97.7 F (36.5 C)    Physical Exam: aaox3 Bilateral femoral pulses are present No popliteal pulses  CBC    Component Value Date/Time   WBC 5.4 04/29/2016 0310   RBC 3.69 (L) 04/29/2016 0310   HGB 11.7 (L) 04/29/2016 0310   HCT 36.9 04/29/2016 0310   PLT 93 (L) 04/29/2016 0310   MCV 100.0 04/29/2016 0310   MCH 31.7 04/29/2016 0310   MCHC 31.7 04/29/2016 0310   RDW 13.7 04/29/2016 0310   LYMPHSABS 1.4 04/27/2016 1943   MONOABS 0.5 04/27/2016 1943   EOSABS 0.1 04/27/2016 1943   BASOSABS 0.0 04/27/2016 1943    BMET    Component Value Date/Time   NA 138 04/29/2016 0310   K 4.3 04/29/2016 0310   CL 104 04/29/2016 0310   CO2 26 04/29/2016 0310   GLUCOSE 122 (H) 04/29/2016 0310   BUN 24 (H) 04/29/2016 0310   CREATININE 1.06 (H) 04/29/2016 0310   CALCIUM 9.2 04/29/2016 0310   GFRNONAA 53 (L) 04/29/2016 0310   GFRAA >60 04/29/2016 0310    INR    Component Value Date/Time   INR 1.13 04/29/2016 0310     Intake/Output Summary (Last 24 hours) at 04/29/16 1037 Last data filed at 04/29/16 0800  Gross per 24 hour  Intake                0 ml  Output                0 ml  Net                0 ml     Assessment:  66 y.o. female is here with necrosis of her right toes that has been progressive for 2 weeks.   Plan: Angiogram with bilateral runoff and possible intervention on right Discussed risks and benefits again and she and husband understand.    Jamaurion Slemmer C. Donzetta Matters, MD [facility] Office: [phone] Pager: [phone]  04/29/2016 10:37 AM

## 2016-04-29 NOTE — Progress Notes (Addendum)
ANTICOAGULATION CONSULT NOTE - Follow Up Consult  Pharmacy Consult for Heparin (Apixaban on hold) Indication: atrial fibrillation, likely RLE bypass 3/2 AM  Allergies  Allergen Reactions  . Indomethacin Other (See Comments)     Renal Insufficiency  . Pregabalin Other (See Comments)    Other reaction(s): DIZZINESS   . Sulfa Antibiotics Hives  . Sulfamethoxazole Hives    Patient Measurements: Height: 5' 2"  (157.5 cm) Weight: 233 lb 4 oz (105.8 kg) IBW/kg (Calculated) : 50.1  Vital Signs: Temp: 98.6 F (37 C) (02/28 2133) Temp Source: Oral (02/28 2133) BP: 142/81 (02/28 2133) Pulse Rate: 56 (02/28 2133)  Labs:  Recent Labs  04/27/16 1943 04/28/16 1423 04/28/16 2242  HGB 12.8  --  11.3*  HCT 37.0  --  34.6*  PLT SPECIMEN CHECKED FOR CLOTS  --  91*  APTT  --  49* 51*  HEPARINUNFRC  --  1.60*  --   CREATININE 0.80  --  1.11*    Estimated Creatinine Clearance: 56.2 mL/min (by C-G formula based on SCr of 1.11 mg/dL (H)).   Assessment: Heparin while apixaban on hold in anticipation of surgery on Friday morning, baseline anti-Xa level elevated due to apixaban use, will be using aPTT to guide dosing for next 24-48 hours, aPTT is sub-therapeutic at 51, no issues noted.   Goal of Therapy:  Heparin level 0.3-0.7 units/ml aPTT 66-102 seconds Monitor platelets by anticoagulation protocol: Yes   Plan:  -Inc heparin to 1450 units/hr -0900 HL/aPTT  Narda Bonds 04/29/2016,12:20 AM

## 2016-04-29 NOTE — Progress Notes (Addendum)
JAILEY, BOOTON ([id number]) Visit Report for 04/27/2016 Arrival Information Details Patient Name: Alexis [name], Alexis [name]. Date of Service: 04/27/2016 3:30 PM Medical Record Patient Account Number: [id number] Number: Treating RN: Ahmed Prima 08/03/1950 (66 y.o. Other Clinician: Date of Birth/Sex: Female) Treating ROBSON, MICHAEL Primary Care Tilley Faeth: Mackie Pai Roshawna Colclasure/Extender: G Referring Venia Riveron: Mal Misty in Treatment: 1 Visit Information History Since Last Visit All ordered tests and consults were completed: No Patient Arrived: Wheel Chair Added or deleted any medications: No Arrival Time: 15:06 Any new allergies or adverse reactions: No Accompanied By: self Had a fall or experienced change in No Transfer Assistance: None activities of daily living that may affect Patient Identification Verified: Yes risk of falls: Secondary Verification Process Yes Signs or symptoms of abuse/neglect since last No Completed: visito Patient Requires Transmission- No Hospitalized since last visit: No Based Precautions: Has Dressing in Place as Prescribed: Yes Patient Has Alerts: Yes Pain Present Now: Yes Patient Alerts: Patient on Blood Thinner DM II Eliquis Electronic Signature(s) Signed: 04/27/2016 4:34:40 PM By: Alric Quan Entered By: Alric Quan on 04/27/2016 15:08:08 [name] ([id number]) -------------------------------------------------------------------------------- Clinic Level of Care Assessment Details Patient Name: [name] Date of Service: 04/27/2016 3:30 PM Medical Record Patient Account Number: [id number] Number: Treating RN: Ahmed Prima [birth year] (66 y.o. Other Clinician: Date of Birth/Sex: Female) Treating ROBSON, [place] Primary Care Mikalyn Hermida: Mackie Pai Leodan Bolyard/Extender: G Referring Levie Wages: Mal Misty in Treatment: 1 Clinic Level of Care Assessment Items TOOL 4  Quantity Score X - Use when only an EandM is performed on FOLLOW-UP visit 1 0 ASSESSMENTS - Nursing Assessment / Reassessment X - Reassessment of Co-morbidities (includes updates in patient status) 1 10 X - Reassessment of Adherence to Treatment Plan 1 5 ASSESSMENTS - Wound and Skin Assessment / Reassessment X - Simple Wound Assessment / Reassessment - one wound 1 5 []  - Complex Wound Assessment / Reassessment - multiple wounds 0 []  - Dermatologic / Skin Assessment (not related to wound area) 0 ASSESSMENTS - Focused Assessment []  - Circumferential Edema Measurements - multi extremities 0 []  - Nutritional Assessment / Counseling / Intervention 0 []  - Lower Extremity Assessment (monofilament, tuning fork, pulses) 0 []  - Peripheral Arterial Disease Assessment (using hand held doppler) 0 ASSESSMENTS - Ostomy and/or Continence Assessment and Care []  - Incontinence Assessment and Management 0 []  - Ostomy Care Assessment and Management (repouching, etc.) 0 PROCESS - Coordination of Care []  - Simple Patient / Family Education for ongoing care 0 X - Complex (extensive) Patient / Family Education for ongoing care 1 20 X - Staff obtains Programmer, systems, Records, Test Results / Process Orders 1 10 []  - Staff telephones HHA, Nursing Homes / Clarify orders / etc 0 SAVANNA, DOOLEY ([id number]) []  - Routine Transfer to another Facility (non-emergent condition) 0 X - Routine Hospital Admission (non-emergent condition) 1 10 []  - New Admissions / Biomedical engineer / Ordering NPWT, Apligraf, etc. 0 []  - Emergency Hospital Admission (emergent condition) 0 []  - Simple Discharge Coordination 0 X - Complex (extensive) Discharge Coordination 1 15 PROCESS - Special Needs []  - Pediatric / Minor Patient Management 0 []  - Isolation Patient Management 0 []  - Hearing / Language / Visual special needs 0 []  - Assessment of Community assistance (transportation, D/C planning, etc.) 0 []  - Additional assistance /  Altered mentation 0 []  - Support Surface(s) Assessment (bed, cushion, seat, etc.) 0 INTERVENTIONS - Wound Cleansing / Measurement []  - Simple Wound Cleansing - one wound 0 X -  Complex Wound Cleansing - multiple wounds 1 5 X - Wound Imaging (photographs - any number of wounds) 1 5 []  - Wound Tracing (instead of photographs) 0 []  - Simple Wound Measurement - one wound 0 X - Complex Wound Measurement - multiple wounds 1 5 INTERVENTIONS - Wound Dressings []  - Small Wound Dressing one or multiple wounds 0 X - Medium Wound Dressing one or multiple wounds 1 15 []  - Large Wound Dressing one or multiple wounds 0 X - Application of Medications - topical 1 5 []  - Application of Medications - injection 0 PROVIDENCIA, [place]. ([id number]) INTERVENTIONS - Miscellaneous []  - External ear exam 0 []  - Specimen Collection (cultures, biopsies, blood, body fluids, etc.) 0 []  - Specimen(s) / Culture(s) sent or taken to Lab for analysis 0 []  - Patient Transfer (multiple staff / Harrel Lemon Lift / Similar devices) 0 []  - Simple Staple / Suture removal (25 or less) 0 []  - Complex Staple / Suture removal (26 or more) 0 []  - Hypo / Hyperglycemic Management (close monitor of Blood Glucose) 0 []  - Ankle / Brachial Index (ABI) - do not check if billed separately 0 X - Vital Signs 1 5 Has the patient been seen at the hospital within the last three years: Yes Total Score: 115 Level Of Care: New/Established - Level 3 Electronic Signature(s) Signed: 04/27/2016 4:34:40 PM By: Alric Quan Entered By: Alric Quan on 04/27/2016 16:21:00 [name] ([id number]) -------------------------------------------------------------------------------- Encounter Discharge Information Details Patient Name: [name]. Date of Service: 04/27/2016 3:30 PM Medical Record Patient Account Number: [id number] Number: Treating RN: Ahmed Prima 06-07-1950 (66 y.o. Other Clinician: Date of Birth/Sex: Female)  Treating ROBSON, MICHAEL Primary Care Hewitt Garner: Mackie Pai Conna Terada/Extender: G Referring Clotilde Loth: Mal Misty in Treatment: 1 Encounter Discharge Information Items Discharge Pain Level: 0 Discharge Condition: Stable Ambulatory Status: Wheelchair Discharge Destination: Home Transportation: Other Accompanied By: self Schedule Follow-up Appointment: Yes Medication Reconciliation completed and provided to Patient/Care No Braylei Totino: Provided on Clinical Summary of Care: 04/27/2016 Form Type Recipient Paper Patient SO Electronic Signature(s) Signed: 04/27/2016 3:42:34 PM By: Ruthine Dose Entered By: Ruthine Dose on 04/27/2016 15:42:34 [name] ([id number]) -------------------------------------------------------------------------------- Lower Extremity Assessment Details Patient Name: [name]. Date of Service: 04/27/2016 3:30 PM Medical Record Patient Account Number: [id number] Number: Treating RN: Ahmed Prima May 24, 1950 (65 y.o. Other Clinician: Date of Birth/Sex: Female) Treating ROBSON, MICHAEL Primary Care Dierdre Mccalip: Mackie Pai Aleane Wesenberg/Extender: G Referring Samyah Bilbo: Mackie Pai Weeks in Treatment: 1 Vascular Assessment Pulses: Dorsalis Pedis Palpable: [Right:No] Doppler Audible: [Right:Inaudible] Posterior Tibial Extremity colors, hair growth, and conditions: Extremity Color: [Right:Normal] Temperature of Extremity: [Right:Cool] Capillary Refill: [Right:> 3 seconds] Electronic Signature(s) Signed: 04/27/2016 4:34:40 PM By: Alric Quan Entered By: Alric Quan on 04/27/2016 15:22:05 [name] ([id number]) -------------------------------------------------------------------------------- Multi Wound Chart Details Patient Name: [name]. Date of Service: 04/27/2016 3:30 PM Medical Record Patient Account Number: [id number] Number: Treating RN: Ahmed Prima [birth year] (65  y.o. Other Clinician: Date of Birth/Sex: Female) Treating ROBSON, MICHAEL Primary Care Lodie Waheed: Mackie Pai Izyk Marty/Extender: G Referring Taunya Goral: Mackie Pai Weeks in Treatment: 1 Vital Signs Height(in): 62 Pulse(bpm): 80 Weight(lbs): 226 Blood Pressure 141/67 (mmHg): Body Mass Index(BMI): 41 Temperature(F): Respiratory Rate 18 (breaths/min): Photos: [N/A:N/A] Wound Location: Right Foot - Distal, N/A N/A Circumfernential Wounding Event: Gradually Appeared N/A N/A Primary Etiology: Diabetic Wound/Ulcer of N/A N/A the Lower Extremity Comorbid History: Cataracts, Asthma, N/A N/A Chronic Obstructive Pulmonary Disease (COPD), Arrhythmia, Congestive Heart Failure, Hypertension, Myocardial Infarction, Type  II Diabetes, Gout, Osteoarthritis, Neuropathy Date Acquired: 03/30/2016 N/A N/A Weeks of Treatment: 1 N/A N/A Wound Status: Open N/A N/A Measurements L x W x D 4.5x20.1x0.1 N/A N/A (cm) Area (cm) : 71.039 N/A N/A Volume (cm) : 7.104 N/A N/A MCKINNA, DEMARS. ([id number]) % Reduction in Area: 0.00% N/A N/A % Reduction in Volume: 0.00% N/A N/A Classification: Grade 2 N/A N/A Exudate Amount: Large N/A N/A Exudate Type: Serosanguineous N/A N/A Exudate Color: red, brown N/A N/A Wound Margin: Distinct, outline attached N/A N/A Granulation Amount: Small (1-33%) N/A N/A Granulation Quality: Red, Pink N/A N/A Necrotic Amount: Large (67-100%) N/A N/A Necrotic Tissue: Eschar, Adherent Slough N/A N/A Exposed Structures: Fascia: No N/A N/A Fat Layer (Subcutaneous Tissue) Exposed: No Tendon: No Muscle: No Joint: No Bone: No Limited to Skin Breakdown Epithelialization: None N/A N/A Periwound Skin Texture: No Abnormalities Noted N/A N/A Periwound Skin No Abnormalities Noted N/A N/A Moisture: Periwound Skin Color: Erythema: Yes N/A N/A Erythema Location: Circumferential N/A N/A Temperature: No Abnormality N/A N/A Tenderness on Yes N/A  N/A Palpation: Wound Preparation: Ulcer Cleansing: N/A N/A Rinsed/Irrigated with Saline, Other: soap and water Topical Anesthetic Applied: Other: lidocaine 4% Treatment Notes Wound #1 (Right, Distal, Circumferential Foot) 1. Cleansed with: Clean wound with Normal Saline 2. Anesthetic Topical Lidocaine 4% cream to wound bed prior to debridement 5. Secondary Dressing Applied Kerlix/Conform 7. Secured with Tape QUIN, MCPHERSON ([id number]) Notes netting Electronic Signature(s) Signed: 04/28/2016 3:56:26 PM By: Linton Ham MD Entered By: Linton Ham on 04/27/2016 Ambia, [place] ([id number]) -------------------------------------------------------------------------------- [place] Details Patient Name: NESTA, SCATURRO. Date of Service: 04/27/2016 3:30 PM Medical Record Patient Account Number: [id number] Number: Treating RN: Ahmed Prima [birth year] (65 y.o. Other Clinician: Date of Birth/Sex: Female) Treating ROBSON, MICHAEL Primary Care Rosealie Reach: Mackie Pai Aprill Banko/Extender: G Referring Champ Keetch: Mal Misty in Treatment: 1 Active Inactive Electronic Signature(s) Signed: 05/05/2016 11:18:32 AM By: Gretta Cool RN, BSN, Kim RN, BSN Signed: 05/07/2016 5:07:46 PM By: Alric Quan Previous Signature: 04/27/2016 4:34:40 PM Version By: Alric Quan Entered By: Gretta Cool RN, BSN, Kim on 05/05/2016 11:18:31 [name] ([id number]) -------------------------------------------------------------------------------- Pain Assessment Details Patient Name: KYNZLEIGH, BANDEL. Date of Service: 04/27/2016 3:30 PM Medical Record Patient Account Number: [id number] Number: Treating RN: Ahmed Prima 11/02/1950 (65 y.o. Other Clinician: Date of Birth/Sex: Female) Treating ROBSON, MICHAEL Primary Care Kamyah Wilhelmsen: Mackie Pai Taraya Steward/Extender: G Referring Christianna Belmonte: Mackie Pai Weeks in Treatment:  1 Active Problems Location of Pain Severity and Description of Pain Patient Has Paino Yes Site Locations Pain Location: Pain in Ulcers With Dressing Change: Yes Rate the pain. Current Pain Level: 10 Character of Pain Describe the Pain: Aching Pain Management and Medication Current Pain Management: Electronic Signature(s) Signed: 04/27/2016 4:34:40 PM By: Alric Quan Entered By: Alric Quan on 04/27/2016 15:09:17 [name] ([id number]) -------------------------------------------------------------------------------- Patient/Caregiver Education Details Patient Name: [name]. Date of Service: 04/27/2016 3:30 PM Medical Record Patient Account Number: [id number] Number: Treating RN: Ahmed Prima [birth year] (65 y.o. Other Clinician: Date of Birth/Gender: Female) Treating ROBSON, MICHAEL Primary Care Physician/Extender: Sabino Gasser, EDWARD Physician: Suella [place] in Treatment: 1 Referring Physician: Mackie Pai Education Assessment Education Provided To: Patient Education Topics Provided Wound/Skin Impairment: Handouts: Other: change dressing as ordered Methods: Demonstration, Explain/Verbal Responses: State content correctly Electronic Signature(s) Signed: 04/27/2016 4:34:40 PM By: Alric Quan Entered By: Alric Quan on 04/27/2016 15:25:53 [name] ([id number]) -------------------------------------------------------------------------------- Wound Assessment Details Patient Name: [name]. Date of Service: 04/27/2016 3:30 PM Medical Record  Patient Account Number: [id number] Number: Treating RN: Ahmed Prima [birth year] (65 y.o. Other Clinician: Date of Birth/Sex: Female) Treating ROBSON, [place] Primary Care Sylis Ketchum: Mackie Pai Denman Pichardo/Extender: G Referring Mane Consolo: Mackie Pai Weeks in Treatment: 1 Wound Status Wound Number: 1 Primary Diabetic Wound/Ulcer of the Lower Etiology:  Extremity Wound Location: Right Foot - Distal, Circumfernential Wound Open Status: Wounding Event: Gradually Appeared Comorbid Cataracts, Asthma, Chronic Obstructive Date Acquired: 03/30/2016 History: Pulmonary Disease (COPD), Weeks Of Treatment: 1 Arrhythmia, Congestive Heart Failure, Clustered Wound: No Hypertension, Myocardial Infarction, Type II Diabetes, Gout, Osteoarthritis, Neuropathy Photos Photo Uploaded By: Alric Quan on 04/27/2016 15:55:57 Wound Measurements Length: (cm) 4.5 Width: (cm) 20.1 Depth: (cm) 0.1 Area: (cm) 71.039 Volume: (cm) 7.104 % Reduction in Area: 0% % Reduction in Volume: 0% Epithelialization: None Tunneling: No Undermining: No Wound Description Classification: Grade 2 Wound Margin: Distinct, outline attached Exudate Amount: Large Exudate Type: Serosanguineous Exudate Color: red, brown ALEXIA, DINGER ([id number]) Foul Odor After Cleansing: No Slough/Fibrino Yes Wound Bed Granulation Amount: Small (1-33%) Exposed Structure Granulation Quality: Red, Pink Fascia Exposed: No Necrotic Amount: Large (67-100%) Fat Layer (Subcutaneous Tissue) Exposed: No Necrotic Quality: Eschar, Adherent Slough Tendon Exposed: No Muscle Exposed: No Joint Exposed: No Bone Exposed: No Limited to Skin Breakdown Periwound Skin Texture Texture Color No Abnormalities Noted: No No Abnormalities Noted: No Erythema: Yes Moisture Erythema Location: Circumferential No Abnormalities Noted: No Temperature / Pain Temperature: No Abnormality Tenderness on Palpation: Yes Wound Preparation Ulcer Cleansing: Rinsed/Irrigated with Saline, Other: soap and water, Topical Anesthetic Applied: Other: lidocaine 4%, Electronic Signature(s) Signed: 04/27/2016 4:34:40 PM By: Alric Quan Entered By: Alric Quan on 04/27/2016 15:14:04 [name] ([id number]) -------------------------------------------------------------------------------- [place]  Details Patient Name: [name] Date of Service: 04/27/2016 3:30 PM Medical Record Patient Account Number: [id number] Number: Treating RN: Ahmed Prima 1951-02-15 (65 y.o. Other Clinician: Date of Birth/Sex: Female) Treating ROBSON, MICHAEL Primary Care Willey Due: Mackie Pai Azriel Dancy/Extender: G Referring Camdyn Beske: Mackie Pai Weeks in Treatment: 1 Vital Signs Time Taken: 15:10 Pulse (bpm): 80 Height (in): 62 Respiratory Rate (breaths/min): 18 Weight (lbs): 226 Blood Pressure (mmHg): 141/67 Body Mass Index (BMI): 41.3 Reference Range: 80 - 120 mg / dl Electronic Signature(s) Signed: 04/27/2016 4:34:40 PM By: Alric Quan Entered By: Alric Quan on 04/27/2016 15:11:40

## 2016-04-29 NOTE — Progress Notes (Signed)
   VASCULAR SURGERY ASSESSMENT & PLAN:  Excellent result today from arthrectomy and PTA of right SFA and popliteal arteries. Appreciate Dr. Claretha Cooper help.   I have written for dressing changes for the left foot.  She was started on Plavix. May resume Eliquis tomorrow.   I can follow the right foot as an out-pt, but may need a few more days of IV ABx.   SUBJECTIVE: Comfortable   PHYSICAL EXAM: Vitals:   04/29/16 1250 04/29/16 1400 04/29/16 1415 04/29/16 1430  BP: (!) 117/53 (!) 97/39 (!) 123/59 (!) 106/14  Pulse: (!) 0 (!) 52 75 67  Resp: (!) 0 11 17 17   Temp:      TempSrc:      SpO2: (!) 0% 94% 98% 94%  Weight:      Height:       Good peroneal signal with the doppler.   LABS: Lab Results  Component Value Date   WBC 5.4 04/29/2016   HGB 11.7 (L) 04/29/2016   HCT 36.9 04/29/2016   MCV 100.0 04/29/2016   PLT 93 (L) 04/29/2016   Lab Results  Component Value Date   CREATININE 1.06 (H) 04/29/2016   Lab Results  Component Value Date   INR 1.13 04/29/2016   CBG (last 3)   Recent Labs  04/29/16 0148 04/29/16 0635 04/29/16 0947  GLUCAP 137* 124* 119*    Principal Problem:   Gangrene of foot (HCC) Active Problems:   Diabetes ([place])   HTN (hypertension)   Hypothyroidism   Hyperlipidemia   Thrombocytopenia (HCC)   Persistent atrial fibrillation ([place])   Gae Gallop Beeper: [phone] 04/29/2016

## 2016-04-29 NOTE — Progress Notes (Signed)
15f sheath aspirated and removed from LFA. Manual pressure applied for 20 minutes. Groin level 0 No S+S of hematoma.  Tegaderm dressing applied, bedrest instructions given.  Bilateral pulses present with doppler.    Bedrest begins at 14:45:00

## 2016-04-29 NOTE — Progress Notes (Signed)
NYARI, OLSSON ([id number]) Visit Report for 04/27/2016 Chief Complaint Document Details Patient Name: Alexis [name], Alexis [name]. Date of Service: 04/27/2016 3:30 PM Medical Record Patient Account Number: [id number] Number: Treating RN: Ahmed Prima 1950-10-27 (65 y.o. Other Clinician: Date of Birth/Sex: Female) Treating Taraann Olthoff Primary Care Provider: Mackie Pai Provider/Extender: G Referring Provider: Mal Misty in Treatment: 1 Information Obtained from: Patient Chief Complaint 04/20/16; the patient is here for problems with her toes on the right foot. Electronic Signature(s) Signed: 04/28/2016 3:56:26 PM By: Linton Ham MD Entered By: Linton Ham on 04/27/2016 16:09:46 Alexis [name] ([id number]) -------------------------------------------------------------------------------- HPI Details Patient Name: Alexis [name] Date of Service: 04/27/2016 3:30 PM Medical Record Patient Account Number: [id number] Number: Treating RN: Ahmed Prima 07-26-1950 (65 y.o. Other Clinician: Date of Birth/Sex: Female) Treating Jammie Clink Primary Care Provider: Mackie Pai Provider/Extender: G Referring Provider: Mackie Pai Weeks in Treatment: 1 History of Present Illness HPI Description: 04/20/16; the patient tells me that a little over 3 weeks ago she applied faltering cream to her BILATERAL toes hoping to relieve pain of diabetic neuropathy in her feet. He days later she was picking some material off that she thought was dried ointment and it actually was denuding skin. Skin have literally peeled off her toes and the toes became very erythematous and painful. She was referred to her diet wrist he wasn't sure of the diagnosis. Noted difficulty feeling the pedal pulses in the right foot and referred her to vascular surgery for Doppler imaging however she has canceled this appointment and asked that they've week rebook her.  Since there is toes became irritated and painful she has been using a topical anesthetic anesthetic [ACD cream] and triple antibiotic cream. She has not gotten any relief. She is here for our review of this issue. The patient is a type II diabetic with an insulin pump. Staff could not get ABIs in our clinic as the Doppler signal was inaudible however s He did have arterial Dopplers in February 2015. This showed an ABI on the right at 1.10 and a TBI on the right of greater than 0.7. The left ABI was 1.04 and the left TBI was greater than 0.7. Both of these are within normal limits. 04/27/16; the patient arrives today with really excruciating pain in her right foot. He states she could not tolerate the ketoconazole I put her on her last week. The silver alginate stuck to her toes and was painful therefore she is not really been applying anything to over toes. 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 We had noted signs of arterial insufficiency last week and her arterial studies with Dr. Velva Harman however are not booked in Friday 3 days from now Electronic Signature(s) Signed: 04/28/2016 3:56:26 PM By: Linton Ham MD Entered By: Linton Ham on 04/27/2016 16:11:44 Alexis [name] ([id number]) -------------------------------------------------------------------------------- Physical Exam Details Patient Name: Alexis [name]. Date of Service: 04/27/2016 3:30 PM Medical Record Patient Account Number: [id number] Number: Treating RN: Ahmed Prima November 30, 1950 (65 y.o. Other Clinician: Date of Birth/Sex: Female) Treating Jleigh Striplin Primary Care Provider: Mackie Pai Provider/Extender: G Referring Provider: Mackie Pai Weeks in Treatment: 1 Constitutional Sitting or standing Blood Pressure is within target range for patient.. Pulse regular and within target range for patient.Marland Kitchen Respirations  regular, non-labored and within target range.. Temperature is normal and within the target range for the patient.. The patient looks very uncomfortable.  Cardiovascular I cannot feel her dorsalis pedis, posterior tibial or femoral pulse on the right. Notes Wound exam; her toes of really deteriorated. There is still erythema thematous since scalded looking with some erythema in the forefoot however it looks like there is dry gangrene developing on the first second and third toes. Any manipulation of her feet is exceptionally painful for this patient Electronic Signature(s) Signed: 04/28/2016 3:56:26 PM By: Linton Ham MD Entered By: Linton Ham on 04/27/2016 16:13:33 Alexis [name] ([id number]) -------------------------------------------------------------------------------- Physician Orders Details Patient Name: Alexis [name] Date of Service: 04/27/2016 3:30 PM Medical Record Patient Account Number: [id number] Number: Treating RN: Ahmed Prima 09/13/1950 (65 y.o. Other Clinician: Date of Birth/Sex: Female) Treating Kabria Hetzer Primary Care Provider: Mackie Pai Provider/Extender: G Referring Provider: Mal Misty in Treatment: 1 Verbal / Phone Orders: Yes Clinician: Pinkerton, Debi Read Back and Verified: Yes Diagnosis Coding Wound Cleansing Wound #1 Right,Distal,Circumferential Foot o Clean wound with wound cleanser. o Cleanse wound with mild soap and water Secondary Dressing Wound #1 Right,Distal,Circumferential Foot o Gauze and Kerlix/Conform Follow-up Appointments Wound #1 Right,Distal,Circumferential Foot o Return Appointment in 1 week. Edema Control Wound #1 Right,Distal,Circumferential Foot o Elevate legs to the level of the heart and pump ankles as often as possible Additional Orders / Instructions Wound #1 Right,Distal,Circumferential Foot o Other: - Go to ER Electronic Signature(s) Signed: 04/27/2016  4:34:40 PM By: Alric Quan Signed: 04/28/2016 3:56:26 PM By: Linton Ham MD Entered By: Alric Quan on 04/27/2016 15:42:09 Alexis [name] ([id number]) -------------------------------------------------------------------------------- Problem List Details Patient Name: Alexis [name]. Date of Service: 04/27/2016 3:30 PM Medical Record Patient Account Number: [id number] Number: Treating RN: Ahmed Prima November 03, 1950 (65 y.o. 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 Date of Service: 04/27/2016 3:30 PM Medical Record Patient Account Number: [id number] Number: Treating RN: Ahmed Prima 10-Apr-1950 (65 y.o. Other Clinician: Date of Birth/Sex: Female) Treating Kemonte Ullman Primary Care Provider: Mackie Pai Provider/Extender: G Referring Provider: Mackie Pai Weeks in Treatment: 1 Subjective Chief Complaint Information obtained from Patient 04/20/16; the patient is here for problems with her toes on the right foot. History of Present Illness (HPI) 04/20/16; the patient tells me  that a little over 3 weeks ago she applied faltering cream to her BILATERAL toes hoping to relieve pain of diabetic neuropathy in her feet. He days later she was picking some material off that she thought was dried ointment and it actually was denuding skin. Skin have literally peeled off her toes and the toes became very erythematous and painful. She was referred to her diet wrist he wasn't sure of the diagnosis. Noted difficulty feeling the pedal pulses in the right foot and referred her to vascular surgery for Doppler imaging however she has canceled this appointment and asked that they've week rebook her. Since there is toes became irritated and painful she has been using a topical anesthetic  anesthetic [ACD cream] and triple antibiotic cream. She has not gotten any relief. She is here for our review of this issue. The patient is a type II diabetic with an insulin pump. Staff could not get ABIs in our clinic as the Doppler signal was inaudible however s He did have arterial Dopplers in February 2015. This showed an ABI on the right at 1.10 and a TBI on the right of greater than 0.7. The left ABI was 1.04 and the left TBI was greater than 0.7. Both of these are within normal limits. 04/27/16; the patient arrives today with really excruciating pain in her right foot. He states she could not tolerate the ketoconazole I put her on her last week. The silver alginate stuck to her toes and was painful therefore she is not really been applying anything to over toes. 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 We had noted signs of arterial insufficiency last week and her arterial studies with Dr. Velva Harman however are not booked in Friday 3 days from now Objective Constitutional Sitting or standing Blood Pressure is within target range for patient.. Pulse regular and within target range Alexis [name], Alexis [name] ([id number]) for patient.Marland Kitchen  Respirations regular, non-labored and within target range.. Temperature is normal and within the target range for the patient.. The patient looks very uncomfortable. Vitals Time Taken: 3:10 PM, Height: 62 in, Weight: 226 lbs, BMI: 41.3, Pulse: 80 bpm, Respiratory Rate: 18 breaths/min, Blood Pressure: 141/67 mmHg. Cardiovascular I cannot feel her dorsalis pedis, posterior tibial or femoral pulse on the right. General Notes: Wound exam; her toes of really deteriorated. There is still erythema thematous since scalded looking with some erythema in the forefoot however it looks like there is dry gangrene developing on the first second and third toes. Any manipulation of her feet is exceptionally painful for this patient Integumentary (Hair, Skin) Wound #1 status is Open. Original cause of wound was Gradually Appeared. The wound is located on the Right,Distal,Circumferential Foot. The wound measures 4.5cm length x 20.1cm width x 0.1cm depth; 71.039cm^2 area and 7.104cm^3 volume. The wound is limited to skin breakdown. There is no tunneling or undermining noted. There is a large amount of serosanguineous drainage noted. The wound margin is distinct with the outline attached to the wound base. There is small (1-33%) red, pink granulation within the wound bed. There is a large (67-100%) amount of necrotic tissue within the wound bed including Eschar and Adherent Slough. The periwound skin appearance exhibited: Erythema. The surrounding wound skin color is noted with erythema which is circumferential. Periwound temperature was noted as No Abnormality. The periwound has tenderness on palpation. 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 Cleanse wound with mild soap and water Alexis [name], Alexis [name]. ([id number]) Secondary Dressing: Wound #1 Right,Distal,Circumferential Foot: Gauze and Kerlix/Conform Follow-up Appointments: Wound #1 Right,Distal,Circumferential Foot: Return Appointment in 1 week. Edema Control: Wound #1 Right,Distal,Circumferential Foot: Elevate legs to the level of the heart and pump ankles as often as possible Additional Orders / Instructions: Wound #1 Right,Distal,Circumferential Foot: Other: - Go to ER Limb threatening ischemia: Her arterial studies were booked for later this week however  this patient has deteriorated and is extreme pain. I think she will need admission to hospital for arterial evaluation and an urgent attempt to revascualrize i have called the EDP to discuss Electronic Signature(s) Signed: 04/28/2016 3:56:26 PM By: Linton Ham MD Entered By: Linton Ham on 04/27/2016 16:18:02 Alexis [name] ([id number]) -------------------------------------------------------------------------------- [place] Details Patient Name: Alexis [name]. Date of Service: 04/27/2016 Medical Record Patient Account Number: [id number] Number: Treating RN: Ahmed Prima Mar 28, 1950 (65 y.o. Other Clinician: Date of Birth/Sex: Female) Treating Natasa Stigall, [place] Primary Care Provider: Mackie Pai Provider/Extender: G Referring Provider: Mackie Pai Service Line: Outpatient Weeks in Treatment: 1 Diagnosis Coding ICD-10 Codes Code Description L97.518 Non-pressure chronic ulcer of other part of right foot with other specified severity B35.3 Tinea pedis E11.42 Type 2 diabetes mellitus with diabetic polyneuropathy E11.51 Type 2 diabetes mellitus with diabetic peripheral angiopathy without gangrene Facility Procedures CPT4 Code: [id number] Description: 99213 - WOUND CARE VISIT-LEV 3 EST  PT Modifier: Quantity: 1 Physician Procedures CPT4: Description Modifier Quantity Code [id number] 99213 - WC PHYS LEVEL 3 - EST PT 1 ICD-10 Description Diagnosis L97.518 Non-pressure chronic ulcer of other part of right foot with other specified severity E11.51 Type 2 diabetes mellitus with diabetic  peripheral angiopathy without gangrene Electronic Signature(s) Signed: 04/27/2016 4:34:40 PM By: Alric Quan Signed: 04/28/2016 3:56:26 PM By: Linton Ham MD Entered By: Alric Quan on 04/27/2016 16:21:13

## 2016-04-30 ENCOUNTER — Ambulatory Visit: Payer: Medicare HMO | Admitting: Cardiovascular Disease

## 2016-04-30 ENCOUNTER — Encounter (HOSPITAL_COMMUNITY)
Admission: EM | Disposition: A | Discharge status: Home Health | Payer: Self-pay | Source: Home / Self Care | Attending: Family Medicine

## 2016-04-30 LAB — HEPARIN LEVEL (UNFRACTIONATED): HEPARIN UNFRACTIONATED: 0.6 [IU]/mL (ref 0.30–0.70)

## 2016-04-30 LAB — CBC
HEMATOCRIT: 37.1 % (ref 36.0–46.0)
Hemoglobin: 11.8 g/dL — ABNORMAL LOW (ref 12.0–15.0)
MCH: 32.1 pg (ref 26.0–34.0)
MCHC: 31.8 g/dL (ref 30.0–36.0)
MCV: 100.8 fL — ABNORMAL HIGH (ref 78.0–100.0)
PLATELETS: 100 10*3/uL — AB (ref 150–400)
RBC: 3.68 MIL/uL — ABNORMAL LOW (ref 3.87–5.11)
RDW: 14.1 % (ref 11.5–15.5)
WBC: 7.3 10*3/uL (ref 4.0–10.5)

## 2016-04-30 LAB — GLUCOSE, CAPILLARY
GLUCOSE-CAPILLARY: 255 mg/dL — AB (ref 65–99)
Glucose-Capillary: 181 mg/dL — ABNORMAL HIGH (ref 65–99)
Glucose-Capillary: 136 mg/dL — ABNORMAL HIGH (ref 65–99)
Glucose-Capillary: 173 mg/dL — ABNORMAL HIGH (ref 65–99)

## 2016-04-30 LAB — APTT: aPTT: 61 seconds — ABNORMAL HIGH (ref 24–36)

## 2016-04-30 LAB — CREATININE, SERUM
CREATININE: 1.18 mg/dL — AB (ref 0.44–1.00)
GFR calc Af Amer: 54 mL/min — ABNORMAL LOW (ref >60)
GFR, EST NON AFRICAN AMERICAN: 47 mL/min — AB (ref >60)

## 2016-04-30 SURGERY — BYPASS GRAFT FEMORAL-POPLITEAL ARTERY
Anesthesia: General | Laterality: Right

## 2016-04-30 MED ORDER — CLOPIDOGREL BISULFATE 75 MG PO TABS
75.0000 mg | ORAL_TABLET | Freq: Every day | ORAL | Status: DC
  Administered 2016-05-01 – 2016-05-02 (×2): 75 mg via ORAL
  Filled 2016-04-30 (×2): qty 1

## 2016-04-30 NOTE — Progress Notes (Addendum)
  VASCULAR SURGERY ASSESSMENT & PLAN:  Excellent result from arthrectomy and PTA of right SFA and popliteal arteries. Appreciate Dr. Claretha Cooper help. Good doppler flow right foot.  Continue dressing changes to right foot (soak foot daily in luke warm Dial soap soaks and keep dry 2X2's between toes)  On Plavix now. Resume Eliquis today.   On IV Vanco and Zosyn. I would continue for another 48 hours and then could go home on po ABx.   I can follow the right foot as an out-pt.   [name], MD, FACS Beeper [phone] Office: [phone]  Subjective  - Doing well and pleased with her post op results  Objective (!) 129/97 72 97.5 F (36.4 C) 17 99%  Intake/Output Summary (Last 24 hours) at 04/30/16 0350 Last data filed at 04/29/16 1821  Gross per 24 hour  Intake              170 ml  Output                0 ml  Net              170 ml   Right doppler signals DP/PT Left groin soft Left foot with pale/bluish toes no open wounds   Assessment/Planning: POD # 2  1.  US guided cannulation of left common femoral artery 2.  Aortogram with bilateral lower extremity runoff 3.  Jetstream atherectomy of right SFA and popliteal arteries 4.  Drug coated balloon angioplasty of Right sfa and popliteal arteries with 75m impact 5.  Moderate sedation with fentanyl and versed for 93 minutes  Place dry guaze between her her toes  Findings: Aorta and iliac segments are patent without significant disease. On the left there is an area of SFA that has 50% stenosis. Dominant vessel appears to be peroneal artery on the left. On the right nose an area of greater than 70% stenosis in the proximal SFA. There are multiple areas of stenoses that are not hemodynamically significant throughout the SFA. The distal SFA and popliteal artery are occluded and reconstituted after approximately 2 cm dominant runoff to the foot is the peroneal and the PT artery also to the ankle. Following intervention there is  0% residual stenosis in the SFA or popliteal arteries on the right and runoff is via the peroneal artery and fills the foot via a medial calcaneal branch.   CLaurence SlateMMiddle Tennessee Ambulatory Surgery Center3/04/2016 8:22 AM --  Laboratory Lab Results:  Recent Labs  04/29/16 0310 04/30/16 0748  WBC 5.4 7.3  HGB 11.7* 11.8*  HCT 36.9 37.1  PLT 93* 100*   BMET  Recent Labs  04/28/16 2242 04/29/16 0310  NA 133* 138  K 4.2 4.3  CL 97* 104  CO2 25 26  GLUCOSE 180* 122*  BUN 25* 24*  CREATININE 1.11* 1.06*  CALCIUM 9.1 9.2    COAG Lab Results  Component Value Date   INR 1.13 04/29/2016   No results found for: PTT

## 2016-04-30 NOTE — Progress Notes (Signed)
ANTICOAGULATION CONSULT NOTE - Follow Up Consult  Pharmacy Consult for Heparin and Vanco/Zosyn Indication: afib with h/o CVA, wound infection   Allergies  Allergen Reactions  . Indomethacin Other (See Comments)     Renal Insufficiency  . Pregabalin Other (See Comments)    DIZZINESS   . Sulfa Antibiotics Hives    Patient Measurements: Height: 5' 2"  (157.5 cm) Weight: 233 lb 4 oz (105.8 kg) IBW/kg (Calculated) : 50.1 Heparin Dosing Weight:   Vital Signs: Temp: 97.5 F (36.4 C) (03/02 0547) BP: 129/97 (03/02 0547) Pulse Rate: 72 (03/02 0547)  Labs:  Recent Labs  04/28/16 1423 04/28/16 2242 04/29/16 0310 04/30/16 0748  HGB  --  11.3* 11.7* 11.8*  HCT  --  34.6* 36.9 37.1  PLT  --  91* 93* 100*  APTT 49* 51*  --  61*  LABPROT  --   --  14.6  --   INR  --   --  1.13  --   HEPARINUNFRC 1.60*  --   --  0.60  CREATININE  --  1.11* 1.06* 1.18*    Estimated Creatinine Clearance: 52.9 mL/min (by C-G formula based on SCr of 1.18 mg/dL (H)).  Assessment:  Anticoag: last dose of Eliquis taken 2/27 at 0900. restart heparin 3/1 pm, apix planned to restart 3/2. HL 0.6, aptt 61 in goal.Hgb and plts stable. - S/p arthrectomy 3/1 and R SFA and popliteal arteries  ID: wound infection, Vanco/Zosyn #3. Scr 1.18 stable. CrCl 52. Will not check level today as timing off 3/1. 2/28 vanc>> 2/28 zosyn>>  2/27: BC x 2>>pending   Goal of Therapy:  Heparin level 0.3-0.7 units/ml Monitor platelets by anticoagulation protocol: Yes   Plan:  D/c aptt levels Continue IV heparin at 1450 units/hr Zosyn 3.375g IV q8hr Vanco 711m IV q12hrs. Level this weekend.   Corney Knighton S. RAlford Highland PharmD, BCPS Clinical Staff Pharmacist Pager [phone] REilene GhaziStillinger 04/30/2016,9:18 AM

## 2016-04-30 NOTE — Progress Notes (Signed)
Foot care provided as ordered, well tolerated, will continue to monitor

## 2016-04-30 NOTE — Progress Notes (Signed)
PROGRESS NOTE    Alexis [name]  HFW:[id number] DOB: 21-May-1950 DOA: 04/27/2016 PCP: Mackie Pai, PA-C    Brief Narrative:  66 y.o. female with medical history significant of HTN, HLD, PVD, A.fib  on Eliquis, DM type II on insulin pump, CHF, COPD, s/p PM, and MI; who presents from wound care clinic for worsening right foot wound. Patient notes symptoms have progressively worsened over the past 1-1/2 months. Patient reports being seen by her primary care provider who initially sent her to podiatry. The podiatrist felt that the patient need to be seen by vascular surgeon, but patient no show for this appointment and requested referral to wound care. Patient previously had been using Voltran gel with some mild relief initially, hydrocortisone cream, and possibly nystatin cream. Associated symptoms that progressively worsened include pain with ambulation in the right foot, worsening peeling, and erythema. The patient was sent to wound care clinic and reports initially being recommended continue the same. However, yesterday patient was noted to have new necrotic appearing areas on the first and fifth toe for which she was referred to the hospital for further evaluation. Review of records shows that patient was noted to have claudication symptoms during her initial podiatry evaluation, which arterial studies were recommended.    Assessment & Plan:   Principal Problem:   Gangrene of foot [facility]) - Vascular surgeon managing. Patient is s/p arthrectomy and PTA of right SFA and popliteal arteries. - Zosyn and Vancomycin on board would appreciate vascular surgery recommendations regarding antibiotic coverage ? Narrowing ?d/c'ing  Active Problems:   Diabetes ([place]) - Carb modified diet. - place on SSI    HTN (hypertension) - pt is currently on metoprolol and ramipril. Relatively well controlled    Hypothyroidism - continue synthroid    Hyperlipidemia   Thrombocytopenia ([place]) - improved on last  check.    Persistent atrial fibrillation (HCC) - continue b blocker and heparin  DVT prophylaxis: on Heparin Code Status: DNR Family Communication: none at bedside Disposition Plan: pending recommendations by vascular surgeon   Consultants:   Vascular surgeon   Procedures:  POD # 2  1. US guided cannulation of left common femoral artery 2. Aortogram with bilateral lower extremity runoff 3. Jetstream atherectomy of right SFA and popliteal arteries 4. Drug coated balloon angioplasty of Right sfa and popliteal arteries with 67m impact  Antimicrobials: zosyn, vancomycin  Subjective: Pt has no new complaints and is happy that she has a pulse at her RLE  Objective: Vitals:   04/29/16 1730 04/29/16 1800 04/29/16 2019 04/30/16 0547  BP: 96/67 93/81 97/70  (!) 129/97  Pulse: 62 62 100 72  Resp:   17 17  Temp:   97.5 F (36.4 C) 97.5 F (36.4 C)  TempSrc:   Axillary   SpO2:   100% 99%  Weight:      Height:        Intake/Output Summary (Last 24 hours) at 04/30/16 0917 Last data filed at 04/30/16 0911  Gross per 24 hour  Intake              410 ml  Output                0 ml  Net              410 ml   Filed Weights   04/28/16 0255  Weight: 105.8 kg (233 lb 4 oz)    Examination:  General exam: Appears calm and comfortable, in nad Respiratory system: Clear  to auscultation. Respiratory effort normal. Cardiovascular system: S1 & S2 heard, No JVD, murmurs, rubs,  Gastrointestinal system: Abdomen is nondistended, soft and nontender. No organomegaly or masses felt. Normal bowel sounds heard. Central nervous system: Alert and oriented. No focal neurological deficits. Extremities: gangrene changes at RLE Skin: No rashes, lesions or ulcers, see above Psychiatry: Judgement and insight appear normal. Mood & affect appropriate.   Data Reviewed: I have personally reviewed following labs and imaging studies  CBC:  Recent Labs Lab 04/27/16 1943 04/28/16 2242  04/29/16 0310 04/30/16 0748  WBC 6.7 5.1 5.4 7.3  NEUTROABS 4.7  --   --   --   HGB 12.8 11.3* 11.7* 11.8*  HCT 37.0 34.6* 36.9 37.1  MCV 94.9 99.1 100.0 100.8*  PLT SPECIMEN CHECKED FOR CLOTS 91* 93* 053*   Basic Metabolic Panel:  Recent Labs Lab 04/27/16 1943 04/28/16 2242 04/29/16 0310 04/30/16 0748  NA 137 133* 138  --   K 3.6 4.2 4.3  --   CL 102 97* 104  --   CO2 26 25 26   --   GLUCOSE 176* 180* 122*  --   BUN 27* 25* 24*  --   CREATININE 0.80 1.11* 1.06* 1.18*  CALCIUM 9.5 9.1 9.2  --    GFR: Estimated Creatinine Clearance: 52.9 mL/min (by C-G formula based on SCr of 1.18 mg/dL (H)). Liver Function Tests: No results for input(s): AST, ALT, ALKPHOS, BILITOT, PROT, ALBUMIN in the last 168 hours. No results for input(s): LIPASE, AMYLASE in the last 168 hours. No results for input(s): AMMONIA in the last 168 hours. Coagulation Profile:  Recent Labs Lab 04/29/16 0310  INR 1.13   Cardiac Enzymes: No results for input(s): CKTOTAL, CKMB, CKMBINDEX, TROPONINI in the last 168 hours. BNP (last 3 results)  Recent Labs  02/10/16 1219  PROBNP 60.0   HbA1C: No results for input(s): HGBA1C in the last 72 hours. CBG:  Recent Labs Lab 04/29/16 0635 04/29/16 0947 04/29/16 1619 04/29/16 2144 04/30/16 0557  GLUCAP 124* 119* 84 167* 181*   Lipid Profile: No results for input(s): CHOL, HDL, LDLCALC, TRIG, CHOLHDL, LDLDIRECT in the last 72 hours. Thyroid Function Tests:  Recent Labs  04/28/16 0656  TSH 25.297*   Anemia Panel: No results for input(s): VITAMINB12, FOLATE, FERRITIN, TIBC, IRON, RETICCTPCT in the last 72 hours. Sepsis Labs: No results for input(s): PROCALCITON, LATICACIDVEN in the last 168 hours.  Recent Results (from the past 240 hour(s))  Blood Cultures x 2 sites     Status: None (Preliminary result)   Collection Time: 04/27/16  7:42 PM  Result Value Ref Range Status   Specimen Description BLOOD LEFT ANTECUBITAL  Final   Special Requests  BOTTLES DRAWN AEROBIC AND ANAEROBIC 5ML EA  Final   Culture   Final    NO GROWTH 2 DAYS Performed at [facility], [address] [place], [place] [address]    Report Status PENDING  Incomplete  Blood Cultures x 2 sites     Status: None (Preliminary result)   Collection Time: 04/27/16  8:10 PM  Result Value Ref Range Status   Specimen Description BLOOD BLOOD LEFT HAND  Final   Special Requests BOTTLES DRAWN AEROBIC AND ANAEROBIC 5 CC EA  Final   Culture   Final    NO GROWTH 2 DAYS Performed at [facility], [employer] [address] [place], [place] [address]    Report Status PENDING  Incomplete  Radiology Studies: No results found.  Scheduled Meds: . allopurinol  100 mg Oral QHS  . clopidogrel  75 mg Oral Q breakfast  . DULoxetine  30 mg Oral QHS  . fluticasone  1 spray Each Nare QHS  . insulin aspart  0-9 Units Subcutaneous TID WC  . insulin pump   Subcutaneous TID AC, HS, 0200  . levothyroxine  175 mcg Oral QAC breakfast  . metoprolol succinate  25 mg Oral Daily  . piperacillin-tazobactam (ZOSYN)  IV  3.375 g Intravenous Q8H  . prenatal vitamin w/FE, FA  1 tablet Oral Daily  . ramipril  2.5 mg Oral Daily  . torsemide  10 mg Oral Daily  . vancomycin  750 mg Intravenous Q12H   Continuous Infusions: . sodium chloride 75 mL/hr at 04/29/16 0810  . heparin 1,450 Units/hr (04/29/16 2220)     LOS: 3 days    Time spent: > 35 minutes  Velvet Bathe, MD [facility] Pager [phone]  If 7PM-7AM, please contact night-coverage [email or web address] Password [facility] 04/30/2016, 9:17 AM

## 2016-04-30 NOTE — Care Management Important Message (Signed)
Important Message  Patient Details  Name: Alexis [name] MRN: [id number] Date of Birth: 12/01/1950   Medicare Important Message Given:  Yes    Nathen May 04/30/2016, 12:35 PM

## 2016-05-01 LAB — CBC
HEMATOCRIT: 33.2 % — AB (ref 36.0–46.0)
Hemoglobin: 10.8 g/dL — ABNORMAL LOW (ref 12.0–15.0)
MCH: 32.2 pg (ref 26.0–34.0)
MCHC: 32.5 g/dL (ref 30.0–36.0)
MCV: 99.1 fL (ref 78.0–100.0)
Platelets: 106 10*3/uL — ABNORMAL LOW (ref 150–400)
RBC: 3.35 MIL/uL — ABNORMAL LOW (ref 3.87–5.11)
RDW: 13.9 % (ref 11.5–15.5)
WBC: 7.6 10*3/uL (ref 4.0–10.5)

## 2016-05-01 LAB — GLUCOSE, CAPILLARY
GLUCOSE-CAPILLARY: 176 mg/dL — AB (ref 65–99)
GLUCOSE-CAPILLARY: 156 mg/dL — AB (ref 65–99)
GLUCOSE-CAPILLARY: 149 mg/dL — AB (ref 65–99)
GLUCOSE-CAPILLARY: 141 mg/dL — AB (ref 65–99)
Glucose-Capillary: 173 mg/dL — ABNORMAL HIGH (ref 65–99)

## 2016-05-01 LAB — HEPARIN LEVEL (UNFRACTIONATED): Heparin Unfractionated: 0.69 IU/mL (ref 0.30–0.70)

## 2016-05-01 LAB — CREATININE, SERUM
CREATININE: 1.3 mg/dL — AB (ref 0.44–1.00)
GFR calc non Af Amer: 41 mL/min — ABNORMAL LOW (ref >60)
GFR, EST AFRICAN AMERICAN: 48 mL/min — AB (ref >60)

## 2016-05-01 MED ORDER — PIPERACILLIN-TAZOBACTAM 3.375 G IVPB
3.3750 g | Freq: Three times a day (TID) | INTRAVENOUS | Status: DC
  Administered 2016-05-01 – 2016-05-02 (×2): 3.375 g via INTRAVENOUS
  Filled 2016-05-01 (×3): qty 50

## 2016-05-01 MED ORDER — APIXABAN 5 MG PO TABS
5.0000 mg | ORAL_TABLET | Freq: Two times a day (BID) | ORAL | Status: DC
  Administered 2016-05-01 – 2016-05-02 (×3): 5 mg via ORAL
  Filled 2016-05-01 (×3): qty 1

## 2016-05-01 MED ORDER — MAGNESIUM HYDROXIDE 400 MG/5ML PO SUSP: 30.0000 mL | Freq: Every day | ORAL | Status: DC | PRN

## 2016-05-01 NOTE — Progress Notes (Signed)
Patient refused giving herself insulin from her pump , she said she gets insulin only when her insulin is above 200, patient education completed will continue to monitor

## 2016-05-01 NOTE — Progress Notes (Signed)
ANTICOAGULATION CONSULT NOTE - Follow Up Consult  Pharmacy Consult for Eliquis Indication: atrial fibrillation  Assessment: 66 yoF previously on Eliquis for Afib prior to admission. Last dose of Eliquis was 2/27. Heparin was started while patient was admitted. Pharmacy is now consulted to discontinue heparin and restart Eliquis. Patient qualifies for 5 mg bid dosing (age 66, wt 105.8 kg, scr 1.3, nCrCl ~47) - hemoglobin and platelets are low but stable - no overt bleeding noted. Patient continues on Plavix therapy, no aspirin.   Goal of Therapy:  Monitor platelets by anticoagulation protocol: Yes   Plan:  Discontinue heparin Start Eliquis 5 mg po bid Monitor s/sx's bleeding and renal function   Allergies  Allergen Reactions  . Indomethacin Other (See Comments)     Renal Insufficiency  . Pregabalin Other (See Comments)    DIZZINESS   . Sulfa Antibiotics Hives    Patient Measurements: Height: 5' 2"  (157.5 cm) Weight: 233 lb 4 oz (105.8 kg) IBW/kg (Calculated) : 50.1  Vital Signs: Temp: 98.1 F (36.7 C) (03/03 0830) Temp Source: Oral (03/03 0830) BP: 139/77 (03/03 0830) Pulse Rate: 91 (03/03 0830)  Labs:  Recent Labs  04/28/16 1423  04/28/16 2242 04/29/16 0310 04/30/16 0748 05/01/16 0338  HGB  --   < > 11.3* 11.7* 11.8* 10.8*  HCT  --   < > 34.6* 36.9 37.1 33.2*  PLT  --   < > 91* 93* 100* 106*  APTT 49*  --  51*  --  61*  --   LABPROT  --   --   --  14.6  --   --   INR  --   --   --  1.13  --   --   HEPARINUNFRC 1.60*  --   --   --  0.60 0.69  CREATININE  --   < > 1.11* 1.06* 1.18* 1.30*  < > = values in this interval not displayed.  Estimated Creatinine Clearance: 48 mL/min (by C-G formula based on SCr of 1.3 mg/dL (H)).   Medications:  Scheduled:  . allopurinol  100 mg Oral QHS  . apixaban  5 mg Oral BID  . clopidogrel  75 mg Oral Daily  . DULoxetine  30 mg Oral QHS  . fluticasone  1 spray Each Nare QHS  . insulin aspart  0-9 Units Subcutaneous TID  WC  . insulin pump   Subcutaneous TID AC, HS, 0200  . levothyroxine  175 mcg Oral QAC breakfast  . metoprolol succinate  25 mg Oral Daily  . piperacillin-tazobactam (ZOSYN)  IV  3.375 g Intravenous Q8H  . prenatal vitamin w/FE, FA  1 tablet Oral Daily  . ramipril  2.5 mg Oral Daily  . torsemide  10 mg Oral Daily  . vancomycin  750 mg Intravenous Q12H    Belia Heman, PharmD PGY1 Pharmacy Resident [phone] (Pager) 05/01/2016 9:41 AM

## 2016-05-01 NOTE — Evaluation (Signed)
Physical Therapy Evaluation Patient Details Name: Alexis [name] MRN: [id number] DOB: October 09, 1950 Today's Date: 05/01/2016   History of Present Illness  Pt is a 66 y/o female admitted with right foot gangrene s/p Left common femoral a cannulation and right angioplasty. PMH including but not limited to A-fib, CHF, COPD, DM, HTN, hx of L TKA and history of "several" MI's.   Clinical Impression  Pt very pleasant and moving well with limitation due to painful and weeping RLE. Pt has family at home but alone during the day and states difficulty with cooking and caring for her dogs since would has developed. Pt with decreased gait, transfers and pain who will benefit from acute therapy to maximize mobility and gait to decrease burden of care.      Follow Up Recommendations Home health PT    Equipment Recommendations  3in1 (PT)    Recommendations for Other Services OT consult     Precautions / Restrictions Precautions Precautions: Fall Precaution Comments: right foot wound Required Braces or Orthoses: Other Brace/Splint Other Brace/Splint: Darco shoe right Restrictions Weight Bearing Restrictions: No      Mobility  Bed Mobility Overal bed mobility: Modified Independent             General bed mobility comments: with rail and increased time  Transfers Overall transfer level: Needs assistance   Transfers: Sit to/from Stand Sit to Stand: Min guard         General transfer comment: cues for hand placement as pt trying to hold RW with all transfers despite cues, decreased control of descent  Ambulation/Gait Ambulation/Gait assistance: Min guard Ambulation Distance (Feet): 20 Feet Assistive device: Rolling walker (2 wheeled) Gait Pattern/deviations: Step-to pattern;Decreased stance time - right   Gait velocity interpretation: Below normal speed for age/gender General Gait Details: pt with cues for safety with RW with pt with quick off weighting of RLE due to pain,  denied further distance  Stairs            Wheelchair Mobility    Modified Rankin (Stroke Patients Only)       Balance Overall balance assessment: Needs assistance   Sitting balance-Leahy Scale: Good       Standing balance-Leahy Scale: Fair                               Pertinent Vitals/Pain Pain Assessment: 0-10 Pain Score: 4  Pain Location: right foot Pain Descriptors / Indicators: Aching;Burning Pain Intervention(s): Limited activity within patient's tolerance;Repositioned;Monitored during session    Home Living Family/patient expects to be discharged to:: Private residence Living Arrangements: Spouse/significant other Available Help at Discharge: Family;Available PRN/intermittently Type of Home: House Home Access: Stairs to enter Entrance Stairs-Rails: None Entrance Stairs-Number of Steps: 2 Home Layout: One level Home Equipment: Walker - 2 wheels;Cane - single point;Transport chair      Prior Function Level of Independence: Independent with assistive device(s)         Comments: limited gait since foot pain began     Hand Dominance        Extremity/Trunk Assessment   Upper Extremity Assessment Upper Extremity Assessment: Overall WFL for tasks assessed    Lower Extremity Assessment Lower Extremity Assessment: Generalized weakness    Cervical / Trunk Assessment Cervical / Trunk Assessment: Normal  Communication   Communication: No difficulties  Cognition Arousal/Alertness: Awake/alert Behavior During Therapy: WFL for tasks assessed/performed Overall Cognitive Status: Within Functional Limits for tasks assessed  General Comments      Exercises     Assessment/Plan    PT Assessment Patient needs continued PT services  PT Problem List Decreased mobility;Decreased activity tolerance;Decreased balance;Decreased knowledge of use of DME;Pain;Impaired sensation       PT Treatment Interventions  DME instruction;Gait training;Stair training;Functional mobility training;Therapeutic activities;Therapeutic exercise;Balance training;Patient/family education    PT Goals (Current goals can be found in the Care Plan section)  Acute Rehab PT Goals Patient Stated Goal: return home PT Goal Formulation: With patient Time For Goal Achievement: 05/15/16 Potential to Achieve Goals: Fair    Frequency Min 3X/week   Barriers to discharge Decreased caregiver support spouse works during the day    Co-evaluation               End of Session Equipment Utilized During Treatment: Gait belt Activity Tolerance: Patient tolerated treatment well Patient left: in chair;with call bell/phone within reach;with chair alarm set Nurse Communication: Mobility status PT Visit Diagnosis: Difficulty in walking, not elsewhere classified (R26.2);Pain Pain - Right/Left: Right Pain - part of body: Ankle and joints of foot         Time: 0029-8473 PT Time Calculation (min) (ACUTE ONLY): 20 min   Charges:   PT Evaluation $PT Eval Moderate Complexity: 1 Procedure     PT G Codes:         Estanislao Harmon B Jeannetta Cerutti 05/27/16, 11:53 AM Elwyn Reach, [place]

## 2016-05-01 NOTE — Progress Notes (Addendum)
[facility] [place]  Subjective  - Very pleased with surgical results.   Objective 115/73 75 97.5 F (36.4 C) (Oral) 15 98%  Intake/Output Summary (Last 24 hours) at 05/01/16 0827 Last data filed at 04/30/16 1600  Gross per 24 hour  Intake           716.17 ml  Output                0 ml  Net           716.17 ml    Right foot with toe ulcers. Right DP/PT doppler signals  Left groin soft  Assessment/Planning: POD #3 1. US guided cannulation of left common femoral artery 2. Aortogram with bilateral lower extremity runoff 3. Jetstream atherectomy of right SFA and popliteal arteries 4. Drug coated balloon angioplasty of Right sfa and popliteal arteries with 77m impact 5. Moderate sedation with fentanyl and versed for 93 minutes dry dressing between toes, luke warm soaks PT for mobility ordered, may need equipment for home. On IV Vanco and Zosyn. I would continue for another 48 hours and then could go home on po ABx.  Eliquis ok to restart   CLaurence SlateMAdventist Health Feather [facility] Hospital3/04/2016 8:27 AM -- Agree with above.  Eliquis and plavix for at least 6 weeks.  If patient experiences any bleeding complication on this combo it is ok to stop her plavix but will try to go at least 6 weeks until artery heals luminal surface.  Continue local wound care with soap and water daily followed by dry gauze between toes.  CRuta Hinds MD [facility] [place]Office: [phone]: [phone] Laboratory Lab Results:  Recent Labs  04/30/16 0748 05/01/16 0338  WBC 7.3 7.6  HGB 11.8* 10.8*  HCT 37.1 33.2*  PLT 100* 106*   BMET  Recent Labs  04/28/16 2242 04/29/16 0310 04/30/16 0748 05/01/16 0338  NA 133* 138  --   --   K 4.2 4.3  --   --   CL 97* 104  --   --   CO2 25 26  --   --   GLUCOSE 180* 122*  --   --   BUN 25* 24*  --   --   CREATININE 1.11* 1.06* 1.18* 1.30*  CALCIUM 9.1 9.2  --   --     COAG Lab Results   Component Value Date   INR 1.13 04/29/2016   No results found for: PTT

## 2016-05-01 NOTE — Progress Notes (Signed)
PROGRESS NOTE    Alexis [name]  WUJ:[id number] DOB: 1950/06/07 DOA: 04/27/2016 PCP: Mackie Pai, PA-C    Brief Narrative:  66 y.o. female with medical history significant of HTN, HLD, PVD, A.fib  on Eliquis, DM type II on insulin pump, CHF, COPD, s/p PM, and MI; who presents from wound care clinic for worsening right foot wound. Patient notes symptoms have progressively worsened over the past 1-1/2 months. Patient reports being seen by her primary care provider who initially sent her to podiatry. The podiatrist felt that the patient need to be seen by vascular surgeon, but patient no show for this appointment and requested referral to wound care. Patient previously had been using Voltran gel with some mild relief initially, hydrocortisone cream, and possibly nystatin cream. Associated symptoms that progressively worsened include pain with ambulation in the right foot, worsening peeling, and erythema. The patient was sent to wound care clinic and reports initially being recommended continue the same. However, yesterday patient was noted to have new necrotic appearing areas on the first and fifth toe for which she was referred to the hospital for further evaluation. Review of records shows that patient was noted to have claudication symptoms during her initial podiatry evaluation, which arterial studies were recommended.    Assessment & Plan:   Principal Problem:   Gangrene of foot [facility]) - Vascular surgeon managing. Patient is s/p arthrectomy and PTA of right SFA and popliteal arteries. - Zosyn and Vancomycin for another day. Then switch to oral regimen next am.  Active Problems:   Diabetes ([facility]) - Carb modified diet. - place on SSI    HTN (hypertension) - pt is currently on metoprolol and ramipril. Relatively well controlled    Hypothyroidism - continue synthroid    Hyperlipidemia   Thrombocytopenia ([place]) - improved on last check.    Persistent atrial fibrillation ([place]) -  continue b blocker per vascular patient can be restarted on eliquis. Consulting pharmacy to assist with transitioning.   DVT prophylaxis: on Heparin Code Status: DNR Family Communication: none at bedside Disposition Plan: Once cleared for discharge by vascular   Consultants:   Vascular surgeon   Procedures:  POD # 2  1. US guided cannulation of left common femoral artery 2. Aortogram with bilateral lower extremity runoff 3. Jetstream atherectomy of right SFA and popliteal arteries 4. Drug coated balloon angioplasty of Right sfa and popliteal arteries with 64m impact  Antimicrobials: zosyn, vancomycin  Subjective: Pt has no new complaints and denies any bleeding  Objective: Vitals:   04/30/16 1245 04/30/16 2058 05/01/16 0454 05/01/16 0830  BP: 115/68 (!) 109/53 115/73 139/77  Pulse: 75 67 75 91  Resp: 18 15 15 18   Temp: 98.5 F (36.9 C) 97.6 F (36.4 C) 97.5 F (36.4 C) 98.1 F (36.7 C)  TempSrc: Oral Oral Oral Oral  SpO2: 96% 98% 98% 97%  Weight:      Height:        Intake/Output Summary (Last 24 hours) at 05/01/16 0919 Last data filed at 04/30/16 1600  Gross per 24 hour  Intake           476.17 ml  Output                0 ml  Net           476.17 ml   Filed Weights   04/28/16 0255  Weight: 105.8 kg (233 lb 4 oz)    Examination:  General exam: Appears calm and comfortable, in  nad Respiratory system: Clear to auscultation. Respiratory effort normal. Cardiovascular system: S1 & S2 heard, No JVD, murmurs, rubs,  Gastrointestinal system: Abdomen is nondistended, soft and nontender. No organomegaly or masses felt. Normal bowel sounds heard. Central nervous system: Alert and oriented. No focal neurological deficits. Extremities: gangrene changes at RLE Skin: No rashes, lesions or ulcers, see above Psychiatry: Judgement and insight appear normal. Mood & affect appropriate.   Data Reviewed: I have personally reviewed following labs and imaging  studies  CBC:  Recent Labs Lab 04/27/16 1943 04/28/16 2242 04/29/16 0310 04/30/16 0748 05/01/16 0338  WBC 6.7 5.1 5.4 7.3 7.6  NEUTROABS 4.7  --   --   --   --   HGB 12.8 11.3* 11.7* 11.8* 10.8*  HCT 37.0 34.6* 36.9 37.1 33.2*  MCV 94.9 99.1 100.0 100.8* 99.1  PLT SPECIMEN CHECKED FOR CLOTS 91* 93* 100* 542*   Basic Metabolic Panel:  Recent Labs Lab 04/27/16 1943 04/28/16 2242 04/29/16 0310 04/30/16 0748 05/01/16 0338  NA 137 133* 138  --   --   K 3.6 4.2 4.3  --   --   CL 102 97* 104  --   --   CO2 26 25 26   --   --   GLUCOSE 176* 180* 122*  --   --   BUN 27* 25* 24*  --   --   CREATININE 0.80 1.11* 1.06* 1.18* 1.30*  CALCIUM 9.5 9.1 9.2  --   --    GFR: Estimated Creatinine Clearance: 48 mL/min (by C-G formula based on SCr of 1.3 mg/dL (H)). Liver Function Tests: No results for input(s): AST, ALT, ALKPHOS, BILITOT, PROT, ALBUMIN in the last 168 hours. No results for input(s): LIPASE, AMYLASE in the last 168 hours. No results for input(s): AMMONIA in the last 168 hours. Coagulation Profile:  Recent Labs Lab 04/29/16 0310  INR 1.13   Cardiac Enzymes: No results for input(s): CKTOTAL, CKMB, CKMBINDEX, TROPONINI in the last 168 hours. BNP (last 3 results)  Recent Labs  02/10/16 1219  PROBNP 60.0   HbA1C: No results for input(s): HGBA1C in the last 72 hours. CBG:  Recent Labs Lab 04/30/16 0557 04/30/16 1222 04/30/16 1607 04/30/16 2056 05/01/16 0543  GLUCAP 181* 255* 136* 173* 176*   Lipid Profile: No results for input(s): CHOL, HDL, LDLCALC, TRIG, CHOLHDL, LDLDIRECT in the last 72 hours. Thyroid Function Tests: No results for input(s): TSH, T4TOTAL, FREET4, T3FREE, THYROIDAB in the last 72 hours. Anemia Panel: No results for input(s): VITAMINB12, FOLATE, FERRITIN, TIBC, IRON, RETICCTPCT in the last 72 hours. Sepsis Labs: No results for input(s): PROCALCITON, LATICACIDVEN in the last 168 hours.  Recent Results (from the past 240 hour(s))   Blood Cultures x 2 sites     Status: None (Preliminary result)   Collection Time: 04/27/16  7:42 PM  Result Value Ref Range Status   Specimen Description BLOOD LEFT ANTECUBITAL  Final   Special Requests BOTTLES DRAWN AEROBIC AND ANAEROBIC 5ML EA  Final   Culture   Final    NO GROWTH 3 DAYS Performed at [facility], [address], [place], [place] [address]    Report Status PENDING  Incomplete  Blood Cultures x 2 sites     Status: None (Preliminary result)   Collection Time: 04/27/16  8:10 PM  Result Value Ref Range Status   Specimen Description BLOOD BLOOD LEFT HAND  Final   Special Requests BOTTLES DRAWN AEROBIC AND ANAEROBIC 5 CC EA  Final  Culture   Final    NO GROWTH 3 DAYS Performed at [facility], [place] [address], [place], [place] [address]    Report Status PENDING  Incomplete         Radiology Studies: No results found.  Scheduled Meds: . allopurinol  100 mg Oral QHS  . clopidogrel  75 mg Oral Daily  . DULoxetine  30 mg Oral QHS  . fluticasone  1 spray Each Nare QHS  . insulin aspart  0-9 Units Subcutaneous TID WC  . insulin pump   Subcutaneous TID AC, HS, 0200  . levothyroxine  175 mcg Oral QAC breakfast  . metoprolol succinate  25 mg Oral Daily  . piperacillin-tazobactam (ZOSYN)  IV  3.375 g Intravenous Q8H  . prenatal vitamin w/FE, FA  1 tablet Oral Daily  . ramipril  2.5 mg Oral Daily  . torsemide  10 mg Oral Daily  . vancomycin  750 mg Intravenous Q12H   Continuous Infusions: . sodium chloride 75 mL/hr at 05/01/16 0832  . heparin 1,450 Units/hr (05/01/16 0158)     LOS: 4 days    Time spent: > 35 minutes  Velvet Bathe, MD [facility] Pager [phone]  If 7PM-7AM, please contact night-coverage [email or web address] Password [facility] 05/01/2016, 9:19 AM

## 2016-05-02 LAB — CULTURE, BLOOD (ROUTINE X 2)
Culture: NO GROWTH
Culture: NO GROWTH

## 2016-05-02 LAB — CREATININE, SERUM
Creatinine, Ser: 1.92 mg/dL — ABNORMAL HIGH (ref 0.44–1.00)
GFR calc Af Amer: 30 mL/min — ABNORMAL LOW (ref >60)
GFR calc non Af Amer: 26 mL/min — ABNORMAL LOW (ref >60)

## 2016-05-02 LAB — CBC
HEMATOCRIT: 33.6 % — AB (ref 36.0–46.0)
HEMOGLOBIN: 11 g/dL — AB (ref 12.0–15.0)
MCH: 32.2 pg (ref 26.0–34.0)
MCHC: 32.7 g/dL (ref 30.0–36.0)
MCV: 98.2 fL (ref 78.0–100.0)
Platelets: 110 10*3/uL — ABNORMAL LOW (ref 150–400)
RBC: 3.42 MIL/uL — ABNORMAL LOW (ref 3.87–5.11)
RDW: 14.1 % (ref 11.5–15.5)
WBC: 8 10*3/uL (ref 4.0–10.5)

## 2016-05-02 LAB — GLUCOSE, CAPILLARY
GLUCOSE-CAPILLARY: 191 mg/dL — AB (ref 65–99)
Glucose-Capillary: 166 mg/dL — ABNORMAL HIGH (ref 65–99)
Glucose-Capillary: 90 mg/dL (ref 65–99)

## 2016-05-02 MED ORDER — TORSEMIDE 10 MG PO TABS
10.0000 mg | ORAL_TABLET | Freq: Every day | ORAL | Status: DC
Start: 2016-05-05 — End: 2016-08-06

## 2016-05-02 MED ORDER — AMOXICILLIN-POT CLAVULANATE 500-125 MG PO TABS
1.0000 | ORAL_TABLET | Freq: Two times a day (BID) | ORAL | Status: DC
  Administered 2016-05-02: 500 mg via ORAL
  Filled 2016-05-02 (×2): qty 1

## 2016-05-02 MED ORDER — CLOPIDOGREL BISULFATE 75 MG PO TABS: 75.0000 mg | ORAL_TABLET | Freq: Every day | ORAL | 0 refills | Status: DC

## 2016-05-02 MED ORDER — DULOXETINE HCL 30 MG PO CPEP: 30.0000 mg | ORAL_CAPSULE | Freq: Every day | ORAL | 0 refills | Status: DC

## 2016-05-02 MED ORDER — AMOXICILLIN-POT CLAVULANATE 500-125 MG PO TABS: 1.0000 | ORAL_TABLET | Freq: Two times a day (BID) | ORAL | 0 refills | Status: AC

## 2016-05-02 MED ORDER — OXYCODONE-ACETAMINOPHEN 5-325 MG PO TABS: 1.0000 | ORAL_TABLET | Freq: Four times a day (QID) | ORAL | 0 refills | Status: DC | PRN

## 2016-05-02 MED ORDER — ACETAMINOPHEN 325 MG PO TABS: 650.0000 mg | ORAL_TABLET | ORAL | Status: DC | PRN

## 2016-05-02 NOTE — Care Management Note (Signed)
Case Management Note  Patient Details  Name: TARRIE MCMICHEN MRN: [id number] Date of Birth: 1950/05/12  Subjective/Objective:                  Gangrene of foot [facility]) Action/Plan: Discharge planning Expected Discharge Date:  05/02/16               Expected Discharge Plan:  [place]  In-House Referral:     Discharge planning Services  CM Consult  Post Acute Care Choice:  Home Health Choice offered to:  Patient  DME Arranged:  3-N-1, Wheelchair manual DME Agency:  [place]mire:  PT [facility] Agency:  [place]  Status of Service:  Completed, signed off  If discussed at [place] of Stay Meetings, dates discussed:    Additional Comments: CM received call from RN for DME and set up with [facility] for HHPT.  Orders and face to face requested.  Referral called to [facility] rep, Jermaine and DME rep, Reggie to please deliver the 3n1 and wheelchair to room so pt can discharge.  No other CM needs were communicated.  Dellie Catholic, RN 05/02/2016, 4:14 PM

## 2016-05-02 NOTE — Progress Notes (Signed)
[facility]  Subjective  - feels ok foot hurts some   Objective (!) 142/62 (!) 58 97.5 F (36.4 C) (Oral) 18 99%  Intake/Output Summary (Last 24 hours) at 05/02/16 1330 Last data filed at 05/01/16 1600  Gross per 24 hour  Intake              200 ml  Output                0 ml  Net              200 ml   Toes drying out, foot warm  Assessment/Planning: Ok for d/c from my standpoint. Dry dressing foot with dry gauze between toes.  Follow up Scot Dock 2 weeks Rx for percocet #15 which should last til follow up with Dr Scot Dock Creatinine 1.9 today will leave at discretion of primary team  Ruta Hinds 05/02/2016 1:30 PM --  Laboratory Lab Results:  Recent Labs  05/01/16 0338 05/02/16 0301  WBC 7.6 8.0  HGB 10.8* 11.0*  HCT 33.2* 33.6*  PLT 106* 110*   BMET  Recent Labs  05/01/16 0338 05/02/16 0301  CREATININE 1.30* 1.92*    COAG Lab Results  Component Value Date   INR 1.13 04/29/2016   No results found for: PTT

## 2016-05-02 NOTE — Discharge Summary (Signed)
Physician Discharge Summary  Alexis [name] ZES:[id number] DOB: September 28, 1950 DOA: 04/27/2016  PCP: Mackie Pai, PA-C  Admit date: 04/27/2016 Discharge date: 05/02/2016  Time spent: > 35 minutes  Recommendations for Outpatient Follow-up:  1. F/u with serum creatinine 2. Decide when to continue ramipril 3. Pt will be discharged on 5 more days of antibiotics to complete a total of 10 days of treatment. Decide whether or not to prolong this   Discharge Diagnoses:  Principal Problem:   Gangrene of foot ([place]) Active Problems:   Diabetes ([place])   HTN (hypertension)   Hypothyroidism   Hyperlipidemia   Thrombocytopenia ([facility])   Persistent atrial fibrillation ([place])   Discharge Condition: stable  Diet recommendation: heart healthy/carb modified diet  Filed Weights   04/28/16 0255  Weight: 105.8 kg (233 lb 4 oz)    History of present illness:  66 y/o that presented with left foot with gangrenous eschar on the medial aspect of her left great toe and small gangrenous area on her right fifth toe. Was admitted and evaluated by vascular surgery  Hospital Course:  Principal Problem:   Gangrene of foot [facility]) - Vascular surgeon managing. Patient is s/p arthrectomy and PTA of right SFA and popliteal arteries. - d/c on augmentin for 5 more days to complete a 10 day treatment course  Active Problems:   Diabetes ([place]) - Carb modified diet. - pt to continue prior to admission medication regimen    HTN (hypertension) - pt is currently on metoprolol, ramipril held due to elevation in serum creatinine  AKI - discontinue ramipril on d/c. Advised against nsaids when patient goes home. Held torsemide for a few days. Also discontinued vancomycin. I suspect patient's Serum creatinine will improve with this game plan. I recommend she f/u with her primary care physician for further evaluation and recommendations.     Hypothyroidism - continue synthroid    Hyperlipidemia   Thrombocytopenia  ([place]) - improved on last check.    Persistent atrial fibrillation ([place]) - continue b blocker and eliquis.  Procedures: arthrectomy and PTA of right SFA and popliteal arteries.  Consultations:  Vascular surgery: Dr. Scot Dock  Discharge Exam: Vitals:   05/01/16 2100 05/02/16 0514  BP: (!) 117/53 (!) 142/62  Pulse: 67 (!) 58  Resp: 18 18  Temp: 97.6 F (36.4 C) 97.5 F (36.4 C)    General: Pt in nad, alert and awake Cardiovascular: rrr, no rubs Respiratory: no increased wob, no wheezes  Discharge Instructions   Discharge Instructions    Call MD for:  extreme fatigue    Complete by:  As directed    Call MD for:  severe uncontrolled pain    Complete by:  As directed    Call MD for:  temperature >100.4    Complete by:  As directed    Diet - low sodium heart healthy    Complete by:  As directed    Discharge instructions    Complete by:  As directed    Please follow up with your primary care physician in 1 week to reassess your serum creatinine and to see if he can restart your ramipril or hold your torsemide longer.  Also follow up with the surgeons that worked on you while you were in house. Call their office to confirm appointment date and time.   Increase activity slowly    Complete by:  As directed      Current Discharge Medication List    START taking these medications   Details  acetaminophen (TYLENOL) 325 MG tablet Take 2 tablets (650 mg total) by mouth every 4 (four) hours as needed for headache or mild pain.    amoxicillin-clavulanate (AUGMENTIN) 500-125 MG tablet Take 1 tablet (500 mg total) by mouth 2 (two) times daily. Qty: 10 tablet, Refills: 0    clopidogrel (PLAVIX) 75 MG tablet Take 1 tablet (75 mg total) by mouth daily. Qty: 30 tablet, Refills: 0      CONTINUE these medications which have CHANGED   Details  DULoxetine (CYMBALTA) 30 MG capsule Take 1 capsule (30 mg total) by mouth at bedtime. Qty: 30 capsule, Refills: 0    torsemide (DEMADEX)  10 MG tablet Take 1 tablet (10 mg total) by mouth daily.      CONTINUE these medications which have NOT CHANGED   Details  albuterol (PROVENTIL HFA;VENTOLIN HFA) 108 (90 Base) MCG/ACT inhaler Inhale 2 puffs into the lungs every 6 (six) hours as needed for wheezing or shortness of breath. Qty: 1 Inhaler, Refills: 2    albuterol (PROVENTIL) (2.5 MG/3ML) 0.083% nebulizer solution Take 3 mLs (2.5 mg total) by nebulization every 6 (six) hours as needed for wheezing or shortness of breath. Qty: 150 mL, Refills: 1    allopurinol (ZYLOPRIM) 100 MG tablet Take ONE (1) tablet by mouth once daily Qty: 90 tablet, Refills: 3    apixaban (ELIQUIS) 5 MG TABS tablet Take 1 tablet (5 mg total) by mouth 2 (two) times daily. Qty: 60 tablet, Refills: 6    fluticasone (FLONASE) 50 MCG/ACT nasal spray Place 1 spray into both nostrils daily. Qty: 16 g, Refills: 3    fluticasone (FLOVENT HFA) 110 MCG/ACT inhaler Inhale 2 puffs into the lungs 2 (two) times daily. Qty: 1 Inhaler, Refills: 3    Insulin Disposable Pump (V-GO 40) KIT Inject into the skin See admin instructions. Use with Novolog - 6 pumps before each meal   Associated Diagnoses: Encounter for immunization    levothyroxine (SYNTHROID, LEVOTHROID) 175 MCG tablet Take 175 mcg by mouth daily before breakfast.    metoprolol succinate (TOPROL-XL) 25 MG 24 hr tablet Take 1 tablet by mouth daily.    Prenatal Vit-Fe Fumarate-FA (MULTIVITAMIN-PRENATAL) 27-0.8 MG TABS tablet Take 1 tablet by mouth daily. Takes for nail and hair growth     Blood Glucose Monitoring Suppl (GLUCOCOM BLOOD GLUCOSE MONITOR) DEVI Use to check blood sugars 6 times daily E11.65    Insulin Pen Needle (B-D ULTRAFINE III SHORT PEN) 31G X 8 MM MISC Use for insulin pen up to five times daily E11.65      STOP taking these medications     naproxen sodium (ALEVE) 220 MG tablet      ramipril (ALTACE) 2.5 MG capsule      atorvastatin (LIPITOR) 10 MG tablet      azelastine  (ASTELIN) 0.1 % nasal spray      spironolactone (ALDACTONE) 25 MG tablet        Allergies  Allergen Reactions  . Indomethacin Other (See Comments)     Renal Insufficiency  . Pregabalin Other (See Comments)    DIZZINESS   . Sulfa Antibiotics Hives      The results of significant diagnostics from this hospitalization (including imaging, microbiology, ancillary and laboratory) are listed below for reference.    Significant Diagnostic Studies: Dg Chest 2 View  Result Date: 04/07/2016 CLINICAL DATA:  Clinical improvement since getting the heart rate under control. History of CHF, COPD, atrial fibrillation. EXAM: CHEST  2 VIEW COMPARISON:  PA  and lateral chest x-ray of February 15, 2016 FINDINGS: The lungs are adequately inflated. There is no focal infiltrate. There is chronic prominence of the perihilar soft tissues on the right. There is no pleural effusion. The cardiac silhouette is enlarged but stable. The pulmonary vascularity is not engorged. The mediastinum is normal in width. There is calcification in the wall of the aortic arch. The ICD is in stable position. There is mild degenerative disc disease of the thoracic spine. IMPRESSION: Chronic bronchitic changes. Mild perihilar interstitial prominence, stable. Stable cardiomegaly. Electronically Signed   By: David  Martinique M.D.   On: 04/07/2016 15:18   Ct Angio Low Extrem Right W &/or Wo Contrast  Result Date: 04/27/2016 CLINICAL DATA:  Right lower extremity gangrene and pain. EXAM: CT ANGIOGRAPHY OF THE right lowerEXTREMITY TECHNIQUE: Multidetector CT imaging of the right lowerwas performed using the standard protocol during bolus administration of intravenous contrast. Multiplanar CT image reconstructions and MIPs were obtained to evaluate the vascular anatomy. CONTRAST:  100 mL of Isovue 370 intravenously. COMPARISON:  None. FINDINGS: Extensive atherosclerosis is seen throughout the visualized arterial system of the right lower  extremity. Moderate to severe multifocal stenoses are noted throughout the visualized common femoral and superficial femoral artery as a result. There is complete occlusion of the distal right superficial femoral artery concerning for thrombus or acute embolus. Reconstitution of the right popliteal artery is noted, which also demonstrates multifocal stenoses secondary to atheromatous disease. Three vessel runoff is noted in the right calf to the ankle mortise. No osseous abnormality is noted. Review of the MIP images confirms the above findings. IMPRESSION: Extensive atheromatous disease is seen throughout the arterial circulation of right lower extremity. Moderate to severe multifocal stenoses are noted throughout the visualized right common femoral and superficial femoral arteries and popliteal artery as a result. Acute thrombus or embolus is noted in the distal right superficial femoral artery, resulting in complete occlusion of the vessel at this level. Reconstitution of the right popliteal artery is noted, with 3 vessel runoff noted in the right calf to the ankle mortise. Critical Value/emergent results were called by telephone at the time of interpretation on 04/27/2016 at 10:02 pm to Dr. Margarita Mail , who verbally acknowledged these results. Electronically Signed   By: Marijo Conception, M.D.   On: 04/27/2016 22:03   Dg Foot 2 Views Right  Result Date: 04/27/2016 CLINICAL DATA:  Right foot infection.  Pain with foot wounds. EXAM: RIGHT FOOT - 2 VIEW COMPARISON:  Foot radiographs 07/12/2014 FINDINGS: There is no evidence of fracture or dislocation. No bony destructive change or abnormal density to suggest osteomyelitis. Plantar calcaneal spur. Mild osteoarthritis of the mid/hindfoot. Soft tissue edema suspected about the metatarsal phalangeal joints and digits. No evidence of soft tissue air or radiopaque foreign body. IMPRESSION: Soft tissue edema about the digits. No radiographic evidence of  osteomyelitis. No radiopaque foreign body. Electronically Signed   By: Jeb Levering M.D.   On: 04/27/2016 19:30   US Abdomen Limited Ruq  Result Date: 04/05/2016 CLINICAL DATA:  66 year old female with right upper quadrant abdominal pain, nausea vomiting and diarrhea for 1 day. Hyperglycemia. Initial encounter. EXAM: US ABDOMEN LIMITED - RIGHT UPPER QUADRANT COMPARISON:  Right upper quadrant ultrasound 05/21/2014 and [facility] CT Abdomen and Pelvis 03/13/2010 FINDINGS: Gallbladder: Chronic cholelithiasis. Superimposed sludge. Today individual stones are estimated up to 1.9 cm. Gallbladder wall thickness remains normal at 2 mm. No sonographic Murphy sign elicited. No pericholecystic fluid. Common bile  duct: Diameter: 2 mm, normal Liver: Chronically echogenic (image 36). Dense parenchyma somewhat difficult penetrate. No intrahepatic biliary ductal dilatation or discrete liver lesion identified. IMPRESSION: Chronic cholelithiasis and fatty liver disease. No evidence of acute cholecystitis or acute biliary obstruction. Electronically Signed   By: Genevie Ann M.D.   On: 04/05/2016 11:06    Microbiology: Recent Results (from the past 240 hour(s))  Blood Cultures x 2 sites     Status: None (Preliminary result)   Collection Time: 04/27/16  7:42 PM  Result Value Ref Range Status   Specimen Description BLOOD LEFT ANTECUBITAL  Final   Special Requests BOTTLES DRAWN AEROBIC AND ANAEROBIC 5ML EA  Final   Culture   Final    NO GROWTH 4 DAYS Performed at [facility], [address] [place], [place] [address]    Report Status PENDING  Incomplete  Blood Cultures x 2 sites     Status: None (Preliminary result)   Collection Time: 04/27/16  8:10 PM  Result Value Ref Range Status   Specimen Description BLOOD BLOOD LEFT HAND  Final   Special Requests BOTTLES DRAWN AEROBIC AND ANAEROBIC 5 CC EA  Final   Culture   Final    NO GROWTH 4 DAYS Performed at [facility], [place] [address], [place], [place] [address]    Report Status PENDING  Incomplete     Labs: Basic Metabolic Panel:  Recent Labs Lab 04/27/16 1943 04/28/16 2242 04/29/16 0310 04/30/16 0748 05/01/16 0338 05/02/16 0301  NA 137 133* 138  --   --   --   K 3.6 4.2 4.3  --   --   --   CL 102 97* 104  --   --   --   CO2 26 25 26   --   --   --   GLUCOSE 176* 180* 122*  --   --   --   BUN 27* 25* 24*  --   --   --   CREATININE 0.80 1.11* 1.06* 1.18* 1.30* 1.92*  CALCIUM 9.5 9.1 9.2  --   --   --    Liver Function Tests: No results for input(s): AST, ALT, ALKPHOS, BILITOT, PROT, ALBUMIN in the last 168 hours. No results for input(s): LIPASE, AMYLASE in the last 168 hours. No results for input(s): AMMONIA in the last 168 hours. CBC:  Recent Labs Lab 04/27/16 1943 04/28/16 2242 04/29/16 0310 04/30/16 0748 05/01/16 0338 05/02/16 0301  WBC 6.7 5.1 5.4 7.3 7.6 8.0  NEUTROABS 4.7  --   --   --   --   --   HGB 12.8 11.3* 11.7* 11.8* 10.8* 11.0*  HCT 37.0 34.6* 36.9 37.1 33.2* 33.6*  MCV 94.9 99.1 100.0 100.8* 99.1 98.2  PLT SPECIMEN CHECKED FOR CLOTS 91* 93* 100* 106* 110*   Cardiac Enzymes: No results for input(s): CKTOTAL, CKMB, CKMBINDEX, TROPONINI in the last 168 hours. BNP: BNP (last 3 results)  Recent Labs  01/27/16 2025 02/15/16 1340 04/06/16 1625  BNP 40.1 58.1 75.9    ProBNP (last 3 results)  Recent Labs  02/10/16 1219  PROBNP 60.0    CBG:  Recent Labs Lab 05/01/16 1113 05/01/16 1701 05/01/16 1742 05/01/16 2052 05/02/16 0515  GLUCAP 156* 173* 149* 141* 166*    Signed:  Velvet Bathe MD.  [facility] 05/02/2016, 9:32 AM

## 2016-05-03 ENCOUNTER — Telehealth: Payer: Self-pay | Admitting: Behavioral Health

## 2016-05-03 ENCOUNTER — Encounter: Payer: Self-pay | Admitting: Medical

## 2016-05-03 ENCOUNTER — Telehealth: Payer: Self-pay | Admitting: Vascular Surgery

## 2016-05-03 NOTE — Telephone Encounter (Signed)
Will you call pt and ask who ws surgeon she saw in hospital. She is supposed to follow up with surgeon. I want to go ahead and get that in place. She will follow up with me in one week.

## 2016-05-03 NOTE — Telephone Encounter (Signed)
-----   Message from Mena Goes, RN sent at 05/02/2016  8:55 PM EST ----- Regarding: 2-3 weeks w/ Scot Dock   ----- Message ----- From: Elam Dutch, MD Sent: 05/02/2016   1:33 PM To: Vvs Charge Pool  Follow up Scot Dock in 2-3 weeks  Ruta Hinds

## 2016-05-03 NOTE — Telephone Encounter (Signed)
Attempted to reach patient for TCM/Hospital Follow-up call. Left message for patient to return call when available.    

## 2016-05-03 NOTE — Telephone Encounter (Signed)
LVM on home # for appt 3/28, mailed lttr

## 2016-05-04 ENCOUNTER — Encounter (HOSPITAL_BASED_OUTPATIENT_CLINIC_OR_DEPARTMENT_OTHER): Payer: Medicare HMO | Attending: Surgery

## 2016-05-04 ENCOUNTER — Telehealth: Payer: Self-pay | Admitting: Medical

## 2016-05-04 ENCOUNTER — Ambulatory Visit: Payer: Medicare HMO | Admitting: Internal Medicine

## 2016-05-04 DIAGNOSIS — L97513 Non-pressure chronic ulcer of other part of right foot with necrosis of muscle: Secondary | ICD-10-CM | POA: Diagnosis not present

## 2016-05-04 DIAGNOSIS — I252 Old myocardial infarction: Secondary | ICD-10-CM | POA: Diagnosis not present

## 2016-05-04 DIAGNOSIS — E785 Hyperlipidemia, unspecified: Secondary | ICD-10-CM | POA: Diagnosis not present

## 2016-05-04 DIAGNOSIS — I4891 Unspecified atrial fibrillation: Secondary | ICD-10-CM | POA: Diagnosis not present

## 2016-05-04 DIAGNOSIS — E11621 Type 2 diabetes mellitus with foot ulcer: Secondary | ICD-10-CM | POA: Diagnosis not present

## 2016-05-04 DIAGNOSIS — J449 Chronic obstructive pulmonary disease, unspecified: Secondary | ICD-10-CM | POA: Diagnosis not present

## 2016-05-04 DIAGNOSIS — I10 Essential (primary) hypertension: Secondary | ICD-10-CM | POA: Diagnosis not present

## 2016-05-04 DIAGNOSIS — I739 Peripheral vascular disease, unspecified: Secondary | ICD-10-CM

## 2016-05-04 DIAGNOSIS — E1152 Type 2 diabetes mellitus with diabetic peripheral angiopathy with gangrene: Secondary | ICD-10-CM | POA: Diagnosis not present

## 2016-05-04 DIAGNOSIS — L97413 Non-pressure chronic ulcer of right heel and midfoot with necrosis of muscle: Secondary | ICD-10-CM | POA: Diagnosis not present

## 2016-05-04 DIAGNOSIS — I96 Gangrene, not elsewhere classified: Secondary | ICD-10-CM

## 2016-05-04 NOTE — Telephone Encounter (Addendum)
Patient had drug coated balloon angioplasty of Right sfa and popliteal arteries on 04/29/16 w/ Dr. Servando Snare.  She has appt w/ wound care tomorrow morning at 8:45am.  She currently already has home health. HH PT placed the original call requesting nursing (see below).  Please advise if you would still like to refer to vascular surgery.

## 2016-05-04 NOTE — Telephone Encounter (Signed)
Please advise 

## 2016-05-04 NOTE — Telephone Encounter (Signed)
The surgeon name was [name] MD. It appears that maybe the surgery was cancelled. Can they see her tomorrow. Verify with pt whether had surgery or not. Will vascular see her even if no surgery done as may need surgery now. I am concerned about her toe. To schedule home health usually need to verify face to face visit for problems for which she is being seen. I saw her feet but no necrosis at that time. If vascular won't see her tomorrow then can she be scheduled with me tomorrow but need 30 minute slot. But I have feeling appointment with me would just be delay. Needs to see vascular.

## 2016-05-04 NOTE — Telephone Encounter (Signed)
I would recommend stat referral to the vascular surgeon who did the procedure. Have them see her asap tomorrow. Home health is a process. In my opinion immediate aftercare follow up needs to be done by the actual surgeon. Please get name of surgeon from pt. I will put in the referral. Please notify jennifer referal is being written.

## 2016-05-04 NOTE — Telephone Encounter (Signed)
Left message x 2 for TCM/Hospital Follow-up call.

## 2016-05-04 NOTE — Telephone Encounter (Signed)
Referral placed in [facility] work que, awaiting appt

## 2016-05-04 NOTE — Telephone Encounter (Signed)
If they will accept verbal order for home health nurse from me today that is fine. They could assess her today. But this would not be a replacement for wound care appointment tomorrow please explain this to patient. So keep appointment tomorrow with wound care regardless.

## 2016-05-04 NOTE — Telephone Encounter (Signed)
I was not aware that pt has wound care appointment tomorrow. Will see what they say and after their evaluation may need the vascular appointment. Thanks.

## 2016-05-04 NOTE — Telephone Encounter (Signed)
Caller name: Claiborne Billings  Relation to pt: PT from [facility]  Call back number: [phone]    Reason for call:  PT states patient was discharged from hospital Sunday. Necrosis of the right toe, the wound site doesn't look good requesting verbal orders for home health nurse to come out today, please advise

## 2016-05-05 ENCOUNTER — Telehealth: Payer: Self-pay

## 2016-05-05 DIAGNOSIS — I4891 Unspecified atrial fibrillation: Secondary | ICD-10-CM | POA: Diagnosis not present

## 2016-05-05 DIAGNOSIS — L97413 Non-pressure chronic ulcer of right heel and midfoot with necrosis of muscle: Secondary | ICD-10-CM | POA: Diagnosis not present

## 2016-05-05 DIAGNOSIS — J449 Chronic obstructive pulmonary disease, unspecified: Secondary | ICD-10-CM | POA: Diagnosis not present

## 2016-05-05 DIAGNOSIS — I252 Old myocardial infarction: Secondary | ICD-10-CM | POA: Diagnosis not present

## 2016-05-05 DIAGNOSIS — E785 Hyperlipidemia, unspecified: Secondary | ICD-10-CM | POA: Diagnosis not present

## 2016-05-05 DIAGNOSIS — E11621 Type 2 diabetes mellitus with foot ulcer: Secondary | ICD-10-CM | POA: Diagnosis not present

## 2016-05-05 DIAGNOSIS — L97513 Non-pressure chronic ulcer of other part of right foot with necrosis of muscle: Secondary | ICD-10-CM | POA: Diagnosis not present

## 2016-05-05 DIAGNOSIS — I10 Essential (primary) hypertension: Secondary | ICD-10-CM | POA: Diagnosis not present

## 2016-05-05 DIAGNOSIS — E1152 Type 2 diabetes mellitus with diabetic peripheral angiopathy with gangrene: Secondary | ICD-10-CM | POA: Diagnosis not present

## 2016-05-05 NOTE — Telephone Encounter (Signed)
See telephone note 05/04/16

## 2016-05-05 NOTE — Telephone Encounter (Signed)
Rec'd phone call from [facility] RN with [employer].  Reported pt. having bleeding from all the toes of right foot.  Stated the dry gauze dressing is sticking to the toes, and the eschar is peeling off, with dressing changes.  Stated there is maceration at the edges of the wounds of right foot toes.  Requested wound care orders.  Stated the drainage is bloody, without purulence or odor.  Reported no fever/ chills.  Also reported the pt. Is on Plavix and Eliquis, and has noted a lot of bleeding with dressing changes.  Offered appt. Today.  Pt. Unable to come to office today.  Appt. given at 9:00 AM 3/8; nurse stated pt. will come to that appt.

## 2016-05-05 NOTE — Telephone Encounter (Signed)
Done

## 2016-05-06 ENCOUNTER — Ambulatory Visit (INDEPENDENT_AMBULATORY_CARE_PROVIDER_SITE_OTHER): Payer: Self-pay | Admitting: Vascular Surgery

## 2016-05-06 ENCOUNTER — Ambulatory Visit: Payer: Medicare HMO | Admitting: Cardiovascular Disease

## 2016-05-06 ENCOUNTER — Telehealth: Payer: Self-pay | Admitting: *Deleted

## 2016-05-06 ENCOUNTER — Encounter: Payer: Self-pay | Admitting: Vascular Surgery

## 2016-05-06 VITALS — BP 135/61 | HR 80 | Temp 97.6°F | Resp 20 | Ht 62.0 in | Wt 233.0 lb

## 2016-05-06 DIAGNOSIS — Z48812 Encounter for surgical aftercare following surgery on the circulatory system: Secondary | ICD-10-CM

## 2016-05-06 MED ORDER — OXYCODONE-ACETAMINOPHEN 5-325 MG PO TABS: 1.0000 | ORAL_TABLET | Freq: Four times a day (QID) | ORAL | 0 refills | Status: DC | PRN

## 2016-05-06 NOTE — Telephone Encounter (Signed)
Patient was seen in the office today by Dr Scot Dock.  He ordered home health to do daily dressing changes to wound.  Wash/soak foot in warm water with dial soap.  Pat dry.  Hydrogel between toes, 2x2's, kerlix and ace wrap.  Heather voiced understanding of the new orders and states that she will instruct [name] nurse for visit tomorrow, Friday,05/07/2016.

## 2016-05-06 NOTE — Progress Notes (Signed)
Patient name: Alexis [name] MRN: [id number] DOB: April 13, 1950 Sex: female  REASON FOR VISIT: Follow up of right foot wounds.  HPI: Alexis [name] is a 66 y.o. 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 She had multiple gangrenous toes. On 04/29/2016 she underwent atherectomy of her right superficial femoral artery and popliteal artery with drug coated balloon angioplasty of both arteries. She had an excellent result. There was peroneal runoff on the right. She comes in today to have her foot checked.  A [place], she has not been able to soak her foot daily in lukewarm dye also soaks because she has well water which has lime in it and made the toes worse.   She does have some pain at the heel.  Current Outpatient Prescriptions  Medication Sig Dispense Refill  . acetaminophen (TYLENOL) 325 MG tablet Take 2 tablets (650 mg total) by mouth every 4 (four) hours as needed for headache or mild pain.    Marland Kitchen albuterol (PROVENTIL HFA;VENTOLIN HFA) 108 (90 Base) MCG/ACT inhaler Inhale 2 puffs into the lungs every 6 (six) hours as needed for wheezing or shortness of breath. 1 Inhaler 2  . albuterol (PROVENTIL) (2.5 MG/3ML) 0.083% nebulizer solution Take 3 mLs (2.5 mg total) by nebulization every 6 (six) hours as needed for wheezing or shortness of breath. 150 mL 1  . allopurinol (ZYLOPRIM) 100 MG tablet Take ONE (1) tablet by mouth once daily (Patient taking differently: Take 100 mg by mouth at bedtime. ) 90 tablet 3  . apixaban (ELIQUIS) 5 MG TABS tablet Take 1 tablet (5 mg total) by mouth 2 (two) times daily. 60 tablet 6  . Blood Glucose Monitoring Suppl (GLUCOCOM BLOOD GLUCOSE MONITOR) DEVI Use to check blood sugars 6 times daily E11.65    . clopidogrel (PLAVIX) 75 MG tablet Take 1 tablet (75 mg total) by mouth daily. 30 tablet 0  . DULoxetine (CYMBALTA) 30 MG capsule Take 1 capsule (30 mg total) by mouth at bedtime. 30 capsule 0  . fluticasone (FLONASE) 50 MCG/ACT nasal  spray Place 1 spray into both nostrils daily. (Patient taking differently: Place 1 spray into both nostrils at bedtime. Place 1 spray into both nostrils daily.) 16 g 3  . fluticasone (FLOVENT HFA) 110 MCG/ACT inhaler Inhale 2 puffs into the lungs 2 (two) times daily. (Patient taking differently: Inhale 2 puffs into the lungs as needed (bronchitis). ) 1 Inhaler 3  . Insulin Disposable Pump (V-GO 40) KIT Inject into the skin See admin instructions. Use with Novolog - 6 pumps before each meal    . Insulin Pen Needle (B-D ULTRAFINE III SHORT PEN) 31G X 8 MM MISC Use for insulin pen up to five times daily E11.65    . levothyroxine (SYNTHROID, LEVOTHROID) 175 MCG tablet Take 175 mcg by mouth daily before breakfast.    . metoprolol succinate (TOPROL-XL) 25 MG 24 hr tablet Take 1 tablet by mouth daily.    Marland Kitchen oxyCODONE-acetaminophen (PERCOCET/ROXICET) 5-325 MG tablet Take 1-2 tablets by mouth every 6 (six) hours as needed for severe pain. 15 tablet 0  . Prenatal Vit-Fe Fumarate-FA (MULTIVITAMIN-PRENATAL) 27-0.8 MG TABS tablet Take 1 tablet by mouth daily. Takes for nail and hair growth     . torsemide (DEMADEX) 10 MG tablet Take 1 tablet (10 mg total) by mouth daily.    Marland Kitchen amoxicillin-clavulanate (AUGMENTIN) 500-125 MG tablet Take 1 tablet (500 mg total) by mouth 2 (two) times daily. (Patient not taking: Reported on 05/06/2016) 10  tablet 0   No current facility-administered medications for this visit.     REVIEW OF SYSTEMS:  _0  denotes positive finding, _1  denotes negative finding Cardiac  Comments:  Chest pain or chest pressure:    Shortness of breath upon exertion:    Short of breath when lying flat:    Irregular heart rhythm:    Constitutional    Fever or chills:      PHYSICAL EXAM: Vitals:   05/06/16 0849  BP: 135/61  Pulse: 80  Resp: 20  Temp: 97.6 F (36.4 C)  TempSrc: Oral  SpO2: 98%  Weight: 233 lb (105.7 kg)  Height: _2  (1.575 m)    GENERAL: The patient is a well-nourished  female, in no acute distress. The vital signs are documented above. CARDIOVASCULAR: There is a regular rate and rhythm. PULMONARY: There is good air exchange bilaterally without wheezing or rales. The left groin looks fine without evidence of hematoma. The toes are essentially unchanged. There is some gangrene of all 5 toes. She has a brisk peroneal signal with the Doppler.  MEDICAL ISSUES:  EXTENSIVE RIGHT FOOT WOUND: The patient has a brisk peroneal signal with the Doppler/think her skin intervention was successful. She has peroneal runoff so I think this is good as the circulation will get. I've instructed her to keep either gel between the toes and then dried 2 x 2's and then wrapped the foot with Kerlix and a 4Inch ace daily. She should also wash the foot with soap and water. Upon seeing her back in 4 weeks. She knows to call sooner if she has problems. I did give her prescription for 20 Percocet for pain.  [name] [facility] [phone]

## 2016-05-07 ENCOUNTER — Other Ambulatory Visit: Payer: Self-pay | Admitting: *Deleted

## 2016-05-07 ENCOUNTER — Encounter: Payer: Self-pay | Admitting: Medical

## 2016-05-07 DIAGNOSIS — E11621 Type 2 diabetes mellitus with foot ulcer: Secondary | ICD-10-CM | POA: Diagnosis not present

## 2016-05-07 DIAGNOSIS — J449 Chronic obstructive pulmonary disease, unspecified: Secondary | ICD-10-CM | POA: Diagnosis not present

## 2016-05-07 DIAGNOSIS — E1152 Type 2 diabetes mellitus with diabetic peripheral angiopathy with gangrene: Secondary | ICD-10-CM | POA: Diagnosis not present

## 2016-05-07 DIAGNOSIS — I10 Essential (primary) hypertension: Secondary | ICD-10-CM | POA: Diagnosis not present

## 2016-05-07 DIAGNOSIS — I4891 Unspecified atrial fibrillation: Secondary | ICD-10-CM | POA: Diagnosis not present

## 2016-05-07 DIAGNOSIS — L97413 Non-pressure chronic ulcer of right heel and midfoot with necrosis of muscle: Secondary | ICD-10-CM | POA: Diagnosis not present

## 2016-05-07 DIAGNOSIS — I252 Old myocardial infarction: Secondary | ICD-10-CM | POA: Diagnosis not present

## 2016-05-07 DIAGNOSIS — L97513 Non-pressure chronic ulcer of other part of right foot with necrosis of muscle: Secondary | ICD-10-CM | POA: Diagnosis not present

## 2016-05-07 DIAGNOSIS — E785 Hyperlipidemia, unspecified: Secondary | ICD-10-CM | POA: Diagnosis not present

## 2016-05-07 MED ORDER — APIXABAN 5 MG PO TABS: 5.0000 mg | ORAL_TABLET | Freq: Two times a day (BID) | ORAL | 1 refills | Status: DC

## 2016-05-07 MED ORDER — APIXABAN 5 MG PO TABS: 5.0000 mg | ORAL_TABLET | Freq: Two times a day (BID) | ORAL | 0 refills | Status: DC

## 2016-05-07 NOTE — Telephone Encounter (Signed)
rx refill at pt request

## 2016-05-07 NOTE — Telephone Encounter (Signed)
Medication px and refill done by Cardiology in past, we have not filled, seen by Cardiology on 04/29/16. Please Advise on refills/SLS 03/09

## 2016-05-10 DIAGNOSIS — J449 Chronic obstructive pulmonary disease, unspecified: Secondary | ICD-10-CM | POA: Diagnosis not present

## 2016-05-10 DIAGNOSIS — E1152 Type 2 diabetes mellitus with diabetic peripheral angiopathy with gangrene: Secondary | ICD-10-CM | POA: Diagnosis not present

## 2016-05-10 DIAGNOSIS — E11621 Type 2 diabetes mellitus with foot ulcer: Secondary | ICD-10-CM | POA: Diagnosis not present

## 2016-05-10 DIAGNOSIS — L97513 Non-pressure chronic ulcer of other part of right foot with necrosis of muscle: Secondary | ICD-10-CM | POA: Diagnosis not present

## 2016-05-10 DIAGNOSIS — I4891 Unspecified atrial fibrillation: Secondary | ICD-10-CM | POA: Diagnosis not present

## 2016-05-10 DIAGNOSIS — E785 Hyperlipidemia, unspecified: Secondary | ICD-10-CM | POA: Diagnosis not present

## 2016-05-10 DIAGNOSIS — I10 Essential (primary) hypertension: Secondary | ICD-10-CM | POA: Diagnosis not present

## 2016-05-10 DIAGNOSIS — I252 Old myocardial infarction: Secondary | ICD-10-CM | POA: Diagnosis not present

## 2016-05-10 DIAGNOSIS — L97413 Non-pressure chronic ulcer of right heel and midfoot with necrosis of muscle: Secondary | ICD-10-CM | POA: Diagnosis not present

## 2016-05-12 DIAGNOSIS — L97513 Non-pressure chronic ulcer of other part of right foot with necrosis of muscle: Secondary | ICD-10-CM | POA: Diagnosis not present

## 2016-05-12 DIAGNOSIS — I4891 Unspecified atrial fibrillation: Secondary | ICD-10-CM | POA: Diagnosis not present

## 2016-05-12 DIAGNOSIS — E11621 Type 2 diabetes mellitus with foot ulcer: Secondary | ICD-10-CM | POA: Diagnosis not present

## 2016-05-12 DIAGNOSIS — E785 Hyperlipidemia, unspecified: Secondary | ICD-10-CM | POA: Diagnosis not present

## 2016-05-12 DIAGNOSIS — E1152 Type 2 diabetes mellitus with diabetic peripheral angiopathy with gangrene: Secondary | ICD-10-CM | POA: Diagnosis not present

## 2016-05-12 DIAGNOSIS — I252 Old myocardial infarction: Secondary | ICD-10-CM | POA: Diagnosis not present

## 2016-05-12 DIAGNOSIS — I10 Essential (primary) hypertension: Secondary | ICD-10-CM | POA: Diagnosis not present

## 2016-05-12 DIAGNOSIS — J449 Chronic obstructive pulmonary disease, unspecified: Secondary | ICD-10-CM | POA: Diagnosis not present

## 2016-05-12 DIAGNOSIS — L97413 Non-pressure chronic ulcer of right heel and midfoot with necrosis of muscle: Secondary | ICD-10-CM | POA: Diagnosis not present

## 2016-05-13 ENCOUNTER — Encounter: Payer: Self-pay | Admitting: Vascular Surgery

## 2016-05-13 DIAGNOSIS — E1152 Type 2 diabetes mellitus with diabetic peripheral angiopathy with gangrene: Secondary | ICD-10-CM | POA: Diagnosis not present

## 2016-05-13 DIAGNOSIS — I252 Old myocardial infarction: Secondary | ICD-10-CM | POA: Diagnosis not present

## 2016-05-13 DIAGNOSIS — I4891 Unspecified atrial fibrillation: Secondary | ICD-10-CM | POA: Diagnosis not present

## 2016-05-13 DIAGNOSIS — L97513 Non-pressure chronic ulcer of other part of right foot with necrosis of muscle: Secondary | ICD-10-CM | POA: Diagnosis not present

## 2016-05-13 DIAGNOSIS — J449 Chronic obstructive pulmonary disease, unspecified: Secondary | ICD-10-CM | POA: Diagnosis not present

## 2016-05-13 DIAGNOSIS — L97413 Non-pressure chronic ulcer of right heel and midfoot with necrosis of muscle: Secondary | ICD-10-CM | POA: Diagnosis not present

## 2016-05-13 DIAGNOSIS — E785 Hyperlipidemia, unspecified: Secondary | ICD-10-CM | POA: Diagnosis not present

## 2016-05-13 DIAGNOSIS — I10 Essential (primary) hypertension: Secondary | ICD-10-CM | POA: Diagnosis not present

## 2016-05-13 DIAGNOSIS — E11621 Type 2 diabetes mellitus with foot ulcer: Secondary | ICD-10-CM | POA: Diagnosis not present

## 2016-05-14 ENCOUNTER — Ambulatory Visit (INDEPENDENT_AMBULATORY_CARE_PROVIDER_SITE_OTHER): Payer: Medicare HMO | Admitting: Medical

## 2016-05-14 ENCOUNTER — Encounter: Payer: Self-pay | Admitting: Medical

## 2016-05-14 VITALS — BP 120/98 | HR 79 | Temp 97.9°F | Resp 16 | Ht 62.0 in | Wt 222.0 lb

## 2016-05-14 DIAGNOSIS — M79671 Pain in right foot: Secondary | ICD-10-CM

## 2016-05-14 DIAGNOSIS — I96 Gangrene, not elsewhere classified: Secondary | ICD-10-CM | POA: Diagnosis not present

## 2016-05-14 DIAGNOSIS — Z9889 Other specified postprocedural states: Secondary | ICD-10-CM | POA: Diagnosis not present

## 2016-05-14 MED ORDER — OXYCODONE-ACETAMINOPHEN 5-325 MG PO TABS: 1.0000 | ORAL_TABLET | Freq: Four times a day (QID) | ORAL | 0 refills | Status: DC | PRN

## 2016-05-14 NOTE — Progress Notes (Signed)
Subjective:    Patient ID: Alexis [name], female    DOB: 1950/12/24, 66 y.o.   MRN: [id number]  HPI  Pt in for follow up.  Pt had severe vascular blockage to her rt lower ext/foot. Pt had severe infection and she reported was gangrene. She had surgery for this and was discharged home.   Pt is seeing wound clinic. She has seen them twice and next week will see them again. Since surgery she has not seen wound clinic. She saw them before the surgery. Pt just saw surgeon on 05-06-2016. Summary for that day reads   EXTENSIVE RIGHT FOOT WOUND: The patient has a brisk peroneal signal with the Doppler/think her skin intervention was successful. She has peroneal runoff so I think this is good as the circulation will get. I've instructed her to keep either gel between the toes and then dried 2 x 2's and then wrapped the foot with Kerlix and a 4Inch ace daily. She should also wash the foot with soap and water. Upon seeing her back in 4 weeks. She knows to call sooner if she has problems. I did give her prescription for 20 Percocet for pain.  Pt has home health coming out. She came to home yesterday and will be at house on Monday.   Pt is on percocet presently. She takes it usually 1-2 tablets a day. Pt about to run out of percocet.      Review of Systems  Constitutional: Negative for chills, fatigue and fever.  Respiratory: Negative for cough, chest tightness and shortness of breath.   Cardiovascular: Negative for chest pain and palpitations.  Gastrointestinal: Negative for abdominal pain.  Musculoskeletal: Negative for back pain.       Rt foot pain when she walks. And when home health cleans/does treamtments.  Skin: Negative for rash.  Neurological: Negative for dizziness and headaches.  Hematological: Negative for adenopathy. Does not bruise/bleed easily.  Psychiatric/Behavioral: Negative for behavioral problems and confusion.    Past Medical History:  Diagnosis Date  . Allergy   .  Anxiety   . Arthritis   . Asthma   . Atrial fibrillation ([place])   . CHF (congestive heart failure) ([place])   . COPD (chronic obstructive pulmonary disease) ([place])   . Diabetes mellitus without complication ([place])   . Hyperlipidemia   . Hypertension   . Myocardial infarction    several  . Pacemaker    CRT-P with RV lead and His Bundle lead ( high threshold)   . Peripheral neuropathy ([place])   . Thyroid disease      Social History   Social History  . Marital status: Married    Spouse name: N/A  . Number of children: N/A  . Years of education: N/A   Occupational History  . retired    Social History Main Topics  . Smoking status: Former Smoker    Quit date: 03/01/1998  . Smokeless tobacco: Never Used  . 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  . Drug use: No     Comment: remote use  . Sexual activity: Not on file   Other Topics Concern  . Not on file   Social History Narrative  . No narrative on file    Past Surgical History:  Procedure Laterality Date  . ABDOMINAL AORTOGRAM W/LOWER EXTREMITY N/A 04/29/2016   Procedure: Abdominal Aortogram w/Lower Extremity;  Surgeon: Waynetta Sandy, MD;  Location: [facility];  Service: Cardiovascular;  Laterality: N/A;  . PERIPHERAL VASCULAR ATHERECTOMY Right 04/29/2016   Procedure: Peripheral Vascular Atherectomy-Right Popliteal;  Surgeon: Waynetta Sandy, MD;  Location: [facility];  Service: Cardiovascular;  Laterality: Right;  . PERIPHERAL VASCULAR INTERVENTION Right 04/29/2016   Procedure: Peripheral Vascular Intervention-Right Popliteal;  Surgeon: Waynetta Sandy, MD;  Location: [facility];  Service: Cardiovascular;  Laterality: Right;  POPLITEAL PTA  . RADIOFREQUENCY ABLATION    . TOTAL KNEE ARTHROPLASTY      Family History  Problem Relation Age of Onset  . Diabetes Mother   . Hyperlipidemia Mother   . Diabetes Father   . Hyperlipidemia  Father   . Heart disease Father     before age 36  . Heart attack Father   . Diabetes Brother   . Hyperlipidemia Brother   . Heart attack Brother     Allergies  Allergen Reactions  . Indomethacin Other (See Comments)     Renal Insufficiency  . Pregabalin Other (See Comments)    DIZZINESS   . Sulfa Antibiotics Hives    Current Outpatient Prescriptions on File Prior to Visit  Medication Sig Dispense Refill  . acetaminophen (TYLENOL) 325 MG tablet Take 2 tablets (650 mg total) by mouth every 4 (four) hours as needed for headache or mild pain.    Marland Kitchen albuterol (PROVENTIL HFA;VENTOLIN HFA) 108 (90 Base) MCG/ACT inhaler Inhale 2 puffs into the lungs every 6 (six) hours as needed for wheezing or shortness of breath. 1 Inhaler 2  . albuterol (PROVENTIL) (2.5 MG/3ML) 0.083% nebulizer solution Take 3 mLs (2.5 mg total) by nebulization every 6 (six) hours as needed for wheezing or shortness of breath. 150 mL 1  . allopurinol (ZYLOPRIM) 100 MG tablet Take ONE (1) tablet by mouth once daily (Patient taking differently: Take 100 mg by mouth at bedtime. ) 90 tablet 3  . apixaban (ELIQUIS) 5 MG TABS tablet Take 1 tablet (5 mg total) by mouth 2 (two) times daily. 180 tablet 1  . Blood Glucose Monitoring Suppl (GLUCOCOM BLOOD GLUCOSE MONITOR) DEVI Use to check blood sugars 6 times daily E11.65    . DULoxetine (CYMBALTA) 30 MG capsule Take 1 capsule (30 mg total) by mouth at bedtime. 30 capsule 0  . fluticasone (FLONASE) 50 MCG/ACT nasal spray Place 1 spray into both nostrils daily. (Patient taking differently: Place 1 spray into both nostrils at bedtime. Place 1 spray into both nostrils daily.) 16 g 3  . fluticasone (FLOVENT HFA) 110 MCG/ACT inhaler Inhale 2 puffs into the lungs 2 (two) times daily. (Patient taking differently: Inhale 2 puffs into the lungs as needed (bronchitis). ) 1 Inhaler 3  . Insulin Disposable Pump (V-GO 40) KIT Inject into the skin See admin instructions. Use with Novolog - 6 pumps  before each meal    . Insulin Pen Needle (B-D ULTRAFINE III SHORT PEN) 31G X 8 MM MISC Use for insulin pen up to five times daily E11.65    . levothyroxine (SYNTHROID, LEVOTHROID) 175 MCG tablet Take 175 mcg by mouth daily before breakfast.    . metoprolol succinate (TOPROL-XL) 25 MG 24 hr tablet Take 1 tablet by mouth daily.    Marland Kitchen oxyCODONE-acetaminophen (PERCOCET/ROXICET) 5-325 MG tablet Take 1-2 tablets by mouth every 6 (six) hours as needed for severe pain. 15 tablet 0  . Prenatal Vit-Fe Fumarate-FA (MULTIVITAMIN-PRENATAL) 27-0.8 MG TABS tablet Take 1 tablet by mouth daily. Takes for nail and hair growth     .  torsemide (DEMADEX) 10 MG tablet Take 1 tablet (10 mg total) by mouth daily.     No current facility-administered medications on file prior to visit.     BP (!) 120/98 (BP Location: Left Arm, Patient Position: Sitting, Cuff Size: Large)   Pulse 79   Temp 97.9 F (36.6 C) (Oral)   Resp 16   Ht 5' 2" (1.575 m)   Wt 222 lb (100.7 kg)   SpO2 98%   BMI 40.60 kg/m       Objective:   Physical Exam  General- No acute distress. Pleasant patient. Neck- Full range of motion, no jvd Lungs- Clear, even and unlabored. Heart- regular rate and rhythm. Neurologic- CNII- XII grossly intact.  Rt lower ext- foot wrapped today and she has post op shoe.      Assessment & Plan:  By review of surgeon follow up visit it appears you are now on the right track.  Keep you blood sugars controlled and continue insulin pump.  For your foot pain will refill your percocet. But plan to taper you off in about 2 weeks.  Continue with home health and follow up with Wound care a week from today.   Follow up with me in 2 weeks or as needed  [place], Percell Miller, [place]

## 2016-05-14 NOTE — Patient Instructions (Signed)
By review of surgeon follow up visit it appears you are now on the right track.  Keep you blood sugars controlled and continue insulin pump.  For your foot pain will refill your percocet. But plan to taper you off in about 2 weeks.  Continue with home health and follow up with Wound care a week from today.   Follow up with me in 2 weeks or as needed

## 2016-05-14 NOTE — Progress Notes (Signed)
Pre visit review using our clinic review tool, if applicable. No additional management support is needed unless otherwise documented below in the visit note/SLS  

## 2016-05-17 DIAGNOSIS — L97413 Non-pressure chronic ulcer of right heel and midfoot with necrosis of muscle: Secondary | ICD-10-CM | POA: Diagnosis not present

## 2016-05-17 DIAGNOSIS — I252 Old myocardial infarction: Secondary | ICD-10-CM | POA: Diagnosis not present

## 2016-05-17 DIAGNOSIS — I10 Essential (primary) hypertension: Secondary | ICD-10-CM | POA: Diagnosis not present

## 2016-05-17 DIAGNOSIS — I4891 Unspecified atrial fibrillation: Secondary | ICD-10-CM | POA: Diagnosis not present

## 2016-05-17 DIAGNOSIS — J449 Chronic obstructive pulmonary disease, unspecified: Secondary | ICD-10-CM | POA: Diagnosis not present

## 2016-05-17 DIAGNOSIS — E1152 Type 2 diabetes mellitus with diabetic peripheral angiopathy with gangrene: Secondary | ICD-10-CM | POA: Diagnosis not present

## 2016-05-17 DIAGNOSIS — E785 Hyperlipidemia, unspecified: Secondary | ICD-10-CM | POA: Diagnosis not present

## 2016-05-17 DIAGNOSIS — E11621 Type 2 diabetes mellitus with foot ulcer: Secondary | ICD-10-CM | POA: Diagnosis not present

## 2016-05-17 DIAGNOSIS — L97513 Non-pressure chronic ulcer of other part of right foot with necrosis of muscle: Secondary | ICD-10-CM | POA: Diagnosis not present

## 2016-05-18 ENCOUNTER — Telehealth: Payer: Self-pay

## 2016-05-18 DIAGNOSIS — E1152 Type 2 diabetes mellitus with diabetic peripheral angiopathy with gangrene: Secondary | ICD-10-CM | POA: Diagnosis not present

## 2016-05-18 DIAGNOSIS — J449 Chronic obstructive pulmonary disease, unspecified: Secondary | ICD-10-CM | POA: Diagnosis not present

## 2016-05-18 DIAGNOSIS — L97413 Non-pressure chronic ulcer of right heel and midfoot with necrosis of muscle: Secondary | ICD-10-CM | POA: Diagnosis not present

## 2016-05-18 DIAGNOSIS — E785 Hyperlipidemia, unspecified: Secondary | ICD-10-CM | POA: Diagnosis not present

## 2016-05-18 DIAGNOSIS — I252 Old myocardial infarction: Secondary | ICD-10-CM | POA: Diagnosis not present

## 2016-05-18 DIAGNOSIS — I10 Essential (primary) hypertension: Secondary | ICD-10-CM | POA: Diagnosis not present

## 2016-05-18 DIAGNOSIS — I4891 Unspecified atrial fibrillation: Secondary | ICD-10-CM | POA: Diagnosis not present

## 2016-05-18 DIAGNOSIS — E11621 Type 2 diabetes mellitus with foot ulcer: Secondary | ICD-10-CM | POA: Diagnosis not present

## 2016-05-18 DIAGNOSIS — L97513 Non-pressure chronic ulcer of other part of right foot with necrosis of muscle: Secondary | ICD-10-CM | POA: Diagnosis not present

## 2016-05-18 NOTE — Telephone Encounter (Signed)
-----   Message from Lujean Amel sent at 05/18/2016 10:30 AM EDT ----- Regarding: Triage Phone Call Contact: [phone] Arbie Cookey, I was calling this patient to remind her of her appointmentt w/ [facility] for next Wednesday 05/26/16. She mentioned that she has had terrible achy pain in both legs for the past two mornings. Pain meds do help. She asked if a nurse could call her back to discuss this. I offered her an appointment for tomorrow w/ [facility] but she declined due to the pending weather forecast and also her husband's work schedule/he is her only source of transportation. Thanks, Anne Ng

## 2016-05-18 NOTE — Telephone Encounter (Signed)
Attempted to return call to pt. to discuss symptoms; left voice message to call office.

## 2016-05-19 DIAGNOSIS — L97513 Non-pressure chronic ulcer of other part of right foot with necrosis of muscle: Secondary | ICD-10-CM | POA: Diagnosis not present

## 2016-05-19 DIAGNOSIS — I10 Essential (primary) hypertension: Secondary | ICD-10-CM | POA: Diagnosis not present

## 2016-05-19 DIAGNOSIS — L97413 Non-pressure chronic ulcer of right heel and midfoot with necrosis of muscle: Secondary | ICD-10-CM | POA: Diagnosis not present

## 2016-05-19 DIAGNOSIS — E1152 Type 2 diabetes mellitus with diabetic peripheral angiopathy with gangrene: Secondary | ICD-10-CM | POA: Diagnosis not present

## 2016-05-19 DIAGNOSIS — E11621 Type 2 diabetes mellitus with foot ulcer: Secondary | ICD-10-CM | POA: Diagnosis not present

## 2016-05-19 DIAGNOSIS — J449 Chronic obstructive pulmonary disease, unspecified: Secondary | ICD-10-CM | POA: Diagnosis not present

## 2016-05-19 DIAGNOSIS — E785 Hyperlipidemia, unspecified: Secondary | ICD-10-CM | POA: Diagnosis not present

## 2016-05-19 DIAGNOSIS — I4891 Unspecified atrial fibrillation: Secondary | ICD-10-CM | POA: Diagnosis not present

## 2016-05-19 DIAGNOSIS — I252 Old myocardial infarction: Secondary | ICD-10-CM | POA: Diagnosis not present

## 2016-05-21 ENCOUNTER — Encounter (HOSPITAL_BASED_OUTPATIENT_CLINIC_OR_DEPARTMENT_OTHER): Payer: Medicare HMO

## 2016-05-24 ENCOUNTER — Encounter (HOSPITAL_BASED_OUTPATIENT_CLINIC_OR_DEPARTMENT_OTHER): Payer: Medicare HMO | Attending: Internal Medicine

## 2016-05-24 DIAGNOSIS — Z87891 Personal history of nicotine dependence: Secondary | ICD-10-CM | POA: Insufficient documentation

## 2016-05-24 DIAGNOSIS — Z794 Long term (current) use of insulin: Secondary | ICD-10-CM | POA: Insufficient documentation

## 2016-05-24 DIAGNOSIS — L97511 Non-pressure chronic ulcer of other part of right foot limited to breakdown of skin: Secondary | ICD-10-CM | POA: Insufficient documentation

## 2016-05-24 DIAGNOSIS — I10 Essential (primary) hypertension: Secondary | ICD-10-CM | POA: Diagnosis not present

## 2016-05-24 DIAGNOSIS — E11621 Type 2 diabetes mellitus with foot ulcer: Secondary | ICD-10-CM | POA: Insufficient documentation

## 2016-05-24 DIAGNOSIS — S91104A Unspecified open wound of right lesser toe(s) without damage to nail, initial encounter: Secondary | ICD-10-CM | POA: Diagnosis not present

## 2016-05-24 DIAGNOSIS — S91101A Unspecified open wound of right great toe without damage to nail, initial encounter: Secondary | ICD-10-CM | POA: Diagnosis not present

## 2016-05-25 DIAGNOSIS — I10 Essential (primary) hypertension: Secondary | ICD-10-CM | POA: Diagnosis not present

## 2016-05-25 DIAGNOSIS — I252 Old myocardial infarction: Secondary | ICD-10-CM | POA: Diagnosis not present

## 2016-05-25 DIAGNOSIS — E1152 Type 2 diabetes mellitus with diabetic peripheral angiopathy with gangrene: Secondary | ICD-10-CM | POA: Diagnosis not present

## 2016-05-25 DIAGNOSIS — L97513 Non-pressure chronic ulcer of other part of right foot with necrosis of muscle: Secondary | ICD-10-CM | POA: Diagnosis not present

## 2016-05-25 DIAGNOSIS — I4891 Unspecified atrial fibrillation: Secondary | ICD-10-CM | POA: Diagnosis not present

## 2016-05-25 DIAGNOSIS — E11621 Type 2 diabetes mellitus with foot ulcer: Secondary | ICD-10-CM | POA: Diagnosis not present

## 2016-05-25 DIAGNOSIS — E785 Hyperlipidemia, unspecified: Secondary | ICD-10-CM | POA: Diagnosis not present

## 2016-05-25 DIAGNOSIS — L97413 Non-pressure chronic ulcer of right heel and midfoot with necrosis of muscle: Secondary | ICD-10-CM | POA: Diagnosis not present

## 2016-05-25 DIAGNOSIS — J449 Chronic obstructive pulmonary disease, unspecified: Secondary | ICD-10-CM | POA: Diagnosis not present

## 2016-05-26 ENCOUNTER — Telehealth: Payer: Self-pay | Admitting: Medical

## 2016-05-26 ENCOUNTER — Encounter: Payer: Self-pay | Admitting: Vascular Surgery

## 2016-05-26 ENCOUNTER — Ambulatory Visit (INDEPENDENT_AMBULATORY_CARE_PROVIDER_SITE_OTHER): Payer: Medicare HMO | Admitting: Vascular Surgery

## 2016-05-26 VITALS — BP 137/65 | HR 85 | Temp 97.0°F | Resp 18 | Ht 62.0 in | Wt 222.0 lb

## 2016-05-26 DIAGNOSIS — I739 Peripheral vascular disease, unspecified: Secondary | ICD-10-CM | POA: Diagnosis not present

## 2016-05-26 DIAGNOSIS — Z48812 Encounter for surgical aftercare following surgery on the circulatory system: Secondary | ICD-10-CM | POA: Diagnosis not present

## 2016-05-26 MED ORDER — OXYCODONE-ACETAMINOPHEN 5-325 MG PO TABS: 1.0000 | ORAL_TABLET | Freq: Four times a day (QID) | ORAL | 0 refills | Status: DC | PRN

## 2016-05-26 NOTE — Telephone Encounter (Signed)
Pt can pick up rx oxycodone tomorrow. But also have her schedule appointment next week. I want to discuss option of different pain meds. She will need to sign contract etc. So go ahead and schedule her next Friday.

## 2016-05-26 NOTE — Telephone Encounter (Signed)
Requesting:   Oxycodone Contract   none UDS   none Last OV   05/14/2016-----next appt is on  06/01/2016 Last Refill    #20 no refills on 05/14/2016  Please Advise

## 2016-05-26 NOTE — Telephone Encounter (Signed)
Relation to XG:KMKT Call back number:[phone]  Reason for call:  Patient requesting a refill oxyCODONE-acetaminophen (PERCOCET/ROXICET) 5-325 MG tablet

## 2016-05-26 NOTE — Progress Notes (Signed)
Patient name: Alexis [name] MRN: [id number] DOB: 02-16-1951 Sex: female  REASON FOR VISIT: Follow up.  HPI: Alexis [name] is a 66 y.o. female who presented with multiple gangrenous toes of the right foot. On 04/29/2016 she underwent atherectomy of the right superficial femoral artery and popliteal artery with a drug coated balloon angioplasty of both arteries. She had an excellent result. She only has peroneal runoff on the right. I last saw her on 05/06/2016. At that time she had a brisk peroneal signal with the Doppler. I instructed her on her wound care center up for a 4 week follow up visit.  She comes in for a routine follow up visit. She's made excellent progress with her wounds and has been soaking her foot daily in lukewarm dial soap soaks. She is not a smoker. She does have neuropathy in both feet.  Current Outpatient Prescriptions  Medication Sig Dispense Refill  . acetaminophen (TYLENOL) 325 MG tablet Take 2 tablets (650 mg total) by mouth every 4 (four) hours as needed for headache or mild pain.    Marland Kitchen albuterol (PROVENTIL HFA;VENTOLIN HFA) 108 (90 Base) MCG/ACT inhaler Inhale 2 puffs into the lungs every 6 (six) hours as needed for wheezing or shortness of breath. 1 Inhaler 2  . albuterol (PROVENTIL) (2.5 MG/3ML) 0.083% nebulizer solution Take 3 mLs (2.5 mg total) by nebulization every 6 (six) hours as needed for wheezing or shortness of breath. 150 mL 1  . allopurinol (ZYLOPRIM) 100 MG tablet Take ONE (1) tablet by mouth once daily (Patient taking differently: Take 100 mg by mouth at bedtime. ) 90 tablet 3  . apixaban (ELIQUIS) 5 MG TABS tablet Take 1 tablet (5 mg total) by mouth 2 (two) times daily. 180 tablet 1  . Blood Glucose Monitoring Suppl (GLUCOCOM BLOOD GLUCOSE MONITOR) DEVI Use to check blood sugars 6 times daily E11.65    . DULoxetine (CYMBALTA) 30 MG capsule Take 1 capsule (30 mg total) by mouth at bedtime. 30 capsule 0  . fluticasone (FLONASE) 50 MCG/ACT nasal  spray Place 1 spray into both nostrils daily. (Patient taking differently: Place 1 spray into both nostrils at bedtime. Place 1 spray into both nostrils daily.) 16 g 3  . fluticasone (FLOVENT HFA) 110 MCG/ACT inhaler Inhale 2 puffs into the lungs 2 (two) times daily. (Patient taking differently: Inhale 2 puffs into the lungs as needed (bronchitis). ) 1 Inhaler 3  . Insulin Disposable Pump (V-GO 40) KIT Inject into the skin See admin instructions. Use with Novolog - 6 pumps before each meal    . Insulin Pen Needle (B-D ULTRAFINE III SHORT PEN) 31G X 8 MM MISC Use for insulin pen up to five times daily E11.65    . levothyroxine (SYNTHROID, LEVOTHROID) 175 MCG tablet Take 175 mcg by mouth daily before breakfast.    . metoprolol succinate (TOPROL-XL) 25 MG 24 hr tablet Take 1 tablet by mouth daily.    Marland Kitchen oxyCODONE-acetaminophen (PERCOCET/ROXICET) 5-325 MG tablet Take 1-2 tablets by mouth every 6 (six) hours as needed for severe pain. 15 tablet 0  . oxyCODONE-acetaminophen (ROXICET) 5-325 MG tablet Take 1 tablet by mouth every 6 (six) hours as needed for severe pain. 20 tablet 0  . Prenatal Vit-Fe Fumarate-FA (MULTIVITAMIN-PRENATAL) 27-0.8 MG TABS tablet Take 1 tablet by mouth daily. Takes for nail and hair growth     . torsemide (DEMADEX) 10 MG tablet Take 1 tablet (10 mg total) by mouth daily.     No current facility-administered  medications for this visit.     REVIEW OF SYSTEMS:  [X]  denotes positive finding, [ ]  denotes negative finding Cardiac  Comments:  Chest pain or chest pressure:    Shortness of breath upon exertion:    Short of breath when lying flat:    Irregular heart rhythm:    Constitutional    Fever or chills:      PHYSICAL EXAM: Vitals:   05/26/16 1148  BP: 137/65  Pulse: 85  Resp: 18  Temp: 97 F (36.1 C)  TempSrc: Oral  SpO2: 97%  Weight: 222 lb (100.7 kg)  Height: 5' 2"  (1.575 m)    GENERAL: The patient is a well-nourished female, in no acute distress. The vital  signs are documented above. CARDIOVASCULAR: There is a regular rate and rhythm. PULMONARY: There is good air exchange bilaterally without wheezing or rales. She has an excellent peroneal signal with the Doppler on the right side. Her wounds have improved significantly.  MEDICAL ISSUES:  STATUS POST RIGHT SUPERFICIAL FEMORAL ARTERY AND POPLITEAL ARTERY ATHERECTOMY AND BALLOON ANGIOPLASTY: This patient has had an excellent result from her endovascular procedure by Dr. Donzetta Matters. She has peroneal runoff only and has a brisk signal with the Doppler. Her wounds have improved significantly. I'll see her back in 3 months with follow up studies at that time. She'll continue her wound care as is as this has been working. She knows to call sooner if she has problems.  [name] [facility] [phone]

## 2016-05-27 ENCOUNTER — Encounter: Payer: Self-pay | Admitting: Medical

## 2016-05-27 NOTE — Telephone Encounter (Signed)
Patient informed of instructions. She stated that this should be her last refill as she is almost healed

## 2016-05-27 NOTE — Telephone Encounter (Signed)
Called left message to call back. Contract is printed and attached to script.

## 2016-05-27 NOTE — Addendum Note (Signed)
Addended by: Lianne Cure A on: 05/27/2016 09:24 AM   Modules accepted: Orders

## 2016-05-28 DIAGNOSIS — I252 Old myocardial infarction: Secondary | ICD-10-CM | POA: Diagnosis not present

## 2016-05-28 DIAGNOSIS — I10 Essential (primary) hypertension: Secondary | ICD-10-CM | POA: Diagnosis not present

## 2016-05-28 DIAGNOSIS — I4891 Unspecified atrial fibrillation: Secondary | ICD-10-CM | POA: Diagnosis not present

## 2016-05-28 DIAGNOSIS — J449 Chronic obstructive pulmonary disease, unspecified: Secondary | ICD-10-CM | POA: Diagnosis not present

## 2016-05-28 DIAGNOSIS — E11621 Type 2 diabetes mellitus with foot ulcer: Secondary | ICD-10-CM | POA: Diagnosis not present

## 2016-05-28 DIAGNOSIS — L97413 Non-pressure chronic ulcer of right heel and midfoot with necrosis of muscle: Secondary | ICD-10-CM | POA: Diagnosis not present

## 2016-05-28 DIAGNOSIS — E785 Hyperlipidemia, unspecified: Secondary | ICD-10-CM | POA: Diagnosis not present

## 2016-05-28 DIAGNOSIS — L97513 Non-pressure chronic ulcer of other part of right foot with necrosis of muscle: Secondary | ICD-10-CM | POA: Diagnosis not present

## 2016-05-28 DIAGNOSIS — E1152 Type 2 diabetes mellitus with diabetic peripheral angiopathy with gangrene: Secondary | ICD-10-CM | POA: Diagnosis not present

## 2016-05-31 ENCOUNTER — Encounter (HOSPITAL_BASED_OUTPATIENT_CLINIC_OR_DEPARTMENT_OTHER): Payer: Medicare HMO | Attending: Internal Medicine

## 2016-05-31 DIAGNOSIS — Z794 Long term (current) use of insulin: Secondary | ICD-10-CM | POA: Diagnosis not present

## 2016-05-31 DIAGNOSIS — I10 Essential (primary) hypertension: Secondary | ICD-10-CM | POA: Insufficient documentation

## 2016-05-31 DIAGNOSIS — E1151 Type 2 diabetes mellitus with diabetic peripheral angiopathy without gangrene: Secondary | ICD-10-CM | POA: Insufficient documentation

## 2016-05-31 DIAGNOSIS — E1142 Type 2 diabetes mellitus with diabetic polyneuropathy: Secondary | ICD-10-CM | POA: Insufficient documentation

## 2016-05-31 DIAGNOSIS — E11621 Type 2 diabetes mellitus with foot ulcer: Secondary | ICD-10-CM | POA: Insufficient documentation

## 2016-05-31 DIAGNOSIS — A4901 Methicillin susceptible Staphylococcus aureus infection, unspecified site: Secondary | ICD-10-CM | POA: Diagnosis not present

## 2016-05-31 DIAGNOSIS — L97511 Non-pressure chronic ulcer of other part of right foot limited to breakdown of skin: Secondary | ICD-10-CM | POA: Insufficient documentation

## 2016-05-31 DIAGNOSIS — L97512 Non-pressure chronic ulcer of other part of right foot with fat layer exposed: Secondary | ICD-10-CM | POA: Diagnosis not present

## 2016-05-31 DIAGNOSIS — I70235 Atherosclerosis of native arteries of right leg with ulceration of other part of foot: Secondary | ICD-10-CM | POA: Diagnosis not present

## 2016-06-01 ENCOUNTER — Ambulatory Visit: Payer: Medicare HMO | Admitting: Medical

## 2016-06-01 DIAGNOSIS — E785 Hyperlipidemia, unspecified: Secondary | ICD-10-CM | POA: Diagnosis not present

## 2016-06-01 DIAGNOSIS — E1152 Type 2 diabetes mellitus with diabetic peripheral angiopathy with gangrene: Secondary | ICD-10-CM | POA: Diagnosis not present

## 2016-06-01 DIAGNOSIS — J449 Chronic obstructive pulmonary disease, unspecified: Secondary | ICD-10-CM | POA: Diagnosis not present

## 2016-06-01 DIAGNOSIS — E11621 Type 2 diabetes mellitus with foot ulcer: Secondary | ICD-10-CM | POA: Diagnosis not present

## 2016-06-01 DIAGNOSIS — I4891 Unspecified atrial fibrillation: Secondary | ICD-10-CM | POA: Diagnosis not present

## 2016-06-01 DIAGNOSIS — I10 Essential (primary) hypertension: Secondary | ICD-10-CM | POA: Diagnosis not present

## 2016-06-01 DIAGNOSIS — I252 Old myocardial infarction: Secondary | ICD-10-CM | POA: Diagnosis not present

## 2016-06-01 DIAGNOSIS — L97513 Non-pressure chronic ulcer of other part of right foot with necrosis of muscle: Secondary | ICD-10-CM | POA: Diagnosis not present

## 2016-06-01 DIAGNOSIS — L97413 Non-pressure chronic ulcer of right heel and midfoot with necrosis of muscle: Secondary | ICD-10-CM | POA: Diagnosis not present

## 2016-06-03 ENCOUNTER — Telehealth: Payer: Self-pay | Admitting: Medical

## 2016-06-03 NOTE — Telephone Encounter (Signed)
Called patient to schedule awv. Lvm for patient to call office to schedule appt.  °

## 2016-06-04 ENCOUNTER — Encounter: Payer: Self-pay | Admitting: Vascular Surgery

## 2016-06-06 ENCOUNTER — Encounter: Payer: Self-pay | Admitting: Interventional Cardiology

## 2016-06-07 ENCOUNTER — Ambulatory Visit: Payer: Medicare HMO | Admitting: Medical

## 2016-06-07 DIAGNOSIS — E11621 Type 2 diabetes mellitus with foot ulcer: Secondary | ICD-10-CM | POA: Diagnosis not present

## 2016-06-07 DIAGNOSIS — A4901 Methicillin susceptible Staphylococcus aureus infection, unspecified site: Secondary | ICD-10-CM | POA: Diagnosis not present

## 2016-06-07 DIAGNOSIS — L97511 Non-pressure chronic ulcer of other part of right foot limited to breakdown of skin: Secondary | ICD-10-CM | POA: Diagnosis not present

## 2016-06-07 DIAGNOSIS — I10 Essential (primary) hypertension: Secondary | ICD-10-CM | POA: Diagnosis not present

## 2016-06-07 DIAGNOSIS — L97512 Non-pressure chronic ulcer of other part of right foot with fat layer exposed: Secondary | ICD-10-CM | POA: Diagnosis not present

## 2016-06-07 DIAGNOSIS — E1151 Type 2 diabetes mellitus with diabetic peripheral angiopathy without gangrene: Secondary | ICD-10-CM | POA: Diagnosis not present

## 2016-06-07 DIAGNOSIS — Z794 Long term (current) use of insulin: Secondary | ICD-10-CM | POA: Diagnosis not present

## 2016-06-07 DIAGNOSIS — E1142 Type 2 diabetes mellitus with diabetic polyneuropathy: Secondary | ICD-10-CM | POA: Diagnosis not present

## 2016-06-07 DIAGNOSIS — I70235 Atherosclerosis of native arteries of right leg with ulceration of other part of foot: Secondary | ICD-10-CM | POA: Diagnosis not present

## 2016-06-09 ENCOUNTER — Ambulatory Visit (INDEPENDENT_AMBULATORY_CARE_PROVIDER_SITE_OTHER): Payer: Medicare HMO | Admitting: Medical

## 2016-06-09 ENCOUNTER — Encounter: Payer: Self-pay | Admitting: Cardiology

## 2016-06-09 ENCOUNTER — Ambulatory Visit (INDEPENDENT_AMBULATORY_CARE_PROVIDER_SITE_OTHER): Payer: Medicare HMO | Admitting: Cardiology

## 2016-06-09 ENCOUNTER — Encounter: Payer: Self-pay | Admitting: Medical

## 2016-06-09 VITALS — BP 128/62 | HR 60 | Temp 97.8°F | Resp 18 | Ht 62.0 in | Wt 223.0 lb

## 2016-06-09 VITALS — BP 122/68 | HR 84 | Ht 62.0 in | Wt 223.0 lb

## 2016-06-09 DIAGNOSIS — I4891 Unspecified atrial fibrillation: Secondary | ICD-10-CM | POA: Diagnosis not present

## 2016-06-09 DIAGNOSIS — G629 Polyneuropathy, unspecified: Secondary | ICD-10-CM | POA: Diagnosis not present

## 2016-06-09 DIAGNOSIS — I4819 Other persistent atrial fibrillation: Secondary | ICD-10-CM

## 2016-06-09 DIAGNOSIS — L97513 Non-pressure chronic ulcer of other part of right foot with necrosis of muscle: Secondary | ICD-10-CM | POA: Diagnosis not present

## 2016-06-09 DIAGNOSIS — I252 Old myocardial infarction: Secondary | ICD-10-CM | POA: Diagnosis not present

## 2016-06-09 DIAGNOSIS — J449 Chronic obstructive pulmonary disease, unspecified: Secondary | ICD-10-CM | POA: Diagnosis not present

## 2016-06-09 DIAGNOSIS — E1152 Type 2 diabetes mellitus with diabetic peripheral angiopathy with gangrene: Secondary | ICD-10-CM | POA: Diagnosis not present

## 2016-06-09 DIAGNOSIS — E785 Hyperlipidemia, unspecified: Secondary | ICD-10-CM | POA: Diagnosis not present

## 2016-06-09 DIAGNOSIS — I10 Essential (primary) hypertension: Secondary | ICD-10-CM | POA: Diagnosis not present

## 2016-06-09 DIAGNOSIS — I96 Gangrene, not elsewhere classified: Secondary | ICD-10-CM

## 2016-06-09 DIAGNOSIS — I481 Persistent atrial fibrillation: Secondary | ICD-10-CM

## 2016-06-09 DIAGNOSIS — E11621 Type 2 diabetes mellitus with foot ulcer: Secondary | ICD-10-CM | POA: Diagnosis not present

## 2016-06-09 DIAGNOSIS — L97413 Non-pressure chronic ulcer of right heel and midfoot with necrosis of muscle: Secondary | ICD-10-CM | POA: Diagnosis not present

## 2016-06-09 MED ORDER — PREGABALIN 25 MG PO CAPS: 25.0000 mg | ORAL_CAPSULE | Freq: Three times a day (TID) | ORAL | 0 refills | Status: DC

## 2016-06-09 MED ORDER — OXYCODONE-ACETAMINOPHEN 5-325 MG PO TABS: 1.0000 | ORAL_TABLET | Freq: Four times a day (QID) | ORAL | 0 refills | Status: DC | PRN

## 2016-06-09 NOTE — Patient Instructions (Addendum)
For your neuropathic type pain rx low dose lyrica. Will gradually increase maybe up to 75 mg at 3 times daily use. Will try to not write higher dose as you had dizziness in past at higher dose.   Call me in one week and give me update on how you are tolerating. Then decide in one week if will increase neurontin to 50 mg 3 times daily.  Limited rx of OxyContin written today. Can use before your wound care debridement procedures.   Follow up in 3 weeks or as needed

## 2016-06-09 NOTE — Progress Notes (Signed)
Subjective:    Patient ID: Alexis [name], female    DOB: 19-Jul-1950, 66 y.o.   MRN: [id number]  HPI  Pt in for follow up on her rt foot pain. She shows me pictures showing progressive improvement. She had necrotic toes and she had been going to wound care for debridement of wound after having had vascular surgery. She is improving with wounds but still has nerve type pain. Her worst day are when get debridement. Sees wound care one time a week.  Pt has history of diabetes and peripheral neuropathy. She wants to retry her former lyrica. She states in past with high doses of lyrica had dizzines and fell. But with low dose she could tolerate that dose.  Pt is only on tylenol presenlty. But also wants oxycontin refill for days she goes to wound care. These are her most painful days.  Pt wants to try lyrica again  Her wound care MD told  her nerve pain flared up due to recent infection and healing process  Review of Systems  Constitutional: Negative for chills, fatigue and fever.  Respiratory: Negative for cough, choking, shortness of breath and wheezing.   Cardiovascular: Negative for chest pain and palpitations.  Gastrointestinal: Negative for abdominal pain, diarrhea, nausea and vomiting.  Genitourinary: Negative for difficulty urinating, dysuria, flank pain, frequency, hematuria and urgency.  Musculoskeletal: Negative for back pain.       Rt foot pain.  Neurological:       Lower ext neuropathic type pain.   Past Medical History:  Diagnosis Date  . Allergy   . Anxiety   . Arthritis   . Asthma   . Atrial fibrillation ([place])   . CHF (congestive heart failure) ([place])   . COPD (chronic obstructive pulmonary disease) ([place])   . Diabetes mellitus without complication ([place])   . Hyperlipidemia   . Hypertension   . Myocardial infarction    several  . Pacemaker    CRT-P with RV lead and His Bundle lead ( high threshold)   . Peripheral neuropathy ([place])   . Thyroid disease        Social History   Social History  . Marital status: Married    Spouse name: N/A  . Number of children: N/A  . Years of education: N/A   Occupational History  . retired    Social History Main Topics  . Smoking status: Former Smoker    Quit date: 03/01/1998  . Smokeless tobacco: Never Used  . 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  . Drug use: No     Comment: remote use  . Sexual activity: Not on file   Other Topics Concern  . Not on file   Social History Narrative  . No narrative on file    Past Surgical History:  Procedure Laterality Date  . ABDOMINAL AORTOGRAM W/LOWER EXTREMITY N/A 04/29/2016   Procedure: Abdominal Aortogram w/Lower Extremity;  Surgeon: Waynetta Sandy, MD;  Location: [facility];  Service: Cardiovascular;  Laterality: N/A;  . PERIPHERAL VASCULAR ATHERECTOMY Right 04/29/2016   Procedure: Peripheral Vascular Atherectomy-Right Popliteal;  Surgeon: Waynetta Sandy, MD;  Location: [facility];  Service: Cardiovascular;  Laterality: Right;  . PERIPHERAL VASCULAR INTERVENTION Right 04/29/2016   Procedure: Peripheral Vascular Intervention-Right Popliteal;  Surgeon: Waynetta Sandy, MD;  Location: [facility];  Service: Cardiovascular;  Laterality: Right;  POPLITEAL PTA  . RADIOFREQUENCY ABLATION    .  TOTAL KNEE ARTHROPLASTY      Family History  Problem Relation Age of Onset  . Diabetes Mother   . Hyperlipidemia Mother   . Diabetes Father   . Hyperlipidemia Father   . Heart disease Father     before age 75  . Heart attack Father   . Diabetes Brother   . Hyperlipidemia Brother   . Heart attack Brother     Allergies  Allergen Reactions  . Indomethacin Other (See Comments)     Renal Insufficiency  . Pregabalin Other (See Comments)    DIZZINESS   . Sulfa Antibiotics Hives    Current Outpatient Prescriptions on File Prior to Visit  Medication Sig Dispense Refill   . acetaminophen (TYLENOL) 325 MG tablet Take 2 tablets (650 mg total) by mouth every 4 (four) hours as needed for headache or mild pain.    Marland Kitchen albuterol (PROVENTIL HFA;VENTOLIN HFA) 108 (90 Base) MCG/ACT inhaler Inhale 2 puffs into the lungs every 6 (six) hours as needed for wheezing or shortness of breath. 1 Inhaler 2  . albuterol (PROVENTIL) (2.5 MG/3ML) 0.083% nebulizer solution Take 3 mLs (2.5 mg total) by nebulization every 6 (six) hours as needed for wheezing or shortness of breath. 150 mL 1  . allopurinol (ZYLOPRIM) 100 MG tablet Take ONE (1) tablet by mouth once daily (Patient taking differently: Take 100 mg by mouth at bedtime. ) 90 tablet 3  . apixaban (ELIQUIS) 5 MG TABS tablet Take 1 tablet (5 mg total) by mouth 2 (two) times daily. 180 tablet 1  . Blood Glucose Monitoring Suppl (GLUCOCOM BLOOD GLUCOSE MONITOR) DEVI Use to check blood sugars 6 times daily E11.65    . DULoxetine (CYMBALTA) 30 MG capsule Take 1 capsule (30 mg total) by mouth at bedtime. 30 capsule 0  . fluticasone (FLONASE) 50 MCG/ACT nasal spray Place 1 spray into both nostrils daily. (Patient taking differently: Place 1 spray into both nostrils at bedtime. Place 1 spray into both nostrils daily.) 16 g 3  . fluticasone (FLOVENT HFA) 110 MCG/ACT inhaler Inhale 2 puffs into the lungs 2 (two) times daily. (Patient taking differently: Inhale 2 puffs into the lungs as needed (bronchitis). ) 1 Inhaler 3  . Insulin Disposable Pump (V-GO 40) KIT Inject into the skin See admin instructions. Use with Novolog - 6 pumps before each meal    . Insulin Pen Needle (B-D ULTRAFINE III SHORT PEN) 31G X 8 MM MISC Use for insulin pen up to five times daily E11.65    . levothyroxine (SYNTHROID, LEVOTHROID) 175 MCG tablet Take 175 mcg by mouth daily before breakfast.    . metoprolol succinate (TOPROL-XL) 25 MG 24 hr tablet Take 1 tablet by mouth daily.    . Prenatal Vit-Fe Fumarate-FA (MULTIVITAMIN-PRENATAL) 27-0.8 MG TABS tablet Take 1 tablet by  mouth daily. Takes for nail and hair growth     . torsemide (DEMADEX) 10 MG tablet Take 1 tablet (10 mg total) by mouth daily.     No current facility-administered medications on file prior to visit.     BP 128/62 (BP Location: Left Arm, Patient Position: Sitting, Cuff Size: Large)   Pulse 60   Temp 97.8 F (36.6 C) (Oral)   Resp 18   Ht 5' 2"  (1.575 m)   Wt 223 lb (101.2 kg)   SpO2 98%   BMI 40.79 kg/m       Objective:   Physical Exam  General- No acute distress. Pleasant patient. Neck- Full range of motion,  no jvd Lungs- Clear, even and unlabored. Heart- regular rate and rhythm. Neurologic- CNII- XII grossly intact.  Rt foot- covered today with sock(last saw wound care 2 days ago)      Assessment & Plan:  For your neuropathic type pain rx low dose lyrica. Will gradually increase maybe up to 75 mg at 3 times daily use. Will try to not write higher dose as you had dizziness in past at higher dose.   Call me in one week and give me update on how you are tolerating. Then decide in one week if will increase neurontin to 50 mg 3 times daily.  Limited rx of OxyContin written today. Can use before your wound care debridement procedures.   Follow up in 3 weeks or as needed

## 2016-06-09 NOTE — Progress Notes (Signed)
Electrophysiology Office Note   Date:  06/09/2016   ID:  SHILPA BUSHEE, DOB 05-17-1950, MRN [id number]  PCP:  Elise Benne  Primary Electrophysiologist:  Kingjames Coury Meredith Leeds, MD    Chief Complaint  Patient presents with  . Pacemaker Check    PAF     History of Present Illness: Alexis [name] is a 66 y.o. female who is being seen today for the evaluation of pacemaker at the request of Saguier, Percell Miller, [place]. Presenting today for electrophysiology evaluation. History of atrial fibrillation, COPD, insulin-dependent diabetes, hypertension, hyperlipidemia, and systolic heart failure per notes, Graves' disease status post radioactive iodine, and a pacemaker. She went in atrial fibrillation with rapid rates during an admission with viral gastroenteritis and transaminitis. She had a Medtronic CRT P implanted 05/23/14 with the RA and RV leads implanted in 2004. She has both RV lead and a His bundle lead. A His bundle lead had a high threshold and thus was turned off at her recent hospitalization, and her RV lead was turned on with a lower rate of 50. She does have persistent long-standing atrial fibrillation and a rate control strategy was determined.    Today, she denies symptoms of palpitations, chest pain, shortness of breath, orthopnea, PND, lower extremity edema, claudication, dizziness, presyncope, syncope, bleeding, or neurologic sequela. The patient is tolerating medications without difficulties. She does have peripheral vascular disease and had necrotic toes and status post atherectomy of her right superficial femoral artery and popliteal artery with drug coated balloon angioplasty of her right leg. She says that her toes are almost healed. She does not notice palpitations. She is unaware she is in atrial fibrillation.   Past Medical History:  Diagnosis Date  . Allergy   . Anxiety   . Arthritis   . Asthma   . Atrial fibrillation ([place])   . CHF (congestive heart failure)  ([place])   . COPD (chronic obstructive pulmonary disease) ([place])   . Diabetes mellitus without complication ([place])   . Hyperlipidemia   . Hypertension   . Myocardial infarction    several  . Pacemaker    CRT-P with RV lead and His Bundle lead ( high threshold)   . Peripheral neuropathy ([place])   . Thyroid disease    Past Surgical History:  Procedure Laterality Date  . ABDOMINAL AORTOGRAM W/LOWER EXTREMITY N/A 04/29/2016   Procedure: Abdominal Aortogram w/Lower Extremity;  Surgeon: Waynetta Sandy, MD;  Location: [facility];  Service: Cardiovascular;  Laterality: N/A;  . PERIPHERAL VASCULAR ATHERECTOMY Right 04/29/2016   Procedure: Peripheral Vascular Atherectomy-Right Popliteal;  Surgeon: Waynetta Sandy, MD;  Location: [facility];  Service: Cardiovascular;  Laterality: Right;  . PERIPHERAL VASCULAR INTERVENTION Right 04/29/2016   Procedure: Peripheral Vascular Intervention-Right Popliteal;  Surgeon: Waynetta Sandy, MD;  Location: [facility];  Service: Cardiovascular;  Laterality: Right;  POPLITEAL PTA  . RADIOFREQUENCY ABLATION    . TOTAL KNEE ARTHROPLASTY       Current Outpatient Prescriptions  Medication Sig Dispense Refill  . acetaminophen (TYLENOL) 325 MG tablet Take 2 tablets (650 mg total) by mouth every 4 (four) hours as needed for headache or mild pain.    Marland Kitchen albuterol (PROVENTIL HFA;VENTOLIN HFA) 108 (90 Base) MCG/ACT inhaler Inhale 2 puffs into the lungs every 6 (six) hours as needed for wheezing or shortness of breath. 1 Inhaler 2  . albuterol (PROVENTIL) (2.5 MG/3ML) 0.083% nebulizer solution Take 3 mLs (2.5 mg total) by nebulization every  6 (six) hours as needed for wheezing or shortness of breath. 150 mL 1  . allopurinol (ZYLOPRIM) 100 MG tablet Take ONE (1) tablet by mouth once daily (Patient taking differently: Take 100 mg by mouth at bedtime. ) 90 tablet 3  . apixaban (ELIQUIS) 5 MG TABS tablet Take 1 tablet (5 mg total) by mouth  2 (two) times daily. 180 tablet 1  . Blood Glucose Monitoring Suppl (GLUCOCOM BLOOD GLUCOSE MONITOR) DEVI Use to check blood sugars 6 times daily E11.65    . DULoxetine (CYMBALTA) 30 MG capsule Take 1 capsule (30 mg total) by mouth at bedtime. 30 capsule 0  . fluticasone (FLONASE) 50 MCG/ACT nasal spray Place 1 spray into both nostrils daily. (Patient taking differently: Place 1 spray into both nostrils at bedtime. Place 1 spray into both nostrils daily.) 16 g 3  . fluticasone (FLOVENT HFA) 110 MCG/ACT inhaler Inhale 2 puffs into the lungs 2 (two) times daily. (Patient taking differently: Inhale 2 puffs into the lungs as needed (bronchitis). ) 1 Inhaler 3  . Insulin Disposable Pump (V-GO 40) KIT Inject into the skin See admin instructions. Use with Novolog - 6 pumps before each meal    . Insulin Pen Needle (B-D ULTRAFINE III SHORT PEN) 31G X 8 MM MISC Use for insulin pen up to five times daily E11.65    . levothyroxine (SYNTHROID, LEVOTHROID) 175 MCG tablet Take 175 mcg by mouth daily before breakfast.    . metoprolol succinate (TOPROL-XL) 25 MG 24 hr tablet Take 1 tablet by mouth daily.    Marland Kitchen oxyCODONE-acetaminophen (ROXICET) 5-325 MG tablet Take 1 tablet by mouth every 6 (six) hours as needed for severe pain. 20 tablet 0  . Prenatal Vit-Fe Fumarate-FA (MULTIVITAMIN-PRENATAL) 27-0.8 MG TABS tablet Take 1 tablet by mouth daily. Takes for nail and hair growth     . torsemide (DEMADEX) 10 MG tablet Take 1 tablet (10 mg total) by mouth daily.     No current facility-administered medications for this visit.     Allergies:   Indomethacin; Pregabalin; and Sulfa antibiotics   Social History:  The patient  reports that she quit smoking about 18 years ago. She has never used smokeless tobacco. She reports that she does not drink alcohol or use drugs.   Family History:  The patient's family history includes Diabetes in her brother, father, and mother; Heart attack in her brother and father; Heart disease in  her father; Hyperlipidemia in her brother, father, and mother.    ROS:  Please see the history of present illness.   Otherwise, review of systems is positive for none.   All other systems are reviewed and negative.    PHYSICAL EXAM: VS:  BP 122/68   Pulse 84   Ht _0  (1.575 m)   Wt 223 lb (101.2 kg)   BMI 40.79 kg/m  , BMI Body mass index is 40.79 kg/m. GEN: Well nourished, well developed, in no acute distress  HEENT: normal  Neck: no JVD, carotid bruits, or masses Cardiac: iRRR; no murmurs, rubs, or gallops,no edema  Respiratory:  clear to auscultation bilaterally, normal work of breathing GI: soft, nontender, nondistended, + BS MS: no deformity or atrophy  Skin: warm and dry,  device pocket is well healed Neuro:  Strength and sensation are intact Psych: euthymic mood, full affect  EKG:  EKG is not ordered today. Personal review of the ekg ordered 04/06/16 shows AF, V paced   Device interrogation is reviewed today in detail.  See PaceArt for details.   Recent Labs: 01/29/2016: Magnesium 1.6 02/10/2016: Pro B Natriuretic peptide (BNP) 60.0 04/06/2016: B Natriuretic Peptide 75.9 04/07/2016: ALT 58 04/28/2016: TSH 25.297 04/29/2016: BUN 24; Potassium 4.3; Sodium 138 05/02/2016: Creatinine, Ser 1.92; Hemoglobin 11.0; Platelets 110    Lipid Panel     Component Value Date/Time   CHOL 184 04/10/2015 1040   TRIG 254.0 (H) 04/10/2015 1040   HDL 33.10 (L) 04/10/2015 1040   CHOLHDL 6 04/10/2015 1040   VLDL 50.8 (H) 04/10/2015 1040   LDLDIRECT 111.0 04/10/2015 1040     Wt Readings from Last 3 Encounters:  06/09/16 223 lb (101.2 kg)  05/26/16 222 lb (100.7 kg)  05/14/16 222 lb (100.7 kg)      Other studies Reviewed: Additional studies/ records that were reviewed today include: TTE 11/26/15  Review of the above records today demonstrates:  - Left ventricle: The cavity size was normal. Wall thickness was   normal. Systolic function was normal. The estimated ejection    fraction was in the range of 50% to 55%. - Left atrium: The atrium was mildly dilated. - Right ventricle: The cavity size was mildly dilated. - Atrial septum: No defect or patent foramen ovale was identified. 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 Device is functioning appropriately. No changes were made to the device today. Continue current management.  2. Persistent long standing atrial fibrillation: on Eliquis. We Etosha Wetherell rate controlled on her device interrogation. No changes necessary today.  This patients CHA2DS2-VASc Score and unadjusted Ischemic Stroke Rate (% per year) is equal to 4.8 % stroke rate/year from a score of 4  Above score calculated as 1 point each if present [CHF, HTN, DM, Vascular=MI/PAD/Aortic Plaque, Age if 65-74, or Female] Above score calculated as 2 points each if present [Age > 75, or Stroke/TIA/TE]   Current medicines are reviewed at length with the patient today.   The patient does not have concerns regarding her medicines.  The following changes were made today:  none  Labs/ tests ordered today include:  No orders of the defined types were placed in this encounter.    Disposition:   FU with Alexis [name] 6 months  Signed, Alexis Alexis Meredith Leeds, MD  06/09/2016 1:57 PM     [address] [place] [address] [phone] (office) [phone] (fax)

## 2016-06-09 NOTE — Patient Instructions (Signed)
Medication Instructions:    Your physician recommends that you continue on your current medications as directed. Please refer to the Current Medication list given to you today.  --- If you need a refill on your cardiac medications before your next appointment, please call your pharmacy. ---  Labwork:  None ordered  Testing/Procedures:  None ordered  Follow-Up: Remote monitoring is used to monitor your Pacemaker of ICD from home. This monitoring reduces the number of office visits required to check your device to one time per year. It allows [place] to keep an eye on the functioning of your device to ensure it is working properly. You are scheduled for a device check from home on 09/08/2016. You may send your transmission at any time that day. If you have a wireless device, the transmission will be sent automatically. After your physician reviews your transmission, you will receive a postcard with your next transmission date.   Your physician wants you to follow-up in: 6 months with Dr. Curt Bears.  You will receive a reminder letter in the mail two months in advance. If you don't receive a letter, please call our office to schedule the follow-up appointment.  Thank you for choosing [facility]!!   Trinidad Curet, RN [phone]

## 2016-06-09 NOTE — Progress Notes (Signed)
Pre visit review using our clinic review tool, if applicable. No additional management support is needed unless otherwise documented below in the visit note. 

## 2016-06-11 LAB — AEROBIC CULTURE W GRAM STAIN (SUPERFICIAL SPECIMEN)

## 2016-06-11 LAB — AEROBIC CULTURE  (SUPERFICIAL SPECIMEN)

## 2016-06-14 DIAGNOSIS — L97511 Non-pressure chronic ulcer of other part of right foot limited to breakdown of skin: Secondary | ICD-10-CM | POA: Diagnosis not present

## 2016-06-14 DIAGNOSIS — I70235 Atherosclerosis of native arteries of right leg with ulceration of other part of foot: Secondary | ICD-10-CM | POA: Diagnosis not present

## 2016-06-14 DIAGNOSIS — B9689 Other specified bacterial agents as the cause of diseases classified elsewhere: Secondary | ICD-10-CM | POA: Diagnosis not present

## 2016-06-14 DIAGNOSIS — I10 Essential (primary) hypertension: Secondary | ICD-10-CM | POA: Diagnosis not present

## 2016-06-14 DIAGNOSIS — L97512 Non-pressure chronic ulcer of other part of right foot with fat layer exposed: Secondary | ICD-10-CM | POA: Diagnosis not present

## 2016-06-14 DIAGNOSIS — B9561 Methicillin susceptible Staphylococcus aureus infection as the cause of diseases classified elsewhere: Secondary | ICD-10-CM | POA: Diagnosis not present

## 2016-06-14 DIAGNOSIS — E1151 Type 2 diabetes mellitus with diabetic peripheral angiopathy without gangrene: Secondary | ICD-10-CM | POA: Diagnosis not present

## 2016-06-14 DIAGNOSIS — A4901 Methicillin susceptible Staphylococcus aureus infection, unspecified site: Secondary | ICD-10-CM | POA: Diagnosis not present

## 2016-06-14 DIAGNOSIS — B957 Other staphylococcus as the cause of diseases classified elsewhere: Secondary | ICD-10-CM | POA: Diagnosis not present

## 2016-06-14 DIAGNOSIS — Z794 Long term (current) use of insulin: Secondary | ICD-10-CM | POA: Diagnosis not present

## 2016-06-14 DIAGNOSIS — E11621 Type 2 diabetes mellitus with foot ulcer: Secondary | ICD-10-CM | POA: Diagnosis not present

## 2016-06-14 DIAGNOSIS — E1142 Type 2 diabetes mellitus with diabetic polyneuropathy: Secondary | ICD-10-CM | POA: Diagnosis not present

## 2016-06-15 ENCOUNTER — Telehealth: Payer: Self-pay | Admitting: Medical

## 2016-06-15 NOTE — Telephone Encounter (Signed)
Caller name: Relationship to patient: Self Can be reached: [phone]  Pharmacy:  [facility], [place] - [address] [phone] (Phone) [phone] (Fax)     Reason for call: Called to inform provider that she is seeing a difference with taking the Lyrica. Request that provider call in the increased dose for her.

## 2016-06-16 DIAGNOSIS — L97413 Non-pressure chronic ulcer of right heel and midfoot with necrosis of muscle: Secondary | ICD-10-CM | POA: Diagnosis not present

## 2016-06-16 DIAGNOSIS — J449 Chronic obstructive pulmonary disease, unspecified: Secondary | ICD-10-CM | POA: Diagnosis not present

## 2016-06-16 DIAGNOSIS — I10 Essential (primary) hypertension: Secondary | ICD-10-CM | POA: Diagnosis not present

## 2016-06-16 DIAGNOSIS — L97513 Non-pressure chronic ulcer of other part of right foot with necrosis of muscle: Secondary | ICD-10-CM | POA: Diagnosis not present

## 2016-06-16 DIAGNOSIS — E11621 Type 2 diabetes mellitus with foot ulcer: Secondary | ICD-10-CM | POA: Diagnosis not present

## 2016-06-16 DIAGNOSIS — I4891 Unspecified atrial fibrillation: Secondary | ICD-10-CM | POA: Diagnosis not present

## 2016-06-16 DIAGNOSIS — E785 Hyperlipidemia, unspecified: Secondary | ICD-10-CM | POA: Diagnosis not present

## 2016-06-16 DIAGNOSIS — I252 Old myocardial infarction: Secondary | ICD-10-CM | POA: Diagnosis not present

## 2016-06-16 DIAGNOSIS — E1152 Type 2 diabetes mellitus with diabetic peripheral angiopathy with gangrene: Secondary | ICD-10-CM | POA: Diagnosis not present

## 2016-06-16 MED ORDER — PREGABALIN 50 MG PO CAPS: 50.0000 mg | ORAL_CAPSULE | Freq: Three times a day (TID) | ORAL | 0 refills | Status: DC

## 2016-06-16 NOTE — Telephone Encounter (Signed)
rx higher dose lyrica sent to pharmacy update [place] in one week if that dose is adequate or not?

## 2016-06-16 NOTE — Telephone Encounter (Signed)
Rx faxed to [facility], [place] - [address] [phone] (Fax)

## 2016-06-17 ENCOUNTER — Ambulatory Visit: Payer: Medicare HMO | Admitting: Interventional Cardiology

## 2016-06-18 DIAGNOSIS — Z794 Long term (current) use of insulin: Secondary | ICD-10-CM | POA: Diagnosis not present

## 2016-06-18 DIAGNOSIS — E1142 Type 2 diabetes mellitus with diabetic polyneuropathy: Secondary | ICD-10-CM | POA: Diagnosis not present

## 2016-06-18 DIAGNOSIS — E782 Mixed hyperlipidemia: Secondary | ICD-10-CM | POA: Diagnosis not present

## 2016-06-18 DIAGNOSIS — E039 Hypothyroidism, unspecified: Secondary | ICD-10-CM | POA: Diagnosis not present

## 2016-06-18 DIAGNOSIS — I10 Essential (primary) hypertension: Secondary | ICD-10-CM | POA: Diagnosis not present

## 2016-06-20 ENCOUNTER — Encounter: Payer: Self-pay | Admitting: Medical

## 2016-06-21 DIAGNOSIS — Z794 Long term (current) use of insulin: Secondary | ICD-10-CM | POA: Diagnosis not present

## 2016-06-21 DIAGNOSIS — L97512 Non-pressure chronic ulcer of other part of right foot with fat layer exposed: Secondary | ICD-10-CM | POA: Diagnosis not present

## 2016-06-21 DIAGNOSIS — A4901 Methicillin susceptible Staphylococcus aureus infection, unspecified site: Secondary | ICD-10-CM | POA: Diagnosis not present

## 2016-06-21 DIAGNOSIS — E1142 Type 2 diabetes mellitus with diabetic polyneuropathy: Secondary | ICD-10-CM | POA: Diagnosis not present

## 2016-06-21 DIAGNOSIS — E1151 Type 2 diabetes mellitus with diabetic peripheral angiopathy without gangrene: Secondary | ICD-10-CM | POA: Diagnosis not present

## 2016-06-21 DIAGNOSIS — L97511 Non-pressure chronic ulcer of other part of right foot limited to breakdown of skin: Secondary | ICD-10-CM | POA: Diagnosis not present

## 2016-06-21 DIAGNOSIS — I10 Essential (primary) hypertension: Secondary | ICD-10-CM | POA: Diagnosis not present

## 2016-06-21 DIAGNOSIS — E11621 Type 2 diabetes mellitus with foot ulcer: Secondary | ICD-10-CM | POA: Diagnosis not present

## 2016-06-22 ENCOUNTER — Encounter: Payer: Self-pay | Admitting: Medical

## 2016-06-23 DIAGNOSIS — E11621 Type 2 diabetes mellitus with foot ulcer: Secondary | ICD-10-CM | POA: Diagnosis not present

## 2016-06-23 DIAGNOSIS — L97513 Non-pressure chronic ulcer of other part of right foot with necrosis of muscle: Secondary | ICD-10-CM | POA: Diagnosis not present

## 2016-06-23 DIAGNOSIS — I10 Essential (primary) hypertension: Secondary | ICD-10-CM | POA: Diagnosis not present

## 2016-06-23 DIAGNOSIS — I252 Old myocardial infarction: Secondary | ICD-10-CM | POA: Diagnosis not present

## 2016-06-23 DIAGNOSIS — E1152 Type 2 diabetes mellitus with diabetic peripheral angiopathy with gangrene: Secondary | ICD-10-CM | POA: Diagnosis not present

## 2016-06-23 DIAGNOSIS — E785 Hyperlipidemia, unspecified: Secondary | ICD-10-CM | POA: Diagnosis not present

## 2016-06-23 DIAGNOSIS — L97413 Non-pressure chronic ulcer of right heel and midfoot with necrosis of muscle: Secondary | ICD-10-CM | POA: Diagnosis not present

## 2016-06-23 DIAGNOSIS — J449 Chronic obstructive pulmonary disease, unspecified: Secondary | ICD-10-CM | POA: Diagnosis not present

## 2016-06-23 DIAGNOSIS — I4891 Unspecified atrial fibrillation: Secondary | ICD-10-CM | POA: Diagnosis not present

## 2016-06-28 DIAGNOSIS — E1151 Type 2 diabetes mellitus with diabetic peripheral angiopathy without gangrene: Secondary | ICD-10-CM | POA: Diagnosis not present

## 2016-06-28 DIAGNOSIS — L97511 Non-pressure chronic ulcer of other part of right foot limited to breakdown of skin: Secondary | ICD-10-CM | POA: Diagnosis not present

## 2016-06-28 DIAGNOSIS — L97512 Non-pressure chronic ulcer of other part of right foot with fat layer exposed: Secondary | ICD-10-CM | POA: Diagnosis not present

## 2016-06-28 DIAGNOSIS — Z794 Long term (current) use of insulin: Secondary | ICD-10-CM | POA: Diagnosis not present

## 2016-06-28 DIAGNOSIS — E11621 Type 2 diabetes mellitus with foot ulcer: Secondary | ICD-10-CM | POA: Diagnosis not present

## 2016-06-28 DIAGNOSIS — A4901 Methicillin susceptible Staphylococcus aureus infection, unspecified site: Secondary | ICD-10-CM | POA: Diagnosis not present

## 2016-06-28 DIAGNOSIS — I10 Essential (primary) hypertension: Secondary | ICD-10-CM | POA: Diagnosis not present

## 2016-06-28 DIAGNOSIS — E1142 Type 2 diabetes mellitus with diabetic polyneuropathy: Secondary | ICD-10-CM | POA: Diagnosis not present

## 2016-06-29 ENCOUNTER — Telehealth: Payer: Self-pay | Admitting: *Deleted

## 2016-06-29 NOTE — Progress Notes (Signed)
Pre visit review using our clinic review tool, if applicable. No additional management support is needed unless otherwise documented below in the visit note. 

## 2016-06-29 NOTE — Progress Notes (Addendum)
Subjective:   Alexis [name] is a 66 y.o. female who presents for Medicare Annual (Subsequent) preventive examination.   Review of Systems:  No ROS.  Medicare Wellness Visit. Cardiac Risk Factors include: advanced age (>44mn, >>93women);diabetes mellitus;dyslipidemia;hypertension;obesity (BMI >30kg/m2);sedentary lifestyle Sleep patterns: Sleeps well 8-10 hrs. Takes Melatonin. Feels rested. Home Safety/Smoke Alarms:  Feels safe in home. Smoke alarms in place.  Living environment; residence and Firearm Safety: Lives with husband and dogs. 1 story. No guns Seat Belt Safety/Bike Helmet: Wears seat belt.   Counseling:   Eye Exam- Wears glasses. Yearly diabetic eye exams. Dental- Upper dentures. Doesn't wear lower dentures. Gum care discussed.  Female:   Pap-  Hysterectomy.     Mammo- Declines.    Dexa scan-  Ordered today.       CCS- Declines    Objective:     Vitals: BP 140/82 (BP Location: Right Arm, Patient Position: Sitting, Cuff Size: Large)   Pulse 70   Ht 5' 2" (1.575 m)   Wt 225 lb (102.1 kg)   SpO2 98%   BMI 41.15 kg/m   Body mass index is 41.15 kg/m.   Tobacco History  Smoking Status  . Former Smoker  . Quit date: 03/01/1998  Smokeless Tobacco  . Never Used     Counseling given: No   Past Medical History:  Diagnosis Date  . Allergy   . Anxiety   . Arthritis   . Asthma   . Atrial fibrillation ([place]   . CHF (congestive heart failure) ([facility]   . COPD (chronic obstructive pulmonary disease) ([facility]   . Diabetes mellitus without complication ([facility]   . Hyperlipidemia   . Hypertension   . [place]ocardial infarction ([facility]    several  . Pacemaker    CRT-P with RV lead and His Bundle lead ( high threshold)   . Peripheral neuropathy   . Thyroid disease    Past Surgical History:  Procedure Laterality Date  . ABDOMINAL AORTOGRAM W/LOWER EXTREMITY N/A 04/29/2016   Procedure: Abdominal Aortogram w/Lower Extremity;  Surgeon: BWaynetta Sandy MD;  [facility];  Service: Cardiovascular;  Laterality: N/A;  . PERIPHERAL VASCULAR ATHERECTOMY Right 04/29/2016   Procedure: Peripheral Vascular Atherectomy-Right Popliteal;  Surgeon: BWaynetta Sandy MD;  [facility];  Service: Cardiovascular;  Laterality: Right;  . PERIPHERAL VASCULAR INTERVENTION Right 04/29/2016   Procedure: Peripheral Vascular Intervention-Right Popliteal;  Surgeon: BWaynetta Sandy MD;  [facility];  Service: Cardiovascular;  Laterality: Right;  POPLITEAL PTA  . RADIOFREQUENCY ABLATION    . TOTAL KNEE ARTHROPLASTY     Family History  Problem Relation Age of Onset  . Diabetes Mother   . Hyperlipidemia Mother   . Diabetes Father   . Hyperlipidemia Father   . Heart disease Father     before age 68 . Heart attack Father   . Diabetes Brother   . Hyperlipidemia Brother   . Heart attack Brother    History  Sexual Activity  . Sexual activity: No    Outpatient Encounter Prescriptions as of 06/30/2016  Medication Sig  . acetaminophen (TYLENOL) 325 MG tablet Take 2 tablets (650 mg total) by mouth every 4 (four) hours as needed for headache or mild pain.  .Marland Kitchenalbuterol (PROVENTIL HFA;VENTOLIN HFA) 108 (90 Base) MCG/ACT inhaler Inhale 2 puffs into the lungs every 6 (six) hours as needed for wheezing or shortness of breath.  . allopurinol (ZYLOPRIM) 100 MG tablet  Take ONE (1) tablet by mouth once daily (Patient taking differently: Take 100 mg by mouth at bedtime. )  . apixaban (ELIQUIS) 5 MG TABS tablet Take 1 tablet (5 mg total) by mouth 2 (two) times daily.  . Blood Glucose Monitoring Suppl (GLUCOCOM BLOOD GLUCOSE MONITOR) DEVI Use to check blood sugars 6 times daily E11.65  . DULoxetine (CYMBALTA) 30 MG capsule Take 1 capsule (30 mg total) by mouth at bedtime.  . fluticasone (FLONASE) 50 MCG/ACT nasal spray Place 1 spray into both nostrils daily. (Patient taking differently: Place 1 spray into both nostrils at bedtime.  Place 1 spray into both nostrils daily.)  . fluticasone (FLOVENT HFA) 110 MCG/ACT inhaler Inhale 2 puffs into the lungs 2 (two) times daily. (Patient taking differently: Inhale 2 puffs into the lungs as needed (bronchitis). )  . Insulin Disposable Pump (V-GO 40) KIT Inject into the skin See admin instructions. Use with Novolog - 6 pumps before each meal  . Insulin Pen Needle (B-D ULTRAFINE III SHORT PEN) 31G X 8 MM MISC Use for insulin pen up to five times daily E11.65  . levothyroxine (SYNTHROID, LEVOTHROID) 175 MCG tablet Take 175 mcg by mouth daily before breakfast.  . metoprolol succinate (TOPROL-XL) 25 MG 24 hr tablet Take 1 tablet by mouth daily.  Marland Kitchen oxyCODONE-acetaminophen (ROXICET) 5-325 MG tablet Take 1 tablet by mouth every 6 (six) hours as needed for severe pain.  . Prenatal Vit-Fe Fumarate-FA (MULTIVITAMIN-PRENATAL) 27-0.8 MG TABS tablet Take 1 tablet by mouth daily. Takes for nail and hair growth   . torsemide (DEMADEX) 10 MG tablet Take 1 tablet (10 mg total) by mouth daily.  Marland Kitchen albuterol (PROVENTIL) (2.5 MG/3ML) 0.083% nebulizer solution Take 3 mLs (2.5 mg total) by nebulization every 6 (six) hours as needed for wheezing or shortness of breath.  . pregabalin (LYRICA) 50 MG capsule Take 1 capsule (50 mg total) by mouth 3 (three) times daily. (Patient not taking: Reported on 06/30/2016)   No facility-administered encounter medications on file as of 06/30/2016.     Activities of Daily Living In your present state of health, do you have any difficulty performing the following activities: 06/30/2016 04/28/2016  Hearing? Y N  Vision? N N  Difficulty concentrating or making decisions? N N  Walking or climbing stairs? Y N  Dressing or bathing? N N  Doing errands, shopping? N N  Preparing Food and eating ? N -  Using the Toilet? N -  In the past six months, have you accidently leaked urine? N -  Do you have problems with loss of bowel control? N -  Managing your Medications? N -  Managing  your Finances? N -  Housekeeping or managing your Housekeeping? N -  Some recent data might be hidden    Patient Care Team: Mackie Pai, PA-C as PCP - General (Physician Assistant) Karl Luke, MD as Referring Physician (Neurology) [name] Dupes, FNP as Referring Physician (Endocrinology) Mahala Menghini, MD as Referring Physician (Cardiology) Angelica Ran, OD as Referring Physician (Optometry)    Assessment:    Physical assessment deferred to PCP.  Exercise Activities and Dietary recommendations Current Exercise Habits: The patient does not participate in regular exercise at present Diet (meal preparation, eat out, water intake, caffeinated beverages, dairy products, fruits and vegetables): in general, a "healthy" diet     Goals    . Weight < 200 lb (90.719 kg)      Fall Risk Fall Risk  06/30/2016 03/27/2015 12/02/2014  Falls in the  past year? No Yes No  Number falls in past yr: - 2 or more -  Injury with Fall? - Yes -  Risk Factor Category  - High Fall Risk -  Risk for fall due to : - Impaired mobility -  Risk for fall due to (comments): - Left knee goes out.   -  Follow up - Education provided;Falls prevention discussed -   Depression Screen PHQ 2/9 Scores 06/30/2016 03/27/2015 12/02/2014  PHQ - 2 Score 0 1 0     Cognitive Function Ad8 score reviewed for issues:  noIssues making decisions:no  Less interest in hobbies / activities:no  Repeats questions, stories (family complaining):no  Trouble using ordinary gadgets (microwave, computer, phone):no  Forgets the month or year: no  Mismanaging finances: no  Remembering appts:no  Daily problems with thinking and/or memory:no Ad8 score is=0     MMSE - Mini Mental State Exam 03/27/2015  Orientation to time 5  Orientation to Place 5  Registration 3  Attention/ Calculation 5  Recall 1  Language- name 2 objects 2  Language- repeat 1  Language- follow 3 step command 3  Language- read & follow direction 1    Write a sentence 1  Copy design 1  Total score 28        Immunization History  Administered Date(s) Administered  . Influenza Split 01/05/2007, 12/23/2010, 01/03/2012, 05/11/2012  . Influenza, High Dose Seasonal PF 01/09/2015, 12/24/2015  . Influenza,inj,Quad PF,36+ Mos 12/24/2013, 01/09/2015  . Influenza-Unspecified 01/05/2007, 12/23/2010, 01/03/2012, 05/11/2012  . Pneumococcal Conjugate-13 01/09/2015  . Pneumococcal Polysaccharide-23 05/11/2012  . Td 12/23/2010  . Tdap 12/23/2010   Screening Tests Health Maintenance  Topic Date Due  . Hepatitis C Screening  [birth year]  . MAMMOGRAM  02/11/1999  . COLONOSCOPY  02/11/1999  . DEXA SCAN  02/10/2014  . OPHTHALMOLOGY EXAM  03/02/2015  . HEMOGLOBIN A1C  10/01/2015  . FOOT EXAM  04/09/2016  . URINE MICROALBUMIN  04/09/2016  . INFLUENZA VACCINE  09/29/2016  . PNA vac Low Risk Adult (2 of 2 - PPSV23) 05/11/2017  . TETANUS/TDAP  12/22/2020      Plan:   Follow up with PCP today as scheduled.  Continue to eat heart healthy diet (full of fruits, vegetables, whole grains, lean protein, water--limit salt, fat, and sugar intake) and increase physical activity as tolerated.  Continue doing brain stimulating activities (puzzles, reading, adult coloring books, staying active) to keep memory sharp.   Schedule bone density scan. Order placed today.  Bring a copy of your advance directives to your next office visit.  I have personally reviewed and noted the following in the patient's chart:   . Medical and social history . Use of alcohol, tobacco or illicit drugs  . Current medications and supplements . Functional ability and status . Nutritional status . Physical activity . Advanced directives . List of other physicians . Hospitalizations, surgeries, and ER visits in previous 12 months . Vitals . Screenings to include cognitive, depression, and falls . Referrals and appointments  In addition, I have reviewed and discussed  with patient certain preventive protocols, quality metrics, and best practice recommendations. A written personalized care plan for preventive services as well as general preventive health recommendations were provided to patient.     Shela Nevin, [place]  06/30/2016   I have reviewed and agree with  assessment and  plan of RN performing Wellness exam.  Saguier, Percell Miller, PA-C  Pt in for evaluation. She update me still has nerve pain.  She could not tolerate lyrica. Made her legs swell and she wheezed. So she stopped. Pt tried cymbalta one tablet.If takes 2 will make her nausea. Neurontin memory loss.  Pt never tried elavil. Pt may have debridement type procedure on her rt foot. She may get amputation in near future.  I rx'd elavil for nerve pain/neuropathy.Oxycodone brief rx for post foot procedure pain.   Note not charging separate visit. Only charging wellness.  Pt request scooter. [phone].  Saguier, Percell Miller, PA-C

## 2016-06-29 NOTE — Telephone Encounter (Signed)
AWV scheduled for 06/30/16 @8 

## 2016-06-30 ENCOUNTER — Ambulatory Visit (HOSPITAL_BASED_OUTPATIENT_CLINIC_OR_DEPARTMENT_OTHER)
Admission: RE | Admit: 2016-06-30 | Discharge: 2016-06-30 | Disposition: A | Discharge status: Home / Self Care | Payer: Medicare HMO | Source: Ambulatory Visit | Attending: Internal Medicine | Admitting: Internal Medicine

## 2016-06-30 ENCOUNTER — Encounter: Payer: Self-pay | Admitting: Medical

## 2016-06-30 ENCOUNTER — Telehealth: Payer: Self-pay | Admitting: Medical

## 2016-06-30 ENCOUNTER — Other Ambulatory Visit: Payer: Self-pay | Admitting: Internal Medicine

## 2016-06-30 ENCOUNTER — Ambulatory Visit (INDEPENDENT_AMBULATORY_CARE_PROVIDER_SITE_OTHER): Payer: Medicare HMO | Admitting: Medical

## 2016-06-30 ENCOUNTER — Ambulatory Visit: Payer: Medicare HMO | Admitting: Medical

## 2016-06-30 VITALS — BP 130/70 | HR 70 | Ht 62.0 in | Wt 225.0 lb

## 2016-06-30 DIAGNOSIS — S92511A Displaced fracture of proximal phalanx of right lesser toe(s), initial encounter for closed fracture: Secondary | ICD-10-CM | POA: Insufficient documentation

## 2016-06-30 DIAGNOSIS — I4891 Unspecified atrial fibrillation: Secondary | ICD-10-CM | POA: Diagnosis not present

## 2016-06-30 DIAGNOSIS — E1152 Type 2 diabetes mellitus with diabetic peripheral angiopathy with gangrene: Secondary | ICD-10-CM | POA: Diagnosis not present

## 2016-06-30 DIAGNOSIS — E11621 Type 2 diabetes mellitus with foot ulcer: Secondary | ICD-10-CM

## 2016-06-30 DIAGNOSIS — L97413 Non-pressure chronic ulcer of right heel and midfoot with necrosis of muscle: Secondary | ICD-10-CM | POA: Diagnosis not present

## 2016-06-30 DIAGNOSIS — X58XXXA Exposure to other specified factors, initial encounter: Secondary | ICD-10-CM | POA: Insufficient documentation

## 2016-06-30 DIAGNOSIS — S62610A Displaced fracture of proximal phalanx of right index finger, initial encounter for closed fracture: Secondary | ICD-10-CM | POA: Diagnosis not present

## 2016-06-30 DIAGNOSIS — L97519 Non-pressure chronic ulcer of other part of right foot with unspecified severity: Secondary | ICD-10-CM

## 2016-06-30 DIAGNOSIS — E785 Hyperlipidemia, unspecified: Secondary | ICD-10-CM | POA: Diagnosis not present

## 2016-06-30 DIAGNOSIS — L97513 Non-pressure chronic ulcer of other part of right foot with necrosis of muscle: Secondary | ICD-10-CM | POA: Diagnosis not present

## 2016-06-30 DIAGNOSIS — Z780 Asymptomatic menopausal state: Secondary | ICD-10-CM | POA: Diagnosis not present

## 2016-06-30 DIAGNOSIS — I10 Essential (primary) hypertension: Secondary | ICD-10-CM | POA: Diagnosis not present

## 2016-06-30 DIAGNOSIS — I252 Old myocardial infarction: Secondary | ICD-10-CM | POA: Diagnosis not present

## 2016-06-30 DIAGNOSIS — Z0000 Encounter for general adult medical examination without abnormal findings: Secondary | ICD-10-CM | POA: Diagnosis not present

## 2016-06-30 DIAGNOSIS — J449 Chronic obstructive pulmonary disease, unspecified: Secondary | ICD-10-CM | POA: Diagnosis not present

## 2016-06-30 DIAGNOSIS — M7989 Other specified soft tissue disorders: Secondary | ICD-10-CM | POA: Diagnosis not present

## 2016-06-30 DIAGNOSIS — S62617A Displaced fracture of proximal phalanx of left little finger, initial encounter for closed fracture: Secondary | ICD-10-CM | POA: Diagnosis not present

## 2016-06-30 MED ORDER — OXYCODONE-ACETAMINOPHEN 5-325 MG PO TABS: 1.0000 | ORAL_TABLET | Freq: Four times a day (QID) | ORAL | 0 refills | Status: DC | PRN

## 2016-06-30 MED ORDER — AMITRIPTYLINE HCL 25 MG PO TABS: 25.0000 mg | ORAL_TABLET | Freq: Every day | ORAL | 0 refills | Status: DC

## 2016-06-30 NOTE — Telephone Encounter (Signed)
Pt request a scooter. She gave number of store. She may qualify. Would you call this number and ask for steps to for her to get application etc. If I recall correctly a lot of steps. Involved.  Pt request scooter. [phone].

## 2016-06-30 NOTE — Patient Instructions (Signed)
Alexis [name] , Thank you for taking time to come for your Medicare Wellness Visit. I appreciate your ongoing commitment to your health goals. Please review the following plan we discussed and let me know if I can assist you in the future.   These are the goals we discussed: Goals    . Weight < 200 lb (90.719 kg)       This is a list of the screening recommended for you and due dates:  Health Maintenance  Topic Date Due  .  Hepatitis C: One time screening is recommended by Center for Disease Control  (CDC) for  adults born from [birth year] through 1967.   16-Feb-1951  . DEXA scan (bone density measurement)  02/10/2014  . Eye exam for diabetics  03/02/2015  . Hemoglobin A1C  10/01/2015  . Urine Protein Check  04/09/2016  . Mammogram  06/30/2017*  . Colon Cancer Screening  06/30/2017*  . Flu Shot  09/29/2016  . Pneumonia vaccines (2 of 2 - PPSV23) 05/11/2017  . Complete foot exam   06/28/2017  . Tetanus Vaccine  12/22/2020  *Topic was postponed. The date shown is not the original due date.   Continue to eat heart healthy diet (full of fruits, vegetables, whole grains, lean protein, water--limit salt, fat, and sugar intake) and increase physical activity as tolerated.  Continue doing brain stimulating activities (puzzles, reading, adult coloring books, staying active) to keep memory sharp.   Schedule bone density scan. Order placed today.  Bring a copy of your advance directives to your next office visit. Health Maintenance for Postmenopausal Women Menopause is a normal process in which your reproductive ability comes to an end. This process happens gradually over a span of months to years, usually between the ages of 84 and 25. Menopause is complete when you have missed 12 consecutive menstrual periods. It is important to talk with your health care provider about some of the most common conditions that affect postmenopausal women, such as heart disease, cancer, and bone loss (osteoporosis).  Adopting a healthy lifestyle and getting preventive care can help to promote your health and wellness. Those actions can also lower your chances of developing some of these common conditions. What should I know about menopause? During menopause, you may experience a number of symptoms, such as:  Moderate-to-severe hot flashes.  Night sweats.  Decrease in sex drive.  Mood swings.  Headaches.  Tiredness.  Irritability.  Memory problems.  Insomnia. Choosing to treat or not to treat menopausal changes is an individual decision that you make with your health care provider. What should I know about hormone replacement therapy and supplements? Hormone therapy products are effective for treating symptoms that are associated with menopause, such as hot flashes and night sweats. Hormone replacement carries certain risks, especially as you become older. If you are thinking about using estrogen or estrogen with progestin treatments, discuss the benefits and risks with your health care provider. What should I know about heart disease and stroke? Heart disease, heart attack, and stroke become more likely as you age. This may be due, in part, to the hormonal changes that your body experiences during menopause. These can affect how your body processes dietary fats, triglycerides, and cholesterol. Heart attack and stroke are both medical emergencies. There are many things that you can do to help prevent heart disease and stroke:  Have your blood pressure checked at least every 1-2 years. High blood pressure causes heart disease and increases the risk of stroke.  If you are 51-57 years old, ask your health care provider if you should take aspirin to prevent a heart attack or a stroke.  Do not use any tobacco products, including cigarettes, chewing tobacco, or electronic cigarettes. If you need help quitting, ask your health care provider.  It is important to eat a healthy diet and maintain a healthy  weight.  Be sure to include plenty of vegetables, fruits, low-fat dairy products, and lean protein.  Avoid eating foods that are high in solid fats, added sugars, or salt (sodium).  Get regular exercise. This is one of the most important things that you can do for your health.  Try to exercise for at least 150 minutes each week. The type of exercise that you do should increase your heart rate and make you sweat. This is known as moderate-intensity exercise.  Try to do strengthening exercises at least twice each week. Do these in addition to the moderate-intensity exercise.  Know your numbers.Ask your health care provider to check your cholesterol and your blood glucose. Continue to have your blood tested as directed by your health care provider. What should I know about cancer screening? There are several types of cancer. Take the following steps to reduce your risk and to catch any cancer development as early as possible. Breast Cancer  Practice breast self-awareness.  This means understanding how your breasts normally appear and feel.  It also means doing regular breast self-exams. Let your health care provider know about any changes, no matter how small.  If you are 64 or older, have a clinician do a breast exam (clinical breast exam or CBE) every year. Depending on your age, family history, and medical history, it may be recommended that you also have a yearly breast X-ray (mammogram).  If you have a family history of breast cancer, talk with your health care provider about genetic screening.  If you are at high risk for breast cancer, talk with your health care provider about having an MRI and a mammogram every year.  Breast cancer (BRCA) gene test is recommended for women who have family members with BRCA-related cancers. Results of the assessment will determine the need for genetic counseling and BRCA1 and for BRCA2 testing. BRCA-related cancers include these types:  Breast.  This occurs in males or females.  Ovarian.  Tubal. This may also be called fallopian tube cancer.  Cancer of the abdominal or pelvic lining (peritoneal cancer).  Prostate.  Pancreatic. Cervical, Uterine, and Ovarian Cancer  Your health care provider may recommend that you be screened regularly for cancer of the pelvic organs. These include your ovaries, uterus, and vagina. This screening involves a pelvic exam, which includes checking for microscopic changes to the surface of your cervix (Pap test).  For women ages 21-65, health care providers may recommend a pelvic exam and a Pap test every three years. For women ages 62-65, they may recommend the Pap test and pelvic exam, combined with testing for human papilloma virus (HPV), every five years. Some types of HPV increase your risk of cervical cancer. Testing for HPV may also be done on women of any age who have unclear Pap test results.  Other health care providers may not recommend any screening for nonpregnant women who are considered low risk for pelvic cancer and have no symptoms. Ask your health care provider if a screening pelvic exam is right for you.  If you have had past treatment for cervical cancer or a condition that could lead  to cancer, you need Pap tests and screening for cancer for at least 20 years after your treatment. If Pap tests have been discontinued for you, your risk factors (such as having a new sexual partner) need to be reassessed to determine if you should start having screenings again. Some women have medical problems that increase the chance of getting cervical cancer. In these cases, your health care provider may recommend that you have screening and Pap tests more often.  If you have a family history of uterine cancer or ovarian cancer, talk with your health care provider about genetic screening.  If you have vaginal bleeding after reaching menopause, tell your health care provider.  There are currently no  reliable tests available to screen for ovarian cancer. Lung Cancer  Lung cancer screening is recommended for adults 80-30 years old who are at high risk for lung cancer because of a history of smoking. A yearly low-dose CT scan of the lungs is recommended if you:  Currently smoke.  Have a history of at least 30 pack-years of smoking and you currently smoke or have quit within the past 15 years. A pack-year is smoking an average of one pack of cigarettes per day for one year. Yearly screening should:  Continue until it has been 15 years since you quit.  Stop if you develop a health problem that would prevent you from having lung cancer treatment. Colorectal Cancer  This type of cancer can be detected and can often be prevented.  Routine colorectal cancer screening usually begins at age 102 and continues through age 38.  If you have risk factors for colon cancer, your health care provider may recommend that you be screened at an earlier age.  If you have a family history of colorectal cancer, talk with your health care provider about genetic screening.  Your health care provider may also recommend using home test kits to check for hidden blood in your stool.  A small camera at the end of a tube can be used to examine your colon directly (sigmoidoscopy or colonoscopy). This is done to check for the earliest forms of colorectal cancer.  Direct examination of the colon should be repeated every 5-10 years until age 84. However, if early forms of precancerous polyps or small growths are found or if you have a family history or genetic risk for colorectal cancer, you may need to be screened more often. Skin Cancer  Check your skin from head to toe regularly.  Monitor any moles. Be sure to tell your health care provider:  About any new moles or changes in moles, especially if there is a change in a mole's shape or color.  If you have a mole that is larger than the size of a pencil  eraser.  If any of your family members has a history of skin cancer, especially at a young age, talk with your health care provider about genetic screening.  Always use sunscreen. Apply sunscreen liberally and repeatedly throughout the day.  Whenever you are outside, protect yourself by wearing long sleeves, pants, a wide-brimmed hat, and sunglasses. What should I know about osteoporosis? Osteoporosis is a condition in which bone destruction happens more quickly than new bone creation. After menopause, you may be at an increased risk for osteoporosis. To help prevent osteoporosis or the bone fractures that can happen because of osteoporosis, the following is recommended:  If you are 65-5 years old, get at least 1,000 mg of calcium and at least 600  mg of vitamin D per day.  If you are older than age 44 but younger than age 33, get at least 1,200 mg of calcium and at least 600 mg of vitamin D per day.  If you are older than age 65, get at least 1,200 mg of calcium and at least 800 mg of vitamin D per day. Smoking and excessive alcohol intake increase the risk of osteoporosis. Eat foods that are rich in calcium and vitamin D, and do weight-bearing exercises several times each week as directed by your health care provider. What should I know about how menopause affects my mental health? Depression may occur at any age, but it is more common as you become older. Common symptoms of depression include:  Low or sad mood.  Changes in sleep patterns.  Changes in appetite or eating patterns.  Feeling an overall lack of motivation or enjoyment of activities that you previously enjoyed.  Frequent crying spells. Talk with your health care provider if you think that you are experiencing depression. What should I know about immunizations? It is important that you get and maintain your immunizations. These include:  Tetanus, diphtheria, and pertussis (Tdap) booster vaccine.  Influenza every year  before the flu season begins.  Pneumonia vaccine.  Shingles vaccine. Your health care provider may also recommend other immunizations. This information is not intended to replace advice given to you by your health care provider. Make sure you discuss any questions you have with your health care provider. Document Released: 04/09/2005 Document Revised: 09/05/2015 Document Reviewed: 11/19/2014 Elsevier Interactive Patient Education  2017 [employer].

## 2016-07-01 ENCOUNTER — Telehealth: Payer: Self-pay

## 2016-07-01 NOTE — Telephone Encounter (Signed)
rec'd phone call from pt.  Reported she continues to go to the [facility].  Reported Dr. Dellia Nims has told her the pulse in her right LE is "still not good."  Stated he has recently debrided her right 2nd toe, and stated if it doesn't heal, it may need to be amputated.  Stated the toe is "floppy."  Denied any drainage from right 2nd toe.  Denied fever/ chills.  Is requesting an appt. to eval. the circulation in her legs again.  Stated her left foot "turns dark when it hangs down."  Stated she had aching in her upper legs from hips to knees last night; relieved with Tylenol.  Reported her next appt. is in July, and requests to be evaluated sooner.  Advised will have a Scheduler contact her re: appt. options.  Agreed.

## 2016-07-02 ENCOUNTER — Telehealth: Payer: Self-pay

## 2016-07-02 NOTE — Telephone Encounter (Signed)
Resched labs 08/12/16 at 3:00 and MD 08/25/16 at 11:45. Lm on cell# to inform pt of new appts.

## 2016-07-02 NOTE — Telephone Encounter (Signed)
PA initiated via Covermymeds; KEY: [id number]. Received real-time approval.

## 2016-07-03 DIAGNOSIS — I252 Old myocardial infarction: Secondary | ICD-10-CM | POA: Diagnosis not present

## 2016-07-03 DIAGNOSIS — L97513 Non-pressure chronic ulcer of other part of right foot with necrosis of muscle: Secondary | ICD-10-CM | POA: Diagnosis not present

## 2016-07-03 DIAGNOSIS — E11621 Type 2 diabetes mellitus with foot ulcer: Secondary | ICD-10-CM | POA: Diagnosis not present

## 2016-07-03 DIAGNOSIS — E1152 Type 2 diabetes mellitus with diabetic peripheral angiopathy with gangrene: Secondary | ICD-10-CM | POA: Diagnosis not present

## 2016-07-03 DIAGNOSIS — I10 Essential (primary) hypertension: Secondary | ICD-10-CM | POA: Diagnosis not present

## 2016-07-03 DIAGNOSIS — I4891 Unspecified atrial fibrillation: Secondary | ICD-10-CM | POA: Diagnosis not present

## 2016-07-03 DIAGNOSIS — L97511 Non-pressure chronic ulcer of other part of right foot limited to breakdown of skin: Secondary | ICD-10-CM | POA: Diagnosis not present

## 2016-07-03 DIAGNOSIS — J449 Chronic obstructive pulmonary disease, unspecified: Secondary | ICD-10-CM | POA: Diagnosis not present

## 2016-07-03 DIAGNOSIS — E785 Hyperlipidemia, unspecified: Secondary | ICD-10-CM | POA: Diagnosis not present

## 2016-07-05 ENCOUNTER — Encounter (HOSPITAL_BASED_OUTPATIENT_CLINIC_OR_DEPARTMENT_OTHER): Payer: Medicare HMO | Attending: Internal Medicine

## 2016-07-05 ENCOUNTER — Telehealth: Payer: Self-pay | Admitting: Vascular Surgery

## 2016-07-05 ENCOUNTER — Other Ambulatory Visit (HOSPITAL_COMMUNITY)
Admission: RE | Admit: 2016-07-05 | Discharge: 2016-07-05 | Disposition: A | Discharge status: Home / Self Care | Payer: Medicare HMO | Source: Other Acute Inpatient Hospital | Attending: Internal Medicine | Admitting: Internal Medicine

## 2016-07-05 DIAGNOSIS — I10 Essential (primary) hypertension: Secondary | ICD-10-CM | POA: Insufficient documentation

## 2016-07-05 DIAGNOSIS — L97518 Non-pressure chronic ulcer of other part of right foot with other specified severity: Secondary | ICD-10-CM | POA: Insufficient documentation

## 2016-07-05 DIAGNOSIS — M86371 Chronic multifocal osteomyelitis, right ankle and foot: Secondary | ICD-10-CM | POA: Insufficient documentation

## 2016-07-05 DIAGNOSIS — E1142 Type 2 diabetes mellitus with diabetic polyneuropathy: Secondary | ICD-10-CM | POA: Insufficient documentation

## 2016-07-05 DIAGNOSIS — E1151 Type 2 diabetes mellitus with diabetic peripheral angiopathy without gangrene: Secondary | ICD-10-CM | POA: Insufficient documentation

## 2016-07-05 DIAGNOSIS — I70235 Atherosclerosis of native arteries of right leg with ulceration of other part of foot: Secondary | ICD-10-CM | POA: Diagnosis not present

## 2016-07-05 DIAGNOSIS — Z794 Long term (current) use of insulin: Secondary | ICD-10-CM | POA: Diagnosis not present

## 2016-07-05 DIAGNOSIS — L97512 Non-pressure chronic ulcer of other part of right foot with fat layer exposed: Secondary | ICD-10-CM | POA: Diagnosis not present

## 2016-07-05 NOTE — Telephone Encounter (Signed)
-----   Message from Mena Goes, RN sent at 07/05/2016 10:41 AM EDT ----- Regarding: move up Patient has talked to [place] about her toe amp before, Sharyn Lull moved her up to 6-27 with CSD. Can we forget about the labs and just put her on 07-21-16 with CSD to see her, she has osteomyelitis now. Or we can bring her in with NP.  Please call her.

## 2016-07-05 NOTE — Telephone Encounter (Signed)
Called pt and left message for her regarding Kay's triage staff message. I scheduled the patient to see Dr.Dickson on 07/21/16 at 9:45am. I left her a VM regarding this appt. awt

## 2016-07-06 ENCOUNTER — Other Ambulatory Visit: Payer: Self-pay

## 2016-07-06 ENCOUNTER — Encounter: Payer: Self-pay | Admitting: Internal Medicine

## 2016-07-06 MED ORDER — DULOXETINE HCL 30 MG PO CPEP: 30.0000 mg | ORAL_CAPSULE | Freq: Every day | ORAL | 3 refills | Status: DC

## 2016-07-06 NOTE — Telephone Encounter (Signed)
Printed cymbalta rx. Will sign. Will you fax or resend to whatever pharmacy she chooses.

## 2016-07-06 NOTE — Telephone Encounter (Signed)
Pt is requesting refill: Cymbalta. Last filled on 05/02/16 30x0 Prescribed by Dr. Jacob Moores. Please advise

## 2016-07-07 DIAGNOSIS — I10 Essential (primary) hypertension: Secondary | ICD-10-CM | POA: Diagnosis not present

## 2016-07-07 DIAGNOSIS — E785 Hyperlipidemia, unspecified: Secondary | ICD-10-CM | POA: Diagnosis not present

## 2016-07-07 DIAGNOSIS — L97513 Non-pressure chronic ulcer of other part of right foot with necrosis of muscle: Secondary | ICD-10-CM | POA: Diagnosis not present

## 2016-07-07 DIAGNOSIS — E11621 Type 2 diabetes mellitus with foot ulcer: Secondary | ICD-10-CM | POA: Diagnosis not present

## 2016-07-07 DIAGNOSIS — I4891 Unspecified atrial fibrillation: Secondary | ICD-10-CM | POA: Diagnosis not present

## 2016-07-07 DIAGNOSIS — J449 Chronic obstructive pulmonary disease, unspecified: Secondary | ICD-10-CM | POA: Diagnosis not present

## 2016-07-07 DIAGNOSIS — I252 Old myocardial infarction: Secondary | ICD-10-CM | POA: Diagnosis not present

## 2016-07-07 DIAGNOSIS — L97511 Non-pressure chronic ulcer of other part of right foot limited to breakdown of skin: Secondary | ICD-10-CM | POA: Diagnosis not present

## 2016-07-07 DIAGNOSIS — E1152 Type 2 diabetes mellitus with diabetic peripheral angiopathy with gangrene: Secondary | ICD-10-CM | POA: Diagnosis not present

## 2016-07-08 NOTE — Telephone Encounter (Signed)
Spoke with Alexis [name] from [place]. Pt needs a referral  from PCP sent to [facility] prior to electric scooter order.. Pt will need to call member services to request scooter. When [facility] receives referral for scooter  information  needed for approval will be faxed over.   Notified pt she has to call member services to request scooter. Pt states that she will call today.   Please forward paperwork to me when received.

## 2016-07-09 LAB — AEROBIC CULTURE W GRAM STAIN (SUPERFICIAL SPECIMEN)

## 2016-07-09 LAB — AEROBIC CULTURE  (SUPERFICIAL SPECIMEN)

## 2016-07-12 DIAGNOSIS — M86371 Chronic multifocal osteomyelitis, right ankle and foot: Secondary | ICD-10-CM | POA: Diagnosis not present

## 2016-07-12 DIAGNOSIS — E1142 Type 2 diabetes mellitus with diabetic polyneuropathy: Secondary | ICD-10-CM | POA: Diagnosis not present

## 2016-07-12 DIAGNOSIS — E1151 Type 2 diabetes mellitus with diabetic peripheral angiopathy without gangrene: Secondary | ICD-10-CM | POA: Diagnosis not present

## 2016-07-12 DIAGNOSIS — L97518 Non-pressure chronic ulcer of other part of right foot with other specified severity: Secondary | ICD-10-CM | POA: Diagnosis not present

## 2016-07-12 DIAGNOSIS — E11621 Type 2 diabetes mellitus with foot ulcer: Secondary | ICD-10-CM | POA: Diagnosis not present

## 2016-07-12 DIAGNOSIS — I10 Essential (primary) hypertension: Secondary | ICD-10-CM | POA: Diagnosis not present

## 2016-07-12 DIAGNOSIS — I70235 Atherosclerosis of native arteries of right leg with ulceration of other part of foot: Secondary | ICD-10-CM | POA: Diagnosis not present

## 2016-07-12 DIAGNOSIS — L97514 Non-pressure chronic ulcer of other part of right foot with necrosis of bone: Secondary | ICD-10-CM | POA: Diagnosis not present

## 2016-07-12 DIAGNOSIS — Z794 Long term (current) use of insulin: Secondary | ICD-10-CM | POA: Diagnosis not present

## 2016-07-13 ENCOUNTER — Encounter: Payer: Self-pay | Admitting: Vascular Surgery

## 2016-07-14 DIAGNOSIS — I252 Old myocardial infarction: Secondary | ICD-10-CM | POA: Diagnosis not present

## 2016-07-14 DIAGNOSIS — E11621 Type 2 diabetes mellitus with foot ulcer: Secondary | ICD-10-CM | POA: Diagnosis not present

## 2016-07-14 DIAGNOSIS — L97513 Non-pressure chronic ulcer of other part of right foot with necrosis of muscle: Secondary | ICD-10-CM | POA: Diagnosis not present

## 2016-07-14 DIAGNOSIS — J449 Chronic obstructive pulmonary disease, unspecified: Secondary | ICD-10-CM | POA: Diagnosis not present

## 2016-07-14 DIAGNOSIS — E785 Hyperlipidemia, unspecified: Secondary | ICD-10-CM | POA: Diagnosis not present

## 2016-07-14 DIAGNOSIS — I10 Essential (primary) hypertension: Secondary | ICD-10-CM | POA: Diagnosis not present

## 2016-07-14 DIAGNOSIS — L97511 Non-pressure chronic ulcer of other part of right foot limited to breakdown of skin: Secondary | ICD-10-CM | POA: Diagnosis not present

## 2016-07-14 DIAGNOSIS — I4891 Unspecified atrial fibrillation: Secondary | ICD-10-CM | POA: Diagnosis not present

## 2016-07-14 DIAGNOSIS — E1152 Type 2 diabetes mellitus with diabetic peripheral angiopathy with gangrene: Secondary | ICD-10-CM | POA: Diagnosis not present

## 2016-07-19 ENCOUNTER — Telehealth: Payer: Self-pay | Admitting: Medical

## 2016-07-19 DIAGNOSIS — I10 Essential (primary) hypertension: Secondary | ICD-10-CM | POA: Diagnosis not present

## 2016-07-19 DIAGNOSIS — E1151 Type 2 diabetes mellitus with diabetic peripheral angiopathy without gangrene: Secondary | ICD-10-CM | POA: Diagnosis not present

## 2016-07-19 DIAGNOSIS — L97518 Non-pressure chronic ulcer of other part of right foot with other specified severity: Secondary | ICD-10-CM | POA: Diagnosis not present

## 2016-07-19 DIAGNOSIS — M86371 Chronic multifocal osteomyelitis, right ankle and foot: Secondary | ICD-10-CM | POA: Diagnosis not present

## 2016-07-19 DIAGNOSIS — E11621 Type 2 diabetes mellitus with foot ulcer: Secondary | ICD-10-CM | POA: Diagnosis not present

## 2016-07-19 DIAGNOSIS — E1142 Type 2 diabetes mellitus with diabetic polyneuropathy: Secondary | ICD-10-CM | POA: Diagnosis not present

## 2016-07-19 DIAGNOSIS — L97512 Non-pressure chronic ulcer of other part of right foot with fat layer exposed: Secondary | ICD-10-CM | POA: Diagnosis not present

## 2016-07-19 DIAGNOSIS — Z794 Long term (current) use of insulin: Secondary | ICD-10-CM | POA: Diagnosis not present

## 2016-07-19 NOTE — Telephone Encounter (Signed)
-----   Message from Katha Hamming sent at 07/19/2016 10:57 AM EDT ----- Regarding: DEXA We have now left three messages for Alexis [name] to call back and schedule her bone density.  It looks like she hasn't had a mammogram either.   She is coming in tomorrow to see edward according to her apptmt desk.  Maybe then she can be told to step in and schedule.  I left her a message about that.  Thanks, Hoyle Sauer

## 2016-07-20 ENCOUNTER — Encounter: Payer: Self-pay | Admitting: Medical

## 2016-07-20 ENCOUNTER — Ambulatory Visit (INDEPENDENT_AMBULATORY_CARE_PROVIDER_SITE_OTHER): Payer: Medicare HMO | Admitting: Medical

## 2016-07-20 VITALS — BP 103/58 | HR 85 | Temp 97.6°F | Resp 14

## 2016-07-20 DIAGNOSIS — G629 Polyneuropathy, unspecified: Secondary | ICD-10-CM

## 2016-07-20 DIAGNOSIS — M25562 Pain in left knee: Secondary | ICD-10-CM

## 2016-07-20 DIAGNOSIS — M869 Osteomyelitis, unspecified: Secondary | ICD-10-CM

## 2016-07-20 DIAGNOSIS — G8929 Other chronic pain: Secondary | ICD-10-CM

## 2016-07-20 DIAGNOSIS — L089 Local infection of the skin and subcutaneous tissue, unspecified: Secondary | ICD-10-CM | POA: Diagnosis not present

## 2016-07-20 DIAGNOSIS — I739 Peripheral vascular disease, unspecified: Secondary | ICD-10-CM

## 2016-07-20 NOTE — Progress Notes (Signed)
Subjective:    Patient ID: Alexis [name], female    DOB: [birth year], 66 y.o.   MRN: [id number]  HPI   Pt in today for evaluation. To discuss getting power Scooter. Pt has been struggling with chronic rt foot infection since thanksgiving. Pt had some debridement of non-viable tissue. She has been seeing wound care doctor. Pt at this point pt states wound care considering she may need amputation of her toe. Pt is diabetic. She had distal SFA and popliteal artery that was occluded. She had below procedure to establish flow.  Using Glidewire quick cross catheter we crossed the occlusion confirmed intraluminal access. We then exchanged for 014 wire and placed an embolic protection device at the below-knee popliteal artery. Following this jetstream atherectomy of the SFA and popliteal arteries were performed followed by predilatation with a 4 mm balloon. We then performed drug-coated balloon angioplasty of the proximal SFA and distal SFA popliteal arteries with 5 mm balloon. Following this we performed angiography with the above findings and 0% residual stenosis and runoff to the foot via the peroneal artery on the right. Satisfied we retrieved our filter removed our catheter confirmed our filter was intact.  Pt states wound care told her to keep off her feet.   Pt wants power scooter.(It appears would benefit from wheelchair or power scooter). Pt has been using regular wheel chair. Husband push her but when she is alone describes some difficulty using.  She also reports for while has been using mobility scooter at stores. She states her neuropathy pain is much worse when she waks.  No upper extremity pain reported.   Pt currently has infection in bone 2nd toe rt foot.  Pt has history of knee replacement. She report occasional knee pain on ambulation and feels like knee gives out. When ambulates pain mostly at end of the day. She reports swelling of knee as well.  Lower ext neuropathy type  pain when walks 8-9/10.   Review of Systems  Constitutional: Negative for chills, fatigue and fever.  Respiratory: Negative for cough, chest tightness, shortness of breath and wheezing.   Cardiovascular: Negative for chest pain and palpitations.  Gastrointestinal: Negative for abdominal pain, constipation, diarrhea, nausea and vomiting.  Musculoskeletal: Negative for back pain and joint swelling.       Lt knee pain.  Skin: Negative for rash.       Rt second toe skin infection/toe/bone infection as well.  Neurological: Negative for dizziness, syncope, weakness and headaches.       Feet neuropathy.  Hematological: Negative for adenopathy. Does not bruise/bleed easily.  Psychiatric/Behavioral: Negative for confusion and sleep disturbance. The patient is not nervous/anxious.     Past Medical History:  Diagnosis Date  . Allergy   . Anxiety   . Arthritis   . Asthma   . Atrial fibrillation ([place])   . CHF (congestive heart failure) ([place])   . COPD (chronic obstructive pulmonary disease) ([place])   . Diabetes mellitus without complication ([place])   . Hyperlipidemia   . Hypertension   . Myocardial infarction ([place])    several  . Pacemaker    CRT-P with RV lead and His Bundle lead ( high threshold)   . Peripheral neuropathy   . Thyroid disease      Social History   Social History  . Marital status: Married    Spouse name: N/A  . Number of children: N/A  . Years of education: N/A   Occupational History  . retired  Social History Main Topics  . Smoking status: Former Smoker    Quit date: 03/01/1998  . Smokeless tobacco: Never Used  . 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  . Drug use: No     Comment: remote use  . Sexual activity: No   Other Topics Concern  . Not on file   Social History Narrative  . No narrative on file    Past Surgical History:  Procedure Laterality Date  . ABDOMINAL AORTOGRAM W/LOWER EXTREMITY N/A 04/29/2016     Procedure: Abdominal Aortogram w/Lower Extremity;  Surgeon: Waynetta Sandy, MD;  Location: [facility];  Service: Cardiovascular;  Laterality: N/A;  . PERIPHERAL VASCULAR ATHERECTOMY Right 04/29/2016   Procedure: Peripheral Vascular Atherectomy-Right Popliteal;  Surgeon: Waynetta Sandy, MD;  Location: [facility];  Service: Cardiovascular;  Laterality: Right;  . PERIPHERAL VASCULAR INTERVENTION Right 04/29/2016   Procedure: Peripheral Vascular Intervention-Right Popliteal;  Surgeon: Waynetta Sandy, MD;  Location: [facility];  Service: Cardiovascular;  Laterality: Right;  POPLITEAL PTA  . RADIOFREQUENCY ABLATION    . TOTAL KNEE ARTHROPLASTY      Family History  Problem Relation Age of Onset  . Diabetes Mother   . Hyperlipidemia Mother   . Diabetes Father   . Hyperlipidemia Father   . Heart disease Father        before age 39  . Heart attack Father   . Diabetes Brother   . Hyperlipidemia Brother   . Heart attack Brother     Allergies  Allergen Reactions  . Indomethacin Other (See Comments)     Renal Insufficiency  . Pregabalin Other (See Comments)    DIZZINESS   . Sulfa Antibiotics Hives    Current Outpatient Prescriptions on File Prior to Visit  Medication Sig Dispense Refill  . acetaminophen (TYLENOL) 325 MG tablet Take 2 tablets (650 mg total) by mouth every 4 (four) hours as needed for headache or mild pain.    Marland Kitchen albuterol (PROVENTIL HFA;VENTOLIN HFA) 108 (90 Base) MCG/ACT inhaler Inhale 2 puffs into the lungs every 6 (six) hours as needed for wheezing or shortness of breath. 1 Inhaler 2  . albuterol (PROVENTIL) (2.5 MG/3ML) 0.083% nebulizer solution Take 3 mLs (2.5 mg total) by nebulization every 6 (six) hours as needed for wheezing or shortness of breath. 150 mL 1  . allopurinol (ZYLOPRIM) 100 MG tablet Take ONE (1) tablet by mouth once daily (Patient taking differently: Take 100 mg by mouth at bedtime. ) 90 tablet 3  .  amitriptyline (ELAVIL) 25 MG tablet Take 1 tablet (25 mg total) by mouth at bedtime. 30 tablet 0  . apixaban (ELIQUIS) 5 MG TABS tablet Take 1 tablet (5 mg total) by mouth 2 (two) times daily. 180 tablet 1  . Blood Glucose Monitoring Suppl (GLUCOCOM BLOOD GLUCOSE MONITOR) DEVI Use to check blood sugars 6 times daily E11.65    . DULoxetine (CYMBALTA) 30 MG capsule Take 1 capsule (30 mg total) by mouth daily. 30 capsule 3  . fluticasone (FLONASE) 50 MCG/ACT nasal spray Place 1 spray into both nostrils daily. (Patient taking differently: Place 1 spray into both nostrils at bedtime. Place 1 spray into both nostrils daily.) 16 g 3  . fluticasone (FLOVENT HFA) 110 MCG/ACT inhaler Inhale 2 puffs into the lungs 2 (two) times daily. (Patient taking differently: Inhale 2 puffs into the lungs as needed (bronchitis). ) 1 Inhaler 3  .  Insulin Disposable Pump (V-GO 40) KIT Inject into the skin See admin instructions. Use with Novolog - 6 pumps before each meal    . Insulin Pen Needle (B-D ULTRAFINE III SHORT PEN) 31G X 8 MM MISC Use for insulin pen up to five times daily E11.65    . levothyroxine (SYNTHROID, LEVOTHROID) 175 MCG tablet Take 175 mcg by mouth daily before breakfast.    . metoprolol succinate (TOPROL-XL) 25 MG 24 hr tablet Take 1 tablet by mouth daily.    Marland Kitchen oxyCODONE-acetaminophen (ROXICET) 5-325 MG tablet Take 1 tablet by mouth every 6 (six) hours as needed for severe pain. 24 tablet 0  . Prenatal Vit-Fe Fumarate-FA (MULTIVITAMIN-PRENATAL) 27-0.8 MG TABS tablet Take 1 tablet by mouth daily. Takes for nail and hair growth     . torsemide (DEMADEX) 10 MG tablet Take 1 tablet (10 mg total) by mouth daily.     No current facility-administered medications on file prior to visit.     BP (!) 103/58 (BP Location: Left Arm, Patient Position: Sitting, Cuff Size: Large)   Pulse 85   Temp 97.6 F (36.4 C) (Oral)   Resp 14   SpO2 98%       Objective:   Physical Exam  General- No acute distress.  Pleasant patient. Neck- Full range of motion, no jvd Lungs- Clear, even and unlabored. Heart- regular rate and rhythm. Neurologic- CNII- XII grossly intact.  Rt lower ext/foot- pt has open healing wound on second toe. No dc presently.  Lt knee- old scar present. No swelling. Flexion and extension crepitus.        Assessment & Plan:  For your toe/bone infection continue to see wound care. Please up date me if they decide to do amputation.   I will fill out your power mobility form and explain qualifying reasons are bone infection, lower extremity neuropathy, pvd and chronic intermittent knee. In addition continue wound care debridement and possible toe amputation upcoming.   I will fill out form  and get MD to co-sign as this looks like may be requirement.   I am putting future fasting lipid panel in so you can get this done.(based on your pvd history).  Follow up in 2 months or as needed.   I gave you back blank copy of form. It may be helpful to get wound care MD to fill out form as well. Will see how insurance responds to our request first.  Mackie Pai, PA-C

## 2016-07-20 NOTE — Patient Instructions (Addendum)
For your toe/bone infection continue to see wound care. Please up date me if they decide to do amputation.   I will fill out your power mobility form and explain qualifying reasons are bone infection, lower extremity neuropathy, pvd and chronic intermittent knee. In addition continue wound care debridement and possible toe amputation upcoming.   I will fill out form  and get MD to co-sign as this looks like may be requirement.   I am putting future fasting lipid panel in so you can get this done.(based on your pvd history).  Follow up in 2 months or as needed.   I gave you back blank copy of form. It may be helpful to get wound care MD to fill out form as well. Will see how insurance responds to our request first.

## 2016-07-21 ENCOUNTER — Encounter: Payer: Self-pay | Admitting: Vascular Surgery

## 2016-07-21 ENCOUNTER — Ambulatory Visit: Payer: Medicare HMO | Admitting: Medical

## 2016-07-21 ENCOUNTER — Ambulatory Visit (INDEPENDENT_AMBULATORY_CARE_PROVIDER_SITE_OTHER): Payer: Medicare HMO | Admitting: Vascular Surgery

## 2016-07-21 ENCOUNTER — Encounter: Payer: Self-pay | Admitting: Medical

## 2016-07-21 ENCOUNTER — Other Ambulatory Visit: Payer: Self-pay | Admitting: *Deleted

## 2016-07-21 VITALS — BP 139/62 | HR 58 | Temp 96.8°F | Resp 20 | Ht 62.0 in | Wt 224.0 lb

## 2016-07-21 DIAGNOSIS — I739 Peripheral vascular disease, unspecified: Secondary | ICD-10-CM

## 2016-07-21 NOTE — Progress Notes (Signed)
 Patient name: Alexis [name] MRN: [id number] DOB: 04/21/1950 Sex: female  REASON FOR VISIT:    Follow up.  HPI:   Alexis [name] is a 65 y.o. female who I last saw on 05/26/2016. She had originally presented with multiple gangrenous toes of the right foot. She underwent atherectomy of the right superficial femoral artery and popliteal artery with a drug coated balloon angioplasty of both arteries. She has peroneal runoff only on the right. When I saw her last, she had made excellent progress with her wounds and had a brisk peroneal signal with the Doppler on the right. The plan was to see her back in 3 months. She comes in for a follow up visit.   She was sent back early because the wound care center felt that she had osteomyelitis of the second toe on the right foot. I did review the x-ray dated 06/30/2016 which does show findings suggesting osteomyelitis of the distal aspect of the proximal phalanx and proximal aspect of the middle phalanx of the right second toe with possible septic arthritis. She has been on antibiotics.  She denies any rest pain in the right foot.  Past Medical History:  Diagnosis Date  . Allergy   . Anxiety   . Arthritis   . Asthma   . Atrial fibrillation (H[facility])   . CHF (congestive heart failure) (H[facility])   . COPD (chronic obstructive pulmonary disease) ([facility])   . Diabetes mellitus without complication ([facility])   . Hyperlipidemia   . Hypertension   . Myocardial infarction ([facility])    several  . Pacemaker    CRT-P with RV lead and His Bundle lead ( high threshold)   . Peripheral neuropathy   . Thyroid disease     Family History  Problem Relation Age of Onset  . Diabetes Mother   . Hyperlipidemia Mother   . Diabetes Father   . Hyperlipidemia Father   . Heart disease Father        before age 60  . Heart attack Father   . Diabetes Brother   . Hyperlipidemia Brother   . Heart attack Brother     SOCIAL HISTORY: Social History  Substance Use Topics  .  Smoking status: Former Smoker    Quit date: 03/01/1998  . Smokeless tobacco: Never Used  . 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    Allergies  Allergen Reactions  . Indomethacin Other (See Comments)     Renal Insufficiency  . Pregabalin Other (See Comments)    DIZZINESS   . Sulfa Antibiotics Hives    Current Outpatient Prescriptions  Medication Sig Dispense Refill  . acetaminophen (TYLENOL) 325 MG tablet Take 2 tablets (650 mg total) by mouth every 4 (four) hours as needed for headache or mild pain.    . albuterol (PROVENTIL HFA;VENTOLIN HFA) 108 (90 Base) MCG/ACT inhaler Inhale 2 puffs into the lungs every 6 (six) hours as needed for wheezing or shortness of breath. 1 Inhaler 2  . albuterol (PROVENTIL) (2.5 MG/3ML) 0.083% nebulizer solution Take 3 mLs (2.5 mg total) by nebulization every 6 (six) hours as needed for wheezing or shortness of breath. 150 mL 1  . allopurinol (ZYLOPRIM) 100 MG tablet Take ONE (1) tablet by mouth once daily (Patient taking differently: Take 100 mg by mouth at bedtime. ) 90 tablet 3  . amitriptyline (ELAVIL) 25 MG tablet Take 1 tablet (25 mg total) by mouth at bedtime.   30 tablet 0  . apixaban (ELIQUIS) 5 MG TABS tablet Take 1 tablet (5 mg total) by mouth 2 (two) times daily. 180 tablet 1  . Blood Glucose Monitoring Suppl (GLUCOCOM BLOOD GLUCOSE MONITOR) DEVI Use to check blood sugars 6 times daily E11.65    . doxycycline (MONODOX) 100 MG capsule Take 100 mg by mouth 2 (two) times daily.    . DULoxetine (CYMBALTA) 30 MG capsule Take 1 capsule (30 mg total) by mouth daily. 30 capsule 3  . fluticasone (FLONASE) 50 MCG/ACT nasal spray Place 1 spray into both nostrils daily. (Patient taking differently: Place 1 spray into both nostrils at bedtime. Place 1 spray into both nostrils daily.) 16 g 3  . fluticasone (FLOVENT HFA) 110 MCG/ACT inhaler Inhale 2 puffs into the lungs 2 (two) times daily. (Patient taking differently:  Inhale 2 puffs into the lungs as needed (bronchitis). ) 1 Inhaler 3  . Insulin Disposable Pump (V-GO 40) KIT Inject into the skin See admin instructions. Use with Novolog - 6 pumps before each meal    . levothyroxine (SYNTHROID, LEVOTHROID) 175 MCG tablet Take 175 mcg by mouth daily before breakfast.    . metoprolol succinate (TOPROL-XL) 25 MG 24 hr tablet Take 1 tablet by mouth daily.    Marland Kitchen oxyCODONE-acetaminophen (ROXICET) 5-325 MG tablet Take 1 tablet by mouth every 6 (six) hours as needed for severe pain. 24 tablet 0  . Prenatal Vit-Fe Fumarate-FA (MULTIVITAMIN-PRENATAL) 27-0.8 MG TABS tablet Take 1 tablet by mouth daily. Takes for nail and hair growth     . torsemide (DEMADEX) 10 MG tablet Take 1 tablet (10 mg total) by mouth daily.     No current facility-administered medications for this visit.     REVIEW OF SYSTEMS:  [X] denotes positive finding, [ ] denotes negative finding Cardiac  Comments:  Chest pain or chest pressure:    Shortness of breath upon exertion:    Short of breath when lying flat:    Irregular heart rhythm:        Vascular    Pain in calf, thigh, or hip brought on by ambulation:    Pain in feet at night that wakes you up from your sleep:     Blood clot in your veins:    Leg swelling:         Pulmonary    Oxygen at home:    Productive cough:     Wheezing:         Neurologic    Sudden weakness in arms or legs:     Sudden numbness in arms or legs:     Sudden onset of difficulty speaking or slurred speech:    Temporary loss of vision in one eye:     Problems with dizziness:         Gastrointestinal    Blood in stool:     Vomited blood:         Genitourinary    Burning when urinating:     Blood in urine:        Psychiatric    Major depression:         Hematologic    Bleeding problems:    Problems with blood clotting too easily:        Skin    Rashes or ulcers:        Constitutional    Fever or chills:     PHYSICAL EXAM:   Vitals:    07/21/16 0942  BP: 139/62  Pulse: (!) 58  Resp: 20  Temp: (!) 96.8 F (36 C)  TempSrc: Oral  SpO2: 97%  Weight: 224 lb (101.6 kg)  Height: 5' 2" (1.575 m)    GENERAL: The patient is a well-nourished female, in no acute distress. The vital signs are documented above. CARDIAC: There is a regular rate and rhythm.  VASCULAR: She has a brisk peroneal signal with the Doppler on the right and also a brisk posterior tibial signal. There is no dorsalis pedis signal. PULMONARY: There is good air exchange bilaterally without wheezing or rales. ABDOMEN: Soft and non-tender with normal pitched bowel sounds.  MUSCULOSKELETAL: There are no major deformities or cyanosis. NEUROLOGIC: No focal weakness or paresthesias are detected. SKIN: She has an eschar over her proximal right second toe on the dorsal surface. PSYCHIATRIC: The patient has a normal affect.  DATA:    X-ray shows osteomyelitis of the right second toe.  MEDICAL ISSUES:   OSTEOMYELITIS RIGHT SECOND TOE: The patient has ostial myelitis of the right second toe and I have recommended right second toe amputation. I'm somewhat concerned in that the patient only has peroneal runoff on the right. Certainly there is some chance of nonhealing given that her anterior tibial and posterior tibial arteries are occluded with no other options for revascularization. I'm also concerned that the second and third toes are fused proximally and I have explained it is a small chance will also have to take the third toe. Her surgeries been scheduled for 07/30/2016. We will hold her Eliquis 48 hours preoperatively.  Milli Woolridge [facility] [phone] 

## 2016-07-22 ENCOUNTER — Telehealth: Payer: Self-pay | Admitting: Medical

## 2016-07-22 NOTE — Telephone Encounter (Signed)
[place] [employer] form filled and signed by myself and Dr. Etter Sjogren. Will you fax former over and keep form available for next 2 weeks in case we get denial or need modification. If goes through then will scan.

## 2016-07-22 NOTE — Telephone Encounter (Signed)
Faxed form placed in file slot(on desk) for [place].

## 2016-07-23 DIAGNOSIS — I252 Old myocardial infarction: Secondary | ICD-10-CM | POA: Diagnosis not present

## 2016-07-23 DIAGNOSIS — E11621 Type 2 diabetes mellitus with foot ulcer: Secondary | ICD-10-CM | POA: Diagnosis not present

## 2016-07-23 DIAGNOSIS — E785 Hyperlipidemia, unspecified: Secondary | ICD-10-CM | POA: Diagnosis not present

## 2016-07-23 DIAGNOSIS — E1152 Type 2 diabetes mellitus with diabetic peripheral angiopathy with gangrene: Secondary | ICD-10-CM | POA: Diagnosis not present

## 2016-07-23 DIAGNOSIS — L97511 Non-pressure chronic ulcer of other part of right foot limited to breakdown of skin: Secondary | ICD-10-CM | POA: Diagnosis not present

## 2016-07-23 DIAGNOSIS — I10 Essential (primary) hypertension: Secondary | ICD-10-CM | POA: Diagnosis not present

## 2016-07-23 DIAGNOSIS — J449 Chronic obstructive pulmonary disease, unspecified: Secondary | ICD-10-CM | POA: Diagnosis not present

## 2016-07-23 DIAGNOSIS — L97513 Non-pressure chronic ulcer of other part of right foot with necrosis of muscle: Secondary | ICD-10-CM | POA: Diagnosis not present

## 2016-07-23 DIAGNOSIS — I4891 Unspecified atrial fibrillation: Secondary | ICD-10-CM | POA: Diagnosis not present

## 2016-07-28 DIAGNOSIS — E1152 Type 2 diabetes mellitus with diabetic peripheral angiopathy with gangrene: Secondary | ICD-10-CM | POA: Diagnosis not present

## 2016-07-28 DIAGNOSIS — L97511 Non-pressure chronic ulcer of other part of right foot limited to breakdown of skin: Secondary | ICD-10-CM | POA: Diagnosis not present

## 2016-07-28 DIAGNOSIS — J449 Chronic obstructive pulmonary disease, unspecified: Secondary | ICD-10-CM | POA: Diagnosis not present

## 2016-07-28 DIAGNOSIS — E11621 Type 2 diabetes mellitus with foot ulcer: Secondary | ICD-10-CM | POA: Diagnosis not present

## 2016-07-28 DIAGNOSIS — I4891 Unspecified atrial fibrillation: Secondary | ICD-10-CM | POA: Diagnosis not present

## 2016-07-28 DIAGNOSIS — I10 Essential (primary) hypertension: Secondary | ICD-10-CM | POA: Diagnosis not present

## 2016-07-28 DIAGNOSIS — L97513 Non-pressure chronic ulcer of other part of right foot with necrosis of muscle: Secondary | ICD-10-CM | POA: Diagnosis not present

## 2016-07-28 DIAGNOSIS — I252 Old myocardial infarction: Secondary | ICD-10-CM | POA: Diagnosis not present

## 2016-07-28 DIAGNOSIS — E785 Hyperlipidemia, unspecified: Secondary | ICD-10-CM | POA: Diagnosis not present

## 2016-07-29 ENCOUNTER — Encounter: Payer: Self-pay | Admitting: Medical

## 2016-07-29 ENCOUNTER — Encounter (HOSPITAL_COMMUNITY): Payer: Self-pay | Admitting: *Deleted

## 2016-07-29 NOTE — Progress Notes (Signed)
Pt denies SOB and chest pain but is under the care of Dr. Curt Bears, Cardiology. Pt stated that a stress test and cardiac cath was performed > 10 years ago. Rhonda, Diabetes Coordinator, advised that pt keep VGO insulin pump on ( no bolus). Dr. Cheri Kearns. Ola Spurr advised that pt can eat up until 6:45 AM the morning of procedure. Pt stated that her last dose of Eliquis was Tuesday. Pt made aware to stop taking vitamins, fish oil and herbal medications. Do not take any NSAIDs ie: Ibuprofen, Advil, Naproxen, BC and Goody Powder. Pt made aware to not take HS Novolog tonight, check BG every 2 hours prior to arrival to hospital in the am  ,treat a BG < 70 with 4 glucose tabs, wait 15 minutes after taking tabs to recheck BG, if BG remains <70 call SS unit. Pt verbalized understanding of all pre-op instructions. Anesthesia asked to review pt history ( see note).

## 2016-07-29 NOTE — Progress Notes (Signed)
Anesthesia Chart Review:  Pt is a same day work up.   Pt is a 66 year old female scheduled for amputation R 2nd toe, possible 3rd toe on 07/30/2016 with [name], MD  - Mackie Pai, PA - Cardiologist is Larae Grooms, MD - EP cardiologist is Allegra Lai, MD, last office visit 06/09/16 - Endocrinology care by Peri Jefferson, PA (notes in care everywhere)   PMH includes: CHF, MI, permanent atrial fibrillation, pacemaker (implanted for tachy-brady syndrome, notes indicate pt may not need it now that she is in permanent afib; [employer] CRT-P implanted 05/23/14), HTN, DM, hyperlipidemia, COPD, thyroid disease Berenice Primas' treated with radioactive iodine). Former smoker. BMI 41.  - Hospitalized 2/27-05/02/16 for gangrene of foot, s/p arthrectomy and PTA of R SFA and popliteal arteries. Complicated by AKI  - Hospitalized 2/5-8/18 for vomiting and diarrhea, afib with RVR   Medications include: Albuterol, Eliquis, levothyroxine, metoprolol, NovoLog (insulin pump), ramipril, torsemide. Patient to stop Eliquis 48 hours prior to surgery  Labs will be obtained DOS. Last HbA1c was 7.2 on 06/18/16  CXR 04/07/16: Chronic bronchitic changes. Mild perihilar interstitial prominence, stable. Stable cardiomegaly.  EKG 04/05/16: AF, V paced  Echo 11/26/15:  - Left ventricle: The cavity size was normal. Wall thickness was normal. Systolic function was normal. The estimated ejection fraction was in the range of 50% to 55%. - Left atrium: The atrium was mildly dilated. - Right ventricle: The cavity size was mildly dilated. - Atrial septum: No defect or patent foramen ovale was identified. - Impressions: Overall poor image quality  If labs acceptable DOS, I anticipate pt can proceed as scheduled.   Willeen Cass, FNP-BC [facility]/Anesthesiology Phone: [phone] 07/29/2016 1:52 PM

## 2016-07-30 ENCOUNTER — Observation Stay (HOSPITAL_COMMUNITY)
Admission: RE | Admit: 2016-07-30 | Discharge: 2016-07-31 | Disposition: A | Discharge status: Home / Self Care | Payer: Medicare HMO | Source: Ambulatory Visit | Attending: Vascular Surgery | Admitting: Vascular Surgery

## 2016-07-30 ENCOUNTER — Encounter: Payer: Self-pay | Admitting: Medical

## 2016-07-30 ENCOUNTER — Telehealth: Payer: Self-pay | Admitting: Medical

## 2016-07-30 ENCOUNTER — Ambulatory Visit (HOSPITAL_COMMUNITY): Payer: Medicare HMO | Admitting: Certified Registered Nurse Anesthetist

## 2016-07-30 ENCOUNTER — Encounter (HOSPITAL_COMMUNITY)
Admission: RE | Disposition: A | Discharge status: Home / Self Care | Payer: Self-pay | Source: Ambulatory Visit | Attending: Vascular Surgery

## 2016-07-30 ENCOUNTER — Encounter (HOSPITAL_COMMUNITY): Payer: Self-pay | Admitting: Certified Registered Nurse Anesthetist

## 2016-07-30 DIAGNOSIS — D696 Thrombocytopenia, unspecified: Secondary | ICD-10-CM | POA: Diagnosis not present

## 2016-07-30 DIAGNOSIS — E119 Type 2 diabetes mellitus without complications: Secondary | ICD-10-CM | POA: Diagnosis not present

## 2016-07-30 DIAGNOSIS — M869 Osteomyelitis, unspecified: Secondary | ICD-10-CM | POA: Diagnosis not present

## 2016-07-30 DIAGNOSIS — Z950 Presence of cardiac pacemaker: Secondary | ICD-10-CM | POA: Diagnosis not present

## 2016-07-30 DIAGNOSIS — E785 Hyperlipidemia, unspecified: Secondary | ICD-10-CM | POA: Diagnosis not present

## 2016-07-30 DIAGNOSIS — Z888 Allergy status to other drugs, medicaments and biological substances status: Secondary | ICD-10-CM | POA: Diagnosis not present

## 2016-07-30 DIAGNOSIS — M1990 Unspecified osteoarthritis, unspecified site: Secondary | ICD-10-CM | POA: Insufficient documentation

## 2016-07-30 DIAGNOSIS — E1140 Type 2 diabetes mellitus with diabetic neuropathy, unspecified: Secondary | ICD-10-CM | POA: Insufficient documentation

## 2016-07-30 DIAGNOSIS — Z833 Family history of diabetes mellitus: Secondary | ICD-10-CM | POA: Insufficient documentation

## 2016-07-30 DIAGNOSIS — I2510 Atherosclerotic heart disease of native coronary artery without angina pectoris: Secondary | ICD-10-CM | POA: Diagnosis not present

## 2016-07-30 DIAGNOSIS — Z794 Long term (current) use of insulin: Secondary | ICD-10-CM | POA: Diagnosis not present

## 2016-07-30 DIAGNOSIS — I252 Old myocardial infarction: Secondary | ICD-10-CM | POA: Insufficient documentation

## 2016-07-30 DIAGNOSIS — Z8249 Family history of ischemic heart disease and other diseases of the circulatory system: Secondary | ICD-10-CM | POA: Diagnosis not present

## 2016-07-30 DIAGNOSIS — I998 Other disorder of circulatory system: Secondary | ICD-10-CM | POA: Diagnosis not present

## 2016-07-30 DIAGNOSIS — M109 Gout, unspecified: Secondary | ICD-10-CM | POA: Insufficient documentation

## 2016-07-30 DIAGNOSIS — I481 Persistent atrial fibrillation: Secondary | ICD-10-CM | POA: Insufficient documentation

## 2016-07-30 DIAGNOSIS — Z79899 Other long term (current) drug therapy: Secondary | ICD-10-CM | POA: Diagnosis not present

## 2016-07-30 DIAGNOSIS — E039 Hypothyroidism, unspecified: Secondary | ICD-10-CM | POA: Insufficient documentation

## 2016-07-30 DIAGNOSIS — I509 Heart failure, unspecified: Secondary | ICD-10-CM | POA: Insufficient documentation

## 2016-07-30 DIAGNOSIS — E1151 Type 2 diabetes mellitus with diabetic peripheral angiopathy without gangrene: Secondary | ICD-10-CM | POA: Diagnosis not present

## 2016-07-30 DIAGNOSIS — Z882 Allergy status to sulfonamides status: Secondary | ICD-10-CM | POA: Insufficient documentation

## 2016-07-30 DIAGNOSIS — J449 Chronic obstructive pulmonary disease, unspecified: Secondary | ICD-10-CM | POA: Insufficient documentation

## 2016-07-30 DIAGNOSIS — E1169 Type 2 diabetes mellitus with other specified complication: Principal | ICD-10-CM | POA: Insufficient documentation

## 2016-07-30 DIAGNOSIS — F419 Anxiety disorder, unspecified: Secondary | ICD-10-CM | POA: Insufficient documentation

## 2016-07-30 DIAGNOSIS — Z87891 Personal history of nicotine dependence: Secondary | ICD-10-CM | POA: Diagnosis not present

## 2016-07-30 DIAGNOSIS — I110 Hypertensive heart disease with heart failure: Secondary | ICD-10-CM | POA: Insufficient documentation

## 2016-07-30 DIAGNOSIS — Z79891 Long term (current) use of opiate analgesic: Secondary | ICD-10-CM | POA: Diagnosis not present

## 2016-07-30 DIAGNOSIS — M86471 Chronic osteomyelitis with draining sinus, right ankle and foot: Secondary | ICD-10-CM | POA: Diagnosis not present

## 2016-07-30 HISTORY — DX: Other specified postprocedural states: Z98.890

## 2016-07-30 HISTORY — DX: Pneumonia, unspecified organism: J18.9

## 2016-07-30 HISTORY — DX: Nausea with vomiting, unspecified: R11.2

## 2016-07-30 HISTORY — PX: AMPUTATION: SHX166

## 2016-07-30 LAB — CBC
HCT: 42.7 % (ref 36.0–46.0)
HCT: 41.3 % (ref 36.0–46.0)
HEMOGLOBIN: 14 g/dL (ref 12.0–15.0)
Hemoglobin: 13.6 g/dL (ref 12.0–15.0)
MCH: 30.7 pg (ref 26.0–34.0)
MCH: 31 pg (ref 26.0–34.0)
MCHC: 32.8 g/dL (ref 30.0–36.0)
MCHC: 32.9 g/dL (ref 30.0–36.0)
MCV: 93.6 fL (ref 78.0–100.0)
MCV: 94.1 fL (ref 78.0–100.0)
PLATELETS: 68 10*3/uL — AB (ref 150–400)
PLATELETS: 74 10*3/uL — AB (ref 150–400)
RBC: 4.56 MIL/uL (ref 3.87–5.11)
RBC: 4.39 MIL/uL (ref 3.87–5.11)
RDW: 13.9 % (ref 11.5–15.5)
RDW: 13.9 % (ref 11.5–15.5)
WBC: 5.2 10*3/uL (ref 4.0–10.5)
WBC: 5.6 10*3/uL (ref 4.0–10.5)

## 2016-07-30 LAB — BASIC METABOLIC PANEL
Anion gap: 11 (ref 5–15)
BUN: 23 mg/dL — AB (ref 6–20)
CO2: 23 mmol/L (ref 22–32)
Calcium: 9.7 mg/dL (ref 8.9–10.3)
Chloride: 103 mmol/L (ref 101–111)
Creatinine, Ser: 0.72 mg/dL (ref 0.44–1.00)
GFR calc Af Amer: >60 mL/min (ref >60)
GFR calc non Af Amer: >60 mL/min (ref >60)
Glucose, Bld: 153 mg/dL — ABNORMAL HIGH (ref 65–99)
POTASSIUM: 4.1 mmol/L (ref 3.5–5.1)
SODIUM: 137 mmol/L (ref 135–145)

## 2016-07-30 LAB — COMPREHENSIVE METABOLIC PANEL
ALBUMIN: 3.3 g/dL — AB (ref 3.5–5.0)
ALT: 46 U/L (ref 14–54)
AST: 51 U/L — ABNORMAL HIGH (ref 15–41)
Alkaline Phosphatase: 125 U/L (ref 38–126)
Anion gap: 9 (ref 5–15)
BUN: 19 mg/dL (ref 6–20)
CHLORIDE: 101 mmol/L (ref 101–111)
CO2: 25 mmol/L (ref 22–32)
Calcium: 9.1 mg/dL (ref 8.9–10.3)
Creatinine, Ser: 0.72 mg/dL (ref 0.44–1.00)
GFR calc Af Amer: >60 mL/min (ref >60)
GFR calc non Af Amer: >60 mL/min (ref >60)
GLUCOSE: 167 mg/dL — AB (ref 65–99)
POTASSIUM: 3.9 mmol/L (ref 3.5–5.1)
SODIUM: 135 mmol/L (ref 135–145)
Total Bilirubin: 1.1 mg/dL (ref 0.3–1.2)
Total Protein: 7.1 g/dL (ref 6.5–8.1)

## 2016-07-30 LAB — GLUCOSE, CAPILLARY
GLUCOSE-CAPILLARY: 140 mg/dL — AB (ref 65–99)
Glucose-Capillary: 154 mg/dL — ABNORMAL HIGH (ref 65–99)
Glucose-Capillary: 131 mg/dL — ABNORMAL HIGH (ref 65–99)
Glucose-Capillary: 158 mg/dL — ABNORMAL HIGH (ref 65–99)
Glucose-Capillary: 146 mg/dL — ABNORMAL HIGH (ref 65–99)
Glucose-Capillary: 222 mg/dL — ABNORMAL HIGH (ref 65–99)

## 2016-07-30 LAB — PROTIME-INR
INR: 1.08
INR: 1.09
PROTHROMBIN TIME: 14 s (ref 11.4–15.2)
Prothrombin Time: 14.1 seconds (ref 11.4–15.2)

## 2016-07-30 LAB — APTT: aPTT: 30 seconds (ref 24–36)

## 2016-07-30 SURGERY — AMPUTATION DIGIT
Anesthesia: Monitor Anesthesia Care | Laterality: Right

## 2016-07-30 MED ORDER — BACITRACIN ZINC 500 UNIT/GM EX OINT
TOPICAL_OINTMENT | CUTANEOUS | Status: AC
  Filled 2016-07-30: qty 28.35

## 2016-07-30 MED ORDER — OXYCODONE-ACETAMINOPHEN 5-325 MG PO TABS
ORAL_TABLET | ORAL | Status: AC
  Filled 2016-07-30: qty 1

## 2016-07-30 MED ORDER — MIDAZOLAM HCL 5 MG/5ML IJ SOLN
INTRAMUSCULAR | Status: DC | PRN
  Administered 2016-07-30: 2 mg via INTRAVENOUS

## 2016-07-30 MED ORDER — GUAIFENESIN-DM 100-10 MG/5ML PO SYRP: 15.0000 mL | ORAL_SOLUTION | ORAL | Status: DC | PRN

## 2016-07-30 MED ORDER — ALUM & MAG HYDROXIDE-SIMETH 200-200-20 MG/5ML PO SUSP: 15.0000 mL | ORAL | Status: DC | PRN

## 2016-07-30 MED ORDER — OXYCODONE-ACETAMINOPHEN 5-325 MG PO TABS: 1.0000 | ORAL_TABLET | Freq: Four times a day (QID) | ORAL | 0 refills | Status: DC | PRN

## 2016-07-30 MED ORDER — LIDOCAINE HCL 1 % IJ SOLN
INTRAMUSCULAR | Status: AC
  Filled 2016-07-30: qty 20

## 2016-07-30 MED ORDER — ONDANSETRON HCL 4 MG/2ML IJ SOLN
INTRAMUSCULAR | Status: DC | PRN
  Administered 2016-07-30: 4 mg via INTRAVENOUS

## 2016-07-30 MED ORDER — BUDESONIDE 0.25 MG/2ML IN SUSP
0.2500 mg | Freq: Two times a day (BID) | RESPIRATORY_TRACT | Status: DC
  Filled 2016-07-30: qty 2

## 2016-07-30 MED ORDER — CHLORHEXIDINE GLUCONATE 4 % EX LIQD: 60.0000 mL | Freq: Once | CUTANEOUS | Status: DC

## 2016-07-30 MED ORDER — RAMIPRIL 2.5 MG PO CAPS
2.5000 mg | ORAL_CAPSULE | Freq: Every day | ORAL | Status: DC
  Administered 2016-07-30: 2.5 mg via ORAL
  Filled 2016-07-30: qty 1

## 2016-07-30 MED ORDER — ALBUTEROL SULFATE (2.5 MG/3ML) 0.083% IN NEBU: 2.5000 mg | INHALATION_SOLUTION | Freq: Four times a day (QID) | RESPIRATORY_TRACT | Status: DC | PRN

## 2016-07-30 MED ORDER — MIDAZOLAM HCL 2 MG/2ML IJ SOLN
INTRAMUSCULAR | Status: AC
  Filled 2016-07-30: qty 2

## 2016-07-30 MED ORDER — ALLOPURINOL 100 MG PO TABS
100.0000 mg | ORAL_TABLET | Freq: Every day | ORAL | Status: DC
  Administered 2016-07-31: 100 mg via ORAL
  Filled 2016-07-30: qty 1

## 2016-07-30 MED ORDER — INSULIN ASPART 100 UNIT/ML ~~LOC~~ SOLN: 100.0000 [IU] | Freq: Every day | SUBCUTANEOUS | Status: DC

## 2016-07-30 MED ORDER — LEVOTHYROXINE SODIUM 25 MCG PO TABS
137.0000 ug | ORAL_TABLET | Freq: Every day | ORAL | Status: DC
  Administered 2016-07-31 (×2): 137 ug via ORAL
  Filled 2016-07-30: qty 1

## 2016-07-30 MED ORDER — LIDOCAINE HCL (PF) 1 % IJ SOLN
INTRAMUSCULAR | Status: DC | PRN
  Administered 2016-07-30: 20 mL

## 2016-07-30 MED ORDER — FENTANYL CITRATE (PF) 100 MCG/2ML IJ SOLN
INTRAMUSCULAR | Status: DC | PRN
Start: 2016-07-30 — End: 2016-07-30
  Administered 2016-07-30: 50 ug via INTRAVENOUS

## 2016-07-30 MED ORDER — ENOXAPARIN SODIUM 40 MG/0.4ML ~~LOC~~ SOLN: 40.0000 mg | SUBCUTANEOUS | Status: DC

## 2016-07-30 MED ORDER — PROPOFOL 10 MG/ML IV BOLUS
INTRAVENOUS | Status: DC | PRN
  Administered 2016-07-30: 20 mg via INTRAVENOUS

## 2016-07-30 MED ORDER — ACETAMINOPHEN 500 MG PO TABS
1000.0000 mg | ORAL_TABLET | Freq: Every day | ORAL | Status: DC | PRN
  Administered 2016-07-30: 1000 mg via ORAL
  Filled 2016-07-30: qty 2

## 2016-07-30 MED ORDER — FENTANYL CITRATE (PF) 100 MCG/2ML IJ SOLN
25.0000 ug | INTRAMUSCULAR | Status: DC | PRN
  Administered 2016-07-30 (×4): 25 ug via INTRAVENOUS

## 2016-07-30 MED ORDER — ONDANSETRON HCL 4 MG/2ML IJ SOLN: 4.0000 mg | Freq: Once | INTRAMUSCULAR | Status: DC | PRN

## 2016-07-30 MED ORDER — POTASSIUM CHLORIDE CRYS ER 20 MEQ PO TBCR: 20.0000 meq | EXTENDED_RELEASE_TABLET | Freq: Once | ORAL | Status: DC

## 2016-07-30 MED ORDER — V-GO 40 KIT: PACK | Status: DC

## 2016-07-30 MED ORDER — BACITRACIN ZINC 500 UNIT/GM EX OINT
TOPICAL_OINTMENT | CUTANEOUS | Status: DC | PRN
  Administered 2016-07-30: 1 via TOPICAL

## 2016-07-30 MED ORDER — DIPHENHYDRAMINE HCL 25 MG PO TABS
50.0000 mg | ORAL_TABLET | ORAL | Status: DC | PRN
  Filled 2016-07-30: qty 2

## 2016-07-30 MED ORDER — PANTOPRAZOLE SODIUM 40 MG PO TBEC
40.0000 mg | DELAYED_RELEASE_TABLET | Freq: Every day | ORAL | Status: DC
  Administered 2016-07-31: 40 mg via ORAL
  Filled 2016-07-30: qty 1

## 2016-07-30 MED ORDER — AMITRIPTYLINE HCL 25 MG PO TABS
25.0000 mg | ORAL_TABLET | Freq: Every day | ORAL | Status: DC
  Administered 2016-07-30: 25 mg via ORAL
  Filled 2016-07-30: qty 1

## 2016-07-30 MED ORDER — FENTANYL CITRATE (PF) 250 MCG/5ML IJ SOLN
INTRAMUSCULAR | Status: AC
  Filled 2016-07-30: qty 5

## 2016-07-30 MED ORDER — TORSEMIDE 20 MG PO TABS
10.0000 mg | ORAL_TABLET | Freq: Every day | ORAL | Status: DC
  Administered 2016-07-31: 10 mg via ORAL
  Filled 2016-07-30: qty 1

## 2016-07-30 MED ORDER — PHENOL 1.4 % MT LIQD: 1.0000 | OROMUCOSAL | Status: DC | PRN

## 2016-07-30 MED ORDER — SODIUM CHLORIDE 0.9 % IV SOLN: INTRAVENOUS | Status: DC

## 2016-07-30 MED ORDER — METOPROLOL TARTRATE 5 MG/5ML IV SOLN
2.0000 mg | INTRAVENOUS | Status: DC | PRN
Start: 2016-07-30 — End: 2016-07-31

## 2016-07-30 MED ORDER — DULOXETINE HCL 30 MG PO CPEP
30.0000 mg | ORAL_CAPSULE | Freq: Every day | ORAL | Status: DC
  Administered 2016-07-30: 30 mg via ORAL
  Filled 2016-07-30: qty 1

## 2016-07-30 MED ORDER — LABETALOL HCL 5 MG/ML IV SOLN
10.0000 mg | INTRAVENOUS | Status: DC | PRN
  Filled 2016-07-30: qty 4

## 2016-07-30 MED ORDER — SODIUM CHLORIDE 0.9 % IV SOLN
INTRAVENOUS | Status: DC
  Administered 2016-07-30 (×2): via INTRAVENOUS

## 2016-07-30 MED ORDER — INSULIN ASPART 100 UNIT/ML ~~LOC~~ SOLN: 0.0000 [IU] | Freq: Three times a day (TID) | SUBCUTANEOUS | Status: DC

## 2016-07-30 MED ORDER — PHENYLEPHRINE HCL 10 MG/ML IJ SOLN
INTRAMUSCULAR | Status: DC | PRN
  Administered 2016-07-30: 80 ug via INTRAVENOUS

## 2016-07-30 MED ORDER — ONDANSETRON HCL 4 MG/2ML IJ SOLN: 4.0000 mg | Freq: Four times a day (QID) | INTRAMUSCULAR | Status: DC | PRN

## 2016-07-30 MED ORDER — FENTANYL CITRATE (PF) 100 MCG/2ML IJ SOLN
INTRAMUSCULAR | Status: AC
  Filled 2016-07-30: qty 2

## 2016-07-30 MED ORDER — 0.9 % SODIUM CHLORIDE (POUR BTL) OPTIME
TOPICAL | Status: DC | PRN
  Administered 2016-07-30: 1000 mL

## 2016-07-30 MED ORDER — HYDRALAZINE HCL 20 MG/ML IJ SOLN: 5.0000 mg | INTRAMUSCULAR | Status: DC | PRN

## 2016-07-30 MED ORDER — APIXABAN 5 MG PO TABS
5.0000 mg | ORAL_TABLET | Freq: Two times a day (BID) | ORAL | Status: DC
  Administered 2016-07-30 – 2016-07-31 (×2): 5 mg via ORAL
  Filled 2016-07-30 (×2): qty 1

## 2016-07-30 MED ORDER — OXYCODONE-ACETAMINOPHEN 5-325 MG PO TABS
1.0000 | ORAL_TABLET | Freq: Four times a day (QID) | ORAL | Status: DC | PRN
  Administered 2016-07-30 – 2016-07-31 (×3): 1 via ORAL
  Filled 2016-07-30 (×2): qty 1

## 2016-07-30 MED ORDER — ALBUTEROL SULFATE HFA 108 (90 BASE) MCG/ACT IN AERS: 2.0000 | INHALATION_SPRAY | Freq: Four times a day (QID) | RESPIRATORY_TRACT | Status: DC | PRN

## 2016-07-30 MED ORDER — FLUTICASONE PROPIONATE 50 MCG/ACT NA SUSP
2.0000 | Freq: Every day | NASAL | Status: DC
  Administered 2016-07-30: 2 via NASAL
  Filled 2016-07-30: qty 16

## 2016-07-30 MED ORDER — METOPROLOL SUCCINATE ER 25 MG PO TB24
25.0000 mg | ORAL_TABLET | Freq: Every day | ORAL | Status: DC
  Administered 2016-07-31: 25 mg via ORAL
  Filled 2016-07-30: qty 1

## 2016-07-30 MED ORDER — DEXTROSE 5 % IV SOLN
1.5000 g | INTRAVENOUS | Status: AC
  Administered 2016-07-30: 1.5 g via INTRAVENOUS
  Filled 2016-07-30: qty 1.5

## 2016-07-30 MED ORDER — PROPOFOL 500 MG/50ML IV EMUL
INTRAVENOUS | Status: DC | PRN
Start: 2016-07-30 — End: 2016-07-30
  Administered 2016-07-30: 80 ug/kg/min via INTRAVENOUS

## 2016-07-30 SURGICAL SUPPLY — 30 items
BANDAGE ACE 4X5 VEL STRL LF (GAUZE/BANDAGES/DRESSINGS) ×3 IMPLANT
BANDAGE ELASTIC 4 VELCRO ST LF (GAUZE/BANDAGES/DRESSINGS) ×3 IMPLANT
BLADE AVERAGE 25MMX9MM (BLADE)
BLADE AVERAGE 25X9 (BLADE) IMPLANT
BNDG GAUZE ELAST 4 BULKY (GAUZE/BANDAGES/DRESSINGS) ×3 IMPLANT
CANISTER SUCT 3000ML PPV (MISCELLANEOUS) ×3 IMPLANT
COVER SURGICAL LIGHT HANDLE (MISCELLANEOUS) ×3 IMPLANT
DRAPE EXTREMITY T 121X128X90 (DRAPE) ×3 IMPLANT
DRAPE HALF SHEET 40X57 (DRAPES) ×3 IMPLANT
ELECT REM PT RETURN 9FT ADLT (ELECTROSURGICAL) ×3
ELECTRODE REM PT RTRN 9FT ADLT (ELECTROSURGICAL) ×1 IMPLANT
GAUZE SPONGE 4X4 12PLY STRL (GAUZE/BANDAGES/DRESSINGS) ×3 IMPLANT
GLOVE BIO SURGEON STRL SZ7.5 (GLOVE) ×3 IMPLANT
GLOVE BIOGEL PI IND STRL 8 (GLOVE) ×1 IMPLANT
GLOVE BIOGEL PI INDICATOR 8 (GLOVE) ×2
GOWN STRL REUS W/ TWL LRG LVL3 (GOWN DISPOSABLE) ×3 IMPLANT
GOWN STRL REUS W/TWL LRG LVL3 (GOWN DISPOSABLE) ×9
KIT BASIN OR (CUSTOM PROCEDURE TRAY) ×3 IMPLANT
KIT ROOM TURNOVER OR (KITS) ×3 IMPLANT
NEEDLE HYPO 25GX1X1/2 BEV (NEEDLE) ×3 IMPLANT
NS IRRIG 1000ML POUR BTL (IV SOLUTION) ×3 IMPLANT
PACK GENERAL/GYN (CUSTOM PROCEDURE TRAY) ×3 IMPLANT
PAD ARMBOARD 7.5X6 YLW CONV (MISCELLANEOUS) ×6 IMPLANT
SUT ETHILON 3 0 PS 1 (SUTURE) ×6 IMPLANT
SWAB COLLECTION DEVICE MRSA (MISCELLANEOUS) IMPLANT
SWAB CULTURE ESWAB REG 1ML (MISCELLANEOUS) IMPLANT
SYR CONTROL 10ML LL (SYRINGE) ×2 IMPLANT
TOWEL OR 17X26 10 PK STRL BLUE (TOWEL DISPOSABLE) ×3 IMPLANT
UNDERPAD 30X30 (UNDERPADS AND DIAPERS) ×3 IMPLANT
WATER STERILE IRR 1000ML POUR (IV SOLUTION) ×3 IMPLANT

## 2016-07-30 NOTE — Op Note (Signed)
    NAME: Alexis [name]    MRN: [id number] DOB: 11-20-1950    DATE OF OPERATION: 07/30/2016  PREOP DIAGNOSIS:    Osteomyelitis of the right second toe  POSTOP DIAGNOSIS:    Same  PROCEDURE:    RIGHT SECOND TOE AMPUTATION   SURGEON: Judeth Cornfield. Scot Dock, MD, FACS  ASSIST: None  ANESTHESIA: Local with sedation   EBL: Minimal  INDICATIONS:    [name] is a 66 y.o. female who underwent endovascular revascularization of the right lower extremity by Dr. Donzetta Matters. She has single vessel runoff only via the peroneal artery. She has osteomyelitis of the right second toe and I recommended mutation of the right second toe. Her Eliquis was held for 48 hours preoperatively.  FINDINGS:    Good bleeding at the amputation site.  TECHNIQUE:   The patient was taken to the operative room and sedated by anesthesia. The right foot prepped and draped in the usual sterile fashion. The skin was anesthetized first with lidocaine. An incision was made encompassing the necrotic skin and carried down to the proximal phalanx. This was divided with a bone cutter. The edges of the bone were debrided. Hemostasis was obtained using electrocautery. The wound was closed with interrupted 3-0 nylon's. Sterile dressing was applied. The patient tolerated the procedure well and was transferred to the recovery room in stable condition. All needle and sponge counts were correct.  [name], MD, FACS [facility]  DATE OF DICTATION:   07/30/2016

## 2016-07-30 NOTE — Telephone Encounter (Signed)
UDS done today.

## 2016-07-30 NOTE — H&P (View-Only) (Signed)
Patient name: Alexis [name] MRN: [id number] DOB: 1950-05-22 Sex: female  REASON FOR VISIT:    Follow up.  HPI:   Alexis [name] is a 66 y.o. female who I last saw on 05/26/2016. She had originally presented with multiple gangrenous toes of the right foot. She underwent atherectomy of the right superficial femoral artery and popliteal artery with a drug coated balloon angioplasty of both arteries. She has peroneal runoff only on the right. When I saw her last, she had made excellent progress with her wounds and had a brisk peroneal signal with the Doppler on the right. The plan was to see her back in 3 months. She comes in for a follow up visit.   She was sent back early because the wound care center felt that she had osteomyelitis of the second toe on the right foot. I did review the x-ray dated 06/30/2016 which does show findings suggesting osteomyelitis of the distal aspect of the proximal phalanx and proximal aspect of the middle phalanx of the right second toe with possible septic arthritis. She has been on antibiotics.  She denies any rest pain in the right foot.  Past Medical History:  Diagnosis Date  . Allergy   . Anxiety   . Arthritis   . Asthma   . Atrial fibrillation ([place])   . CHF (congestive heart failure) ([place])   . COPD (chronic obstructive pulmonary disease) ([place])   . Diabetes mellitus without complication ([place])   . Hyperlipidemia   . Hypertension   . Myocardial infarction ([place])    several  . Pacemaker    CRT-P with RV lead and His Bundle lead ( high threshold)   . Peripheral neuropathy   . Thyroid disease     Family History  Problem Relation Age of Onset  . Diabetes Mother   . Hyperlipidemia Mother   . Diabetes Father   . Hyperlipidemia Father   . Heart disease Father        before age 80  . Heart attack Father   . Diabetes Brother   . Hyperlipidemia Brother   . Heart attack Brother     SOCIAL HISTORY: Social History  Substance Use Topics  .  Smoking status: Former Smoker    Quit date: 03/01/1998  . Smokeless tobacco: Never Used  . 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    Allergies  Allergen Reactions  . Indomethacin Other (See Comments)     Renal Insufficiency  . Pregabalin Other (See Comments)    DIZZINESS   . Sulfa Antibiotics Hives    Current Outpatient Prescriptions  Medication Sig Dispense Refill  . acetaminophen (TYLENOL) 325 MG tablet Take 2 tablets (650 mg total) by mouth every 4 (four) hours as needed for headache or mild pain.    Marland Kitchen albuterol (PROVENTIL HFA;VENTOLIN HFA) 108 (90 Base) MCG/ACT inhaler Inhale 2 puffs into the lungs every 6 (six) hours as needed for wheezing or shortness of breath. 1 Inhaler 2  . albuterol (PROVENTIL) (2.5 MG/3ML) 0.083% nebulizer solution Take 3 mLs (2.5 mg total) by nebulization every 6 (six) hours as needed for wheezing or shortness of breath. 150 mL 1  . allopurinol (ZYLOPRIM) 100 MG tablet Take ONE (1) tablet by mouth once daily (Patient taking differently: Take 100 mg by mouth at bedtime. ) 90 tablet 3  . amitriptyline (ELAVIL) 25 MG tablet Take 1 tablet (25 mg total) by mouth at bedtime.  30 tablet 0  . apixaban (ELIQUIS) 5 MG TABS tablet Take 1 tablet (5 mg total) by mouth 2 (two) times daily. 180 tablet 1  . Blood Glucose Monitoring Suppl (GLUCOCOM BLOOD GLUCOSE MONITOR) DEVI Use to check blood sugars 6 times daily E11.65    . doxycycline (MONODOX) 100 MG capsule Take 100 mg by mouth 2 (two) times daily.    . DULoxetine (CYMBALTA) 30 MG capsule Take 1 capsule (30 mg total) by mouth daily. 30 capsule 3  . fluticasone (FLONASE) 50 MCG/ACT nasal spray Place 1 spray into both nostrils daily. (Patient taking differently: Place 1 spray into both nostrils at bedtime. Place 1 spray into both nostrils daily.) 16 g 3  . fluticasone (FLOVENT HFA) 110 MCG/ACT inhaler Inhale 2 puffs into the lungs 2 (two) times daily. (Patient taking differently:  Inhale 2 puffs into the lungs as needed (bronchitis). ) 1 Inhaler 3  . Insulin Disposable Pump (V-GO 40) KIT Inject into the skin See admin instructions. Use with Novolog - 6 pumps before each meal    . levothyroxine (SYNTHROID, LEVOTHROID) 175 MCG tablet Take 175 mcg by mouth daily before breakfast.    . metoprolol succinate (TOPROL-XL) 25 MG 24 hr tablet Take 1 tablet by mouth daily.    Marland Kitchen oxyCODONE-acetaminophen (ROXICET) 5-325 MG tablet Take 1 tablet by mouth every 6 (six) hours as needed for severe pain. 24 tablet 0  . Prenatal Vit-Fe Fumarate-FA (MULTIVITAMIN-PRENATAL) 27-0.8 MG TABS tablet Take 1 tablet by mouth daily. Takes for nail and hair growth     . torsemide (DEMADEX) 10 MG tablet Take 1 tablet (10 mg total) by mouth daily.     No current facility-administered medications for this visit.     REVIEW OF SYSTEMS:  [X] denotes positive finding, [ ] denotes negative finding Cardiac  Comments:  Chest pain or chest pressure:    Shortness of breath upon exertion:    Short of breath when lying flat:    Irregular heart rhythm:        Vascular    Pain in calf, thigh, or hip brought on by ambulation:    Pain in feet at night that wakes you up from your sleep:     Blood clot in your veins:    Leg swelling:         Pulmonary    Oxygen at home:    Productive cough:     Wheezing:         Neurologic    Sudden weakness in arms or legs:     Sudden numbness in arms or legs:     Sudden onset of difficulty speaking or slurred speech:    Temporary loss of vision in one eye:     Problems with dizziness:         Gastrointestinal    Blood in stool:     Vomited blood:         Genitourinary    Burning when urinating:     Blood in urine:        Psychiatric    Major depression:         Hematologic    Bleeding problems:    Problems with blood clotting too easily:        Skin    Rashes or ulcers:        Constitutional    Fever or chills:     PHYSICAL EXAM:   Vitals:    07/21/16 0942  BP: 139/62  Pulse: (!) 58  Resp: 20  Temp: (!) 96.8 F (36 C)  TempSrc: Oral  SpO2: 97%  Weight: 224 lb (101.6 kg)  Height: 5' 2" (1.575 m)    GENERAL: The patient is a well-nourished female, in no acute distress. The vital signs are documented above. CARDIAC: There is a regular rate and rhythm.  VASCULAR: She has a brisk peroneal signal with the Doppler on the right and also a brisk posterior tibial signal. There is no dorsalis pedis signal. PULMONARY: There is good air exchange bilaterally without wheezing or rales. ABDOMEN: Soft and non-tender with normal pitched bowel sounds.  MUSCULOSKELETAL: There are no major deformities or cyanosis. NEUROLOGIC: No focal weakness or paresthesias are detected. SKIN: She has an eschar over her proximal right second toe on the dorsal surface. PSYCHIATRIC: The patient has a normal affect.  DATA:    X-ray shows osteomyelitis of the right second toe.  MEDICAL ISSUES:   OSTEOMYELITIS RIGHT SECOND TOE: The patient has ostial myelitis of the right second toe and I have recommended right second toe amputation. I'm somewhat concerned in that the patient only has peroneal runoff on the right. Certainly there is some chance of nonhealing given that her anterior tibial and posterior tibial arteries are occluded with no other options for revascularization. I'm also concerned that the second and third toes are fused proximally and I have explained it is a small chance will also have to take the third toe. Her surgeries been scheduled for 07/30/2016. We will hold her Eliquis 48 hours preoperatively.  Dickson, Christopher [facility] [phone] 

## 2016-07-30 NOTE — Interval H&P Note (Signed)
History and Physical Interval Note:  07/30/2016 3:15 PM  Alexis [name]  has presented today for surgery, with the diagnosis of ischemic toes  The various methods of treatment have been discussed with the patient and family. After consideration of risks, benefits and other options for treatment, the patient has consented to  Procedure(s): AMPUTATION RIGHT SECOND TOE POSSIBLE THIRD TOE (Right) as a surgical intervention .  The patient's history has been reviewed, patient examined, no change in status, stable for surgery.  I have reviewed the patient's chart and labs.  Questions were answered to the patient's satisfaction.     [name]

## 2016-07-30 NOTE — Transfer of Care (Signed)
Immediate Anesthesia Transfer of Care Note  Patient: Alexis [name]  Procedure(s) Performed: Procedure(s): AMPUTATION RIGHT SECOND TOE (Right)  Patient Location: PACU  Anesthesia Type:MAC  Level of Consciousness: awake, alert  and oriented  Airway & Oxygen Therapy: Patient Spontanous Breathing  Post-op Assessment: Report given to RN and Post -op Vital signs reviewed and stable  Post vital signs: Reviewed and stable  Last Vitals:  Vitals:   07/30/16 1300  BP: (!) 170/45  Pulse: 62  Resp: 18  Temp: 36.9 C    Last Pain:  Vitals:   07/30/16 1300  TempSrc: Oral         Complications: No apparent anesthesia complications

## 2016-07-30 NOTE — Telephone Encounter (Signed)
I will sign the oxycodone rx but want her to get uds.

## 2016-07-30 NOTE — Anesthesia Preprocedure Evaluation (Addendum)
Anesthesia Evaluation  Patient identified by MRN, date of birth, ID band Patient awake    Reviewed: Allergy & Precautions, NPO status , Patient's Chart, lab work & pertinent test results  History of Anesthesia Complications (+) PONV and history of anesthetic complications  Airway Mallampati: III  TM Distance: >3 FB Neck ROM: Full    Dental no notable dental hx. (+) Upper Dentures   Pulmonary asthma , COPD (mild, controlled), former smoker,    Pulmonary exam normal breath sounds clear to auscultation       Cardiovascular hypertension, Pt. on home beta blockers + CAD, + Past MI and + Peripheral Vascular Disease  Normal cardiovascular exam+ dysrhythmias Atrial Fibrillation + pacemaker  Rhythm:Regular Rate:Normal  ECHO: MobLeft ventricle: The cavity size was normal. Wall thickness was normal. Systolic function was normal. The estimated ejection fraction was in the range of 50% to 55%. - Left atrium: The atrium was mildly dilated. - Right ventricle: The cavity size was mildly dilated. - Atrial septum: No defect or patent foramen ovale was identified. - Impressions: Overall poor image qualityitz II,  ECG: second degree AV block. Rate 109    Neuro/Psych Anxiety negative neurological ROS     GI/Hepatic negative GI ROS, Neg liver ROS,   Endo/Other  diabetes, Insulin DependentHypothyroidism   Renal/GU negative Renal ROS  negative genitourinary   Musculoskeletal negative musculoskeletal ROS (+)   Abdominal   Peds negative pediatric ROS (+)  Hematology negative hematology ROS (+)   Anesthesia Other Findings Obese, BMI 41 Gout Dr. Curt Bears, Cardiology.  Reproductive/Obstetrics negative OB ROS                            Anesthesia Physical Anesthesia Plan  ASA: III  Anesthesia Plan: MAC   Post-op Pain Management:    Induction: Intravenous  Airway Management Planned: Simple Face Mask and  Nasal Cannula  Additional Equipment:   Intra-op Plan:   Post-operative Plan:   Informed Consent: I have reviewed the patients History and Physical, chart, labs and discussed the procedure including the risks, benefits and alternatives for the proposed anesthesia with the patient or authorized representative who has indicated his/her understanding and acceptance.   Dental advisory given  Plan Discussed with: CRNA  Anesthesia Plan Comments:        Anesthesia Quick Evaluation

## 2016-07-30 NOTE — Telephone Encounter (Signed)
error 

## 2016-07-30 NOTE — Telephone Encounter (Signed)
Last RX: 06/30/16 Last OV:07/20/16 Next VZ:CHYI scheduled  FOY:DXAJ None  CSC:05/27/16

## 2016-07-31 DIAGNOSIS — J449 Chronic obstructive pulmonary disease, unspecified: Secondary | ICD-10-CM | POA: Diagnosis not present

## 2016-07-31 DIAGNOSIS — I481 Persistent atrial fibrillation: Secondary | ICD-10-CM | POA: Diagnosis not present

## 2016-07-31 DIAGNOSIS — E1151 Type 2 diabetes mellitus with diabetic peripheral angiopathy without gangrene: Secondary | ICD-10-CM | POA: Diagnosis not present

## 2016-07-31 DIAGNOSIS — M1990 Unspecified osteoarthritis, unspecified site: Secondary | ICD-10-CM | POA: Diagnosis not present

## 2016-07-31 DIAGNOSIS — F419 Anxiety disorder, unspecified: Secondary | ICD-10-CM | POA: Diagnosis not present

## 2016-07-31 DIAGNOSIS — E1169 Type 2 diabetes mellitus with other specified complication: Secondary | ICD-10-CM | POA: Diagnosis not present

## 2016-07-31 DIAGNOSIS — I509 Heart failure, unspecified: Secondary | ICD-10-CM | POA: Diagnosis not present

## 2016-07-31 DIAGNOSIS — M869 Osteomyelitis, unspecified: Secondary | ICD-10-CM | POA: Diagnosis not present

## 2016-07-31 DIAGNOSIS — I110 Hypertensive heart disease with heart failure: Secondary | ICD-10-CM | POA: Diagnosis not present

## 2016-07-31 LAB — GLUCOSE, CAPILLARY: GLUCOSE-CAPILLARY: 155 mg/dL — AB (ref 65–99)

## 2016-07-31 MED ORDER — DULOXETINE HCL 30 MG PO CPEP: 30.0000 mg | ORAL_CAPSULE | Freq: Every day | ORAL | Status: DC

## 2016-07-31 MED ORDER — OXYCODONE-ACETAMINOPHEN 5-325 MG PO TABS: 1.0000 | ORAL_TABLET | Freq: Four times a day (QID) | ORAL | 0 refills | Status: DC | PRN

## 2016-07-31 MED ORDER — FLUTICASONE PROPIONATE 50 MCG/ACT NA SUSP: 2.0000 | Freq: Every day | NASAL | Status: DC

## 2016-07-31 MED ORDER — FLUTICASONE PROPIONATE HFA 110 MCG/ACT IN AERO: 2.0000 | INHALATION_SPRAY | Freq: Two times a day (BID) | RESPIRATORY_TRACT | Status: DC | PRN

## 2016-07-31 NOTE — Evaluation (Addendum)
Physical Therapy Evaluation Patient Details Name: Alexis [name] MRN: [id number] DOB: [birth year] Today's Date: 07/31/2016   History of Present Illness  Pt is a 66 y/o female admitted with right foot gangrene s/p Left common femoral a cannulation and right angioplasty. s/p R 2nd ray amputation 07/30/16. PMH including but not limited to A-fib, CHF, COPD, DM, HTN, hx of L TKA and history of "several" MI's.   Clinical Impression  Patient is s/p above surgery resulting in functional limitations due to the deficits listed below (see PT Problem List). Pt is currently, supervision for bed mobility, transfers and ambulation of 150 with RW and minA for ascend/descend of 2 steps with RW. Patient will benefit from skilled PT to increase their independence and safety with mobility to allow discharge to the venue listed below.       Follow Up Recommendations No PT follow up    Equipment Recommendations  None recommended by PT    Recommendations for Other Services       Precautions / Restrictions Precautions Precautions: Fall Required Braces or Orthoses: Other Brace/Splint Other Brace/Splint: R Darko shoe Restrictions Weight Bearing Restrictions: Yes RLE Weight Bearing: Partial weight bearing RLE Partial Weight Bearing Percentage or Pounds: through R heel only      Mobility  Bed Mobility Overal bed mobility: Needs Assistance Bed Mobility: Supine to Sit     Supine to sit: Supervision     General bed mobility comments: vc for hand placement for more efficiently scooting to EoB  Transfers Overall transfer level: Needs assistance Equipment used: Rolling walker (2 wheeled) Transfers: Sit to/from Stand Sit to Stand: Supervision         General transfer comment: vc for hand placement for power up  Ambulation/Gait Ambulation/Gait assistance: Supervision Ambulation Distance (Feet): 150 Feet Assistive device: Rolling walker (2 wheeled) Gait Pattern/deviations: Step-through  pattern;Shuffle;Trunk flexed;Decreased dorsiflexion - right Gait velocity: slowed Gait velocity interpretation: Below normal speed for age/gender General Gait Details: slow, steady gait, vc for increased dorsiflexion and knee flexion to clear Darco shoe in swing  Stairs Stairs: Yes Stairs assistance: Min assist Stair Management: Two rails;No rails;Backwards;Forwards;With walker Number of Stairs: 4 General stair comments: pt first ascend/descend with use of handrails to determine sequencing due to L knee weakness from previous knee replacement. Then trained going up with walker due to the fact she does not have handrails at home. Pt able to ascend/descend 2 steps with RW and minA for safety.    Wheelchair Mobility    Modified Rankin (Stroke Patients Only)       Balance Overall balance assessment: No apparent balance deficits (not formally assessed)                                           Pertinent Vitals/Pain Pain Assessment: 0-10 Pain Score: 8  Pain Location: R foot Pain Descriptors / Indicators: Burning;Constant Pain Intervention(s): Monitored during session  VSS    Home Living Family/patient expects to be discharged to:: Private residence Living Arrangements: Spouse/significant other Available Help at Discharge: Family;Available 24 hours/day Type of Home: House Home Access: Stairs to enter Entrance Stairs-Rails: None Entrance Stairs-Number of Steps: 2 Home Layout: One level Home Equipment: Walker - 2 wheels;Bedside commode;Wheelchair - manual      Prior Function Level of Independence: Independent         Comments: limited community ambulation and driver  Hand Dominance        Extremity/Trunk Assessment   Upper Extremity Assessment Upper Extremity Assessment: Overall WFL for tasks assessed    Lower Extremity Assessment Lower Extremity Assessment: RLE deficits/detail RLE Deficits / Details: 2nd ray amputation, ankle ROM and  strength limited Hip and knee ROM and strength WFL RLE: Unable to fully assess due to pain;Unable to fully assess due to immobilization RLE Sensation: history of peripheral neuropathy    Cervical / Trunk Assessment Cervical / Trunk Assessment: Normal  Communication   Communication: No difficulties  Cognition Arousal/Alertness: Awake/alert Behavior During Therapy: WFL for tasks assessed/performed Overall Cognitive Status: Within Functional Limits for tasks assessed                                               Assessment/Plan    PT Assessment Patient needs continued PT services  PT Problem List Decreased strength;Decreased range of motion;Decreased activity tolerance;Decreased mobility;Decreased knowledge of use of DME;Decreased safety awareness;Pain;Impaired sensation       PT Treatment Interventions DME instruction;Gait training;Stair training;Functional mobility training;Therapeutic activities;Therapeutic exercise;Patient/family education    PT Goals (Current goals can be found in the Care Plan section)  Acute Rehab PT Goals Patient Stated Goal: go home PT Goal Formulation: With patient Time For Goal Achievement: 08/14/16 Potential to Achieve Goals: Good    Frequency Min 3X/week    AM-PAC PT "6 Clicks" Daily Activity  Outcome Measure Difficulty turning over in bed (including adjusting bedclothes, sheets and blankets)?: A Little Difficulty moving from lying on back to sitting on the side of the bed? : A Little Difficulty sitting down on and standing up from a chair with arms (e.g., wheelchair, bedside commode, etc,.)?: None Help needed moving to and from a bed to chair (including a wheelchair)?: None Help needed walking in hospital room?: None Help needed climbing 3-5 steps with a railing? : A Little 6 Click Score: 21    End of Session Equipment Utilized During Treatment:  (Darco shoe) Activity Tolerance: Patient tolerated treatment well Patient  left: in chair;with call bell/phone within reach Nurse Communication: Mobility status;Patient requests pain meds;Weight bearing status;Precautions PT Visit Diagnosis: Muscle weakness (generalized) (M62.81);Difficulty in walking, not elsewhere classified (R26.2);Pain;Other abnormalities of gait and mobility (R26.89);Unsteadiness on feet (R26.81) Pain - Right/Left: Right Pain - part of body: Ankle and joints of foot    Time: 7948-0165 PT Time Calculation (min) (ACUTE ONLY): 41 min   Charges:   PT Evaluation $PT Eval Low Complexity: 1 Procedure PT Treatments $Gait Training: 23-37 mins   PT G Codes:   PT G-Codes **NOT FOR INPATIENT CLASS** Functional Assessment Tool Used: AM-PAC 6 Clicks Basic Mobility Functional Limitation: Mobility: Walking and moving around Mobility: Walking and Moving Around Current Status (V3748): At least 20 percent but less than 40 percent impaired, limited or restricted Mobility: Walking and Moving Around Goal Status [phone]): At least 1 percent but less than 20 percent impaired, limited or restricted    Benjamine Mola B. Migdalia Dk PT, DPT Acute Rehabilitation  [phone] Pager [phone]    [place] 07/31/2016, 11:09 AM

## 2016-07-31 NOTE — Progress Notes (Addendum)
  [facility] Progress Note  Subjective  - POD #1  Slept great last night. No complaints.   Objective Vitals:   07/30/16 2039 07/31/16 0424  BP: 110/74 (!) 131/50  Pulse: (!) 105 81  Resp: 18 18  Temp: 97.9 F (36.6 C) 97.7 F (36.5 C)    Intake/Output Summary (Last 24 hours) at 07/31/16 0733 Last data filed at 07/30/16 1637  Gross per 24 hour  Intake              400 ml  Output                5 ml  Net              395 ml   Right 2nd toe amputation site clean with sutures intact. No active bleeding.   Assessment/Planning: 66 y.o. female is s/p: right 2nd toe amputation site 1 Day Post-Op   Eliquis started last night. No bleeding issues. Weightbearing heel only right foot.  D/c home today.  F/u in 2 weeks.   Alvia [place] 07/31/2016 7:33 AM -- Agree with above. D/c home  Ruta Hinds, MD [facility] Office: [phone] Pager: [phone]  Laboratory CBC    Component Value Date/Time   WBC 5.6 07/30/2016 2048   HGB 13.6 07/30/2016 2048   HCT 41.3 07/30/2016 2048   PLT 74 (L) 07/30/2016 2048    BMET    Component Value Date/Time   NA 135 07/30/2016 2048   K 3.9 07/30/2016 2048   CL 101 07/30/2016 2048   CO2 25 07/30/2016 2048   GLUCOSE 167 (H) 07/30/2016 2048   BUN 19 07/30/2016 2048   CREATININE 0.72 07/30/2016 2048   CALCIUM 9.1 07/30/2016 2048   GFRNONAA >60 07/30/2016 2048   GFRAA >60 07/30/2016 2048    COAG Lab Results  Component Value Date   INR 1.09 07/30/2016   INR 1.08 07/30/2016   INR 1.13 04/29/2016   No results found for: PTT  Antibiotics Anti-infectives    Start     Dose/Rate Route Frequency Ordered Stop   07/30/16 1251  cefUROXime (ZINACEF) 1.5 g in dextrose 5 % 50 mL IVPB     1.5 g 100 mL/hr over 30 Minutes Intravenous 30 min pre-op 07/30/16 1251 07/30/16 [place], PA-C [facility] Office: [phone] Pager:  [phone] 07/31/2016 7:33 AM

## 2016-07-31 NOTE — Discharge Instructions (Signed)
Toe Amputation, Care After Refer to this sheet in the next few weeks. These instructions provide you with information on caring for yourself after your procedure. Your health care provider may also give you more specific instructions. Your treatment has been planned according to current medical practices, but problems sometimes occur. Call your health care provider if you have any problems or questions after your procedure. What can I expect after the procedure? After your procedure, it is common to have some pain. Pain usually improves within a week. Follow these instructions at home: Medicines  Take your antibiotic medicine as told by your health care provider. Do not stop taking the antibiotic even if you start to feel better.  Take over-the-counter and prescription medicines only as told by your health care provider. Managing pain, stiffness, and swelling   If directed, apply ice to the surgical area: ? Put ice in a plastic bag. ? Place a towel between your skin and the bag. ? Leave the ice on for 20 minutes, 2-3 times a day.  Raise (elevate) your foot so it is above the level of your heart. This helps to reduce swelling.  Try to walk each day. Incision care   Follow instructions from your health care provider about how to take care of your cut from surgery (incision). Make sure you: ? Wash your hands with soap and water before you change your bandage (dressing). If soap and water are not available, use hand sanitizer. ? Change your dressing as told by your health care provider. ? If you got stitches (sutures), leave them in place. They may need to stay in place for 2 weeks or longer.  Check your incision area every day for signs of infection. Check for: ? More redness, swelling, or pain. ? More fluid or blood. ? Warmth. ? Pus or a bad smell. Bathing  Do not take baths, swim, use a hot tub, or soak your foot until your health care provider approves.  You may shower unless  you were told not to. When you shower, keep your dressing dry.  If your dressing has been removed, you may wash your skin with warm water and soap. Driving  Do not drive for 24 hours if you received a sedative.  Do not drive or operate heavy machinery while taking prescription pain medicine. Activity  Do exercises as told by your health care provider.  Return to your normal activities as told by your health care provider. Ask your health care provider what activities are safe for you. General instructions  Do not use any tobacco products, such as cigarettes, chewing tobacco, and e-cigarettes. If you need help quitting, ask your health care provider.  Ask your health care provider about wearing special shoes or using inserts to support your foot.  If you have diabetes, keep your blood sugar under control.  Keep all follow-up visits as told by your health care provider. This is important. Contact a health care provider if:  You have more redness around your incision.  You have more fluid or blood coming from your incision.  Your incision feels warm to the touch.  You have pus or a bad smell coming from your incision.  You have a fever.  Your dressing is soaked with blood.  Your sutures tear or they separate.  You have numbness or tingling in your toes or foot.  Your foot is cool or pale, or it changes color.  Your pain does not improve after you take your medicine.  Get help right away if:  You have pain or swelling that gets worse or does not go away.  You have red streaks on your skin near your toes, foot, or leg.  You have pain in your calf or behind your knee.  You have shortness of breath.  You have chest pain. This information is not intended to replace advice given to you by your health care provider. Make sure you discuss any questions you have with your health care provider. Document Released: 01/27/2015 Document Revised: 10/20/2015 Document Reviewed:  11/09/2014 Elsevier Interactive Patient Education  [employer].

## 2016-07-31 NOTE — Anesthesia Postprocedure Evaluation (Signed)
Anesthesia Post Note  Patient: Alexis [name]  Procedure(s) Performed: Procedure(s) (LRB): AMPUTATION RIGHT SECOND TOE (Right)     Patient location during evaluation: PACU Anesthesia Type: MAC Level of consciousness: awake and alert Pain management: pain level controlled Vital Signs Assessment: post-procedure vital signs reviewed and stable Respiratory status: spontaneous breathing, nonlabored ventilation, respiratory function stable and patient connected to nasal cannula oxygen Cardiovascular status: stable and blood pressure returned to baseline Anesthetic complications: no    Last Vitals:  Vitals:   07/30/16 2039 07/31/16 0424  BP: 110/74 (!) 131/50  Pulse: (!) 105 81  Resp: 18 18  Temp: 36.6 C 36.5 C    Last Pain:  Vitals:   07/31/16 0700  TempSrc:   PainSc: 0-No pain                 Catalina Gravel

## 2016-08-01 ENCOUNTER — Encounter (HOSPITAL_COMMUNITY): Payer: Self-pay | Admitting: Vascular Surgery

## 2016-08-02 ENCOUNTER — Telehealth: Payer: Self-pay | Admitting: Vascular Surgery

## 2016-08-02 ENCOUNTER — Encounter: Payer: Self-pay | Admitting: Medical

## 2016-08-02 DIAGNOSIS — L97513 Non-pressure chronic ulcer of other part of right foot with necrosis of muscle: Secondary | ICD-10-CM | POA: Diagnosis not present

## 2016-08-02 DIAGNOSIS — L97511 Non-pressure chronic ulcer of other part of right foot limited to breakdown of skin: Secondary | ICD-10-CM | POA: Diagnosis not present

## 2016-08-02 DIAGNOSIS — I252 Old myocardial infarction: Secondary | ICD-10-CM | POA: Diagnosis not present

## 2016-08-02 DIAGNOSIS — J449 Chronic obstructive pulmonary disease, unspecified: Secondary | ICD-10-CM | POA: Diagnosis not present

## 2016-08-02 DIAGNOSIS — E1152 Type 2 diabetes mellitus with diabetic peripheral angiopathy with gangrene: Secondary | ICD-10-CM | POA: Diagnosis not present

## 2016-08-02 DIAGNOSIS — I4891 Unspecified atrial fibrillation: Secondary | ICD-10-CM | POA: Diagnosis not present

## 2016-08-02 DIAGNOSIS — I10 Essential (primary) hypertension: Secondary | ICD-10-CM | POA: Diagnosis not present

## 2016-08-02 DIAGNOSIS — E11621 Type 2 diabetes mellitus with foot ulcer: Secondary | ICD-10-CM | POA: Diagnosis not present

## 2016-08-02 DIAGNOSIS — E785 Hyperlipidemia, unspecified: Secondary | ICD-10-CM | POA: Diagnosis not present

## 2016-08-02 NOTE — Telephone Encounter (Signed)
Sched appt 08/18/16 at 9:00. Spoke to pt to confirm appt.

## 2016-08-02 NOTE — Telephone Encounter (Signed)
-----   Message from Mena Goes, RN sent at 08/02/2016  9:10 AM EDT ----- Regarding: 2 weeks   ----- Message ----- From: Angelia Mould, MD Sent: 07/30/2016   4:49 PM To: [facility] Charge Pool Subject: charge and f/u                                  PROCEDURE:   RIGHT SECOND TOE AMPUTATION   SURGEON: Judeth Cornfield. Scot Dock, MD, FACS  She is staying overnight. She will need a follow up visit in 2 weeks for a wound check. CD

## 2016-08-03 ENCOUNTER — Other Ambulatory Visit: Payer: Self-pay | Admitting: Medical

## 2016-08-03 ENCOUNTER — Telehealth: Payer: Self-pay | Admitting: Medical

## 2016-08-03 NOTE — Telephone Encounter (Deleted)
Ask pt if she already filled the percocet that specialist office gave her. Let me know. Also when was last time I gave her   Call pt and see if she filled the percocet that surgeon office gave her. If she has not then advise her not to fill. Since she is only supposed to get pain medications from me per contract.   Let me know when I last gave her rx. I remember just about 1-2 weeks when she had called for script right before her surgery. So I need to now since if getting from other provider would be breaking the contract.

## 2016-08-03 NOTE — Telephone Encounter (Signed)
Please keep trying to call her. Need to now about the pain meds rx'd since she may ask for refills.

## 2016-08-03 NOTE — Discharge Summary (Signed)
[facility] Discharge Summary  Alexis [name] [birth year] 66 y.o. female  [id number]  Admission Date: 07/30/2016  Discharge Date: 07/31/2016  Physician: [name], MD  Admission Diagnosis: ischemic toes  HPI:   This is a 66 y.o. female who Dr. Scot Dock last saw on 05/26/2016. She had originally presented with multiple gangrenous toes of the right foot. She underwent atherectomy of the right superficial femoral artery and popliteal artery with a drug coated balloon angioplasty of both arteries. She has peroneal runoff only on the right. When Dr. Scot Dock saw her last, she had made excellent progress with her wounds and had a brisk peroneal signal with the Doppler on the right. The plan was to see her back in 3 months. She comes in for a follow up visit.   She was sent back early because the wound care center felt that she had osteomyelitis of the second toe on the right foot. I did review the x-ray dated 06/30/2016 which does show findings suggesting osteomyelitis of the distal aspect of the proximal phalanx and proximal aspect of the middle phalanx of the right second toe with possible septic arthritis. She has been on antibiotics.  She denies any rest pain in the right foot.  Hospital Course:  The patient was admitted to the hospital and taken to the operating room on 07/30/2016 and underwent: amputation right 2nd toe  The patient tolerated the procedure well and was transported to the PACU in stable condition.   She was doing well on POD 1. Her incision was clean and intact without active bleeding. She was ambulating well with a post-op shoe. She was discharged home on POD 1 in good condition.     CBC    Component Value Date/Time   WBC 5.6 07/30/2016 2048   RBC 4.39 07/30/2016 2048   HGB 13.6 07/30/2016 2048   HCT 41.3 07/30/2016 2048   PLT 74 (L) 07/30/2016 2048   MCV 94.1 07/30/2016 2048   MCH 31.0 07/30/2016 2048   MCHC 32.9 07/30/2016 2048     RDW 13.9 07/30/2016 2048   LYMPHSABS 1.4 04/27/2016 1943   MONOABS 0.5 04/27/2016 1943   EOSABS 0.1 04/27/2016 1943   BASOSABS 0.0 04/27/2016 1943    BMET    Component Value Date/Time   NA 135 07/30/2016 2048   K 3.9 07/30/2016 2048   CL 101 07/30/2016 2048   CO2 25 07/30/2016 2048   GLUCOSE 167 (H) 07/30/2016 2048   BUN 19 07/30/2016 2048   CREATININE 0.72 07/30/2016 2048   CALCIUM 9.1 07/30/2016 2048   GFRNONAA >60 07/30/2016 2048   GFRAA >60 07/30/2016 2048     Discharge Instructions:   The patient is discharged to home with extensive instructions on wound care and progressive ambulation.  They are instructed not to drive or perform any heavy lifting until returning to see the physician in his office.  Discharge Instructions    Call MD for:  redness, tenderness, or signs of infection (pain, swelling, bleeding, redness, odor or green/yellow discharge around incision site)    Complete by:  As directed    Call MD for:  severe or increased pain, loss or decreased feeling  in affected limb(s)    Complete by:  As directed    Call MD for:  temperature >100.5    Complete by:  As directed    Discharge wound care:    Complete by:  As directed    Wash right foot daily with soap and  water and pat dry. Apply dry gauze to amputation site and in between right toes. Wrap with gauze wrap and ACE wrap daily.   Driving Restrictions    Complete by:  As directed    No driving for 2 weeks   Increase activity slowly    Complete by:  As directed    Walk with post-op shoe. Weightbearing on right heel.   Resume previous diet    Complete by:  As directed       Discharge Diagnosis:  ischemic toes  Secondary Diagnosis: Patient Active Problem List   Diagnosis Date Noted  . Osteomyelitis ([place]) 07/30/2016  . Gangrene of foot ([place]) 04/27/2016  . Persistent atrial fibrillation ([place])   . Thrombocytopenia ([place])   . Vomiting and diarrhea 04/05/2016  . Hyperlipidemia 11/18/2015  . Gout  08/05/2014  . Cough 08/05/2014  . Diabetes ([place]) 07/12/2014  . Allergic rhinitis 07/12/2014  . Osteoarthritis 07/12/2014  . Asthma 07/12/2014  . CHF (congestive heart failure) ([place]) 07/12/2014  . CAD (coronary artery disease) 07/12/2014  . Atrial fibrillation ([place]) 07/12/2014  . HTN (hypertension) 07/12/2014  . Hypothyroidism 07/12/2014  . History of substance abuse 07/12/2014  . Peripheral vascular disease ([place]) 04/02/2013  . Discoloration of skin 04/02/2013   Past Medical History:  Diagnosis Date  . Allergy   . Anxiety   . Arthritis   . Asthma   . Atrial fibrillation ([place])   . CHF (congestive heart failure) ([place])   . COPD (chronic obstructive pulmonary disease) ([place])   . Diabetes mellitus without complication ([place])   . Hyperlipidemia   . Hypertension   . Myocardial infarction ([place])    several  . Pacemaker    CRT-P with RV lead and His Bundle lead ( high threshold)   . Peripheral neuropathy   . Pneumonia   . PONV (postoperative nausea and vomiting)    unsure of exactly what happened at [facility] in 2010 (knee surgery) that led to crital care  . Thyroid disease      Allergies as of 07/31/2016      Reactions   Indomethacin Other (See Comments)   Renal Insufficiency   Pregabalin Other (See Comments)   DIZZINESS   Sulfa Antibiotics Hives   Morphine And Related Nausea And Vomiting      Medication List    TAKE these medications   acetaminophen 500 MG tablet Commonly known as:  TYLENOL Take 1,000 mg by mouth daily as needed for moderate pain. What changed:  Another medication with the same name was removed. Continue taking this medication, and follow the directions you see here.   albuterol (2.5 MG/3ML) 0.083% nebulizer solution Commonly known as:  PROVENTIL Take 3 mLs (2.5 mg total) by nebulization every 6 (six) hours as needed for wheezing or shortness of breath.   albuterol 108 (90 Base) MCG/ACT inhaler Commonly known as:  PROVENTIL HFA;VENTOLIN  HFA Inhale 2 puffs into the lungs every 6 (six) hours as needed for wheezing or shortness of breath.   allopurinol 100 MG tablet Commonly known as:  ZYLOPRIM Take ONE (1) tablet by mouth once daily   amitriptyline 25 MG tablet Commonly known as:  ELAVIL Take 1 tablet (25 mg total) by mouth at bedtime.   apixaban 5 MG Tabs tablet Commonly known as:  ELIQUIS Take 1 tablet (5 mg total) by mouth 2 (two) times daily.   diphenhydrAMINE 25 MG tablet Commonly known as:  BENADRYL Take 50 mg by mouth as needed for allergies.  DULoxetine 30 MG capsule Commonly known as:  CYMBALTA Take 1 capsule (30 mg total) by mouth at bedtime.   fluticasone 110 MCG/ACT inhaler Commonly known as:  FLOVENT HFA Inhale 2 puffs into the lungs 2 (two) times daily as needed (bronchitis).   fluticasone 50 MCG/ACT nasal spray Commonly known as:  FLONASE Place 2 sprays into both nostrils at bedtime.   GLUCOCOM BLOOD GLUCOSE MONITOR Devi Use to check blood sugars 6 times daily E11.65   levothyroxine 137 MCG tablet Commonly known as:  SYNTHROID, LEVOTHROID Take 137 mcg by mouth daily before breakfast.   metoprolol succinate 25 MG 24 hr tablet Commonly known as:  TOPROL-XL Take 25 mg by mouth daily.   multivitamin-prenatal 27-0.8 MG Tabs tablet Take 1 tablet by mouth daily. Takes for nail and hair growth   NOVOLOG 100 UNIT/ML injection Generic drug:  insulin aspart 100 units per day in VGO unknown bolus   oxyCODONE-acetaminophen 5-325 MG tablet Commonly known as:  ROXICET Take 1 tablet by mouth every 6 (six) hours as needed for severe pain.   ramipril 2.5 MG capsule Commonly known as:  ALTACE Take 2.5 mg by mouth at bedtime.   torsemide 10 MG tablet Commonly known as:  DEMADEX Take 1 tablet (10 mg total) by mouth daily.   V-GO 40 Kit Inject into the skin See admin instructions. Use with Novolog - 6 pumps before each meal       Percocet #20 No Refill  Disposition: Home  Patient's  condition: is Good  Follow up: 1. Dr. Scot Dock in 2 weeks   Virgina Jock, PA-C [facility] [phone] 08/03/2016  12:19 PM

## 2016-08-03 NOTE — Telephone Encounter (Signed)
Ask pt if she already filled the percocet that specialist office gave her. Let me know. Also when was last time I gave her    Call pt and see if she filled the percocet that surgeon office gave her. If she has not then advise her not to fill. Since she is only supposed to get pain medications from me per contract.   Let me know when I last gave her rx. I remember just about 1-2 weeks when she had called for script right before her surgery. So I need to now since if getting from other provider would be breaking the contract.

## 2016-08-03 NOTE — Telephone Encounter (Signed)
Tried to reach pt no answer. Left pt a message to call back.

## 2016-08-04 DIAGNOSIS — J45901 Unspecified asthma with (acute) exacerbation: Secondary | ICD-10-CM | POA: Diagnosis not present

## 2016-08-04 DIAGNOSIS — R269 Unspecified abnormalities of gait and mobility: Secondary | ICD-10-CM | POA: Diagnosis not present

## 2016-08-04 NOTE — Telephone Encounter (Signed)
Left pt a message to call back. 

## 2016-08-05 ENCOUNTER — Telehealth: Payer: Self-pay | Admitting: Medical

## 2016-08-05 NOTE — Telephone Encounter (Signed)
Pt returned call to [place]. She states unsure what it is about. Pt has twenty pills of oxycodone left and she does not like the way it makes her feel so she takes tylenol some. She is fine with her quantity of pills she has right now.pt states she is doing better and does not need anything at this time. Call pt again if needed [phone].

## 2016-08-06 ENCOUNTER — Telehealth: Payer: Self-pay | Admitting: Medical

## 2016-08-06 ENCOUNTER — Other Ambulatory Visit: Payer: Self-pay | Admitting: Interventional Cardiology

## 2016-08-06 ENCOUNTER — Telehealth: Payer: Self-pay

## 2016-08-06 DIAGNOSIS — L089 Local infection of the skin and subcutaneous tissue, unspecified: Secondary | ICD-10-CM

## 2016-08-06 MED ORDER — AMITRIPTYLINE HCL 25 MG PO TABS: 25.0000 mg | ORAL_TABLET | Freq: Every day | ORAL | 0 refills | Status: DC

## 2016-08-06 MED ORDER — DULOXETINE HCL 30 MG PO CPEP: 30.0000 mg | ORAL_CAPSULE | Freq: Every day | ORAL | 0 refills | Status: DC

## 2016-08-06 MED ORDER — TORSEMIDE 10 MG PO TABS: 10.0000 mg | ORAL_TABLET | Freq: Every day | ORAL | 0 refills | Status: DC

## 2016-08-06 NOTE — Telephone Encounter (Signed)
Pt states she can't leave the house until the 20th and that's for a follow up on her surgery.

## 2016-08-06 NOTE — Telephone Encounter (Signed)
I reviewed controlled substance contract and I do not see that she has filled the rx from the surgeon.  It does look like she filled 30 tabs of percocet on 07/30/16 so it is too soon to refill percocet. If she is needing more than 30 tabs/month she will need to follow back up with PCP after he returns to discuss.    OK to send 90 day supplies of elavil, cymbalta, demadex no refills please.

## 2016-08-06 NOTE — Telephone Encounter (Signed)
90 day supply sent to pharmacy

## 2016-08-06 NOTE — Addendum Note (Signed)
Addended by: Hinton Dyer on: 08/06/2016 10:55 AM   Modules accepted: Orders

## 2016-08-06 NOTE — Telephone Encounter (Signed)
Pt states she has not received percocet from specialist she will call and cancel rx. Pt states she is trying to cut back and olny se once a day. Pt also states she need 90 day supply of medication sent to [facility]. Please advise.

## 2016-08-06 NOTE — Addendum Note (Signed)
Addended by: Debbrah Alar on: 08/06/2016 11:03 AM   Modules accepted: Orders

## 2016-08-06 NOTE — Telephone Encounter (Signed)
Pt needs ret call regarding [employer] medications and help with mobility scooter. She called the man for the scooter and he is no longer there. Please advise concerning both issues.  [phone].

## 2016-08-06 NOTE — Telephone Encounter (Signed)
Noted  

## 2016-08-06 NOTE — Telephone Encounter (Signed)
Received letter from [employer] stating pt needs referral to PT to have wheelchair/scooter evaluation to determine which would be better for her. PT will document everything they need in their notes

## 2016-08-06 NOTE — Telephone Encounter (Signed)
Left pt a message to call back. 

## 2016-08-06 NOTE — Addendum Note (Signed)
Addended by: Hinton Dyer on: 08/06/2016 11:09 AM   Modules accepted: Orders

## 2016-08-06 NOTE — Telephone Encounter (Signed)
Please advise patient that [place] controlled substance prescribing law allows for no more than 7 days of narcotics following a surgical procedure without re-evaluation.  She received 30 tabs on 6/1 which covers this 7 day period. We will not be able to provide her with any refills without seeing her back in the office.

## 2016-08-09 ENCOUNTER — Encounter: Payer: Self-pay | Admitting: Vascular Surgery

## 2016-08-09 ENCOUNTER — Encounter: Payer: Self-pay | Admitting: Family

## 2016-08-09 ENCOUNTER — Telehealth: Payer: Self-pay | Admitting: Medical

## 2016-08-09 NOTE — Telephone Encounter (Signed)
Pt called says medical supply company is re-faxing everything. Pt states it was error to say motorized wheelchair. Correct equipment pt needs is scooter.

## 2016-08-10 NOTE — Telephone Encounter (Signed)
Received Physician Orders from [place] for [employer], paperwork filled out for Power chair and/or Armed forces logistics/support/administrative officer; forwarded to Dr. Carollee Herter to sign-off on, as supervising provider; as paperwork does have her name on it also/SLS 06/12

## 2016-08-11 DIAGNOSIS — I252 Old myocardial infarction: Secondary | ICD-10-CM | POA: Diagnosis not present

## 2016-08-11 DIAGNOSIS — E1152 Type 2 diabetes mellitus with diabetic peripheral angiopathy with gangrene: Secondary | ICD-10-CM | POA: Diagnosis not present

## 2016-08-11 DIAGNOSIS — E11621 Type 2 diabetes mellitus with foot ulcer: Secondary | ICD-10-CM | POA: Diagnosis not present

## 2016-08-11 DIAGNOSIS — J449 Chronic obstructive pulmonary disease, unspecified: Secondary | ICD-10-CM | POA: Diagnosis not present

## 2016-08-11 DIAGNOSIS — L97511 Non-pressure chronic ulcer of other part of right foot limited to breakdown of skin: Secondary | ICD-10-CM | POA: Diagnosis not present

## 2016-08-11 DIAGNOSIS — L97513 Non-pressure chronic ulcer of other part of right foot with necrosis of muscle: Secondary | ICD-10-CM | POA: Diagnosis not present

## 2016-08-11 DIAGNOSIS — I10 Essential (primary) hypertension: Secondary | ICD-10-CM | POA: Diagnosis not present

## 2016-08-11 DIAGNOSIS — I4891 Unspecified atrial fibrillation: Secondary | ICD-10-CM | POA: Diagnosis not present

## 2016-08-11 DIAGNOSIS — E785 Hyperlipidemia, unspecified: Secondary | ICD-10-CM | POA: Diagnosis not present

## 2016-08-12 ENCOUNTER — Ambulatory Visit (HOSPITAL_COMMUNITY): Payer: Medicare HMO

## 2016-08-16 NOTE — Telephone Encounter (Signed)
Resubmitted corrected form to [employer].

## 2016-08-16 NOTE — Telephone Encounter (Signed)
Relation to pt: self  Call back number: [phone]   Reason for call:  Patient states forms indicated scooter or wheelchair and it needs to state scooter only, patient would like to speak with you. Company has resent form back last week, please advise [phone]

## 2016-08-18 ENCOUNTER — Encounter: Payer: Self-pay | Admitting: Family

## 2016-08-18 ENCOUNTER — Ambulatory Visit (INDEPENDENT_AMBULATORY_CARE_PROVIDER_SITE_OTHER): Payer: Self-pay | Admitting: Family

## 2016-08-18 ENCOUNTER — Telehealth: Payer: Self-pay

## 2016-08-18 ENCOUNTER — Ambulatory Visit: Payer: Medicare HMO | Admitting: Family

## 2016-08-18 VITALS — BP 150/80 | HR 82 | Temp 97.2°F | Resp 20 | Ht 62.0 in | Wt 225.0 lb

## 2016-08-18 DIAGNOSIS — I779 Disorder of arteries and arterioles, unspecified: Secondary | ICD-10-CM

## 2016-08-18 DIAGNOSIS — Z8739 Personal history of other diseases of the musculoskeletal system and connective tissue: Secondary | ICD-10-CM

## 2016-08-18 DIAGNOSIS — Z89421 Acquired absence of other right toe(s): Secondary | ICD-10-CM

## 2016-08-18 DIAGNOSIS — E1151 Type 2 diabetes mellitus with diabetic peripheral angiopathy without gangrene: Secondary | ICD-10-CM

## 2016-08-18 DIAGNOSIS — S98131A Complete traumatic amputation of one right lesser toe, initial encounter: Secondary | ICD-10-CM

## 2016-08-18 DIAGNOSIS — Z87891 Personal history of nicotine dependence: Secondary | ICD-10-CM

## 2016-08-18 NOTE — Progress Notes (Signed)
Postoperative Visit   History of Present Illness  Alexis [name] is a 66 y.o. female who presents for postoperative follow-up s/p right second toe amputation on 07-30-16 by Dr. Scot Dock for osteomyelitis of the right second toe.   Previous to this she  underwent endovascular revascularization of the right lower extremity by Dr. Donzetta Matters. She has single vessel runoff only via the peroneal artery. She had osteomyelitis of the right second toe and Dr. Scot Dock recommended amputation of the right second toe. Her Eliquis was held for 48 hours preoperatively.  The patient's right second toe amputation site has healed. The patient notes pain is well controlled.  The patient's current symptoms are: none.  She quit smoking in 2000. She has IDDM; last A1C result on file was 9.0 on 04-03-15, a year ago. Last serum creatinine result was normal at 0.72 on 07-30-16 (review of records).    For VQI Use Only  PRE-ADM LIVING: Home  AMB STATUS: Ambulatory   Past Medical History:  Diagnosis Date  . Allergy   . Anxiety   . Arthritis   . Asthma   . Atrial fibrillation ([place])   . CHF (congestive heart failure) ([place])   . COPD (chronic obstructive pulmonary disease) ([place])   . Diabetes mellitus without complication ([place])   . Hyperlipidemia   . Hypertension   . Myocardial infarction ([place])    several  . Pacemaker    CRT-P with RV lead and His Bundle lead ( high threshold)   . Peripheral neuropathy   . Pneumonia   . PONV (postoperative nausea and vomiting)    unsure of exactly what happened at [facility] in 2010 (knee surgery) that led to crital care  . Thyroid disease     Past Surgical History:  Procedure Laterality Date  . ABDOMINAL AORTOGRAM W/LOWER EXTREMITY N/A 04/29/2016   Procedure: Abdominal Aortogram w/Lower Extremity;  Surgeon: Waynetta Sandy, MD;  Location: [place] [facility];  Service: Cardiovascular;  Laterality: N/A;  . ABDOMINAL HYSTERECTOMY    . AMPUTATION Right  07/30/2016   Procedure: AMPUTATION RIGHT SECOND TOE;  Surgeon: Angelia Mould, MD;  Location: [place];  Service: Vascular;  Laterality: Right;  . CARDIAC CATHETERIZATION     2005 at [facility]  . PERIPHERAL VASCULAR ATHERECTOMY Right 04/29/2016   Procedure: Peripheral Vascular Atherectomy-Right Popliteal;  Surgeon: Waynetta Sandy, MD;  Location: [place] [facility];  Service: Cardiovascular;  Laterality: Right;  . PERIPHERAL VASCULAR INTERVENTION Right 04/29/2016   Procedure: Peripheral Vascular Intervention-Right Popliteal;  Surgeon: Waynetta Sandy, MD;  Location: [place] [facility];  Service: Cardiovascular;  Laterality: Right;  POPLITEAL PTA  . RADIOFREQUENCY ABLATION    . TOTAL KNEE ARTHROPLASTY    . TUBAL LIGATION      Social History   Social History  . Marital status: Married    Spouse name: N/A  . Number of children: N/A  . Years of education: N/A   Occupational History  . retired    Social History Main Topics  . Smoking status: Former Smoker    Quit date: 03/01/1998  . Smokeless tobacco: Never Used  . 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  . Drug use: No     Comment: remote use  . Sexual activity: No   Other Topics Concern  . Not on file   Social History Narrative  . No narrative on file  Allergies  Allergen Reactions  . Indomethacin Other (See Comments)     Renal Insufficiency  . Pregabalin Other (See Comments)    DIZZINESS   . Sulfa Antibiotics Hives  . Morphine And Related Nausea And Vomiting    Current Outpatient Prescriptions on File Prior to Visit  Medication Sig Dispense Refill  . acetaminophen (TYLENOL) 500 MG tablet Take 1,000 mg by mouth daily as needed for moderate pain.    Marland Kitchen albuterol (PROVENTIL HFA;VENTOLIN HFA) 108 (90 Base) MCG/ACT inhaler Inhale 2 puffs into the lungs every 6 (six) hours as needed for wheezing or shortness of breath. 1 Inhaler 2  . albuterol  (PROVENTIL) (2.5 MG/3ML) 0.083% nebulizer solution Take 3 mLs (2.5 mg total) by nebulization every 6 (six) hours as needed for wheezing or shortness of breath. 150 mL 1  . allopurinol (ZYLOPRIM) 100 MG tablet Take ONE (1) tablet by mouth once daily 90 tablet 3  . amitriptyline (ELAVIL) 25 MG tablet Take 1 tablet (25 mg total) by mouth at bedtime. 90 tablet 0  . apixaban (ELIQUIS) 5 MG TABS tablet Take 1 tablet (5 mg total) by mouth 2 (two) times daily. 180 tablet 1  . Blood Glucose Monitoring Suppl (GLUCOCOM BLOOD GLUCOSE MONITOR) DEVI Use to check blood sugars 6 times daily E11.65    . diphenhydrAMINE (BENADRYL) 25 MG tablet Take 50 mg by mouth as needed for allergies.    . DULoxetine (CYMBALTA) 30 MG capsule Take 1 capsule (30 mg total) by mouth at bedtime. 90 capsule 0  . fluticasone (FLONASE) 50 MCG/ACT nasal spray Place 2 sprays into both nostrils at bedtime.    . fluticasone (FLOVENT HFA) 110 MCG/ACT inhaler Inhale 2 puffs into the lungs 2 (two) times daily as needed (bronchitis).    . Insulin Disposable Pump (V-GO 40) KIT Inject into the skin See admin instructions. Use with Novolog - 6 pumps before each meal    . levothyroxine (SYNTHROID, LEVOTHROID) 137 MCG tablet Take 137 mcg by mouth daily before breakfast.    . metoprolol succinate (TOPROL-XL) 25 MG 24 hr tablet Take 25 mg by mouth daily.     Marland Kitchen NOVOLOG 100 UNIT/ML injection 100 units per day in VGO unknown bolus    . oxyCODONE-acetaminophen (ROXICET) 5-325 MG tablet Take 1 tablet by mouth every 6 (six) hours as needed for severe pain. 20 tablet 0  . Prenatal Vit-Fe Fumarate-FA (MULTIVITAMIN-PRENATAL) 27-0.8 MG TABS tablet Take 1 tablet by mouth daily. Takes for nail and hair growth     . ramipril (ALTACE) 2.5 MG capsule Take 2.5 mg by mouth at bedtime.    . torsemide (DEMADEX) 10 MG tablet Take 1 tablet (10 mg total) by mouth daily. 90 tablet 0   No current facility-administered medications on file prior to visit.      Physical  Examination  Vitals:   08/18/16 0914 08/18/16 0921  BP: (!) 152/77 (!) 150/80  Pulse: 82   Resp: 20   Temp: 97.2 F (36.2 C)   TempSrc: Oral   SpO2: 100%   Weight: 225 lb (102.1 kg)   Height: _0  (1.575 m)    Body mass index is 41.15 kg/m.   Right PT and peroneal arteries with + Doppler signals. Right second toe amputation site is well healed with no signs of infection, no swelling, no drainage.  Sutures intact.   Medical Decision Making  Alexis [name] is a 66 y.o. female who presents s/p right second toe amputation on 07-30-16 for osteomyelitis  of the right second toe.   Dr. Donzetta Matters spoke with and examined pt.  All sutures removed today. May shower. Wear protective footwear.  Follow up in 1-2 months with bilateral ABI and right LE arterial duplex, see Dr. Scot Dock afterward; on subsequent visits may follow up with me as deemed by Dr. Scot Dock.   I discussed in depth with the patient the nature of atherosclerosis, and emphasized the importance of maximal medical management including strict control of blood pressure, blood glucose, and lipid levels, obtaining regular exercise, and cessation of smoking.  The patient is aware that without maximal medical management the underlying atherosclerotic disease process will progress, possibly leading to a more proximal amputation. The patient agrees to participate in their maximal medical care.  Thank you for allowing [place] to participate in this patient's care.   NICKEL, Sharmon Leyden, RN, MSN, FNP-C [facility] Office: [phone]  08/18/2016, 9:33 AM  Clinic MD: Donzetta Matters

## 2016-08-18 NOTE — Patient Instructions (Signed)

## 2016-08-18 NOTE — Telephone Encounter (Signed)
Spoke with pt about evaluation for mobile scooter. Pt states she called to cancel evaluation bese she was told it was to evaluate if she needed physical therapy and she already has a physical therapist . I explained to pt that they are coming to evaluate her for mobile scooter so they fix her for the correct one. Pt states she's calling back to set up date for them to come out. Also spoke with [facility] at [facility] medical solutions pt evaluation has to be in a office note singed off by PCP.

## 2016-08-19 ENCOUNTER — Telehealth: Payer: Self-pay | Admitting: Medical

## 2016-08-19 NOTE — Telephone Encounter (Signed)
I did see her regarding need for scooter. Please see my last note. She does not need to see me again specifically for this.

## 2016-08-19 NOTE — Telephone Encounter (Signed)
Seen note. I spoke with  [facility] at [facility] solutions she stated that pt has to be evaluated for motor scooter evaluation  has to been an office note signed off by PCP or PA for motor scooter will not get approved insurance will not pay.

## 2016-08-19 NOTE — Addendum Note (Signed)
Addended by: Lianne Cure A on: 08/19/2016 12:00 PM   Modules accepted: Orders

## 2016-08-20 ENCOUNTER — Telehealth: Payer: Self-pay | Admitting: Medical

## 2016-08-20 DIAGNOSIS — I10 Essential (primary) hypertension: Secondary | ICD-10-CM | POA: Diagnosis not present

## 2016-08-20 DIAGNOSIS — I252 Old myocardial infarction: Secondary | ICD-10-CM | POA: Diagnosis not present

## 2016-08-20 DIAGNOSIS — L97511 Non-pressure chronic ulcer of other part of right foot limited to breakdown of skin: Secondary | ICD-10-CM | POA: Diagnosis not present

## 2016-08-20 DIAGNOSIS — E1152 Type 2 diabetes mellitus with diabetic peripheral angiopathy with gangrene: Secondary | ICD-10-CM | POA: Diagnosis not present

## 2016-08-20 DIAGNOSIS — E11621 Type 2 diabetes mellitus with foot ulcer: Secondary | ICD-10-CM | POA: Diagnosis not present

## 2016-08-20 DIAGNOSIS — E785 Hyperlipidemia, unspecified: Secondary | ICD-10-CM | POA: Diagnosis not present

## 2016-08-20 DIAGNOSIS — L97513 Non-pressure chronic ulcer of other part of right foot with necrosis of muscle: Secondary | ICD-10-CM | POA: Diagnosis not present

## 2016-08-20 DIAGNOSIS — I4891 Unspecified atrial fibrillation: Secondary | ICD-10-CM | POA: Diagnosis not present

## 2016-08-20 DIAGNOSIS — J449 Chronic obstructive pulmonary disease, unspecified: Secondary | ICD-10-CM | POA: Diagnosis not present

## 2016-08-20 NOTE — Telephone Encounter (Signed)
Please see the 07-20-2016 note. Fax this to them. Also I filled out a form as well.

## 2016-08-25 ENCOUNTER — Ambulatory Visit: Payer: Medicare HMO | Admitting: Vascular Surgery

## 2016-08-26 ENCOUNTER — Telehealth: Payer: Self-pay | Admitting: Medical

## 2016-08-26 DIAGNOSIS — I10 Essential (primary) hypertension: Secondary | ICD-10-CM | POA: Diagnosis not present

## 2016-08-26 DIAGNOSIS — J449 Chronic obstructive pulmonary disease, unspecified: Secondary | ICD-10-CM | POA: Diagnosis not present

## 2016-08-26 DIAGNOSIS — I4891 Unspecified atrial fibrillation: Secondary | ICD-10-CM | POA: Diagnosis not present

## 2016-08-26 DIAGNOSIS — I252 Old myocardial infarction: Secondary | ICD-10-CM | POA: Diagnosis not present

## 2016-08-26 DIAGNOSIS — E1152 Type 2 diabetes mellitus with diabetic peripheral angiopathy with gangrene: Secondary | ICD-10-CM | POA: Diagnosis not present

## 2016-08-26 DIAGNOSIS — E785 Hyperlipidemia, unspecified: Secondary | ICD-10-CM | POA: Diagnosis not present

## 2016-08-26 DIAGNOSIS — L97513 Non-pressure chronic ulcer of other part of right foot with necrosis of muscle: Secondary | ICD-10-CM | POA: Diagnosis not present

## 2016-08-26 DIAGNOSIS — E11621 Type 2 diabetes mellitus with foot ulcer: Secondary | ICD-10-CM | POA: Diagnosis not present

## 2016-08-26 DIAGNOSIS — L97511 Non-pressure chronic ulcer of other part of right foot limited to breakdown of skin: Secondary | ICD-10-CM | POA: Diagnosis not present

## 2016-08-26 NOTE — Telephone Encounter (Signed)
Caller name: Relation to [facility]:SLHT Call back [place]:  Reason for call: pt is needing [facility] to be contacted to get a PT out to her home to get her qualified for a mobile scooter.

## 2016-08-26 NOTE — Telephone Encounter (Signed)
Yes they can evaluate for scooter. I already saw her for this but there evaluation may help.

## 2016-08-26 NOTE — Telephone Encounter (Signed)
Sent msg to [employer] with [facility] as we previously referred pt for PT to [facility]. Asking if PT can eval for motorized scooter.

## 2016-08-27 ENCOUNTER — Ambulatory Visit (HOSPITAL_BASED_OUTPATIENT_CLINIC_OR_DEPARTMENT_OTHER)
Admission: RE | Admit: 2016-08-27 | Discharge: 2016-08-27 | Disposition: A | Discharge status: Home / Self Care | Payer: Medicare HMO | Source: Ambulatory Visit | Attending: Medical | Admitting: Medical

## 2016-08-27 ENCOUNTER — Encounter: Payer: Self-pay | Admitting: Medical

## 2016-08-27 ENCOUNTER — Ambulatory Visit (INDEPENDENT_AMBULATORY_CARE_PROVIDER_SITE_OTHER): Payer: Medicare HMO | Admitting: Medical

## 2016-08-27 VITALS — BP 130/93 | HR 98 | Temp 98.0°F | Resp 16

## 2016-08-27 DIAGNOSIS — M7989 Other specified soft tissue disorders: Secondary | ICD-10-CM | POA: Diagnosis not present

## 2016-08-27 DIAGNOSIS — M25572 Pain in left ankle and joints of left foot: Secondary | ICD-10-CM | POA: Insufficient documentation

## 2016-08-27 DIAGNOSIS — Z8739 Personal history of other diseases of the musculoskeletal system and connective tissue: Secondary | ICD-10-CM | POA: Diagnosis not present

## 2016-08-27 LAB — CBC WITH DIFFERENTIAL/PLATELET
BASOS ABS: 0 10*3/uL (ref 0.0–0.1)
Basophils Relative: 0.5 % (ref 0.0–3.0)
Eosinophils Absolute: 0.1 10*3/uL (ref 0.0–0.7)
Eosinophils Relative: 1.1 % (ref 0.0–5.0)
HCT: 37.6 % (ref 36.0–46.0)
Hemoglobin: 12.7 g/dL (ref 12.0–15.0)
LYMPHS ABS: 1.2 10*3/uL (ref 0.7–4.0)
Lymphocytes Relative: 19.9 % (ref 12.0–46.0)
MCHC: 33.8 g/dL (ref 30.0–36.0)
MCV: 95.2 fl (ref 78.0–100.0)
MONO ABS: 0.6 10*3/uL (ref 0.1–1.0)
Monocytes Relative: 9.4 % (ref 3.0–12.0)
NEUTROS ABS: 4.3 10*3/uL (ref 1.4–7.7)
NEUTROS PCT: 69.1 % (ref 43.0–77.0)
PLATELETS: 85 10*3/uL — AB (ref 150.0–400.0)
RBC: 3.95 Mil/uL (ref 3.87–5.11)
RDW: 15.3 % (ref 11.5–15.5)
WBC: 6.2 10*3/uL (ref 4.0–10.5)

## 2016-08-27 LAB — URIC ACID: URIC ACID, SERUM: 7.7 mg/dL — AB (ref 2.4–7.0)

## 2016-08-27 NOTE — Progress Notes (Signed)
Subjective:    Patient ID: Alexis [name], female    DOB: 1950-12-05, 66 y.o.   MRN: [id number]  HPI  Pt in for follow up.  She states rt foot feels better. She had 2nd toe amputates since last visit. Pain level controlled presently.(Pt is on pain medication written by specialist). She filled this by accident.  Pt has left medial ankle pain since Saturday. Some swelling. Pt is diabetic. She is on allopurinol. Years since any gout flare. It has 3 years since gout flare.  Pt has diabetes and her sugar have been less than 200 but in past her sugars have been very high.   Review of Systems  Constitutional: Negative for chills, fatigue and fever.  Respiratory: Negative for cough, chest tightness, shortness of breath and wheezing.   Cardiovascular: Negative for chest pain and palpitations.  Gastrointestinal: Negative for abdominal pain.  Musculoskeletal:       Lt ankle pain.   Past Medical History:  Diagnosis Date  . Allergy   . Anxiety   . Arthritis   . Asthma   . Atrial fibrillation ([place])   . CHF (congestive heart failure) ([place])   . COPD (chronic obstructive pulmonary disease) ([place])   . Diabetes mellitus without complication ([place])   . Hyperlipidemia   . Hypertension   . Myocardial infarction ([place])    several  . Pacemaker    CRT-P with RV lead and His Bundle lead ( high threshold)   . Peripheral neuropathy   . Pneumonia   . PONV (postoperative nausea and vomiting)    unsure of exactly what happened at [facility] in 2010 (knee surgery) that led to crital care  . Thyroid disease      Social History   Social History  . Marital status: Married    Spouse name: N/A  . Number of children: N/A  . Years of education: N/A   Occupational History  . retired    Social History Main Topics  . Smoking status: Former Smoker    Quit date: 03/01/1998  . Smokeless tobacco: Never Used  . 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   . Drug use: No     Comment: remote use  . Sexual activity: No   Other Topics Concern  . Not on file   Social History Narrative  . No narrative on file    Past Surgical History:  Procedure Laterality Date  . ABDOMINAL AORTOGRAM W/LOWER EXTREMITY N/A 04/29/2016   Procedure: Abdominal Aortogram w/Lower Extremity;  Surgeon: Waynetta Sandy, MD;  Location: [place] [facility];  Service: Cardiovascular;  Laterality: N/A;  . ABDOMINAL HYSTERECTOMY    . AMPUTATION Right 07/30/2016   Procedure: AMPUTATION RIGHT SECOND TOE;  Surgeon: Angelia Mould, MD;  Location: [place];  Service: Vascular;  Laterality: Right;  . CARDIAC CATHETERIZATION     2005 at [facility]  . PERIPHERAL VASCULAR ATHERECTOMY Right 04/29/2016   Procedure: Peripheral Vascular Atherectomy-Right Popliteal;  Surgeon: Waynetta Sandy, MD;  Location: [place] [facility];  Service: Cardiovascular;  Laterality: Right;  . PERIPHERAL VASCULAR INTERVENTION Right 04/29/2016   Procedure: Peripheral Vascular Intervention-Right Popliteal;  Surgeon: Waynetta Sandy, MD;  Location: [place] [facility];  Service: Cardiovascular;  Laterality: Right;  POPLITEAL PTA  . RADIOFREQUENCY ABLATION    . TOTAL KNEE ARTHROPLASTY    . TUBAL LIGATION      Family History  Problem Relation Age of Onset  . Diabetes Mother   . Hyperlipidemia Mother   . Diabetes Father   . Hyperlipidemia Father   . Heart disease Father        before age 79  . Heart attack Father   . Diabetes Brother   . Hyperlipidemia Brother   . Heart attack Brother     Allergies  Allergen Reactions  . Indomethacin Other (See Comments)     Renal Insufficiency  . Pregabalin Other (See Comments)    DIZZINESS   . Sulfa Antibiotics Hives  . Morphine And Related Nausea And Vomiting    Current Outpatient Prescriptions on File Prior to Visit  Medication Sig Dispense Refill  . acetaminophen (TYLENOL) 500 MG tablet Take 1,000 mg by mouth daily  as needed for moderate pain.    Marland Kitchen albuterol (PROVENTIL HFA;VENTOLIN HFA) 108 (90 Base) MCG/ACT inhaler Inhale 2 puffs into the lungs every 6 (six) hours as needed for wheezing or shortness of breath. 1 Inhaler 2  . albuterol (PROVENTIL) (2.5 MG/3ML) 0.083% nebulizer solution Take 3 mLs (2.5 mg total) by nebulization every 6 (six) hours as needed for wheezing or shortness of breath. 150 mL 1  . allopurinol (ZYLOPRIM) 100 MG tablet Take ONE (1) tablet by mouth once daily 90 tablet 3  . amitriptyline (ELAVIL) 25 MG tablet Take 1 tablet (25 mg total) by mouth at bedtime. 90 tablet 0  . apixaban (ELIQUIS) 5 MG TABS tablet Take 1 tablet (5 mg total) by mouth 2 (two) times daily. 180 tablet 1  . Blood Glucose Monitoring Suppl (GLUCOCOM BLOOD GLUCOSE MONITOR) DEVI Use to check blood sugars 6 times daily E11.65    . diphenhydrAMINE (BENADRYL) 25 MG tablet Take 50 mg by mouth as needed for allergies.    . DULoxetine (CYMBALTA) 30 MG capsule Take 1 capsule (30 mg total) by mouth at bedtime. 90 capsule 0  . fluticasone (FLONASE) 50 MCG/ACT nasal spray Place 2 sprays into both nostrils at bedtime.    . fluticasone (FLOVENT HFA) 110 MCG/ACT inhaler Inhale 2 puffs into the lungs 2 (two) times daily as needed (bronchitis).    . Insulin Disposable Pump (V-GO 40) KIT Inject into the skin See admin instructions. Use with Novolog - 6 pumps before each meal    . levothyroxine (SYNTHROID, LEVOTHROID) 137 MCG tablet Take 137 mcg by mouth daily before breakfast.    . metoprolol succinate (TOPROL-XL) 25 MG 24 hr tablet Take 25 mg by mouth daily.     Marland Kitchen NOVOLOG 100 UNIT/ML injection 100 units per day in VGO unknown bolus    . oxyCODONE-acetaminophen (ROXICET) 5-325 MG tablet Take 1 tablet by mouth every 6 (six) hours as needed for severe pain. 20 tablet 0  . Prenatal Vit-Fe Fumarate-FA (MULTIVITAMIN-PRENATAL) 27-0.8 MG TABS tablet Take 1 tablet by mouth daily. Takes for nail and hair growth     . ramipril (ALTACE) 2.5 MG  capsule Take 2.5 mg by mouth at bedtime.    . torsemide (DEMADEX) 10 MG tablet Take 1 tablet (10 mg total) by mouth daily. 90 tablet 0   No current facility-administered medications on file prior to visit.     BP (!) 130/93 (BP Location: Right Arm, Patient Position: Sitting, Cuff Size: Large)   Pulse 98   Temp 98 F (36.7 C) (Oral)   Resp 16   SpO2 97%      Objective:   Physical Exam   General- No acute distress. Pleasant patient. Neck- Full  range of motion, no jvd Lungs- Clear, even and unlabored. Heart- regular rate and rhythm. Neurologic- CNII- XII grossly intact.  Lt ankle- medial aspect mild red and warm.Tender to touch lightly. No fluctuance. No breakdown of skin.     Assessment & Plan:  For left ankle pain and gout get uric acid, cbc and xray.  Use your pain medication until we get results back.  Limited choice of med treatment if gout as you have diabetes and use eliquis. This makes it  risky to use prednisone or nsaids. So will likely rx colchicine when I get results back.(may do this even if uric acid not elevated)  In future no secondary sources of pain meds. Be very cautious even post surgery. Don't accept med. Have other provider call us first so we can discuss.  We have arranged for PT to come to your house to evaluate  you for the scooter.  Follow up in 10 days or as needed  Tonique Mendonca, Percell Miller, [employer]

## 2016-08-27 NOTE — Patient Instructions (Addendum)
For left ankle pain and gout get uric acid, cbc and xray.  Use your pain medication until we get results back.  Limited choice of med treatment if gout as you have diabetes and use eliquis. This make  It risky to use prednisone or nsaids. So will likely rx colchicine when I get results back.(may do this even if uric acid not elevated)  In future no secondary sources of pain meds. Be very cautious even post surgery. Don't accept med. Have other provider call us first so we can discuss.  We have arranged for PT to come to your house to evaluate  you for the scooter.  Follow up in 10 days or as needed

## 2016-08-28 MED ORDER — COLCHICINE 0.6 MG PO TABS: 0.6000 mg | ORAL_TABLET | Freq: Two times a day (BID) | ORAL | 0 refills | Status: DC

## 2016-08-28 NOTE — Telephone Encounter (Signed)
rx colchicine sent in.

## 2016-08-31 DIAGNOSIS — I4891 Unspecified atrial fibrillation: Secondary | ICD-10-CM | POA: Diagnosis not present

## 2016-08-31 DIAGNOSIS — J449 Chronic obstructive pulmonary disease, unspecified: Secondary | ICD-10-CM | POA: Diagnosis not present

## 2016-08-31 DIAGNOSIS — E11621 Type 2 diabetes mellitus with foot ulcer: Secondary | ICD-10-CM | POA: Diagnosis not present

## 2016-08-31 DIAGNOSIS — I10 Essential (primary) hypertension: Secondary | ICD-10-CM | POA: Diagnosis not present

## 2016-08-31 DIAGNOSIS — L97513 Non-pressure chronic ulcer of other part of right foot with necrosis of muscle: Secondary | ICD-10-CM | POA: Diagnosis not present

## 2016-08-31 DIAGNOSIS — E1152 Type 2 diabetes mellitus with diabetic peripheral angiopathy with gangrene: Secondary | ICD-10-CM | POA: Diagnosis not present

## 2016-08-31 DIAGNOSIS — L97511 Non-pressure chronic ulcer of other part of right foot limited to breakdown of skin: Secondary | ICD-10-CM | POA: Diagnosis not present

## 2016-08-31 DIAGNOSIS — E785 Hyperlipidemia, unspecified: Secondary | ICD-10-CM | POA: Diagnosis not present

## 2016-08-31 DIAGNOSIS — I252 Old myocardial infarction: Secondary | ICD-10-CM | POA: Diagnosis not present

## 2016-09-08 ENCOUNTER — Telehealth: Payer: Self-pay | Admitting: Cardiology

## 2016-09-08 ENCOUNTER — Encounter: Payer: Medicare HMO | Admitting: *Deleted

## 2016-09-08 ENCOUNTER — Encounter (HOSPITAL_COMMUNITY): Payer: Medicare HMO

## 2016-09-08 ENCOUNTER — Ambulatory Visit: Payer: Medicare HMO | Admitting: Vascular Surgery

## 2016-09-08 NOTE — Telephone Encounter (Signed)
LMOVM reminding pt to send remote transmission.   

## 2016-09-10 ENCOUNTER — Telehealth: Payer: Self-pay | Admitting: Medical

## 2016-09-10 MED ORDER — AMBULATORY NON FORMULARY MEDICATION: 0 refills | Status: DC

## 2016-09-10 NOTE — Telephone Encounter (Signed)
Dr. Etter Sjogren,  Would you mind explaining process through [facility] on how to get power chair/scooter. Kristi and I have talked with them and they stated they just need rx for the chair/scooter? You had mentioned that they come to the office sometimes to make sure paperwork gets filled out correctly. But contact person Steffanie Dunn talked with did not mention anything to that effect.   I just wanted easiest way at this point as I already filled out one lengthy form and was denied.  Thanks, Architect, Percell Miller, PA-C

## 2016-09-10 NOTE — Telephone Encounter (Signed)
Pt called in to make PCP aware that [facility] still haven't came out to her home.   Please assist further

## 2016-09-10 NOTE — Telephone Encounter (Signed)
Did Dr. Etter Sjogren sign a script for power scooter or chair. I saw your note. Do you know where pt is taking the script to? L

## 2016-09-10 NOTE — Telephone Encounter (Signed)
Pt left VM stating that Alexis [name] with [employer] is going to be sending a fax with requirements for documentation needed. Pt was told she does not need PT eval. No fax received yet.

## 2016-09-10 NOTE — Telephone Encounter (Signed)
Called pt to discuss PT eval and power scooter/chair. She states that she has a [employer], dispensing company in [place] already set up. She said they requested [place] to have PT eval. Per notes pt did not get PT eval (though she states PT was with [facility]). Pt said that she's already had eval with Percell Miller for required documentation. I located cover letter/fax from [employer]. I advised pt I would call [place] and find out what information is still needed. I called and was told I needed to speak with Lenna Sciara, who was currently on the phone with Ms. Lonia Skinner. The rep will have Melissa to call me back and discuss power scooter/chair.

## 2016-09-10 NOTE — Telephone Encounter (Signed)
Opened to evaluate referral to get wheel chair. Advised Kristi to investigate what the hold up would be. Call [facility] to investigate process.

## 2016-09-10 NOTE — Telephone Encounter (Signed)
I filled out a extensive form already about one month ago. I don't know where that form is or how I can't ammend if can't find. Looked in media and I don't see copy. [facility] gave me a form to fill out and it looks different than other forms I already filled out. Does patient have copy of the form I already filled out? Can you find it in epic?

## 2016-09-10 NOTE — Telephone Encounter (Signed)
Notes below copied from the referral. Pt needs to come get an RX.  ----- Message ----- From: Ann Held, DO Sent: 09/06/2016   5:00 PM To: Doylene Canning, [place] to give rx  ----- Message ----- From: Margot Ables Sent: 08/27/2016   4:18 PM To: Ann Held, DO, *  msg from [facility] Rep: "These items are through our Retail.  I'm not sure if they are covered by insurance.  You can give the pt a script for a motorized scooter and they can bring it in to our store.  I know we have a variety to choose from.  Thank you!"

## 2016-09-10 NOTE — Telephone Encounter (Signed)
Pt called back stating she was told we need to document toe amputated, cannot walk more than 32f due pain in legs and feet (neuropathy), pt has neuropathy in hands as well which would make manual wheelchair difficult, pt is wanting a power SCOOTER (documentation needs to be ammended to remove chair).

## 2016-09-10 NOTE — Telephone Encounter (Signed)
rx written and place at front for pickup.  Left message on machine that patient can either come by to pickup or get [place] a fax number.

## 2016-09-13 ENCOUNTER — Telehealth: Payer: Self-pay | Admitting: Medical

## 2016-09-13 NOTE — Telephone Encounter (Signed)
I was unable to find form in EPIC only a cover page. Maybe Alexis [name] has it or can find it. I don't know what it looks like. They stated they were faxing information when I spoke to the pt Friday.

## 2016-09-13 NOTE — Telephone Encounter (Signed)
We usually just call [facility] and they send someone over but they may have changed the way they do it  Above is what Dr. Etter Sjogren told me regarding getting the scooter or power chair. Is there a number you could give me so I can call them.   We can't find the form I filled out and I don't want to fill out another one and get denied. So if you get me number I will call them.

## 2016-09-13 NOTE — Telephone Encounter (Signed)
We usually just call [facility] and they send someone over but they may have changed the way they do it

## 2016-09-13 NOTE — Telephone Encounter (Signed)
Notify pt that I am trying to get advise from [facility] as the prior form I filled out was denied. Does she have a copy of the form since I can't find. The one I  Am looking for is the one I filled out on 07-20-2016.   Or the company form was submitted to do they have a copy?

## 2016-09-14 NOTE — Telephone Encounter (Signed)
Informed the patient that a prescription is at the front for her to take to [facility] retail store. She will pickup at her convenience

## 2016-09-14 NOTE — Telephone Encounter (Signed)
I did tell her to pickup the prescription written by Dr. Etter Sjogren and take it a [facility] or the other store mentioned retail store to get.

## 2016-09-14 NOTE — Telephone Encounter (Signed)
[facility] ph # is [phone]. I called them 2 different times and was told the patient will need to bring RX into the retail store. Then when I called the patient she stated she had chosen to go with [employer] (see previous tel notes). When I left today for my meeting Delsa Sale had transferred the patient to Shirlean Mylar.   Shirlean Mylar, do you have additional information? It seems that the easiest solution is to have patient pickup the RX that was written by Dr. Etter Sjogren for the power scooter. Based on what [place] told me, the scooters are harder to get covered/paid for by insurance.

## 2016-09-19 ENCOUNTER — Telehealth: Payer: Self-pay | Admitting: Medical

## 2016-09-19 DIAGNOSIS — S98139A Complete traumatic amputation of one unspecified lesser toe, initial encounter: Secondary | ICD-10-CM

## 2016-09-19 DIAGNOSIS — G629 Polyneuropathy, unspecified: Secondary | ICD-10-CM

## 2016-09-19 DIAGNOSIS — Z7409 Other reduced mobility: Secondary | ICD-10-CM

## 2016-09-19 NOTE — Telephone Encounter (Signed)
Referral to PT for power wheel chair evaluation.

## 2016-09-20 NOTE — Telephone Encounter (Signed)
I have talked to [place] and patient. Insurance required a face-to-face visit with provider w/in 45 days of equipment being received. Insurance requires specific guidelines to be documented in the notes. Melissa with [employer] will fax me a copy of the guidelines and a new form. I contacted the patient is she may want to use a different company now. She may decide to wait until Oct/Nov and change from [facility] to a different insurance company. The patient refused PT eval with [facility] because they PT told her that [facility] would have to provide the scooter (she is clear she wants a motorized scooter - does not want a motoroized wheelchair). She did not want thru [facility] so she declined the eval.   Pt will call us back when she decides if she wants to proceed and which company she wants to get scooter from. We will need to contact whichever provider she chooses to send [place] a copy of the insurance guidelines and the form they need at that time. Then we can schedule an office visit. Some medical companies will come to appt with the patient but generally they do not.  I have cancelled the referral for PT eval as pt does not want to do that at this time.

## 2016-09-22 DIAGNOSIS — I10 Essential (primary) hypertension: Secondary | ICD-10-CM | POA: Diagnosis not present

## 2016-09-22 DIAGNOSIS — E1142 Type 2 diabetes mellitus with diabetic polyneuropathy: Secondary | ICD-10-CM | POA: Diagnosis not present

## 2016-09-22 DIAGNOSIS — E039 Hypothyroidism, unspecified: Secondary | ICD-10-CM | POA: Diagnosis not present

## 2016-09-22 DIAGNOSIS — Z794 Long term (current) use of insulin: Secondary | ICD-10-CM | POA: Diagnosis not present

## 2016-09-22 DIAGNOSIS — E782 Mixed hyperlipidemia: Secondary | ICD-10-CM | POA: Diagnosis not present

## 2016-09-27 ENCOUNTER — Encounter: Payer: Self-pay | Admitting: Medical

## 2016-09-29 DIAGNOSIS — H524 Presbyopia: Secondary | ICD-10-CM | POA: Diagnosis not present

## 2016-10-11 ENCOUNTER — Telehealth: Payer: Self-pay | Admitting: Medical

## 2016-10-11 ENCOUNTER — Encounter: Payer: Self-pay | Admitting: Medical

## 2016-10-11 ENCOUNTER — Emergency Department (HOSPITAL_BASED_OUTPATIENT_CLINIC_OR_DEPARTMENT_OTHER): Payer: Medicare HMO

## 2016-10-11 ENCOUNTER — Encounter (HOSPITAL_BASED_OUTPATIENT_CLINIC_OR_DEPARTMENT_OTHER): Payer: Self-pay | Admitting: Emergency Medicine

## 2016-10-11 ENCOUNTER — Emergency Department (HOSPITAL_BASED_OUTPATIENT_CLINIC_OR_DEPARTMENT_OTHER)
Admission: EM | Admit: 2016-10-11 | Discharge: 2016-10-11 | Disposition: A | Discharge status: Home / Self Care | Payer: Medicare HMO | Attending: Emergency Medicine | Admitting: Emergency Medicine

## 2016-10-11 DIAGNOSIS — J45909 Unspecified asthma, uncomplicated: Secondary | ICD-10-CM | POA: Insufficient documentation

## 2016-10-11 DIAGNOSIS — S0990XA Unspecified injury of head, initial encounter: Secondary | ICD-10-CM | POA: Diagnosis not present

## 2016-10-11 DIAGNOSIS — I110 Hypertensive heart disease with heart failure: Secondary | ICD-10-CM | POA: Diagnosis not present

## 2016-10-11 DIAGNOSIS — E039 Hypothyroidism, unspecified: Secondary | ICD-10-CM | POA: Insufficient documentation

## 2016-10-11 DIAGNOSIS — Z87891 Personal history of nicotine dependence: Secondary | ICD-10-CM | POA: Diagnosis not present

## 2016-10-11 DIAGNOSIS — S0083XA Contusion of other part of head, initial encounter: Secondary | ICD-10-CM | POA: Insufficient documentation

## 2016-10-11 DIAGNOSIS — S0081XA Abrasion of other part of head, initial encounter: Secondary | ICD-10-CM | POA: Diagnosis not present

## 2016-10-11 DIAGNOSIS — I509 Heart failure, unspecified: Secondary | ICD-10-CM | POA: Insufficient documentation

## 2016-10-11 DIAGNOSIS — Z794 Long term (current) use of insulin: Secondary | ICD-10-CM | POA: Diagnosis not present

## 2016-10-11 DIAGNOSIS — Z7901 Long term (current) use of anticoagulants: Secondary | ICD-10-CM | POA: Diagnosis not present

## 2016-10-11 DIAGNOSIS — Y939 Activity, unspecified: Secondary | ICD-10-CM | POA: Insufficient documentation

## 2016-10-11 DIAGNOSIS — Y929 Unspecified place or not applicable: Secondary | ICD-10-CM | POA: Diagnosis not present

## 2016-10-11 DIAGNOSIS — J449 Chronic obstructive pulmonary disease, unspecified: Secondary | ICD-10-CM | POA: Diagnosis not present

## 2016-10-11 DIAGNOSIS — E119 Type 2 diabetes mellitus without complications: Secondary | ICD-10-CM | POA: Insufficient documentation

## 2016-10-11 DIAGNOSIS — W06XXXA Fall from bed, initial encounter: Secondary | ICD-10-CM | POA: Insufficient documentation

## 2016-10-11 DIAGNOSIS — Y999 Unspecified external cause status: Secondary | ICD-10-CM | POA: Insufficient documentation

## 2016-10-11 DIAGNOSIS — Z9181 History of falling: Secondary | ICD-10-CM

## 2016-10-11 NOTE — Telephone Encounter (Signed)
Call [facility] and see how pt can get bed railing. She has 3 fall out of bed. Recent facial laceration on last fall rolling out of bed. I tried to find order in epic but could not find. Maybe ashlee has ordered in epic before. Or I could write order on regular script pad. Let me know what [facility] or Ahslee says

## 2016-10-11 NOTE — ED Provider Notes (Signed)
[facility] DEPT [facility] Provider Note   CSN: [id number] Arrival date & time: 10/11/16  2979     History   Chief Complaint Chief Complaint  Patient presents with  . Fall  . Laceration    HPI Alexis [name] is a 66 y.o. female.  HPI Patient reports that she was having a bad dream which caused her to fall out of her bed striking her head. She reports she immediately got up off the floor. She reports she has a bad headache. Patient is anticoagulated on Eliquis. No confusion no nausea no vomiting. She reports it has been feeling better with ice pack. Past Medical History:  Diagnosis Date  . Allergy   . Anxiety   . Arthritis   . Asthma   . Atrial fibrillation ([place])   . CHF (congestive heart failure) ([place])   . COPD (chronic obstructive pulmonary disease) ([place])   . Diabetes mellitus without complication ([place])   . Hyperlipidemia   . Hypertension   . Myocardial infarction ([place])    several  . Pacemaker    CRT-P with RV lead and His Bundle lead ( high threshold)   . Peripheral neuropathy   . Pneumonia   . PONV (postoperative nausea and vomiting)    unsure of exactly what happened at [facility] in 2010 (knee surgery) that led to crital care  . Thyroid disease     Patient Active Problem List   Diagnosis Date Noted  . Osteomyelitis ([place]) 07/30/2016  . Gangrene of foot ([place]) 04/27/2016  . Persistent atrial fibrillation ([place])   . Thrombocytopenia ([place])   . Vomiting and diarrhea 04/05/2016  . Hyperlipidemia 11/18/2015  . Gout 08/05/2014  . Cough 08/05/2014  . Diabetes ([place]) 07/12/2014  . Allergic rhinitis 07/12/2014  . Osteoarthritis 07/12/2014  . Asthma 07/12/2014  . CHF (congestive heart failure) ([place]) 07/12/2014  . CAD (coronary artery disease) 07/12/2014  . Atrial fibrillation ([place]) 07/12/2014  . HTN (hypertension) 07/12/2014  . Hypothyroidism 07/12/2014  . History of substance abuse 07/12/2014  . Peripheral vascular disease ([place]) 04/02/2013  .  Discoloration of skin 04/02/2013    Past Surgical History:  Procedure Laterality Date  . ABDOMINAL AORTOGRAM W/LOWER EXTREMITY N/A 04/29/2016   Procedure: Abdominal Aortogram w/Lower Extremity;  Surgeon: Waynetta Sandy, MD;  Location: [facility];  Service: Cardiovascular;  Laterality: N/A;  . ABDOMINAL HYSTERECTOMY    . AMPUTATION Right 07/30/2016   Procedure: AMPUTATION RIGHT SECOND TOE;  Surgeon: Angelia Mould, MD;  Location: [facility];  Service: Vascular;  Laterality: Right;  . CARDIAC CATHETERIZATION     2005 at [facility]  . PERIPHERAL VASCULAR ATHERECTOMY Right 04/29/2016   Procedure: Peripheral Vascular Atherectomy-Right Popliteal;  Surgeon: Waynetta Sandy, MD;  Location: [facility];  Service: Cardiovascular;  Laterality: Right;  . PERIPHERAL VASCULAR INTERVENTION Right 04/29/2016   Procedure: Peripheral Vascular Intervention-Right Popliteal;  Surgeon: Waynetta Sandy, MD;  Location: [facility];  Service: Cardiovascular;  Laterality: Right;  POPLITEAL PTA  . RADIOFREQUENCY ABLATION    . TOTAL KNEE ARTHROPLASTY    . TUBAL LIGATION      OB History    No data available       Home Medications    Prior to Admission medications   Medication Sig Start Date End Date Taking? Authorizing Provider  acetaminophen (TYLENOL) 500 MG tablet Take 1,000 mg by mouth daily as needed for moderate pain.    [provider]  albuterol (  PROVENTIL HFA;VENTOLIN HFA) 108 (90 Base) MCG/ACT inhaler Inhale 2 puffs into the lungs every 6 (six) hours as needed for wheezing or shortness of breath. 02/10/16   Saguier, Percell [place], PA-C  albuterol (PROVENTIL) (2.5 MG/3ML) 0.083% nebulizer solution Take 3 mLs (2.5 mg total) by nebulization every 6 (six) hours as needed for wheezing or shortness of breath. 02/10/16   Saguier, Percell [place], PA-C  allopurinol (ZYLOPRIM) 100 MG tablet Take ONE (1) tablet by mouth once daily 09/18/15   Saguier, Percell [place], PA-C    AMBULATORY NON FORMULARY MEDICATION Motorized scooter.  Diagnosis: Osteoarthritis M19.90 09/10/16   Carollee Herter, Alferd Apa, DO  apixaban (ELIQUIS) 5 MG TABS tablet Take 1 tablet (5 mg total) by mouth 2 (two) times daily. 05/07/16   Jettie Booze, MD  Blood Glucose Monitoring Suppl (GLUCOCOM BLOOD GLUCOSE MONITOR) DEVI Use to check blood sugars 6 times daily E11.65 05/01/15   [provider]  colchicine 0.6 MG tablet Take 1 tablet (0.6 mg total) by mouth 2 (two) times daily. 08/28/16   Saguier, Percell [place], PA-C  DULoxetine (CYMBALTA) 30 MG capsule Take 1 capsule (30 mg total) by mouth at bedtime. 08/06/16   Saguier, Percell [place], PA-C  fluticasone (FLONASE) 50 MCG/ACT nasal spray Place 2 sprays into both nostrils at bedtime. 07/31/16   Alvia Grove, PA-C  fluticasone (FLOVENT HFA) 110 MCG/ACT inhaler Inhale 2 puffs into the lungs 2 (two) times daily as needed (bronchitis). 07/31/16   Alvia Grove, PA-C  Insulin Disposable Pump (V-GO 40) KIT Inject into the skin See admin instructions. Use with Novolog - 6 pumps before each meal 11/21/15   [provider]  levothyroxine (SYNTHROID, LEVOTHROID) 137 MCG tablet Take 137 mcg by mouth daily before breakfast.    [provider]  metoprolol succinate (TOPROL-XL) 25 MG 24 hr tablet Take 25 mg by mouth daily.  02/09/16   [provider]  NOVOLOG 100 UNIT/ML injection 100 units per day in VGO unknown bolus 06/23/16   [provider]  Prenatal Vit-Fe Fumarate-FA (MULTIVITAMIN-PRENATAL) 27-0.8 MG TABS tablet Take 1 tablet by mouth daily. Takes for nail and hair growth     [provider]  ramipril (ALTACE) 2.5 MG capsule Take 2.5 mg by mouth at bedtime.    [provider]  torsemide (DEMADEX) 10 MG tablet Take 1 tablet (10 mg total) by mouth daily. 08/06/16   Saguier, Percell [place], PA-C    Family History Family History  Problem Relation Age of Onset  . Diabetes Mother   . Hyperlipidemia Mother   . Diabetes  Father   . Hyperlipidemia Father   . Heart disease Father        before age 39  . Heart attack Father   . Diabetes Brother   . Hyperlipidemia Brother   . Heart attack Brother     Social History Social History  Substance Use Topics  . Smoking status: Former Smoker    Quit date: 03/01/1998  . Smokeless tobacco: Never Used  . 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     Allergies   Indomethacin; Pregabalin; Sulfa antibiotics; and Morphine and related   Review of Systems Review of Systems 10 Systems reviewed and are negative for acute change except as noted in the HPI.   Physical Exam Updated Vital Signs BP (!) 134/95 (BP Location: Right Arm)   Pulse 83   Temp 97.9 F (36.6 C) (Oral)   Resp 18  Ht 5' 2"  (1.575 m)   Wt 102.1 kg (225 lb)   SpO2 97%   BMI 41.15 kg/m   Physical Exam  Constitutional: She is oriented to person, place, and time. She appears well-developed and well-nourished. No distress.  HENT:  6 cm contusion to central forehead. 3 mm of deep abrasion.  Eyes: Pupils are equal, round, and reactive to light. EOM are normal.  Neck: Neck supple.  No C-spine tenderness to palpation.  Cardiovascular: Normal rate and regular rhythm.   Pulmonary/Chest: Effort normal and breath sounds normal.  Abdominal: Soft. There is no tenderness.  Musculoskeletal: Normal range of motion.  Neurological: She is alert and oriented to person, place, and time. No cranial nerve deficit. She exhibits normal muscle tone. Coordination normal.  Skin: Skin is warm and dry.  Psychiatric: She has a normal mood and affect.     ED Treatments / Results  Labs (all labs ordered are listed, but only abnormal results are displayed) Labs Reviewed - No data to display  EKG  EKG Interpretation None       Radiology Ct Head Wo Contrast  Result Date: 10/11/2016 CLINICAL DATA:  Fall from bed with head injury. Blood thinner use. Initial  encounter. EXAM: CT HEAD WITHOUT CONTRAST TECHNIQUE: Contiguous axial images were obtained from the base of the skull through the vertex without intravenous contrast. COMPARISON:  None available FINDINGS: Brain: No evidence of acute infarction, hemorrhage, hydrocephalus, extra-axial collection or mass lesion/mass effect. Patchy low-density in the cerebral white matter attributed to chronic small vessel ischemia given patient's risk factors. Remote appearing lacunar infarct in the upper right putamen. Vascular: Atherosclerosis. Skull: Mild forehead swelling where there is reportedly a laceration. Negative for fracture. Small scattered scalp calcifications. No suspected foreign body. Sinuses/Orbits: Chronic right maxillary sinusitis with moderate mucosal thickening but patent infundibulum. Leftward nasal septal spurring. No acute finding. IMPRESSION: 1. No evidence of intracranial injury or fracture. 2. Chronic small vessel ischemia. 3. Moderate chronic right maxillary sinusitis. Electronically Signed   By: Monte Fantasia M.D.   On: 10/11/2016 08:25    Procedures Procedures (including critical care time)  Medications Ordered in ED Medications - No data to display   Initial Impression / Assessment and Plan / ED Course  I have reviewed the triage vital signs and the nursing notes.  Pertinent labs & imaging results that were available during my care of the patient were reviewed by me and considered in my medical decision making (see chart for details).     Final Clinical Impressions(s) / ED Diagnoses   Final diagnoses:  Injury of head, initial encounter  Anticoagulated by anticoagulation treatment   Patient has had fall with head injury. Patient is anticoagulated with Eliquis. CT head shows no intracranial bleeding. Patient has abrasion to forehead and contusion but no laceration for suture repair. Wound care instruction and head injury instruction provided. New Prescriptions New Prescriptions     No medications on file     Charlesetta Shanks, MD 10/11/16 [phone]

## 2016-10-11 NOTE — ED Triage Notes (Addendum)
Pt was having a nightmare, fell off the side of the bed.  Hit her head on nightstand.  No LOC.  Pt has small laceration/avulsion to her forehead.  Noted bruising.  Pt is on blood thinners.

## 2016-10-12 NOTE — Addendum Note (Signed)
Addended by: Hinton Dyer on: 10/12/2016 11:09 AM   Modules accepted: Orders

## 2016-10-12 NOTE — Telephone Encounter (Signed)
Faxed order [facility].

## 2016-10-18 ENCOUNTER — Encounter: Payer: Self-pay | Admitting: Medical

## 2016-10-27 ENCOUNTER — Encounter: Payer: Self-pay | Admitting: Vascular Surgery

## 2016-10-31 ENCOUNTER — Encounter: Payer: Self-pay | Admitting: Medical

## 2016-11-02 MED ORDER — METOPROLOL SUCCINATE ER 25 MG PO TB24: 25.0000 mg | ORAL_TABLET | Freq: Every day | ORAL | 1 refills | Status: DC

## 2016-11-03 ENCOUNTER — Ambulatory Visit: Payer: Medicare HMO | Admitting: Medical

## 2016-11-03 ENCOUNTER — Other Ambulatory Visit: Payer: Self-pay | Admitting: Family

## 2016-11-03 ENCOUNTER — Ambulatory Visit (HOSPITAL_COMMUNITY)
Admission: RE | Admit: 2016-11-03 | Discharge: 2016-11-03 | Disposition: A | Discharge status: Home / Self Care | Payer: Medicare HMO | Source: Ambulatory Visit | Attending: Vascular Surgery | Admitting: Vascular Surgery

## 2016-11-03 ENCOUNTER — Encounter: Payer: Self-pay | Admitting: Vascular Surgery

## 2016-11-03 ENCOUNTER — Ambulatory Visit (INDEPENDENT_AMBULATORY_CARE_PROVIDER_SITE_OTHER): Payer: Medicare HMO | Admitting: Vascular Surgery

## 2016-11-03 VITALS — BP 118/64 | HR 81 | Temp 97.2°F | Ht 62.0 in | Wt 225.7 lb

## 2016-11-03 DIAGNOSIS — S98131A Complete traumatic amputation of one right lesser toe, initial encounter: Secondary | ICD-10-CM

## 2016-11-03 DIAGNOSIS — Z8739 Personal history of other diseases of the musculoskeletal system and connective tissue: Secondary | ICD-10-CM

## 2016-11-03 DIAGNOSIS — R0989 Other specified symptoms and signs involving the circulatory and respiratory systems: Secondary | ICD-10-CM | POA: Diagnosis not present

## 2016-11-03 DIAGNOSIS — Z89421 Acquired absence of other right toe(s): Secondary | ICD-10-CM | POA: Insufficient documentation

## 2016-11-03 DIAGNOSIS — E1151 Type 2 diabetes mellitus with diabetic peripheral angiopathy without gangrene: Secondary | ICD-10-CM | POA: Diagnosis not present

## 2016-11-03 DIAGNOSIS — I779 Disorder of arteries and arterioles, unspecified: Secondary | ICD-10-CM

## 2016-11-03 NOTE — Progress Notes (Signed)
Patient name: Alexis [name] MRN: [id number] DOB: [birth year] Sex: female  REASON FOR VISIT:    Follow up of peripheral vascular disease.  HPI:   Alexis [name] is a pleasant 66 y.o. female who presented with osteomyelitis of her right second toe. She underwent endovascular revascularization of the right lower extremity by Dr. Donzetta Matters with an excellent result. She has single-vessel runoff on the right via the peroneal artery. She had osteomyelitis of the right second toe and I recommended right second toe amputation. This was performed on 07/30/2016.  Since I saw her last, she is doing well. She has no significant claudication symptoms currently. She denies rest pain although she does have significant neuropathy. She has found a "supplement" that helps her with her neuropathy at night.   Past Medical History:  Diagnosis Date  . Allergy   . Anxiety   . Arthritis   . Asthma   . Atrial fibrillation ([place])   . CHF (congestive heart failure) ([place])   . COPD (chronic obstructive pulmonary disease) ([place])   . Diabetes mellitus without complication ([place])   . Hyperlipidemia   . Hypertension   . Myocardial infarction ([place])    several  . Pacemaker    CRT-P with RV lead and His Bundle lead ( high threshold)   . Peripheral neuropathy   . Pneumonia   . PONV (postoperative nausea and vomiting)    unsure of exactly what happened at [facility] in 2010 (knee surgery) that led to crital care  . Thyroid disease     Family History  Problem Relation Age of Onset  . Diabetes Mother   . Hyperlipidemia Mother   . Diabetes Father   . Hyperlipidemia Father   . Heart disease Father        before age 51  . Heart attack Father   . Diabetes Brother   . Hyperlipidemia Brother   . Heart attack Brother     SOCIAL HISTORY: Social History  Substance Use Topics  . Smoking status: Former Smoker    Quit date: 03/01/1998  . Smokeless tobacco: Never Used  . Alcohol use No     Comment: Previous  hx: recovering alcoholic.  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    Allergies  Allergen Reactions  . Indomethacin Other (See Comments)     Renal Insufficiency  . Pregabalin Other (See Comments)    DIZZINESS   . Sulfa Antibiotics Hives  . Morphine And Related Nausea And Vomiting    Current Outpatient Prescriptions  Medication Sig Dispense Refill  . acetaminophen (TYLENOL) 500 MG tablet Take 1,000 mg by mouth daily as needed for moderate pain.    Marland Kitchen albuterol (PROVENTIL HFA;VENTOLIN HFA) 108 (90 Base) MCG/ACT inhaler Inhale 2 puffs into the lungs every 6 (six) hours as needed for wheezing or shortness of breath. 1 Inhaler 2  . albuterol (PROVENTIL) (2.5 MG/3ML) 0.083% nebulizer solution Take 3 mLs (2.5 mg total) by nebulization every 6 (six) hours as needed for wheezing or shortness of breath. 150 mL 1  . allopurinol (ZYLOPRIM) 100 MG tablet Take ONE (1) tablet by mouth once daily 90 tablet 3  . AMBULATORY NON FORMULARY MEDICATION Motorized scooter.  Diagnosis: Osteoarthritis M19.90 1 each 0  . apixaban (ELIQUIS) 5 MG TABS tablet Take 1 tablet (5 mg total) by mouth 2 (two) times daily. 180 tablet 1  . Blood Glucose Monitoring Suppl (GLUCOCOM BLOOD GLUCOSE MONITOR) DEVI Use to check blood sugars 6 times daily E11.65    .  colchicine 0.6 MG tablet Take 1 tablet (0.6 mg total) by mouth 2 (two) times daily. 60 tablet 0  . DULoxetine (CYMBALTA) 30 MG capsule Take 1 capsule (30 mg total) by mouth at bedtime. 90 capsule 0  . fluticasone (FLONASE) 50 MCG/ACT nasal spray Place 2 sprays into both nostrils at bedtime.    . fluticasone (FLOVENT HFA) 110 MCG/ACT inhaler Inhale 2 puffs into the lungs 2 (two) times daily as needed (bronchitis).    . Insulin Disposable Pump (V-GO 40) KIT Inject into the skin See admin instructions. Use with Novolog - 6 pumps before each meal    . levothyroxine (SYNTHROID, LEVOTHROID) 137 MCG tablet Take 137 mcg by mouth daily before breakfast.    . metoprolol succinate (TOPROL-XL) 25 MG 24  hr tablet Take 1 tablet (25 mg total) by mouth daily. 90 tablet 1  . NOVOLOG 100 UNIT/ML injection 100 units per day in VGO unknown bolus    . OVER THE COUNTER MEDICATION Take 0.5 tablets by mouth at bedtime. THC Candy from [place]    . Prenatal Vit-Fe Fumarate-FA (MULTIVITAMIN-PRENATAL) 27-0.8 MG TABS tablet Take 1 tablet by mouth daily. Takes for nail and hair growth     . ramipril (ALTACE) 2.5 MG capsule Take 2.5 mg by mouth at bedtime.    . torsemide (DEMADEX) 10 MG tablet Take 1 tablet (10 mg total) by mouth daily. 90 tablet 0   No current facility-administered medications for this visit.     REVIEW OF SYSTEMS:  [X]  denotes positive finding, [ ]  denotes negative finding Cardiac  Comments:  Chest pain or chest pressure:    Shortness of breath upon exertion:    Short of breath when lying flat:    Irregular heart rhythm:        Vascular    Pain in calf, thigh, or hip brought on by ambulation:    Pain in feet at night that wakes you up from your sleep:  X Neuropathy   Blood clot in your veins:    Leg swelling:         Pulmonary    Oxygen at home:    Productive cough:     Wheezing:         Neurologic    Sudden weakness in arms or legs:     Sudden numbness in arms or legs:     Sudden onset of difficulty speaking or slurred speech:    Temporary loss of vision in one eye:     Problems with dizziness:         Gastrointestinal    Blood in stool:     Vomited blood:         Genitourinary    Burning when urinating:     Blood in urine:        Psychiatric    Major depression:         Hematologic    Bleeding problems:    Problems with blood clotting too easily:        Skin    Rashes or ulcers:        Constitutional    Fever or chills:     PHYSICAL EXAM:   Vitals:   11/03/16 1443  BP: 118/64  Pulse: 81  Temp: (!) 97.2 F (36.2 C)  TempSrc: Oral  SpO2: 96%  Weight: 225 lb 11.2 oz (102.4 kg)  Height: 5' 2"  (1.575 m)   GENERAL: The patient is a well-nourished  female, in no acute distress.  The vital signs are documented above. CARDIAC: There is a regular rate and rhythm.  VASCULAR: I do not detect carotid bruits. Because of her weight, I cannot palpate femoral pulses. I cannot palpate pedal pulses. PULMONARY: There is good air exchange bilaterally without wheezing or rales. ABDOMEN: Soft and non-tender with normal pitched bowel sounds.  MUSCULOSKELETAL: Her right second toe amputation site is healed. NEUROLOGIC: No focal weakness or paresthesias are detected. SKIN: There are no ulcers or rashes noted. PSYCHIATRIC: The patient has a normal affect.  DATA:    Arterial Doppler study: I have independently interpreted her arterial Doppler study today.  On the right side there is a biphasic posterior tibial signal and a monophasic dorsalis pedis signal. ABI is 100%. Toe pressure on the right is 94 mmHg.  On the left side as a biphasic dorsalis pedis signal and a biphasic posterior tibial signal. ABI on the left is 97%. Toe pressure on the left is 76 mmHg.  Lower extremity duplex: I have independently interpreted her right lower extremity duplex scan. The patient has biphasic sick signals from the common femoral artery through the posterior tibial artery and peroneal artery. There is a monophasic anterior tibial signal.  MEDICAL ISSUES:   PERIPHERAL VASCULAR DISEASE: The patient has had a good result from her endovascular revascularization that was performed by Dr. Donzetta Matters. Her toe amputation site has healed. I have encouraged her to stay as active as possible. Fortunately she is not a smoker. We have discussed the importance of nutrition. I've ordered follow up ABIs in 6 months and I'll see her back at that time. She knows to call sooner if she has problems.  [name] [facility] [phone]

## 2016-11-04 NOTE — Addendum Note (Signed)
Addended by: Lianne Cure A on: 11/04/2016 10:25 AM   Modules accepted: Orders

## 2016-11-10 ENCOUNTER — Telehealth: Payer: Self-pay | Admitting: Medical

## 2016-11-10 NOTE — Telephone Encounter (Addendum)
° °  Please reference 10/11/16 telephone note

## 2016-11-10 NOTE — Telephone Encounter (Signed)
Pt called checking status bed railing. Informed her per previous note order [facility]. Pt will check there and call back if they do not have it.

## 2016-11-10 NOTE — Telephone Encounter (Signed)
Relation to YS:HUOH Call back [place]:  Reason for call:  [facility] stating orders never requesting orders re fax to [facility] fax #  [phone]

## 2016-11-11 ENCOUNTER — Other Ambulatory Visit: Payer: Self-pay

## 2016-11-11 ENCOUNTER — Telehealth: Payer: Self-pay | Admitting: Family Medicine

## 2016-11-11 MED ORDER — DULOXETINE HCL 30 MG PO CPEP: 30.0000 mg | ORAL_CAPSULE | Freq: Every day | ORAL | 0 refills | Status: DC

## 2016-11-11 NOTE — Telephone Encounter (Signed)
Called pharmacy states they needed new Rx. Caled # 30 in to [name] and reoredered regular Rx from [place]. Left messate on patient s answering machine.

## 2016-11-11 NOTE — Telephone Encounter (Signed)
Pt called states cymbalta out since Monday. [name] says they have faxed it here with no response. Pt ph [phone]. Pt states very emotional since out.

## 2016-11-24 ENCOUNTER — Ambulatory Visit (HOSPITAL_BASED_OUTPATIENT_CLINIC_OR_DEPARTMENT_OTHER)
Admission: RE | Admit: 2016-11-24 | Discharge: 2016-11-24 | Disposition: A | Discharge status: Home / Self Care | Payer: Medicare HMO | Source: Ambulatory Visit | Attending: Medical | Admitting: Medical

## 2016-11-24 ENCOUNTER — Encounter: Payer: Self-pay | Admitting: Medical

## 2016-11-24 ENCOUNTER — Ambulatory Visit (INDEPENDENT_AMBULATORY_CARE_PROVIDER_SITE_OTHER): Payer: Medicare HMO | Admitting: Medical

## 2016-11-24 ENCOUNTER — Telehealth: Payer: Self-pay | Admitting: Medical

## 2016-11-24 VITALS — BP 130/80 | HR 67 | Temp 98.2°F | Resp 16

## 2016-11-24 DIAGNOSIS — M79672 Pain in left foot: Secondary | ICD-10-CM | POA: Diagnosis not present

## 2016-11-24 DIAGNOSIS — S99922A Unspecified injury of left foot, initial encounter: Secondary | ICD-10-CM | POA: Diagnosis not present

## 2016-11-24 DIAGNOSIS — M25562 Pain in left knee: Secondary | ICD-10-CM | POA: Diagnosis not present

## 2016-11-24 DIAGNOSIS — M25572 Pain in left ankle and joints of left foot: Secondary | ICD-10-CM

## 2016-11-24 DIAGNOSIS — M2550 Pain in unspecified joint: Secondary | ICD-10-CM

## 2016-11-24 DIAGNOSIS — M7989 Other specified soft tissue disorders: Secondary | ICD-10-CM | POA: Insufficient documentation

## 2016-11-24 DIAGNOSIS — M7732 Calcaneal spur, left foot: Secondary | ICD-10-CM | POA: Diagnosis not present

## 2016-11-24 DIAGNOSIS — S99912A Unspecified injury of left ankle, initial encounter: Secondary | ICD-10-CM | POA: Diagnosis not present

## 2016-11-24 LAB — SEDIMENTATION RATE: SED RATE: 84 mm/h — AB (ref 0–30)

## 2016-11-24 LAB — C-REACTIVE PROTEIN: CRP: 2.6 mg/dL (ref 0.5–20.0)

## 2016-11-24 LAB — URIC ACID: URIC ACID, SERUM: 7.5 mg/dL — AB (ref 2.4–7.0)

## 2016-11-24 MED ORDER — OXYCODONE-ACETAMINOPHEN 5-325 MG PO TABS: 1.0000 | ORAL_TABLET | Freq: Four times a day (QID) | ORAL | 0 refills | Status: DC | PRN

## 2016-11-24 NOTE — Patient Instructions (Addendum)
For your recent left ankle and foot pain,  I will get x-rays of both areas. I think injury to the bone/contusion(versus fracture) is more likely than gout. But will do both the x-rays and labs.  Based on your history decreased kidney function, diabetes and side effects to various pain medications choice of med for pain limited. I will prescribe low number of Percocet to help you deal with the pain. If uric acid is elevated then will need to adjust treatment and concentrate on lower uric acid.  Arthritisi panel today including uric acid level.  Follow-up in 10 days or as needed.

## 2016-11-24 NOTE — Progress Notes (Signed)
Subjective:    Patient ID: Alexis [name], female    DOB: [birth year], 66 y.o.   MRN: [id number]  HPI  Pt in for follow up.  Pt states has [employer] and states that they told her they would get her a motorized wheel chair. She states insurance will pay 80% and she will pay 20%. Pt continue to have difficulty ambulating due to neuropathy and prior rt lower ext surgery. She states in future will need rx for motroized wheel chair or scooter. Prior attempts to get scooter with old insurance failed.  Pt states left foot pain this month she slammed ankle and foot in car door. Pt states accident occurred on Sept 13, 2018.  Next day she had pain. Pt questions if maybe fracture or if gout flare. Pt is on allopurinol. On Friday she restarted colchicine. But pain still persists.    Also she reminds me of various joint pain hip, knees, and some wrist pain.  In past for pain she needed oxycodone for rt foot surgery. But has not been on this for  12weeks per her report. Prior tramadol did not help her for pain. Hydrocodone caused sided effect. She was very hyper with hydrocdone and did not help. Pt is diabetic and hx of low gfr.  Review of Systems  Constitutional: Negative for chills, fatigue and fever.  Respiratory: Negative for cough, chest tightness, shortness of breath and wheezing.   Cardiovascular: Negative for chest pain and palpitations.  Gastrointestinal: Negative for abdominal pain and anal bleeding.  Musculoskeletal: Positive for arthralgias.       See hpi.  Skin: Negative for rash.  Neurological: Negative for dizziness, syncope, weakness, numbness and headaches.  Hematological: Negative for adenopathy. Does not bruise/bleed easily.  Psychiatric/Behavioral: Negative for behavioral problems and confusion.    Past Medical History:  Diagnosis Date  . Allergy   . Anxiety   . Arthritis   . Asthma   . Atrial fibrillation ([place])   . CHF (congestive heart failure) ([place])   . COPD  (chronic obstructive pulmonary disease) ([place])   . Diabetes mellitus without complication ([place])   . Hyperlipidemia   . Hypertension   . Myocardial infarction ([place])    several  . Pacemaker    CRT-P with RV lead and His Bundle lead ( high threshold)   . Peripheral neuropathy   . Pneumonia   . PONV (postoperative nausea and vomiting)    unsure of exactly what happened at [facility] in 2010 (knee surgery) that led to crital care  . Thyroid disease      Social History   Social History  . Marital status: Married    Spouse name: N/A  . Number of children: N/A  . Years of education: N/A   Occupational History  . retired    Social History Main Topics  . Smoking status: Former Smoker    Quit date: 03/01/1998  . Smokeless tobacco: Never Used  . 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  . Drug use: Yes    Types: Marijuana     Comment: remote use  . Sexual activity: No   Other Topics Concern  . Not on file   Social History Narrative  . No narrative on file    Past Surgical History:  Procedure Laterality Date  . ABDOMINAL AORTOGRAM W/LOWER EXTREMITY N/A 04/29/2016   Procedure: Abdominal Aortogram w/Lower Extremity;  Surgeon: Waynetta Sandy, MD;  Location: [place] [facility];  Service: Cardiovascular;  Laterality: N/A;  . ABDOMINAL HYSTERECTOMY    . AMPUTATION Right 07/30/2016   Procedure: AMPUTATION RIGHT SECOND TOE;  Surgeon: Angelia Mould, MD;  Location: [place];  Service: Vascular;  Laterality: Right;  . CARDIAC CATHETERIZATION     2005 at [facility]  . PERIPHERAL VASCULAR ATHERECTOMY Right 04/29/2016   Procedure: Peripheral Vascular Atherectomy-Right Popliteal;  Surgeon: Waynetta Sandy, MD;  Location: [place] [facility];  Service: Cardiovascular;  Laterality: Right;  . PERIPHERAL VASCULAR INTERVENTION Right 04/29/2016   Procedure: Peripheral Vascular Intervention-Right Popliteal;  Surgeon:  Waynetta Sandy, MD;  Location: [place] [facility];  Service: Cardiovascular;  Laterality: Right;  POPLITEAL PTA  . RADIOFREQUENCY ABLATION    . TOTAL KNEE ARTHROPLASTY    . TUBAL LIGATION      Family History  Problem Relation Age of Onset  . Diabetes Mother   . Hyperlipidemia Mother   . Diabetes Father   . Hyperlipidemia Father   . Heart disease Father        before age 80  . Heart attack Father   . Diabetes Brother   . Hyperlipidemia Brother   . Heart attack Brother     Allergies  Allergen Reactions  . Indomethacin Other (See Comments)     Renal Insufficiency  . Pregabalin Other (See Comments)    DIZZINESS   . Sulfa Antibiotics Hives  . Morphine And Related Nausea And Vomiting    Current Outpatient Prescriptions on File Prior to Visit  Medication Sig Dispense Refill  . acetaminophen (TYLENOL) 500 MG tablet Take 1,000 mg by mouth daily as needed for moderate pain.    Marland Kitchen albuterol (PROVENTIL HFA;VENTOLIN HFA) 108 (90 Base) MCG/ACT inhaler Inhale 2 puffs into the lungs every 6 (six) hours as needed for wheezing or shortness of breath. 1 Inhaler 2  . albuterol (PROVENTIL) (2.5 MG/3ML) 0.083% nebulizer solution Take 3 mLs (2.5 mg total) by nebulization every 6 (six) hours as needed for wheezing or shortness of breath. 150 mL 1  . allopurinol (ZYLOPRIM) 100 MG tablet Take ONE (1) tablet by mouth once daily 90 tablet 3  . AMBULATORY NON FORMULARY MEDICATION Motorized scooter.  Diagnosis: Osteoarthritis M19.90 1 each 0  . apixaban (ELIQUIS) 5 MG TABS tablet Take 1 tablet (5 mg total) by mouth 2 (two) times daily. 180 tablet 1  . Blood Glucose Monitoring Suppl (GLUCOCOM BLOOD GLUCOSE MONITOR) DEVI Use to check blood sugars 6 times daily E11.65    . colchicine 0.6 MG tablet Take 1 tablet (0.6 mg total) by mouth 2 (two) times daily. 60 tablet 0  . DULoxetine (CYMBALTA) 30 MG capsule Take 1 capsule (30 mg total) by mouth at bedtime. 90 capsule 0  . fluticasone (FLONASE) 50  MCG/ACT nasal spray Place 2 sprays into both nostrils at bedtime.    . fluticasone (FLOVENT HFA) 110 MCG/ACT inhaler Inhale 2 puffs into the lungs 2 (two) times daily as needed (bronchitis).    . Insulin Disposable Pump (V-GO 40) KIT Inject into the skin See admin instructions. Use with Novolog - 6 pumps before each meal    . levothyroxine (SYNTHROID, LEVOTHROID) 137 MCG tablet Take 137 mcg by mouth daily before breakfast.    . metoprolol succinate (TOPROL-XL) 25 MG 24 hr tablet Take 1 tablet (25 mg total) by mouth daily. 90 tablet 1  . NOVOLOG 100 UNIT/ML injection 100 units per day in VGO unknown bolus    .  OVER THE COUNTER MEDICATION Take 0.5 tablets by mouth at bedtime. THC Candy from [place]    . Prenatal Vit-Fe Fumarate-FA (MULTIVITAMIN-PRENATAL) 27-0.8 MG TABS tablet Take 1 tablet by mouth daily. Takes for nail and hair growth     . ramipril (ALTACE) 2.5 MG capsule Take 2.5 mg by mouth at bedtime.    . torsemide (DEMADEX) 10 MG tablet Take 1 tablet (10 mg total) by mouth daily. 90 tablet 0   No current facility-administered medications on file prior to visit.     BP 130/80   Pulse 67   Temp 98.2 F (36.8 C) (Oral)   Resp 16   SpO2 99%       Objective:   Physical Exam  General- No acute distress. Pleasant patient. Neck- Full range of motion, no jvd Lungs- Clear, even and unlabored. Heart- regular rate and rhythm. Neurologic- CNII- XII grossly intact.  Left lower ext- no calf pain on palpation. No swelling. Negative homans signs. Left ankle- very faint direct tenderness to palpation.  Left foot- mild redness to distal foot above toes. Toes mild bruised appearance. Good capillary refill.      Assessment & Plan:  For your recent left ankle and foot pain,  I will get x-rays of both areas. I think injury to the bone/contusion(versus fracture) is more likely than gout. But will do both the x-rays and labs.  Based on your history decreased kidney function, diabetes and side  effects to various pain medications choice of med for pain limited. I will prescribe low number of Percocet to help you deal with the pain. If uric acid is elevated then will need to adjust treatment and concentrate on lower uric acid.  Arthritisi panel today including uric acid level.  Follow-up in 10 days or as needed.  Yocelin Vanlue, Percell Miller, PA-C

## 2016-11-24 NOTE — Telephone Encounter (Signed)
Referral to sports medicine placed.

## 2016-11-25 ENCOUNTER — Telehealth: Payer: Self-pay | Admitting: Medical

## 2016-11-25 DIAGNOSIS — M0580 Other rheumatoid arthritis with rheumatoid factor of unspecified site: Secondary | ICD-10-CM

## 2016-11-25 DIAGNOSIS — R700 Elevated erythrocyte sedimentation rate: Secondary | ICD-10-CM

## 2016-11-25 LAB — RHEUMATOID FACTOR: Rhuematoid fact SerPl-aCnc: 47 IU/mL — ABNORMAL HIGH (ref <14)

## 2016-11-25 LAB — ANA: Anti Nuclear Antibody(ANA): NEGATIVE

## 2016-11-25 NOTE — Telephone Encounter (Signed)
Referral to rheumatology placed

## 2016-11-26 ENCOUNTER — Encounter: Payer: Self-pay | Admitting: Family Medicine

## 2016-11-26 ENCOUNTER — Ambulatory Visit (INDEPENDENT_AMBULATORY_CARE_PROVIDER_SITE_OTHER): Payer: Medicare HMO | Admitting: Family Medicine

## 2016-11-26 DIAGNOSIS — S9782XA Crushing injury of left foot, initial encounter: Secondary | ICD-10-CM

## 2016-11-26 NOTE — Patient Instructions (Signed)
You have a foot/ankle contusion. While you do have a very small fragment distal to the fibula I suspect this is an accessory ossicle or from an old ankle injury. Elevate above your heart when possible. Tylenol if needed for pain. Wear the short boot or comfortable shoes when you're going to be up and walking around. At least twice a day do motion exercises I showed you (up/downs and alphabet). Keep checking your feet for any sores or irritation of the skin. Follow up with me in 2 weeks for reevaluation. I anticipate adding the strengthening exercises at that visit.

## 2016-11-28 DIAGNOSIS — S9782XA Crushing injury of left foot, initial encounter: Secondary | ICD-10-CM | POA: Insufficient documentation

## 2016-11-28 NOTE — Progress Notes (Signed)
PCP: Mackie Pai, PA-C  Subjective:   HPI: Patient is a 66 y.o. female here for left foot/ankle injury.  Patient reports on 9/13 she fell out of bed but does not recall an injury to her left foot or ankle. Then on 9/15 she accidentally slammed her left proximal foot and ankle into the car door. She has neuropathy in both feet at baseline and pain associated with this. She also has history of gout and on allopurinol. No new skin changes  Past Medical History:  Diagnosis Date  . Allergy   . Anxiety   . Arthritis   . Asthma   . Atrial fibrillation ([place])   . CHF (congestive heart failure) ([place])   . COPD (chronic obstructive pulmonary disease) ([place])   . Diabetes mellitus without complication ([place])   . Hyperlipidemia   . Hypertension   . Myocardial infarction ([place])    several  . Pacemaker    CRT-P with RV lead and His Bundle lead ( high threshold)   . Peripheral neuropathy   . Pneumonia   . PONV (postoperative nausea and vomiting)    unsure of exactly what happened at [facility] in 2010 (knee surgery) that led to crital care  . Thyroid disease     Current Outpatient Prescriptions on File Prior to Visit  Medication Sig Dispense Refill  . acetaminophen (TYLENOL) 500 MG tablet Take 1,000 mg by mouth daily as needed for moderate pain.    Marland Kitchen albuterol (PROVENTIL HFA;VENTOLIN HFA) 108 (90 Base) MCG/ACT inhaler Inhale 2 puffs into the lungs every 6 (six) hours as needed for wheezing or shortness of breath. 1 Inhaler 2  . albuterol (PROVENTIL) (2.5 MG/3ML) 0.083% nebulizer solution Take 3 mLs (2.5 mg total) by nebulization every 6 (six) hours as needed for wheezing or shortness of breath. 150 mL 1  . allopurinol (ZYLOPRIM) 100 MG tablet Take ONE (1) tablet by mouth once daily 90 tablet 3  . AMBULATORY NON FORMULARY MEDICATION Motorized scooter.  Diagnosis: Osteoarthritis M19.90 1 each 0  . apixaban (ELIQUIS) 5 MG TABS tablet Take 1 tablet (5 mg total) by mouth 2 (two) times  daily. 180 tablet 1  . Blood Glucose Monitoring Suppl (GLUCOCOM BLOOD GLUCOSE MONITOR) DEVI Use to check blood sugars 6 times daily E11.65    . colchicine 0.6 MG tablet Take 1 tablet (0.6 mg total) by mouth 2 (two) times daily. 60 tablet 0  . DULoxetine (CYMBALTA) 30 MG capsule Take 1 capsule (30 mg total) by mouth at bedtime. 90 capsule 0  . fluticasone (FLONASE) 50 MCG/ACT nasal spray Place 2 sprays into both nostrils at bedtime.    . fluticasone (FLOVENT HFA) 110 MCG/ACT inhaler Inhale 2 puffs into the lungs 2 (two) times daily as needed (bronchitis).    . Insulin Disposable Pump (V-GO 40) KIT Inject into the skin See admin instructions. Use with Novolog - 6 pumps before each meal    . levothyroxine (SYNTHROID, LEVOTHROID) 137 MCG tablet Take 137 mcg by mouth daily before breakfast.    . metoprolol succinate (TOPROL-XL) 25 MG 24 hr tablet Take 1 tablet (25 mg total) by mouth daily. 90 tablet 1  . NOVOLOG 100 UNIT/ML injection 100 units per day in VGO unknown bolus    . OVER THE COUNTER MEDICATION Take 0.5 tablets by mouth at bedtime. THC Candy from [place]    . oxyCODONE-acetaminophen (PERCOCET/ROXICET) 5-325 MG tablet Take 1 tablet by mouth every 6 (six) hours as needed for severe pain. 12 tablet  0  . Prenatal Vit-Fe Fumarate-FA (MULTIVITAMIN-PRENATAL) 27-0.8 MG TABS tablet Take 1 tablet by mouth daily. Takes for nail and hair growth     . ramipril (ALTACE) 2.5 MG capsule Take 2.5 mg by mouth at bedtime.    . torsemide (DEMADEX) 10 MG tablet Take 1 tablet (10 mg total) by mouth daily. 90 tablet 0   No current facility-administered medications on file prior to visit.     Past Surgical History:  Procedure Laterality Date  . ABDOMINAL AORTOGRAM W/LOWER EXTREMITY N/A 04/29/2016   Procedure: Abdominal Aortogram w/Lower Extremity;  Surgeon: Waynetta Sandy, MD;  Location: [place] [facility];  Service: Cardiovascular;  Laterality: N/A;  . ABDOMINAL HYSTERECTOMY    . AMPUTATION Right  07/30/2016   Procedure: AMPUTATION RIGHT SECOND TOE;  Surgeon: Angelia Mould, MD;  Location: [place];  Service: Vascular;  Laterality: Right;  . CARDIAC CATHETERIZATION     2005 at [facility]  . PERIPHERAL VASCULAR ATHERECTOMY Right 04/29/2016   Procedure: Peripheral Vascular Atherectomy-Right Popliteal;  Surgeon: Waynetta Sandy, MD;  Location: [place] [facility];  Service: Cardiovascular;  Laterality: Right;  . PERIPHERAL VASCULAR INTERVENTION Right 04/29/2016   Procedure: Peripheral Vascular Intervention-Right Popliteal;  Surgeon: Waynetta Sandy, MD;  Location: [place] [facility];  Service: Cardiovascular;  Laterality: Right;  POPLITEAL PTA  . RADIOFREQUENCY ABLATION    . TOTAL KNEE ARTHROPLASTY    . TUBAL LIGATION      Allergies  Allergen Reactions  . Indomethacin Other (See Comments)     Renal Insufficiency  . Pregabalin Other (See Comments)    DIZZINESS   . Sulfa Antibiotics Hives  . Morphine And Related Nausea And Vomiting    Social History   Social History  . Marital status: Married    Spouse name: N/A  . Number of children: N/A  . Years of education: N/A   Occupational History  . retired    Social History Main Topics  . Smoking status: Former Smoker    Quit date: 03/01/1998  . Smokeless tobacco: Never Used  . 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  . Drug use: Yes    Types: Marijuana     Comment: remote use  . Sexual activity: No   Other Topics Concern  . Not on file   Social History Narrative  . No narrative on file    Family History  Problem Relation Age of Onset  . Diabetes Mother   . Hyperlipidemia Mother   . Diabetes Father   . Hyperlipidemia Father   . Heart disease Father        before age 20  . Heart attack Father   . Diabetes Brother   . Hyperlipidemia Brother   . Heart attack Brother     BP 139/66   Ht 5' 2"  (1.575 m)   Wt 225 lb (102.1 kg)   BMI 41.15 kg/m    Review of Systems: See HPI above.     Objective:  Physical Exam:  Gen: NAD, comfortable in exam room  Left foot/ankle: No gross deformity, swelling, ecchymoses.   FROM ankle and digits. TTP diffusely about anterior ankle including both malleoli, proximal foot. Negative ant drawer and talar tilt.   Negative syndesmotic compression. Thompsons test negative. 2+ dp pulse though foot slightly cold.  Cap refill < 3 sec.   Assessment & Plan:  1. Left foot/ankle injury - independently reviewed radiographs.  While she has a small fragment distal to lateral malleolus her mechanism would be very unusual to cause this (more commonly seen with inversion) - regardless this would be treated with conservative treatment over 4-6 weeks.  Tylenol if needed.  Elevation.  Comfortable shoes or short cam walker.  Shown motion exercises to do daily.  F/u in 2 weeks for reevaluation - hope to add strengthening at that visit.

## 2016-11-28 NOTE — Assessment & Plan Note (Signed)
independently reviewed radiographs.  While she has a small fragment distal to lateral malleolus her mechanism would be very unusual to cause this (more commonly seen with inversion) - regardless this would be treated with conservative treatment over 4-6 weeks.  Tylenol if needed.  Elevation.  Comfortable shoes or short cam walker.  Shown motion exercises to do daily.  F/u in 2 weeks for reevaluation - hope to add strengthening at that visit.

## 2016-11-29 ENCOUNTER — Telehealth: Payer: Self-pay | Admitting: Medical

## 2016-11-29 MED ORDER — GABAPENTIN 100 MG PO CAPS: 100.0000 mg | ORAL_CAPSULE | Freq: Every day | ORAL | 0 refills | Status: DC

## 2016-11-29 NOTE — Telephone Encounter (Signed)
rx gabapentin sent to pt pharmacy.

## 2016-11-30 ENCOUNTER — Encounter: Payer: Self-pay | Admitting: Medical

## 2016-11-30 ENCOUNTER — Telehealth: Payer: Self-pay | Admitting: Medical

## 2016-11-30 NOTE — Telephone Encounter (Signed)
Dr Estanislado Pandy declined referral for this pt. Can you refer to other rheumatologist?

## 2016-11-30 NOTE — Telephone Encounter (Signed)
Referral faxed to [facility], awaiting appt

## 2016-12-01 ENCOUNTER — Telehealth: Payer: Self-pay | Admitting: Medical

## 2016-12-01 NOTE — Telephone Encounter (Signed)
Pt is requesting more percocet. She has a controlled med contract signsed in march but then her pain eased up and now has new injury. Do we have a recent drug screen on her? When was last could not find in epic. Will you run a state data base hx on her and let me review. Thanks.

## 2016-12-02 NOTE — Telephone Encounter (Signed)
Pt last uds 07/30/16 Data base on desk

## 2016-12-03 ENCOUNTER — Telehealth: Payer: Self-pay | Admitting: Medical

## 2016-12-03 MED ORDER — OXYCODONE-ACETAMINOPHEN 5-325 MG PO TABS: 1.0000 | ORAL_TABLET | Freq: Four times a day (QID) | ORAL | 0 refills | Status: DC | PRN

## 2016-12-03 NOTE — Telephone Encounter (Signed)
Pt can pick up the percocet rx today or can pick on Monday am.

## 2016-12-06 NOTE — Telephone Encounter (Signed)
Left pt a message notifying her rx is ready for pick up

## 2016-12-10 ENCOUNTER — Encounter: Payer: Self-pay | Admitting: Medical

## 2016-12-13 ENCOUNTER — Ambulatory Visit: Payer: Medicare HMO | Admitting: Family Medicine

## 2016-12-13 NOTE — Telephone Encounter (Signed)
Alexis [name] Self [facility]  DULoxetine (CYMBALTA) 30 MG capsule  Alexis [name] called and needs to get this refilled as soon as possible, she no longer has the [employer] order. Please call and advise

## 2016-12-14 ENCOUNTER — Telehealth: Payer: Self-pay | Admitting: Medical

## 2016-12-14 MED ORDER — ALLOPURINOL 100 MG PO TABS: 100.0000 mg | ORAL_TABLET | Freq: Every day | ORAL | 2 refills | Status: DC

## 2016-12-14 MED ORDER — DULOXETINE HCL 30 MG PO CPEP: 30.0000 mg | ORAL_CAPSULE | Freq: Every day | ORAL | 0 refills | Status: DC

## 2016-12-14 NOTE — Telephone Encounter (Signed)
Rx of cymbalta sent to pharmacy/

## 2016-12-14 NOTE — Telephone Encounter (Signed)
Elisama Thissen Self [phone]  [name]   DULoxetine (CYMBALTA) 30 MG capsule  Patient called again today to check on the status refill, she is completely out.

## 2016-12-14 NOTE — Telephone Encounter (Signed)
Wilhemina Grall Self [phone]  [name] - [place], [place] - [address] [phone] (Phone) [phone] (Fax)     allopurinol (ZYLOPRIM) 100 MG tablet   Laurena called to check on the status of this refill. Please call and advise.

## 2016-12-14 NOTE — Telephone Encounter (Signed)
Refills have been sent to [facility]. Pt informed via MyChart.

## 2016-12-15 ENCOUNTER — Telehealth: Payer: Self-pay | Admitting: Medical

## 2016-12-15 ENCOUNTER — Encounter: Payer: Self-pay | Admitting: Medical

## 2016-12-15 NOTE — Telephone Encounter (Signed)
Open to review

## 2016-12-15 NOTE — Telephone Encounter (Signed)
[name] declined receiving allopurinol (ZYLOPRIM) 100 MG tablet and DULoxetine (CYMBALTA) 30 MG capsule, pharmacy requesting verbal order, please advise  Alexis [name], Alexis [name] - [address] [phone] (Phone) [phone] (Fax)

## 2016-12-15 NOTE — Telephone Encounter (Signed)
Open to review. It looks like I got her request to fill her Cymbalta yesterday. Looks like a sent the prescription but has not filled. On her my chart and her phone call she states to MA that she  stopped Cymbalta t for 8 days and feels like she is in withdrawal. I advised my medical assistant today to have her schedule appointment with me tomorrow morning. Patient had mentioned to my medical assistant during the phone call that feels like her heart racing. I advised medical assistant t that if she needs to be seen tonight then urgent care or emergency department is an option. I will try to call patient now but with her reported symptoms I think she needs evaluation rather than medication for presumed Cymbalta withdrawal?  I tried to call patient after hours but there was no answer.  I may send her a my chart message later tonight but I really don't feel comfortable under the circumstances giving advice through my chart.

## 2016-12-15 NOTE — Telephone Encounter (Signed)
Pt says that she is having withdrawals from singular. (Per provider she should no longer take) pt says that she is unable to sleep. She would like to be advised further.    CB: [phone]

## 2016-12-15 NOTE — Telephone Encounter (Signed)
Pt schedule for tomorrow.

## 2016-12-15 NOTE — Telephone Encounter (Signed)
I don't see that singulair is on her allergy list. Did she have any side effects while using. Singulair if taken at night can cause drowsiness and help her sleep as well as help with allergies. If she was taking it at night and now suffering insomnia she could get back on. I tried to find in chart why I  may have advised getting off. I can't remember and did not find note to that effect.  She does have some allergic rhinitis  history so in absence of side effect I think she can take.   Did anyone else advise her to stop singulair. Maybe pharmacy?

## 2016-12-16 ENCOUNTER — Encounter: Payer: Self-pay | Admitting: Medical

## 2016-12-16 ENCOUNTER — Ambulatory Visit (INDEPENDENT_AMBULATORY_CARE_PROVIDER_SITE_OTHER): Payer: Medicare HMO | Admitting: Medical

## 2016-12-16 VITALS — BP 140/80 | HR 71 | Temp 98.2°F | Resp 16 | Wt 225.8 lb

## 2016-12-16 DIAGNOSIS — M2550 Pain in unspecified joint: Secondary | ICD-10-CM | POA: Diagnosis not present

## 2016-12-16 DIAGNOSIS — I482 Chronic atrial fibrillation, unspecified: Secondary | ICD-10-CM

## 2016-12-16 DIAGNOSIS — G629 Polyneuropathy, unspecified: Secondary | ICD-10-CM

## 2016-12-16 DIAGNOSIS — R002 Palpitations: Secondary | ICD-10-CM | POA: Diagnosis not present

## 2016-12-16 DIAGNOSIS — R5383 Other fatigue: Secondary | ICD-10-CM

## 2016-12-16 LAB — COMPREHENSIVE METABOLIC PANEL
ALK PHOS: 184 U/L — AB (ref 39–117)
ALT: 43 U/L — AB (ref 0–35)
AST: 52 U/L — ABNORMAL HIGH (ref 0–37)
Albumin: 4.1 g/dL (ref 3.5–5.2)
BILIRUBIN TOTAL: 0.6 mg/dL (ref 0.2–1.2)
BUN: 24 mg/dL — ABNORMAL HIGH (ref 6–23)
CALCIUM: 10.3 mg/dL (ref 8.4–10.5)
CO2: 32 meq/L (ref 19–32)
Chloride: 97 mEq/L (ref 96–112)
Creatinine, Ser: 0.81 mg/dL (ref 0.40–1.20)
GFR: 74.77 mL/min (ref >60.00)
GLUCOSE: 190 mg/dL — AB (ref 70–99)
POTASSIUM: 3.9 meq/L (ref 3.5–5.1)
Sodium: 137 mEq/L (ref 135–145)
Total Protein: 8 g/dL (ref 6.0–8.3)

## 2016-12-16 LAB — CBC WITH DIFFERENTIAL/PLATELET
Basophils Absolute: 0 10*3/uL (ref 0.0–0.1)
Basophils Relative: 0.4 % (ref 0.0–3.0)
Eosinophils Absolute: 0 10*3/uL (ref 0.0–0.7)
Eosinophils Relative: 1.1 % (ref 0.0–5.0)
HCT: 45.3 % (ref 36.0–46.0)
HEMOGLOBIN: 14.8 g/dL (ref 12.0–15.0)
LYMPHS ABS: 1 10*3/uL (ref 0.7–4.0)
Lymphocytes Relative: 26.4 % (ref 12.0–46.0)
MCHC: 32.7 g/dL (ref 30.0–36.0)
MCV: 98.8 fl (ref 78.0–100.0)
MONOS PCT: 9.1 % (ref 3.0–12.0)
Monocytes Absolute: 0.4 10*3/uL (ref 0.1–1.0)
NEUTROS PCT: 63 % (ref 43.0–77.0)
Neutro Abs: 2.5 10*3/uL (ref 1.4–7.7)
Platelets: 75 10*3/uL — ABNORMAL LOW (ref 150.0–400.0)
RBC: 4.59 Mil/uL (ref 3.87–5.11)
RDW: 15 % (ref 11.5–15.5)
WBC: 3.9 10*3/uL — ABNORMAL LOW (ref 4.0–10.5)

## 2016-12-16 LAB — TROPONIN I: TNIDX: 0.03 ug/l (ref 0.00–0.06)

## 2016-12-16 LAB — TSH: TSH: 15.5 u[IU]/mL — AB (ref 0.35–4.50)

## 2016-12-16 MED ORDER — DULOXETINE HCL 30 MG PO CPEP: 30.0000 mg | ORAL_CAPSULE | Freq: Every day | ORAL | 0 refills | Status: DC

## 2016-12-16 NOTE — Progress Notes (Signed)
Subjective:    Patient ID: Alexis [name], female    DOB: 13-May-1950, 66 y.o.   MRN: [id number]  HPI   Pt in for evaluation.  Pt states pharmacy ran out of cymbalta. She has been out of meds for 8 days. I refilled pt cymbalta on 12-14-2016 but pt now wants to not take cymbalta. She states it really never helped. Has history of neuropathy and had side effects to gabapentin as well.  Pt attributed recent fast heart rate to having anxiety stopping the cymbalta. She states would sweat little bit at night at night. Pt checked her sugar and was 118. She states during the day she felt hyped up and stimulated. She also thinks this was related to stopping cymbalta.  Today she feels better.   Body aches/arthralgia also. Rheumatologist will see pt December 18,2018.  Today tachycardia sensation resolved but now feels fatigued. Hx of atria fibrillation but rate controlled.    Review of Systems  Constitutional: Negative for chills and fatigue.  HENT: Positive for congestion. Negative for postnasal drip, rhinorrhea, sinus pain, sinus pressure and tinnitus.        Faint nasal congestion.  Eyes: Negative for redness, itching and visual disturbance.  Respiratory: Negative for cough, chest tightness, shortness of breath and wheezing.   Cardiovascular: Negative for chest pain and palpitations.  Gastrointestinal: Negative for abdominal pain.  Endocrine: Negative for polydipsia and polyuria.  Musculoskeletal: Negative for back pain and neck pain.  Skin: Negative for rash.  Neurological: Negative for dizziness, speech difficulty, weakness, numbness and headaches.  Hematological: Negative for adenopathy. Does not bruise/bleed easily.  Psychiatric/Behavioral: Negative for behavioral problems, confusion, hallucinations, sleep disturbance and suicidal ideas. The patient is nervous/anxious.     Past Medical History:  Diagnosis Date  . Allergy   . Anxiety   . Arthritis   . Asthma   . Atrial  fibrillation ([place])   . CHF (congestive heart failure) ([place])   . COPD (chronic obstructive pulmonary disease) ([place])   . Diabetes mellitus without complication ([place])   . Hyperlipidemia   . Hypertension   . Myocardial infarction ([place])    several  . Pacemaker    CRT-P with RV lead and His Bundle lead ( high threshold)   . Peripheral neuropathy   . Pneumonia   . PONV (postoperative nausea and vomiting)    unsure of exactly what happened at [facility] in 2010 (knee surgery) that led to crital care  . Thyroid disease      Social History   Social History  . Marital status: Married    Spouse name: N/A  . Number of children: N/A  . Years of education: N/A   Occupational History  . retired    Social History Main Topics  . Smoking status: Former Smoker    Quit date: 03/01/1998  . Smokeless tobacco: Never Used  . 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  . Drug use: Yes    Types: Marijuana     Comment: remote use  . Sexual activity: No   Other Topics Concern  . Not on file   Social History Narrative  . No narrative on file    Past Surgical History:  Procedure Laterality Date  . ABDOMINAL AORTOGRAM W/LOWER EXTREMITY N/A 04/29/2016   Procedure: Abdominal Aortogram w/Lower Extremity;  Surgeon: Waynetta Sandy, MD;  Location: [place] [facility];  Service: Cardiovascular;  Laterality: N/A;  .  ABDOMINAL HYSTERECTOMY    . AMPUTATION Right 07/30/2016   Procedure: AMPUTATION RIGHT SECOND TOE;  Surgeon: Angelia Mould, MD;  Location: [place];  Service: Vascular;  Laterality: Right;  . CARDIAC CATHETERIZATION     2005 at [facility]  . PERIPHERAL VASCULAR ATHERECTOMY Right 04/29/2016   Procedure: Peripheral Vascular Atherectomy-Right Popliteal;  Surgeon: Waynetta Sandy, MD;  Location: [place] [facility];  Service: Cardiovascular;  Laterality: Right;  . PERIPHERAL VASCULAR INTERVENTION Right 04/29/2016    Procedure: Peripheral Vascular Intervention-Right Popliteal;  Surgeon: Waynetta Sandy, MD;  Location: [place] [facility];  Service: Cardiovascular;  Laterality: Right;  POPLITEAL PTA  . RADIOFREQUENCY ABLATION    . TOTAL KNEE ARTHROPLASTY    . TUBAL LIGATION      Family History  Problem Relation Age of Onset  . Diabetes Mother   . Hyperlipidemia Mother   . Diabetes Father   . Hyperlipidemia Father   . Heart disease Father        before age 21  . Heart attack Father   . Diabetes Brother   . Hyperlipidemia Brother   . Heart attack Brother     Allergies  Allergen Reactions  . Indomethacin Other (See Comments)     Renal Insufficiency  . Pregabalin Other (See Comments)    DIZZINESS   . Sulfa Antibiotics Hives  . Morphine And Related Nausea And Vomiting    Current Outpatient Prescriptions on File Prior to Visit  Medication Sig Dispense Refill  . acetaminophen (TYLENOL) 500 MG tablet Take 1,000 mg by mouth daily as needed for moderate pain.    Marland Kitchen albuterol (PROVENTIL HFA;VENTOLIN HFA) 108 (90 Base) MCG/ACT inhaler Inhale 2 puffs into the lungs every 6 (six) hours as needed for wheezing or shortness of breath. 1 Inhaler 2  . albuterol (PROVENTIL) (2.5 MG/3ML) 0.083% nebulizer solution Take 3 mLs (2.5 mg total) by nebulization every 6 (six) hours as needed for wheezing or shortness of breath. 150 mL 1  . allopurinol (ZYLOPRIM) 100 MG tablet Take 1 tablet (100 mg total) by mouth daily. 90 tablet 2  . AMBULATORY NON FORMULARY MEDICATION Motorized scooter.  Diagnosis: Osteoarthritis M19.90 1 each 0  . apixaban (ELIQUIS) 5 MG TABS tablet Take 1 tablet (5 mg total) by mouth 2 (two) times daily. 180 tablet 1  . Blood Glucose Monitoring Suppl (GLUCOCOM BLOOD GLUCOSE MONITOR) DEVI Use to check blood sugars 6 times daily E11.65    . DULoxetine (CYMBALTA) 30 MG capsule Take 1 capsule (30 mg total) by mouth at bedtime. 90 capsule 0  . fluticasone (FLONASE) 50 MCG/ACT nasal spray Place  2 sprays into both nostrils at bedtime.    . fluticasone (FLOVENT HFA) 110 MCG/ACT inhaler Inhale 2 puffs into the lungs 2 (two) times daily as needed (bronchitis).    . Insulin Disposable Pump (V-GO 40) KIT Inject into the skin See admin instructions. Use with Novolog - 6 pumps before each meal    . levothyroxine (SYNTHROID, LEVOTHROID) 137 MCG tablet Take 137 mcg by mouth daily before breakfast.    . metoprolol succinate (TOPROL-XL) 25 MG 24 hr tablet Take 1 tablet (25 mg total) by mouth daily. 90 tablet 1  . NOVOLOG 100 UNIT/ML injection 100 units per day in VGO unknown bolus    . OVER THE COUNTER MEDICATION Take 0.5 tablets by mouth at bedtime. THC Candy from [place]    . oxyCODONE-acetaminophen (PERCOCET/ROXICET) 5-325 MG tablet Take 1 tablet by mouth every 6 (six)  hours as needed for severe pain. 16 tablet 0  . Prenatal Vit-Fe Fumarate-FA (MULTIVITAMIN-PRENATAL) 27-0.8 MG TABS tablet Take 1 tablet by mouth daily. Takes for nail and hair growth     . ramipril (ALTACE) 2.5 MG capsule Take 2.5 mg by mouth at bedtime.    . torsemide (DEMADEX) 10 MG tablet Take 1 tablet (10 mg total) by mouth daily. 90 tablet 0   No current facility-administered medications on file prior to visit.     BP (!) 152/77   Pulse 71   Temp 98.2 F (36.8 C) (Oral)   Resp 16   Wt 225 lb 12.8 oz (102.4 kg)   SpO2 100%   BMI 41.30 kg/m       Objective:   Physical Exam  General  Mental Status - Alert. General Appearance - Well groomed. Not in acute distress.  Skin Rashes- No Rashes.  HEENT Head- Normal. Ear Auditory Canal - Left- Normal. Right - Normal.Tympanic Membrane- Left- Normal. Right- Normal. Eye Sclera/Conjunctiva- Left- Normal. Right- Normal. Nose & Sinuses Nasal Mucosa- Left-  Boggy and Congested. Right-  Boggy and  Congested.Bilateral no  maxillary and no  frontal sinus pressure. Mouth & Throat Lips: Upper Lip- Normal: no dryness, cracking, pallor, cyanosis, or vesicular eruption. Lower  Lip-Normal: no dryness, cracking, pallor, cyanosis or vesicular eruption. Buccal Mucosa- Bilateral- No Aphthous ulcers. Oropharynx- No Discharge or Erythema. Tonsils: Characteristics- Bilateral- No Erythema or Congestion. Size/Enlargement- Bilateral- No enlargement. Discharge- bilateral-None.  Neck Neck- Supple. No Masses.   Chest and Lung Exam Auscultation: Breath Sounds:-Clear even and unlabored.  Cardiovascular Auscultation:Rythm- Regular, rate and rhythm. Murmurs & Other Heart Sounds:Ausculatation of the heart reveal- No Murmurs.  Lymphatic Head & Neck General Head & Neck Lymphatics: Bilateral: Description- No Localized lymphadenopathy.       Assessment & Plan:  For your recent palpitations/tachycardia sensation last night with subsequent fatigue today, we did eKG today. The EKG showed rate-controlled atrial fibrillation. Some PVCs as well. I compared today's EKG with EKG in February and looks very similar with no acute changes(both ekg showed st wave abnormality same leads). Also will get troponin test today to make sure that is normal. Your description of heart beating out of her chest last night causes me to be cautious. Continue your metoprolol and eliquis. If you do have recurrent subjective tachycardia symptoms let me know and I will refer you back to your cardiologist.  For your fatigue will also get CBC ,CMP and TSH.  For your neuropathy you might reconsider starting the cymbalta. I am not sure what the issue was getting the medication filled at the pharmacy. If you do decide to restart and you need to stop the medication in the future then I would taper you off the medication.   For joint pains pending rheumatology evaluation in December he could use low-dose ibuprofen 200-400 mg every 8 hours as needed.  Follow-up in 3 weeks or as needed.

## 2016-12-16 NOTE — Patient Instructions (Addendum)
For your recent palpitations/tachycardia sensation last night with subsequent fatigue today, we did eKG today. The EKG showed rate-controlled atrial fibrillation. Some PVCs as well. I compared today's EKG with EKG in February and looks very similar with no acute changes. Also will get troponin test today to make sure that is normal. Your description of heart beating out of her chest last night causes me to be cautious. Continue your metoprolol and eliquis. If you do have recurrent subjective tachycardia symptoms let me know and I will refer you back to your cardiologist.  For your fatigue will also get CBC ,CMP and TSH.  For your neuropathy you might reconsider starting the cymbalta(has some benefit antidepression, anti-anxiety and neruopathy pain med). I am not sure what the issue was getting the medication filled at the pharmacy. If you do decide to restart and you need to stop the medication in the future then I would taper you off the medication.   For joint pains pending rheumatology evaluation in December he could use low-dose ibuprofen 200-400 mg every 8 hours as needed.  Follow-up in 3 weeks or as needed.

## 2016-12-19 ENCOUNTER — Telehealth: Payer: Self-pay | Admitting: Medical

## 2016-12-19 MED ORDER — LEVOTHYROXINE SODIUM 150 MCG PO CAPS: 1.0000 | ORAL_CAPSULE | Freq: Every day | ORAL | 3 refills | Status: DC

## 2016-12-19 NOTE — Telephone Encounter (Signed)
rx higher dose of thyroid med sent to pharmacy.

## 2016-12-20 ENCOUNTER — Encounter: Payer: Self-pay | Admitting: Medical

## 2016-12-22 NOTE — Telephone Encounter (Signed)
Left pt a message letting her know rx sent to pharmacy.

## 2017-01-01 ENCOUNTER — Encounter: Payer: Self-pay | Admitting: Medical

## 2017-01-03 MED ORDER — RAMIPRIL 2.5 MG PO CAPS: 2.5000 mg | ORAL_CAPSULE | Freq: Every day | ORAL | 1 refills | Status: DC

## 2017-01-14 ENCOUNTER — Ambulatory Visit (INDEPENDENT_AMBULATORY_CARE_PROVIDER_SITE_OTHER): Payer: Medicare HMO | Admitting: Medical

## 2017-01-14 ENCOUNTER — Encounter: Payer: Self-pay | Admitting: Medical

## 2017-01-14 VITALS — BP 141/98 | HR 76 | Temp 97.5°F | Resp 16 | Ht 62.0 in

## 2017-01-14 DIAGNOSIS — G629 Polyneuropathy, unspecified: Secondary | ICD-10-CM

## 2017-01-14 DIAGNOSIS — I739 Peripheral vascular disease, unspecified: Secondary | ICD-10-CM

## 2017-01-14 DIAGNOSIS — Z23 Encounter for immunization: Secondary | ICD-10-CM | POA: Diagnosis not present

## 2017-01-14 DIAGNOSIS — Z89421 Acquired absence of other right toe(s): Secondary | ICD-10-CM | POA: Diagnosis not present

## 2017-01-14 DIAGNOSIS — S98131A Complete traumatic amputation of one right lesser toe, initial encounter: Secondary | ICD-10-CM

## 2017-01-14 NOTE — Progress Notes (Signed)
Subjective:    Patient ID: Alexis [name], female    DOB: [birth year], 66 y.o.   MRN: [id number]  HPI Pt in today for evaluation. To discuss getting mobility device. 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 Pt had some debridement of non-viable tissue. She had been seeing wound care doctor. 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 Pt is diabetic. She had distal SFA and popliteal artery that was occluded. She had  procedure to establish flow.   Pt wanted mobility device.(It appears would benefit from mobility device). Pt has been using regular wheel chair. Husband push her but when she is alone describes some difficulty using. Pt has walker but has difficulty using.  She also reports for while has been using mobility scooter at stores. She states her neuropathy pain is much worse when she walks.  Above is from prior visit. Still applicable/same today  On review today still has diabetic neuropathy. She states has pain in area where she had 2 nd toe type pain. States phantom type pain.  Her rt foot hurts when she tries to walk.  In past we tried to get mobility device but script never went  through with insurance.  Pt has to go to stores that have scooters. Difficulty getting into stored. She states since 2010 after knee surgery has had to scooter in stores.  Pt still has knee pain in left knee/one replaced. Pt told by prior orthopedist she needed repeat knee replacement. Pt states due to post op complications first time she does not want to get repeat surgery.     Review of Systems  Constitutional: Negative for chills, fatigue and fever.  Respiratory: Negative for cough, chest tightness, shortness of breath and wheezing.   Cardiovascular: Negative for chest pain and palpitations.  Gastrointestinal: Negative for abdominal pain.  Musculoskeletal: Negative for back pain, joint swelling, neck pain and neck stiffness.       See hpi.  Skin: Negative  for rash.  Neurological: Negative for dizziness, syncope, weakness, numbness and headaches.  Hematological: Negative for adenopathy. Does not bruise/bleed easily.  Psychiatric/Behavioral: Negative for behavioral problems, confusion and dysphoric mood. The patient is not nervous/anxious.    Past Medical History:  Diagnosis Date  . Allergy   . Anxiety   . Arthritis   . Asthma   . Atrial fibrillation ([place])   . CHF (congestive heart failure) ([place])   . COPD (chronic obstructive pulmonary disease) ([place])   . Diabetes mellitus without complication ([place])   . Hyperlipidemia   . Hypertension   . Myocardial infarction ([place])    several  . Pacemaker    CRT-P with RV lead and His Bundle lead ( high threshold)   . Peripheral neuropathy   . Pneumonia   . PONV (postoperative nausea and vomiting)    unsure of exactly what happened at [facility] in 2010 (knee surgery) that led to crital care  . Thyroid disease      Social History   Socioeconomic History  . Marital status: Married    Spouse name: Not on file  . Number of children: Not on file  . Years of education: Not on file  . Highest education level: Not on file  Social Needs  . Financial resource strain: Not on file  . Food insecurity - worry: Not on file  . Food insecurity - inability: Not on file  . Transportation needs - medical: Not on file  .  Transportation needs - non-medical: Not on file  Occupational History  . Occupation: retired  Tobacco Use  . Smoking status: Former Smoker    Last attempt to quit: 03/01/1998    Years since quitting: 18.8  . Smokeless tobacco: Never Used  Substance and Sexual Activity  . 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  . Drug use: Yes    Types: Marijuana    Comment: remote use  . Sexual activity: No  Other Topics Concern  . Not on file  Social History Narrative  . Not on file    Past Surgical History:    Procedure Laterality Date  . Abdominal Aortogram w/Lower Extremity N/A 04/29/2016   Performed by Waynetta Sandy, MD at [facility]  . ABDOMINAL HYSTERECTOMY    . AMPUTATION RIGHT SECOND TOE Right 07/30/2016   Performed by Angelia Mould, MD at [place]  . CARDIAC CATHETERIZATION     2005 at [facility]  . Peripheral Vascular Atherectomy-Right Popliteal Right 04/29/2016   Performed by Waynetta Sandy, MD at [place] [facility]  . Peripheral Vascular Intervention-Right Popliteal Right 04/29/2016   Performed by Waynetta Sandy, MD at [place] [facility]  . RADIOFREQUENCY ABLATION    . TOTAL KNEE ARTHROPLASTY    . TUBAL LIGATION      Family History  Problem Relation Age of Onset  . Diabetes Mother   . Hyperlipidemia Mother   . Diabetes Father   . Hyperlipidemia Father   . Heart disease Father        before age 80  . Heart attack Father   . Diabetes Brother   . Hyperlipidemia Brother   . Heart attack Brother     Allergies  Allergen Reactions  . Indomethacin Other (See Comments)     Renal Insufficiency  . Pregabalin Other (See Comments)    DIZZINESS   . Sulfa Antibiotics Hives  . Morphine And Related Nausea And Vomiting    Current Outpatient Medications on File Prior to Visit  Medication Sig Dispense Refill  . acetaminophen (TYLENOL) 500 MG tablet Take 1,000 mg by mouth daily as needed for moderate pain.    Marland Kitchen albuterol (PROVENTIL HFA;VENTOLIN HFA) 108 (90 Base) MCG/ACT inhaler Inhale 2 puffs into the lungs every 6 (six) hours as needed for wheezing or shortness of breath. 1 Inhaler 2  . albuterol (PROVENTIL) (2.5 MG/3ML) 0.083% nebulizer solution Take 3 mLs (2.5 mg total) by nebulization every 6 (six) hours as needed for wheezing or shortness of breath. 150 mL 1  . allopurinol (ZYLOPRIM) 100 MG tablet Take 1 tablet (100 mg total) by mouth daily. 90 tablet 2  . AMBULATORY NON FORMULARY MEDICATION Motorized scooter.  Diagnosis:  Osteoarthritis M19.90 1 each 0  . apixaban (ELIQUIS) 5 MG TABS tablet Take 1 tablet (5 mg total) by mouth 2 (two) times daily. 180 tablet 1  . Blood Glucose Monitoring Suppl (GLUCOCOM BLOOD GLUCOSE MONITOR) DEVI Use to check blood sugars 6 times daily E11.65    . DULoxetine (CYMBALTA) 30 MG capsule Take 1 capsule (30 mg total) by mouth at bedtime. 90 capsule 0  . fluticasone (FLONASE) 50 MCG/ACT nasal spray Place 2 sprays into both nostrils at bedtime.    . fluticasone (FLOVENT HFA) 110 MCG/ACT inhaler Inhale 2 puffs into the lungs 2 (two) times daily as needed (bronchitis).    . Insulin Disposable  Pump (V-GO 40) KIT Inject into the skin See admin instructions. Use with Novolog - 6 pumps before each meal    . Levothyroxine Sodium 150 MCG CAPS Take 1 capsule (150 mcg total) by mouth daily before breakfast. 30 capsule 3  . metoprolol succinate (TOPROL-XL) 25 MG 24 hr tablet Take 1 tablet (25 mg total) by mouth daily. 90 tablet 1  . NOVOLOG 100 UNIT/ML injection 100 units per day in VGO unknown bolus    . OVER THE COUNTER MEDICATION Take 0.5 tablets by mouth at bedtime. THC Candy from [place]    . oxyCODONE-acetaminophen (PERCOCET/ROXICET) 5-325 MG tablet Take 1 tablet by mouth every 6 (six) hours as needed for severe pain. 16 tablet 0  . Prenatal Vit-Fe Fumarate-FA (MULTIVITAMIN-PRENATAL) 27-0.8 MG TABS tablet Take 1 tablet by mouth daily. Takes for nail and hair growth     . ramipril (ALTACE) 2.5 MG capsule Take 1 capsule (2.5 mg total) at bedtime by mouth. 90 capsule 1  . torsemide (DEMADEX) 10 MG tablet Take 1 tablet (10 mg total) by mouth daily. 90 tablet 0   No current facility-administered medications on file prior to visit.     BP (!) 141/98   Pulse 76   Temp (!) 97.5 F (36.4 C) (Oral)   Resp 16   Ht _0  (1.575 m)   SpO2 97%   BMI 41.30 kg/m       Objective:   Physical Exam  General- No acute distress. Pleasant patient. Neck- Full range of motion, no jvd Lungs- Clear,  even and unlabored. Heart- regular rate and rhythm. Neurologic- CNII- XII grossly intact.  Rt lower ext/foot- scar 2nd toe from prior amputation. No dc presently.  Lt knee- old scar present. No swelling. Flexion and extension crepitus.     Assessment & Plan:  Pt would benefit from mobility device to attend MD/provider appointments and to get around her home(for daily activities).  We will call [facility] to see what are next steps needed to get mobility device.  We will give you update once we get update.  Follow up mid January or as needed  Mackie Pai, [place]

## 2017-01-14 NOTE — Patient Instructions (Addendum)
Pt would benefit from mobility device to attend MD/provider appointments and to get around her home(for daily activities).  We will call [facility] to see what are next steps needed to get mobility device.  We will give you update once we get update.  Follow up mid January or as needed

## 2017-01-18 ENCOUNTER — Other Ambulatory Visit: Payer: Self-pay | Admitting: Interventional Cardiology

## 2017-01-18 NOTE — Telephone Encounter (Signed)
Eliquis 65m refill received; pt is 66yrs old, Wt-120.4kg, Crea-0.81 on 12/16/16, last seen by Dr. CCurt Bearson 06/09/2016; will send in refill to requested pharmacy.

## 2017-01-25 DIAGNOSIS — E1142 Type 2 diabetes mellitus with diabetic polyneuropathy: Secondary | ICD-10-CM | POA: Diagnosis not present

## 2017-01-25 DIAGNOSIS — Z794 Long term (current) use of insulin: Secondary | ICD-10-CM | POA: Diagnosis not present

## 2017-01-25 DIAGNOSIS — E782 Mixed hyperlipidemia: Secondary | ICD-10-CM | POA: Diagnosis not present

## 2017-01-25 DIAGNOSIS — E039 Hypothyroidism, unspecified: Secondary | ICD-10-CM | POA: Diagnosis not present

## 2017-01-25 DIAGNOSIS — I10 Essential (primary) hypertension: Secondary | ICD-10-CM | POA: Diagnosis not present

## 2017-02-12 ENCOUNTER — Encounter: Payer: Self-pay | Admitting: Medical

## 2017-02-14 ENCOUNTER — Telehealth: Payer: Self-pay | Admitting: Medical

## 2017-02-14 DIAGNOSIS — M2550 Pain in unspecified joint: Secondary | ICD-10-CM | POA: Diagnosis not present

## 2017-02-14 DIAGNOSIS — R768 Other specified abnormal immunological findings in serum: Secondary | ICD-10-CM | POA: Diagnosis not present

## 2017-02-14 DIAGNOSIS — G629 Polyneuropathy, unspecified: Secondary | ICD-10-CM | POA: Diagnosis not present

## 2017-02-14 DIAGNOSIS — E119 Type 2 diabetes mellitus without complications: Secondary | ICD-10-CM | POA: Diagnosis not present

## 2017-02-14 DIAGNOSIS — M109 Gout, unspecified: Secondary | ICD-10-CM | POA: Diagnosis not present

## 2017-02-14 DIAGNOSIS — Z794 Long term (current) use of insulin: Secondary | ICD-10-CM | POA: Diagnosis not present

## 2017-02-14 DIAGNOSIS — Z6841 Body Mass Index (BMI) 40.0 and over, adult: Secondary | ICD-10-CM | POA: Diagnosis not present

## 2017-02-14 DIAGNOSIS — M150 Primary generalized (osteo)arthritis: Secondary | ICD-10-CM | POA: Diagnosis not present

## 2017-02-14 DIAGNOSIS — M5030 Other cervical disc degeneration, unspecified cervical region: Secondary | ICD-10-CM | POA: Diagnosis not present

## 2017-02-14 MED ORDER — TORSEMIDE 10 MG PO TABS: 10.0000 mg | ORAL_TABLET | Freq: Every day | ORAL | 1 refills | Status: DC

## 2017-02-14 NOTE — Telephone Encounter (Signed)
Copied from [place] [id number]. Topic: Quick Communication - See Telephone Encounter >> Feb 14, 2017 11:40 AM Burnis Medin, NT wrote: CRM for notification. See Telephone encounter for: Pt because she needs a medication refill for torsemide (DEMADEX) 10 MG tablet. Pt uses [facility] in [facility]  02/14/17.

## 2017-02-16 ENCOUNTER — Encounter: Payer: Self-pay | Admitting: Medical

## 2017-03-07 ENCOUNTER — Encounter: Payer: Self-pay | Admitting: Medical

## 2017-03-08 DIAGNOSIS — R69 Illness, unspecified: Secondary | ICD-10-CM | POA: Diagnosis not present

## 2017-03-08 MED ORDER — DULOXETINE HCL 30 MG PO CPEP: 30.0000 mg | ORAL_CAPSULE | Freq: Every day | ORAL | 1 refills | Status: DC

## 2017-03-09 DIAGNOSIS — R69 Illness, unspecified: Secondary | ICD-10-CM | POA: Diagnosis not present

## 2017-03-11 ENCOUNTER — Encounter: Payer: Self-pay | Admitting: Medical

## 2017-03-15 ENCOUNTER — Ambulatory Visit (INDEPENDENT_AMBULATORY_CARE_PROVIDER_SITE_OTHER): Payer: Medicare HMO | Admitting: Medical

## 2017-03-15 ENCOUNTER — Encounter: Payer: Self-pay | Admitting: Medical

## 2017-03-15 ENCOUNTER — Telehealth: Payer: Self-pay | Admitting: Medical

## 2017-03-15 VITALS — BP 130/80 | HR 72 | Temp 97.8°F | Resp 16 | Ht 62.0 in | Wt 226.0 lb

## 2017-03-15 DIAGNOSIS — L989 Disorder of the skin and subcutaneous tissue, unspecified: Secondary | ICD-10-CM

## 2017-03-15 DIAGNOSIS — J4520 Mild intermittent asthma, uncomplicated: Secondary | ICD-10-CM | POA: Diagnosis not present

## 2017-03-15 DIAGNOSIS — I10 Essential (primary) hypertension: Secondary | ICD-10-CM

## 2017-03-15 DIAGNOSIS — E039 Hypothyroidism, unspecified: Secondary | ICD-10-CM | POA: Diagnosis not present

## 2017-03-15 DIAGNOSIS — M109 Gout, unspecified: Secondary | ICD-10-CM

## 2017-03-15 LAB — TSH: TSH: 2.09 u[IU]/mL (ref 0.35–4.50)

## 2017-03-15 MED ORDER — ALBUTEROL SULFATE HFA 108 (90 BASE) MCG/ACT IN AERS: 2.0000 | INHALATION_SPRAY | Freq: Four times a day (QID) | RESPIRATORY_TRACT | 2 refills | Status: DC | PRN

## 2017-03-15 NOTE — Telephone Encounter (Signed)
Staff message sent to Alexis [name] to check status of mobility order from November. Copied CMA on staff message also.  Awaiting response.

## 2017-03-15 NOTE — Patient Instructions (Addendum)
Your bp is well controlled today. No changes to med management for bp.  For asthma recently mild and intermittent will refill abluterol  For gout continue allopurinol  For low thyroid will get tsh today and see if dose of med treatment needed.  For skin lesion/rash left side bridge area will refer you to dermatologist.  Follow up date to be determined after lab review.

## 2017-03-15 NOTE — Telephone Encounter (Signed)
See 03/15/17 phone note.

## 2017-03-15 NOTE — Progress Notes (Signed)
Subjective:    Patient ID: Alexis [name], female    DOB: [birth year], 67 y.o.   MRN: [id number]  HPI  Pt feels good. No acute complaints.  Pt working out Monday, wed, and Friday doing water aerobics.  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  Sleeping better since started exercise.  She update me most recent a1c end of December was about 6.7. Last diabetic eye exam.  January 2018.  Pt refused mammogram though order placed. She will get dexascan.  Hx of gout and need refill of her allopurinol.  Bot needs refill of her albuterol inhaler. Uses it once every 2 weeks. Hx of asthma.  Hx of htn. bp initially high but on recheck was better.  Pt has hx of low thyroid. Recheck level today.  Left side of nose/skin. Dry flaky lesion. Present for 2 weeks.    Review of Systems  Constitutional: Negative for chills, diaphoresis, fatigue and unexpected weight change.  HENT: Negative for congestion, ear discharge and ear pain.   Respiratory: Negative for cough, choking, chest tightness, shortness of breath and wheezing.   Cardiovascular: Negative for chest pain and palpitations.  Gastrointestinal: Negative for abdominal pain, blood in stool, constipation, diarrhea and vomiting.  Genitourinary: Negative for dyspareunia, dysuria, flank pain and frequency.  Musculoskeletal: Negative for back pain, gait problem, joint swelling and neck stiffness.       Discussed last note ambulatory pain, difficulty and still needed to get mobility device. Still requeat and is in same condition. Needed to be wheeled in chair today to get in. And uses power chairs in stores due to difficutly ambulating.  Skin: Positive for rash.  Neurological: Negative for dizziness, facial asymmetry, light-headedness and headaches.       Phantom pain.  Hematological: Negative for adenopathy. Does not bruise/bleed easily.  Psychiatric/Behavioral: Negative for behavioral problems, confusion and  decreased concentration.    Past Medical History:  Diagnosis Date  . Allergy   . Anxiety   . Arthritis   . Asthma   . Atrial fibrillation ([place])   . CHF (congestive heart failure) ([place])   . COPD (chronic obstructive pulmonary disease) ([place])   . Diabetes mellitus without complication ([place])   . Hyperlipidemia   . Hypertension   . Myocardial infarction ([place])    several  . Pacemaker    CRT-P with RV lead and His Bundle lead ( high threshold)   . Peripheral neuropathy   . Pneumonia   . PONV (postoperative nausea and vomiting)    unsure of exactly what happened at [facility] in 2010 (knee surgery) that led to crital care  . Thyroid disease      Social History   Socioeconomic History  . Marital status: Married    Spouse name: Not on file  . Number of children: Not on file  . Years of education: Not on file  . Highest education level: Not on file  Social Needs  . Financial resource strain: Not on file  . Food insecurity - worry: Not on file  . Food insecurity - inability: Not on file  . Transportation needs - medical: Not on file  . Transportation needs - non-medical: Not on file  Occupational History  . Occupation: retired  Tobacco Use  . Smoking status: Former Smoker    Last attempt to quit: 03/01/1998    Years since quitting: 19.0  . Smokeless tobacco: Never Used  Substance and Sexual Activity  . 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  . Drug use: Yes    Types: Marijuana    Comment: remote use  . Sexual activity: No  Other Topics Concern  . Not on file  Social History Narrative  . Not on file    Past Surgical History:  Procedure Laterality Date  . ABDOMINAL AORTOGRAM W/LOWER EXTREMITY N/A 04/29/2016   Procedure: Abdominal Aortogram w/Lower Extremity;  Surgeon: Waynetta Sandy, MD;  Location: [place] [facility];  Service: Cardiovascular;  Laterality: N/A;  . ABDOMINAL  HYSTERECTOMY    . AMPUTATION Right 07/30/2016   Procedure: AMPUTATION RIGHT SECOND TOE;  Surgeon: Angelia Mould, MD;  Location: [place];  Service: Vascular;  Laterality: Right;  . CARDIAC CATHETERIZATION     2005 at [facility]  . PERIPHERAL VASCULAR ATHERECTOMY Right 04/29/2016   Procedure: Peripheral Vascular Atherectomy-Right Popliteal;  Surgeon: Waynetta Sandy, MD;  Location: [place] [facility];  Service: Cardiovascular;  Laterality: Right;  . PERIPHERAL VASCULAR INTERVENTION Right 04/29/2016   Procedure: Peripheral Vascular Intervention-Right Popliteal;  Surgeon: Waynetta Sandy, MD;  Location: [place] [facility];  Service: Cardiovascular;  Laterality: Right;  POPLITEAL PTA  . RADIOFREQUENCY ABLATION    . TOTAL KNEE ARTHROPLASTY    . TUBAL LIGATION      Family History  Problem Relation Age of Onset  . Diabetes Mother   . Hyperlipidemia Mother   . Diabetes Father   . Hyperlipidemia Father   . Heart disease Father        before age 71  . Heart attack Father   . Diabetes Brother   . Hyperlipidemia Brother   . Heart attack Brother     Allergies  Allergen Reactions  . Indomethacin Other (See Comments)     Renal Insufficiency  . Pregabalin Other (See Comments)    DIZZINESS   . Sulfa Antibiotics Hives  . Morphine And Related Nausea And Vomiting    Current Outpatient Medications on File Prior to Visit  Medication Sig Dispense Refill  . acetaminophen (TYLENOL) 500 MG tablet Take 1,000 mg by mouth daily as needed for moderate pain.    Marland Kitchen allopurinol (ZYLOPRIM) 100 MG tablet Take 1 tablet (100 mg total) by mouth daily. 90 tablet 2  . AMBULATORY NON FORMULARY MEDICATION Motorized scooter.  Diagnosis: Osteoarthritis M19.90 1 each 0  . Blood Glucose Monitoring Suppl (GLUCOCOM BLOOD GLUCOSE MONITOR) DEVI Use to check blood sugars 6 times daily E11.65    . DULoxetine (CYMBALTA) 30 MG capsule Take 1 capsule (30 mg total) by mouth at bedtime. 90 capsule 1    . ELIQUIS 5 MG TABS tablet TAKE ONE TABLET TWICE DAILY 180 tablet 1  . fluticasone (FLONASE) 50 MCG/ACT nasal spray Place 2 sprays into both nostrils at bedtime.    . fluticasone (FLOVENT HFA) 110 MCG/ACT inhaler Inhale 2 puffs into the lungs 2 (two) times daily as needed (bronchitis).    . Insulin Disposable Pump (V-GO 40) KIT Inject into the skin See admin instructions. Use with Novolog - 6 pumps before each meal    . Levothyroxine Sodium 150 MCG CAPS Take 1 capsule (150 mcg total) by mouth daily before breakfast. 30 capsule 3  . metoprolol succinate (TOPROL-XL) 25 MG 24 hr tablet Take 1 tablet (25 mg total) by mouth daily. 90 tablet 1  . NOVOLOG 100 UNIT/ML injection 100 units per day in VGO unknown  bolus    . OVER THE COUNTER MEDICATION Take 0.5 tablets by mouth at bedtime. THC Candy from [place]    . oxyCODONE-acetaminophen (PERCOCET/ROXICET) 5-325 MG tablet Take 1 tablet by mouth every 6 (six) hours as needed for severe pain. 16 tablet 0  . Prenatal Vit-Fe Fumarate-FA (MULTIVITAMIN-PRENATAL) 27-0.8 MG TABS tablet Take 1 tablet by mouth daily. Takes for nail and hair growth     . ramipril (ALTACE) 2.5 MG capsule Take 1 capsule (2.5 mg total) at bedtime by mouth. 90 capsule 1  . torsemide (DEMADEX) 10 MG tablet Take 1 tablet (10 mg total) by mouth daily. 90 tablet 1   No current facility-administered medications on file prior to visit.     BP 130/80   Pulse 72   Temp 97.8 F (36.6 C) (Oral)   Resp 16   Ht 5' 2"  (1.575 m)   Wt 226 lb (102.5 kg)   SpO2 97%   BMI 41.34 kg/m       Objective:   Physical Exam   General Mental Status- Alert. General Appearance- Not in acute distress.   Skin General: Color- Normal Color. Moisture- Normal Moisture. Left side nose/at bridge scally rash with surrounding redness. No vesicles seen.  Neck Carotid Arteries- Normal color. Moisture- Normal Moisture. No carotid bruits. No JVD.  Chest and Lung Exam Auscultation: Breath  Sounds:-Normal.  Cardiovascular Auscultation:Rythm- Regular. Murmurs & Other Heart Sounds:Auscultation of the heart reveals- No Murmurs.  Abdomen Inspection:-Inspeection Normal. Palpation/Percussion:Note:No mass. Palpation and Percussion of the abdomen reveal- Non Tender, Non Distended + BS, no rebound or guarding.    Neurologic Cranial Nerve exam:- CN III-XII intact(No nystagmus), symmetric smile. Strength:- 5/5 equal and symmetric strength both upper and lower extremities.     Assessment & Plan:  Your bp is well controlled today. No changes to med management for bp.  For asthma recently mild and intermittent will refill abluterol  For gout continue allopurinol  For low thyroid will get tsh today and see if dose of med treatment needed.  For skin lesion/rash left side bridge area will refer you to dermatologist.  Follow up date to be determined after lab review.  At the end patient brought up the fact that she was never [facility] regarding her mobility device/care/scooter.  Chart reviewed today and she states she is in exact condition as before.  No improvement in her mobility and  still would benefit from mobility device.  So I sent a message to my nurse asking her to contact [facility] so we could get process going regarding getting her mobility device/chair/scooter  Tishana Clinkenbeard, Percell Miller, PA-C

## 2017-03-15 NOTE — Telephone Encounter (Signed)
Will you call [facility]. I saw her in November and today. She still needs mobility device. What forms do we need to fill out.

## 2017-03-16 ENCOUNTER — Telehealth: Payer: Self-pay

## 2017-03-16 NOTE — Telephone Encounter (Signed)
-----   Message from Mackie Pai, PA-C sent at 03/15/2017  6:07 PM EST ----- Your thyroid level was in normal limit.  No change to your dosage.

## 2017-03-16 NOTE — Telephone Encounter (Signed)
Results given to patient as normal

## 2017-03-18 DIAGNOSIS — R69 Illness, unspecified: Secondary | ICD-10-CM | POA: Diagnosis not present

## 2017-04-07 ENCOUNTER — Ambulatory Visit (INDEPENDENT_AMBULATORY_CARE_PROVIDER_SITE_OTHER): Payer: Medicare HMO | Admitting: Medical

## 2017-04-07 ENCOUNTER — Encounter: Payer: Self-pay | Admitting: Medical

## 2017-04-07 VITALS — BP 142/64 | HR 72 | Temp 98.1°F | Resp 16 | Ht 62.0 in | Wt 222.2 lb

## 2017-04-07 DIAGNOSIS — R110 Nausea: Secondary | ICD-10-CM | POA: Diagnosis not present

## 2017-04-07 DIAGNOSIS — R197 Diarrhea, unspecified: Secondary | ICD-10-CM

## 2017-04-07 MED ORDER — ONDANSETRON 8 MG PO TBDP: 8.0000 mg | ORAL_TABLET | Freq: Three times a day (TID) | ORAL | 0 refills | Status: DC | PRN

## 2017-04-07 NOTE — Patient Instructions (Addendum)
For your recent diarrhea that has occurred intermittently mixed with some describe constipation, we will get CBC, CMP and one view abdomen x-ray.  If your diarrhea starts back up then recommend returning the stool panel kit.  Also if diarrhea starts back we will either advise Imodium over-the-counter or prescribed Lomotil.  Will evaluate x-ray first and see if diarrhea returns.  Also if diarrhea returns remember to eat bland diet and to keep hydrated with [employer] water.  For nausea, prescribe Zofran.  Keep an eye on your blood sugars and keep in contact with your endocrinologist for advisement.  Follow-up in 7 days or as needed.

## 2017-04-07 NOTE — Progress Notes (Signed)
Subjective:    Patient ID: Alexis [name], female    DOB: May 19, 1950, 67 y.o.   MRN: [id number]  HPI  Pt in for some recent loose, watery and yellow stools. Some nausea as well.This has happened since Friday evening and then recurrent again on Wednesday.  She state will have about 8 loose stools when they get started. Sunday through Tuesday states felt mild constipate. Small hard stools.  Today no loose stool.  Pt sugars have been high and she has been consulting with her endocrinologist. Sugars 198-325.  Pt does have known gallstones.   Pt states some chills recently.  Some myalgias but hx of fibromyalgia. Not describing flu like muscle aches.   Review of Systems  Constitutional: Negative for chills, fatigue and fever.  HENT: Negative for congestion, ear discharge, mouth sores, postnasal drip, rhinorrhea, sinus pressure and sinus pain.   Respiratory: Negative for cough, chest tightness, shortness of breath and wheezing.   Cardiovascular: Negative for chest pain and palpitations.  Gastrointestinal: Positive for diarrhea and nausea. Negative for abdominal pain, blood in stool, constipation and vomiting.       Some sensation of constipation also reported.  Genitourinary: Negative for dysuria, flank pain and frequency.  Musculoskeletal: Negative for back pain and neck pain.  Skin: Negative for rash.  Neurological: Negative for dizziness, speech difficulty, weakness, numbness and headaches.  Hematological: Negative for adenopathy. Does not bruise/bleed easily.  Psychiatric/Behavioral: Negative for behavioral problems, confusion and decreased concentration.   Past Medical History:  Diagnosis Date  . Allergy   . Anxiety   . Arthritis   . Asthma   . Atrial fibrillation ([place])   . CHF (congestive heart failure) ([place])   . COPD (chronic obstructive pulmonary disease) ([place])   . Diabetes mellitus without complication ([place])   . Hyperlipidemia   . Hypertension   . Myocardial  infarction ([place])    several  . Pacemaker    CRT-P with RV lead and His Bundle lead ( high threshold)   . Peripheral neuropathy   . Pneumonia   . PONV (postoperative nausea and vomiting)    unsure of exactly what happened at [facility] in 2010 (knee surgery) that led to crital care  . Thyroid disease      Social History   Socioeconomic History  . Marital status: Married    Spouse name: Not on file  . Number of children: Not on file  . Years of education: Not on file  . Highest education level: Not on file  Social Needs  . Financial resource strain: Not on file  . Food insecurity - worry: Not on file  . Food insecurity - inability: Not on file  . Transportation needs - medical: Not on file  . Transportation needs - non-medical: Not on file  Occupational History  . Occupation: retired  Tobacco Use  . Smoking status: Former Smoker    Last attempt to quit: 03/01/1998    Years since quitting: 19.1  . Smokeless tobacco: Never Used  Substance and Sexual Activity  . 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  . Drug use: Yes    Types: Marijuana    Comment: remote use  . Sexual activity: No  Other Topics Concern  . Not on file  Social History Narrative  . Not on file    Past Surgical History:  Procedure Laterality Date  . ABDOMINAL AORTOGRAM  W/LOWER EXTREMITY N/A 04/29/2016   Procedure: Abdominal Aortogram w/Lower Extremity;  Surgeon: Waynetta Sandy, MD;  Location: [place] [facility];  Service: Cardiovascular;  Laterality: N/A;  . ABDOMINAL HYSTERECTOMY    . AMPUTATION Right 07/30/2016   Procedure: AMPUTATION RIGHT SECOND TOE;  Surgeon: Angelia Mould, MD;  Location: [place];  Service: Vascular;  Laterality: Right;  . CARDIAC CATHETERIZATION     2005 at [facility]  . PERIPHERAL VASCULAR ATHERECTOMY Right 04/29/2016   Procedure: Peripheral Vascular Atherectomy-Right Popliteal;   Surgeon: Waynetta Sandy, MD;  Location: [place] [facility];  Service: Cardiovascular;  Laterality: Right;  . PERIPHERAL VASCULAR INTERVENTION Right 04/29/2016   Procedure: Peripheral Vascular Intervention-Right Popliteal;  Surgeon: Waynetta Sandy, MD;  Location: [place] [facility];  Service: Cardiovascular;  Laterality: Right;  POPLITEAL PTA  . RADIOFREQUENCY ABLATION    . TOTAL KNEE ARTHROPLASTY    . TUBAL LIGATION      Family History  Problem Relation Age of Onset  . Diabetes Mother   . Hyperlipidemia Mother   . Diabetes Father   . Hyperlipidemia Father   . Heart disease Father        before age 5  . Heart attack Father   . Diabetes Brother   . Hyperlipidemia Brother   . Heart attack Brother     Allergies  Allergen Reactions  . Indomethacin Other (See Comments)     Renal Insufficiency  . Pregabalin Other (See Comments)    DIZZINESS   . Sulfa Antibiotics Hives  . Morphine And Related Nausea And Vomiting    Current Outpatient Medications on File Prior to Visit  Medication Sig Dispense Refill  . acetaminophen (TYLENOL) 500 MG tablet Take 1,000 mg by mouth daily as needed for moderate pain.    Marland Kitchen albuterol (PROVENTIL HFA;VENTOLIN HFA) 108 (90 Base) MCG/ACT inhaler Inhale 2 puffs into the lungs every 6 (six) hours as needed for wheezing or shortness of breath. 1 Inhaler 2  . allopurinol (ZYLOPRIM) 100 MG tablet Take 1 tablet (100 mg total) by mouth daily. 90 tablet 2  . AMBULATORY NON FORMULARY MEDICATION Motorized scooter.  Diagnosis: Osteoarthritis M19.90 1 each 0  . Blood Glucose Monitoring Suppl (GLUCOCOM BLOOD GLUCOSE MONITOR) DEVI Use to check blood sugars 6 times daily E11.65    . DULoxetine (CYMBALTA) 30 MG capsule Take 1 capsule (30 mg total) by mouth at bedtime. 90 capsule 1  . ELIQUIS 5 MG TABS tablet TAKE ONE TABLET TWICE DAILY 180 tablet 1  . fluticasone (FLONASE) 50 MCG/ACT nasal spray Place 2 sprays into both nostrils at bedtime.    .  fluticasone (FLOVENT HFA) 110 MCG/ACT inhaler Inhale 2 puffs into the lungs 2 (two) times daily as needed (bronchitis).    . Insulin Disposable Pump (V-GO 40) KIT Inject into the skin See admin instructions. Use with Novolog - 6 pumps before each meal    . Levothyroxine Sodium 150 MCG CAPS Take 1 capsule (150 mcg total) by mouth daily before breakfast. 30 capsule 3  . metoprolol succinate (TOPROL-XL) 25 MG 24 hr tablet Take 1 tablet (25 mg total) by mouth daily. 90 tablet 1  . NOVOLOG 100 UNIT/ML injection 100 units per day in VGO unknown bolus    . OVER THE COUNTER MEDICATION Take 0.5 tablets by mouth at bedtime. THC Candy from [place]    . oxyCODONE-acetaminophen (PERCOCET/ROXICET) 5-325 MG tablet Take 1 tablet by mouth every 6 (six) hours as needed for severe pain.  16 tablet 0  . Prenatal Vit-Fe Fumarate-FA (MULTIVITAMIN-PRENATAL) 27-0.8 MG TABS tablet Take 1 tablet by mouth daily. Takes for nail and hair growth     . ramipril (ALTACE) 2.5 MG capsule Take 1 capsule (2.5 mg total) at bedtime by mouth. 90 capsule 1  . torsemide (DEMADEX) 10 MG tablet Take 1 tablet (10 mg total) by mouth daily. 90 tablet 1   No current facility-administered medications on file prior to visit.     BP (!) 142/64   Pulse 72   Temp 98.1 F (36.7 C) (Oral)   Resp 16   Ht 5' 2"  (1.575 m)   Wt 222 lb 3.2 oz (100.8 kg)   SpO2 99%   BMI 40.64 kg/m       Objective:   Physical Exam  General Appearance- Not in acute distress.  Skin- does not feel dry.  HEENT Eyes- Scleraeral/Conjuntiva-bilat- Not Yellow. Mouth & Throat- Normal.  Chest and Lung Exam Auscultation: Breath sounds:-Normal. Adventitious sounds:- No Adventitious sounds.  Cardiovascular Auscultation:Rythm - Regular. Heart Sounds -Normal heart sounds.  Abdomen Inspection:-Inspection Normal.  Palpation/Perucssion: Palpation and Percussion of the abdomen reveal- Non Tender, No Rebound tenderness, No rigidity(Guarding) and No Palpable  abdominal masses.  Liver:-Normal.  Spleen:- Normal.   Back- no cva tenderness.      Assessment & Plan:  For your recent diarrhea that has occurred intermittently mixed with some describe constipation, we will get CBC, CMP and one view abdomen x-ray.  If your diarrhea starts back up then recommend returning the stool panel kit.  Also if diarrhea starts back we will either advise Imodium over-the-counter or prescribed Lomotil.  Will evaluate x-ray first and see if diarrhea returns.  For nausea, prescribe Zofran.  Keep an eye on your blood sugars and keep in contact with your endocrinologist for advisement.  Follow-up in 7 days or as needed.  Mackie Pai, PA-C

## 2017-04-08 ENCOUNTER — Other Ambulatory Visit: Payer: Medicare HMO

## 2017-04-08 ENCOUNTER — Encounter: Payer: Self-pay | Admitting: Medical

## 2017-04-08 DIAGNOSIS — R197 Diarrhea, unspecified: Secondary | ICD-10-CM

## 2017-04-08 LAB — CBC WITH DIFFERENTIAL/PLATELET
BASOS ABS: 9 {cells}/uL (ref 0–200)
Basophils Relative: 0.2 %
Eosinophils Absolute: 60 cells/uL (ref 15–500)
Eosinophils Relative: 1.3 %
HCT: 40.1 % (ref 35.0–45.0)
HEMOGLOBIN: 14.2 g/dL (ref 11.7–15.5)
Lymphs Abs: 1214 cells/uL (ref 850–3900)
MCH: 32.5 pg (ref 27.0–33.0)
MCHC: 35.4 g/dL (ref 32.0–36.0)
MCV: 91.8 fL (ref 80.0–100.0)
MONOS PCT: 9.7 %
MPV: 12.9 fL — ABNORMAL HIGH (ref 7.5–12.5)
NEUTROS PCT: 62.4 %
Neutro Abs: 2870 cells/uL (ref 1500–7800)
PLATELETS: 69 10*3/uL — AB (ref 140–400)
RBC: 4.37 10*6/uL (ref 3.80–5.10)
RDW: 13.4 % (ref 11.0–15.0)
TOTAL LYMPHOCYTE: 26.4 %
WBC mixed population: 446 cells/uL (ref 200–950)
WBC: 4.6 10*3/uL (ref 3.8–10.8)

## 2017-04-08 LAB — COMPREHENSIVE METABOLIC PANEL
ALBUMIN: 4.1 g/dL (ref 3.5–5.2)
ALT: 41 U/L — AB (ref 0–35)
AST: 51 U/L — AB (ref 0–37)
Alkaline Phosphatase: 155 U/L — ABNORMAL HIGH (ref 39–117)
BILIRUBIN TOTAL: 0.7 mg/dL (ref 0.2–1.2)
BUN: 22 mg/dL (ref 6–23)
CALCIUM: 10.5 mg/dL (ref 8.4–10.5)
CO2: 31 meq/L (ref 19–32)
CREATININE: 0.75 mg/dL (ref 0.40–1.20)
Chloride: 99 mEq/L (ref 96–112)
GFR: 81.64 mL/min (ref >60.00)
Glucose, Bld: 124 mg/dL — ABNORMAL HIGH (ref 70–99)
Potassium: 4.2 mEq/L (ref 3.5–5.1)
Sodium: 136 mEq/L (ref 135–145)
Total Protein: 8.1 g/dL (ref 6.0–8.3)

## 2017-04-11 ENCOUNTER — Telehealth: Payer: Self-pay | Admitting: *Deleted

## 2017-04-11 NOTE — Telephone Encounter (Signed)
Patient called and left msg. on triage line. C/o" pain and leg cramps all night in left leg". Has Hx of blood clot in left leg. Called patient back, no answer and left msg to go to PCP, Urgent Care of ER for worsening symptoms for work up.

## 2017-04-13 DIAGNOSIS — H5200 Hypermetropia, unspecified eye: Secondary | ICD-10-CM | POA: Diagnosis not present

## 2017-04-13 LAB — HM DIABETES EYE EXAM

## 2017-04-14 ENCOUNTER — Encounter: Payer: Self-pay | Admitting: Medical

## 2017-04-14 ENCOUNTER — Ambulatory Visit: Payer: Medicare HMO | Admitting: Medical

## 2017-04-14 ENCOUNTER — Ambulatory Visit (HOSPITAL_BASED_OUTPATIENT_CLINIC_OR_DEPARTMENT_OTHER)
Admission: RE | Admit: 2017-04-14 | Discharge: 2017-04-14 | Disposition: A | Discharge status: Home / Self Care | Payer: Medicare HMO | Source: Ambulatory Visit | Attending: Medical | Admitting: Medical

## 2017-04-14 ENCOUNTER — Ambulatory Visit (INDEPENDENT_AMBULATORY_CARE_PROVIDER_SITE_OTHER): Payer: Medicare HMO | Admitting: Medical

## 2017-04-14 VITALS — BP 152/59 | HR 73 | Resp 16 | Ht 62.0 in | Wt 223.8 lb

## 2017-04-14 DIAGNOSIS — E1059 Type 1 diabetes mellitus with other circulatory complications: Secondary | ICD-10-CM

## 2017-04-14 DIAGNOSIS — M25572 Pain in left ankle and joints of left foot: Secondary | ICD-10-CM | POA: Diagnosis not present

## 2017-04-14 DIAGNOSIS — I739 Peripheral vascular disease, unspecified: Secondary | ICD-10-CM | POA: Diagnosis not present

## 2017-04-14 DIAGNOSIS — L609 Nail disorder, unspecified: Secondary | ICD-10-CM

## 2017-04-14 DIAGNOSIS — M79662 Pain in left lower leg: Secondary | ICD-10-CM

## 2017-04-14 DIAGNOSIS — M79605 Pain in left leg: Secondary | ICD-10-CM | POA: Diagnosis not present

## 2017-04-14 DIAGNOSIS — R197 Diarrhea, unspecified: Secondary | ICD-10-CM | POA: Insufficient documentation

## 2017-04-14 DIAGNOSIS — E119 Type 2 diabetes mellitus without complications: Secondary | ICD-10-CM

## 2017-04-14 DIAGNOSIS — R252 Cramp and spasm: Secondary | ICD-10-CM | POA: Diagnosis not present

## 2017-04-14 DIAGNOSIS — K5900 Constipation, unspecified: Secondary | ICD-10-CM | POA: Diagnosis not present

## 2017-04-14 LAB — COMPREHENSIVE METABOLIC PANEL
ALK PHOS: 179 U/L — AB (ref 39–117)
ALT: 45 U/L — AB (ref 0–35)
AST: 55 U/L — ABNORMAL HIGH (ref 0–37)
Albumin: 4.2 g/dL (ref 3.5–5.2)
BILIRUBIN TOTAL: 0.6 mg/dL (ref 0.2–1.2)
BUN: 28 mg/dL — AB (ref 6–23)
CO2: 32 mEq/L (ref 19–32)
CREATININE: 0.93 mg/dL (ref 0.40–1.20)
Calcium: 10.1 mg/dL (ref 8.4–10.5)
Chloride: 95 mEq/L — ABNORMAL LOW (ref 96–112)
GFR: 63.69 mL/min (ref >60.00)
GLUCOSE: 233 mg/dL — AB (ref 70–99)
Potassium: 3.6 mEq/L (ref 3.5–5.1)
SODIUM: 137 meq/L (ref 135–145)
Total Protein: 8.1 g/dL (ref 6.0–8.3)

## 2017-04-14 LAB — MAGNESIUM: Magnesium: 1.4 mg/dL — ABNORMAL LOW (ref 1.5–2.5)

## 2017-04-14 NOTE — Patient Instructions (Signed)
For left calf pain, will get lower ext [place] today.  For left ankle pain will get xray.  For loose toenail will refer you to podiatrist to evaluate and likely remove the loose nail.  For cramping of ext will get cmp and mg level.  Follow up date to be determined after reviewing lab and imaging studies.

## 2017-04-14 NOTE — Addendum Note (Signed)
Addended by: Anabel Halon on: 04/14/2017 05:06 PM   Modules accepted: Orders

## 2017-04-14 NOTE — Progress Notes (Addendum)
Subjective:    Patient ID: Alexis [name], female    DOB: 06/25/1950, 67 y.o.   MRN: [id number]  HPI  Pt in with some left ankle area pain last Friday night. She states felt like a cramp in ankle. Pt states she still has cramping sensation to her ankle. Pt also has some calf pain since yesterday.   Also her left great toe nail is loose with bruised appearance in middlle of great toe nail. Nail is loose and painful.   Review of Systems  Constitutional: Negative for chills, fatigue and fever.  Respiratory: Negative for cough, chest tightness, shortness of breath and wheezing.   Cardiovascular: Negative for chest pain and palpitations.  Gastrointestinal: Negative for abdominal distention, abdominal pain, blood in stool, constipation, diarrhea, nausea and vomiting.  Musculoskeletal:       Calf pain, left ankle pain. Loose toenail left side.  Skin: Negative for rash.  Neurological: Negative for dizziness, light-headedness and numbness.  Hematological: Negative for adenopathy. Does not bruise/bleed easily.  Psychiatric/Behavioral: Negative for behavioral problems, sleep disturbance and suicidal ideas. The patient is not nervous/anxious.     Past Medical History:  Diagnosis Date  . Allergy   . Anxiety   . Arthritis   . Asthma   . Atrial fibrillation ([place])   . CHF (congestive heart failure) ([place])   . COPD (chronic obstructive pulmonary disease) ([place])   . Diabetes mellitus without complication ([place])   . Hyperlipidemia   . Hypertension   . Myocardial infarction ([place])    several  . Pacemaker    CRT-P with RV lead and His Bundle lead ( high threshold)   . Peripheral neuropathy   . Pneumonia   . PONV (postoperative nausea and vomiting)    unsure of exactly what happened at [facility] in 2010 (knee surgery) that led to crital care  . Thyroid disease      Social History   Socioeconomic History  . Marital status: Married    Spouse name: Not on file  . Number of  children: Not on file  . Years of education: Not on file  . Highest education level: Not on file  Social Needs  . Financial resource strain: Not on file  . Food insecurity - worry: Not on file  . Food insecurity - inability: Not on file  . Transportation needs - medical: Not on file  . Transportation needs - non-medical: Not on file  Occupational History  . Occupation: retired  Tobacco Use  . Smoking status: Former Smoker    Last attempt to quit: 03/01/1998    Years since quitting: 19.1  . Smokeless tobacco: Never Used  Substance and Sexual Activity  . 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  . Drug use: Yes    Types: Marijuana    Comment: remote use  . Sexual activity: No  Other Topics Concern  . Not on file  Social History Narrative  . Not on file    Past Surgical History:  Procedure Laterality Date  . ABDOMINAL AORTOGRAM W/LOWER EXTREMITY N/A 04/29/2016   Procedure: Abdominal Aortogram w/Lower Extremity;  Surgeon: Waynetta Sandy, MD;  Location: [place] [facility];  Service: Cardiovascular;  Laterality: N/A;  . ABDOMINAL HYSTERECTOMY    . AMPUTATION Right 07/30/2016   Procedure: AMPUTATION RIGHT SECOND TOE;  Surgeon: Angelia Mould, MD;  Location: [place];  Service: Vascular;  Laterality: Right;  . CARDIAC CATHETERIZATION     2005 at [facility]  . PERIPHERAL VASCULAR ATHERECTOMY Right 04/29/2016   Procedure: Peripheral Vascular Atherectomy-Right Popliteal;  Surgeon: Waynetta Sandy, MD;  Location: [place] [facility];  Service: Cardiovascular;  Laterality: Right;  . PERIPHERAL VASCULAR INTERVENTION Right 04/29/2016   Procedure: Peripheral Vascular Intervention-Right Popliteal;  Surgeon: Waynetta Sandy, MD;  Location: [place] [facility];  Service: Cardiovascular;  Laterality: Right;  POPLITEAL PTA  . RADIOFREQUENCY ABLATION    . TOTAL KNEE ARTHROPLASTY    . TUBAL LIGATION       Family History  Problem Relation Age of Onset  . Diabetes Mother   . Hyperlipidemia Mother   . Diabetes Father   . Hyperlipidemia Father   . Heart disease Father        before age 57  . Heart attack Father   . Diabetes Brother   . Hyperlipidemia Brother   . Heart attack Brother     Allergies  Allergen Reactions  . Indomethacin Other (See Comments)     Renal Insufficiency  . Pregabalin Other (See Comments)    DIZZINESS   . Sulfa Antibiotics Hives  . Morphine And Related Nausea And Vomiting    Current Outpatient Medications on File Prior to Visit  Medication Sig Dispense Refill  . acetaminophen (TYLENOL) 500 MG tablet Take 1,000 mg by mouth daily as needed for moderate pain.    Marland Kitchen albuterol (PROVENTIL HFA;VENTOLIN HFA) 108 (90 Base) MCG/ACT inhaler Inhale 2 puffs into the lungs every 6 (six) hours as needed for wheezing or shortness of breath. 1 Inhaler 2  . allopurinol (ZYLOPRIM) 100 MG tablet Take 1 tablet (100 mg total) by mouth daily. 90 tablet 2  . AMBULATORY NON FORMULARY MEDICATION Motorized scooter.  Diagnosis: Osteoarthritis M19.90 1 each 0  . Blood Glucose Monitoring Suppl (GLUCOCOM BLOOD GLUCOSE MONITOR) DEVI Use to check blood sugars 6 times daily E11.65    . DULoxetine (CYMBALTA) 30 MG capsule Take 1 capsule (30 mg total) by mouth at bedtime. 90 capsule 1  . ELIQUIS 5 MG TABS tablet TAKE ONE TABLET TWICE DAILY 180 tablet 1  . fluticasone (FLONASE) 50 MCG/ACT nasal spray Place 2 sprays into both nostrils at bedtime.    . fluticasone (FLOVENT HFA) 110 MCG/ACT inhaler Inhale 2 puffs into the lungs 2 (two) times daily as needed (bronchitis).    . Insulin Disposable Pump (V-GO 40) KIT Inject into the skin See admin instructions. Use with Novolog - 6 pumps before each meal    . Levothyroxine Sodium 150 MCG CAPS Take 1 capsule (150 mcg total) by mouth daily before breakfast. 30 capsule 3  . metoprolol succinate (TOPROL-XL) 25 MG 24 hr tablet Take 1 tablet (25 mg  total) by mouth daily. 90 tablet 1  . NOVOLOG 100 UNIT/ML injection 100 units per day in VGO unknown bolus    . ondansetron (ZOFRAN ODT) 8 MG disintegrating tablet Take 1 tablet (8 mg total) by mouth every 8 (eight) hours as needed for nausea or vomiting. 20 tablet 0  . OVER THE COUNTER MEDICATION Take 0.5 tablets by mouth at bedtime. THC Candy from [place]    . oxyCODONE-acetaminophen (PERCOCET/ROXICET) 5-325 MG tablet Take 1 tablet by mouth every 6 (six) hours as needed for severe pain. 16 tablet 0  . Prenatal Vit-Fe Fumarate-FA (MULTIVITAMIN-PRENATAL) 27-0.8 MG TABS tablet Take 1 tablet by mouth daily. Takes for nail and hair growth     . ramipril (  ALTACE) 2.5 MG capsule Take 1 capsule (2.5 mg total) at bedtime by mouth. 90 capsule 1  . torsemide (DEMADEX) 10 MG tablet Take 1 tablet (10 mg total) by mouth daily. 90 tablet 1   No current facility-administered medications on file prior to visit.     BP (!) 152/59 (BP Location: Right Arm, Patient Position: Sitting, Cuff Size: Large)   Pulse 73   Resp 16   Ht 5' 2"  (1.575 m)   Wt 223 lb 12.8 oz (101.5 kg)   SpO2 96%   BMI 40.93 kg/m       Objective:   Physical Exam  General- No acute distress. Pleasant patient. Neck- Full range of motion, no jvd Lungs- Clear, even and unlabored. Heart- regular rate and rhythm. Neurologic- CNII- XII grossly intact.  Left lower ext- calf is mild tender on palpation. Negative homan sign. Some decreased pulse sensation.  Feet do feel a little cold.  Decreased capillary refill.  But the toe and foot do not have necrotic appearance.  More pinkish color presently.  Left ankle- no direct tenderness to palpation. Left foot- great toes has loose nail. Thick discolored nail with bruising under the nail      Assessment & Plan:  For left calf pain, will get lower ext [place] today.  For left ankle pain will get xray.  For loose toenail will refer you to podiatrist to evaluate and likely remove the loose  nail.  For cramping of ext will get cmp and mg level.  Follow up date to be determined after reviewing lab and imaging studies.   I talked with our radiology staff later this evening as we were trying to get the ultrasound done.  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  She does note cracking sensation at times.  She has known poor vascular flow on both sides in the past.  Since this was brought to my attention I decided to go ahead and try to get CT angiography with contrast done at that left extremity tomorrow morning.  Asking staff to try to get that authorized tonight.  If patient has any worsening signs and symptoms of the left lower extremity tonight and then advised to go to the emergency department to do this test without prior authorization.  Presently I have asked the radiology tech to go ahead and do the left lower extremity Doppler to evaluate venous system.  Mackie Pai, PA-C

## 2017-04-15 ENCOUNTER — Ambulatory Visit (HOSPITAL_BASED_OUTPATIENT_CLINIC_OR_DEPARTMENT_OTHER)
Admission: RE | Admit: 2017-04-15 | Discharge: 2017-04-15 | Disposition: A | Discharge status: Home / Self Care | Payer: Medicare HMO | Source: Ambulatory Visit | Attending: Medical | Admitting: Medical

## 2017-04-15 ENCOUNTER — Other Ambulatory Visit: Payer: Self-pay | Admitting: Medical

## 2017-04-15 ENCOUNTER — Ambulatory Visit (HOSPITAL_BASED_OUTPATIENT_CLINIC_OR_DEPARTMENT_OTHER)
Admission: RE | Admit: 2017-04-15 | Discharge: 2017-04-15 | Disposition: A | Discharge status: Home / Self Care | Payer: Medicare HMO | Source: Ambulatory Visit

## 2017-04-15 ENCOUNTER — Other Ambulatory Visit: Payer: Medicare HMO

## 2017-04-15 ENCOUNTER — Other Ambulatory Visit: Payer: Self-pay

## 2017-04-15 DIAGNOSIS — I700 Atherosclerosis of aorta: Secondary | ICD-10-CM | POA: Diagnosis not present

## 2017-04-15 DIAGNOSIS — R768 Other specified abnormal immunological findings in serum: Secondary | ICD-10-CM | POA: Diagnosis not present

## 2017-04-15 DIAGNOSIS — K5730 Diverticulosis of large intestine without perforation or abscess without bleeding: Secondary | ICD-10-CM | POA: Diagnosis not present

## 2017-04-15 DIAGNOSIS — G629 Polyneuropathy, unspecified: Secondary | ICD-10-CM | POA: Diagnosis not present

## 2017-04-15 DIAGNOSIS — M85851 Other specified disorders of bone density and structure, right thigh: Secondary | ICD-10-CM | POA: Diagnosis not present

## 2017-04-15 DIAGNOSIS — K7460 Unspecified cirrhosis of liver: Secondary | ICD-10-CM | POA: Insufficient documentation

## 2017-04-15 DIAGNOSIS — I739 Peripheral vascular disease, unspecified: Secondary | ICD-10-CM

## 2017-04-15 DIAGNOSIS — I701 Atherosclerosis of renal artery: Secondary | ICD-10-CM | POA: Insufficient documentation

## 2017-04-15 DIAGNOSIS — M150 Primary generalized (osteo)arthritis: Secondary | ICD-10-CM | POA: Diagnosis not present

## 2017-04-15 DIAGNOSIS — M2550 Pain in unspecified joint: Secondary | ICD-10-CM | POA: Diagnosis not present

## 2017-04-15 DIAGNOSIS — M47816 Spondylosis without myelopathy or radiculopathy, lumbar region: Secondary | ICD-10-CM | POA: Insufficient documentation

## 2017-04-15 DIAGNOSIS — M109 Gout, unspecified: Secondary | ICD-10-CM | POA: Diagnosis not present

## 2017-04-15 DIAGNOSIS — Z794 Long term (current) use of insulin: Secondary | ICD-10-CM | POA: Diagnosis not present

## 2017-04-15 DIAGNOSIS — L609 Nail disorder, unspecified: Secondary | ICD-10-CM

## 2017-04-15 DIAGNOSIS — R252 Cramp and spasm: Secondary | ICD-10-CM

## 2017-04-15 DIAGNOSIS — K8020 Calculus of gallbladder without cholecystitis without obstruction: Secondary | ICD-10-CM | POA: Insufficient documentation

## 2017-04-15 DIAGNOSIS — E119 Type 2 diabetes mellitus without complications: Secondary | ICD-10-CM | POA: Diagnosis not present

## 2017-04-15 DIAGNOSIS — I70203 Unspecified atherosclerosis of native arteries of extremities, bilateral legs: Secondary | ICD-10-CM | POA: Diagnosis not present

## 2017-04-15 DIAGNOSIS — M5030 Other cervical disc degeneration, unspecified cervical region: Secondary | ICD-10-CM | POA: Diagnosis not present

## 2017-04-15 DIAGNOSIS — Z6841 Body Mass Index (BMI) 40.0 and over, adult: Secondary | ICD-10-CM | POA: Diagnosis not present

## 2017-04-15 DIAGNOSIS — Z780 Asymptomatic menopausal state: Secondary | ICD-10-CM | POA: Insufficient documentation

## 2017-04-15 DIAGNOSIS — I7090 Unspecified atherosclerosis: Secondary | ICD-10-CM | POA: Diagnosis not present

## 2017-04-15 MED ORDER — IOPAMIDOL (ISOVUE-370) INJECTION 76%
125.0000 mL | Freq: Once | INTRAVENOUS | Status: AC | PRN
  Administered 2017-04-15: 125 mL via INTRAVENOUS

## 2017-04-16 ENCOUNTER — Telehealth: Payer: Self-pay | Admitting: Medical

## 2017-04-16 DIAGNOSIS — M85859 Other specified disorders of bone density and structure, unspecified thigh: Secondary | ICD-10-CM

## 2017-04-16 MED ORDER — ALENDRONATE SODIUM 10 MG PO TABS: 10.0000 mg | ORAL_TABLET | Freq: Every day | ORAL | 11 refills | Status: DC

## 2017-04-16 NOTE — Telephone Encounter (Signed)
Future vitamin D placed.  Rx Fosamax at the pharmacy

## 2017-04-18 ENCOUNTER — Encounter: Payer: Self-pay | Admitting: Medical

## 2017-04-18 ENCOUNTER — Ambulatory Visit (INDEPENDENT_AMBULATORY_CARE_PROVIDER_SITE_OTHER): Payer: Medicare HMO | Admitting: Medical

## 2017-04-18 ENCOUNTER — Ambulatory Visit (HOSPITAL_BASED_OUTPATIENT_CLINIC_OR_DEPARTMENT_OTHER): Payer: Medicare HMO

## 2017-04-18 VITALS — BP 131/84 | HR 88 | Temp 99.0°F | Resp 16

## 2017-04-18 DIAGNOSIS — M85859 Other specified disorders of bone density and structure, unspecified thigh: Secondary | ICD-10-CM

## 2017-04-18 DIAGNOSIS — I739 Peripheral vascular disease, unspecified: Secondary | ICD-10-CM

## 2017-04-18 DIAGNOSIS — M79662 Pain in left lower leg: Secondary | ICD-10-CM

## 2017-04-18 DIAGNOSIS — M79672 Pain in left foot: Secondary | ICD-10-CM | POA: Diagnosis not present

## 2017-04-18 DIAGNOSIS — I70209 Unspecified atherosclerosis of native arteries of extremities, unspecified extremity: Secondary | ICD-10-CM | POA: Diagnosis not present

## 2017-04-18 LAB — VITAMIN D 25 HYDROXY (VIT D DEFICIENCY, FRACTURES): VITD: 25.59 ng/mL — AB (ref 30.00–100.00)

## 2017-04-18 NOTE — Patient Instructions (Signed)
For your left calf, foot pain, and finding occlusion of sfa finding will refer you to Dr. Chapman Fitch surgeon. Will try to see if they will see you this week.  Would like to get you in with vascular surgeon before you see podiatrist for loose great toenail.  If you have dramatic change in color of foot or ulcers/lesions and vascular appointment delayed then be seen in main ED at [facility].  Follow up in 7 days or as needed

## 2017-04-18 NOTE — Progress Notes (Signed)
Subjective:    Patient ID: Alexis [name], female    DOB: 1950-12-01, 67 y.o.   MRN: [id number]  HPI   Pt in for follow up. The color of her left foot looks good. No lesion or breakdown. Pt still has some calf pain and cramping when she walks. Pt [place] was negative. Her CT angio on Friday showed.   Multilevel peripheral vascular disease, including:  - Short-segment left SFA occlusion at the adductor canal, with reconstitution of the below knee popliteal artery.  -mild aortic and aortoiliac disease with no significant stenosis. Aortic Atherosclerosis (ICD10-I70.0).  -bilateral mild a moderate femoropopliteal disease, with evidence of developing stenoses bilaterally in addition to the left adductor canal occlusion.  -bilateral tibial disease, including right anterior tibial artery occlusion, left posterior tibial artery occlusion, and patency of at least 1 tibial vessel to the ankle on both the left and the right.  -bilateral renal artery disease, likely worst on the left where there may be 50% stenosis at the renal artery origin.  Diverticular disease with questionable early inflammatory changes of the sigmoid colon. If there is concern for acute diverticulitis, recommend correlation with lab values and patient presentation.  Cirrhosis.  Cholelithiasis without evidence of acute cholecystitis.  Multilevel degenerative changes of the lumbar spine without significant bony canal narrowing.   Review of Systems  Constitutional: Negative for chills, fatigue and fever.  Respiratory: Negative for cough, chest tightness, shortness of breath and wheezing.   Cardiovascular: Negative for chest pain and palpitations.  Musculoskeletal:       See physical exam.  Neurological: Negative for dizziness, speech difficulty, weakness, numbness and headaches.  Hematological: Negative for adenopathy. Does not bruise/bleed easily.  Psychiatric/Behavioral: Negative for behavioral  problems and confusion.    Past Medical History:  Diagnosis Date  . Allergy   . Anxiety   . Arthritis   . Asthma   . Atrial fibrillation ([place])   . CHF (congestive heart failure) ([place])   . COPD (chronic obstructive pulmonary disease) ([place])   . Diabetes mellitus without complication ([place])   . Hyperlipidemia   . Hypertension   . Myocardial infarction ([place])    several  . Pacemaker    CRT-P with RV lead and His Bundle lead ( high threshold)   . Peripheral neuropathy   . Pneumonia   . PONV (postoperative nausea and vomiting)    unsure of exactly what happened at [facility] in 2010 (knee surgery) that led to crital care  . Thyroid disease      Social History   Socioeconomic History  . Marital status: Married    Spouse name: Not on file  . Number of children: Not on file  . Years of education: Not on file  . Highest education level: Not on file  Social Needs  . Financial resource strain: Not on file  . Food insecurity - worry: Not on file  . Food insecurity - inability: Not on file  . Transportation needs - medical: Not on file  . Transportation needs - non-medical: Not on file  Occupational History  . Occupation: retired  Tobacco Use  . Smoking status: Former Smoker    Last attempt to quit: 03/01/1998    Years since quitting: 19.1  . Smokeless tobacco: Never Used  Substance and Sexual Activity  . 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  . Drug use: Yes  Types: Marijuana    Comment: remote use  . Sexual activity: No  Other Topics Concern  . Not on file  Social History Narrative  . Not on file    Past Surgical History:  Procedure Laterality Date  . ABDOMINAL AORTOGRAM W/LOWER EXTREMITY N/A 04/29/2016   Procedure: Abdominal Aortogram w/Lower Extremity;  Surgeon: Waynetta Sandy, MD;  Location: [place] [facility];  Service: Cardiovascular;  Laterality: N/A;  . ABDOMINAL  HYSTERECTOMY    . AMPUTATION Right 07/30/2016   Procedure: AMPUTATION RIGHT SECOND TOE;  Surgeon: Angelia Mould, MD;  Location: [place];  Service: Vascular;  Laterality: Right;  . CARDIAC CATHETERIZATION     2005 at [facility]  . PERIPHERAL VASCULAR ATHERECTOMY Right 04/29/2016   Procedure: Peripheral Vascular Atherectomy-Right Popliteal;  Surgeon: Waynetta Sandy, MD;  Location: [place] [facility];  Service: Cardiovascular;  Laterality: Right;  . PERIPHERAL VASCULAR INTERVENTION Right 04/29/2016   Procedure: Peripheral Vascular Intervention-Right Popliteal;  Surgeon: Waynetta Sandy, MD;  Location: [place] [facility];  Service: Cardiovascular;  Laterality: Right;  POPLITEAL PTA  . RADIOFREQUENCY ABLATION    . TOTAL KNEE ARTHROPLASTY    . TUBAL LIGATION      Family History  Problem Relation Age of Onset  . Diabetes Mother   . Hyperlipidemia Mother   . Diabetes Father   . Hyperlipidemia Father   . Heart disease Father        before age 84  . Heart attack Father   . Diabetes Brother   . Hyperlipidemia Brother   . Heart attack Brother     Allergies  Allergen Reactions  . Indomethacin Other (See Comments)     Renal Insufficiency  . Pregabalin Other (See Comments)    DIZZINESS   . Sulfa Antibiotics Hives  . Morphine And Related Nausea And Vomiting    Current Outpatient Medications on File Prior to Visit  Medication Sig Dispense Refill  . acetaminophen (TYLENOL) 500 MG tablet Take 1,000 mg by mouth daily as needed for moderate pain.    Marland Kitchen albuterol (PROVENTIL HFA;VENTOLIN HFA) 108 (90 Base) MCG/ACT inhaler Inhale 2 puffs into the lungs every 6 (six) hours as needed for wheezing or shortness of breath. 1 Inhaler 2  . alendronate (FOSAMAX) 10 MG tablet Take 1 tablet (10 mg total) by mouth daily before breakfast. Take with a full glass of water on an empty stomach. 30 tablet 11  . allopurinol (ZYLOPRIM) 100 MG tablet Take 1 tablet (100 mg total) by mouth  daily. 90 tablet 2  . AMBULATORY NON FORMULARY MEDICATION Motorized scooter.  Diagnosis: Osteoarthritis M19.90 1 each 0  . Blood Glucose Monitoring Suppl (GLUCOCOM BLOOD GLUCOSE MONITOR) DEVI Use to check blood sugars 6 times daily E11.65    . DULoxetine (CYMBALTA) 30 MG capsule Take 1 capsule (30 mg total) by mouth at bedtime. 90 capsule 1  . ELIQUIS 5 MG TABS tablet TAKE ONE TABLET TWICE DAILY 180 tablet 1  . fluticasone (FLONASE) 50 MCG/ACT nasal spray Place 2 sprays into both nostrils at bedtime.    . fluticasone (FLOVENT HFA) 110 MCG/ACT inhaler Inhale 2 puffs into the lungs 2 (two) times daily as needed (bronchitis).    . Insulin Disposable Pump (V-GO 40) KIT Inject into the skin See admin instructions. Use with Novolog - 6 pumps before each meal    . Levothyroxine Sodium 150 MCG CAPS Take 1 capsule (150 mcg total) by mouth daily before breakfast. 30 capsule 3  .  metoprolol succinate (TOPROL-XL) 25 MG 24 hr tablet Take 1 tablet (25 mg total) by mouth daily. 90 tablet 1  . NOVOLOG 100 UNIT/ML injection 100 units per day in VGO unknown bolus    . ondansetron (ZOFRAN ODT) 8 MG disintegrating tablet Take 1 tablet (8 mg total) by mouth every 8 (eight) hours as needed for nausea or vomiting. 20 tablet 0  . OVER THE COUNTER MEDICATION Take 0.5 tablets by mouth at bedtime. THC Candy from [place]    . oxyCODONE-acetaminophen (PERCOCET/ROXICET) 5-325 MG tablet Take 1 tablet by mouth every 6 (six) hours as needed for severe pain. 16 tablet 0  . Prenatal Vit-Fe Fumarate-FA (MULTIVITAMIN-PRENATAL) 27-0.8 MG TABS tablet Take 1 tablet by mouth daily. Takes for nail and hair growth     . ramipril (ALTACE) 2.5 MG capsule Take 1 capsule (2.5 mg total) at bedtime by mouth. 90 capsule 1  . torsemide (DEMADEX) 10 MG tablet Take 1 tablet (10 mg total) by mouth daily. 90 tablet 1   No current facility-administered medications on file prior to visit.     BP 131/84   Pulse 88   Temp 99 F (37.2 C) (Oral)    Resp 16   SpO2 95%       Objective:   Physical Exam   General- No acute distress. Pleasant patient. Neck- Full range of motion, no jvd Lungs- Clear, even and unlabored. Heart- regular rate and rhythm. Neurologic- CNII- XII grossly intact.  Left lower ext- calfs area symmetric. No swelling. Mild pain on palpatoin. Left foot- good color to toes presently. Good capillary refill. No lesions on foot. Rt great toenail mild loose.       Assessment & Plan:  For your left calf, foot pain, and finding occlusion of sfa finding will refer you to Dr. Chapman Fitch surgeon. Will try to see if they will see you this week.  Would like to get you in with vascular surgeon before you see podiatrist for loose great toenail.  If you have dramatic change in color of foot or ulcers/lesions and vascular appointment delayed then be seen in main ED at [facility].  Follow up in 7 days or as needed  [employer], [employer]

## 2017-04-19 ENCOUNTER — Telehealth: Payer: Self-pay | Admitting: Medical

## 2017-04-19 ENCOUNTER — Other Ambulatory Visit: Payer: Self-pay

## 2017-04-19 DIAGNOSIS — M79606 Pain in leg, unspecified: Secondary | ICD-10-CM

## 2017-04-19 MED ORDER — VITAMIN D (ERGOCALCIFEROL) 1.25 MG (50000 UNIT) PO CAPS: 50000.0000 [IU] | ORAL_CAPSULE | ORAL | 0 refills | Status: DC

## 2017-04-19 NOTE — Telephone Encounter (Signed)
Prescription of vitamin D sent to patient's pharmacy.  Notify patient.

## 2017-04-20 ENCOUNTER — Ambulatory Visit (HOSPITAL_COMMUNITY)
Admission: RE | Admit: 2017-04-20 | Discharge: 2017-04-20 | Disposition: A | Discharge status: Home / Self Care | Payer: Medicare HMO | Source: Ambulatory Visit | Attending: Vascular Surgery | Admitting: Vascular Surgery

## 2017-04-20 ENCOUNTER — Other Ambulatory Visit: Payer: Self-pay | Admitting: *Deleted

## 2017-04-20 ENCOUNTER — Encounter: Payer: Self-pay | Admitting: *Deleted

## 2017-04-20 ENCOUNTER — Encounter: Payer: Self-pay | Admitting: Medical

## 2017-04-20 ENCOUNTER — Ambulatory Visit (INDEPENDENT_AMBULATORY_CARE_PROVIDER_SITE_OTHER): Payer: Medicare HMO | Admitting: Vascular Surgery

## 2017-04-20 ENCOUNTER — Encounter: Payer: Self-pay | Admitting: Vascular Surgery

## 2017-04-20 VITALS — BP 156/72 | HR 70 | Temp 98.3°F | Resp 18 | Ht 62.0 in | Wt 231.0 lb

## 2017-04-20 DIAGNOSIS — M79605 Pain in left leg: Secondary | ICD-10-CM | POA: Insufficient documentation

## 2017-04-20 DIAGNOSIS — M79606 Pain in leg, unspecified: Secondary | ICD-10-CM | POA: Diagnosis not present

## 2017-04-20 DIAGNOSIS — M79604 Pain in right leg: Secondary | ICD-10-CM | POA: Insufficient documentation

## 2017-04-20 DIAGNOSIS — I739 Peripheral vascular disease, unspecified: Secondary | ICD-10-CM | POA: Diagnosis not present

## 2017-04-20 DIAGNOSIS — E1151 Type 2 diabetes mellitus with diabetic peripheral angiopathy without gangrene: Secondary | ICD-10-CM

## 2017-04-20 NOTE — H&P (View-Only) (Signed)
 Patient name: Alexis [name] MRN: [id number] DOB: 01/14/1951 Sex: female  REASON FOR VISIT:   Follow-up of peripheral vascular disease.  HPI:   Alexis [name] is a pleasant 67 y.o. female who I last saw on 11/03/2016.  The patient had presented with osteomyelitis of her right second toe.  She underwent endovascular revascularization by Dr. Cain with an excellent result.  She had single-vessel runoff on the right via the peroneal artery.  The toe amputation site has healed when I saw her last.  She had biphasic Doppler signals in the right foot with an ABI of 100% and a toe pressure of 94.  On the left side she had biphasic Doppler signals with an ABI of 97% and a toe pressure of 76 mmHg.  He comes in for a 6-month follow-up visit.  Since I saw her last her right leg is well.  She denies any symptoms on the right.  The toe amputation site is healed.  However, on the left leg she developed left calf claudication at a short distance.  This has gradually progressed.  Her symptoms are brought on by ambulation and relieved with rest.  There are no other aggravating or alleviating factors.  She is also developed some rest pain in the left foot.  She denies any nonhealing ulcers.  She does have a toenail on the left great toe which is scheduled to be operated on.  Past Medical History:  Diagnosis Date  . Allergy   . Anxiety   . Arthritis   . Asthma   . Atrial fibrillation (HCC)   . CHF (congestive heart failure) (HCC)   . COPD (chronic obstructive pulmonary disease) (HCC)   . Diabetes mellitus without complication (HCC)   . Hyperlipidemia   . Hypertension   . Myocardial infarction (HCC)    several  . Pacemaker    CRT-P with RV lead and His Bundle lead ( high threshold)   . Peripheral neuropathy   . Pneumonia   . PONV (postoperative nausea and vomiting)    unsure of exactly what happened at [facility] in 2010 (knee surgery) that led to crital care  . Thyroid disease     Family  History  Problem Relation Age of Onset  . Diabetes Mother   . Hyperlipidemia Mother   . Diabetes Father   . Hyperlipidemia Father   . Heart disease Father        before age 60  . Heart attack Father   . Diabetes Brother   . Hyperlipidemia Brother   . Heart attack Brother     SOCIAL HISTORY: Social History   Tobacco Use  . Smoking status: Former Smoker    Last attempt to quit: 03/01/1998    Years since quitting: 19.1  . Smokeless tobacco: Never Used  Substance Use Topics  . 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    Allergies  Allergen Reactions  . Indomethacin Other (See Comments)     Renal Insufficiency  . Pregabalin Other (See Comments)    DIZZINESS   . Sulfa Antibiotics Hives  . Morphine And Related Nausea And Vomiting    Current Outpatient Medications  Medication Sig Dispense Refill  . acetaminophen (TYLENOL) 500 MG tablet Take 1,000 mg by mouth daily as needed for moderate pain.    . albuterol (PROVENTIL HFA;VENTOLIN HFA) 108 (90 Base) MCG/ACT inhaler Inhale 2 puffs into   the lungs every 6 (six) hours as needed for wheezing or shortness of breath. 1 Inhaler 2  . alendronate (FOSAMAX) 10 MG tablet Take 1 tablet (10 mg total) by mouth daily before breakfast. Take with a full glass of water on an empty stomach. 30 tablet 11  . allopurinol (ZYLOPRIM) 100 MG tablet Take 1 tablet (100 mg total) by mouth daily. 90 tablet 2  . AMBULATORY NON FORMULARY MEDICATION Motorized scooter.  Diagnosis: Osteoarthritis M19.90 1 each 0  . Blood Glucose Monitoring Suppl (GLUCOCOM BLOOD GLUCOSE MONITOR) DEVI Use to check blood sugars 6 times daily E11.65    . DULoxetine (CYMBALTA) 30 MG capsule Take 1 capsule (30 mg total) by mouth at bedtime. 90 capsule 1  . ELIQUIS 5 MG TABS tablet TAKE ONE TABLET TWICE DAILY 180 tablet 1  . fluticasone (FLONASE) 50 MCG/ACT nasal spray Place 2 sprays into both nostrils at bedtime.    .  fluticasone (FLOVENT HFA) 110 MCG/ACT inhaler Inhale 2 puffs into the lungs 2 (two) times daily as needed (bronchitis).    . Insulin Disposable Pump (V-GO 40) KIT Inject into the skin See admin instructions. Use with Novolog - 6 pumps before each meal    . Levothyroxine Sodium 150 MCG CAPS Take 1 capsule (150 mcg total) by mouth daily before breakfast. 30 capsule 3  . metoprolol succinate (TOPROL-XL) 25 MG 24 hr tablet Take 1 tablet (25 mg total) by mouth daily. 90 tablet 1  . NOVOLOG 100 UNIT/ML injection 100 units per day in VGO unknown bolus    . ondansetron (ZOFRAN ODT) 8 MG disintegrating tablet Take 1 tablet (8 mg total) by mouth every 8 (eight) hours as needed for nausea or vomiting. 20 tablet 0  . OVER THE COUNTER MEDICATION Take 0.5 tablets by mouth at bedtime. THC Candy from [place]    . oxyCODONE-acetaminophen (PERCOCET/ROXICET) 5-325 MG tablet Take 1 tablet by mouth every 6 (six) hours as needed for severe pain. 16 tablet 0  . Prenatal Vit-Fe Fumarate-FA (MULTIVITAMIN-PRENATAL) 27-0.8 MG TABS tablet Take 1 tablet by mouth daily. Takes for nail and hair growth     . ramipril (ALTACE) 2.5 MG capsule Take 1 capsule (2.5 mg total) at bedtime by mouth. 90 capsule 1  . torsemide (DEMADEX) 10 MG tablet Take 1 tablet (10 mg total) by mouth daily. 90 tablet 1  . Vitamin D, Ergocalciferol, (DRISDOL) 50000 units CAPS capsule Take 1 capsule (50,000 Units total) by mouth every 7 (seven) days. 8 capsule 0   No current facility-administered medications for this visit.     REVIEW OF SYSTEMS:  [X] denotes positive finding, [ ] denotes negative finding Cardiac  Comments:  Chest pain or chest pressure:    Shortness of breath upon exertion:    Short of breath when lying flat:    Irregular heart rhythm: x       Vascular    Pain in calf, thigh, or hip brought on by ambulation: x   Pain in feet at night that wakes you up from your sleep:  x   Blood clot in your veins:    Leg swelling:           Pulmonary    Oxygen at home:    Productive cough:     Wheezing:         Neurologic    Sudden weakness in arms or legs:     Sudden numbness in arms or legs:     Sudden onset of difficulty   speaking or slurred speech:    Temporary loss of vision in one eye:     Problems with dizziness:         Gastrointestinal    Blood in stool:     Vomited blood:         Genitourinary    Burning when urinating:     Blood in urine:        Psychiatric    Major depression:         Hematologic    Bleeding problems:    Problems with blood clotting too easily:        Skin    Rashes or ulcers:        Constitutional    Fever or chills:     PHYSICAL EXAM:   Vitals:   04/20/17 1146 04/20/17 1149  BP: (!) 152/74 (!) 156/72  Pulse: 70   Resp: 18   Temp: 98.3 F (36.8 C)   TempSrc: Oral   SpO2: 96%   Weight: 231 lb (104.8 kg)   Height: 5' 2" (1.575 m)     GENERAL: The patient is a well-nourished female, in no acute distress. The vital signs are documented above. CARDIAC: There is a regular rate and rhythm.  VASCULAR: I do not detect carotid bruits. She has palpable femoral pulses.  I cannot palpate popliteal or pedal pulses. She has no significant lower extremity swelling. PULMONARY: There is good air exchange bilaterally without wheezing or rales. ABDOMEN: Soft and non-tender with normal pitched bowel sounds.  MUSCULOSKELETAL: There are no major deformities or cyanosis. NEUROLOGIC: No focal weakness or paresthesias are detected. SKIN: Her right second toe amputation is healed. PSYCHIATRIC: The patient has a normal affect.  DATA:    BILATERAL LOWER EXTREMITY DOPPLER STUDY: I have independently interpreted her bilateral lower extremity Doppler study.  On the right side she has a biphasic posterior tibial and dorsalis pedis signal.  ABI is 87%.  Toe pressure on the right is 0.  On the left side she has a monophasic posterior tibial and dorsalis pedis signal.  ABI on the left is 54%.   Toe pressure on the left is 0  MEDICAL ISSUES:   INFRAINGUINAL ARTERIAL OCCLUSIVE DISEASE BILATERALLY: Given the progression of her symptoms on the left, now with rest pain, I have recommended that we proceed with arteriography and possible intervention. I have reviewed with the patient the indications for arteriography. In addition, I have reviewed the potential complications of arteriography including but not limited to: Bleeding, arterial injury, arterial thrombosis, dye action, renal insufficiency, or other unpredictable medical problems. I have explained to the patient that if we find disease amenable to angioplasty we could potentially address this at the same time. I have discussed the potential complications of angioplasty and stenting, including but not limited to: Bleeding, arterial thrombosis, arterial injury, dissection, or the need for surgical intervention.  I think that it would be best to hold off on any surgery on her toenail on the left foot until we have done her arteriogram.  This is scheduled for 05/06/2017.  We will have to hold her Eliquis for 48 hours prior to scheduling this.     [facility] [phone] 

## 2017-04-20 NOTE — Progress Notes (Signed)
Patient name: Alexis [name] MRN: [id number] DOB: 1950-08-21 Sex: female  REASON FOR VISIT:   Follow-up of peripheral vascular disease.  HPI:   ANAIJAH [name] is a pleasant 67 y.o. female who I last saw on 11/03/2016.  The patient had presented with osteomyelitis of her right second toe.  She underwent endovascular revascularization by Dr. Donzetta Matters with an excellent result.  She had single-vessel runoff on the right via the peroneal artery.  The toe amputation site has healed when I saw her last.  She had biphasic Doppler signals in the right foot with an ABI of 100% and a toe pressure of 94.  On the left side she had biphasic Doppler signals with an ABI of 97% and a toe pressure of 76 mmHg.  He comes in for a 78-monthfollow-up visit.  Since I saw her last her right leg is well.  She denies any symptoms on the right.  The toe amputation site is healed.  However, on the left leg she developed left calf claudication at a short distance.  This has gradually progressed.  Her symptoms are brought on by ambulation and relieved with rest.  There are no other aggravating or alleviating factors.  She is also developed some rest pain in the left foot.  She denies any nonhealing ulcers.  She does have a toenail on the left great toe which is scheduled to be operated on.  Past Medical History:  Diagnosis Date  . Allergy   . Anxiety   . Arthritis   . Asthma   . Atrial fibrillation ([place]   . [place]congestive heart failure) ([place]   . COPD (chronic obstructive pulmonary disease) ([place]   . Diabetes mellitus without complication ([facility]   . Hyperlipidemia   . Hypertension   . Myocardial infarction ([facility]    several  . Pacemaker    CRT-P with RV lead and His Bundle lead ( high threshold)   . Peripheral neuropathy   . Pneumonia   . PONV (postoperative nausea and vomiting)    [facility] that led to crital care  . Thyroid disease     Family  History  Problem Relation Age of Onset  . Diabetes Mother   . Hyperlipidemia Mother   . Diabetes Father   . Hyperlipidemia Father   . Heart disease Father        before age 69 . Heart attack Father   . Diabetes Brother   . Hyperlipidemia Brother   . Heart attack Brother     SOCIAL HISTORY: Social History   Tobacco Use  . Smoking status: Former Smoker    Last attempt to quit: 03/01/1998    Years since quitting: 19.1  . Smokeless tobacco: Never Used  Substance Use Topics  . 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    Allergies  Allergen Reactions  . Indomethacin Other (See Comments)     Renal Insufficiency  . Pregabalin Other (See Comments)    DIZZINESS   . Sulfa Antibiotics Hives  . Morphine And Related Nausea And Vomiting    Current Outpatient Medications  Medication Sig Dispense Refill  . acetaminophen (TYLENOL) 500 MG tablet Take 1,000 mg by mouth daily as needed for moderate pain.    .Marland Kitchenalbuterol (PROVENTIL HFA;VENTOLIN HFA) 108 (90 Base) MCG/ACT inhaler Inhale 2 puffs into  the lungs every 6 (six) hours as needed for wheezing or shortness of breath. 1 Inhaler 2  . alendronate (FOSAMAX) 10 MG tablet Take 1 tablet (10 mg total) by mouth daily before breakfast. Take with a full glass of water on an empty stomach. 30 tablet 11  . allopurinol (ZYLOPRIM) 100 MG tablet Take 1 tablet (100 mg total) by mouth daily. 90 tablet 2  . AMBULATORY NON FORMULARY MEDICATION Motorized scooter.  Diagnosis: Osteoarthritis M19.90 1 each 0  . Blood Glucose Monitoring Suppl (GLUCOCOM BLOOD GLUCOSE MONITOR) DEVI Use to check blood sugars 6 times daily E11.65    . DULoxetine (CYMBALTA) 30 MG capsule Take 1 capsule (30 mg total) by mouth at bedtime. 90 capsule 1  . ELIQUIS 5 MG TABS tablet TAKE ONE TABLET TWICE DAILY 180 tablet 1  . fluticasone (FLONASE) 50 MCG/ACT nasal spray Place 2 sprays into both nostrils at bedtime.    .  fluticasone (FLOVENT HFA) 110 MCG/ACT inhaler Inhale 2 puffs into the lungs 2 (two) times daily as needed (bronchitis).    . Insulin Disposable Pump (V-GO 40) KIT Inject into the skin See admin instructions. Use with Novolog - 6 pumps before each meal    . Levothyroxine Sodium 150 MCG CAPS Take 1 capsule (150 mcg total) by mouth daily before breakfast. 30 capsule 3  . metoprolol succinate (TOPROL-XL) 25 MG 24 hr tablet Take 1 tablet (25 mg total) by mouth daily. 90 tablet 1  . NOVOLOG 100 UNIT/ML injection 100 units per day in VGO unknown bolus    . ondansetron (ZOFRAN ODT) 8 MG disintegrating tablet Take 1 tablet (8 mg total) by mouth every 8 (eight) hours as needed for nausea or vomiting. 20 tablet 0  . OVER THE COUNTER MEDICATION Take 0.5 tablets by mouth at bedtime. THC Candy from [place]    . oxyCODONE-acetaminophen (PERCOCET/ROXICET) 5-325 MG tablet Take 1 tablet by mouth every 6 (six) hours as needed for severe pain. 16 tablet 0  . Prenatal Vit-Fe Fumarate-FA (MULTIVITAMIN-PRENATAL) 27-0.8 MG TABS tablet Take 1 tablet by mouth daily. Takes for nail and hair growth     . ramipril (ALTACE) 2.5 MG capsule Take 1 capsule (2.5 mg total) at bedtime by mouth. 90 capsule 1  . torsemide (DEMADEX) 10 MG tablet Take 1 tablet (10 mg total) by mouth daily. 90 tablet 1  . Vitamin D, Ergocalciferol, (DRISDOL) 50000 units CAPS capsule Take 1 capsule (50,000 Units total) by mouth every 7 (seven) days. 8 capsule 0   No current facility-administered medications for this visit.     REVIEW OF SYSTEMS:  _0  denotes positive finding, _1  denotes negative finding Cardiac  Comments:  Chest pain or chest pressure:    Shortness of breath upon exertion:    Short of breath when lying flat:    Irregular heart rhythm: x       Vascular    Pain in calf, thigh, or hip brought on by ambulation: x   Pain in feet at night that wakes you up from your sleep:  x   Blood clot in your veins:    Leg swelling:           Pulmonary    Oxygen at home:    Productive cough:     Wheezing:         Neurologic    Sudden weakness in arms or legs:     Sudden numbness in arms or legs:     Sudden onset of difficulty  speaking or slurred speech:    Temporary loss of vision in one eye:     Problems with dizziness:         Gastrointestinal    Blood in stool:     Vomited blood:         Genitourinary    Burning when urinating:     Blood in urine:        Psychiatric    Major depression:         Hematologic    Bleeding problems:    Problems with blood clotting too easily:        Skin    Rashes or ulcers:        Constitutional    Fever or chills:     PHYSICAL EXAM:   Vitals:   04/20/17 1146 04/20/17 1149  BP: (!) 152/74 (!) 156/72  Pulse: 70   Resp: 18   Temp: 98.3 F (36.8 C)   TempSrc: Oral   SpO2: 96%   Weight: 231 lb (104.8 kg)   Height: _0  (1.575 m)     GENERAL: The patient is a well-nourished female, in no acute distress. The vital signs are documented above. CARDIAC: There is a regular rate and rhythm.  VASCULAR: I do not detect carotid bruits. She has palpable femoral pulses.  I cannot palpate popliteal or pedal pulses. She has no significant lower extremity swelling. PULMONARY: There is good air exchange bilaterally without wheezing or rales. ABDOMEN: Soft and non-tender with normal pitched bowel sounds.  MUSCULOSKELETAL: There are no major deformities or cyanosis. NEUROLOGIC: No focal weakness or paresthesias are detected. SKIN: Her right second toe amputation is healed. PSYCHIATRIC: The patient has a normal affect.  DATA:    BILATERAL LOWER EXTREMITY DOPPLER STUDY: I have independently interpreted her bilateral lower extremity Doppler study.  On the right side she has a biphasic posterior tibial and dorsalis pedis signal.  ABI is 87%.  Toe pressure on the right is 0.  On the left side she has a monophasic posterior tibial and dorsalis pedis signal.  ABI on the left is 54%.   Toe pressure on the left is 0  MEDICAL ISSUES:   INFRAINGUINAL ARTERIAL OCCLUSIVE DISEASE BILATERALLY: Given the progression of her symptoms on the left, now with rest pain, I have recommended that we proceed with arteriography and possible intervention. I have reviewed with the patient the indications for arteriography. In addition, I have reviewed the potential complications of arteriography including but not limited to: Bleeding, arterial injury, arterial thrombosis, dye action, renal insufficiency, or other unpredictable medical problems. I have explained to the patient that if we find disease amenable to angioplasty we could potentially address this at the same time. I have discussed the potential complications of angioplasty and stenting, including but not limited to: Bleeding, arterial thrombosis, arterial injury, dissection, or the need for surgical intervention.  I think that it would be best to hold off on any surgery on her toenail on the left foot until we have done her arteriogram.  This is scheduled for 05/06/2017.  We will have to hold her Eliquis for 48 hours prior to scheduling this.   [name] [facility] [phone]

## 2017-04-22 ENCOUNTER — Encounter: Payer: Self-pay | Admitting: Medical

## 2017-04-22 ENCOUNTER — Other Ambulatory Visit: Payer: Self-pay

## 2017-04-22 DIAGNOSIS — E559 Vitamin D deficiency, unspecified: Secondary | ICD-10-CM

## 2017-04-28 ENCOUNTER — Ambulatory Visit: Payer: Self-pay | Admitting: Podiatry

## 2017-05-03 ENCOUNTER — Telehealth: Payer: Self-pay

## 2017-05-03 ENCOUNTER — Other Ambulatory Visit: Payer: Self-pay

## 2017-05-03 ENCOUNTER — Telehealth: Payer: Self-pay | Admitting: Medical

## 2017-05-03 MED ORDER — LEVOTHYROXINE SODIUM 150 MCG PO CAPS: 1.0000 | ORAL_CAPSULE | Freq: Every day | ORAL | 3 refills | Status: DC

## 2017-05-03 NOTE — Telephone Encounter (Signed)
Copied from [place] [phone]. Topic: General - Other >> May 03, 2017  1:13 PM Cecelia Byars, NT wrote: Reason for CRM: Patient says she found a place to get the mobility scooter she just needs the prescription faxed to  [phone] [place] attention Shanon Brow  please  call him at  [phone] with any questions

## 2017-05-03 NOTE — Telephone Encounter (Signed)
Copied from [place] [id number]. Topic: General - Other >> May 03, 2017  1:13 PM Cecelia Byars, NT wrote: Reason for CRM: Patient says she found a place to get the mobility scooter she just needs the prescription faxed to  [phone] [place] attention Shanon Brow  please  call him at  [phone] with any questions   Rx faxed.

## 2017-05-04 ENCOUNTER — Encounter (HOSPITAL_COMMUNITY): Payer: Medicare HMO

## 2017-05-04 ENCOUNTER — Ambulatory Visit: Payer: Medicare HMO | Admitting: Vascular Surgery

## 2017-05-04 NOTE — Telephone Encounter (Signed)
I sent prescription for mobility scooter.  Would you see the [address] [place] the patient gave.  Fax over the order please and keep a copy of the prescription on file if we need to resubmit.  I am putting the prescription on your keyboard.

## 2017-05-06 ENCOUNTER — Encounter (HOSPITAL_COMMUNITY)
Admission: RE | Disposition: A | Discharge status: Home / Self Care | Payer: Self-pay | Source: Ambulatory Visit | Attending: Vascular Surgery

## 2017-05-06 ENCOUNTER — Ambulatory Visit (HOSPITAL_COMMUNITY)
Admission: RE | Admit: 2017-05-06 | Discharge: 2017-05-06 | Disposition: A | Discharge status: Home / Self Care | Payer: Medicare HMO | Source: Ambulatory Visit | Attending: Vascular Surgery | Admitting: Vascular Surgery

## 2017-05-06 DIAGNOSIS — Z794 Long term (current) use of insulin: Secondary | ICD-10-CM | POA: Diagnosis not present

## 2017-05-06 DIAGNOSIS — Z7901 Long term (current) use of anticoagulants: Secondary | ICD-10-CM | POA: Insufficient documentation

## 2017-05-06 DIAGNOSIS — M1990 Unspecified osteoarthritis, unspecified site: Secondary | ICD-10-CM | POA: Insufficient documentation

## 2017-05-06 DIAGNOSIS — Z950 Presence of cardiac pacemaker: Secondary | ICD-10-CM | POA: Diagnosis not present

## 2017-05-06 DIAGNOSIS — I509 Heart failure, unspecified: Secondary | ICD-10-CM | POA: Diagnosis not present

## 2017-05-06 DIAGNOSIS — E785 Hyperlipidemia, unspecified: Secondary | ICD-10-CM | POA: Diagnosis not present

## 2017-05-06 DIAGNOSIS — Z885 Allergy status to narcotic agent status: Secondary | ICD-10-CM | POA: Insufficient documentation

## 2017-05-06 DIAGNOSIS — E669 Obesity, unspecified: Secondary | ICD-10-CM | POA: Diagnosis not present

## 2017-05-06 DIAGNOSIS — Z7951 Long term (current) use of inhaled steroids: Secondary | ICD-10-CM | POA: Diagnosis not present

## 2017-05-06 DIAGNOSIS — Z87891 Personal history of nicotine dependence: Secondary | ICD-10-CM | POA: Insufficient documentation

## 2017-05-06 DIAGNOSIS — I110 Hypertensive heart disease with heart failure: Secondary | ICD-10-CM | POA: Insufficient documentation

## 2017-05-06 DIAGNOSIS — F419 Anxiety disorder, unspecified: Secondary | ICD-10-CM | POA: Insufficient documentation

## 2017-05-06 DIAGNOSIS — Z882 Allergy status to sulfonamides status: Secondary | ICD-10-CM | POA: Insufficient documentation

## 2017-05-06 DIAGNOSIS — J449 Chronic obstructive pulmonary disease, unspecified: Secondary | ICD-10-CM | POA: Insufficient documentation

## 2017-05-06 DIAGNOSIS — I701 Atherosclerosis of renal artery: Secondary | ICD-10-CM | POA: Insufficient documentation

## 2017-05-06 DIAGNOSIS — I4891 Unspecified atrial fibrillation: Secondary | ICD-10-CM | POA: Insufficient documentation

## 2017-05-06 DIAGNOSIS — I70212 Atherosclerosis of native arteries of extremities with intermittent claudication, left leg: Secondary | ICD-10-CM | POA: Diagnosis not present

## 2017-05-06 DIAGNOSIS — E1151 Type 2 diabetes mellitus with diabetic peripheral angiopathy without gangrene: Secondary | ICD-10-CM | POA: Diagnosis not present

## 2017-05-06 DIAGNOSIS — Z6841 Body Mass Index (BMI) 40.0 and over, adult: Secondary | ICD-10-CM | POA: Insufficient documentation

## 2017-05-06 DIAGNOSIS — I70222 Atherosclerosis of native arteries of extremities with rest pain, left leg: Secondary | ICD-10-CM | POA: Insufficient documentation

## 2017-05-06 DIAGNOSIS — I252 Old myocardial infarction: Secondary | ICD-10-CM | POA: Diagnosis not present

## 2017-05-06 DIAGNOSIS — E079 Disorder of thyroid, unspecified: Secondary | ICD-10-CM | POA: Insufficient documentation

## 2017-05-06 DIAGNOSIS — E1140 Type 2 diabetes mellitus with diabetic neuropathy, unspecified: Secondary | ICD-10-CM | POA: Insufficient documentation

## 2017-05-06 HISTORY — PX: ABDOMINAL AORTOGRAM W/LOWER EXTREMITY: CATH118223

## 2017-05-06 LAB — POCT I-STAT, CHEM 8
BUN: 25 mg/dL — AB (ref 6–20)
CHLORIDE: 100 mmol/L — AB (ref 101–111)
Calcium, Ion: 1.22 mmol/L (ref 1.15–1.40)
Creatinine, Ser: 0.7 mg/dL (ref 0.44–1.00)
Glucose, Bld: 246 mg/dL — ABNORMAL HIGH (ref 65–99)
HEMATOCRIT: 41 % (ref 36.0–46.0)
Hemoglobin: 13.9 g/dL (ref 12.0–15.0)
POTASSIUM: 3.9 mmol/L (ref 3.5–5.1)
SODIUM: 139 mmol/L (ref 135–145)
TCO2: 30 mmol/L (ref 22–32)

## 2017-05-06 LAB — GLUCOSE, CAPILLARY
GLUCOSE-CAPILLARY: 246 mg/dL — AB (ref 65–99)
Glucose-Capillary: 189 mg/dL — ABNORMAL HIGH (ref 65–99)
Glucose-Capillary: 154 mg/dL — ABNORMAL HIGH (ref 65–99)

## 2017-05-06 SURGERY — ABDOMINAL AORTOGRAM W/LOWER EXTREMITY
Anesthesia: LOCAL

## 2017-05-06 MED ORDER — SODIUM CHLORIDE 0.9 % IV SOLN
INTRAVENOUS | Status: DC
  Administered 2017-05-06: 07:00:00 via INTRAVENOUS

## 2017-05-06 MED ORDER — LIDOCAINE HCL 1 % IJ SOLN
INTRAMUSCULAR | Status: AC
  Filled 2017-05-06: qty 20

## 2017-05-06 MED ORDER — HEPARIN (PORCINE) IN NACL 2-0.9 UNIT/ML-% IJ SOLN
INTRAMUSCULAR | Status: AC | PRN
  Administered 2017-05-06: 500 mL

## 2017-05-06 MED ORDER — SODIUM CHLORIDE 0.9 % WEIGHT BASED INFUSION: 1.0000 mL/kg/h | INTRAVENOUS | Status: DC

## 2017-05-06 MED ORDER — MIDAZOLAM HCL 2 MG/2ML IJ SOLN
INTRAMUSCULAR | Status: DC | PRN
  Administered 2017-05-06: 1 mg via INTRAVENOUS

## 2017-05-06 MED ORDER — HYDRALAZINE HCL 20 MG/ML IJ SOLN: 5.0000 mg | INTRAMUSCULAR | Status: DC | PRN

## 2017-05-06 MED ORDER — MIDAZOLAM HCL 2 MG/2ML IJ SOLN
INTRAMUSCULAR | Status: AC
  Filled 2017-05-06: qty 2

## 2017-05-06 MED ORDER — SODIUM CHLORIDE 0.9 % IV SOLN: 250.0000 mL | INTRAVENOUS | Status: DC | PRN

## 2017-05-06 MED ORDER — SODIUM CHLORIDE 0.9% FLUSH: 3.0000 mL | INTRAVENOUS | Status: DC | PRN

## 2017-05-06 MED ORDER — SODIUM CHLORIDE 0.9% FLUSH: 3.0000 mL | Freq: Two times a day (BID) | INTRAVENOUS | Status: DC

## 2017-05-06 MED ORDER — ACETAMINOPHEN 160 MG/5ML PO SOLN: 650.0000 mg | Freq: Four times a day (QID) | ORAL | Status: DC | PRN

## 2017-05-06 MED ORDER — HEPARIN (PORCINE) IN NACL 2-0.9 UNIT/ML-% IJ SOLN
INTRAMUSCULAR | Status: AC
  Filled 2017-05-06: qty 1000

## 2017-05-06 MED ORDER — FENTANYL CITRATE (PF) 100 MCG/2ML IJ SOLN
INTRAMUSCULAR | Status: AC
  Filled 2017-05-06: qty 2

## 2017-05-06 MED ORDER — IODIXANOL 320 MG/ML IV SOLN
INTRAVENOUS | Status: DC | PRN
  Administered 2017-05-06: 145 mL via INTRA_ARTERIAL

## 2017-05-06 MED ORDER — LABETALOL HCL 5 MG/ML IV SOLN: 10.0000 mg | INTRAVENOUS | Status: DC | PRN

## 2017-05-06 MED ORDER — ACETAMINOPHEN 325 MG PO TABS
650.0000 mg | ORAL_TABLET | ORAL | Status: DC | PRN
  Administered 2017-05-06: 650 mg via ORAL

## 2017-05-06 MED ORDER — ACETAMINOPHEN 325 MG PO TABS
ORAL_TABLET | ORAL | Status: AC
  Filled 2017-05-06: qty 2

## 2017-05-06 MED ORDER — FENTANYL CITRATE (PF) 100 MCG/2ML IJ SOLN
INTRAMUSCULAR | Status: DC | PRN
  Administered 2017-05-06: 50 ug via INTRAVENOUS

## 2017-05-06 MED ORDER — LIDOCAINE HCL (PF) 1 % IJ SOLN
INTRAMUSCULAR | Status: DC | PRN
  Administered 2017-05-06: 18 mL

## 2017-05-06 SURGICAL SUPPLY — 11 items
CATH ANGIO 5F PIGTAIL 65CM (CATHETERS) ×2 IMPLANT
COVER PRB 48X5XTLSCP FOLD TPE (BAG) IMPLANT
COVER PROBE 5X48 (BAG) ×2
KIT MICROPUNCTURE NIT STIFF (SHEATH) ×2 IMPLANT
KIT PV (KITS) ×2 IMPLANT
SHEATH PINNACLE 5F 10CM (SHEATH) ×1 IMPLANT
SYRINGE MEDRAD AVANTA MACH 7 (SYRINGE) ×1 IMPLANT
TRANSDUCER W/STOPCOCK (MISCELLANEOUS) ×2 IMPLANT
TRAY PV CATH (CUSTOM PROCEDURE TRAY) ×2 IMPLANT
WIRE HITORQ VERSACORE ST 145CM (WIRE) ×2 IMPLANT
WIRE TORQFLEX AUST .018X40CM (WIRE) ×1 IMPLANT

## 2017-05-06 NOTE — Interval H&P Note (Signed)
History and Physical Interval Note:  05/06/2017 8:51 AM  Alexis [name]  has presented today for surgery, with the diagnosis of pvd - claudication  The various methods of treatment have been discussed with the patient and family. After consideration of risks, benefits and other options for treatment, the patient has consented to  Procedure(s): ABDOMINAL AORTOGRAM W/LOWER EXTREMITY (N/A) as a surgical intervention .  The patient's history has been reviewed, patient examined, no change in status, stable for surgery.  I have reviewed the patient's chart and labs.  Questions were answered to the patient's satisfaction.     [name]

## 2017-05-06 NOTE — Discharge Instructions (Signed)

## 2017-05-06 NOTE — Progress Notes (Signed)
Site area: Left  Groin a 5 french arterial sheath was removed  Site Prior to Removal:  Level 0  Pressure Applied For 20 MINUTES    Bedrest Beginning at 1100am  Manual:   Yes.    Patient Status During Pull:  stable  Post Pull Groin Site:  Level 0  Post Pull Instructions Given:  Yes.    Post Pull Pulses Present:  Yes.    Dressing Applied:  Yes.    Comments:  VS remain stable during sheath pull  Tylenol given for hip pain

## 2017-05-06 NOTE — Progress Notes (Signed)
Patient instructed to start back on Eliquis tonight per Dr. Scot Dock.

## 2017-05-06 NOTE — Op Note (Signed)
PATIENT: Alexis [name]      MRN: [id number] DOB: 17-Apr-1950    DATE OF PROCEDURE: 05/06/2017  INDICATIONS:    Alexis [name] is a 67 y.o. female who presented with left lower extremity claudication.  She presents for arteriography and possible intervention.  PROCEDURE:    1.  Ultrasound-guided access to the left common femoral artery 2.  Aortogram with bilateral iliac arteriogram and bilateral lower extremity runoff 3.  Conscious sedation  SURGEON: Judeth Cornfield. Scot Dock, MD, FACS  ANESTHESIA: Local with sedation  EBL: Minimal  TECHNIQUE: The patient was taken to the peripheral vascular lab and was sedated. The period of conscious sedation was 44 minutes.  During that time period, I was present face-to-face 100% of the time.  The patient was administered 1 mg of Versed and 50 mcg of fentanyl. The patient's heart rate, blood pressure, and oxygen saturation were monitored by the nurse continuously during the procedure.  I interrogated the right groin with the SonoSite after the patient had been prepped and draped.  The patient appeared to have a high bifurcation of the common femoral artery.  In addition, the patient had a large pannus which had been taped superiorly.  Therefore there was no way to safely cannulate the common femoral artery on the right.  I therefore selected a left-sided approach.  On the left side, it did appear that I could stick above the bifurcation below the pannus.  Under ultrasound guidance, after the skin was anesthetized, the left common femoral artery was cannulated with a micropuncture needle and a micropuncture sheath introduced over a wire.  This was exchanged for a 5 [place] sheath over a Bentson wire.  The pigtail catheter was positioned at the L1 vertebral body and flush aortogram obtained.  The catheter was then positioned above the aortic bifurcation and an oblique iliac projection was obtained.  Next bilateral lower extremity runoff films were  obtained.  The pigtail catheter was then removed over a wire and a retrograde right femoral arteriogram was obtained in LAO projection in order to evaluate the cannulation site.  The cannulation site was in the common femoral artery.  We then did a lateral shot of the knee to examine the popliteal artery given that the patient had a knee replacement which was limiting visualization from the anterior view.  At the completion the patient was transferred to the holding area for removal of the sheath.  No immediate complications were noted.   FINDINGS:   1.  There are single renal arteries bilaterally with a 30% left renal artery stenosis. 2.  The infrarenal aorta, bilateral common iliac arteries, bilateral external iliac arteries, and bilateral hypogastric arteries are patent. 3.  On the left side, which is the symptomatic side, the common femoral and deep femoral artery are patent.  Superficial femoral artery is patent proximally with some mild to moderate diffuse disease.  There is a tight 95% stenosis in the distal superficial femoral artery but also disease below this and an eccentric plaque in the popliteal artery at the level of the knee which is calcified.  Below that the below-knee popliteal artery is patent and there is two-vessel runoff on the left via the anterior tibial and peroneal arteries.  Posterior tibial artery is occluded. 4 On the left side, the common femoral and deep femoral artery are patent.  There is mild diffuse disease of the superficial femoral artery.  The popliteal artery is patent.. There is single-vessel runoff on the right via  the peroneal artery.  The anterior tibial artery is occluded.  The posterior tibial artery is occluded in the mid calf.  CLINICAL NOTE: I do not think the patient is a good candidate for endovascular approach to her infrainguinal arterial occlusive disease.  The right side cannot be cannulated because of her pannus and the high bifurcation of the  common femoral artery.  The left side cannot be cannulated in a retrograde fashion because of her large pannus.  Potentially this could be done in the operating room using a open approach to the superficial femoral artery on the left however the disease is quite quite extensive around the level of the knee therefore think if she developed a limb threatening problem this would best be done with a femoral to below-knee popliteal artery bypass.  Because of her diabetes and obesity certainly she would be at high risk for wound healing problems and infection.  Fortunately her symptoms are tolerable we have discussed the importance of structured walking program.  I will see her back in 6 weeks to follow her symptoms.  Alexis Mayo, MD, FACS [facility]  DATE OF DICTATION:   05/06/2017

## 2017-05-09 ENCOUNTER — Telehealth: Payer: Self-pay | Admitting: Vascular Surgery

## 2017-05-09 ENCOUNTER — Encounter (HOSPITAL_COMMUNITY): Payer: Self-pay | Admitting: Vascular Surgery

## 2017-05-09 NOTE — Telephone Encounter (Signed)
-----   Message from Mena Goes, RN sent at 05/06/2017 10:26 AM EST ----- Regarding: 6 weeks with GSV vein mapping   ----- Message ----- From: Angelia Mould, MD Sent: 05/06/2017  10:04 AM To: Vvs Charge Pool Subject: charge                                          PROCEDURE:   1.  Ultrasound-guided access to the left common femoral artery 2.  Aortogram with bilateral iliac arteriogram and bilateral lower extremity runoff 3.  Conscious sedation  SURGEON: Judeth Cornfield. Scot Dock, MD, FACS  ANESTHESIA: Local with sedation  She will need a follow-up visit in 6 weeks with a left great saphenous vein map at that time.  Thank you.

## 2017-05-09 NOTE — Telephone Encounter (Signed)
LVM for pt re 06/14/17 appt and 06/15/17 appt

## 2017-05-10 ENCOUNTER — Telehealth: Payer: Self-pay | Admitting: *Deleted

## 2017-05-10 NOTE — Telephone Encounter (Signed)
Per Medicare regulations, patient must have face-to-face encounter for power mobility examination & assessment and 7-Element written order attached must be completed as well; forwarded paperwork to PCP/SLS 03/12

## 2017-05-11 ENCOUNTER — Encounter: Payer: Self-pay | Admitting: Vascular Surgery

## 2017-05-16 ENCOUNTER — Encounter: Payer: Self-pay | Admitting: Medical

## 2017-05-17 NOTE — Telephone Encounter (Signed)
Left pt a message notifying her PCP has forms and will review them. Also notifying pt she may need a office visit.

## 2017-05-18 ENCOUNTER — Encounter: Payer: Self-pay | Admitting: Medical

## 2017-05-18 ENCOUNTER — Ambulatory Visit (INDEPENDENT_AMBULATORY_CARE_PROVIDER_SITE_OTHER): Payer: Medicare HMO | Admitting: Medical

## 2017-05-18 VITALS — BP 159/66 | HR 66 | Temp 97.8°F | Resp 16 | Ht 62.0 in | Wt 228.2 lb

## 2017-05-18 DIAGNOSIS — I739 Peripheral vascular disease, unspecified: Secondary | ICD-10-CM

## 2017-05-18 DIAGNOSIS — J449 Chronic obstructive pulmonary disease, unspecified: Secondary | ICD-10-CM

## 2017-05-18 DIAGNOSIS — G629 Polyneuropathy, unspecified: Secondary | ICD-10-CM | POA: Diagnosis not present

## 2017-05-18 DIAGNOSIS — R269 Unspecified abnormalities of gait and mobility: Secondary | ICD-10-CM | POA: Diagnosis not present

## 2017-05-18 DIAGNOSIS — Z8679 Personal history of other diseases of the circulatory system: Secondary | ICD-10-CM

## 2017-05-18 DIAGNOSIS — Z1159 Encounter for screening for other viral diseases: Secondary | ICD-10-CM | POA: Diagnosis not present

## 2017-05-18 DIAGNOSIS — Z9181 History of falling: Secondary | ICD-10-CM

## 2017-05-18 NOTE — Patient Instructions (Addendum)
I did fill out the face-to-face office examination guidelines to the best of my ability today.  We will fax this over to [place] specialist.  Hopefully this will help you getting of the appropriate power scoot/chair device that you need.(preferably scooter)  Follow-up as regularly scheduled.

## 2017-05-18 NOTE — Progress Notes (Addendum)
Subjective:    Patient ID: Alexis [name], female    DOB: 1950-08-10, 67 y.o.   MRN: [id number]  HPI  I did fill out face-to-face office exam power wheelchair/mobility scooter sheet.  I think the reasons that justify her getting mobility device are neuropathy, peripheral vascular disease, right second toe amputation, arthritis, gait difficulty, history of falls, COPD, CHF and history of right knee replacement with occasional knee pain.  Combination of all these problems makes it difficult for her to ambulate even with a cane per her report.  She states that she does not have endurance to do daily activities such as shopping.  Often times she will have to borrow scooter from store if they are available.  Also she states that even doing activities such as standing and cooking in her kitchen is difficult without her legs giving out.   Review of Systems  Mostly her symptoms are shortness of breath on ambulation/poor endurance, feeling off balance, feet numbness, history of occasional falls and leg weakness     Past Medical History:  Diagnosis Date  . Allergy   . Anxiety   . Arthritis   . Asthma   . Atrial fibrillation ([place])   . CHF (congestive heart failure) ([place])   . COPD (chronic obstructive pulmonary disease) ([place])   . Diabetes mellitus without complication ([place])   . Hyperlipidemia   . Hypertension   . Myocardial infarction ([place])    several  . Pacemaker    CRT-P with RV lead and His Bundle lead ( high threshold)   . Peripheral neuropathy   . Pneumonia   . PONV (postoperative nausea and vomiting)    unsure of exactly what happened at [facility] in 2010 (knee surgery) that led to crital care  . Thyroid disease      Social History   Socioeconomic History  . Marital status: Married    Spouse name: Not on file  . Number of children: Not on file  . Years of education: Not on file  . Highest education level: Not on file  Social Needs  . Financial resource strain:  Not on file  . Food insecurity - worry: Not on file  . Food insecurity - inability: Not on file  . Transportation needs - medical: Not on file  . Transportation needs - non-medical: Not on file  Occupational History  . Occupation: retired  Tobacco Use  . Smoking status: Former Smoker    Last attempt to quit: 03/01/1998    Years since quitting: 19.2  . Smokeless tobacco: Never Used  Substance and Sexual Activity  . 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  . Drug use: Yes    Types: Marijuana    Comment: remote use  . Sexual activity: No  Other Topics Concern  . Not on file  Social History Narrative  . Not on file    Past Surgical History:  Procedure Laterality Date  . ABDOMINAL AORTOGRAM W/LOWER EXTREMITY N/A 04/29/2016   Procedure: Abdominal Aortogram w/Lower Extremity;  Surgeon: Waynetta Sandy, MD;  Location: [facility];  Service: Cardiovascular;  Laterality: N/A;  . ABDOMINAL AORTOGRAM W/LOWER EXTREMITY N/A 05/06/2017   Procedure: ABDOMINAL AORTOGRAM W/LOWER EXTREMITY;  Surgeon: Angelia Mould, MD;  Location: [facility];  Service: Cardiovascular;  Laterality: N/A;  bilateral  . ABDOMINAL HYSTERECTOMY    . AMPUTATION Right 07/30/2016  Procedure: AMPUTATION RIGHT SECOND TOE;  Surgeon: Angelia Mould, MD;  Location: [facility];  Service: Vascular;  Laterality: Right;  . CARDIAC CATHETERIZATION     2005 at [facility]  . PERIPHERAL VASCULAR ATHERECTOMY Right 04/29/2016   Procedure: Peripheral Vascular Atherectomy-Right Popliteal;  Surgeon: Waynetta Sandy, MD;  Location: [facility];  Service: Cardiovascular;  Laterality: Right;  . PERIPHERAL VASCULAR INTERVENTION Right 04/29/2016   Procedure: Peripheral Vascular Intervention-Right Popliteal;  Surgeon: Waynetta Sandy, MD;  Location: [facility];  Service: Cardiovascular;  Laterality: Right;   POPLITEAL PTA  . RADIOFREQUENCY ABLATION    . TOTAL KNEE ARTHROPLASTY    . TUBAL LIGATION      Family History  Problem Relation Age of Onset  . Diabetes Mother   . Hyperlipidemia Mother   . Diabetes Father   . Hyperlipidemia Father   . Heart disease Father        before age 62  . Heart attack Father   . Diabetes Brother   . Hyperlipidemia Brother   . Heart attack Brother     Allergies  Allergen Reactions  . Indomethacin Other (See Comments)     Renal Insufficiency  . Pregabalin Other (See Comments)    DIZZINESS   . Sulfa Antibiotics Hives  . Morphine And Related Nausea And Vomiting    Current Outpatient Medications on File Prior to Visit  Medication Sig Dispense Refill  . acetaminophen (TYLENOL) 500 MG tablet Take 1,000 mg by mouth daily as needed for moderate pain.    Marland Kitchen albuterol (PROVENTIL HFA;VENTOLIN HFA) 108 (90 Base) MCG/ACT inhaler Inhale 2 puffs into the lungs every 6 (six) hours as needed for wheezing or shortness of breath. 1 Inhaler 2  . alendronate (FOSAMAX) 10 MG tablet Take 1 tablet (10 mg total) by mouth daily before breakfast. Take with a full glass of water on an empty stomach. 30 tablet 11  . allopurinol (ZYLOPRIM) 100 MG tablet Take 1 tablet (100 mg total) by mouth daily. (Patient taking differently: Take 100 mg by mouth 2 (two) times daily. ) 90 tablet 2  . AMBULATORY NON FORMULARY MEDICATION Motorized scooter.  Diagnosis: Osteoarthritis M19.90 1 each 0  . Blood Glucose Monitoring Suppl (GLUCOCOM BLOOD GLUCOSE MONITOR) DEVI Use to check blood sugars 6 times daily E11.65    . DULoxetine (CYMBALTA) 30 MG capsule Take 1 capsule (30 mg total) by mouth at bedtime. 90 capsule 1  . ELIQUIS 5 MG TABS tablet TAKE ONE TABLET TWICE DAILY 180 tablet 1  . fluticasone (FLONASE) 50 MCG/ACT nasal spray Place 2 sprays into both nostrils at bedtime.    . fluticasone (FLOVENT HFA) 110 MCG/ACT inhaler Inhale 2 puffs into the lungs 2 (two) times daily as needed (bronchitis).     . Insulin Disposable Pump (V-GO 40) KIT Inject into the skin See admin instructions. Use with Novolog - 6 pumps before each meal    . Levothyroxine Sodium 150 MCG CAPS Take 1 capsule (150 mcg total) by mouth daily before breakfast. 30 capsule 3  . metoprolol succinate (TOPROL-XL) 25 MG 24 hr tablet Take 1 tablet (25 mg total) by mouth daily. 90 tablet 1  . NOVOLOG 100 UNIT/ML injection 100 units per day in VGO unknown bolus    . ondansetron (ZOFRAN ODT) 8 MG disintegrating tablet Take 1 tablet (8 mg total) by mouth every 8 (eight) hours as needed for nausea or vomiting. 20 tablet 0  . OVER THE COUNTER MEDICATION Take  0.5 tablets by mouth at bedtime. THC Candy from [place]    . oxyCODONE-acetaminophen (PERCOCET/ROXICET) 5-325 MG tablet Take 1 tablet by mouth every 6 (six) hours as needed for severe pain. 16 tablet 0  . Prenatal Vit-Fe Fumarate-FA (MULTIVITAMIN-PRENATAL) 27-0.8 MG TABS tablet Take 1 tablet by mouth daily. Takes for nail and hair growth     . ramipril (ALTACE) 2.5 MG capsule Take 1 capsule (2.5 mg total) at bedtime by mouth. 90 capsule 1  . torsemide (DEMADEX) 10 MG tablet Take 1 tablet (10 mg total) by mouth daily. 90 tablet 1  . Vitamin D, Ergocalciferol, (DRISDOL) 50000 units CAPS capsule Take 1 capsule (50,000 Units total) by mouth every 7 (seven) days. 8 capsule 0   Current Facility-Administered Medications on File Prior to Visit  Medication Dose Route Frequency Provider Last Rate Last Dose  . acetaminophen (TYLENOL) solution 650 mg  650 mg Oral Q6H PRN Angelia Mould, MD        BP (!) 159/66   Pulse 66   Temp 97.8 F (36.6 C) (Oral)   Resp 16   Ht 5' 2"  (1.575 m)   Wt 228 lb 3.2 oz (103.5 kg)   SpO2 99%   BMI 41.74 kg/m   Objective:   Physical Exam  General- No acute distress. Pleasant patient. Neck- Full range of motion, no jvd Lungs- Clear, even and unlabored. Heart- regular rate and rhythm. Neurologic- CNII- XII grossly intact.  Left lower  ext- calf is mild tender on palpation. Negative homan sign. Some decreased pulse sensation.  Feet do feel a little cold.  Decreased capillary refill.  But the toe and foot do not have necrotic appearance.  More pinkish color presently.  Left ankle- no direct tenderness to palpation. Left foot- great toes has loose nail. Thick discolored nail with bruising under the nail  Upper and lower ext strength- 3/5 all extremities.  Neurologic- CN III-XII grossly intact. Poor/unstable gait. Walks on balls/heels of feet. Shuffles.  Rt second toe- amputated toe.       Assessment & Plan:  I did fill out the face-to-face office examination guidelines to the best of my ability today.  We will fax this over to [place] specialist.  Hopefully this will help you getting of the appropriate power scoot/chair device that you need.(preferably scooter)  Follow-up as regularly scheduled.  40 minutes spent with patient today.  50% of time was spent talking with her about her various medical conditions and how they impact her activities of daily living.  Discussed these in depth as try to determine/justify the need for mobility device and fill out appropriate forms.  This is an addendum to exam in order to meet power wheelchair requirements.  I filled out the form on patient's date of service but the form also stated that  the note needed to help the same information.  So I am going to attach these points to the note.  Patient medical conditions that limit her ability to participate in mobility related activities of daily living are neuropathy, peripheral vascular disease, right second toe amputation, arthritis, gait difficulty, history of falls, COPD, CHF, history of knee replacement, and knee pain.  The ADLs that are impaired due to patient's mobility limitations are moving from room to room, dressing, bathing and showering.  In addition she has most difficulty simply walking and needs to use mobility  device/scooter in stores where she goes.  If she does not have a scooter available at a particular  store then she has difficulty with shopping due to poor ambulation and difficulty associated with above medical problems.  Patient states that she cannot use a cane or walker due to her legs giving out very easily.  She states that she has poor endurance and if she is standing in the kitchen for long periods legs will simply give out.  She indicates that she will be able to use a scooter in the house.  Also accessing the house with scooter will be easier per her report.  Patient expresses cannot use manual wheelchair due to poor coordination and hand pain.  Anticipate patient will be able to safely transfer in and out of scooter.  She does have some decreased grip strength in all extremities but should be able to operate a scooter out any problems.  She does have cognitive ability to operate scooter as well.  No physical or mental abilities that will inhibit her ability to operate the scooter.  She is also willing and able to use a scooter.    Mackie Pai, PA-C

## 2017-05-19 LAB — HEPATITIS C ANTIBODY
Hepatitis C Ab: NONREACTIVE
SIGNAL TO CUT-OFF: 0.05 (ref <1.00)

## 2017-05-23 ENCOUNTER — Encounter: Payer: Self-pay | Admitting: Medical

## 2017-05-23 ENCOUNTER — Telehealth: Payer: Self-pay | Admitting: Medical

## 2017-05-23 NOTE — Telephone Encounter (Signed)
Copied from [place] [id number]. Topic: Quick Communication - See Telephone Encounter >> May 23, 2017 11:47 AM Cleaster Corin, NT wrote: CRM for notification. See Telephone encounter for: 05/23/17.  Pt. Calling needing to speak with [facility] concerning paper work that was done on last Wednesday 05/18/17 Fax [place] attention Shanon Brow  please  call him at  [phone] with any questions

## 2017-05-24 ENCOUNTER — Encounter: Payer: Self-pay | Admitting: Medical

## 2017-05-24 ENCOUNTER — Telehealth: Payer: Self-pay | Admitting: Medical

## 2017-05-24 NOTE — Telephone Encounter (Signed)
I have also spoke with patient this afternoon regarding lab results and asked about her paperwork as well. Please advise.

## 2017-05-24 NOTE — Telephone Encounter (Signed)
Filled out face to face office forms and paperwork related to get getting power wheel chair. Would you review and make copy of 05-18-2017 note to attach.  Thanks

## 2017-05-24 NOTE — Telephone Encounter (Signed)
I sent her my chart message that I am working on final pages of the paper work.

## 2017-05-24 NOTE — Telephone Encounter (Signed)
Please advise 

## 2017-05-26 ENCOUNTER — Other Ambulatory Visit: Payer: Self-pay

## 2017-05-26 ENCOUNTER — Telehealth: Payer: Self-pay | Admitting: Medical

## 2017-05-26 MED ORDER — TORSEMIDE 10 MG PO TABS: 10.0000 mg | ORAL_TABLET | Freq: Every day | ORAL | 1 refills | Status: DC

## 2017-05-26 MED ORDER — METOPROLOL SUCCINATE ER 25 MG PO TB24: 25.0000 mg | ORAL_TABLET | Freq: Every day | ORAL | 1 refills | Status: DC

## 2017-05-26 NOTE — Telephone Encounter (Signed)
Copied from [place]. Topic: Quick Communication - Rx Refill/Question >> May 26, 2017  2:29 PM Lennox Solders wrote: Medication:metoprolol and torsemide . Pt has contacted pharm  [facility] in [place]  phone number [phone]. Pt is out of medications

## 2017-05-30 DIAGNOSIS — M2042 Other hammer toe(s) (acquired), left foot: Secondary | ICD-10-CM | POA: Diagnosis not present

## 2017-05-30 DIAGNOSIS — E782 Mixed hyperlipidemia: Secondary | ICD-10-CM | POA: Diagnosis not present

## 2017-05-30 DIAGNOSIS — I739 Peripheral vascular disease, unspecified: Secondary | ICD-10-CM | POA: Diagnosis not present

## 2017-05-30 DIAGNOSIS — B351 Tinea unguium: Secondary | ICD-10-CM | POA: Diagnosis not present

## 2017-05-30 DIAGNOSIS — E1142 Type 2 diabetes mellitus with diabetic polyneuropathy: Secondary | ICD-10-CM | POA: Diagnosis not present

## 2017-05-30 DIAGNOSIS — M2041 Other hammer toe(s) (acquired), right foot: Secondary | ICD-10-CM | POA: Diagnosis not present

## 2017-05-30 DIAGNOSIS — L84 Corns and callosities: Secondary | ICD-10-CM | POA: Diagnosis not present

## 2017-05-30 DIAGNOSIS — I10 Essential (primary) hypertension: Secondary | ICD-10-CM | POA: Diagnosis not present

## 2017-05-30 DIAGNOSIS — Z794 Long term (current) use of insulin: Secondary | ICD-10-CM | POA: Diagnosis not present

## 2017-05-30 DIAGNOSIS — Z89421 Acquired absence of other right toe(s): Secondary | ICD-10-CM | POA: Diagnosis not present

## 2017-06-01 ENCOUNTER — Ambulatory Visit (INDEPENDENT_AMBULATORY_CARE_PROVIDER_SITE_OTHER): Payer: Medicare HMO | Admitting: Medical

## 2017-06-01 ENCOUNTER — Other Ambulatory Visit: Payer: Self-pay

## 2017-06-01 ENCOUNTER — Encounter: Payer: Self-pay | Admitting: Medical

## 2017-06-01 VITALS — BP 147/67 | HR 74 | Temp 98.1°F | Resp 16 | Ht 62.0 in | Wt 224.8 lb

## 2017-06-01 DIAGNOSIS — J301 Allergic rhinitis due to pollen: Secondary | ICD-10-CM

## 2017-06-01 DIAGNOSIS — J3489 Other specified disorders of nose and nasal sinuses: Secondary | ICD-10-CM | POA: Diagnosis not present

## 2017-06-01 DIAGNOSIS — I739 Peripheral vascular disease, unspecified: Secondary | ICD-10-CM

## 2017-06-01 DIAGNOSIS — R062 Wheezing: Secondary | ICD-10-CM | POA: Diagnosis not present

## 2017-06-01 DIAGNOSIS — R69 Illness, unspecified: Secondary | ICD-10-CM | POA: Diagnosis not present

## 2017-06-01 MED ORDER — HYDROCODONE-HOMATROPINE 5-1.5 MG/5ML PO SYRP: 5.0000 mL | ORAL_SOLUTION | Freq: Three times a day (TID) | ORAL | 0 refills | Status: DC | PRN

## 2017-06-01 MED ORDER — AZITHROMYCIN 250 MG PO TABS: ORAL_TABLET | ORAL | 0 refills | Status: DC

## 2017-06-01 MED ORDER — LEVOCETIRIZINE DIHYDROCHLORIDE 5 MG PO TABS
5.0000 mg | ORAL_TABLET | Freq: Every evening | ORAL | 0 refills | Status: DC
Start: 2017-06-01 — End: 2018-05-10

## 2017-06-01 NOTE — Patient Instructions (Signed)
For allergic rhinitis please use both your astelin and flonase spray. Adding xyzal antihistamine.  For wheezing start back on your flovent daily. Use you albuterol inhaler. If using frequently please let me know.   For cough rx hycodan(you report prior use no reaction).   For sinus pressure will see how you respond to above. If sinus pain worsens then start azithromycin.  Follow up in 7-10 days or as needed

## 2017-06-01 NOTE — Progress Notes (Signed)
Subjective:    Patient ID: Alexis [name], female    DOB: Oct 09, 1950, 67 y.o.   MRN: [id number]  HPI  Pt in states recent mild nasal congestion and dry cough.   Pt states outdoors and exposed to pollen over weekend. Then next day had symptoms.  Some wheezing over last 24 hours. Pt used ventolin 2 puffs yesterday and it did help.  Pt states cough moderate to severe.  No fever, no chills, no sweats or bodyaches.  Some faint frontal sinus region pressure/ha.   Review of Systems  Constitutional: Negative for chills, fatigue and fever.  HENT: Positive for congestion, postnasal drip, sinus pressure and sore throat. Negative for ear pain.        Faint st  Respiratory: Positive for cough and wheezing. Negative for shortness of breath.   Cardiovascular: Negative for chest pain and palpitations.  Gastrointestinal: Negative for abdominal pain.  Musculoskeletal: Negative for back pain and myalgias.  Skin: Negative for rash.  Neurological: Negative for dizziness, syncope, speech difficulty, weakness and light-headedness.  Hematological: Negative for adenopathy. Does not bruise/bleed easily.  Psychiatric/Behavioral: Negative for behavioral problems and confusion.    Past Medical History:  Diagnosis Date  . Allergy   . Anxiety   . Arthritis   . Asthma   . Atrial fibrillation ([place])   . CHF (congestive heart failure) ([place])   . COPD (chronic obstructive pulmonary disease) ([place])   . Diabetes mellitus without complication ([place])   . Hyperlipidemia   . Hypertension   . Myocardial infarction ([place])    several  . Pacemaker    CRT-P with RV lead and His Bundle lead ( high threshold)   . Peripheral neuropathy   . Pneumonia   . PONV (postoperative nausea and vomiting)    unsure of exactly what happened at [facility] in 2010 (knee surgery) that led to crital care  . Thyroid disease      Social History   Socioeconomic History  . Marital status: Married    Spouse name: Not  on file  . Number of children: Not on file  . Years of education: Not on file  . Highest education level: Not on file  Occupational History  . Occupation: retired  Scientific laboratory technician  . Financial resource strain: Not on file  . Food insecurity:    Worry: Not on file    Inability: Not on file  . Transportation needs:    Medical: Not on file    Non-medical: Not on file  Tobacco Use  . Smoking status: Former Smoker    Last attempt to quit: 03/01/1998    Years since quitting: 19.2  . Smokeless tobacco: Never Used  Substance and Sexual Activity  . 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  . Drug use: Yes    Types: Marijuana    Comment: remote use  . Sexual activity: Never  Lifestyle  . Physical activity:    Days per week: Not on file    Minutes per session: Not on file  . Stress: Not on file  Relationships  . Social connections:    Talks on phone: Not on file    Gets together: Not on file    Attends religious service: Not on file    Active member of club or organization: Not on file    Attends meetings of clubs or organizations: Not on file  Relationship status: Not on file  . Intimate partner violence:    Fear of current or ex partner: Not on file    Emotionally abused: Not on file    Physically abused: Not on file    Forced sexual activity: Not on file  Other Topics Concern  . Not on file  Social History Narrative  . Not on file    Past Surgical History:  Procedure Laterality Date  . ABDOMINAL AORTOGRAM W/LOWER EXTREMITY N/A 04/29/2016   Procedure: Abdominal Aortogram w/Lower Extremity;  Surgeon: Waynetta Sandy, MD;  Location: [place] [facility];  Service: Cardiovascular;  Laterality: N/A;  . ABDOMINAL AORTOGRAM W/LOWER EXTREMITY N/A 05/06/2017   Procedure: ABDOMINAL AORTOGRAM W/LOWER EXTREMITY;  Surgeon: Angelia Mould, MD;  Location: [place] [facility];  Service: Cardiovascular;   Laterality: N/A;  bilateral  . ABDOMINAL HYSTERECTOMY    . AMPUTATION Right 07/30/2016   Procedure: AMPUTATION RIGHT SECOND TOE;  Surgeon: Angelia Mould, MD;  Location: [place];  Service: Vascular;  Laterality: Right;  . CARDIAC CATHETERIZATION     2005 at [facility]  . PERIPHERAL VASCULAR ATHERECTOMY Right 04/29/2016   Procedure: Peripheral Vascular Atherectomy-Right Popliteal;  Surgeon: Waynetta Sandy, MD;  Location: [place] [facility];  Service: Cardiovascular;  Laterality: Right;  . PERIPHERAL VASCULAR INTERVENTION Right 04/29/2016   Procedure: Peripheral Vascular Intervention-Right Popliteal;  Surgeon: Waynetta Sandy, MD;  Location: [place] [facility];  Service: Cardiovascular;  Laterality: Right;  POPLITEAL PTA  . RADIOFREQUENCY ABLATION    . TOTAL KNEE ARTHROPLASTY    . TUBAL LIGATION      Family History  Problem Relation Age of Onset  . Diabetes Mother   . Hyperlipidemia Mother   . Diabetes Father   . Hyperlipidemia Father   . Heart disease Father        before age 75  . Heart attack Father   . Diabetes Brother   . Hyperlipidemia Brother   . Heart attack Brother     Allergies  Allergen Reactions  . Indomethacin Other (See Comments)     Renal Insufficiency  . Pregabalin Other (See Comments)    DIZZINESS   . Sulfa Antibiotics Hives  . Morphine And Related Nausea And Vomiting    Current Outpatient Medications on File Prior to Visit  Medication Sig Dispense Refill  . acetaminophen (TYLENOL) 500 MG tablet Take 1,000 mg by mouth daily as needed for moderate pain.    Marland Kitchen albuterol (PROVENTIL HFA;VENTOLIN HFA) 108 (90 Base) MCG/ACT inhaler Inhale 2 puffs into the lungs every 6 (six) hours as needed for wheezing or shortness of breath. 1 Inhaler 2  . alendronate (FOSAMAX) 10 MG tablet Take 1 tablet (10 mg total) by mouth daily before breakfast. Take with a full glass of water on an empty stomach. 30 tablet 11  . allopurinol (ZYLOPRIM) 100 MG  tablet Take 1 tablet (100 mg total) by mouth daily. (Patient taking differently: Take 100 mg by mouth 2 (two) times daily. ) 90 tablet 2  . AMBULATORY NON FORMULARY MEDICATION Motorized scooter.  Diagnosis: Osteoarthritis M19.90 1 each 0  . Blood Glucose Monitoring Suppl (GLUCOCOM BLOOD GLUCOSE MONITOR) DEVI Use to check blood sugars 6 times daily E11.65    . DULoxetine (CYMBALTA) 30 MG capsule Take 1 capsule (30 mg total) by mouth at bedtime. 90 capsule 1  . ELIQUIS 5 MG TABS tablet TAKE ONE TABLET TWICE DAILY 180 tablet 1  . fluticasone (FLONASE) 50 MCG/ACT nasal  spray Place 2 sprays into both nostrils at bedtime.    . fluticasone (FLOVENT HFA) 110 MCG/ACT inhaler Inhale 2 puffs into the lungs 2 (two) times daily as needed (bronchitis).    . Insulin Disposable Pump (V-GO 40) KIT Inject into the skin See admin instructions. Use with Novolog - 6 pumps before each meal    . Levothyroxine Sodium 150 MCG CAPS Take 1 capsule (150 mcg total) by mouth daily before breakfast. 30 capsule 3  . metoprolol succinate (TOPROL-XL) 25 MG 24 hr tablet Take 1 tablet (25 mg total) by mouth daily. 90 tablet 1  . NOVOLOG 100 UNIT/ML injection 100 units per day in VGO unknown bolus    . ondansetron (ZOFRAN ODT) 8 MG disintegrating tablet Take 1 tablet (8 mg total) by mouth every 8 (eight) hours as needed for nausea or vomiting. 20 tablet 0  . OVER THE COUNTER MEDICATION Take 0.5 tablets by mouth at bedtime. THC Candy from [place]    . oxyCODONE-acetaminophen (PERCOCET/ROXICET) 5-325 MG tablet Take 1 tablet by mouth every 6 (six) hours as needed for severe pain. 16 tablet 0  . Prenatal Vit-Fe Fumarate-FA (MULTIVITAMIN-PRENATAL) 27-0.8 MG TABS tablet Take 1 tablet by mouth daily. Takes for nail and hair growth     . ramipril (ALTACE) 2.5 MG capsule Take 1 capsule (2.5 mg total) at bedtime by mouth. 90 capsule 1  . Semaglutide 0.25 or 0.5 MG/DOSE SOPN Inject 0.61m weekly    . torsemide (DEMADEX) 10 MG tablet Take 1  tablet (10 mg total) by mouth daily. 90 tablet 1  . Vitamin D, Ergocalciferol, (DRISDOL) 50000 units CAPS capsule Take 1 capsule (50,000 Units total) by mouth every 7 (seven) days. 8 capsule 0   Current Facility-Administered Medications on File Prior to Visit  Medication Dose Route Frequency Provider Last Rate Last Dose  . acetaminophen (TYLENOL) solution 650 mg  650 mg Oral Q6H PRN DAngelia Mould MD        BP (!) 147/67   Pulse 74   Temp 98.1 F (36.7 C) (Oral)   Resp 16   Ht 5' 2" (1.575 m)   Wt 224 lb 12.8 oz (102 kg)   SpO2 100%   BMI 41.12 kg/m       Objective:   Physical Exam   General  Mental Status - Alert. General Appearance - Well groomed. Not in acute distress.  Skin Rashes- No Rashes.  HEENT Head- Normal. Ear Auditory Canal - Left- Normal. Right - Normal.Tympanic Membrane- Left- Normal. Right- Normal. Eye Sclera/Conjunctiva- Left- Normal. Right- Normal. Nose & Sinuses Nasal Mucosa- Left-  Boggy and Congested. Right-  Boggy and  Congested.Bilateral maxillary faint   And mild frontal sinus pressure. Mouth & Throat Lips: Upper Lip- Normal: no dryness, cracking, pallor, cyanosis, or vesicular eruption. Lower Lip-Normal: no dryness, cracking, pallor, cyanosis or vesicular eruption. Buccal Mucosa- Bilateral- No Aphthous ulcers. Oropharynx- No Discharge or Erythema. Tonsils: Characteristics- Bilateral- No Erythema or Congestion. Size/Enlargement- Bilateral- No enlargement. Discharge- bilateral-None.  Neck Neck- Supple. No Masses.   Chest and Lung Exam Auscultation: Breath Sounds:-Clear even and unlabored.  Cardiovascular Auscultation:Rythm- Regular, rate and rhythm. Murmurs & Other Heart Sounds:Ausculatation of the heart reveal- No Murmurs.  Lymphatic Head & Neck General Head & Neck Lymphatics: Bilateral: Description- No Localized lymphadenopathy.      Assessment & Plan:  For allergic rhinitis please use both your astelin and flonase  spray. Adding xyzal antihistamine.  For wheezing start back on your flovent daily. Use you  albuterol inhaler. If using frequently please let me know.   For cough rx hycodan(you report prior use no reaction).   For sinus pressure will see how you respond to above. If sinus pain worsens then start azithromycin.  Follow up in 7-10 days or as needed  [employer]

## 2017-06-02 ENCOUNTER — Telehealth: Payer: Self-pay | Admitting: *Deleted

## 2017-06-02 NOTE — Telephone Encounter (Signed)
Received Physician Orders from [facility], stating that 'incorrect request sent for [facility], pt wants Scooter, please complete forms and send signed notes documenting need; forwarded to provider/SLS 04/04

## 2017-06-03 NOTE — Telephone Encounter (Signed)
Note attached and paperwork faxed/SLS

## 2017-06-13 ENCOUNTER — Encounter: Payer: Self-pay | Admitting: Medical

## 2017-06-14 ENCOUNTER — Ambulatory Visit (HOSPITAL_COMMUNITY)
Admission: RE | Admit: 2017-06-14 | Discharge: 2017-06-14 | Disposition: A | Discharge status: Home / Self Care | Payer: Medicare HMO | Source: Ambulatory Visit | Attending: Vascular Surgery | Admitting: Vascular Surgery

## 2017-06-14 ENCOUNTER — Telehealth: Payer: Self-pay | Admitting: Medical

## 2017-06-14 DIAGNOSIS — E1151 Type 2 diabetes mellitus with diabetic peripheral angiopathy without gangrene: Secondary | ICD-10-CM | POA: Insufficient documentation

## 2017-06-14 DIAGNOSIS — I10 Essential (primary) hypertension: Secondary | ICD-10-CM | POA: Diagnosis not present

## 2017-06-14 DIAGNOSIS — I739 Peripheral vascular disease, unspecified: Secondary | ICD-10-CM

## 2017-06-14 DIAGNOSIS — I2510 Atherosclerotic heart disease of native coronary artery without angina pectoris: Secondary | ICD-10-CM | POA: Diagnosis not present

## 2017-06-14 DIAGNOSIS — E785 Hyperlipidemia, unspecified: Secondary | ICD-10-CM | POA: Diagnosis not present

## 2017-06-14 NOTE — Telephone Encounter (Signed)
I have the medical scooter pov face-to face office examination sheet guidelines and have already addressed every point on the sheet. Do you have copy of the forms I asked to fax just recently. I answered every question. So I would like to review those papers.  Not sure why [facility] is asking me to fill this out again.  This will be about my 4th attempt to get her power scooter.   I am not going to fill this out without comparing it to last form I filled out to see what is the difference.(they said incorrect sent for power wheel chair). She wants scooter? Are forms that different,??  Last visit was dedicated to filling out paperwork. 30 minute appointment attempting to dot every I and cross every T.  Let me see those papers filled out on last visit.  Also will you let patient now running into problems again with paperwork.   Thanks for your hellp.

## 2017-06-14 NOTE — Telephone Encounter (Signed)
Opened to review power chair/scooter office visit.

## 2017-06-15 ENCOUNTER — Ambulatory Visit (INDEPENDENT_AMBULATORY_CARE_PROVIDER_SITE_OTHER): Payer: Medicare HMO | Admitting: Vascular Surgery

## 2017-06-15 ENCOUNTER — Encounter: Payer: Self-pay | Admitting: Medical

## 2017-06-15 ENCOUNTER — Encounter: Payer: Self-pay | Admitting: Vascular Surgery

## 2017-06-15 VITALS — BP 152/85 | HR 75 | Temp 97.1°F | Resp 16 | Ht 62.0 in | Wt 222.0 lb

## 2017-06-15 DIAGNOSIS — I739 Peripheral vascular disease, unspecified: Secondary | ICD-10-CM

## 2017-06-15 NOTE — Telephone Encounter (Signed)
I will check to see if I have a copy/SLS 04/17

## 2017-06-15 NOTE — Progress Notes (Signed)
Patient name: JYRAH BLYE MRN: [id number] DOB: [birth year] Sex: female  REASON FOR VISIT:   Follow-up of peripheral vascular disease.  HPI:   Alexis [name] is a pleasant 67 y.o. female who had originally seen with claudication in the left lower extremity.  She wished to pursue arteriography.  She underwent an arteriogram on 05/06/2017.  This showed that on the left side, which is the symptomatic side, the common femoral and deep femoral artery were patent.  The superficial femoral artery had some disease proximally with a tight 95% stenosis in the distal superficial femoral artery but also disease below that with an eccentric plaque in the popliteal artery at the level of the knee which was calcified.  There was two-vessel runoff on the left via the anterior tibial and peroneal arteries.  The posterior tibial artery was occluded.  Given that I did not think the patient was a good candidate for endovascular approach.  The situation was further complicated in that she had a high bifurcation of the common femoral artery on the right and therefore she could not have a contralateral approach to address her infrainguinal disease on the left.  If she developed critical limb ischemia she would require a left femoral to below-knee popliteal artery bypass which would be associated with increased risk given her diabetes and obesity.  She comes in for a 6-week follow-up visit.  Since I saw her last, she is on a new medication for diabetes and has lost some weight.  Her blood sugar seems to be under better control.  She has stable claudication of the left lower extremity involves her calf.  She has no significant symptoms on the right.  She denies any history of rest pain or nonhealing ulcers.  She is not a smoker.  Past Medical History:  Diagnosis Date  . Allergy   . Anxiety   . Arthritis   . Asthma   . Atrial fibrillation ([place])   . CHF (congestive heart failure) ([place])   . COPD (chronic obstructive  pulmonary disease) ([place])   . Diabetes mellitus without complication ([place])   . Hyperlipidemia   . Hypertension   . Myocardial infarction ([place])    several  . Pacemaker    CRT-P with RV lead and His Bundle lead ( high threshold)   . Peripheral neuropathy   . Pneumonia   . PONV (postoperative nausea and vomiting)    unsure of exactly what happened at [facility] in 2010 (knee surgery) that led to crital care  . Thyroid disease     Family History  Problem Relation Age of Onset  . Diabetes Mother   . Hyperlipidemia Mother   . Diabetes Father   . Hyperlipidemia Father   . Heart disease Father        before age 19  . Heart attack Father   . Diabetes Brother   . Hyperlipidemia Brother   . Heart attack Brother     SOCIAL HISTORY: Social History   Tobacco Use  . Smoking status: Former Smoker    Last attempt to quit: 03/01/1998    Years since quitting: 19.3  . Smokeless tobacco: Never Used  Substance Use Topics  . 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    Allergies  Allergen Reactions  . Indomethacin Other (See Comments)     Renal Insufficiency  . Pregabalin Other (See Comments)  DIZZINESS   . Sulfa Antibiotics Hives  . Morphine And Related Nausea And Vomiting    Current Outpatient Medications  Medication Sig Dispense Refill  . acetaminophen (TYLENOL) 500 MG tablet Take 1,000 mg by mouth daily as needed for moderate pain.    Marland Kitchen albuterol (PROVENTIL HFA;VENTOLIN HFA) 108 (90 Base) MCG/ACT inhaler Inhale 2 puffs into the lungs every 6 (six) hours as needed for wheezing or shortness of breath. 1 Inhaler 2  . alendronate (FOSAMAX) 10 MG tablet Take 1 tablet (10 mg total) by mouth daily before breakfast. Take with a full glass of water on an empty stomach. 30 tablet 11  . allopurinol (ZYLOPRIM) 100 MG tablet Take 1 tablet (100 mg total) by mouth daily. (Patient taking differently: Take 100 mg by  mouth 2 (two) times daily. ) 90 tablet 2  . AMBULATORY NON FORMULARY MEDICATION Motorized scooter.  Diagnosis: Osteoarthritis M19.90 1 each 0  . azithromycin (ZITHROMAX) 250 MG tablet Take 2 tablets by mouth on day 1, followed by 1 tablet by mouth daily for 4 days. 6 tablet 0  . Blood Glucose Monitoring Suppl (GLUCOCOM BLOOD GLUCOSE MONITOR) DEVI Use to check blood sugars 6 times daily E11.65    . DULoxetine (CYMBALTA) 30 MG capsule Take 1 capsule (30 mg total) by mouth at bedtime. 90 capsule 1  . ELIQUIS 5 MG TABS tablet TAKE ONE TABLET TWICE DAILY 180 tablet 1  . fluticasone (FLONASE) 50 MCG/ACT nasal spray Place 2 sprays into both nostrils at bedtime.    . fluticasone (FLOVENT HFA) 110 MCG/ACT inhaler Inhale 2 puffs into the lungs 2 (two) times daily as needed (bronchitis).    Marland Kitchen HYDROcodone-homatropine (HYCODAN) 5-1.5 MG/5ML syrup Take 5 mLs by mouth every 8 (eight) hours as needed. 100 mL 0  . Insulin Disposable Pump (V-GO 40) KIT Inject into the skin See admin instructions. Use with Novolog - 6 pumps before each meal    . levocetirizine (XYZAL) 5 MG tablet Take 1 tablet (5 mg total) by mouth every evening. 30 tablet 0  . Levothyroxine Sodium 150 MCG CAPS Take 1 capsule (150 mcg total) by mouth daily before breakfast. 30 capsule 3  . metoprolol succinate (TOPROL-XL) 25 MG 24 hr tablet Take 1 tablet (25 mg total) by mouth daily. 90 tablet 1  . NOVOLOG 100 UNIT/ML injection 100 units per day in VGO unknown bolus    . ondansetron (ZOFRAN ODT) 8 MG disintegrating tablet Take 1 tablet (8 mg total) by mouth every 8 (eight) hours as needed for nausea or vomiting. 20 tablet 0  . OVER THE COUNTER MEDICATION Take 0.5 tablets by mouth at bedtime. THC Candy from [place]    . oxyCODONE-acetaminophen (PERCOCET/ROXICET) 5-325 MG tablet Take 1 tablet by mouth every 6 (six) hours as needed for severe pain. 16 tablet 0  . Prenatal Vit-Fe Fumarate-FA (MULTIVITAMIN-PRENATAL) 27-0.8 MG TABS tablet Take 1 tablet  by mouth daily. Takes for nail and hair growth     . ramipril (ALTACE) 2.5 MG capsule Take 1 capsule (2.5 mg total) at bedtime by mouth. 90 capsule 1  . Semaglutide 0.25 or 0.5 MG/DOSE SOPN Inject 0.51m weekly    . torsemide (DEMADEX) 10 MG tablet Take 1 tablet (10 mg total) by mouth daily. 90 tablet 1  . Vitamin D, Ergocalciferol, (DRISDOL) 50000 units CAPS capsule Take 1 capsule (50,000 Units total) by mouth every 7 (seven) days. 8 capsule 0   Current Facility-Administered Medications  Medication Dose Route Frequency Provider Last Rate Last  Dose  . acetaminophen (TYLENOL) solution 650 mg  650 mg Oral Q6H PRN Angelia Mould, MD        REVIEW OF SYSTEMS:  [X]  denotes positive finding, [ ]  denotes negative finding Cardiac  Comments:  Chest pain or chest pressure:    Shortness of breath upon exertion:    Short of breath when lying flat:    Irregular heart rhythm:        Vascular    Pain in calf, thigh, or hip brought on by ambulation: x   Pain in feet at night that wakes you up from your sleep:     Blood clot in your veins:    Leg swelling:         Pulmonary    Oxygen at home:    Productive cough:     Wheezing:         Neurologic    Sudden weakness in arms or legs:     Sudden numbness in arms or legs:     Sudden onset of difficulty speaking or slurred speech:    Temporary loss of vision in one eye:     Problems with dizziness:         Gastrointestinal    Blood in stool:     Vomited blood:         Genitourinary    Burning when urinating:     Blood in urine:        Psychiatric    Major depression:         Hematologic    Bleeding problems:    Problems with blood clotting too easily:        Skin    Rashes or ulcers:        Constitutional    Fever or chills:     PHYSICAL EXAM:   Vitals:   06/15/17 1353  BP: (!) 152/85  Pulse: 75  Resp: 16  Temp: (!) 97.1 F (36.2 C)  TempSrc: Oral  SpO2: 94%  Weight: 222 lb (100.7 kg)  Height: 5' 2"  (1.575 m)     GENERAL: The patient is a well-nourished female, in no acute distress. The vital signs are documented above. CARDIAC: There is a regular rate and rhythm.  VASCULAR: I do not detect carotid bruits. Because of her large pannus it is difficult to palpate femoral pulses. I cannot palpate popliteal or pedal pulses. PULMONARY: There is good air exchange bilaterally without wheezing or rales. ABDOMEN: Soft and non-tender with normal pitched bowel sounds.  MUSCULOSKELETAL: She has had a previous right second toe amputation. NEUROLOGIC: No focal weakness or paresthesias are detected. SKIN: There are no ulcers or rashes noted. PSYCHIATRIC: The patient has a normal affect.  DATA:    ARTERIOGRAM: I reviewed her previous arteriogram which shows disease in the distal superficial femoral artery and popliteal artery.  For the reasons described above she is not a good candidate for endovascular approach.  She could potentially be a candidate for a femoral to below-knee pop bypass.  MEDICAL ISSUES:   PERIPHERAL VASCULAR DISEASE: Patient has stable claudication in the left lower extremity.  I would not recommend left femoral to below-knee popliteal artery bypass grafting unless she develops critical limb ischemia.  I think she would be at very high risk for wound healing problems in the groin given her large pannus and obesity with diabetes.  I have encouraged her to stay as active as possible.  Fortunately she quit smoking in 2000.  I  will see her back in 1 year with follow-up ABIs.  She knows to call sooner if she has problems.  [name] [facility] [phone]

## 2017-06-16 ENCOUNTER — Encounter: Payer: Self-pay | Admitting: Medical

## 2017-06-16 MED ORDER — ALLOPURINOL 100 MG PO TABS: 100.0000 mg | ORAL_TABLET | Freq: Two times a day (BID) | ORAL | 3 refills | Status: DC

## 2017-06-20 DIAGNOSIS — L601 Onycholysis: Secondary | ICD-10-CM | POA: Diagnosis not present

## 2017-06-20 DIAGNOSIS — E1142 Type 2 diabetes mellitus with diabetic polyneuropathy: Secondary | ICD-10-CM | POA: Diagnosis not present

## 2017-06-27 ENCOUNTER — Encounter: Payer: Self-pay | Admitting: Medical

## 2017-06-28 ENCOUNTER — Telehealth: Payer: Self-pay | Admitting: Medical

## 2017-06-28 NOTE — Telephone Encounter (Signed)
I saw patients when I open up her chart to fill out her mobility scooter form(that my not filling out her form was holding up process).  But her message is not sent to me directly?  Trying to figure out why?  But I did fill out the mobility scooter form again.    Please let patient know that unfortunately this will be the last time am filling out a complete form.  This makes about the fourth or fifth attempt in trying to get her mobility device.  It took me about 30 minutes to fill out this form again on Tuesday afternoon when I was not in the office.  Last time specifically schedule her 30-minute appointment so we can go over the form in detail.  I was given a mobility wheelchair form rather than the mobility scooter form.  So please submit the paperwork and will see what comes of it.  I am willing to make some addendum to the form and addendum last note if needed(or get Dr. Etter Sjogren signature if needed).  However I feel like if she does not qualify with this most recent paperwork and addended note that I cannot help her any further.  This would be unfortunate so really hope that this qualifies her for a scooter. This makes attempt number 4 or 5 trying to get her scooter approved

## 2017-06-29 NOTE — Telephone Encounter (Signed)
Per conversation with provider, he does not want patient to have another visit; he has completed the new forms and they are ready to be faxed, I will inform the patient of this.  As far as the message [not] received, it was a My Chart message from patient that she sent to you, but another team member also routed it to me because it pertained to paperwork. Copy & Paste: Trudi, [place]  to Mackie Pai, Hershal Coria      06/27/17 12:48 PM  I just received a call from Shanon Brow at [employer]. They have not got my information from you. This is holding up the progress from me getting this scooter. Please send asap. Thanks in advance for your help.

## 2017-07-01 ENCOUNTER — Telehealth: Payer: Self-pay | Admitting: *Deleted

## 2017-07-01 ENCOUNTER — Ambulatory Visit: Payer: Medicare HMO | Admitting: *Deleted

## 2017-07-01 NOTE — Telephone Encounter (Signed)
Received a Pre-certification Information Request form that is required by pt's insurance; forwarded to provider/SLS 05/03

## 2017-07-05 ENCOUNTER — Encounter: Payer: Self-pay | Admitting: Medical

## 2017-07-10 ENCOUNTER — Encounter: Payer: Self-pay | Admitting: Medical

## 2017-07-10 ENCOUNTER — Telehealth: Payer: Self-pay | Admitting: Medical

## 2017-07-10 NOTE — Telephone Encounter (Signed)
I was reviewing patient's forms that we can.  They are asking questions that I have already answered.  So I will need the last forms that are already filled out.  Most recent.  I need to do so my information will match as I cannot remember all the details.  I need to know what activities of daily living the mobile device will help.  She explained this to me not wrote it on the form.  Also I need to know how many feet she can ambulate with and without assistance device.  This is a new question.  Also there is some information about has she been shown how to safely operate mobility device.   She might need another visit and she might even need and I will need to talk to [facility].  Can you give me a copy of the most recent form I filled out which is for the mobility scooter.

## 2017-07-11 ENCOUNTER — Telehealth: Payer: Self-pay | Admitting: Medical

## 2017-07-11 MED ORDER — RAMIPRIL 2.5 MG PO CAPS: 2.5000 mg | ORAL_CAPSULE | Freq: Every day | ORAL | 1 refills | Status: DC

## 2017-07-11 NOTE — Telephone Encounter (Signed)
Talked with patient today and gave her update on waiting on call back from [facility].

## 2017-07-11 NOTE — Telephone Encounter (Signed)
I called [employer] about getting help/clarification on recent forms filled out. Gave office number and my cell.  Will you get copy of the last forms I filled out for the scooter. Need this to answer duplicate questions the same. Also don't remember the answer to those questions.

## 2017-07-12 ENCOUNTER — Telehealth: Payer: Self-pay | Admitting: Medical

## 2017-07-12 NOTE — Telephone Encounter (Signed)
Alexis [name] with [facility] returning Alexis [name] call to leave a phone number where he can be reached as he does not have Alexis [name] cell number. Alexis [name]'s CB# [phone]. He will be in the office until 5. Alexis [name]'s Cell for after 5 [phone]

## 2017-07-12 NOTE — Telephone Encounter (Signed)
Just finished last form or scooter paperwork. Will give to you tomorrow and you can fax in.

## 2017-07-12 NOTE — Telephone Encounter (Signed)
On your desk/SLS 05/14

## 2017-08-08 ENCOUNTER — Other Ambulatory Visit: Payer: Self-pay | Admitting: Cardiology

## 2017-08-08 DIAGNOSIS — R69 Illness, unspecified: Secondary | ICD-10-CM | POA: Diagnosis not present

## 2017-08-18 ENCOUNTER — Encounter: Payer: Self-pay | Admitting: Medical

## 2017-08-26 DIAGNOSIS — J449 Chronic obstructive pulmonary disease, unspecified: Secondary | ICD-10-CM | POA: Diagnosis not present

## 2017-08-26 DIAGNOSIS — R269 Unspecified abnormalities of gait and mobility: Secondary | ICD-10-CM | POA: Diagnosis not present

## 2017-08-26 DIAGNOSIS — G629 Polyneuropathy, unspecified: Secondary | ICD-10-CM | POA: Diagnosis not present

## 2017-08-26 DIAGNOSIS — Z8679 Personal history of other diseases of the circulatory system: Secondary | ICD-10-CM | POA: Diagnosis not present

## 2017-08-31 DIAGNOSIS — E1142 Type 2 diabetes mellitus with diabetic polyneuropathy: Secondary | ICD-10-CM | POA: Diagnosis not present

## 2017-09-13 DIAGNOSIS — E782 Mixed hyperlipidemia: Secondary | ICD-10-CM | POA: Diagnosis not present

## 2017-09-13 DIAGNOSIS — I10 Essential (primary) hypertension: Secondary | ICD-10-CM | POA: Diagnosis not present

## 2017-09-13 DIAGNOSIS — R945 Abnormal results of liver function studies: Secondary | ICD-10-CM | POA: Diagnosis not present

## 2017-09-13 DIAGNOSIS — Z794 Long term (current) use of insulin: Secondary | ICD-10-CM | POA: Diagnosis not present

## 2017-09-13 DIAGNOSIS — Z9641 Presence of insulin pump (external) (internal): Secondary | ICD-10-CM | POA: Diagnosis not present

## 2017-09-13 DIAGNOSIS — E1142 Type 2 diabetes mellitus with diabetic polyneuropathy: Secondary | ICD-10-CM | POA: Diagnosis not present

## 2017-09-20 ENCOUNTER — Telehealth: Payer: Self-pay

## 2017-09-20 DIAGNOSIS — E039 Hypothyroidism, unspecified: Secondary | ICD-10-CM

## 2017-09-20 MED ORDER — LEVOTHYROXINE SODIUM 150 MCG PO CAPS: 1.0000 | ORAL_CAPSULE | Freq: Every day | ORAL | 3 refills | Status: DC

## 2017-09-20 NOTE — Telephone Encounter (Signed)
[place] with refill thyroid med. But will you ask her to follow up in October.

## 2017-09-20 NOTE — Telephone Encounter (Signed)
Paper rx request for synthroid received, and filled per protocol. Pt. LOV 05/2017, but last TSH drawn 03/2017. Routed to [place] to advise.

## 2017-09-21 NOTE — Telephone Encounter (Signed)
Synthroid refilled 7/23. Author phoned pt. to make October appointment per [facility] request. No answer, detailed VM left to call back to schedule [phone].

## 2017-09-27 DIAGNOSIS — H2513 Age-related nuclear cataract, bilateral: Secondary | ICD-10-CM | POA: Diagnosis not present

## 2017-09-27 DIAGNOSIS — H35353 Cystoid macular degeneration, bilateral: Secondary | ICD-10-CM | POA: Diagnosis not present

## 2017-10-04 DIAGNOSIS — Z01818 Encounter for other preprocedural examination: Secondary | ICD-10-CM | POA: Diagnosis not present

## 2017-10-04 DIAGNOSIS — H2511 Age-related nuclear cataract, right eye: Secondary | ICD-10-CM | POA: Diagnosis not present

## 2017-10-04 DIAGNOSIS — H43821 Vitreomacular adhesion, right eye: Secondary | ICD-10-CM | POA: Diagnosis not present

## 2017-10-06 ENCOUNTER — Telehealth: Payer: Self-pay | Admitting: *Deleted

## 2017-10-06 NOTE — Telephone Encounter (Signed)
Received Medical/Surgical Clearance Form from [facility] stating that pt is scheduled for Cataract removal with IV sedation on Mon. 10/10/17; called sender and informed that provider would not RTO until, Mon, 10/10/17 and that we would also not know if he would require patient to come in to office for visit prior to clearance until Monday when he returns; they will inform patient that he surgery needs to be rescheduled; forwarded to provider/SLS 08/08

## 2017-10-10 DIAGNOSIS — R69 Illness, unspecified: Secondary | ICD-10-CM | POA: Diagnosis not present

## 2017-10-11 NOTE — Telephone Encounter (Signed)
[employer] calling for a update for the patients surgical clearance:  Contact [phone]

## 2017-10-11 NOTE — Telephone Encounter (Signed)
Have her schedule appointment for 1 pm on Monday. She does have multiple co morbidities. Diabetes, pvd, atrial fibrillation. Looks like has not seen endocrinologist in a while and looks like no recent cardiologist appointment. So please get her scheduled with me. Then will determine if she needs to see cardiologist.  So update patient and opthalmologist office. I have not been in town for one week. Just saw this surgical clearance request yesterday morning. This afternoon  was first chance to evaluate request and review pt chart.

## 2017-10-17 ENCOUNTER — Encounter: Payer: Self-pay | Admitting: Medical

## 2017-10-17 ENCOUNTER — Ambulatory Visit (INDEPENDENT_AMBULATORY_CARE_PROVIDER_SITE_OTHER): Payer: Medicare HMO | Admitting: Medical

## 2017-10-17 VITALS — BP 137/80 | HR 64 | Temp 97.8°F | Resp 16 | Ht 62.0 in | Wt 216.8 lb

## 2017-10-17 DIAGNOSIS — Z01818 Encounter for other preprocedural examination: Secondary | ICD-10-CM

## 2017-10-17 DIAGNOSIS — E119 Type 2 diabetes mellitus without complications: Secondary | ICD-10-CM

## 2017-10-17 DIAGNOSIS — I739 Peripheral vascular disease, unspecified: Secondary | ICD-10-CM | POA: Diagnosis not present

## 2017-10-17 DIAGNOSIS — I10 Essential (primary) hypertension: Secondary | ICD-10-CM | POA: Diagnosis not present

## 2017-10-17 NOTE — Patient Instructions (Signed)
Your chronic conditions appear to be controlled. No cardiac or neurologic signs or symptoms. Your heart on ausculation sounds in NSR today. Since chronic conditions stable and your upcoming cataract procedure is under versed sedation but not general anesthesia, I do think safe to proceed with procedure. Continue chronic meds.   Filled out your pre-op form today.  Keep appointment with cardiologist next month.  Follow up with me in 3 months or as needed

## 2017-10-17 NOTE — Progress Notes (Signed)
Subjective:    Patient ID: Alexis [name], female    DOB: May 02, 1950, 67 y.o.   MRN: [id number]  HPI  Pt in for evaluation.  Pt is set to have eye surgery for cataracts.   I wanted her to come in for check up. I wanted to see her to evaluate if interval worsening of chronic conditions or new symptoms.  Pt reports no current neurologic or cardiologist signs or symptoms. No chest pain or palpitations. Pt does have pacemaker. She has appointment with cardiologist upstairs on sept 18,2019.   Pt will be sedated during the procedure but she won't need general anesthesia.(it appears will be versed)  Pt states sugars have improved. a1-c in 6 range now. She is loosing weight with ozempic.   She has pvd and has been doing some water aeorobics.     Review of Systems  Constitutional: Negative for chills, fatigue and fever.  Respiratory: Negative for cough, chest tightness, shortness of breath and wheezing.   Cardiovascular: Negative for chest pain and palpitations.  Gastrointestinal: Negative for abdominal pain, constipation, diarrhea, nausea and vomiting.  Musculoskeletal: Negative for back pain.  Neurological: Negative for dizziness and headaches.  Hematological: Negative for adenopathy. Does not bruise/bleed easily.  Psychiatric/Behavioral: Negative for behavioral problems and dysphoric mood. The patient is not nervous/anxious.     Past Medical History:  Diagnosis Date  . Allergy   . Anxiety   . Arthritis   . Asthma   . Atrial fibrillation ([place])   . CHF (congestive heart failure) ([place])   . COPD (chronic obstructive pulmonary disease) ([employer])   . Diabetes mellitus without complication ([place])   . Hyperlipidemia   . Hypertension   . Myocardial infarction ([place])    several  . Pacemaker    CRT-P with RV lead and His Bundle lead ( high threshold)   . Peripheral neuropathy   . Pneumonia   . PONV (postoperative nausea and vomiting)    unsure of exactly what happened at [facility] in 2010 (knee surgery) that led to crital care  . Thyroid disease      Social History   Socioeconomic History  . Marital status: Married    Spouse name: Not on file  . Number of children: Not on file  . Years of education: Not on file  . Highest education level: Not on file  Occupational History  . Occupation: retired  Scientific laboratory technician  . Financial resource strain: Not on file  . Food insecurity:    Worry: Not on file    Inability: Not on file  . Transportation needs:    Medical: Not on file    Non-medical: Not on file  Tobacco Use  . Smoking status: Former Smoker    Last attempt to quit: 03/01/1998    Years since quitting: 19.6  . Smokeless tobacco: Never Used  Substance and Sexual Activity  . 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  . Drug use: Yes    Types: Marijuana    Comment: remote use  . Sexual activity: Never  Lifestyle  . Physical activity:    Days per week: Not on file    Minutes per session: Not on file  . Stress: Not on file  Relationships  . Social connections:    Talks on phone: Not on file    Gets together: Not on file    Attends  religious service: Not on file    Active member of club or organization: Not on file    Attends meetings of clubs or organizations: Not on file    Relationship status: Not on file  . Intimate partner violence:    Fear of current or ex partner: Not on file    Emotionally abused: Not on file    Physically abused: Not on file    Forced sexual activity: Not on file  Other Topics Concern  . Not on file  Social History Narrative  . Not on file    Past Surgical History:  Procedure Laterality Date  . ABDOMINAL AORTOGRAM W/LOWER EXTREMITY N/A 04/29/2016   Procedure: Abdominal Aortogram w/Lower Extremity;  Surgeon: Waynetta Sandy, MD;  Location: [facility];  Service: Cardiovascular;  Laterality: N/A;  . ABDOMINAL AORTOGRAM  W/LOWER EXTREMITY N/A 05/06/2017   Procedure: ABDOMINAL AORTOGRAM W/LOWER EXTREMITY;  Surgeon: Angelia Mould, MD;  Location: [place] [facility];  Service: Cardiovascular;  Laterality: N/A;  bilateral  . ABDOMINAL HYSTERECTOMY    . AMPUTATION Right 07/30/2016   Procedure: AMPUTATION RIGHT SECOND TOE;  Surgeon: Angelia Mould, MD;  Location: [facility];  Service: Vascular;  Laterality: Right;  . CARDIAC CATHETERIZATION     2005 at [facility]  . PERIPHERAL VASCULAR ATHERECTOMY Right 04/29/2016   Procedure: Peripheral Vascular Atherectomy-Right Popliteal;  Surgeon: Waynetta Sandy, MD;  Location: [place] [facility];  Service: Cardiovascular;  Laterality: Right;  . PERIPHERAL VASCULAR INTERVENTION Right 04/29/2016   Procedure: Peripheral Vascular Intervention-Right Popliteal;  Surgeon: Waynetta Sandy, MD;  Location: [facility];  Service: Cardiovascular;  Laterality: Right;  POPLITEAL PTA  . RADIOFREQUENCY ABLATION    . TOTAL KNEE ARTHROPLASTY    . TUBAL LIGATION      Family History  Problem Relation Age of Onset  . Diabetes Mother   . Hyperlipidemia Mother   . Diabetes Father   . Hyperlipidemia Father   . Heart disease Father        before age 59  . Heart attack Father   . Diabetes Brother   . Hyperlipidemia Brother   . Heart attack Brother     Allergies  Allergen Reactions  . Indomethacin Other (See Comments)     Renal Insufficiency  . Pregabalin Other (See Comments)    DIZZINESS   . Sulfa Antibiotics Hives  . Morphine And Related Nausea And Vomiting    Current Outpatient Medications on File Prior to Visit  Medication Sig Dispense Refill  . acetaminophen (TYLENOL) 500 MG tablet Take 1,000 mg by mouth daily as needed for moderate pain.    Marland Kitchen albuterol (PROVENTIL HFA;VENTOLIN HFA) 108 (90 Base) MCG/ACT inhaler Inhale 2 puffs into the lungs every 6 (six) hours as needed for wheezing or shortness of breath. 1 Inhaler 2  . alendronate  (FOSAMAX) 10 MG tablet Take 1 tablet (10 mg total) by mouth daily before breakfast. Take with a full glass of water on an empty stomach. 30 tablet 11  . allopurinol (ZYLOPRIM) 100 MG tablet Take 1 tablet (100 mg total) by mouth 2 (two) times daily. 60 tablet 3  . AMBULATORY NON FORMULARY MEDICATION Motorized scooter.  Diagnosis: Osteoarthritis M19.90 1 each 0  . azithromycin (ZITHROMAX) 250 MG tablet Take 2 tablets by mouth on day 1, followed by 1 tablet by mouth daily for 4 days. 6 tablet 0  . Blood Glucose Monitoring Suppl (GLUCOCOM BLOOD GLUCOSE MONITOR) DEVI Use to check  blood sugars 6 times daily E11.65    . DULoxetine (CYMBALTA) 30 MG capsule Take 1 capsule (30 mg total) by mouth at bedtime. 90 capsule 1  . ELIQUIS 5 MG TABS tablet TAKE ONE (1) TABLET BY MOUTH TWO (2) TIMES DAILY 180 tablet 1  . fluticasone (FLONASE) 50 MCG/ACT nasal spray Place 2 sprays into both nostrils at bedtime.    . fluticasone (FLOVENT HFA) 110 MCG/ACT inhaler Inhale 2 puffs into the lungs 2 (two) times daily as needed (bronchitis).    Marland Kitchen HYDROcodone-homatropine (HYCODAN) 5-1.5 MG/5ML syrup Take 5 mLs by mouth every 8 (eight) hours as needed. 100 mL 0  . Insulin Disposable Pump (V-GO 40) KIT Inject into the skin See admin instructions. Use with Novolog - 6 pumps before each meal    . levocetirizine (XYZAL) 5 MG tablet Take 1 tablet (5 mg total) by mouth every evening. 30 tablet 0  . Levothyroxine Sodium 150 MCG CAPS Take 1 capsule (150 mcg total) by mouth daily before breakfast. 30 capsule 3  . metoprolol succinate (TOPROL-XL) 25 MG 24 hr tablet Take 1 tablet (25 mg total) by mouth daily. 90 tablet 1  . NOVOLOG 100 UNIT/ML injection 100 units per day in VGO unknown bolus    . ondansetron (ZOFRAN ODT) 8 MG disintegrating tablet Take 1 tablet (8 mg total) by mouth every 8 (eight) hours as needed for nausea or vomiting. 20 tablet 0  . OVER THE COUNTER MEDICATION Take 0.5 tablets by mouth at bedtime. THC Candy from  [place]    . oxyCODONE-acetaminophen (PERCOCET/ROXICET) 5-325 MG tablet Take 1 tablet by mouth every 6 (six) hours as needed for severe pain. 16 tablet 0  . Prenatal Vit-Fe Fumarate-FA (MULTIVITAMIN-PRENATAL) 27-0.8 MG TABS tablet Take 1 tablet by mouth daily. Takes for nail and hair growth     . ramipril (ALTACE) 2.5 MG capsule Take 1 capsule (2.5 mg total) by mouth at bedtime. 90 capsule 1  . Semaglutide 0.25 or 0.5 MG/DOSE SOPN Inject 0.70m weekly    . torsemide (DEMADEX) 10 MG tablet Take 1 tablet (10 mg total) by mouth daily. 90 tablet 1  . Vitamin D, Ergocalciferol, (DRISDOL) 50000 units CAPS capsule Take 1 capsule (50,000 Units total) by mouth every 7 (seven) days. 8 capsule 0   Current Facility-Administered Medications on File Prior to Visit  Medication Dose Route Frequency Provider Last Rate Last Dose  . acetaminophen (TYLENOL) solution 650 mg  650 mg Oral Q6H PRN DAngelia Mould MD        BP (!) 151/94   Pulse 64   Temp 97.8 F (36.6 C) (Oral)   Resp 16   Ht _0  (1.575 m)   Wt 216 lb 12.8 oz (98.3 kg)   SpO2 100%   BMI 39.65 kg/m       Objective:   Physical Exam   General Mental Status- Alert. General Appearance- Not in acute distress.   Skin General: Color- Normal Color. Moisture- Normal Moisture.   Neck Carotid Arteries- Normal color. Moisture- Normal Moisture. No carotid bruits. No JVD.  Chest and Lung Exam Auscultation: Breath Sounds:-Normal.  Cardiovascular Auscultation:Rythm- Regular. Murmurs & Other Heart Sounds:Auscultation of the heart reveals- No Murmurs.  Abdomen Inspection:-Inspeection Normal. Palpation/Percussion:Note:No mass. Palpation and Percussion of the abdomen reveal- Non Tender, Non Distended + BS, no rebound or guarding.    Neurologic Cranial Nerve exam:- CN III-XII intact(No nystagmus), symmetric smile. Strength:- 5/5 equal and symmetric strength both upper and lower extremities.  Assessment & Plan:  Your  chronic conditions appear to be controlled. No cardiac or neurologic signs or symptoms. Your heart on ausculation sounds in NSR today. Since chronic conditions stable and your upcoming cataract procedure is under versed sedation but not general anesthesia, I do think safe to proceed with procedure. Continue chronic meds.   Filled out your pre-op form today.  Keep appointment with cardiologist next month.  Follow up with me in 3 months or as needed  Asked jasmine to fill fax over preop form that I signed. Mackie Pai, PA-C

## 2017-10-26 ENCOUNTER — Telehealth: Payer: Self-pay

## 2017-10-26 DIAGNOSIS — M109 Gout, unspecified: Secondary | ICD-10-CM

## 2017-10-26 MED ORDER — ALLOPURINOL 100 MG PO TABS: 100.0000 mg | ORAL_TABLET | Freq: Two times a day (BID) | ORAL | 3 refills | Status: DC

## 2017-10-26 NOTE — Telephone Encounter (Signed)
Received refill request for allupurinol via fax. Refilled per protocol.

## 2017-11-16 ENCOUNTER — Ambulatory Visit (INDEPENDENT_AMBULATORY_CARE_PROVIDER_SITE_OTHER): Payer: Medicare HMO | Admitting: Cardiology

## 2017-11-16 ENCOUNTER — Encounter: Payer: Self-pay | Admitting: Cardiology

## 2017-11-16 VITALS — BP 118/62 | HR 74 | Ht 62.0 in | Wt 221.1 lb

## 2017-11-16 DIAGNOSIS — E11621 Type 2 diabetes mellitus with foot ulcer: Secondary | ICD-10-CM | POA: Insufficient documentation

## 2017-11-16 DIAGNOSIS — Z89421 Acquired absence of other right toe(s): Secondary | ICD-10-CM | POA: Insufficient documentation

## 2017-11-16 DIAGNOSIS — Z950 Presence of cardiac pacemaker: Secondary | ICD-10-CM

## 2017-11-16 DIAGNOSIS — E785 Hyperlipidemia, unspecified: Secondary | ICD-10-CM | POA: Diagnosis not present

## 2017-11-16 DIAGNOSIS — I10 Essential (primary) hypertension: Secondary | ICD-10-CM | POA: Diagnosis not present

## 2017-11-16 DIAGNOSIS — I482 Chronic atrial fibrillation, unspecified: Secondary | ICD-10-CM

## 2017-11-16 DIAGNOSIS — Z79899 Other long term (current) drug therapy: Secondary | ICD-10-CM | POA: Diagnosis not present

## 2017-11-16 DIAGNOSIS — L97519 Non-pressure chronic ulcer of other part of right foot with unspecified severity: Secondary | ICD-10-CM

## 2017-11-16 MED ORDER — ATORVASTATIN CALCIUM 40 MG PO TABS: 40.0000 mg | ORAL_TABLET | Freq: Every day | ORAL | 3 refills | Status: DC

## 2017-11-16 NOTE — Patient Instructions (Addendum)
Medication Instructions: Your Physician recommend you make the following changes to your medication. Start: Lipitor 40 mg every evening   If you need a refill on your cardiac medications before your next appointment, please call your pharmacy.   Labwork: Your physician recommends that you return for lab work today (Lipid)   Procedures/Testing: None  Follow-Up: Your physician wants you to follow-up in 3 months with Dr. Harrell Gave.    Special Instructions: A referral has been sent today to follow up with an EP specialist. You will receive a phone call for appointment,    Thank you for choosing [facility] at [facility]!!

## 2017-11-16 NOTE — Progress Notes (Signed)
Cardiology Office Note:    Date:  11/16/2017   ID:  Alexis [name], DOB [birth year], MRN [id number]  PCP:  No primary care provider on file.  Cardiologist:  Buford Dresser, MD PhD  Referring MD: Elise Benne   CC: establish care  History of Present Illness:    Alexis [name] is a 67 y.o. female with a hx of pacemaker, atrial fibrillation who is seen as a new consult at the request of Saguier, Iris Pert for the evaluation and management of atrial fibrillation.   Patient concerns: used to have a cardiologist (Dr. Minna Merritts) but had to change due to insurance issues. She was last seen by Dr. Curt Bears in 05/2016 and Dr. Irish Lack in 10/2015.  Had issues last year with an arterial blood clot, ended up needing debridement and a toe amputation. Has done well since, follows with vascular surgery.  She was diagnosed with atrial fibrillation around 20 years ago, occurred when she was diagnosed with Grave's disease. Had an ablation in 2005 at [facility] she has a lower afib burden but can still feel when she goes in/out of afib. Never misses eliquis. Has a Medtronic CRT-P for tachy-brady syndrome.  Reports prior history of heart caths, told that her vessels looked clear. I unfortunately cannot see these results. Reports episode of heart failure in 2005 around Christmas time. Watches salt, has not had repeat issues. No LE edema, no PND, no orthopnea. Has had a sleep study, told she doesn't need a CPAP. Trying actively to lose weight. Never had any chest pain. No syncope. Has a CRT-P, There are reports of systolic dysfunction in Care Everywhere, but I cannot see these results.  Doesn't check BP at home. She told me that she has never been on a statin. On review however, it appears she was on a statin until 04/2016, when it was held during a hospitalization for elevation of LFTs with a viral illness. She had been on atorvastatin 10 mg. Her last lipids were in 2017, and her LDL was  110 on this dose.  Chronic neuropathy in the feet from diabetes, uses insulin pump. Working out at [employer] 3-4 times/week, tries to stay active. Does have claudication with walking, better with rest. No cigarettes but does vape every night. Stopped drinking at age 74, recovering alcoholic. Also used Xanax for 30 years and quit.   No chest pain, SOB, PND, orthopnea, LE edema, or syncope.  Past Medical History:  Diagnosis Date  . Allergy   . Anxiety   . Arthritis   . Asthma   . Atrial fibrillation ([place])   . CHF (congestive heart failure) ([place])   . COPD (chronic obstructive pulmonary disease) ([place])   . Diabetes mellitus without complication ([place])   . Hyperlipidemia   . Hypertension   . Myocardial infarction ([place])    several  . Pacemaker    CRT-P with RV lead and His Bundle lead ( high threshold)   . Peripheral neuropathy   . Pneumonia   . PONV (postoperative nausea and vomiting)    unsure of exactly what happened at [facility] in 2010 (knee surgery) that led to crital care  . Thyroid disease     Past Surgical History:  Procedure Laterality Date  . ABDOMINAL AORTOGRAM W/LOWER EXTREMITY N/A 04/29/2016   Procedure: Abdominal Aortogram w/Lower Extremity;  Surgeon: Waynetta Sandy, MD;  Location: [facility];  Service: Cardiovascular;  Laterality: N/A;  . ABDOMINAL AORTOGRAM W/LOWER EXTREMITY N/A  05/06/2017   Procedure: ABDOMINAL AORTOGRAM W/LOWER EXTREMITY;  Surgeon: Angelia Mould, MD;  Location: [place] [facility];  Service: Cardiovascular;  Laterality: N/A;  bilateral  . ABDOMINAL HYSTERECTOMY    . AMPUTATION Right 07/30/2016   Procedure: AMPUTATION RIGHT SECOND TOE;  Surgeon: Angelia Mould, MD;  Location: [facility];  Service: Vascular;  Laterality: Right;  . CARDIAC CATHETERIZATION     2005 at [facility]  . PERIPHERAL VASCULAR ATHERECTOMY Right 04/29/2016   Procedure: Peripheral Vascular Atherectomy-Right Popliteal;  Surgeon: Waynetta Sandy, MD;  Location: [facility];  Service: Cardiovascular;  Laterality: Right;  . PERIPHERAL VASCULAR INTERVENTION Right 04/29/2016   Procedure: Peripheral Vascular Intervention-Right Popliteal;  Surgeon: Waynetta Sandy, MD;  Location: [facility];  Service: Cardiovascular;  Laterality: Right;  POPLITEAL PTA  . RADIOFREQUENCY ABLATION    . TOTAL KNEE ARTHROPLASTY    . TUBAL LIGATION      Current Medications: Current Outpatient Medications on File Prior to Visit  Medication Sig  . albuterol (PROVENTIL HFA;VENTOLIN HFA) 108 (90 Base) MCG/ACT inhaler Inhale 2 puffs into the lungs every 6 (six) hours as needed for wheezing or shortness of breath.  Marland Kitchen alendronate (FOSAMAX) 10 MG tablet Take 1 tablet (10 mg total) by mouth daily before breakfast. Take with a full glass of water on an empty stomach.  Marland Kitchen allopurinol (ZYLOPRIM) 100 MG tablet Take 1 tablet (100 mg total) by mouth 2 (two) times daily.  . AMBULATORY NON FORMULARY MEDICATION Motorized scooter.  Diagnosis: Osteoarthritis M19.90  . Blood Glucose Monitoring Suppl (GLUCOCOM BLOOD GLUCOSE MONITOR) DEVI Use to check blood sugars 6 times daily E11.65  . DULoxetine (CYMBALTA) 30 MG capsule Take 1 capsule (30 mg total) by mouth at bedtime.  Marland Kitchen ELIQUIS 5 MG TABS tablet TAKE ONE (1) TABLET BY MOUTH TWO (2) TIMES DAILY  . fluticasone (FLONASE) 50 MCG/ACT nasal spray Place 2 sprays into both nostrils at bedtime. (Patient taking differently: Place 2 sprays into both nostrils as needed. )  . fluticasone (FLOVENT HFA) 110 MCG/ACT inhaler Inhale 2 puffs into the lungs 2 (two) times daily as needed (bronchitis).  . Insulin Disposable Pump (V-GO 40) KIT Inject into the skin See admin instructions. Use with Novolog - 6 pumps before each meal  . levocetirizine (XYZAL) 5 MG tablet Take 1 tablet (5 mg total) by mouth every evening.  . Levothyroxine Sodium 150 MCG CAPS Take 1 capsule (150 mcg total) by mouth daily before breakfast.    . metoprolol succinate (TOPROL-XL) 25 MG 24 hr tablet Take 1 tablet (25 mg total) by mouth daily.  Marland Kitchen NOVOLOG 100 UNIT/ML injection 100 units per day in VGO unknown bolus  . ondansetron (ZOFRAN ODT) 8 MG disintegrating tablet Take 1 tablet (8 mg total) by mouth every 8 (eight) hours as needed for nausea or vomiting.  . Prenatal Vit-Fe Fumarate-FA (MULTIVITAMIN-PRENATAL) 27-0.8 MG TABS tablet Take 1 tablet by mouth daily. Takes for nail and hair growth   . ramipril (ALTACE) 2.5 MG capsule Take 1 capsule (2.5 mg total) by mouth at bedtime.  . Semaglutide 0.25 or 0.5 MG/DOSE SOPN Inject 0.29m weekly  . torsemide (DEMADEX) 10 MG tablet Take 1 tablet (10 mg total) by mouth daily.  .Marland KitchenHYDROcodone-homatropine (HYCODAN) 5-1.5 MG/5ML syrup Take 5 mLs by mouth every 8 (eight) hours as needed. (Patient not taking: Reported on 11/16/2017)   Current Facility-Administered Medications on File Prior to Visit  Medication  . acetaminophen (TYLENOL) solution 650  mg     Allergies:   Indomethacin; Pregabalin; Sulfa antibiotics; and Morphine and related   Social History   Socioeconomic History  . Marital status: Married    Spouse name: Not on file  . Number of children: Not on file  . Years of education: Not on file  . Highest education level: Not on file  Occupational History  . Occupation: retired  Scientific laboratory technician  . Financial resource strain: Not on file  . Food insecurity:    Worry: Not on file    Inability: Not on file  . Transportation needs:    Medical: Not on file    Non-medical: Not on file  Tobacco Use  . Smoking status: Former Smoker    Last attempt to quit: 03/01/1998    Years since quitting: 19.7  . Smokeless tobacco: Never Used  Substance and Sexual Activity  . 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  . Drug use: Yes    Types: Marijuana    Comment: remote use  . Sexual activity: Never  Lifestyle  .  Physical activity:    Days per week: Not on file    Minutes per session: Not on file  . Stress: Not on file  Relationships  . Social connections:    Talks on phone: Not on file    Gets together: Not on file    Attends religious service: Not on file    Active member of club or organization: Not on file    Attends meetings of clubs or organizations: Not on file    Relationship status: Not on file  Other Topics Concern  . Not on file  Social History Narrative  . Not on file     Family History: The patient's family history includes Diabetes in her brother, father, and mother; Heart attack in her brother and father; Heart disease in her father; Hyperlipidemia in her brother, father, and mother. Father had an MI at age 61, died of a ruptured aneurysm.  ROS:   Please see the history of present illness.  Additional pertinent ROS: Review of Systems  Constitutional: Positive for weight loss. Negative for chills and fever.  HENT: Positive for ear pain and hearing loss.   Eyes: Negative for blurred vision and pain.  Respiratory: Positive for cough and wheezing. Negative for sputum production and shortness of breath.   Cardiovascular: Positive for claudication. Negative for chest pain, palpitations, orthopnea, leg swelling and PND.  Gastrointestinal: Positive for constipation. Negative for abdominal pain, blood in stool and melena.  Genitourinary: Negative for dysuria and hematuria.  Musculoskeletal: Negative for falls and myalgias.  Skin: Negative for rash.  Neurological: Positive for dizziness. Negative for focal weakness and loss of consciousness.  Endo/Heme/Allergies: Bruises/bleeds easily.   EKGs/Labs/Other Studies Reviewed:    The following studies were reviewed today: Echo 11/26/15 Study Conclusions  - Left ventricle: The cavity size was normal. Wall thickness was   normal. Systolic function was normal. The estimated ejection   fraction was in the range of 50% to 55%. - Left  atrium: The atrium was mildly dilated. - Right ventricle: The cavity size was mildly dilated. - Atrial septum: No defect or patent foramen ovale was identified. - Impressions: Overall poor image quality  Impressions: - Overall poor image quality  EKG:  EKG is ordered today.  The ekg ordered today demonstrates atrial fibrillation, appears biV paced (though no pacer  spikes visible) at 75 bpm.  Recent Labs: 03/15/2017: TSH 2.09 04/08/2017: Platelets 69 04/14/2017: ALT 45; Magnesium 1.4 05/06/2017: BUN 25; Creatinine, Ser 0.70; Hemoglobin 13.9; Potassium 3.9; Sodium 139  Recent Lipid Panel    Component Value Date/Time   CHOL 184 04/10/2015 1040   TRIG 254.0 (H) 04/10/2015 1040   HDL 33.10 (L) 04/10/2015 1040   CHOLHDL 6 04/10/2015 1040   VLDL 50.8 (H) 04/10/2015 1040   LDLDIRECT 111.0 04/10/2015 1040    Physical Exam:    VS:  BP 118/62 (BP Location: Left Wrist, Patient Position: Sitting, Cuff Size: Normal)   Pulse 74   Ht 5' 2"  (1.575 m)   Wt 221 lb 1.9 oz (100.3 kg)   BMI 40.44 kg/m     Wt Readings from Last 3 Encounters:  11/16/17 221 lb 1.9 oz (100.3 kg)  10/17/17 216 lb 12.8 oz (98.3 kg)  06/15/17 222 lb (100.7 kg)     GEN: Well nourished, well developed in no acute distress HEENT: Normal NECK: No JVD; No carotid bruits LYMPHATICS: No lymphadenopathy CARDIAC: regular rhythm, normal S1 and S2, no murmurs, rubs, gallops. Radial pulses 2+ bilaterally. DP pulses not palpable. PT pulses not palpable. RESPIRATORY:  Clear to auscultation without rales, wheezing or rhonchi  ABDOMEN: Soft, non-tender, non-distended MUSCULOSKELETAL:  No edema; No deformity. Feet are warm with fair capillary refill. S/p toe amputation. SKIN: Warm and dry NEUROLOGIC:  Alert and oriented x 3 PSYCHIATRIC:  Normal affect   ASSESSMENT:    1. Chronic atrial fibrillation ([place])   2. Essential hypertension   3. Hyperlipidemia, unspecified hyperlipidemia type   4. Pacemaker   5. Medication  management    PLAN:    1. Hypertension: on metoprolol succinate 25 mg, ramipril 2.5 mg daily. Also takes torsemide 10 mg by mouth daily. Good control today.  2. Hyperlipidemia: with diabetes, at increased risk. States she has had clean caths in the past. Not currently on a statin. The 10-year ASCVD risk score Mikey Bussing DC Brooke Bonito., et al., 2013) is: 18%   Values used to calculate the score:     Age: 9 years     Sex: Female     Is Non-Hispanic African American: No     Diabetic: Yes     Tobacco smoker: No     Systolic Blood Pressure: 482 mmHg     Is BP treated: Yes     HDL Cholesterol: 33.1 mg/dL     Total Cholesterol: 184 mg/dL  -check LFTs given prior history of elevated LFTs on statin, though thought to be due to viral illness. -check lipids today -start atorvastatin 40 mg dose given her risk  3. Atrial fibrillation: has not missed any apixaban. On metoprolol succinate 25 mg for rate control. Prior ablation in 2005. CHA2DS2/VAS Stroke Risk Points      6  >= 2 Points: High Risk  1 - 1.99 Points: Medium Risk  0 Points: Low Risk   1 Has Congestive Heart Failure:  Yes   1 Has Vascular Disease:  Yes   1 Has Hypertension:  Yes   1 Age:  30   1 Has Diabetes:  Yes   0 Had Stroke:  No  Had TIA:  No  Had thromboembolism:  No  1 Female:  Yes     4. Pacemaker placement: placed for tachy-brady syndrome. Saw Dr. Curt Bears in the past. Will refer her back to device clinic.    Plan for follow up: 3 mos, recheck lipids on statin.  Medication Adjustments/Labs and Tests Ordered: Current medicines are reviewed at length with the patient today.  Concerns regarding medicines are outlined above.  Orders Placed This Encounter  Procedures  . Lipid panel  . Hepatic function panel  . Ambulatory referral to Cardiac Electrophysiology  . EKG 12-Lead   Meds ordered this encounter  Medications  . atorvastatin (LIPITOR) 40 MG tablet    Sig: Take 1 tablet (40 mg total) by mouth daily at 6 PM.    Dispense:   90 tablet    Refill:  3    Patient Instructions  Medication Instructions: Your Physician recommend you make the following changes to your medication. Start: Lipitor 40 mg every evening   If you need a refill on your cardiac medications before your next appointment, please call your pharmacy.   Labwork: Your physician recommends that you return for lab work today (Lipid)   Procedures/Testing: None  Follow-Up: Your physician wants you to follow-up in 3 months with Dr. Harrell Gave.    Special Instructions: A referral has been sent today to follow up with an EP specialist. You will receive a phone call for appointment,    Thank you for choosing [facility] at [facility]!!        Signed, Buford Dresser, MD PhD 11/16/2017 11:12 AM    [place]

## 2017-11-17 LAB — LIPID PANEL
CHOL/HDL RATIO: 5.3 ratio — AB (ref 0.0–4.4)
Cholesterol, Total: 165 mg/dL (ref 100–199)
HDL: 31 mg/dL — AB (ref >39)
LDL Calculated: 99 mg/dL (ref 0–99)
TRIGLYCERIDES: 177 mg/dL — AB (ref 0–149)
VLDL CHOLESTEROL CAL: 35 mg/dL (ref 5–40)

## 2017-11-17 LAB — HEPATIC FUNCTION PANEL
ALT: 30 IU/L (ref 0–32)
AST: 47 IU/L — ABNORMAL HIGH (ref 0–40)
Albumin: 4.1 g/dL (ref 3.6–4.8)
Alkaline Phosphatase: 169 IU/L — ABNORMAL HIGH (ref 39–117)
BILIRUBIN TOTAL: 0.5 mg/dL (ref 0.0–1.2)
Bilirubin, Direct: 0.2 mg/dL (ref 0.00–0.40)
Total Protein: 7.1 g/dL (ref 6.0–8.5)

## 2017-11-21 ENCOUNTER — Encounter: Payer: Self-pay | Admitting: Medical

## 2017-11-21 MED ORDER — METOPROLOL SUCCINATE ER 25 MG PO TB24: 25.0000 mg | ORAL_TABLET | Freq: Every day | ORAL | 1 refills | Status: DC

## 2017-11-21 MED ORDER — TORSEMIDE 10 MG PO TABS: 10.0000 mg | ORAL_TABLET | Freq: Every day | ORAL | 1 refills | Status: DC

## 2017-12-06 ENCOUNTER — Encounter: Payer: Self-pay | Admitting: Medical

## 2017-12-12 ENCOUNTER — Ambulatory Visit (HOSPITAL_BASED_OUTPATIENT_CLINIC_OR_DEPARTMENT_OTHER)
Admission: RE | Admit: 2017-12-12 | Discharge: 2017-12-12 | Disposition: A | Discharge status: Home / Self Care | Payer: Medicare HMO | Source: Ambulatory Visit | Attending: Medical | Admitting: Medical

## 2017-12-12 ENCOUNTER — Encounter: Payer: Self-pay | Admitting: Medical

## 2017-12-12 ENCOUNTER — Ambulatory Visit (INDEPENDENT_AMBULATORY_CARE_PROVIDER_SITE_OTHER): Payer: Medicare HMO | Admitting: Medical

## 2017-12-12 VITALS — BP 120/60 | HR 87 | Temp 98.0°F | Resp 16 | Ht 62.0 in | Wt 223.0 lb

## 2017-12-12 DIAGNOSIS — K5900 Constipation, unspecified: Secondary | ICD-10-CM | POA: Diagnosis not present

## 2017-12-12 DIAGNOSIS — Z23 Encounter for immunization: Secondary | ICD-10-CM | POA: Diagnosis not present

## 2017-12-12 DIAGNOSIS — E1059 Type 1 diabetes mellitus with other circulatory complications: Secondary | ICD-10-CM

## 2017-12-12 NOTE — Progress Notes (Signed)
Subjective:    Patient ID: Alexis [name], female    DOB: 29-Apr-1950, 67 y.o.   MRN: [id number]  HPI   Pt in for some recent constipation.   In the past when was on narcotics she had severe constipation. She thinks ibuprofen constipates her as well. Pt states she has small hard stools. She states not having any significant BM. No hx of abdomen surgeries. She states no hx of bowel obstruction. She states has been moderate constipated on and off for 3 months.   Pt has had some daily stools but every day has to use stool softeners.  She does note that some recent loose stools about 9 days ago after feeling constipated. No nausea or vomiting reported.    Review of Systems  Constitutional: Negative for chills, fatigue and fever.  Respiratory: Negative for chest tightness, shortness of breath and wheezing.   Cardiovascular: Negative for chest pain and palpitations.  Gastrointestinal: Positive for constipation. Negative for abdominal distention, blood in stool, diarrhea, nausea, rectal pain and vomiting.  Genitourinary: Negative for dysuria and flank pain.  Musculoskeletal: Negative for back pain.  Skin: Negative for rash.  Neurological: Negative for dizziness, seizures, weakness and headaches.  Hematological: Negative for adenopathy. Does not bruise/bleed easily.  Psychiatric/Behavioral: Negative for behavioral problems and confusion.    Past Medical History:  Diagnosis Date  . Allergy   . Anxiety   . Arthritis   . Asthma   . Atrial fibrillation ([place])   . CHF (congestive heart failure) ([place])   . COPD (chronic obstructive pulmonary disease) ([place])   . Diabetes mellitus without complication ([place])   . Hyperlipidemia   . Hypertension   . Myocardial infarction ([place])    several  . Pacemaker    CRT-P with RV lead and His Bundle lead ( high threshold)   . Peripheral neuropathy   . Pneumonia   . PONV (postoperative nausea and vomiting)    unsure of exactly what happened at [facility] in 2010 (knee surgery) that led to crital care  . Thyroid disease      Social History   Socioeconomic History  . Marital status: Married    Spouse name: Not on file  . Number of children: Not on file  . Years of education: Not on file  . Highest education level: Not on file  Occupational History  . Occupation: retired  Scientific laboratory technician  . Financial resource strain: Not on file  . Food insecurity:    Worry: Not on file    Inability: Not on file  . Transportation needs:    Medical: Not on file    Non-medical: Not on file  Tobacco Use  . Smoking status: Former Smoker    Last attempt to quit: 03/01/1998    Years since quitting: 19.7  . Smokeless tobacco: Never Used  Substance and Sexual Activity  . 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  . Drug use: Yes    Types: Marijuana    Comment: remote use  . Sexual activity: Never  Lifestyle  . Physical activity:    Days per week: Not on file    Minutes per session: Not on file  . Stress: Not on file  Relationships  . Social connections:    Talks on phone: Not on file    Gets together: Not on file    Attends religious service: Not  on file    Active member of club or organization: Not on file    Attends meetings of clubs or organizations: Not on file    Relationship status: Not on file  . Intimate partner violence:    Fear of current or ex partner: Not on file    Emotionally abused: Not on file    Physically abused: Not on file    Forced sexual activity: Not on file  Other Topics Concern  . Not on file  Social History Narrative  . Not on file    Past Surgical History:  Procedure Laterality Date  . ABDOMINAL AORTOGRAM W/LOWER EXTREMITY N/A 04/29/2016   Procedure: Abdominal Aortogram w/Lower Extremity;  Surgeon: Waynetta Sandy, MD;  Location: [place];  Service: Cardiovascular;  Laterality: N/A;  . ABDOMINAL  AORTOGRAM W/LOWER EXTREMITY N/A 05/06/2017   Procedure: ABDOMINAL AORTOGRAM W/LOWER EXTREMITY;  Surgeon: Angelia Mould, MD;  Location: [facility] [facility];  Service: Cardiovascular;  Laterality: N/A;  bilateral  . ABDOMINAL HYSTERECTOMY    . AMPUTATION Right 07/30/2016   Procedure: AMPUTATION RIGHT SECOND TOE;  Surgeon: Angelia Mould, MD;  Location: [facility];  Service: Vascular;  Laterality: Right;  . CARDIAC CATHETERIZATION     2005 at [facility]  . PERIPHERAL VASCULAR ATHERECTOMY Right 04/29/2016   Procedure: Peripheral Vascular Atherectomy-Right Popliteal;  Surgeon: Waynetta Sandy, MD;  Location: [facility] [facility];  Service: Cardiovascular;  Laterality: Right;  . PERIPHERAL VASCULAR INTERVENTION Right 04/29/2016   Procedure: Peripheral Vascular Intervention-Right Popliteal;  Surgeon: Waynetta Sandy, MD;  Location: [place] [facility];  Service: Cardiovascular;  Laterality: Right;  POPLITEAL PTA  . RADIOFREQUENCY ABLATION    . TOTAL KNEE ARTHROPLASTY    . TUBAL LIGATION      Family History  Problem Relation Age of Onset  . Diabetes Mother   . Hyperlipidemia Mother   . Diabetes Father   . Hyperlipidemia Father   . Heart disease Father        before age 42  . Heart attack Father   . Diabetes Brother   . Hyperlipidemia Brother   . Heart attack Brother     Allergies  Allergen Reactions  . Indomethacin Other (See Comments)     Renal Insufficiency  . Pregabalin Other (See Comments)    DIZZINESS   . Sulfa Antibiotics Hives  . Morphine And Related Nausea And Vomiting    Current Outpatient Medications on File Prior to Visit  Medication Sig Dispense Refill  . albuterol (PROVENTIL HFA;VENTOLIN HFA) 108 (90 Base) MCG/ACT inhaler Inhale 2 puffs into the lungs every 6 (six) hours as needed for wheezing or shortness of breath. 1 Inhaler 2  . alendronate (FOSAMAX) 10 MG tablet Take 1 tablet (10 mg total) by mouth daily before breakfast. Take with a  full glass of water on an empty stomach. 30 tablet 11  . allopurinol (ZYLOPRIM) 100 MG tablet Take 1 tablet (100 mg total) by mouth 2 (two) times daily. 60 tablet 3  . AMBULATORY NON FORMULARY MEDICATION Motorized scooter.  Diagnosis: Osteoarthritis M19.90 1 each 0  . atorvastatin (LIPITOR) 40 MG tablet Take 1 tablet (40 mg total) by mouth daily at 6 PM. 90 tablet 3  . Blood Glucose Monitoring Suppl (GLUCOCOM BLOOD GLUCOSE MONITOR) DEVI Use to check blood sugars 6 times daily E11.65    . DULoxetine (CYMBALTA) 30 MG capsule Take 1 capsule (30 mg total) by mouth at bedtime. 90 capsule 1  .  ELIQUIS 5 MG TABS tablet TAKE ONE (1) TABLET BY MOUTH TWO (2) TIMES DAILY 180 tablet 1  . fluticasone (FLONASE) 50 MCG/ACT nasal spray Place 2 sprays into both nostrils at bedtime. (Patient taking differently: Place 2 sprays into both nostrils as needed. )    . fluticasone (FLOVENT HFA) 110 MCG/ACT inhaler Inhale 2 puffs into the lungs 2 (two) times daily as needed (bronchitis).    Marland Kitchen HYDROcodone-homatropine (HYCODAN) 5-1.5 MG/5ML syrup Take 5 mLs by mouth every 8 (eight) hours as needed. 100 mL 0  . Insulin Disposable Pump (V-GO 40) KIT Inject into the skin See admin instructions. Use with Novolog - 6 pumps before each meal    . levocetirizine (XYZAL) 5 MG tablet Take 1 tablet (5 mg total) by mouth every evening. 30 tablet 0  . Levothyroxine Sodium 150 MCG CAPS Take 1 capsule (150 mcg total) by mouth daily before breakfast. 30 capsule 3  . metoprolol succinate (TOPROL-XL) 25 MG 24 hr tablet Take 1 tablet (25 mg total) by mouth daily. 90 tablet 1  . NOVOLOG 100 UNIT/ML injection 100 units per day in VGO unknown bolus    . ondansetron (ZOFRAN ODT) 8 MG disintegrating tablet Take 1 tablet (8 mg total) by mouth every 8 (eight) hours as needed for nausea or vomiting. 20 tablet 0  . Prenatal Vit-Fe Fumarate-FA (MULTIVITAMIN-PRENATAL) 27-0.8 MG TABS tablet Take 1 tablet by mouth daily. Takes for nail and hair growth     .  ramipril (ALTACE) 2.5 MG capsule Take 1 capsule (2.5 mg total) by mouth at bedtime. 90 capsule 1  . Semaglutide 0.25 or 0.5 MG/DOSE SOPN Inject 0.69m weekly    . torsemide (DEMADEX) 10 MG tablet Take 1 tablet (10 mg total) by mouth daily. 90 tablet 1   Current Facility-Administered Medications on File Prior to Visit  Medication Dose Route Frequency Provider Last Rate Last Dose  . acetaminophen (TYLENOL) solution 650 mg  650 mg Oral Q6H PRN DAngelia Mould MD        BP (!) 133/48   Pulse 87   Temp 98 F (36.7 C) (Oral)   Resp 16   Ht 5' 2" (1.575 m)   Wt 223 lb (101.2 kg)   SpO2 99%   BMI 40.79 kg/m       Objective:   Physical Exam   General Appearance- Not in acute distress.  HEENT Eyes- Scleraeral/Conjuntiva-bilat- Not Yellow. Mouth & Throat- Normal.  Chest and Lung Exam Auscultation: Breath sounds:-Normal. Adventitious sounds:- No Adventitious sounds.  Cardiovascular Auscultation:Rythm - Regular. Heart Sounds -Normal heart sounds.  Abdomen Inspection:-Inspection Normal.  Palpation/Perucssion: Palpation and Percussion of the abdomen reveal- Non Tender, No Rebound tenderness, No rigidity(Guarding) and No Palpable abdominal masses.  Liver:-Normal.  Spleen:- Normal.   Back- no cva tenderness.     Assessment & Plan:  You do report history of chronic constipation.  Worse over the past 3 months.  However you are describing passing small hard stools daily basis.  We will get x-ray of abdomen today to assess stool burden and see if you have any dilated bowels.  If x-ray just shows heavy stool burden would likely recommend a bottle of magnesium citrate and then advise using either MiraLAX or Dulcolax every third day.  Would recommend that you stay well-hydrated and get some daily exercise.  We will update you on x-ray results.  Please get metabolic panel today.  Flu vaccine given today.  Follow-up in 7 to 10 days or as needed.  Mackie Pai, PA-C

## 2017-12-12 NOTE — Patient Instructions (Addendum)
You do report history of chronic constipation.  Worse over the past 3 months.  However you are describing passing small hard stools daily basis.  We will get x-ray of abdomen today to assess stool burden and see if you have any dilated bowels.  If x-ray just shows heavy stool burden would likely recommend a bottle of magnesium citrate and then advise using either MiraLAX or Dulcolax every third day.  Would recommend that you stay well-hydrated and get some daily exercise.  We will update you on x-ray results.  Please get metabolic panel today.  Flu vaccine given today.  Follow-up in 7 to 10 days or as needed.

## 2017-12-12 NOTE — Addendum Note (Signed)
Addended by: Hinton Dyer on: 12/12/2017 03:25 PM   Modules accepted: Orders

## 2017-12-13 LAB — COMPREHENSIVE METABOLIC PANEL
ALBUMIN: 3.8 g/dL (ref 3.5–5.2)
ALK PHOS: 154 U/L — AB (ref 39–117)
ALT: 42 U/L — AB (ref 0–35)
AST: 59 U/L — ABNORMAL HIGH (ref 0–37)
BILIRUBIN TOTAL: 0.7 mg/dL (ref 0.2–1.2)
BUN: 28 mg/dL — AB (ref 6–23)
CO2: 30 mEq/L (ref 19–32)
CREATININE: 0.92 mg/dL (ref 0.40–1.20)
Calcium: 9.4 mg/dL (ref 8.4–10.5)
Chloride: 101 mEq/L (ref 96–112)
GFR: 64.36 mL/min (ref >60.00)
GLUCOSE: 194 mg/dL — AB (ref 70–99)
Potassium: 3.9 mEq/L (ref 3.5–5.1)
SODIUM: 138 meq/L (ref 135–145)
TOTAL PROTEIN: 6.9 g/dL (ref 6.0–8.3)

## 2017-12-13 MED ORDER — DULOXETINE HCL 30 MG PO CPEP: 30.0000 mg | ORAL_CAPSULE | Freq: Every day | ORAL | 1 refills | Status: DC

## 2017-12-16 ENCOUNTER — Encounter: Payer: Self-pay | Admitting: Medical

## 2017-12-16 DIAGNOSIS — R69 Illness, unspecified: Secondary | ICD-10-CM | POA: Diagnosis not present

## 2017-12-17 IMAGING — CR DG CHEST 2V
2 series · 2 of 2 positions shown · non-contrast
Comparison: PA and lateral chest x-ray February 15, 2016

CLINICAL DATA: Clinical improvement since getting the heart rate
under control. History of CHF, COPD, atrial fibrillation.

EXAM:
CHEST  2 VIEW

[chest pa]
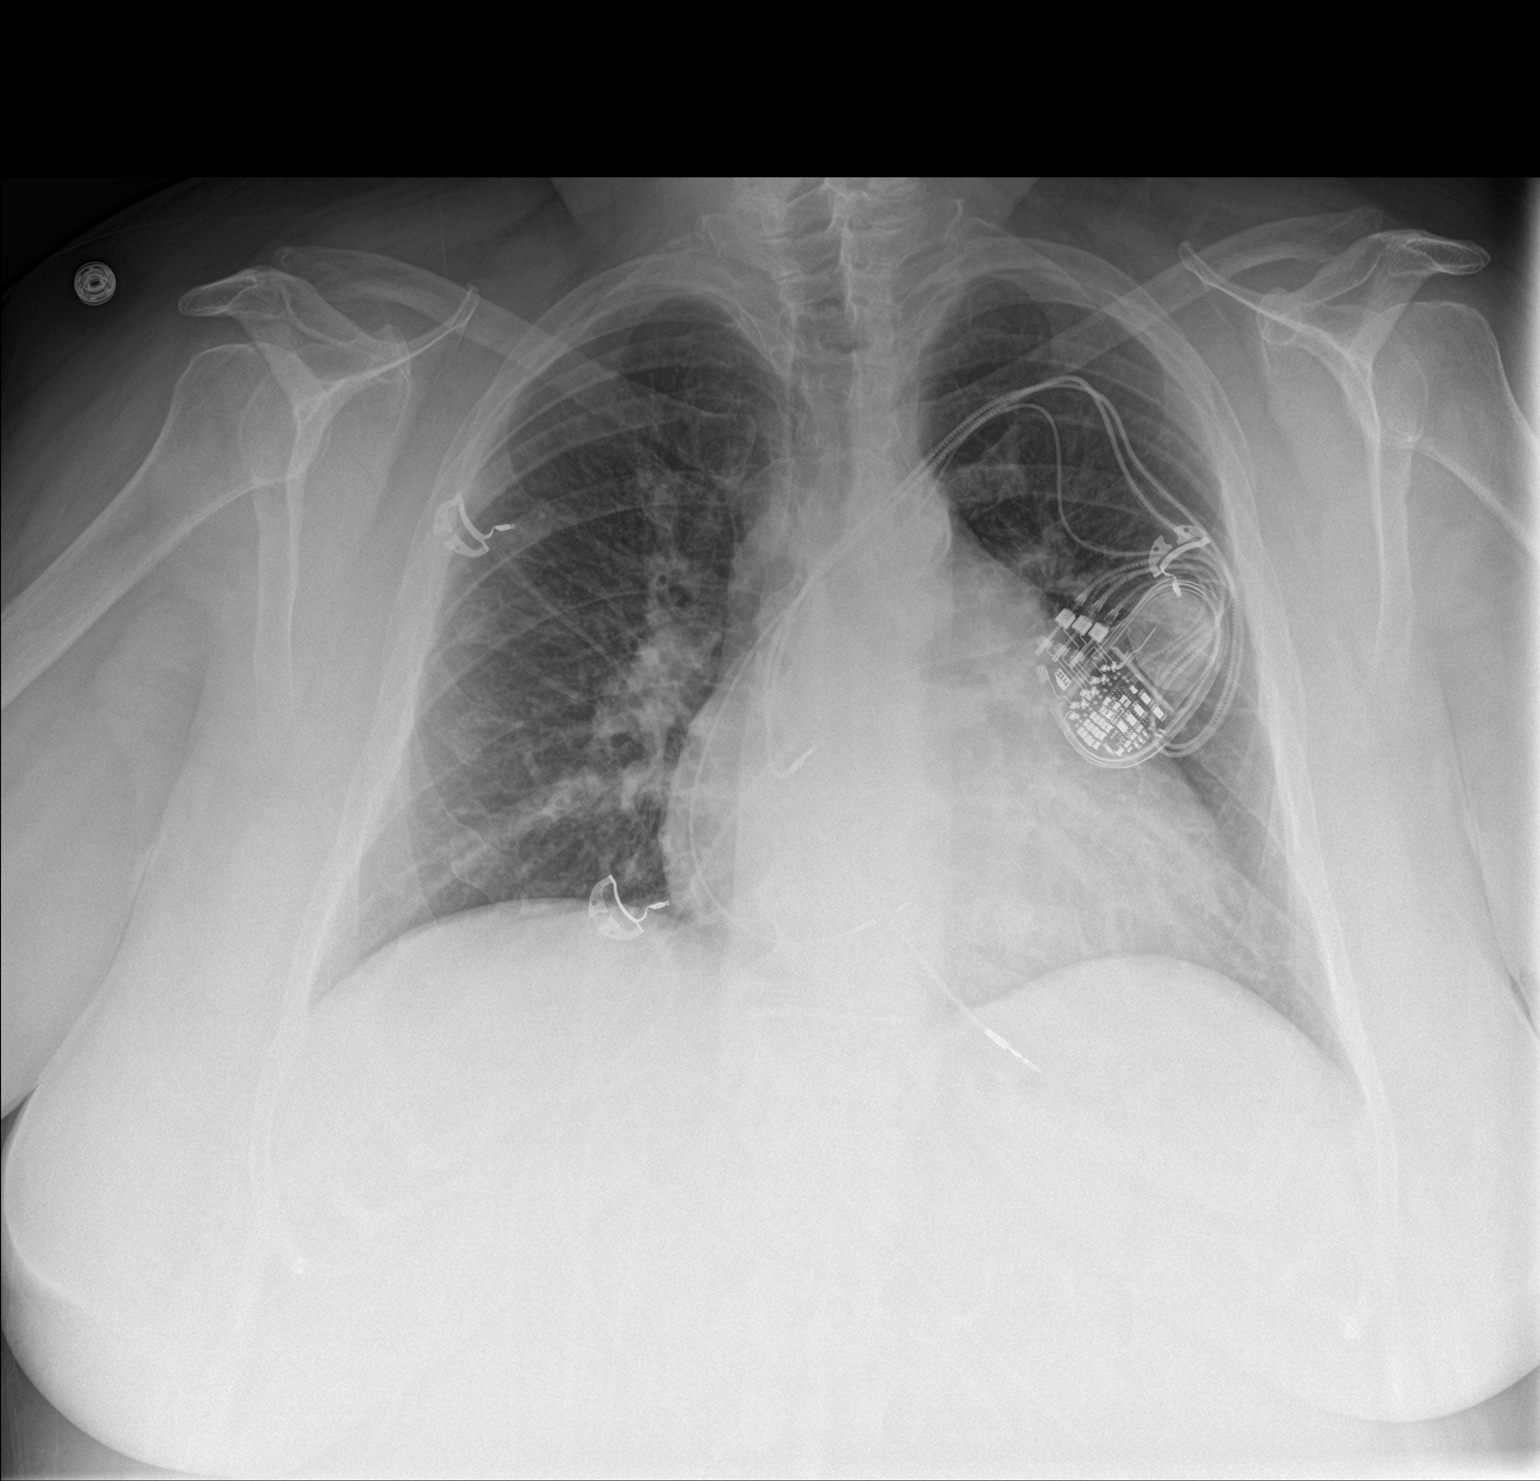

[chest lat]
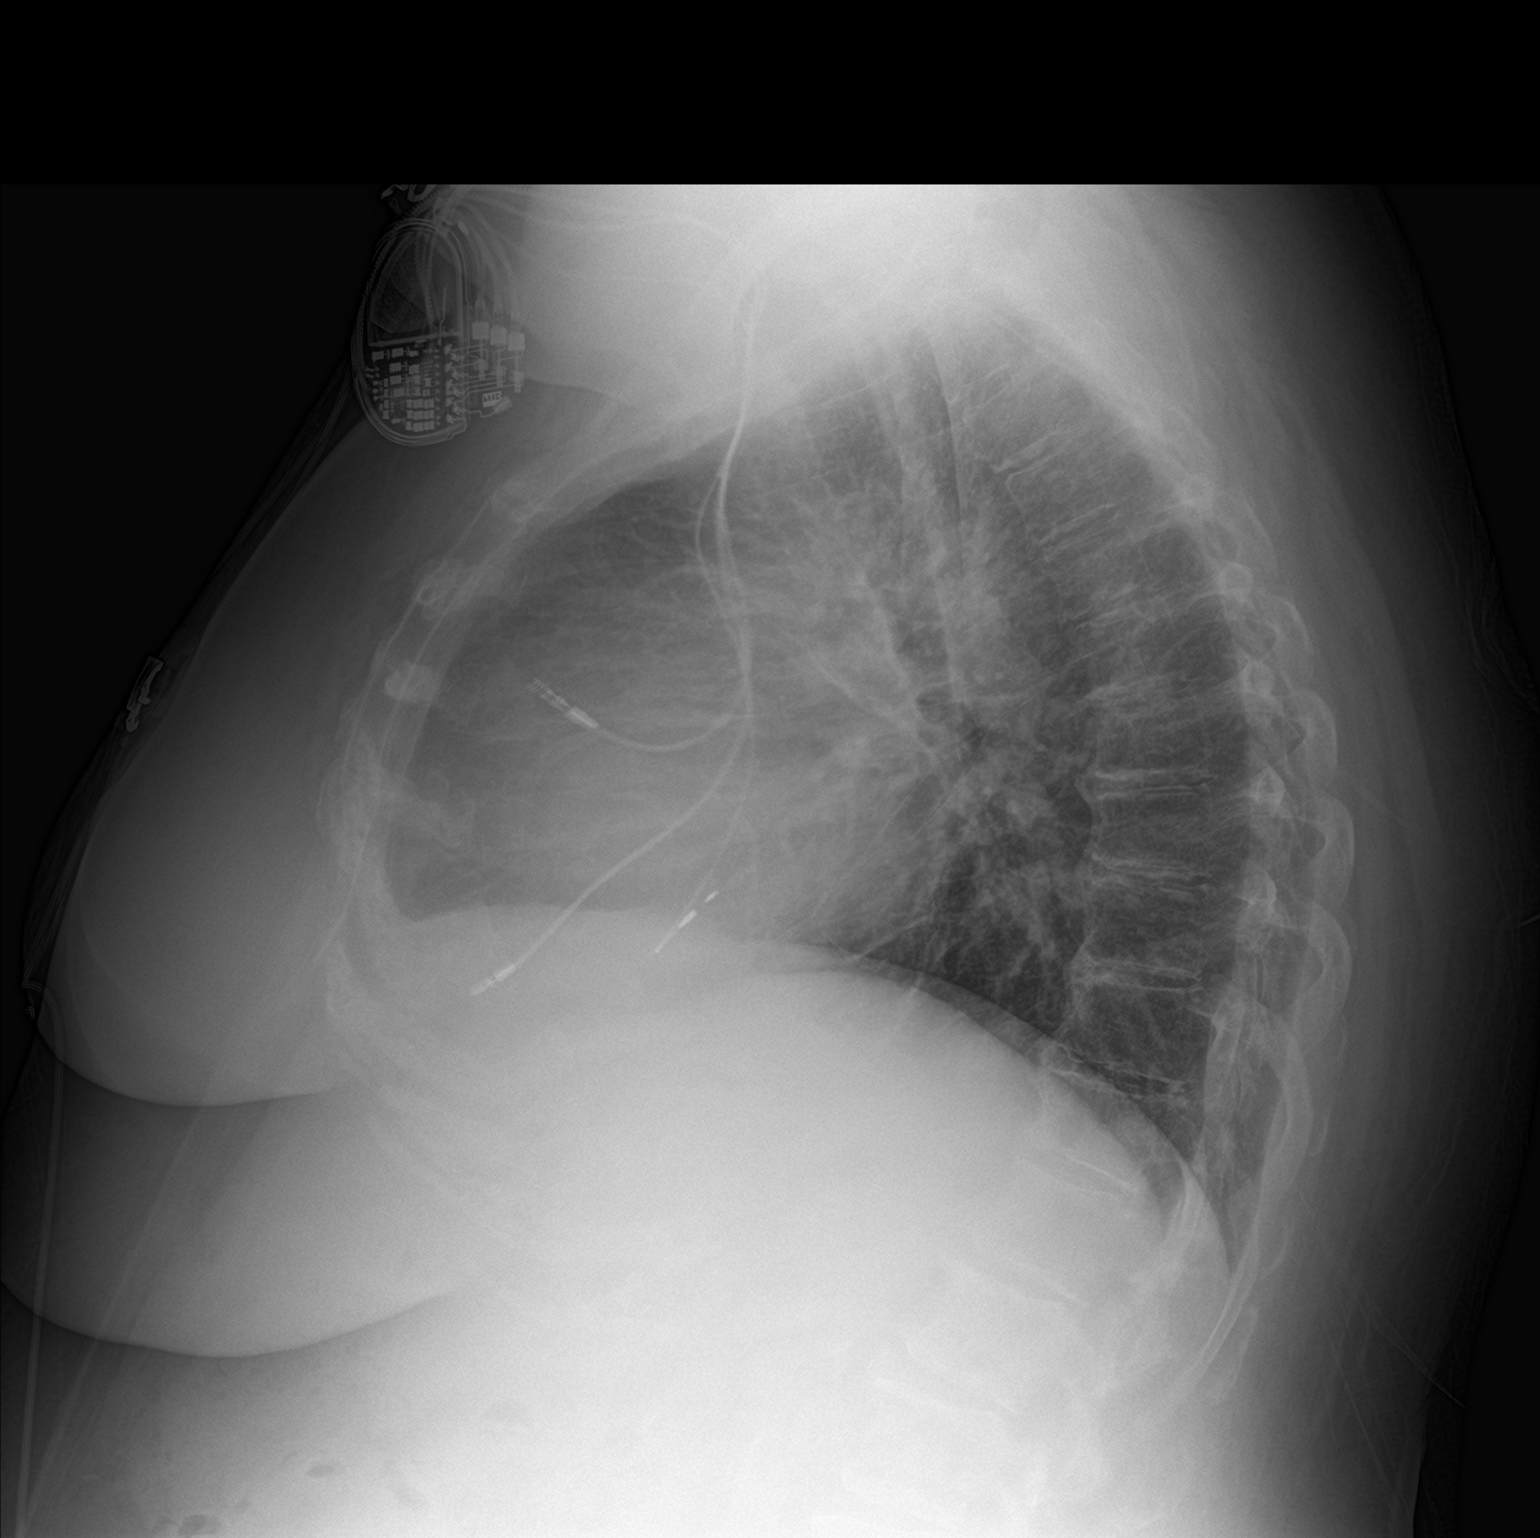

[2 of 2 positions shown; findings below may reference images not displayed]

FINDINGS: The lungs are adequately inflated. There is no focal infiltrate.
There is chronic prominence of the perihilar soft tissues on the
right. There is no pleural effusion. The cardiac silhouette is
enlarged but stable. The pulmonary vascularity is not engorged. The
mediastinum is normal in width. There is calcification in the wall
of the aortic arch. The ICD is in stable position. There is mild
degenerative disc disease of the thoracic spine.
IMPRESSION: Chronic bronchitic changes. Mild perihilar interstitial prominence,
stable. Stable cardiomegaly.

## 2017-12-28 DIAGNOSIS — Z794 Long term (current) use of insulin: Secondary | ICD-10-CM | POA: Diagnosis not present

## 2017-12-28 DIAGNOSIS — I10 Essential (primary) hypertension: Secondary | ICD-10-CM | POA: Diagnosis not present

## 2017-12-28 DIAGNOSIS — E1142 Type 2 diabetes mellitus with diabetic polyneuropathy: Secondary | ICD-10-CM | POA: Diagnosis not present

## 2017-12-28 DIAGNOSIS — E782 Mixed hyperlipidemia: Secondary | ICD-10-CM | POA: Diagnosis not present

## 2018-01-05 ENCOUNTER — Encounter: Payer: Self-pay | Admitting: Medical

## 2018-01-05 MED ORDER — RAMIPRIL 2.5 MG PO CAPS: 2.5000 mg | ORAL_CAPSULE | Freq: Every day | ORAL | 1 refills | Status: DC

## 2018-01-16 ENCOUNTER — Ambulatory Visit: Payer: Self-pay | Admitting: *Deleted

## 2018-01-16 ENCOUNTER — Ambulatory Visit (INDEPENDENT_AMBULATORY_CARE_PROVIDER_SITE_OTHER): Payer: Medicare HMO | Admitting: Family Medicine

## 2018-01-16 ENCOUNTER — Encounter: Payer: Self-pay | Admitting: Family Medicine

## 2018-01-16 VITALS — BP 120/90 | HR 99 | Temp 97.7°F | Wt 227.4 lb

## 2018-01-16 DIAGNOSIS — J209 Acute bronchitis, unspecified: Secondary | ICD-10-CM | POA: Insufficient documentation

## 2018-01-16 DIAGNOSIS — J440 Chronic obstructive pulmonary disease with acute lower respiratory infection: Secondary | ICD-10-CM | POA: Diagnosis not present

## 2018-01-16 MED ORDER — DOXYCYCLINE HYCLATE 100 MG PO TABS: 100.0000 mg | ORAL_TABLET | Freq: Two times a day (BID) | ORAL | 0 refills | Status: DC

## 2018-01-16 MED ORDER — HYDROCODONE-HOMATROPINE 5-1.5 MG/5ML PO SYRP: 5.0000 mL | ORAL_SOLUTION | Freq: Three times a day (TID) | ORAL | 0 refills | Status: DC | PRN

## 2018-01-16 MED ORDER — METHYLPREDNISOLONE SODIUM SUCC 125 MG IJ SOLR
125.0000 mg | Freq: Once | INTRAMUSCULAR | Status: AC
  Administered 2018-01-16: 125 mg via INTRAMUSCULAR

## 2018-01-16 NOTE — Progress Notes (Signed)
ITXEL Alexis [name] - 67 y.o. female MRN [id number]  Date of birth: [birth year]  Subjective Chief Complaint  Patient presents with  . Cough  . Wheezing  . Shortness of Breath    HPI Alexis [name] is a 67 y.o. female with history of COPD/Asthma overlap, T2DM, CAD and a. Fib here today with complaint of cough with associated wheezing, shortness of breath, congestion and mild body aches.  Symptoms started about 4 days ago.  She denies fever, chills, sinus pain, headache, nausea, vomiting, diarrhea.  She has been using albuterol as needed, which is helpful. She is also taking mucinex.  Hycodan cough syrup has been helpful as well during these episodes in the past.   ROS:  A comprehensive ROS was completed and negative except as noted per HPI  Allergies  Allergen Reactions  . Indomethacin Other (See Comments)     Renal Insufficiency  . Pregabalin Other (See Comments)    DIZZINESS   . Sulfa Antibiotics Hives  . Morphine And Related Nausea And Vomiting    Past Medical History:  Diagnosis Date  . Allergy   . Anxiety   . Arthritis   . Asthma   . Atrial fibrillation ([place])   . CHF (congestive heart failure) ([place])   . COPD (chronic obstructive pulmonary disease) ([place])   . Diabetes mellitus without complication ([place])   . Hyperlipidemia   . Hypertension   . Myocardial infarction ([place])    several  . Pacemaker    CRT-P with RV lead and His Bundle lead ( high threshold)   . Peripheral neuropathy   . Pneumonia   . PONV (postoperative nausea and vomiting)    unsure of exactly what happened at [facility] in 2010 (knee surgery) that led to crital care  . Thyroid disease     Past Surgical History:  Procedure Laterality Date  . ABDOMINAL AORTOGRAM W/LOWER EXTREMITY N/A 04/29/2016   Procedure: Abdominal Aortogram w/Lower Extremity;  Surgeon: Waynetta Sandy, MD;  Location: [facility];  Service: Cardiovascular;  Laterality: N/A;  . ABDOMINAL AORTOGRAM W/LOWER  EXTREMITY N/A 05/06/2017   Procedure: ABDOMINAL AORTOGRAM W/LOWER EXTREMITY;  Surgeon: Angelia Mould, MD;  Location: [facility];  Service: Cardiovascular;  Laterality: N/A;  bilateral  . ABDOMINAL HYSTERECTOMY    . AMPUTATION Right 07/30/2016   Procedure: AMPUTATION RIGHT SECOND TOE;  Surgeon: Angelia Mould, MD;  Location: [facility];  Service: Vascular;  Laterality: Right;  . CARDIAC CATHETERIZATION     [facility] at [facility] [address]  . PERIPHERAL VASCULAR ATHERECTOMY Right 04/29/2016   Procedure: Peripheral Vascular Atherectomy-Right Popliteal;  Surgeon: Waynetta Sandy, MD;  Location: [facility];  Service: Cardiovascular;  Laterality: Right;  . PERIPHERAL VASCULAR INTERVENTION Right 04/29/2016   Procedure: Peripheral Vascular Intervention-Right Popliteal;  Surgeon: Waynetta Sandy, MD;  Location: [facility];  Service: Cardiovascular;  Laterality: Right;  POPLITEAL PTA  . RADIOFREQUENCY ABLATION    . TOTAL KNEE ARTHROPLASTY    . TUBAL LIGATION      Social History   Socioeconomic History  . Marital status: Married    Spouse name: Not on file  . Number of children: Not on file  . Years of education: Not on file  . Highest education level: Not on file  Occupational History  . Occupation: retired  Scientific laboratory technician  . Financial resource strain: Not on file  . Food insecurity:    Worry: Not on file  Inability: Not on file  . Transportation needs:    Medical: Not on file    Non-medical: Not on file  Tobacco Use  . Smoking status: Former Smoker    Last attempt to quit: 03/01/1998    Years since quitting: 19.8  . Smokeless tobacco: Never Used  Substance and Sexual Activity  . 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  . Drug use: Yes    Types: Marijuana    Comment: remote use  . Sexual activity: Never  Lifestyle  . Physical activity:    Days per week: Not on  file    Minutes per session: Not on file  . Stress: Not on file  Relationships  . Social connections:    Talks on phone: Not on file    Gets together: Not on file    Attends religious service: Not on file    Active member of club or organization: Not on file    Attends meetings of clubs or organizations: Not on file    Relationship status: Not on file  Other Topics Concern  . Not on file  Social History Narrative  . Not on file    Family History  Problem Relation Age of Onset  . Diabetes Mother   . Hyperlipidemia Mother   . Diabetes Father   . Hyperlipidemia Father   . Heart disease Father        before age 24  . Heart attack Father   . Diabetes Brother   . Hyperlipidemia Brother   . Heart attack Brother     Health Maintenance  Topic Date Due  . MAMMOGRAM  02/11/1999  . COLONOSCOPY  02/11/1999  . PNA vac Low Risk Adult (2 of 2 - PPSV23) 05/11/2017  . FOOT EXAM  06/28/2017  . HEMOGLOBIN A1C  08/24/2017  . OPHTHALMOLOGY EXAM  04/13/2018  . TETANUS/TDAP  12/22/2020  . INFLUENZA VACCINE  Completed  . DEXA SCAN  Completed  . Hepatitis C Screening  Completed    ----------------------------------------------------------------------------------------------------------------------------------------------------------------------------------------------------------------- Physical Exam BP 120/90   Pulse 99   Temp 97.7 F (36.5 C)   Wt 227 lb 6.4 oz (103.1 kg)   SpO2 99%   BMI 41.59 kg/m   Physical Exam  Constitutional: She is oriented to person, place, and time. She appears well-nourished. She does not appear ill. No distress.  HENT:  Head: Normocephalic and atraumatic.  Mouth/Throat: Oropharynx is clear and moist.  Eyes: No scleral icterus.  Neck: Neck supple. No thyromegaly present.  Cardiovascular: Normal rate, regular rhythm and normal heart sounds.  Pulmonary/Chest: Effort normal. She has wheezes.  Lymphadenopathy:    She has no cervical adenopathy.    Neurological: She is alert and oriented to person, place, and time.  Skin: Skin is warm and dry. No rash noted.  Psychiatric: She has a normal mood and affect. Her behavior is normal.    ------------------------------------------------------------------------------------------------------------------------------------------------------------------------------------------------------------------- Assessment and Plan  Acute bronchitis with COPD ([place]) -Injection of solumedrol given today, instructed to monitor glucose closely at home.  -Rx for doxycycline -Continue albuterol as needed.  -Hycodan refill provided, use sparingly.

## 2018-01-16 NOTE — Assessment & Plan Note (Signed)
-  Injection of solumedrol given today, instructed to monitor glucose closely at home.  -Rx for doxycycline -Continue albuterol as needed.  -Hycodan refill provided, use sparingly.

## 2018-01-16 NOTE — Telephone Encounter (Signed)
  Patient is calling to report that she has a lot of congestion that is making it more difficult for her to breath. Patient has COPD and asthma and she is having increased symptoms. Reason for Disposition . [1] Longstanding difficulty breathing (e.g., CHF, COPD, emphysema) AND [2] WORSE than normal  Answer Assessment - Initial Assessment Questions 1. RESPIRATORY STATUS: "Describe your breathing?" (e.g., wheezing, shortness of breath, unable to speak, severe coughing)      Wheezing and SOB with chest congestion 2. ONSET: "When did this breathing problem begin?"      Started on Friday 3. PATTERN "Does the difficult breathing come and go, or has it been constant since it started?"      Constant- patient is using Mucinex which is helping break thing up 4. SEVERITY: "How bad is your breathing?" (e.g., mild, moderate, severe)    - MILD: No SOB at rest, mild SOB with walking, speaks normally in sentences, can lay down, no retractions, pulse < 100.    - MODERATE: SOB at rest, SOB with minimal exertion and prefers to sit, cannot lie down flat, speaks in phrases, mild retractions, audible wheezing, pulse 100-120.    - SEVERE: Very SOB at rest, speaks in single words, struggling to breathe, sitting hunched forward, retractions, pulse > 120      moderate 5. RECURRENT SYMPTOM: "Have you had difficulty breathing before?" If so, ask: "When was the last time?" and "What happened that time?"      Every year- she has COPD/Asthma 6. CARDIAC HISTORY: "Do you have any history of heart disease?" (e.g., heart attack, angina, bypass surgery, angioplasty)      CHF 7. LUNG HISTORY: "Do you have any history of lung disease?"  (e.g., pulmonary embolus, asthma, emphysema)     COPD/[place] 8. CAUSE: "What do you think is causing the breathing problem?"      Chest congestion 9. OTHER SYMPTOMS: "Do you have any other symptoms? (e.g., dizziness, runny nose, cough, chest pain, fever)     Cough, dizziness with coughing 10.  PREGNANCY: "Is there any chance you are pregnant?" "When was your last menstrual period?"       n/a 11. TRAVEL: "Have you traveled out of the country in the last month?" (e.g., travel history, exposures)       n/a  Protocols used: BREATHING DIFFICULTY-A-AH

## 2018-01-16 NOTE — Patient Instructions (Signed)
Chronic Obstructive Pulmonary Disease Exacerbation  Chronic obstructive pulmonary disease (COPD) is a common lung problem. In COPD, the flow of air from the lungs is limited. COPD exacerbations are times that breathing gets worse and you need extra treatment. Without treatment they can be life threatening. If they happen often, your lungs can become more damaged. If your COPD gets worse, your doctor may treat you with:  ? Medicines.  ? Oxygen.  ? Different ways to clear your airway, such as using a mask.    Follow these instructions at home:  ? Do not smoke.  ? Avoid tobacco smoke and other things that bother your lungs.  ? If given, take your antibiotic medicine as told. Finish the medicine even if you start to feel better.  ? Only take medicines as told by your doctor.  ? Drink enough fluids to keep your pee (urine) clear or pale yellow (unless your doctor has told you not to).  ? Use a cool mist machine (vaporizer).  ? If you use oxygen or a machine that turns liquid medicine into a mist (nebulizer), continue to use them as told.  ? Keep up with shots (vaccinations) as told by your doctor.  ? Exercise regularly.  ? Eat healthy foods.  ? Keep all doctor visits as told.  Get help right away if:  ? You are very short of breath and it gets worse.  ? You have trouble talking.  ? You have bad chest pain.  ? You have blood in your spit (sputum).  ? You have a fever.  ? You keep throwing up (vomiting).  ? You feel weak, or you pass out (faint).  ? You feel confused.  ? You keep getting worse.  This information is not intended to replace advice given to you by your health care provider. Make sure you discuss any questions you have with your health care provider.  Document Released: 02/04/2011 Document Revised: 07/24/2015 Document Reviewed: 10/20/2012  Elsevier Interactive Patient Education ? 2017 Elsevier Inc.

## 2018-02-23 ENCOUNTER — Other Ambulatory Visit: Payer: Self-pay | Admitting: Cardiology

## 2018-02-23 NOTE — Telephone Encounter (Signed)
Eliquis 58m refill request received; pt is 67yrs old, wt-103.1 kg, Crea-0.92, last seen by Dr. BShawna Orleanson 11/16/17; will send in refill to requested pharmacy.

## 2018-02-25 DIAGNOSIS — R69 Illness, unspecified: Secondary | ICD-10-CM | POA: Diagnosis not present

## 2018-03-02 ENCOUNTER — Encounter: Payer: Self-pay | Admitting: Medical

## 2018-03-02 ENCOUNTER — Ambulatory Visit: Payer: PPO | Admitting: Medical

## 2018-03-02 ENCOUNTER — Ambulatory Visit (INDEPENDENT_AMBULATORY_CARE_PROVIDER_SITE_OTHER): Payer: HMO | Admitting: Medical

## 2018-03-02 VITALS — BP 168/80 | HR 66 | Temp 98.1°F | Resp 16 | Ht 62.0 in | Wt 231.8 lb

## 2018-03-02 DIAGNOSIS — L089 Local infection of the skin and subcutaneous tissue, unspecified: Secondary | ICD-10-CM | POA: Diagnosis not present

## 2018-03-02 DIAGNOSIS — S41102A Unspecified open wound of left upper arm, initial encounter: Secondary | ICD-10-CM | POA: Diagnosis not present

## 2018-03-02 DIAGNOSIS — S31109A Unspecified open wound of abdominal wall, unspecified quadrant without penetration into peritoneal cavity, initial encounter: Secondary | ICD-10-CM | POA: Diagnosis not present

## 2018-03-02 MED ORDER — DOXYCYCLINE HYCLATE 100 MG PO TABS: 100.0000 mg | ORAL_TABLET | Freq: Two times a day (BID) | ORAL | 0 refills | Status: DC

## 2018-03-02 MED ORDER — MUPIROCIN 2 % EX OINT: TOPICAL_OINTMENT | CUTANEOUS | 0 refills | Status: DC

## 2018-03-02 NOTE — Patient Instructions (Addendum)
For your 2-week left lower abdomen broken down area with a probable infection, I got a wound culture of the area and prescribe doxycycline antibiotic.  I do think you need to start using either large Square gauze/bandage over this area to reduce friction rubbing.  You might try some moleskin as well.  I would recommend lying supine at night and lifting your abdomen so that area in fat fold can get some air/breathe.  This can promote healing.  Also can apply mupirocin twice daily to lower abdomen skin broken down region.  For your chronic left forearm wound, I went ahead and placed wound care center referral.  They can give their opinion on this area as well as give their opinion on the abdomen wound.  I will follow a wound culture and let you know the results.  If you start to form an abscess in left lower abdomen region let me know and would refer you to general surgeon for I&D.  If formation of abscess after hours and severe pain then recommend ED evaluation.  No abscess is present currently.  I do not think you have a shingles presently.  Follow-up in 7 days or as needed.

## 2018-03-02 NOTE — Progress Notes (Signed)
Subjective:    Patient ID: Alexis [name], female    DOB: 09-26-1950, 68 y.o.   MRN: [id number]  HPI  Pt in with rash on her abdomen with broken down area of skin. For past 2 weeks. Pt has appointment with dermatologist on March 07, 2017. She scheduled that herself.  Pt has occasional yeast infection in lower abdomen fat fold.  Pt is known diabetic. Pt is seeing endocrinologist later this month.   Pt forearm has not been healing.    Review of Systems  Constitutional: Negative for chills and fatigue.  Respiratory: Negative for cough, chest tightness, shortness of breath and wheezing.   Skin:       Left lower abdomen broken down area. See hpi.  Forearm rash  Neurological: Negative for dizziness, weakness, numbness and headaches.  Hematological: Negative for adenopathy. Does not bruise/bleed easily.  Psychiatric/Behavioral: Negative for behavioral problems and confusion.    Past Medical History:  Diagnosis Date  . Allergy   . Anxiety   . Arthritis   . Asthma   . Atrial fibrillation ([place])   . CHF (congestive heart failure) ([place])   . COPD (chronic obstructive pulmonary disease) ([place])   . Diabetes mellitus without complication ([place])   . Hyperlipidemia   . Hypertension   . Myocardial infarction ([place])    several  . Pacemaker    CRT-P with RV lead and His Bundle lead ( high threshold)   . Peripheral neuropathy   . Pneumonia   . PONV (postoperative nausea and vomiting)    unsure of exactly what happened at [facility] in 2010 (knee surgery) that led to crital care  . Thyroid disease      Social History   Socioeconomic History  . Marital status: Married    Spouse name: Not on file  . Number of children: Not on file  . Years of education: Not on file  . Highest education level: Not on file  Occupational History  . Occupation: retired  Scientific laboratory technician  . Financial resource strain: Not on file  . Food insecurity:    Worry: Not on file    Inability: Not on  file  . Transportation needs:    Medical: Not on file    Non-medical: Not on file  Tobacco Use  . Smoking status: Former Smoker    Last attempt to quit: 03/01/1998    Years since quitting: 20.0  . Smokeless tobacco: Never Used  Substance and Sexual Activity  . 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  . Drug use: Yes    Types: Marijuana    Comment: remote use  . Sexual activity: Never  Lifestyle  . Physical activity:    Days per week: Not on file    Minutes per session: Not on file  . Stress: Not on file  Relationships  . Social connections:    Talks on phone: Not on file    Gets together: Not on file    Attends religious service: Not on file    Active member of club or organization: Not on file    Attends meetings of clubs or organizations: Not on file    Relationship status: Not on file  . Intimate partner violence:    Fear of current or ex partner: Not on file    Emotionally abused: Not on file    Physically abused: Not on file  Forced sexual activity: Not on file  Other Topics Concern  . Not on file  Social History Narrative  . Not on file    Past Surgical History:  Procedure Laterality Date  . ABDOMINAL AORTOGRAM W/LOWER EXTREMITY N/A 04/29/2016   Procedure: Abdominal Aortogram w/Lower Extremity;  Surgeon: Waynetta Sandy, MD;  Location: [facility];  Service: Cardiovascular;  Laterality: N/A;  . ABDOMINAL AORTOGRAM W/LOWER EXTREMITY N/A 05/06/2017   Procedure: ABDOMINAL AORTOGRAM W/LOWER EXTREMITY;  Surgeon: Angelia Mould, MD;  Location: [facility];  Service: Cardiovascular;  Laterality: N/A;  bilateral  . ABDOMINAL HYSTERECTOMY    . AMPUTATION Right 07/30/2016   Procedure: AMPUTATION RIGHT SECOND TOE;  Surgeon: Angelia Mould, MD;  Location: [facility];  Service: Vascular;  Laterality: Right;  . CARDIAC CATHETERIZATION     2005 at [facility]    . PERIPHERAL VASCULAR ATHERECTOMY Right 04/29/2016   Procedure: Peripheral Vascular Atherectomy-Right Popliteal;  Surgeon: Waynetta Sandy, MD;  Location: [facility];  Service: Cardiovascular;  Laterality: Right;  . PERIPHERAL VASCULAR INTERVENTION Right 04/29/2016   Procedure: Peripheral Vascular Intervention-Right Popliteal;  Surgeon: Waynetta Sandy, MD;  Location: [facility];  Service: Cardiovascular;  Laterality: Right;  POPLITEAL PTA  . RADIOFREQUENCY ABLATION    . TOTAL KNEE ARTHROPLASTY    . TUBAL LIGATION      Family History  Problem Relation Age of Onset  . Diabetes Mother   . Hyperlipidemia Mother   . Diabetes Father   . Hyperlipidemia Father   . Heart disease Father        before age 14  . Heart attack Father   . Diabetes Brother   . Hyperlipidemia Brother   . Heart attack Brother     Allergies  Allergen Reactions  . Indomethacin Other (See Comments)     Renal Insufficiency  . Pregabalin Other (See Comments)    DIZZINESS   . Sulfa Antibiotics Hives  . Morphine And Related Nausea And Vomiting    Current Outpatient Medications on File Prior to Visit  Medication Sig Dispense Refill  . albuterol (PROVENTIL HFA;VENTOLIN HFA) 108 (90 Base) MCG/ACT inhaler Inhale 2 puffs into the lungs every 6 (six) hours as needed for wheezing or shortness of breath. 1 Inhaler 2  . alendronate (FOSAMAX) 10 MG tablet Take 1 tablet (10 mg total) by mouth daily before breakfast. Take with a full glass of water on an empty stomach. 30 tablet 11  . allopurinol (ZYLOPRIM) 100 MG tablet Take 1 tablet (100 mg total) by mouth 2 (two) times daily. 60 tablet 3  . AMBULATORY NON FORMULARY MEDICATION Motorized scooter.  Diagnosis: Osteoarthritis M19.90 1 each 0  . Blood Glucose Monitoring Suppl (GLUCOCOM BLOOD GLUCOSE MONITOR) DEVI Use to check blood sugars 6 times daily E11.65    . DULoxetine (CYMBALTA) 30 MG capsule Take 1 capsule (30 mg total) by mouth at bedtime.  90 capsule 1  . ELIQUIS 5 MG TABS tablet TAKE ONE (1) TABLET BY MOUTH TWO (2) TIMES DAILY 180 tablet 2  . fluticasone (FLONASE) 50 MCG/ACT nasal spray Place 2 sprays into both nostrils at bedtime. (Patient taking differently: Place 2 sprays into both nostrils as needed. )    . fluticasone (FLOVENT HFA) 110 MCG/ACT inhaler Inhale 2 puffs into the lungs 2 (two) times daily as needed (bronchitis).    Marland Kitchen HYDROcodone-homatropine (HYCODAN) 5-1.5 MG/5ML syrup Take 5 mLs by mouth every 8 (eight) hours as needed. [place]  mL 0  . Insulin Disposable Pump (V-GO 40) KIT Inject into the skin See admin instructions. Use with Novolog - 6 pumps before each meal    . levocetirizine (XYZAL) 5 MG tablet Take 1 tablet (5 mg total) by mouth every evening. 30 tablet 0  . Levothyroxine Sodium 150 MCG CAPS Take 1 capsule (150 mcg total) by mouth daily before breakfast. 30 capsule 3  . metoprolol succinate (TOPROL-XL) 25 MG 24 hr tablet Take 1 tablet (25 mg total) by mouth daily. 90 tablet 1  . NOVOLOG 100 UNIT/ML injection 100 units per day in VGO unknown bolus    . ondansetron (ZOFRAN ODT) 8 MG disintegrating tablet Take 1 tablet (8 mg total) by mouth every 8 (eight) hours as needed for nausea or vomiting. 20 tablet 0  . Prenatal Vit-Fe Fumarate-FA (MULTIVITAMIN-PRENATAL) 27-0.8 MG TABS tablet Take 1 tablet by mouth daily. Takes for nail and hair growth     . ramipril (ALTACE) 2.5 MG capsule Take 1 capsule (2.5 mg total) by mouth at bedtime. 90 capsule 1  . Semaglutide 0.25 or 0.5 MG/DOSE SOPN Inject 0.33m weekly    . torsemide (DEMADEX) 10 MG tablet Take 1 tablet (10 mg total) by mouth daily. 90 tablet 1  . atorvastatin (LIPITOR) 40 MG tablet Take 1 tablet (40 mg total) by mouth daily at 6 PM. 90 tablet 3   Current Facility-Administered Medications on File Prior to Visit  Medication Dose Route Frequency Provider Last Rate Last Dose  . acetaminophen (TYLENOL) solution 650 mg  650 mg Oral Q6H PRN DAngelia Mould MD         BP (!) 168/80   Pulse 66   Temp 98.1 F (36.7 C) (Oral)   Resp 16   Ht 5' 2"  (1.575 m)   Wt 231 lb 12.8 oz (105.1 kg)   SpO2 98%   BMI 42.40 kg/m       Objective:   Physical Exam  General- No acute distress. Pleasant patient. Neck- Full range of motion, no jvd Lungs- Clear, even and unlabored. Heart- regular rate and rhythm. Neurologic- CNII- XII grossly intact.  Skin- lower abdomen left lower side under large pendulous are of fat show shallow 1 cm  Wide breakdown. Mild yellow dc(but no obvious abscess formed. Left forearm- skin flap that is bleeding. Skin looks bruised.        Assessment & Plan:  For your 2-week left lower abdomen broken down area with a probable infection, I got a wound culture of the area and prescribe doxycycline antibiotic.  I do think you need to start using either large Square gauze/bandage over this area to reduce friction rubbing.  You might try some moleskin as well.  I would recommend lying supine at night and lifting your abdomen so that area in fat fold can get some air/breathe.  This can promote healing.  Also can apply mupirocin twice daily to lower abdomen skin broken down region.  For your chronic left forearm wound, I went ahead and placed wound care center referral.  They can give their opinion on this area as well as give their opinion on the abdomen wound.  I will follow a wound culture and let you know the results.  If you start to form an abscess in left lower abdomen region let me know and would refer you to general surgeon for I&D.  If formation of abscess after hours and severe pain then recommend ED evaluation.  No abscess is present currently.  I do not think you have a shingles presently.  Follow-up in 7 days or as needed.  Mackie Pai, PA-C

## 2018-03-05 ENCOUNTER — Encounter: Payer: Self-pay | Admitting: Medical

## 2018-03-05 LAB — WOUND CULTURE
MICRO NUMBER:: 3051
SPECIMEN QUALITY:: ADEQUATE

## 2018-03-07 DIAGNOSIS — L304 Erythema intertrigo: Secondary | ICD-10-CM | POA: Diagnosis not present

## 2018-03-09 ENCOUNTER — Ambulatory Visit: Payer: PPO | Admitting: Medical

## 2018-03-09 ENCOUNTER — Telehealth: Payer: Self-pay | Admitting: *Deleted

## 2018-03-09 NOTE — Telephone Encounter (Signed)
Received Physician Orders from [facility] for Chronic Condition Verification; forwarded to provider/SLS

## 2018-03-15 ENCOUNTER — Ambulatory Visit: Payer: Medicare HMO | Admitting: Vascular Surgery

## 2018-03-16 ENCOUNTER — Other Ambulatory Visit: Payer: Self-pay

## 2018-03-16 DIAGNOSIS — M109 Gout, unspecified: Secondary | ICD-10-CM

## 2018-03-16 MED ORDER — ALLOPURINOL 100 MG PO TABS: 100.0000 mg | ORAL_TABLET | Freq: Two times a day (BID) | ORAL | 3 refills | Status: DC

## 2018-03-24 ENCOUNTER — Encounter (HOSPITAL_BASED_OUTPATIENT_CLINIC_OR_DEPARTMENT_OTHER): Payer: HMO | Attending: Internal Medicine

## 2018-03-24 ENCOUNTER — Encounter (HOSPITAL_BASED_OUTPATIENT_CLINIC_OR_DEPARTMENT_OTHER): Payer: Self-pay

## 2018-03-24 DIAGNOSIS — E1142 Type 2 diabetes mellitus with diabetic polyneuropathy: Secondary | ICD-10-CM | POA: Diagnosis not present

## 2018-03-24 DIAGNOSIS — Z950 Presence of cardiac pacemaker: Secondary | ICD-10-CM | POA: Diagnosis not present

## 2018-03-24 DIAGNOSIS — J449 Chronic obstructive pulmonary disease, unspecified: Secondary | ICD-10-CM | POA: Diagnosis not present

## 2018-03-24 DIAGNOSIS — I890 Lymphedema, not elsewhere classified: Secondary | ICD-10-CM | POA: Diagnosis not present

## 2018-03-24 DIAGNOSIS — I4891 Unspecified atrial fibrillation: Secondary | ICD-10-CM | POA: Diagnosis not present

## 2018-03-24 DIAGNOSIS — Z7901 Long term (current) use of anticoagulants: Secondary | ICD-10-CM | POA: Diagnosis not present

## 2018-03-24 DIAGNOSIS — I10 Essential (primary) hypertension: Secondary | ICD-10-CM | POA: Insufficient documentation

## 2018-03-24 DIAGNOSIS — L304 Erythema intertrigo: Secondary | ICD-10-CM | POA: Insufficient documentation

## 2018-03-24 DIAGNOSIS — X58XXXA Exposure to other specified factors, initial encounter: Secondary | ICD-10-CM | POA: Insufficient documentation

## 2018-03-24 DIAGNOSIS — Z794 Long term (current) use of insulin: Secondary | ICD-10-CM | POA: Insufficient documentation

## 2018-03-24 DIAGNOSIS — S31104A Unspecified open wound of abdominal wall, left lower quadrant without penetration into peritoneal cavity, initial encounter: Secondary | ICD-10-CM | POA: Insufficient documentation

## 2018-03-24 DIAGNOSIS — L97514 Non-pressure chronic ulcer of other part of right foot with necrosis of bone: Secondary | ICD-10-CM | POA: Diagnosis not present

## 2018-03-24 DIAGNOSIS — E1151 Type 2 diabetes mellitus with diabetic peripheral angiopathy without gangrene: Secondary | ICD-10-CM | POA: Insufficient documentation

## 2018-03-24 DIAGNOSIS — I70235 Atherosclerosis of native arteries of right leg with ulceration of other part of foot: Secondary | ICD-10-CM | POA: Diagnosis not present

## 2018-03-24 DIAGNOSIS — Z89421 Acquired absence of other right toe(s): Secondary | ICD-10-CM | POA: Insufficient documentation

## 2018-03-27 DIAGNOSIS — L97519 Non-pressure chronic ulcer of other part of right foot with unspecified severity: Secondary | ICD-10-CM | POA: Diagnosis not present

## 2018-03-31 DIAGNOSIS — E118 Type 2 diabetes mellitus with unspecified complications: Secondary | ICD-10-CM | POA: Diagnosis not present

## 2018-03-31 DIAGNOSIS — Z794 Long term (current) use of insulin: Secondary | ICD-10-CM | POA: Diagnosis not present

## 2018-03-31 DIAGNOSIS — E782 Mixed hyperlipidemia: Secondary | ICD-10-CM | POA: Diagnosis not present

## 2018-03-31 DIAGNOSIS — S31104A Unspecified open wound of abdominal wall, left lower quadrant without penetration into peritoneal cavity, initial encounter: Secondary | ICD-10-CM | POA: Diagnosis not present

## 2018-03-31 DIAGNOSIS — E1142 Type 2 diabetes mellitus with diabetic polyneuropathy: Secondary | ICD-10-CM | POA: Diagnosis not present

## 2018-03-31 DIAGNOSIS — L97519 Non-pressure chronic ulcer of other part of right foot with unspecified severity: Secondary | ICD-10-CM | POA: Diagnosis not present

## 2018-03-31 DIAGNOSIS — I10 Essential (primary) hypertension: Secondary | ICD-10-CM | POA: Diagnosis not present

## 2018-04-01 ENCOUNTER — Encounter: Payer: Self-pay | Admitting: Medical

## 2018-04-05 ENCOUNTER — Encounter: Payer: Self-pay | Admitting: Medical

## 2018-04-05 ENCOUNTER — Ambulatory Visit (INDEPENDENT_AMBULATORY_CARE_PROVIDER_SITE_OTHER): Payer: HMO | Admitting: Medical

## 2018-04-05 VITALS — BP 134/49 | HR 52 | Temp 97.8°F | Resp 14 | Ht 62.0 in | Wt 227.0 lb

## 2018-04-05 DIAGNOSIS — T148XXD Other injury of unspecified body region, subsequent encounter: Secondary | ICD-10-CM

## 2018-04-05 DIAGNOSIS — M792 Neuralgia and neuritis, unspecified: Secondary | ICD-10-CM | POA: Diagnosis not present

## 2018-04-05 MED ORDER — AMITRIPTYLINE HCL 25 MG PO TABS: ORAL_TABLET | ORAL | 0 refills | Status: DC

## 2018-04-05 NOTE — Progress Notes (Signed)
Subjective:    Patient ID: Alexis [name], female    DOB: Sep 08, 1950, 68 y.o.   MRN: [id number]  HPI  Pt in for follow up.  Pt in for follow up. Pt has been seeing dermatolgist and wound care for left lower abdomen wound in fat fold.   Pt states she will see wound care MD this coming Friday.  Pt states specialist sprayed wound area with something that helped. Then pt states collagen pad placed over some areas that did help. But recenlty she states some skin peeling.   Recently had tried silvadene that cause severe burning sensation.  Pt saw her endocrinologist last week. Her a1c was 8.   Pt states her lower abdomen crease below wound causes severe burning pain. She states like nerve pain. Pt states in past did not do well with tramadol. Pt states gabapentin causes feet swelling. Never tried elavil before. Pain is mostly at night.   Review of Systems  Constitutional: Negative for chills, fatigue and fever.  Respiratory: Negative for chest tightness, shortness of breath, wheezing and stridor.   Cardiovascular: Negative for chest pain and palpitations.  Gastrointestinal: Negative for abdominal pain.  Musculoskeletal: Negative for back pain.  Skin: Negative for rash.       Left lower abdomen. 2 small broken down areas. Slowly healing.  Neurological: Negative for dizziness, numbness and headaches.  Hematological: Negative for adenopathy. Does not bruise/bleed easily.  Psychiatric/Behavioral: Negative for behavioral problems, confusion, sleep disturbance and suicidal ideas. The patient is not nervous/anxious.     Past Medical History:  Diagnosis Date  . Allergy   . Anxiety   . Arthritis   . Asthma   . Atrial fibrillation ([place])   . CHF (congestive heart failure) ([place])   . COPD (chronic obstructive pulmonary disease) ([place])   . Diabetes mellitus without complication ([place])   . Hyperlipidemia   . Hypertension   . Myocardial infarction ([place])    several  . Pacemaker    CRT-P  with RV lead and His Bundle lead ( high threshold)   . Peripheral neuropathy   . Pneumonia   . PONV (postoperative nausea and vomiting)    unsure of exactly what happened at [facility] in 2010 (knee surgery) that led to crital care  . Thyroid disease      Social History   Socioeconomic History  . Marital status: Married    Spouse name: Not on file  . Number of children: Not on file  . Years of education: Not on file  . Highest education level: Not on file  Occupational History  . Occupation: retired  Scientific laboratory technician  . Financial resource strain: Not on file  . Food insecurity:    Worry: Not on file    Inability: Not on file  . Transportation needs:    Medical: Not on file    Non-medical: Not on file  Tobacco Use  . Smoking status: Former Smoker    Last attempt to quit: 03/01/1998    Years since quitting: 20.1  . Smokeless tobacco: Never Used  Substance and Sexual Activity  . 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  . Drug use: Yes    Types: Marijuana    Comment: remote use  . Sexual activity: Never  Lifestyle  . Physical activity:    Days per week: Not on file    Minutes per session:  Not on file  . Stress: Not on file  Relationships  . Social connections:    Talks on phone: Not on file    Gets together: Not on file    Attends religious service: Not on file    Active member of club or organization: Not on file    Attends meetings of clubs or organizations: Not on file    Relationship status: Not on file  . Intimate partner violence:    Fear of current or ex partner: Not on file    Emotionally abused: Not on file    Physically abused: Not on file    Forced sexual activity: Not on file  Other Topics Concern  . Not on file  Social History Narrative  . Not on file    Past Surgical History:  Procedure Laterality Date  . ABDOMINAL AORTOGRAM W/LOWER EXTREMITY N/A 04/29/2016    Procedure: Abdominal Aortogram w/Lower Extremity;  Surgeon: Brandon Christopher Cain, MD;  Location: [facility];  Service: Cardiovascular;  Laterality: N/A;  . ABDOMINAL AORTOGRAM W/LOWER EXTREMITY N/A 05/06/2017   Procedure: ABDOMINAL AORTOGRAM W/LOWER EXTREMITY;  Surgeon: Dickson, Christopher S, MD;  Location: [facility];  Service: Cardiovascular;  Laterality: N/A;  bilateral  . ABDOMINAL HYSTERECTOMY    . AMPUTATION Right 07/30/2016   Procedure: AMPUTATION RIGHT SECOND TOE;  Surgeon: Dickson, Christopher S, MD;  Location: [facility] OR;  Service: Vascular;  Laterality: Right;  . CARDIAC CATHETERIZATION     2005 at [facility]  . PERIPHERAL VASCULAR ATHERECTOMY Right 04/29/2016   Procedure: Peripheral Vascular Atherectomy-Right Popliteal;  Surgeon: Brandon Christopher Cain, MD;  Location: [facility];  Service: Cardiovascular;  Laterality: Right;  . PERIPHERAL VASCULAR INTERVENTION Right 04/29/2016   Procedure: Peripheral Vascular Intervention-Right Popliteal;  Surgeon: Brandon Christopher Cain, MD;  Location: [facility];  Service: Cardiovascular;  Laterality: Right;  POPLITEAL PTA  . RADIOFREQUENCY ABLATION    . TOTAL KNEE ARTHROPLASTY    . TUBAL LIGATION      Family History  Problem Relation Age of Onset  . Diabetes Mother   . Hyperlipidemia Mother   . Diabetes Father   . Hyperlipidemia Father   . Heart disease Father        before age 60  . Heart attack Father   . Diabetes Brother   . Hyperlipidemia Brother   . Heart attack Brother     Allergies  Allergen Reactions  . Indomethacin Other (See Comments)     Renal Insufficiency  . Pregabalin Other (See Comments)    DIZZINESS   . Sulfa Antibiotics Hives  . Morphine And Related Nausea And Vomiting    Current Outpatient Medications on File Prior to Visit  Medication Sig Dispense Refill  . albuterol (PROVENTIL HFA;VENTOLIN HFA) 108 (90 Base) MCG/ACT inhaler Inhale 2 puffs into the lungs every 6 (six)  hours as needed for wheezing or shortness of breath. 1 Inhaler 2  . alendronate (FOSAMAX) 10 MG tablet Take 1 tablet (10 mg total) by mouth daily before breakfast. Take with a full glass of water on an empty stomach. 30 tablet 11  . allopurinol (ZYLOPRIM) 100 MG tablet Take 1 tablet (100 mg total) by mouth 2 (two) times daily. 60 tablet 3  . AMBULATORY NON FORMULARY MEDICATION Motorized scooter.  Diagnosis: Osteoarthritis M19.90 1 each 0  . atorvastatin (LIPITOR) 40 MG tablet Take 1 tablet (40 mg total) by mouth daily at 6 PM. 90 tablet 3  . Blood Glucose   Monitoring Suppl (GLUCOCOM BLOOD GLUCOSE MONITOR) DEVI Use to check blood sugars 6 times daily E11.65    . doxycycline (VIBRA-TABS) 100 MG tablet Take 1 tablet (100 mg total) by mouth 2 (two) times daily. Can give caps or generic 20 tablet 0  . DULoxetine (CYMBALTA) 30 MG capsule Take 1 capsule (30 mg total) by mouth at bedtime. 90 capsule 1  . ELIQUIS 5 MG TABS tablet TAKE ONE (1) TABLET BY MOUTH TWO (2) TIMES DAILY 180 tablet 2  . fluticasone (FLONASE) 50 MCG/ACT nasal spray Place 2 sprays into both nostrils at bedtime. (Patient taking differently: Place 2 sprays into both nostrils as needed. )    . fluticasone (FLOVENT HFA) 110 MCG/ACT inhaler Inhale 2 puffs into the lungs 2 (two) times daily as needed (bronchitis).    Marland Kitchen HYDROcodone-homatropine (HYCODAN) 5-1.5 MG/5ML syrup Take 5 mLs by mouth every 8 (eight) hours as needed. 125 mL 0  . Insulin Disposable Pump (V-GO 40) KIT Inject into the skin See admin instructions. Use with Novolog - 6 pumps before each meal    . levocetirizine (XYZAL) 5 MG tablet Take 1 tablet (5 mg total) by mouth every evening. 30 tablet 0  . Levothyroxine Sodium 150 MCG CAPS Take 1 capsule (150 mcg total) by mouth daily before breakfast. 30 capsule 3  . metoprolol succinate (TOPROL-XL) 25 MG 24 hr tablet Take 1 tablet (25 mg total) by mouth daily. 90 tablet 1  . mupirocin ointment (BACTROBAN) 2 % Apply thin film twice  daily 22 g 0  . NOVOLOG 100 UNIT/ML injection 100 units per day in VGO unknown bolus    . ondansetron (ZOFRAN ODT) 8 MG disintegrating tablet Take 1 tablet (8 mg total) by mouth every 8 (eight) hours as needed for nausea or vomiting. 20 tablet 0  . Prenatal Vit-Fe Fumarate-FA (MULTIVITAMIN-PRENATAL) 27-0.8 MG TABS tablet Take 1 tablet by mouth daily. Takes for nail and hair growth     . ramipril (ALTACE) 2.5 MG capsule Take 1 capsule (2.5 mg total) by mouth at bedtime. 90 capsule 1  . Semaglutide 0.25 or 0.5 MG/DOSE SOPN Inject 0.36m weekly    . torsemide (DEMADEX) 10 MG tablet Take 1 tablet (10 mg total) by mouth daily. 90 tablet 1   Current Facility-Administered Medications on File Prior to Visit  Medication Dose Route Frequency Provider Last Rate Last Dose  . acetaminophen (TYLENOL) solution 650 mg  650 mg Oral Q6H PRN DAngelia Mould MD        BP (!) 134/49   Pulse (!) 52   Temp 97.8 F (36.6 C)   Resp 14   Ht 5' 2" (1.575 m)   Wt 227 lb (103 kg)   SpO2 99%   BMI 41.52 kg/m       Objective:   Physical Exam  .General Mental Status- Alert. General Appearance- Not in acute distress.   Skin General: Color- Normal Color. Moisture- Normal Moisture.  Neck Carotid Arteries- Normal color. Moisture- Normal Moisture. No carotid bruits. No JVD.  Chest and Lung Exam Auscultation: Breath Sounds:-Normal.  Cardiovascular Auscultation:Rythm- Regular. Murmurs & Other Heart Sounds:Auscultation of the heart reveals- No Murmurs.  Abdomen Inspection:-Inspeection Normal. Palpation/Percussion:Note:No mass. Palpation and Percussion of the abdomen reveal- Non Tender, Non Distended + BS, no rebound or guarding.    Neurologic Cranial Nerve exam:- CN III-XII intact(No nystagmus), symmetric smile. Strength:- 5/5 equal and symmetric strength both upper and lower extremities.  Derm- underneath abdomen, pendulous obese abdomen has 2 small areas  of break down about  8-9 mm wide. But  very shallow. Some improvement since seeing wound care.(abdomen is tender in fold of abdomen/pelvic area. No redness, no warmth or induration)      Assessment & Plan:  It does appear that the left lower abdomen area wounds have improved incrementally.  I do not think that the wound is as wide as it was before nor is deep.  2 small areas that are still open.  I would recommend that you continue to follow-up with wound care on a weekly basis.  I think that friction is 1 of the main factors in healing.  I try to keep your blood sugars under control.  Follow advice of wound care.  I do think you have some neuropathic region pain in this area as well.  Historically you did not do well with Lyrica or gabapentin.  States her pain is mostly at night, I did prescribe you Elavil to take 1 to 2 tablets at hour of sleep.  I try just using 1 tablet first and advance to 2 tablets if needed.  Follow-up with me in 3 to 4 weeks.  Mackie Pai, PA-C

## 2018-04-05 NOTE — Patient Instructions (Signed)
It does appear that the left lower abdomen area wounds have improved incrementally.  I do not think that the wound is as wide as it was before nor is deep.  2 small areas that are still open.  I would recommend that you continue to follow-up with wound care on a weekly basis.  I think that friction is 1 of the main factors in healing.  I try to keep your blood sugars under control.  Follow advice of wound care.  I do think you have some neuropathic region pain in this area as well.  Historically you did not do well with Lyrica or gabapentin.  States her pain is mostly at night, I did prescribe you Elavil to take 1 to 2 tablets at hour of sleep.  I try just using 1 tablet first and advance to 2 tablets if needed.  Follow-up with me in 3 to 4 weeks.

## 2018-04-07 ENCOUNTER — Encounter (HOSPITAL_BASED_OUTPATIENT_CLINIC_OR_DEPARTMENT_OTHER): Payer: HMO | Attending: Internal Medicine

## 2018-04-07 DIAGNOSIS — I509 Heart failure, unspecified: Secondary | ICD-10-CM | POA: Insufficient documentation

## 2018-04-07 DIAGNOSIS — X58XXXA Exposure to other specified factors, initial encounter: Secondary | ICD-10-CM | POA: Insufficient documentation

## 2018-04-07 DIAGNOSIS — I110 Hypertensive heart disease with heart failure: Secondary | ICD-10-CM | POA: Insufficient documentation

## 2018-04-07 DIAGNOSIS — I890 Lymphedema, not elsewhere classified: Secondary | ICD-10-CM | POA: Insufficient documentation

## 2018-04-07 DIAGNOSIS — S31104A Unspecified open wound of abdominal wall, left lower quadrant without penetration into peritoneal cavity, initial encounter: Secondary | ICD-10-CM | POA: Insufficient documentation

## 2018-04-07 DIAGNOSIS — L304 Erythema intertrigo: Secondary | ICD-10-CM | POA: Insufficient documentation

## 2018-04-07 DIAGNOSIS — E1151 Type 2 diabetes mellitus with diabetic peripheral angiopathy without gangrene: Secondary | ICD-10-CM | POA: Insufficient documentation

## 2018-04-07 DIAGNOSIS — J449 Chronic obstructive pulmonary disease, unspecified: Secondary | ICD-10-CM | POA: Insufficient documentation

## 2018-04-08 ENCOUNTER — Encounter: Payer: Self-pay | Admitting: Medical

## 2018-04-10 ENCOUNTER — Telehealth: Payer: Self-pay | Admitting: Medical

## 2018-04-10 DIAGNOSIS — E65 Localized adiposity: Secondary | ICD-10-CM | POA: Diagnosis not present

## 2018-04-10 DIAGNOSIS — S31104A Unspecified open wound of abdominal wall, left lower quadrant without penetration into peritoneal cavity, initial encounter: Secondary | ICD-10-CM | POA: Diagnosis not present

## 2018-04-10 DIAGNOSIS — E1151 Type 2 diabetes mellitus with diabetic peripheral angiopathy without gangrene: Secondary | ICD-10-CM | POA: Diagnosis not present

## 2018-04-10 DIAGNOSIS — I110 Hypertensive heart disease with heart failure: Secondary | ICD-10-CM | POA: Diagnosis not present

## 2018-04-10 DIAGNOSIS — J449 Chronic obstructive pulmonary disease, unspecified: Secondary | ICD-10-CM | POA: Diagnosis not present

## 2018-04-10 DIAGNOSIS — I509 Heart failure, unspecified: Secondary | ICD-10-CM | POA: Diagnosis not present

## 2018-04-10 DIAGNOSIS — X58XXXA Exposure to other specified factors, initial encounter: Secondary | ICD-10-CM | POA: Diagnosis not present

## 2018-04-10 DIAGNOSIS — L304 Erythema intertrigo: Secondary | ICD-10-CM | POA: Diagnosis not present

## 2018-04-10 DIAGNOSIS — I890 Lymphedema, not elsewhere classified: Secondary | ICD-10-CM | POA: Diagnosis not present

## 2018-04-10 NOTE — Telephone Encounter (Signed)
DC elavil.

## 2018-04-11 DIAGNOSIS — L97519 Non-pressure chronic ulcer of other part of right foot with unspecified severity: Secondary | ICD-10-CM | POA: Diagnosis not present

## 2018-04-19 ENCOUNTER — Ambulatory Visit: Payer: HMO | Admitting: Vascular Surgery

## 2018-04-19 ENCOUNTER — Encounter: Payer: Self-pay | Admitting: Vascular Surgery

## 2018-04-19 VITALS — BP 146/66 | HR 73 | Resp 18 | Ht 62.0 in | Wt 225.3 lb

## 2018-04-19 DIAGNOSIS — Z48812 Encounter for surgical aftercare following surgery on the circulatory system: Secondary | ICD-10-CM | POA: Diagnosis not present

## 2018-04-19 DIAGNOSIS — I739 Peripheral vascular disease, unspecified: Secondary | ICD-10-CM | POA: Diagnosis not present

## 2018-04-19 NOTE — Progress Notes (Signed)
Patient name: Alexis [name] MRN: [id number] DOB: 29-Aug-1950 Sex: female  REASON FOR VISIT:   Follow-up of peripheral vascular disease.  HPI:   Alexis [name] is a pleasant 68 y.o. female with a history of bilateral infrainguinal arterial occlusive disease.  In March 2018, Dr. Donzetta Matters performed jetstream atherectomy of the right superficial femoral artery and popliteal artery with drug-coated balloon angioplasty of the right superficial femoral artery and popliteal artery.  Most recently on 05/06/2017, I performed an arteriogram.  On the left side the common femoral and deep femoral arteries were patent.  The superficial femoral artery was patent proximally with some mild to moderate diffuse disease.  There was a 95% stenosis in the distal superficial femoral artery but she also had disease below this and an eccentric plaque in the popliteal artery at the level of the knee which was calcified.  The below-knee popliteal artery was patent with two-vessel runoff via the anterior tibial and peroneal arteries.  The posterior tibial artery was occluded.  On the left side, at that time there was single-vessel runoff via the peroneal artery.  Since I saw her last she had developed a wound on her pannus on the left side and is being treated at the wound care center.  This wound has made progress.  This has improved.  She continues to have bilateral lower extremity claudication.  She denies any rest pain.  She is not a smoker.  Past Medical History:  Diagnosis Date  . Allergy   . Anxiety   . Arthritis   . Asthma   . Atrial fibrillation ([place])   . CHF (congestive heart failure) ([place])   . COPD (chronic obstructive pulmonary disease) ([place])   . Diabetes mellitus without complication ([place])   . Hyperlipidemia   . Hypertension   . Myocardial infarction ([place])    several  . Pacemaker    CRT-P with RV lead and His Bundle lead ( high threshold)   . Peripheral neuropathy   . Pneumonia   . PONV  (postoperative nausea and vomiting)    unsure of exactly what happened at [facility] in 2010 (knee surgery) that led to crital care  . Thyroid disease     Family History  Problem Relation Age of Onset  . Diabetes Mother   . Hyperlipidemia Mother   . Diabetes Father   . Hyperlipidemia Father   . Heart disease Father        before age 82  . Heart attack Father   . Diabetes Brother   . Hyperlipidemia Brother   . Heart attack Brother     SOCIAL HISTORY: Social History   Tobacco Use  . Smoking status: Former Smoker    Last attempt to quit: 03/01/1998    Years since quitting: 20.1  . Smokeless tobacco: Never Used  Substance Use Topics  . 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    Allergies  Allergen Reactions  . Indomethacin Other (See Comments)     Renal Insufficiency  . Pregabalin Other (See Comments)    DIZZINESS   . Sulfa Antibiotics Hives  . Morphine And Related Nausea And Vomiting    Current Outpatient Medications  Medication Sig Dispense Refill  . albuterol (PROVENTIL HFA;VENTOLIN HFA) 108 (90 Base) MCG/ACT inhaler Inhale 2 puffs into the lungs every 6 (six) hours as needed for wheezing or shortness of breath. 1 Inhaler  2  . alendronate (FOSAMAX) 10 MG tablet Take 1 tablet (10 mg total) by mouth daily before breakfast. Take with a full glass of water on an empty stomach. 30 tablet 11  . allopurinol (ZYLOPRIM) 100 MG tablet Take 1 tablet (100 mg total) by mouth 2 (two) times daily. 60 tablet 3  . AMBULATORY NON FORMULARY MEDICATION Motorized scooter.  Diagnosis: Osteoarthritis M19.90 1 each 0  . amitriptyline (ELAVIL) 10 MG tablet Take 10 mg by mouth at bedtime.    . Blood Glucose Monitoring Suppl (GLUCOCOM BLOOD GLUCOSE MONITOR) DEVI Use to check blood sugars 6 times daily E11.65    . DULoxetine (CYMBALTA) 30 MG capsule Take 1 capsule (30 mg total) by mouth at bedtime. 90  capsule 1  . ELIQUIS 5 MG TABS tablet TAKE ONE (1) TABLET BY MOUTH TWO (2) TIMES DAILY 180 tablet 2  . fluticasone (FLONASE) 50 MCG/ACT nasal spray Place 2 sprays into both nostrils at bedtime. (Patient taking differently: Place 2 sprays into both nostrils as needed. )    . fluticasone (FLOVENT HFA) 110 MCG/ACT inhaler Inhale 2 puffs into the lungs 2 (two) times daily as needed (bronchitis).    Marland Kitchen HYDROcodone-homatropine (HYCODAN) 5-1.5 MG/5ML syrup Take 5 mLs by mouth every 8 (eight) hours as needed. 125 mL 0  . Insulin Disposable Pump (V-GO 40) KIT Inject into the skin See admin instructions. Use with Novolog - 6 pumps before each meal    . levocetirizine (XYZAL) 5 MG tablet Take 1 tablet (5 mg total) by mouth every evening. 30 tablet 0  . Levothyroxine Sodium 150 MCG CAPS Take 1 capsule (150 mcg total) by mouth daily before breakfast. 30 capsule 3  . metoprolol succinate (TOPROL-XL) 25 MG 24 hr tablet Take 1 tablet (25 mg total) by mouth daily. 90 tablet 1  . mupirocin ointment (BACTROBAN) 2 % Apply thin film twice daily 22 g 0  . NOVOLOG 100 UNIT/ML injection 100 units per day in VGO unknown bolus    . ondansetron (ZOFRAN ODT) 8 MG disintegrating tablet Take 1 tablet (8 mg total) by mouth every 8 (eight) hours as needed for nausea or vomiting. 20 tablet 0  . Prenatal Vit-Fe Fumarate-FA (MULTIVITAMIN-PRENATAL) 27-0.8 MG TABS tablet Take 1 tablet by mouth daily. Takes for nail and hair growth     . ramipril (ALTACE) 2.5 MG capsule Take 1 capsule (2.5 mg total) by mouth at bedtime. 90 capsule 1  . Semaglutide 0.25 or 0.5 MG/DOSE SOPN Inject 0.39m weekly    . torsemide (DEMADEX) 10 MG tablet Take 1 tablet (10 mg total) by mouth daily. 90 tablet 1  . atorvastatin (LIPITOR) 40 MG tablet Take 1 tablet (40 mg total) by mouth daily at 6 PM. 90 tablet 3  . doxycycline (VIBRA-TABS) 100 MG tablet Take 1 tablet (100 mg total) by mouth 2 (two) times daily. Can give caps or generic (Patient not taking: Reported  on 04/19/2018) 20 tablet 0   Current Facility-Administered Medications  Medication Dose Route Frequency Provider Last Rate Last Dose  . acetaminophen (TYLENOL) solution 650 mg  650 mg Oral Q6H PRN DAngelia Mould MD        REVIEW OF SYSTEMS:  [X]  denotes positive finding, [ ]  denotes negative finding Cardiac  Comments:  Chest pain or chest pressure:    Shortness of breath upon exertion:    Short of breath when lying flat:    Irregular heart rhythm:        Vascular  Pain in calf, thigh, or hip brought on by ambulation:    Pain in feet at night that wakes you up from your sleep:     Blood clot in your veins:    Leg swelling:         Pulmonary    Oxygen at home:    Productive cough:     Wheezing:         Neurologic    Sudden weakness in arms or legs:     Sudden numbness in arms or legs:     Sudden onset of difficulty speaking or slurred speech:    Temporary loss of vision in one eye:     Problems with dizziness:         Gastrointestinal    Blood in stool:     Vomited blood:         Genitourinary    Burning when urinating:     Blood in urine:        Psychiatric    Major depression:         Hematologic    Bleeding problems:    Problems with blood clotting too easily:        Skin    Rashes or ulcers:        Constitutional    Fever or chills:     PHYSICAL EXAM:   Vitals:   04/19/18 1031  BP: (!) 146/66  Pulse: 73  Resp: 18  SpO2: 98%  Weight: 225 lb 5 oz (102.2 kg)  Height: 5' 2"  (1.575 m)    GENERAL: The patient is a well-nourished female, in no acute distress. The vital signs are documented above. CARDIAC: There is a regular rate and rhythm.  VASCULAR: I do not detect carotid bruits. Because of her obesity and large pannus I cannot palpate femoral pulses.  She has monophasic Doppler signals bilaterally in the posterior tibial positions. PULMONARY: There is good air exchange bilaterally without wheezing or rales. ABDOMEN: Soft and non-tender  with normal pitched bowel sounds.  MUSCULOSKELETAL: There are no major deformities or cyanosis. NEUROLOGIC: No focal weakness or paresthesias are detected. SKIN: There are no ulcers or rashes noted. PSYCHIATRIC: The patient has a normal affect.  DATA:    SAPHENOUS VEIN MAP: I reviewed her saphenous vein map from 06/14/2017.  On the right side the vein was marginal in size in the thigh and quite small in the calf.   I reviewed her previous arteriogram from March 2019.  She had multilevel infrainguinal arterial occlusive disease but reasonable perfusion bilaterally.  MEDICAL ISSUES:   BILATERAL INFRAINGUINAL ARTERIAL OCCLUSIVE DISEASE: The patient does have a infrainguinal arterial occlusive disease bilaterally but is not a good candidate for surgery given her obesity and risk for wound healing problems.  In addition she has disease on the left behind the knee which makes endovascular options less favorable.  Currently however symptoms are tolerable.  I have recommended a follow-up study in 3 months and I have ordered ABIs for that time.  I will see her back at that time.  Hopefully her symptoms will continue to improve with contact exercise and nutrition which we have discussed.  She is not a smoker.  If her symptoms progress we would have to consider possibly CT angiogram to see what options we might have for revascularization.  I will see her back in 3 months.  She knows to call sooner if she has problems.  [name] [facility]  Beeper [phone]

## 2018-04-25 DIAGNOSIS — S31104A Unspecified open wound of abdominal wall, left lower quadrant without penetration into peritoneal cavity, initial encounter: Secondary | ICD-10-CM | POA: Diagnosis not present

## 2018-05-04 DIAGNOSIS — L304 Erythema intertrigo: Secondary | ICD-10-CM | POA: Diagnosis not present

## 2018-05-04 DIAGNOSIS — L821 Other seborrheic keratosis: Secondary | ICD-10-CM | POA: Diagnosis not present

## 2018-05-09 ENCOUNTER — Encounter (HOSPITAL_BASED_OUTPATIENT_CLINIC_OR_DEPARTMENT_OTHER): Payer: HMO | Attending: Internal Medicine

## 2018-05-09 DIAGNOSIS — I4891 Unspecified atrial fibrillation: Secondary | ICD-10-CM | POA: Insufficient documentation

## 2018-05-09 DIAGNOSIS — J449 Chronic obstructive pulmonary disease, unspecified: Secondary | ICD-10-CM | POA: Insufficient documentation

## 2018-05-09 DIAGNOSIS — S31104A Unspecified open wound of abdominal wall, left lower quadrant without penetration into peritoneal cavity, initial encounter: Secondary | ICD-10-CM | POA: Insufficient documentation

## 2018-05-09 DIAGNOSIS — I890 Lymphedema, not elsewhere classified: Secondary | ICD-10-CM | POA: Insufficient documentation

## 2018-05-09 DIAGNOSIS — Z950 Presence of cardiac pacemaker: Secondary | ICD-10-CM | POA: Insufficient documentation

## 2018-05-09 DIAGNOSIS — X58XXXA Exposure to other specified factors, initial encounter: Secondary | ICD-10-CM | POA: Insufficient documentation

## 2018-05-09 DIAGNOSIS — I509 Heart failure, unspecified: Secondary | ICD-10-CM | POA: Insufficient documentation

## 2018-05-09 DIAGNOSIS — E1151 Type 2 diabetes mellitus with diabetic peripheral angiopathy without gangrene: Secondary | ICD-10-CM | POA: Insufficient documentation

## 2018-05-09 DIAGNOSIS — Z794 Long term (current) use of insulin: Secondary | ICD-10-CM | POA: Insufficient documentation

## 2018-05-09 DIAGNOSIS — Z7901 Long term (current) use of anticoagulants: Secondary | ICD-10-CM | POA: Insufficient documentation

## 2018-05-09 DIAGNOSIS — E1142 Type 2 diabetes mellitus with diabetic polyneuropathy: Secondary | ICD-10-CM | POA: Insufficient documentation

## 2018-05-09 DIAGNOSIS — I110 Hypertensive heart disease with heart failure: Secondary | ICD-10-CM | POA: Insufficient documentation

## 2018-05-09 DIAGNOSIS — Z89421 Acquired absence of other right toe(s): Secondary | ICD-10-CM | POA: Insufficient documentation

## 2018-05-10 ENCOUNTER — Other Ambulatory Visit: Payer: Self-pay

## 2018-05-10 ENCOUNTER — Encounter: Payer: Self-pay | Admitting: Cardiology

## 2018-05-10 ENCOUNTER — Ambulatory Visit: Payer: HMO | Admitting: Cardiology

## 2018-05-10 ENCOUNTER — Ambulatory Visit (INDEPENDENT_AMBULATORY_CARE_PROVIDER_SITE_OTHER): Payer: HMO | Admitting: Cardiology

## 2018-05-10 VITALS — BP 128/64 | HR 77 | Ht 62.0 in | Wt 225.8 lb

## 2018-05-10 DIAGNOSIS — Z794 Long term (current) use of insulin: Secondary | ICD-10-CM | POA: Diagnosis not present

## 2018-05-10 DIAGNOSIS — I10 Essential (primary) hypertension: Secondary | ICD-10-CM

## 2018-05-10 DIAGNOSIS — I482 Chronic atrial fibrillation, unspecified: Secondary | ICD-10-CM | POA: Diagnosis not present

## 2018-05-10 DIAGNOSIS — E785 Hyperlipidemia, unspecified: Secondary | ICD-10-CM

## 2018-05-10 DIAGNOSIS — E1142 Type 2 diabetes mellitus with diabetic polyneuropathy: Secondary | ICD-10-CM | POA: Diagnosis not present

## 2018-05-10 NOTE — Patient Instructions (Signed)
Medication Instructions:   NO CHANGE  Labwork:  Your physician recommends that you HAVE LAB WORK TODAY  Testing/Procedures:  Your physician has requested that you have an echocardiogram. Echocardiography is a painless test that uses sound waves to create images of your heart. It provides your doctor with information about the size and shape of your heart and how well your heart's chambers and valves are working. This procedure takes approximately one hour. There are no restrictions for this procedure.  AT THE [facility]  Follow-Up:  Your physician wants you to follow-up in: Alexis [name] will receive a reminder letter in the mail two months in advance. If you don't receive a letter, please call our office to schedule the follow-up appointment.   CALL IN July TO SCHEDULE APPOINTMENT IN [place]

## 2018-05-10 NOTE — Progress Notes (Signed)
HPI: Follow-up atrial fibrillation and prior pacemaker.  Previously followed by Dr. Harrell Gave.  Based on records she had an ablation in 2005 and also has a Medtronic CRT-P for tachybradycardia syndrome.  Also with history of cardiac catheterizations that apparently did not reveal obstructive coronary disease but records are not available.  Last echocardiogram September 2017 showed normal LV function, mild left atrial enlargement and mild right ventricular enlargement.  Also with peripheral vascular disease followed by Dr. Scot Dock.  Since last seen she has some dyspnea but no chest pain, palpitations, syncope or pedal edema.  She has significant claudication.  Current Outpatient Medications  Medication Sig Dispense Refill  . albuterol (PROVENTIL HFA;VENTOLIN HFA) 108 (90 Base) MCG/ACT inhaler Inhale 2 puffs into the lungs every 6 (six) hours as needed for wheezing or shortness of breath. 1 Inhaler 2  . allopurinol (ZYLOPRIM) 100 MG tablet Take 1 tablet (100 mg total) by mouth 2 (two) times daily. 60 tablet 3  . AMBULATORY NON FORMULARY MEDICATION Motorized scooter.  Diagnosis: Osteoarthritis M19.90 1 each 0  . atorvastatin (LIPITOR) 40 MG tablet Take 1 tablet (40 mg total) by mouth daily at 6 PM. 90 tablet 3  . Blood Glucose Monitoring Suppl (GLUCOCOM BLOOD GLUCOSE MONITOR) DEVI Use to check blood sugars 6 times daily E11.65    . DULoxetine (CYMBALTA) 30 MG capsule Take 1 capsule (30 mg total) by mouth at bedtime. 90 capsule 1  . ELIQUIS 5 MG TABS tablet TAKE ONE (1) TABLET BY MOUTH TWO (2) TIMES DAILY 180 tablet 2  . fluticasone (FLONASE) 50 MCG/ACT nasal spray Place 2 sprays into both nostrils at bedtime. (Patient taking differently: Place 2 sprays into both nostrils as needed. )    . fluticasone (FLOVENT HFA) 110 MCG/ACT inhaler Inhale 2 puffs into the lungs 2 (two) times daily as needed (bronchitis).    . Insulin Disposable Pump (V-GO 40) KIT Inject into the skin See admin instructions.  Use with Novolog - 6 pumps before each meal    . Levothyroxine Sodium 150 MCG CAPS Take 1 capsule (150 mcg total) by mouth daily before breakfast. 30 capsule 3  . metoprolol succinate (TOPROL-XL) 25 MG 24 hr tablet Take 1 tablet (25 mg total) by mouth daily. 90 tablet 1  . NOVOLOG 100 UNIT/ML injection 100 units per day in VGO unknown bolus    . Prenatal Vit-Fe Fumarate-FA (MULTIVITAMIN-PRENATAL) 27-0.8 MG TABS tablet Take 1 tablet by mouth daily. Takes for nail and hair growth     . ramipril (ALTACE) 2.5 MG capsule Take 1 capsule (2.5 mg total) by mouth at bedtime. 90 capsule 1  . Semaglutide 0.25 or 0.5 MG/DOSE SOPN Inject 0.66m weekly    . torsemide (DEMADEX) 10 MG tablet Take 1 tablet (10 mg total) by mouth daily. 90 tablet 1   Current Facility-Administered Medications  Medication Dose Route Frequency Provider Last Rate Last Dose  . acetaminophen (TYLENOL) solution 650 mg  650 mg Oral Q6H PRN DAngelia Mould MD         Past Medical History:  Diagnosis Date  . Allergy   . Anxiety   . Arthritis   . Asthma   . Atrial fibrillation ([facility]   . CHF (congestive heart failure) ([place]   . COPD (chronic obstructive pulmonary disease) ([place]   . Diabetes mellitus without complication ([place]   . Hyperlipidemia   . Hypertension   . Myocardial infarction ([facility]    several  . Pacemaker  CRT-P with RV lead and His Bundle lead ( high threshold)   . Peripheral neuropathy   . Pneumonia   . PONV (postoperative nausea and vomiting)    unsure of exactly what happened at [facility] in 2010 (knee surgery) that led to crital care  . Thyroid disease     Past Surgical History:  Procedure Laterality Date  . ABDOMINAL AORTOGRAM W/LOWER EXTREMITY N/A 04/29/2016   Procedure: Abdominal Aortogram w/Lower Extremity;  Surgeon: Waynetta Sandy, MD;  Location: [place] [facility];  Service: Cardiovascular;  Laterality: N/A;  . ABDOMINAL AORTOGRAM W/LOWER EXTREMITY N/A 05/06/2017    Procedure: ABDOMINAL AORTOGRAM W/LOWER EXTREMITY;  Surgeon: Angelia Mould, MD;  Location: [place] [facility];  Service: Cardiovascular;  Laterality: N/A;  bilateral  . ABDOMINAL HYSTERECTOMY    . AMPUTATION Right 07/30/2016   Procedure: AMPUTATION RIGHT SECOND TOE;  Surgeon: Angelia Mould, MD;  Location: [place];  Service: Vascular;  Laterality: Right;  . CARDIAC CATHETERIZATION     2005 at [facility]  . PERIPHERAL VASCULAR ATHERECTOMY Right 04/29/2016   Procedure: Peripheral Vascular Atherectomy-Right Popliteal;  Surgeon: Waynetta Sandy, MD;  Location: [place] [facility];  Service: Cardiovascular;  Laterality: Right;  . PERIPHERAL VASCULAR INTERVENTION Right 04/29/2016   Procedure: Peripheral Vascular Intervention-Right Popliteal;  Surgeon: Waynetta Sandy, MD;  Location: [place] [facility];  Service: Cardiovascular;  Laterality: Right;  POPLITEAL PTA  . RADIOFREQUENCY ABLATION    . TOTAL KNEE ARTHROPLASTY    . TUBAL LIGATION      Social History   Socioeconomic History  . Marital status: Married    Spouse name: Not on file  . Number of children: Not on file  . Years of education: Not on file  . Highest education level: Not on file  Occupational History  . Occupation: retired  Scientific laboratory technician  . Financial resource strain: Not on file  . Food insecurity:    Worry: Not on file    Inability: Not on file  . Transportation needs:    Medical: Not on file    Non-medical: Not on file  Tobacco Use  . Smoking status: Former Smoker    Last attempt to quit: 03/01/1998    Years since quitting: 20.2  . Smokeless tobacco: Never Used  Substance and Sexual Activity  . 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  . Drug use: Yes    Types: Marijuana    Comment: remote use  . Sexual activity: Never  Lifestyle  . Physical activity:    Days per week: Not on file    Minutes per  session: Not on file  . Stress: Not on file  Relationships  . Social connections:    Talks on phone: Not on file    Gets together: Not on file    Attends religious service: Not on file    Active member of club or organization: Not on file    Attends meetings of clubs or organizations: Not on file    Relationship status: Not on file  . Intimate partner violence:    Fear of current or ex partner: Not on file    Emotionally abused: Not on file    Physically abused: Not on file    Forced sexual activity: Not on file  Other Topics Concern  . Not on file  Social History Narrative  . Not  on file    Family History  Problem Relation Age of Onset  . Diabetes Mother   . Hyperlipidemia Mother   . Diabetes Father   . Hyperlipidemia Father   . Heart disease Father        before age 36  . Heart attack Father   . Diabetes Brother   . Hyperlipidemia Brother   . Heart attack Brother     ROS: Some pain from skin lesions in pannus but no fevers or chills, productive cough, hemoptysis, dysphasia, odynophagia, melena, hematochezia, dysuria, hematuria, rash, seizure activity, orthopnea, PND, pedal edema. Remaining systems are negative.  Physical Exam: Well-developed obese in no acute distress.  Skin is warm and dry.  HEENT is normal.  Neck is supple.  Chest is clear to auscultation with normal expansion.  Cardiovascular exam is irregular Abdominal exam nontender or distended. No masses palpated. Extremities show no edema. neuro grossly intact  A/P  1 permanent atrial fibrillation-plan to continue beta-blocker for rate control if atrial fibrillation recurs.  Continue apixaban.  Hemoglobin and renal function.  Will repeat echocardiogram.  2 hypertension-patient's blood pressure is controlled.  Continue present medications and follow.  3 hyperlipidemia-continue statin.  Check lipids and liver.  4 prior pacemaker-we will refer to electrophysiology.  5 peripheral vascular  disease-followed by vascular surgery.  Continue statin.  No aspirin given need for anticoagulation.  6 chronic diastolic congestive heart failure-she appears to be euvolemic.  Continue present dose of Demadex.  Check potassium and renal function.  Kirk Ruths, MD

## 2018-05-11 LAB — LIPID PANEL
CHOL/HDL RATIO: 3 ratio (ref 0.0–4.4)
Cholesterol, Total: 93 mg/dL — ABNORMAL LOW (ref 100–199)
HDL: 31 mg/dL — ABNORMAL LOW (ref >39)
LDL Calculated: 33 mg/dL (ref 0–99)
Triglycerides: 147 mg/dL (ref 0–149)
VLDL Cholesterol Cal: 29 mg/dL (ref 5–40)

## 2018-05-11 LAB — COMPREHENSIVE METABOLIC PANEL
ALT: 57 IU/L — ABNORMAL HIGH (ref 0–32)
AST: 62 IU/L — ABNORMAL HIGH (ref 0–40)
Albumin/Globulin Ratio: 1.2 (ref 1.2–2.2)
Albumin: 4 g/dL (ref 3.8–4.8)
Alkaline Phosphatase: 273 IU/L — ABNORMAL HIGH (ref 39–117)
BUN/Creatinine Ratio: 24 (ref 12–28)
BUN: 20 mg/dL (ref 8–27)
Bilirubin Total: 0.8 mg/dL (ref 0.0–1.2)
CO2: 28 mmol/L (ref 20–29)
Calcium: 9.5 mg/dL (ref 8.7–10.3)
Chloride: 96 mmol/L (ref 96–106)
Creatinine, Ser: 0.82 mg/dL (ref 0.57–1.00)
GFR calc Af Amer: 84 mL/min/{1.73_m2} (ref >59)
GFR calc non Af Amer: 73 mL/min/{1.73_m2} (ref >59)
Globulin, Total: 3.3 g/dL (ref 1.5–4.5)
Glucose: 232 mg/dL — ABNORMAL HIGH (ref 65–99)
Potassium: 4.1 mmol/L (ref 3.5–5.2)
Sodium: 140 mmol/L (ref 134–144)
TOTAL PROTEIN: 7.3 g/dL (ref 6.0–8.5)

## 2018-05-11 LAB — CBC
HEMATOCRIT: 36.1 % (ref 34.0–46.6)
Hemoglobin: 12.5 g/dL (ref 11.1–15.9)
MCH: 32.9 pg (ref 26.6–33.0)
MCHC: 34.6 g/dL (ref 31.5–35.7)
MCV: 95 fL (ref 79–97)
Platelets: 50 10*3/uL — CL (ref 150–450)
RBC: 3.8 x10E6/uL (ref 3.77–5.28)
RDW: 13.3 % (ref 11.7–15.4)
WBC: 3.4 10*3/uL (ref 3.4–10.8)

## 2018-05-12 DIAGNOSIS — I890 Lymphedema, not elsewhere classified: Secondary | ICD-10-CM | POA: Diagnosis not present

## 2018-05-12 DIAGNOSIS — Z950 Presence of cardiac pacemaker: Secondary | ICD-10-CM | POA: Diagnosis not present

## 2018-05-12 DIAGNOSIS — Z794 Long term (current) use of insulin: Secondary | ICD-10-CM | POA: Diagnosis not present

## 2018-05-12 DIAGNOSIS — Z7901 Long term (current) use of anticoagulants: Secondary | ICD-10-CM | POA: Diagnosis not present

## 2018-05-12 DIAGNOSIS — I4891 Unspecified atrial fibrillation: Secondary | ICD-10-CM | POA: Diagnosis not present

## 2018-05-12 DIAGNOSIS — E1142 Type 2 diabetes mellitus with diabetic polyneuropathy: Secondary | ICD-10-CM | POA: Diagnosis not present

## 2018-05-12 DIAGNOSIS — Z89421 Acquired absence of other right toe(s): Secondary | ICD-10-CM | POA: Diagnosis not present

## 2018-05-12 DIAGNOSIS — E1151 Type 2 diabetes mellitus with diabetic peripheral angiopathy without gangrene: Secondary | ICD-10-CM | POA: Diagnosis not present

## 2018-05-12 DIAGNOSIS — I110 Hypertensive heart disease with heart failure: Secondary | ICD-10-CM | POA: Diagnosis not present

## 2018-05-12 DIAGNOSIS — S31104A Unspecified open wound of abdominal wall, left lower quadrant without penetration into peritoneal cavity, initial encounter: Secondary | ICD-10-CM | POA: Diagnosis not present

## 2018-05-12 DIAGNOSIS — I509 Heart failure, unspecified: Secondary | ICD-10-CM | POA: Diagnosis not present

## 2018-05-12 DIAGNOSIS — J449 Chronic obstructive pulmonary disease, unspecified: Secondary | ICD-10-CM | POA: Diagnosis not present

## 2018-05-12 DIAGNOSIS — X58XXXA Exposure to other specified factors, initial encounter: Secondary | ICD-10-CM | POA: Diagnosis not present

## 2018-05-15 ENCOUNTER — Other Ambulatory Visit: Payer: Self-pay | Admitting: Medical

## 2018-05-15 DIAGNOSIS — L97519 Non-pressure chronic ulcer of other part of right foot with unspecified severity: Secondary | ICD-10-CM | POA: Diagnosis not present

## 2018-05-16 ENCOUNTER — Telehealth: Payer: Self-pay | Admitting: *Deleted

## 2018-05-16 DIAGNOSIS — E785 Hyperlipidemia, unspecified: Secondary | ICD-10-CM

## 2018-05-16 NOTE — Telephone Encounter (Addendum)
Left message for pt to call   ----- Message from Lelon Perla, MD sent at 05/12/2018 10:56 AM EDT ----- Chronic thrombocytopenia; needs fu with primary care; elevated LFTs also chronic; decrease lipitor to 20 mg daily; lipids and liver 6 weeks Kirk Ruths

## 2018-05-17 ENCOUNTER — Telehealth: Payer: Self-pay | Admitting: Medical

## 2018-05-17 ENCOUNTER — Other Ambulatory Visit: Payer: Self-pay | Admitting: *Deleted

## 2018-05-17 ENCOUNTER — Ambulatory Visit (HOSPITAL_BASED_OUTPATIENT_CLINIC_OR_DEPARTMENT_OTHER): Payer: HMO

## 2018-05-17 DIAGNOSIS — D696 Thrombocytopenia, unspecified: Secondary | ICD-10-CM

## 2018-05-17 DIAGNOSIS — Z794 Long term (current) use of insulin: Secondary | ICD-10-CM

## 2018-05-17 DIAGNOSIS — I5032 Chronic diastolic (congestive) heart failure: Secondary | ICD-10-CM | POA: Insufficient documentation

## 2018-05-17 DIAGNOSIS — E1159 Type 2 diabetes mellitus with other circulatory complications: Secondary | ICD-10-CM

## 2018-05-17 DIAGNOSIS — I4811 Longstanding persistent atrial fibrillation: Secondary | ICD-10-CM

## 2018-05-17 DIAGNOSIS — I10 Essential (primary) hypertension: Secondary | ICD-10-CM

## 2018-05-17 MED ORDER — ATORVASTATIN CALCIUM 20 MG PO TABS: 20.0000 mg | ORAL_TABLET | Freq: Every day | ORAL | 3 refills | Status: DC

## 2018-05-17 NOTE — Telephone Encounter (Signed)
Message to patient via my chart. New script sent to the pharmacy and Lab orders mailed to the pt

## 2018-05-17 NOTE — Telephone Encounter (Signed)
Referral to Dr. Marin Olp placed. NP or new partner good as well as want her to be seen within a week.

## 2018-05-17 NOTE — Addendum Note (Signed)
Addended by: Barrington Ellison on: 05/17/2018 12:30 PM   Modules accepted: Orders

## 2018-05-17 NOTE — Patient Outreach (Addendum)
  [facility]) Care Management Chronic Special Needs Program  05/17/2018  Name: Alexis [name] DOB: 11/04/1950  MRN: [id number]  Alexis [name] is enrolled in a chronic special needs plan for  Diabetes. A completed health risk assessment has not been received from the client and client has not responded to outreach attempts by their health care concierge.  The client's individualized care plan was developed based on available data.  Plan:  . Send unsuccessful outreach letter with a copy of individualized care plan to client . Send Neurosurgeon on self management of heart failure and HTN to client. . Send Advance Directive packet to client. . Refer client to [facility] pharmacy services related to polypharmacy. . Send individualized care plan to provider  Chronic care management coordinator, Thea Silversmith,  will attempt outreach in 2-4 months.   Barrington Ellison RN,CCM,CDE [place] Management Coordinator Office Phone [phone] Office Fax [phone]

## 2018-05-18 ENCOUNTER — Telehealth: Payer: Self-pay | Admitting: Pharmacist

## 2018-05-18 NOTE — Patient Outreach (Signed)
[facility]) Care Management  05/18/2018  Alexis [name] 1950-06-09 [id number]   Patient was called regarding medication review as she is part of the [place] program. Unfortunately, she did not answer her phone. HIPAA compliant message was left on her voicemail.  Plan: Send patient an unsuccessful outreach letter. Call patient back in 10-14 days due to type of referral.  Elayne Guerin, PharmD, [place] Clinical Pharmacist [phone]

## 2018-05-19 ENCOUNTER — Encounter: Payer: Self-pay | Admitting: Cardiology

## 2018-05-22 ENCOUNTER — Telehealth: Payer: Self-pay | Admitting: Family

## 2018-05-22 NOTE — Telephone Encounter (Signed)
sched new patient appt for 06/15/18 at 130 pm appt letter mailed 3/23

## 2018-05-26 ENCOUNTER — Telehealth: Payer: Self-pay | Admitting: *Deleted

## 2018-05-26 NOTE — Telephone Encounter (Signed)
Called patient to let them know due to recent [place] and Health Department Protocols, we are not seeing patients in the office on Monday in [facility]. I let her know due her appointment she will possibly have to send in a Remote transmission from her device so we will have an updated account of the device.  She states she has not been checked or sent a remote check in years. She does not have it plugged up and doesn't know how to use it.  I sent her [employer] Information via MyChart.  Will forward to both Sherri and [employer]. She would like a call from device to set up her Home Remote Checker.

## 2018-05-26 NOTE — Telephone Encounter (Signed)
Pt's device appears to have been most recently be followed by Dr. Minna Merritts. Pt saw Dr. Curt Bears in 05/2016 in [place] to establish care. Do not see scanned in PPM report from that appointment to confirm device information to request remote monitoring transfer.  Will send message to Med[employer]onic rep for further assistance.

## 2018-05-29 ENCOUNTER — Encounter (INDEPENDENT_AMBULATORY_CARE_PROVIDER_SITE_OTHER): Payer: HMO | Admitting: Cardiology

## 2018-05-29 NOTE — Telephone Encounter (Signed)
LMOVM requesting call back to the DC. Received updated patient device info from [employer] rep, enrolled patient in Carelink. Per Carelink, patient was not issued a monitor for her CRT-P, suspect she may have an old monitor from a previous device. Will clarify when patient returns call. If no monitor, will place an electronic request for Carelink tech services to contact patient.

## 2018-05-29 NOTE — Telephone Encounter (Signed)
Patient returning call. She does have a monitor from a previous device. Patient is looking for the home monitor once she finds it she will call back.

## 2018-05-29 NOTE — Telephone Encounter (Signed)
Called patient to help set up WebEx on tablet. LVMTCB WebEx information has been sent [place]

## 2018-05-29 NOTE — Progress Notes (Signed)
This encounter was created in error - please disregard.  This encounter was created in error - please disregard.

## 2018-05-30 ENCOUNTER — Encounter: Payer: Self-pay | Admitting: Medical

## 2018-06-01 NOTE — Telephone Encounter (Signed)
Called patient to help set up WebEx on her Comptroller.

## 2018-06-12 ENCOUNTER — Encounter: Payer: Self-pay | Admitting: Medical

## 2018-06-13 ENCOUNTER — Encounter: Payer: Self-pay | Admitting: Medical

## 2018-06-13 ENCOUNTER — Other Ambulatory Visit: Payer: Self-pay | Admitting: Medical

## 2018-06-13 NOTE — Telephone Encounter (Signed)
LMOM informing Pt to call back to set up virtual visit.

## 2018-06-14 ENCOUNTER — Encounter: Payer: Self-pay | Admitting: Medical

## 2018-06-14 ENCOUNTER — Other Ambulatory Visit: Payer: Self-pay

## 2018-06-14 ENCOUNTER — Ambulatory Visit (INDEPENDENT_AMBULATORY_CARE_PROVIDER_SITE_OTHER): Payer: HMO | Admitting: Medical

## 2018-06-14 DIAGNOSIS — J301 Allergic rhinitis due to pollen: Secondary | ICD-10-CM | POA: Diagnosis not present

## 2018-06-14 DIAGNOSIS — E1169 Type 2 diabetes mellitus with other specified complication: Secondary | ICD-10-CM

## 2018-06-14 DIAGNOSIS — I10 Essential (primary) hypertension: Secondary | ICD-10-CM

## 2018-06-14 DIAGNOSIS — E785 Hyperlipidemia, unspecified: Secondary | ICD-10-CM

## 2018-06-14 DIAGNOSIS — E1059 Type 1 diabetes mellitus with other circulatory complications: Secondary | ICD-10-CM

## 2018-06-14 DIAGNOSIS — T148XXD Other injury of unspecified body region, subsequent encounter: Secondary | ICD-10-CM

## 2018-06-14 MED ORDER — LEVOCETIRIZINE DIHYDROCHLORIDE 5 MG PO TABS: 5.0000 mg | ORAL_TABLET | Freq: Every evening | ORAL | 3 refills | Status: DC

## 2018-06-14 MED ORDER — ROSUVASTATIN CALCIUM 20 MG PO TABS: 20.0000 mg | ORAL_TABLET | Freq: Every day | ORAL | 11 refills | Status: DC

## 2018-06-14 NOTE — Patient Instructions (Addendum)
Recent allergic rhinitis flare. Refilled xyzal as that has helped a lot in past.  Glad to hear that your former lower abd wound has finally healed. You attributed that to atorvastatin after reading insert. Stopped med and area resolved. But still need to be on statin type med.  For high cholesterol will rx generic crestor. Will recheck lipid panel in about 8 weeks.  For htn continue current meds. No bp checked since has no machine. Trouble finding machine at pharmacy. So asked pt to try [facility]. Check bp 3 times a week. Want to confirm close to 130/80.   For diabetes continue on current med regimen. Hopefully endo will have success getting the pod authorized.  Follow up date to be determined pending lifting of pandemic restrictions.

## 2018-06-14 NOTE — Progress Notes (Signed)
Subjective:    Patient ID: Alexis [name], female    DOB: 1950/12/11, 68 y.o.   MRN: [id number]  HPI  Virtual Visit via Video Note  I connected with Alexis [name] on 06/14/18 at  8:40 AM EDT by a video enabled telemedicine application and verified that I am speaking with the correct person using two identifiers.   I discussed the limitations of evaluation and management by telemedicine and the availability of in person appointments. The patient expressed understanding and agreed to proceed.  Pt did not check vitals. She has no machine to check.   History of Present Illness:   Pt scheduled virtual visit. She updates me that her stomach sores lower abdomen after she stopped lipitor. She read on packet insert skin side effect so she stopped. She is convinced.   Pt also wants xyzal refill for nasal congestion, sneezing and itching eyes. Started on Sunday when she went outside without mask and exposed to pollen.  Pt states her sugar have gone down from 399 to 199. Pt states no longer has insulin pump. Pt endocrinologist might replace it with the POD. Pt talked with endocrinologist yesterday and they will look into rx of POD.       Observations/Objective:   Assessment and Plan: Recent allergic rhinitis flare. Refilled xyzal as that has helped a lot in past.  Glad to hear that your former lower abd wound has finally healed. You attributed that to atorvastatin after reading insert. Stopped med and area resolved. But still need to be on statin type med.  For high cholesterol will rx generic crestor. Will recheck lipid panel in about 8 weeks.  For htn continue current meds. No bp checked since has no machine. Trouble finding machine at pharmacy. So asked pt to try [facility]. Check bp 3 times a week. Want to confirm close to 130/80.   For diabetes continue on current med regimen. Hopefully endo will have success getting the pod authorized.  Follow up date to be determined pending  lifting of pandemic restrictions.  Follow Up Instructions:    I discussed the assessment and treatment plan with the patient. The patient was provided an opportunity to ask questions and all were answered. The patient agreed with the plan and demonstrated an understanding of the instructions.   The patient was advised to call back or seek an in-person evaluation if the symptoms worsen or if the condition fails to improve as anticipated.  I provided 25  minutes of non-face-to-face time during this encounter.   Mackie Pai, PA-C   Review of Systems  Constitutional: Negative for chills, fatigue and fever.  HENT: Negative for congestion, drooling, ear discharge, sinus pressure and sinus pain.   Respiratory: Negative for cough, chest tightness, shortness of breath and wheezing.   Cardiovascular: Negative for chest pain and palpitations.  Gastrointestinal: Negative for abdominal pain, blood in stool, constipation, diarrhea, nausea and vomiting.  Genitourinary: Negative for difficulty urinating, dysuria, flank pain and hematuria.  Musculoskeletal: Negative for back pain and gait problem.  Skin: Negative for rash.  Neurological: Negative for dizziness, syncope, weakness, numbness and headaches.  Hematological: Negative for adenopathy. Does not bruise/bleed easily.  Psychiatric/Behavioral: Negative for behavioral problems, confusion and sleep disturbance. The patient is not nervous/anxious.    Past Medical History:  Diagnosis Date  . Allergy   . Anxiety   . Arthritis   . Asthma   . Atrial fibrillation ([place])   . CHF (congestive heart failure) ([place])   .  COPD (chronic obstructive pulmonary disease) ([place])   . Diabetes mellitus without complication ([place])   . Hyperlipidemia   . Hypertension   . Myocardial infarction ([place])    several  . Pacemaker    CRT-P with RV lead and His Bundle lead ( high threshold)   . Peripheral neuropathy   . Pneumonia   . PONV (postoperative nausea and  vomiting)    unsure of exactly what happened at [facility] in 2010 (knee surgery) that led to crital care  . Thyroid disease      Social History   Socioeconomic History  . Marital status: Married    Spouse name: Not on file  . Number of children: Not on file  . Years of education: Not on file  . Highest education level: Not on file  Occupational History  . Occupation: retired  Scientific laboratory technician  . Financial resource strain: Not on file  . Food insecurity:    Worry: Not on file    Inability: Not on file  . Transportation needs:    Medical: Not on file    Non-medical: Not on file  Tobacco Use  . Smoking status: Former Smoker    Last attempt to quit: 03/01/1998    Years since quitting: 20.3  . Smokeless tobacco: Never Used  Substance and Sexual Activity  . 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  . Drug use: Yes    Types: Marijuana    Comment: remote use  . Sexual activity: Never  Lifestyle  . Physical activity:    Days per week: Not on file    Minutes per session: Not on file  . Stress: Not on file  Relationships  . Social connections:    Talks on phone: Not on file    Gets together: Not on file    Attends religious service: Not on file    Active member of club or organization: Not on file    Attends meetings of clubs or organizations: Not on file    Relationship status: Not on file  . Intimate partner violence:    Fear of current or ex partner: Not on file    Emotionally abused: Not on file    Physically abused: Not on file    Forced sexual activity: Not on file  Other Topics Concern  . Not on file  Social History Narrative  . Not on file    Past Surgical History:  Procedure Laterality Date  . ABDOMINAL AORTOGRAM W/LOWER EXTREMITY N/A 04/29/2016   Procedure: Abdominal Aortogram w/Lower Extremity;  Surgeon: Waynetta Sandy, MD;  Location: [place] [facility];  Service:  Cardiovascular;  Laterality: N/A;  . ABDOMINAL AORTOGRAM W/LOWER EXTREMITY N/A 05/06/2017   Procedure: ABDOMINAL AORTOGRAM W/LOWER EXTREMITY;  Surgeon: Angelia Mould, MD;  Location: [place] [facility];  Service: Cardiovascular;  Laterality: N/A;  bilateral  . ABDOMINAL HYSTERECTOMY    . AMPUTATION Right 07/30/2016   Procedure: AMPUTATION RIGHT SECOND TOE;  Surgeon: Angelia Mould, MD;  Location: [place];  Service: Vascular;  Laterality: Right;  . CARDIAC CATHETERIZATION     2005 at [facility]  . PERIPHERAL VASCULAR ATHERECTOMY Right 04/29/2016   Procedure: Peripheral Vascular Atherectomy-Right Popliteal;  Surgeon: Waynetta Sandy, MD;  Location: [place] [facility];  Service: Cardiovascular;  Laterality: Right;  . PERIPHERAL VASCULAR INTERVENTION Right 04/29/2016   Procedure: Peripheral Vascular Intervention-Right  Popliteal;  Surgeon: Waynetta Sandy, MD;  Location: [place] [facility];  Service: Cardiovascular;  Laterality: Right;  POPLITEAL PTA  . RADIOFREQUENCY ABLATION    . TOTAL KNEE ARTHROPLASTY    . TUBAL LIGATION      Family History  Problem Relation Age of Onset  . Diabetes Mother   . Hyperlipidemia Mother   . Diabetes Father   . Hyperlipidemia Father   . Heart disease Father        before age 21  . Heart attack Father   . Diabetes Brother   . Hyperlipidemia Brother   . Heart attack Brother     Allergies  Allergen Reactions  . Indomethacin Other (See Comments)     Renal Insufficiency  . Pregabalin Other (See Comments)    DIZZINESS   . Sulfa Antibiotics Hives  . Morphine And Related Nausea And Vomiting    Current Outpatient Medications on File Prior to Visit  Medication Sig Dispense Refill  . albuterol (PROVENTIL HFA;VENTOLIN HFA) 108 (90 Base) MCG/ACT inhaler Inhale 2 puffs into the lungs every 6 (six) hours as needed for wheezing or shortness of breath. 1 Inhaler 2  . allopurinol (ZYLOPRIM) 100 MG tablet Take 1 tablet (100 mg  total) by mouth 2 (two) times daily. 60 tablet 3  . AMBULATORY NON FORMULARY MEDICATION Motorized scooter.  Diagnosis: Osteoarthritis M19.90 1 each 0  . atorvastatin (LIPITOR) 20 MG tablet Take 1 tablet (20 mg total) by mouth daily at 6 PM. 90 tablet 3  . B-D ULTRAFINE III SHORT PEN 31G X 8 MM MISC     . Blood Glucose Monitoring Suppl (GLUCOCOM BLOOD GLUCOSE MONITOR) DEVI Use to check blood sugars 6 times daily E11.65    . cephALEXin (KEFLEX) 500 MG capsule     . Continuous Blood Gluc Sensor (FREESTYLE LIBRE 14 DAY SENSOR) MISC     . DULoxetine (CYMBALTA) 30 MG capsule TAKE ONE CAPSULE BY MOUTH EVERY NIGHT AT BEDTIME 90 capsule 1  . ELIQUIS 5 MG TABS tablet TAKE ONE (1) TABLET BY MOUTH TWO (2) TIMES DAILY 180 tablet 2  . fluconazole (DIFLUCAN) 150 MG tablet     . fluticasone (FLONASE) 50 MCG/ACT nasal spray Place 2 sprays into both nostrils at bedtime. (Patient taking differently: Place 2 sprays into both nostrils as needed. )    . fluticasone (FLOVENT HFA) 110 MCG/ACT inhaler Inhale 2 puffs into the lungs 2 (two) times daily as needed (bronchitis).    . hydrocortisone 2.5 % cream     . Insulin Disposable Pump (V-GO 40) KIT Inject into the skin See admin instructions. Use with Novolog - 6 pumps before each meal    . Insulin Glargine (LANTUS SOLOSTAR) 100 UNIT/ML Solostar Pen Inject 40 units under skin two times daily. DX E11.42    . Insulin Pen Needle (FIFTY50 PEN NEEDLES) 31G X 8 MM MISC Use as needed for 2 injections daily    . LANTUS SOLOSTAR 100 UNIT/ML Solostar Pen     . Levothyroxine Sodium 150 MCG CAPS Take 1 capsule (150 mcg total) by mouth daily before breakfast. 30 capsule 3  . metoprolol succinate (TOPROL-XL) 25 MG 24 hr tablet TAKE 1 TABLET BY MOUTH DAILY 90 tablet 1  . NOVOLOG 100 UNIT/ML injection 100 units per day in VGO unknown bolus    . Prenatal Vit-Fe Fumarate-FA (MULTIVITAMIN-PRENATAL) 27-0.8 MG TABS tablet Take 1 tablet by mouth daily. Takes for nail and hair growth     .  ramipril (ALTACE)  2.5 MG capsule Take 1 capsule (2.5 mg total) by mouth at bedtime. 90 capsule 1  . Semaglutide 0.25 or 0.5 MG/DOSE SOPN Inject 0.63m weekly    . torsemide (DEMADEX) 10 MG tablet Take 1 tablet (10 mg total) by mouth daily. 90 tablet 1   Current Facility-Administered Medications on File Prior to Visit  Medication Dose Route Frequency Provider Last Rate Last Dose  . acetaminophen (TYLENOL) solution 650 mg  650 mg Oral Q6H PRN DAngelia Mould MD        There were no vitals taken for this visit.      Objective:   Physical Exam  No acute distress      Assessment & Plan:

## 2018-06-15 ENCOUNTER — Other Ambulatory Visit: Payer: Self-pay | Admitting: Family

## 2018-06-15 ENCOUNTER — Inpatient Hospital Stay: Payer: HMO | Attending: Medical

## 2018-06-15 ENCOUNTER — Ambulatory Visit: Payer: HMO | Admitting: Family

## 2018-06-15 ENCOUNTER — Other Ambulatory Visit: Payer: Self-pay | Admitting: Pharmacist

## 2018-06-15 DIAGNOSIS — D696 Thrombocytopenia, unspecified: Secondary | ICD-10-CM

## 2018-06-15 NOTE — Patient Outreach (Signed)
[facility]) Care Management  06/15/2018  Alexis [name] [birth year] [id number]  Patient was called regarding medication review and assistance as she is part of HTA CSNP. Unfortunately, she did not answer the phone. HIPAA compliant message was left on her voicemail.  Plan: Follow up with the patient 14-21 days.   Elayne Guerin, PharmD, [place] Clinical Pharmacist [phone]

## 2018-06-16 ENCOUNTER — Ambulatory Visit: Payer: Self-pay | Admitting: Pharmacist

## 2018-06-21 ENCOUNTER — Telehealth: Payer: Self-pay | Admitting: Cardiology

## 2018-06-21 NOTE — Telephone Encounter (Signed)
Spoke with pt, the person that called the patient was going to make her an appointment with dr Curt Bears. Will forward to sherri dr Curt Bears nurse.

## 2018-06-21 NOTE — Telephone Encounter (Signed)
Pt reports that she does not have a home monitor. MDT number given to pt to call and request home monitoring box. Pt agreeable to calling.  Pt understands I will call at a later date to see if she received box.

## 2018-06-21 NOTE — Telephone Encounter (Signed)
New Message  Patient returning call please give her a call back.

## 2018-06-27 ENCOUNTER — Other Ambulatory Visit: Payer: Self-pay

## 2018-06-27 DIAGNOSIS — I739 Peripheral vascular disease, unspecified: Secondary | ICD-10-CM

## 2018-06-28 ENCOUNTER — Other Ambulatory Visit: Payer: Self-pay | Admitting: Medical

## 2018-07-04 ENCOUNTER — Other Ambulatory Visit: Payer: Self-pay | Admitting: Pharmacist

## 2018-07-04 NOTE — Patient Outreach (Signed)
[name]) Care Management  07/04/2018  Alexis [name] Jul 14, 1950 [id number]   Patient was called regarding medication review and assistance as she is part of HTA CSNP. Unfortunately, she did not answer the phone. HIPAA compliant message was left on her voicemail.  Today's call was the 3rd unsuccessful call. Patient was sent an unsuccessful outreach letter on 05/18/2018.  Plan: Follow up with the patient 30 days.  Elayne Guerin, PharmD, [place] Clinical Pharmacist [phone]

## 2018-07-05 ENCOUNTER — Ambulatory Visit: Payer: Self-pay | Admitting: Pharmacist

## 2018-07-13 ENCOUNTER — Ambulatory Visit: Payer: Self-pay

## 2018-07-19 ENCOUNTER — Encounter (HOSPITAL_COMMUNITY): Payer: HMO

## 2018-07-19 ENCOUNTER — Ambulatory Visit: Payer: HMO | Admitting: Vascular Surgery

## 2018-08-04 ENCOUNTER — Other Ambulatory Visit: Payer: Self-pay | Admitting: Medical

## 2018-08-04 DIAGNOSIS — M109 Gout, unspecified: Secondary | ICD-10-CM

## 2018-08-07 ENCOUNTER — Ambulatory Visit: Payer: Self-pay | Admitting: Pharmacist

## 2018-08-07 ENCOUNTER — Other Ambulatory Visit: Payer: Self-pay | Admitting: Pharmacist

## 2018-08-07 NOTE — Patient Outreach (Signed)
[facility]) Care Management  08/07/2018  EYMI LIPUMA 08/25/1950 [id number]   Patient was called regarding medication review and assistance as she is part of HTA CSNP. Unfortunately, she did not answer the phone. HIPAA compliant message was left on her voicemail.  Today's call was the 4th unsuccessful call. Patient was sent an unsuccessful outreach letter on 05/18/2018.  Plan: Follow up with the patient in 3 months.  Elayne Guerin, PharmD, [place] Clinical Pharmacist [phone]

## 2018-08-22 ENCOUNTER — Encounter: Payer: Self-pay | Admitting: Medical

## 2018-08-22 NOTE — Telephone Encounter (Signed)
Called patient back about her Mychart message. Informed patient that Mychart is not to be used for urgent messages. Informed patient that having trouble breathing is an urgent issue and if she is having trouble breathing to call 911 or go to ED. Patient stated she is not having trouble breathing all the time, just when she lays down at night. Patient stated she has been having some issues with constipation, some swelling and 5 lbs weight gain in over a week. Patient stated she thinks she needs an appointment with Dr. Curt Bears. Patient also stated she has not been able to send a transmission for her device. Informed patient of number to call for device clinic to help with transmission. Since office is closed advised patient to go to ED if symptoms get worse. Informed patient a message would be sent to Dr. Curt Bears and his nurse for further advisement.

## 2018-08-23 NOTE — Telephone Encounter (Signed)
Appt made with Kerin Ransom PA for Friday at 10:15 am 08/25/18

## 2018-08-24 ENCOUNTER — Telehealth: Payer: Self-pay | Admitting: Cardiology

## 2018-08-24 NOTE — Telephone Encounter (Signed)
I left a voice message to confirm appt for 08-25-18 with [facility]. I also asked pt to call back, so we could pre-screen her for COVID-19.

## 2018-08-25 ENCOUNTER — Ambulatory Visit (INDEPENDENT_AMBULATORY_CARE_PROVIDER_SITE_OTHER): Payer: HMO | Admitting: Cardiology

## 2018-08-25 ENCOUNTER — Other Ambulatory Visit: Payer: Self-pay

## 2018-08-25 ENCOUNTER — Encounter: Payer: Self-pay | Admitting: Cardiology

## 2018-08-25 ENCOUNTER — Telehealth: Payer: Self-pay | Admitting: Medical

## 2018-08-25 ENCOUNTER — Telehealth: Payer: Self-pay

## 2018-08-25 VITALS — BP 168/58 | HR 63 | Temp 96.5°F | Wt 232.2 lb

## 2018-08-25 DIAGNOSIS — Z950 Presence of cardiac pacemaker: Secondary | ICD-10-CM | POA: Diagnosis not present

## 2018-08-25 DIAGNOSIS — K5900 Constipation, unspecified: Secondary | ICD-10-CM | POA: Diagnosis not present

## 2018-08-25 DIAGNOSIS — D696 Thrombocytopenia, unspecified: Secondary | ICD-10-CM | POA: Diagnosis not present

## 2018-08-25 DIAGNOSIS — E8770 Fluid overload, unspecified: Secondary | ICD-10-CM | POA: Diagnosis not present

## 2018-08-25 DIAGNOSIS — I4821 Permanent atrial fibrillation: Secondary | ICD-10-CM

## 2018-08-25 DIAGNOSIS — E785 Hyperlipidemia, unspecified: Secondary | ICD-10-CM | POA: Diagnosis not present

## 2018-08-25 DIAGNOSIS — Z794 Long term (current) use of insulin: Secondary | ICD-10-CM

## 2018-08-25 DIAGNOSIS — Z7901 Long term (current) use of anticoagulants: Secondary | ICD-10-CM | POA: Insufficient documentation

## 2018-08-25 DIAGNOSIS — I10 Essential (primary) hypertension: Secondary | ICD-10-CM

## 2018-08-25 DIAGNOSIS — E118 Type 2 diabetes mellitus with unspecified complications: Secondary | ICD-10-CM

## 2018-08-25 DIAGNOSIS — I739 Peripheral vascular disease, unspecified: Secondary | ICD-10-CM

## 2018-08-25 DIAGNOSIS — IMO0001 Reserved for inherently not codable concepts without codable children: Secondary | ICD-10-CM | POA: Insufficient documentation

## 2018-08-25 LAB — BASIC METABOLIC PANEL
BUN/Creatinine Ratio: 55 — ABNORMAL HIGH (ref 12–28)
BUN: 46 mg/dL — ABNORMAL HIGH (ref 8–27)
CO2: 32 mmol/L — ABNORMAL HIGH (ref 20–29)
Calcium: 9.6 mg/dL (ref 8.7–10.3)
Chloride: 101 mmol/L (ref 96–106)
Creatinine, Ser: 0.84 mg/dL (ref 0.57–1.00)
GFR calc Af Amer: 82 mL/min/{1.73_m2} (ref >59)
GFR calc non Af Amer: 71 mL/min/{1.73_m2} (ref >59)
Glucose: 202 mg/dL — ABNORMAL HIGH (ref 65–99)
Potassium: 4.3 mmol/L (ref 3.5–5.2)
Sodium: 136 mmol/L (ref 134–144)

## 2018-08-25 LAB — CBC WITH DIFFERENTIAL/PLATELET
Basophils Absolute: 0 10*3/uL (ref 0.0–0.2)
Basos: 0 %
EOS (ABSOLUTE): 0.1 10*3/uL (ref 0.0–0.4)
Eos: 1 %
Hematocrit: 25.5 % — ABNORMAL LOW (ref 34.0–46.6)
Hemoglobin: 8.4 g/dL — ABNORMAL LOW (ref 11.1–15.9)
Lymphocytes Absolute: 1.1 10*3/uL (ref 0.7–3.1)
Lymphs: 21 %
MCH: 31.7 pg (ref 26.6–33.0)
MCHC: 32.9 g/dL (ref 31.5–35.7)
MCV: 96 fL (ref 79–97)
Monocytes Absolute: 0.4 10*3/uL (ref 0.1–0.9)
Monocytes: 8 %
Neutrophils Absolute: 3.5 10*3/uL (ref 1.4–7.0)
Neutrophils: 70 %
Platelets: 81 10*3/uL — CL (ref 150–450)
RBC: 2.65 x10E6/uL — CL (ref 3.77–5.28)
RDW: 14.7 % (ref 11.7–15.4)
WBC: 5.1 10*3/uL (ref 3.4–10.8)

## 2018-08-25 MED ORDER — PANTOPRAZOLE SODIUM 40 MG PO TBEC: 40.0000 mg | DELAYED_RELEASE_TABLET | Freq: Two times a day (BID) | ORAL | 4 refills | Status: DC

## 2018-08-25 MED ORDER — METAMUCIL SMOOTH TEXTURE 58.6 % PO POWD: 1.0000 | Freq: Every day | ORAL | 12 refills | Status: DC

## 2018-08-25 NOTE — Telephone Encounter (Signed)
Spoke with patient and let her know that Lurena Joiner reviewed her lab work and wants her to stop taking the Eliquis and start the Protonix 47m bid. Also informed her that LLurena Joinerspoke with her PCP EMackie Paiand his office will be in touch with her. She voiced understanding.   Called the pharmacy and told them to discard the rx for Eliquis Pharm. Tech voiced understanding.

## 2018-08-25 NOTE — Patient Instructions (Addendum)
Medication Instructions:  STOP Altace  START Metamucil take 1 spoonful once a day with 8oz of water If you need a refill on your cardiac medications before your next appointment, please call your pharmacy.   Lab work: Your physician recommends that you return for lab work in: [place], CBC, BNP(NON STAT) If you have labs (blood work) drawn today and your tests are completely normal, you will receive your results only by: Marland Kitchen MyChart Message (if you have MyChart) OR . A paper copy in the mail If you have any lab test that is abnormal or we need to change your treatment, we will call you to review the results.  Testing/Procedures: NONE   Follow-Up: At [facility], you and your health needs are our priority.  As part of our continuing mission to provide you with exceptional heart care, we have created designated Provider Care Teams.  These Care Teams include your primary Cardiologist (physician) and Advanced Practice Providers (APPs -  Physician Assistants and Nurse Practitioners) who all work together to provide you with the care you need, when you need it. You will need a follow up appointment in 3 months.  Please call our office 2 months in advance to schedule this appointment.  You may see Dr Kirk Ruths IN THE [place] OFFICE or one of the following Advanced Practice Providers on your designated Care Team:   Kerin Ransom, PA-C Roby Lofts, [place] . Sande Rives, PA-C  Any Other Special Instructions Will Be Listed Below (If Applicable). LIMIT YOUR FLUID INTAKE TO 2 LITERS PER DAY

## 2018-08-25 NOTE — Progress Notes (Signed)
Cardiology Office Note:    Date:  08/25/2018   ID:  SUSAN BLEICH, DOB 1950-09-02, MRN [id number]  PCP:  Mackie Pai, PA-C  Cardiologist:  Dr Stanford Breed [facility])  Electrophysiologist:  Will Meredith Leeds, MD   Referring MD: Mackie Pai, PA-C   Pt c/o constipation, weight gain  History of Present Illness:    Alexis [name] is a 68 y.o. female with a hx of chronic atrial fibrillation, insulin-dependent diabetes, hypertension, vascular disease, thrombocytopenia, and dyslipidemia.  She was previously followed for many years by Dr. Eual Fines in [facility] until her insurance changed.  She then saw Dr. Irish Lack once in 2017, and dr Harrell Gave once in Sept 2019.  She last saw Dr Stanford Breed in [place] March 2020.   The patient reports a history of multiple prior heart catheterizations that have not shown coronary disease.  She had a remote pacemaker that apparently was upgraded to a CRT device in March 2016.  This has been evaluated and she is followed now by Dr. Curt Bears.  She is VVI paced at 50 and felt to be in chronic atrial fibrillation.  She is also been followed by Dr. Doren Custard.  She had critical right lower extremity ischemia in March 2018 and underwent right SFA and popliteal intervention followed by a right great toe amputation in June 2018.  She called the office at the urging of her daughter.  The patient complains of constipation and feels like she is holding fluid in her abdomen.  By our scales her weight is up 7 pounds.  Her main issue has been constipation, she has been straining a lot and has noted some blood in her stools.  She did note that she had a black stool and one that looked like coffee grounds.  She is not had syncope or presyncope.  Her blood pressure in the office is somewhat low with a systolic of 94.  She also admits that she "drinks a lot of water".  She is not had nausea or vomiting.  She does say she shortness of breath but it is hard to tell if this is a  marked increase from her baseline or not, she does have morbid obesity and COPD.  At her last office visit with Dr. Stanford Breed he decreased her statin therapy secondary to an increase in her LFTs.  Her platelet count was 50,000 and a referral was placed to Dr. Marin Olp but it does not appear this was ever done.  I reviewed her records in care everywhere and she has had platelets between 50 and 80,000 going back to 2014.  Past Medical History:  Diagnosis Date  . Allergy   . Anxiety   . Arthritis   . Asthma   . Atrial fibrillation ([place])   . CHF (congestive heart failure) ([place])   . COPD (chronic obstructive pulmonary disease) ([place])   . Diabetes mellitus without complication ([place])   . Hyperlipidemia   . Hypertension   . Myocardial infarction ([place])    several  . Pacemaker    CRT-P with RV lead and His Bundle lead ( high threshold)   . Peripheral neuropathy   . Pneumonia   . PONV (postoperative nausea and vomiting)    unsure of exactly what happened at [facility] in 2010 (knee surgery) that led to crital care  . Thyroid disease     Past Surgical History:  Procedure Laterality Date  . ABDOMINAL AORTOGRAM W/LOWER EXTREMITY N/A 04/29/2016   Procedure: Abdominal Aortogram w/Lower  Extremity;  Surgeon: Waynetta Sandy, MD;  Location: [place] [facility];  Service: Cardiovascular;  Laterality: N/A;  . ABDOMINAL AORTOGRAM W/LOWER EXTREMITY N/A 05/06/2017   Procedure: ABDOMINAL AORTOGRAM W/LOWER EXTREMITY;  Surgeon: Angelia Mould, MD;  Location: [place] [facility];  Service: Cardiovascular;  Laterality: N/A;  bilateral  . ABDOMINAL HYSTERECTOMY    . AMPUTATION Right 07/30/2016   Procedure: AMPUTATION RIGHT SECOND TOE;  Surgeon: Angelia Mould, MD;  Location: [place];  Service: Vascular;  Laterality: Right;  . CARDIAC CATHETERIZATION     2005 at [facility]  . PERIPHERAL VASCULAR ATHERECTOMY Right 04/29/2016   Procedure: Peripheral Vascular Atherectomy-Right  Popliteal;  Surgeon: Waynetta Sandy, MD;  Location: [place] [facility];  Service: Cardiovascular;  Laterality: Right;  . PERIPHERAL VASCULAR INTERVENTION Right 04/29/2016   Procedure: Peripheral Vascular Intervention-Right Popliteal;  Surgeon: Waynetta Sandy, MD;  Location: [place] [facility];  Service: Cardiovascular;  Laterality: Right;  POPLITEAL PTA  . RADIOFREQUENCY ABLATION    . TOTAL KNEE ARTHROPLASTY    . TUBAL LIGATION      Current Medications: Current Meds  Medication Sig  . albuterol (PROVENTIL HFA;VENTOLIN HFA) 108 (90 Base) MCG/ACT inhaler Inhale 2 puffs into the lungs every 6 (six) hours as needed for wheezing or shortness of breath.  . allopurinol (ZYLOPRIM) 100 MG tablet TAKE ONE TABLET BY MOUTH TWICE DAILY  . AMBULATORY NON FORMULARY MEDICATION Motorized scooter.  Diagnosis: Osteoarthritis M19.90  . B-D ULTRAFINE III SHORT PEN 31G X 8 MM MISC   . Blood Glucose Monitoring Suppl (GLUCOCOM BLOOD GLUCOSE MONITOR) DEVI Use to check blood sugars 6 times daily E11.65  . Continuous Blood Gluc Sensor (FREESTYLE LIBRE 14 DAY SENSOR) MISC   . ELIQUIS 5 MG TABS tablet TAKE ONE (1) TABLET BY MOUTH TWO (2) TIMES DAILY  . fluconazole (DIFLUCAN) 150 MG tablet   . fluticasone (FLONASE) 50 MCG/ACT nasal spray Place 2 sprays into both nostrils at bedtime. (Patient taking differently: Place 2 sprays into both nostrils as needed. )  . fluticasone (FLOVENT HFA) 110 MCG/ACT inhaler Inhale 2 puffs into the lungs 2 (two) times daily as needed (bronchitis).  . hydrocortisone 2.5 % cream   . Insulin Disposable Pump (V-GO 40) KIT Inject into the skin See admin instructions. Use with Novolog - 6 pumps before each meal  . Insulin Glargine (LANTUS SOLOSTAR) 100 UNIT/ML Solostar Pen Inject 40 units under skin two times daily. DX E11.42  . Insulin Pen Needle (FIFTY50 PEN NEEDLES) 31G X 8 MM MISC Use as needed for 2 injections daily  . LANTUS SOLOSTAR 100 UNIT/ML Solostar Pen   .  levocetirizine (XYZAL) 5 MG tablet Take 1 tablet (5 mg total) by mouth every evening.  . Levothyroxine Sodium 150 MCG CAPS Take 1 capsule (150 mcg total) by mouth daily before breakfast.  . metoprolol succinate (TOPROL-XL) 25 MG 24 hr tablet TAKE 1 TABLET BY MOUTH DAILY  . NOVOLOG 100 UNIT/ML injection 100 units per day in VGO unknown bolus  . Prenatal Vit-Fe Fumarate-FA (MULTIVITAMIN-PRENATAL) 27-0.8 MG TABS tablet Take 1 tablet by mouth daily. Takes for nail and hair growth   . rosuvastatin (CRESTOR) 20 MG tablet Take 1 tablet (20 mg total) by mouth daily.  . Semaglutide 0.25 or 0.5 MG/DOSE SOPN Inject 0.65m weekly  . torsemide (DEMADEX) 10 MG tablet Take 1 tablet (10 mg total) by mouth daily.  . [DISCONTINUED] ramipril (ALTACE) 2.5 MG capsule Take 1 capsule (2.5 mg total) by  mouth at bedtime.   Current Facility-Administered Medications for the 08/25/18 encounter (Office Visit) with Erlene Quan, PA-C  Medication  . acetaminophen (TYLENOL) solution 650 mg     Allergies:   Indomethacin, Pregabalin, Sulfa antibiotics, and Morphine and related   Social History   Socioeconomic History  . Marital status: Married    Spouse name: Not on file  . Number of children: Not on file  . Years of education: Not on file  . Highest education level: Not on file  Occupational History  . Occupation: retired  Scientific laboratory technician  . Financial resource strain: Not on file  . Food insecurity    Worry: Not on file    Inability: Not on file  . Transportation needs    Medical: Not on file    Non-medical: Not on file  Tobacco Use  . Smoking status: Former Smoker    Quit date: 03/01/1998    Years since quitting: 20.4  . Smokeless tobacco: Never Used  Substance and Sexual Activity  . 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  . Drug use: Yes    Types: Marijuana    Comment: remote use  . Sexual activity: Never  Lifestyle  .  Physical activity    Days per week: Not on file    Minutes per session: Not on file  . Stress: Not on file  Relationships  . Social Herbalist on phone: Not on file    Gets together: Not on file    Attends religious service: Not on file    Active member of club or organization: Not on file    Attends meetings of clubs or organizations: Not on file    Relationship status: Not on file  Other Topics Concern  . Not on file  Social History Narrative  . Not on file     Family History: The patient's family history includes Diabetes in her brother, father, and mother; Heart attack in her brother and father; Heart disease in her father; Hyperlipidemia in her brother, father, and mother.  ROS:   Please see the history of present illness.     All other systems reviewed and are negative.  EKGs/Labs/Other Studies Reviewed:    The following studies were reviewed today:   EKG:  EKG is ordered today.  The ekg ordered today demonstrates AF with VR 77, PVCs, NSST changes  Recent Labs: 05/10/2018: ALT 57; BUN 20; Creatinine, Ser 0.82; Hemoglobin 12.5; Platelets 50; Potassium 4.1; Sodium 140  Recent Lipid Panel    Component Value Date/Time   CHOL 93 (L) 05/10/2018 0944   TRIG 147 05/10/2018 0944   HDL 31 (L) 05/10/2018 0944   CHOLHDL 3.0 05/10/2018 0944   CHOLHDL 6 04/10/2015 1040   VLDL 50.8 (H) 04/10/2015 1040   LDLCALC 33 05/10/2018 0944   LDLDIRECT 111.0 04/10/2015 1040    Physical Exam:    VS:  BP (!) 168/58   Pulse 63   Temp (!) 96.5 F (35.8 C)   Wt 232 lb 3.2 oz (105.3 kg)   SpO2 95%   BMI 42.47 kg/m     Wt Readings from Last 3 Encounters:  08/25/18 232 lb 3.2 oz (105.3 kg)  05/10/18 225 lb 12.8 oz (102.4 kg)  04/19/18 225 lb 5 oz (102.2 kg)     GEN: Morbidly obese Caucasian female in no acute distress HEENT: Normal NECK:  No JVD; No carotid bruits LYMPHATICS: No lymphadenopathy CARDIAC: irregularly irregular, soft systolic murmur AOV rubs,  gallops RESPIRATORY:  Clear to auscultation without rales, wheezing or rhonchi  ABDOMEN: obese, Soft, non-tender, non-distended MUSCULOSKELETAL:  No edema; Chronic skin changes secondary to PVD.   SKIN: Warm and dry NEUROLOGIC:  Alert and oriented x 3 PSYCHIATRIC:  Normal affect   ASSESSMENT:    Fluid overload Pt is in the office today with complaints of increased weight and abdominal fluid.    Constipation Pt c/o constipation with blood in her stool and "dark" stools  HTN (hypertension) Slightly hypotensive today- check STAT CBC and BMP  Permanent atrial fibrillation Rate controlled  Pacemaker Upgraded to MDT CRT March 2016 VVI paced at 50  Chronic anticoagulation CHADS VASC-4, she is on Eliquis  Thrombocytopenia ([place]) Chronic going back to at least 2014.  I see where a referral  to Dr Jonette Eva was placed but it appears an appointment was never made.   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   Peripheral vascular disease ([place]) Rt SFA and popliteal PTA March 2018 Rt great toe amputation June 2018  Morbid obesity ([place]) BMI 42  PLAN:    I ordered STAT CBC- I'm concerned about her history of dark stools on Eliquis with a history of chronically low Plts.  If she has had a significant drop in her Hgb I think she'll need to go to the hospital for GI evaluation.  If she has a moderate drop I'll refer her to her PCP for GI work up and discuss Eliquis with her primary cardiologist.  I also ordered a STAT BMP and BNP- it impossible to evaluate her for volume overload by exam.  Her LE typically "don't swell" and she is morbidly obese. Her B/P is on the low side and I held her her Altace but continued her Demadex.  I also suggested she back off on her H2o intake.    Medication Adjustments/Labs and Tests Ordered: Current medicines are reviewed at length with the patient today.   Concerns regarding medicines are outlined above.  Orders Placed This Encounter  Procedures  . Basic Metabolic Panel (BMET)  . CBC with Differential  . Pro b natriuretic peptide (BNP)[facility])  . EKG 12-Lead   Meds ordered this encounter  Medications  . psyllium (METAMUCIL SMOOTH TEXTURE) 58.6 % powder    Sig: Take 1 packet by mouth daily.    Dispense:  283 g    Refill:  12    Patient Instructions  Medication Instructions:  STOP Altace  START Metamucil take 1 spoonful once a day with 8oz of water If you need a refill on your cardiac medications before your next appointment, please call your pharmacy.   Lab work: Your physician recommends that you return for lab work in: [place], CBC, BNP(NON STAT) If you have labs (blood work) drawn today and your tests are completely normal, you will receive your results only by: Marland Kitchen MyChart Message (if you have MyChart) OR . A paper copy in the mail If you have any lab test that is abnormal or we need to change your treatment, we will call you to review the results.  Testing/Procedures: NONE   Follow-Up: At [facility], you and your health needs are our priority.  As part of our continuing mission to provide you with exceptional heart care, we have created designated Provider Care Teams.  These Care Teams include your primary Cardiologist (physician) and Advanced Practice Providers (APPs -  Physician Assistants and Nurse Practitioners) who all work together to provide you with the care you need, when you need it. You will need a follow up appointment in 3 months.  Please call our office 2 months in advance to schedule this appointment.  You may see Dr Kirk Ruths IN THE [place] OFFICE or one of the following Advanced Practice Providers on your designated Care Team:   Kerin Ransom, PA-C Roby Lofts, [place] . Sande Rives, PA-C  Any Other Special Instructions Will Be Listed Below (If  Applicable). LIMIT YOUR FLUID INTAKE TO 2 LITERS PER DAY    Signed, Kerin Ransom, [place]  08/25/2018 10:46 AM    [place]

## 2018-08-25 NOTE — Assessment & Plan Note (Signed)
Rate controlled 

## 2018-08-25 NOTE — Assessment & Plan Note (Signed)
I suspect she has diabetic gastropathy, she has never seen a gastroenterologist.

## 2018-08-25 NOTE — Assessment & Plan Note (Signed)
CHADS VASC-4, she is on Eliquis

## 2018-08-25 NOTE — Assessment & Plan Note (Signed)
Pt is in the office today with complaints of increased weight and abdominal fluid.

## 2018-08-25 NOTE — Assessment & Plan Note (Signed)
Rt SFA and popliteal PTA March 2018 Rt great toe amputation June 2018

## 2018-08-25 NOTE — Assessment & Plan Note (Addendum)
Chronic going back to at least 2014.  I see where a referral  to Dr Jonette Eva was placed but it appears an appointment was never made.

## 2018-08-25 NOTE — Assessment & Plan Note (Signed)
Pt c/o constipation with blood in her stool and "dark" stools

## 2018-08-25 NOTE — Assessment & Plan Note (Signed)
Upgraded to MDT CRT March 2016 VVI paced at 50

## 2018-08-25 NOTE — Assessment & Plan Note (Signed)
Slightly hypotensive today- check STAT CBC and BMP

## 2018-08-25 NOTE — Assessment & Plan Note (Signed)
BMI 42 

## 2018-08-25 NOTE — Telephone Encounter (Signed)
Please get pt scheuled late next week with me. I think virtual or phone visit would be ok. Follow up after cariologist visit.

## 2018-08-26 ENCOUNTER — Encounter: Payer: Self-pay | Admitting: Medical

## 2018-08-26 LAB — PRO B NATRIURETIC PEPTIDE: NT-Pro BNP: 293 pg/mL (ref 0–301)

## 2018-08-27 ENCOUNTER — Encounter: Payer: Self-pay | Admitting: Medical

## 2018-08-28 ENCOUNTER — Telehealth: Payer: Self-pay | Admitting: Cardiology

## 2018-08-28 ENCOUNTER — Other Ambulatory Visit: Payer: Self-pay

## 2018-08-28 ENCOUNTER — Ambulatory Visit (INDEPENDENT_AMBULATORY_CARE_PROVIDER_SITE_OTHER): Payer: HMO | Admitting: Medical

## 2018-08-28 VITALS — BP 132/80 | HR 59 | Wt 222.0 lb

## 2018-08-28 DIAGNOSIS — K5900 Constipation, unspecified: Secondary | ICD-10-CM

## 2018-08-28 DIAGNOSIS — E1059 Type 1 diabetes mellitus with other circulatory complications: Secondary | ICD-10-CM | POA: Diagnosis not present

## 2018-08-28 DIAGNOSIS — D649 Anemia, unspecified: Secondary | ICD-10-CM

## 2018-08-28 DIAGNOSIS — D696 Thrombocytopenia, unspecified: Secondary | ICD-10-CM | POA: Diagnosis not present

## 2018-08-28 DIAGNOSIS — K921 Melena: Secondary | ICD-10-CM | POA: Diagnosis not present

## 2018-08-28 NOTE — Patient Instructions (Addendum)
For constipation, I placed abd xray to get done tomorrow in order to evaluate if obstructive findings. Can go ahead and take anther 1/2 bottle of mag citrate to see if this allows bm but still get xray tomorrow even if you have bm.  For anemia with black stools, Will get cbc, and iron studies tomorrow stat. If drop in hb/hct further then would advise ED evaluation. But if any severe signs or symptoms change or if unable to get studies done at [facility] then adivse to to [facility] ED. Also agree with stopping eliquis in event current GI bleed. Want to evaluate stool cards and will go ahead and refer to GI for evaluation.  For diabetes, follow up with endocrine this week.  For low platelets, Will again make referral to hematologist as did in March.   For hx of atrial fibrillation, will get cmp and advise follow recommendation advised by cardiologist.

## 2018-08-28 NOTE — Progress Notes (Signed)
° °Subjective:  ° ° Patient ID: Alexis [name], female    DOB: 01/31/1951, 67 y.o.   MRN: [id number] ° °HPI ° °Virtual Visit via Telephone Note ° °I connected with Alexis [name] on 08/28/18 at  2:40 PM EDT by telephone and verified that I am speaking with the correct person using two identifiers. ° °Location: °Patient: home °Provider: office. °  °I discussed the limitations, risks, security and privacy concerns of performing an evaluation and management service by telephone and the availability of in person appointments. I also discussed with the patient that there may be a patient responsible charge related to this service. The patient expressed understanding and agreed to proceed. ° ° °History of Present Illness: ° °Pt states she has been having some constipation. She states her stools are looking like black coffee grounds. She states small pellet sized stools most of the time with straining to have bm's. Pt states she had constipation on and off for about 4 weeks. Pt states she can't remember when her last normal bm was. Most of movements have been small pellets. She tried magnesium citrate half bottle today but no bm yet. Last week when she used it she states large bm after bottle of magnesium citrate. ° °Last week she felt bloated. Last week she weighed up to 235 lb. She states she has been taking diuretic and she states her weight this am was 222 lb. Bnp last week ws normal.Saw cardiologist last week. ° °Pt has low platelets. I have referred pt to hematologist early March and she did not attend that appointment. Pt does have anemia. Pt cardiologist stopped eliquis after seeing drop in her hb/hct. ° °Pt started on protonix when labs came back showing drop in hb and hct. ° °Pt is diabetic and has appointment with endocrinologist Thursday morning at 10:30. ° ° °  °Observations/Objective: °General- non acute distress. Pleasant. Oriented. Speaks normal pattern/unlabore. ° ° °Assessment and Plan: °For  constipation, I placed  abd xray to get done tomorrow in order to evaluate if obstructive findings. Can go ahead and take anther 1/2 bottle of mag citrate to see if this allows bm but still get xray tomorrow even if you have bm. ° °For anemia with black stools, Will get cbc, and iron studies tomorrow stat. If drop in hb/hct further then would advise ED evaluation. But if any severe signs or symptoms change or if unable to get studies done at [facility] then adivse to to [facility]. Also agree with stopping eliquis in event current GI bleed. Want to evaluate stool cards and will go ahead and refer to GI for evaluation. ° °For diabetes, follow up with endocrine this week. ° °For low platelets, Will again make referral to hematologist as did in March. ° ° °For hx of atrial fibrillation, will get cmp and advise follow recommendation advised by cardiologist.  ° °40 minutes spent with pt. 50% of time spent counseling pt on plan going forward. ° °Follow Up Instructions: ° °  °I discussed the assessment and treatment plan with the patient. The patient was provided an opportunity to ask questions and all were answered. The patient agreed with the plan and demonstrated an understanding of the instructions. °  °The patient was advised to call back or seek an in-person evaluation if the symptoms worsen or if the condition fails to improve as anticipated. ° °I provided 40 minutes of non-face-to-face time during this encounter. ° ° °Edward Saguier, PA-C ° ° ° °Review   Review of Systems  Constitutional: Positive for fatigue. Negative for chills and diaphoresis.  HENT: Negative for congestion.   Respiratory: Negative for cough, chest tightness, shortness of breath and wheezing.   Cardiovascular: Negative for chest pain and palpitations.  Gastrointestinal: Positive for abdominal distention, blood in stool and constipation. Negative for abdominal pain, diarrhea, nausea and vomiting.  Genitourinary: Negative for difficulty urinating,  dysuria, flank pain, frequency and urgency.  Musculoskeletal: Negative for back pain and myalgias.  Skin: Negative for rash.  Neurological: Negative for dizziness, speech difficulty, weakness and light-headedness.  Hematological: Negative for adenopathy. Does not bruise/bleed easily.  Psychiatric/Behavioral: Negative for behavioral problems, confusion and sleep disturbance. The patient is not nervous/anxious.     Past Medical History:  Diagnosis Date   Allergy    Anxiety    Arthritis    Asthma    Atrial fibrillation (HCC)    CHF (congestive heart failure) (HCC)    COPD (chronic obstructive pulmonary disease) (HCC)    Diabetes mellitus without complication (HCC)    Hyperlipidemia    Hypertension    Myocardial infarction ([place])    several   Pacemaker    CRT-P with RV lead and His Bundle lead ( high threshold)    Peripheral neuropathy    Pneumonia    PONV (postoperative nausea and vomiting)    unsure of exactly what happened at [facility] in 2010 (knee surgery) that led to crital care   Thyroid disease      Social History   Socioeconomic History   Marital status: Married    Spouse name: Not on file   Number of children: Not on file   Years of education: Not on file   Highest education level: Not on file  Occupational History   Occupation: retired  Scientist, product/process development strain: Not on file   Food insecurity    Worry: Not on file    Inability: Not on Lexicographer needs    Medical: Not on file    Non-medical: Not on file  Tobacco Use   Smoking status: Former Smoker    Quit date: 03/01/1998    Years since quitting: 20.5   Smokeless tobacco: Never Used  Substance and Sexual Activity   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   Drug use: Yes    Types: Marijuana    Comment: remote use   Sexual activity: Never  Lifestyle   Physical  activity    Days per week: Not on file    Minutes per session: Not on file   Stress: Not on file  Relationships   Social connections    Talks on phone: Not on file    Gets together: Not on file    Attends religious service: Not on file    Active member of club or organization: Not on file    Attends meetings of clubs or organizations: Not on file    Relationship status: Not on file   Intimate partner violence    Fear of current or ex partner: Not on file    Emotionally abused: Not on file    Physically abused: Not on file    Forced sexual activity: Not on file  Other Topics Concern   Not on file  Social History Narrative   Not on file    Past Surgical History:  Procedure Laterality Date  ABDOMINAL AORTOGRAM W/LOWER EXTREMITY N/A 04/29/2016   Procedure: Abdominal Aortogram w/Lower Extremity;  Surgeon: Waynetta Sandy, MD;  Location: [facility];  Service: Cardiovascular;  Laterality: N/A;   ABDOMINAL AORTOGRAM W/LOWER EXTREMITY N/A 05/06/2017   Procedure: ABDOMINAL AORTOGRAM W/LOWER EXTREMITY;  Surgeon: Angelia Mould, MD;  Location: [place] [facility] [facility];  Service: Cardiovascular;  Laterality: N/A;  bilateral   ABDOMINAL HYSTERECTOMY     AMPUTATION Right 07/30/2016   Procedure: AMPUTATION RIGHT SECOND TOE;  Surgeon: Angelia Mould, MD;  Location[facility];  Service: Vascular;  Laterality: Right;   CARDIAC CATHETERIZATION     2005 at [place] Right 04/29/2016   Procedure: Peripheral Vascular Atherectomy-Right Popliteal;  Surgeon: Waynetta Sandy, MD;  Location: [place] [facility];  Service: Cardiovascular;  Laterality: Right;   PERIPHERAL VASCULAR INTERVENTION Right 04/29/2016   Procedure: Peripheral Vascular Intervention-Right Popliteal;  Surgeon: Waynetta Sandy, MD;  Location: [place] [facility];  Service: Cardiovascular;  Laterality: Right;  POPLITEAL PTA   RADIOFREQUENCY ABLATION       TOTAL KNEE ARTHROPLASTY     TUBAL LIGATION      Family History  Problem Relation Age of Onset   Diabetes Mother    Hyperlipidemia Mother    Diabetes Father    Hyperlipidemia Father    Heart disease Father        before age 67   Heart attack Father    Diabetes Brother    Hyperlipidemia Brother    Heart attack Brother     Allergies  Allergen Reactions   Indomethacin Other (See Comments)     Renal Insufficiency   Pregabalin Other (See Comments)    DIZZINESS    Sulfa Antibiotics Hives   Morphine And Related Nausea And Vomiting    Current Outpatient Medications on File Prior to Visit  Medication Sig Dispense Refill   albuterol (PROVENTIL HFA;VENTOLIN HFA) 108 (90 Base) MCG/ACT inhaler Inhale 2 puffs into the lungs every 6 (six) hours as needed for wheezing or shortness of breath. 1 Inhaler 2   allopurinol (ZYLOPRIM) 100 MG tablet TAKE ONE TABLET BY MOUTH TWICE DAILY 60 tablet 3   AMBULATORY NON FORMULARY MEDICATION Motorized scooter.  Diagnosis: Osteoarthritis M19.90 1 each 0   atorvastatin (LIPITOR) 20 MG tablet Take 1 tablet (20 mg total) by mouth daily at 6 PM. (Patient not taking: Reported on 08/25/2018) 90 tablet 3   B-D ULTRAFINE III SHORT PEN 31G X 8 MM MISC      Blood Glucose Monitoring Suppl (GLUCOCOM BLOOD GLUCOSE MONITOR) DEVI Use to check blood sugars 6 times daily E11.65     cephALEXin (KEFLEX) 500 MG capsule      Continuous Blood Gluc Sensor (FREESTYLE LIBRE 14 DAY SENSOR) MISC      DULoxetine (CYMBALTA) 30 MG capsule TAKE ONE CAPSULE BY MOUTH EVERY NIGHT AT BEDTIME (Patient not taking: Reported on 08/25/2018) 90 capsule 1   fluconazole (DIFLUCAN) 150 MG tablet      fluticasone (FLONASE) 50 MCG/ACT nasal spray Place 2 sprays into both nostrils at bedtime. (Patient taking differently: Place 2 sprays into both nostrils as needed. )     fluticasone (FLOVENT HFA) 110 MCG/ACT inhaler Inhale 2 puffs into the lungs 2 (two) times daily as needed  (bronchitis).     hydrocortisone 2.5 % cream      Insulin Disposable Pump (V-GO 40) KIT Inject into the skin See admin instructions. Use with Novolog - 6  before each meal    °• Insulin Glargine (LANTUS SOLOSTAR) 100 UNIT/ML Solostar Pen Inject 40 units under skin two times daily. DX E11.42    °• Insulin Pen Needle (FIFTY50 PEN NEEDLES) 31G X 8 MM MISC Use as needed for 2 injections daily    °• LANTUS SOLOSTAR 100 UNIT/ML Solostar Pen     °• levocetirizine (XYZAL) 5 MG tablet Take 1 tablet (5 mg total) by mouth every evening. 30 tablet 3  °• Levothyroxine Sodium 150 MCG CAPS Take 1 capsule (150 mcg total) by mouth daily before breakfast. 30 capsule 3  °• metoprolol succinate (TOPROL-XL) 25 MG 24 hr tablet TAKE 1 TABLET BY MOUTH DAILY 90 tablet 1  °• NOVOLOG 100 UNIT/ML injection 100 units per day in VGO unknown bolus    °• pantoprazole (PROTONIX) 40 MG tablet Take 1 tablet (40 mg total) by mouth 2 (two) times daily. 60 tablet 4  °• Prenatal Vit-Fe Fumarate-FA (MULTIVITAMIN-PRENATAL) 27-0.8 MG TABS tablet Take 1 tablet by mouth daily. Takes for nail and hair growth     °• psyllium (METAMUCIL SMOOTH TEXTURE) 58.6 % powder Take 1 packet by mouth daily. 283 g 12  °• rosuvastatin (CRESTOR) 20 MG tablet Take 1 tablet (20 mg total) by mouth daily. 30 tablet 11  °• Semaglutide 0.25 or 0.5 MG/DOSE SOPN Inject 0.5mg weekly    °• torsemide (DEMADEX) 10 MG tablet Take 1 tablet (10 mg total) by mouth daily. 90 tablet 1  ° °Current Facility-Administered Medications on File Prior to Visit  °Medication Dose Route Frequency Provider Last Rate Last Dose  °• acetaminophen (TYLENOL) solution 650 mg  650 mg Oral Q6H PRN Dickson, Christopher S, MD      ° ° °BP 132/80    Pulse (!) 59    Wt 222 lb (100.7 kg)    BMI 40.60 kg/m²  ° ° °   °Objective:  ° Physical Exam ° ° ° ° °   °Assessment & Plan:  ° °

## 2018-08-28 NOTE — Telephone Encounter (Signed)
Left pt a message to call back. 

## 2018-08-28 NOTE — Telephone Encounter (Signed)
I called to check on the patient.  I left a message on her machine, it appears she had an OV with her PCP this afternoon and may still be there.  Kerin Ransom PA-C 08/28/2018 4:51 PM

## 2018-08-29 ENCOUNTER — Other Ambulatory Visit (INDEPENDENT_AMBULATORY_CARE_PROVIDER_SITE_OTHER): Payer: HMO

## 2018-08-29 ENCOUNTER — Other Ambulatory Visit: Payer: Self-pay

## 2018-08-29 ENCOUNTER — Encounter: Payer: Self-pay | Admitting: Medical

## 2018-08-29 ENCOUNTER — Ambulatory Visit (HOSPITAL_BASED_OUTPATIENT_CLINIC_OR_DEPARTMENT_OTHER)
Admission: RE | Admit: 2018-08-29 | Discharge: 2018-08-29 | Disposition: A | Discharge status: Home / Self Care | Payer: HMO | Source: Ambulatory Visit | Attending: Medical | Admitting: Medical

## 2018-08-29 DIAGNOSIS — E1059 Type 1 diabetes mellitus with other circulatory complications: Secondary | ICD-10-CM

## 2018-08-29 DIAGNOSIS — K5900 Constipation, unspecified: Secondary | ICD-10-CM | POA: Diagnosis not present

## 2018-08-29 DIAGNOSIS — D649 Anemia, unspecified: Secondary | ICD-10-CM | POA: Diagnosis not present

## 2018-08-29 LAB — COMPREHENSIVE METABOLIC PANEL
ALT: 26 U/L (ref 0–35)
AST: 48 U/L — ABNORMAL HIGH (ref 0–37)
Albumin: 3.7 g/dL (ref 3.5–5.2)
Alkaline Phosphatase: 123 U/L — ABNORMAL HIGH (ref 39–117)
BUN: 21 mg/dL (ref 6–23)
CO2: 32 mEq/L (ref 19–32)
Calcium: 8.5 mg/dL (ref 8.4–10.5)
Chloride: 98 mEq/L (ref 96–112)
Creatinine, Ser: 0.94 mg/dL (ref 0.40–1.20)
GFR: 58.95 mL/min — ABNORMAL LOW (ref >60.00)
Glucose, Bld: 151 mg/dL — ABNORMAL HIGH (ref 70–99)
Potassium: 4.1 mEq/L (ref 3.5–5.1)
Sodium: 135 mEq/L (ref 135–145)
Total Bilirubin: 0.7 mg/dL (ref 0.2–1.2)
Total Protein: 7 g/dL (ref 6.0–8.3)

## 2018-08-29 LAB — CBC WITH DIFFERENTIAL/PLATELET
Basophils Absolute: 0 10*3/uL (ref 0.0–0.1)
Basophils Relative: 0.7 % (ref 0.0–3.0)
Eosinophils Absolute: 0 10*3/uL (ref 0.0–0.7)
Eosinophils Relative: 1 % (ref 0.0–5.0)
HCT: 25.4 % — ABNORMAL LOW (ref 36.0–46.0)
Hemoglobin: 8.4 g/dL — ABNORMAL LOW (ref 12.0–15.0)
Lymphocytes Relative: 20.5 % (ref 12.0–46.0)
Lymphs Abs: 0.9 10*3/uL (ref 0.7–4.0)
MCHC: 33.2 g/dL (ref 30.0–36.0)
MCV: 101.5 fl — ABNORMAL HIGH (ref 78.0–100.0)
Monocytes Absolute: 0.5 10*3/uL (ref 0.1–1.0)
Monocytes Relative: 10.5 % (ref 3.0–12.0)
Neutro Abs: 2.9 10*3/uL (ref 1.4–7.7)
Neutrophils Relative %: 67.3 % (ref 43.0–77.0)
Platelets: 76 10*3/uL — ABNORMAL LOW (ref 150.0–400.0)
RBC: 2.51 Mil/uL — ABNORMAL LOW (ref 3.87–5.11)
RDW: 17.6 % — ABNORMAL HIGH (ref 11.5–15.5)
WBC: 4.3 10*3/uL (ref 4.0–10.5)

## 2018-08-30 ENCOUNTER — Encounter: Payer: Self-pay | Admitting: Medical

## 2018-08-30 LAB — IRON, TOTAL/TOTAL IRON BINDING CAP
%SAT: 31 % (calc) (ref 16–45)
Iron: 136 ug/dL (ref 45–160)
TIBC: 440 mcg/dL (calc) (ref 250–450)

## 2018-08-31 ENCOUNTER — Emergency Department (HOSPITAL_BASED_OUTPATIENT_CLINIC_OR_DEPARTMENT_OTHER): Payer: HMO

## 2018-08-31 ENCOUNTER — Other Ambulatory Visit: Payer: Self-pay

## 2018-08-31 ENCOUNTER — Encounter: Payer: Self-pay | Admitting: Medical

## 2018-08-31 ENCOUNTER — Encounter (HOSPITAL_BASED_OUTPATIENT_CLINIC_OR_DEPARTMENT_OTHER): Payer: Self-pay

## 2018-08-31 ENCOUNTER — Emergency Department (HOSPITAL_BASED_OUTPATIENT_CLINIC_OR_DEPARTMENT_OTHER)
Admission: EM | Admit: 2018-08-31 | Discharge: 2018-08-31 | Disposition: A | Discharge status: Home / Self Care | Payer: HMO | Attending: Emergency Medicine | Admitting: Emergency Medicine

## 2018-08-31 DIAGNOSIS — I5043 Acute on chronic combined systolic (congestive) and diastolic (congestive) heart failure: Secondary | ICD-10-CM

## 2018-08-31 DIAGNOSIS — Z20828 Contact with and (suspected) exposure to other viral communicable diseases: Secondary | ICD-10-CM | POA: Diagnosis not present

## 2018-08-31 DIAGNOSIS — Z87891 Personal history of nicotine dependence: Secondary | ICD-10-CM | POA: Insufficient documentation

## 2018-08-31 DIAGNOSIS — R0602 Shortness of breath: Secondary | ICD-10-CM | POA: Diagnosis not present

## 2018-08-31 DIAGNOSIS — E039 Hypothyroidism, unspecified: Secondary | ICD-10-CM | POA: Insufficient documentation

## 2018-08-31 DIAGNOSIS — I110 Hypertensive heart disease with heart failure: Secondary | ICD-10-CM | POA: Diagnosis not present

## 2018-08-31 DIAGNOSIS — Z79899 Other long term (current) drug therapy: Secondary | ICD-10-CM | POA: Diagnosis not present

## 2018-08-31 DIAGNOSIS — E1151 Type 2 diabetes mellitus with diabetic peripheral angiopathy without gangrene: Secondary | ICD-10-CM | POA: Insufficient documentation

## 2018-08-31 DIAGNOSIS — E1149 Type 2 diabetes mellitus with other diabetic neurological complication: Secondary | ICD-10-CM | POA: Diagnosis not present

## 2018-08-31 DIAGNOSIS — I5041 Acute combined systolic (congestive) and diastolic (congestive) heart failure: Secondary | ICD-10-CM | POA: Diagnosis not present

## 2018-08-31 DIAGNOSIS — Z794 Long term (current) use of insulin: Secondary | ICD-10-CM | POA: Diagnosis not present

## 2018-08-31 DIAGNOSIS — I10 Essential (primary) hypertension: Secondary | ICD-10-CM | POA: Diagnosis not present

## 2018-08-31 DIAGNOSIS — J45909 Unspecified asthma, uncomplicated: Secondary | ICD-10-CM | POA: Insufficient documentation

## 2018-08-31 DIAGNOSIS — R635 Abnormal weight gain: Secondary | ICD-10-CM | POA: Diagnosis not present

## 2018-08-31 LAB — COMPREHENSIVE METABOLIC PANEL
ALT: 29 U/L (ref 0–44)
AST: 50 U/L — ABNORMAL HIGH (ref 15–41)
Albumin: 3.5 g/dL (ref 3.5–5.0)
Alkaline Phosphatase: 113 U/L (ref 38–126)
Anion gap: 11 (ref 5–15)
BUN: 21 mg/dL (ref 8–23)
CO2: 25 mmol/L (ref 22–32)
Calcium: 8.3 mg/dL — ABNORMAL LOW (ref 8.9–10.3)
Chloride: 94 mmol/L — ABNORMAL LOW (ref 98–111)
Creatinine, Ser: 0.98 mg/dL (ref 0.44–1.00)
GFR calc Af Amer: >60 mL/min (ref >60)
GFR calc non Af Amer: 59 mL/min — ABNORMAL LOW (ref >60)
Glucose, Bld: 265 mg/dL — ABNORMAL HIGH (ref 70–99)
Potassium: 3.6 mmol/L (ref 3.5–5.1)
Sodium: 130 mmol/L — ABNORMAL LOW (ref 135–145)
Total Bilirubin: 0.6 mg/dL (ref 0.3–1.2)
Total Protein: 6.7 g/dL (ref 6.5–8.1)

## 2018-08-31 LAB — CBC WITH DIFFERENTIAL/PLATELET
Abs Immature Granulocytes: 0.02 10*3/uL (ref 0.00–0.07)
Basophils Absolute: 0 10*3/uL (ref 0.0–0.1)
Basophils Relative: 0 %
Eosinophils Absolute: 0.1 10*3/uL (ref 0.0–0.5)
Eosinophils Relative: 1 %
HCT: 26.6 % — ABNORMAL LOW (ref 36.0–46.0)
Hemoglobin: 8.2 g/dL — ABNORMAL LOW (ref 12.0–15.0)
Immature Granulocytes: 1 %
Lymphocytes Relative: 18 %
Lymphs Abs: 0.7 10*3/uL (ref 0.7–4.0)
MCH: 32 pg (ref 26.0–34.0)
MCHC: 30.8 g/dL (ref 30.0–36.0)
MCV: 103.9 fL — ABNORMAL HIGH (ref 80.0–100.0)
Monocytes Absolute: 0.4 10*3/uL (ref 0.1–1.0)
Monocytes Relative: 9 %
Neutro Abs: 3 10*3/uL (ref 1.7–7.7)
Neutrophils Relative %: 71 %
Platelets: 73 10*3/uL — ABNORMAL LOW (ref 150–400)
RBC: 2.56 MIL/uL — ABNORMAL LOW (ref 3.87–5.11)
RDW: 16.8 % — ABNORMAL HIGH (ref 11.5–15.5)
WBC: 4.2 10*3/uL (ref 4.0–10.5)
nRBC: 0 % (ref 0.0–0.2)

## 2018-08-31 LAB — TROPONIN I (HIGH SENSITIVITY)
Troponin I (High Sensitivity): 14 ng/L (ref <18)
Troponin I (High Sensitivity): 14 ng/L (ref <18)

## 2018-08-31 LAB — LIPASE, BLOOD: Lipase: 92 U/L — ABNORMAL HIGH (ref 11–51)

## 2018-08-31 LAB — BRAIN NATRIURETIC PEPTIDE: B Natriuretic Peptide: 81.6 pg/mL (ref 0.0–100.0)

## 2018-08-31 LAB — SARS CORONAVIRUS 2 AG (30 MIN TAT): SARS Coronavirus 2 Ag: NEGATIVE

## 2018-08-31 MED ORDER — IOHEXOL 350 MG/ML SOLN
100.0000 mL | Freq: Once | INTRAVENOUS | Status: AC | PRN
  Administered 2018-08-31: 100 mL via INTRAVENOUS

## 2018-08-31 NOTE — ED Notes (Signed)
PT amb to BR; O2 sat decreased to 90-92%, but then increased to 96-98% as pt walked back to room.

## 2018-08-31 NOTE — ED Notes (Signed)
Pt currently on O2 1.5L [place]

## 2018-08-31 NOTE — ED Triage Notes (Signed)
EDP at bedside  

## 2018-08-31 NOTE — ED Notes (Signed)
Pt on monitor 

## 2018-08-31 NOTE — ED Provider Notes (Signed)
4:06 PM Care answer to me.  The lab work is overall reassuring.  The patient is having intermittent cramping and associated with her recent bowel cleanout I wonder if this is more from some mild dehydration.  She states she gained the 10 pounds that she lost back earlier today.  However she did have hypoxia earlier in the day so I will get a CT to rule out PE given the clear chest x-ray and normal BNP.  She could have occult fluid not seen on chest x-ray.  However her sats could have been falsely low in triage and so I will turn off the oxygen and see what she does.  Patient feels fine.  O2 sats have remained well despite her not being on oxygen.  I think the first O2 sat in triage was probably falsely abnormal.  She was able to ambulate without getting too short of breath.  At one point her sats went into the low 90s but never below 90.  CT shows may be edema versus atypical infection.  Probably this is mild edema.  I recommended she continue her Lasix and follow-up closely with her PCP.  She wants to go home and I do not think she needs to be admitted.  Troponins are negative x2.  PE study is negative.  I did discuss the distal esophagus thickening and the need for outpatient GI follow-up.  We discussed return precautions.  Results for orders placed or performed during the hospital encounter of 08/31/18  SARS Coronavirus 2 (Hosp order,Performed in [facility] lab via [employer] ID)   Specimen: Dry Nasal Swab ([employer] ID Now)  Result Value Ref Range   SARS Coronavirus 2 ([employer] [facility] Now) NEGATIVE NEGATIVE  CBC with Differential  Result Value Ref Range   WBC 4.2 4.0 - 10.5 K/uL   RBC 2.56 (L) 3.87 - 5.11 MIL/uL   Hemoglobin 8.2 (L) 12.0 - 15.0 g/dL   HCT 26.6 (L) 36.0 - 46.0 %   MCV 103.9 (H) 80.0 - 100.0 fL   MCH 32.0 26.0 - 34.0 pg   MCHC 30.8 30.0 - 36.0 g/dL   RDW 16.8 (H) 11.5 - 15.5 %   Platelets 73 (L) 150 - 400 K/uL   nRBC 0.0 0.0 - 0.2 %   Neutrophils Relative % 71 %   Neutro Abs 3.0 1.7 -  7.7 K/uL   Lymphocytes Relative 18 %   Lymphs Abs 0.7 0.7 - 4.0 K/uL   Monocytes Relative 9 %   Monocytes Absolute 0.4 0.1 - 1.0 K/uL   Eosinophils Relative 1 %   Eosinophils Absolute 0.1 0.0 - 0.5 K/uL   Basophils Relative 0 %   Basophils Absolute 0.0 0.0 - 0.1 K/uL   Immature Granulocytes 1 %   Abs Immature Granulocytes 0.02 0.00 - 0.07 K/uL  Troponin I (High Sensitivity)  Result Value Ref Range   Troponin I (High Sensitivity) 14 <18 ng/L  Troponin I (High Sensitivity)  Result Value Ref Range   Troponin I (High Sensitivity) 14 <18 ng/L  Brain natriuretic peptide  Result Value Ref Range   B Natriuretic Peptide 81.6 0.0 - 100.0 pg/mL  Comprehensive metabolic panel  Result Value Ref Range   Sodium 130 (L) 135 - 145 mmol/L   Potassium 3.6 3.5 - 5.1 mmol/L   Chloride 94 (L) 98 - 111 mmol/L   CO2 25 22 - 32 mmol/L   Glucose, Bld 265 (H) 70 - 99 mg/dL   BUN 21 8 - 23 mg/dL  Creatinine, Ser 0.98 0.44 - 1.00 mg/dL   Calcium 8.3 (L) 8.9 - 10.3 mg/dL   Total Protein 6.7 6.5 - 8.1 g/dL   Albumin 3.5 3.5 - 5.0 g/dL   AST 50 (H) 15 - 41 U/L   ALT 29 0 - 44 U/L   Alkaline Phosphatase 113 38 - 126 U/L   Total Bilirubin 0.6 0.3 - 1.2 mg/dL   GFR calc non Af Amer 59 (L) >60 mL/min   GFR calc Af Amer >60 >60 mL/min   Anion gap 11 5 - 15  Lipase, blood  Result Value Ref Range   Lipase 92 (H) 11 - 51 U/L   Dg Abd 1 View  Result Date: 08/29/2018 CLINICAL DATA:  Nausea EXAM: ABDOMEN - 1 VIEW COMPARISON:  12/12/2017 FINDINGS: Scattered large and small bowel gas is noted. Fecal material is noted throughout the colon consistent with a mild degree of constipation. No free air is seen. Rounded calcification is noted in the right upper quadrant suspicious for underlying gallstone. No abnormal mass is seen. Degenerative changes of the lumbar spine are seen. IMPRESSION: Changes of mild constipation. Cholelithiasis Electronically Signed   By: Inez Catalina M.D.   On: 08/29/2018 10:57   Ct Angio  Chest Pe W And/or Wo Contrast  Result Date: 08/31/2018 CLINICAL DATA:  10 pound weight gain in the last 3 days. History of CHF. Shortness of breath with exertion. Ankle and calf cramps. EXAM: CT ANGIOGRAPHY CHEST WITH CONTRAST TECHNIQUE: Multidetector CT imaging of the chest was performed using the standard protocol during bolus administration of intravenous contrast. Multiplanar CT image reconstructions and MIPs were obtained to evaluate the vascular anatomy. CONTRAST:  161m OMNIPAQUE IOHEXOL 350 MG/ML SOLN COMPARISON:  September 14, 2008 FINDINGS: Cardiovascular: A pacemaker is identified. Atherosclerotic changes are seen in the nonaneurysmal aorta. No obvious dissection although evaluation for dissection is limited. Cardiomegaly is noted. There are coronary artery calcifications in the right left coronary arteries as well. No pulmonary emboli. Mediastinum/Nodes: The wall of the distal esophagus is prominent as seen on axial image 66. There is a prominent node in the mediastinum on axial image 34 measuring 12 mm. The subcarinal node measures approximately 12 mm, mildly prominent. No other adenopathy. No effusions. Lungs/Pleura: Central airways are normal. No pneumothorax. Mixed attenuation is seen in the lung parenchyma with regions of higher and lower attenuation. These findings are most pronounced in the upper lobes but present throughout. No intra lobular septal thickening. There is a tiny calcified nodule in the right lung base on axial image 66 measuring 3 mm. Another calcified 3 mm nodule is seen on the right lung base on image 55. No other nodules or masses. No other pulmonary opacities. Upper Abdomen: Prominent gastrohepatic ligament nodes are identified such as on axial image 84 with a representative node measuring 9 mm in short axis. The liver demonstrates a nodular contour. Musculoskeletal: No chest wall abnormality. No acute or significant osseous findings. Review of the MIP images confirms the above  findings. IMPRESSION: 1. No pulmonary emboli. 2. Heterogeneous attenuation of the lungs with scattered ground-glass opacities. The findings could represent scattered atelectasis, mild edema, or regions of air trapping. An atypical infection is considered less likely. Recommend clinical correlation. 3. The wall of the distal esophagus is prominent, perhaps thickened. This could be due to poor distention or esophagitis. Recommend clinical correlation. An upper GI or endoscopy could further evaluate if clinically warranted. 4. Two mildly enlarged lymph nodes in the mediastinum are nonspecific  but could be reactive. Recommend attention on follow-up. 5. Prominent gastrohepatic ligament nodes are identified, nonspecific. 6. Cardiomegaly. Coronary artery calcifications and atherosclerotic change in the nonaneurysmal aorta. Aortic Atherosclerosis (ICD10-I70.0). Electronically Signed   By: Dorise Bullion III M.D   On: 08/31/2018 17:11   Dg Chest Port 1 View  Result Date: 08/31/2018 CLINICAL DATA:  10 pound weight gain over 3 days EXAM: PORTABLE CHEST 1 VIEW COMPARISON:  04/07/2016 FINDINGS: Cardiac shadow is enlarged. Pacing device is again seen and stable. The lungs are well aerated bilaterally. No significant infiltrate or effusion is noted. No bony abnormality is seen. IMPRESSION: No acute abnormality noted. Electronically Signed   By: Inez Catalina M.D.   On: 08/31/2018 14:25      Sherwood Gambler, MD 08/31/18 [phone]

## 2018-08-31 NOTE — ED Provider Notes (Signed)
[facility] EMERGENCY DEPARTMENT Provider Note   CSN: [id number] Arrival date & time: 08/31/18  1341    History   Chief Complaint Chief Complaint  Patient presents with  . Congestive Heart Failure    HPI Alexis [name] is a 68 y.o. female.     68 yo F with a cc of sob.  Going on for the past couple days.  Patient with recent fluid shifts.  Patient seen at cardiologist office and given milk of magnesia.  Patient with diarrhea all night had 10lb weight loss.  Since then the patient has had a 10 pound weight gain.  Again she is very short of breath on exertion.  Denies any lower extremity edema.  States normally she holds in her abdomen.  Went to see her endocrinologist today and was told that she likely has diabetic gastroparesis and has a gastric emptying study scheduled for next week.  Patient however was so fatigued and had difficulty walking even a few steps that she decided to come to the ED for evaluation.  Denies chest pain denies cough denies fever.  The history is provided by the patient.  Congestive Heart Failure This is a recurrent problem. The current episode started 2 days ago. The problem occurs constantly. The problem has been gradually worsening. Associated symptoms include shortness of breath. Pertinent negatives include no chest pain, no abdominal pain and no headaches. The symptoms are aggravated by exertion. Nothing relieves the symptoms. She has tried nothing for the symptoms. The treatment provided no relief.    Past Medical History:  Diagnosis Date  . Allergy   . Anxiety   . Arthritis   . Asthma   . Atrial fibrillation ([place])   . CHF (congestive heart failure) ([place])   . COPD (chronic obstructive pulmonary disease) ([place])   . Diabetes mellitus without complication ([place])   . Hyperlipidemia   . Hypertension   . Myocardial infarction ([place])    several  . Pacemaker    CRT-P with RV lead and His Bundle lead ( high threshold)   . Peripheral neuropathy    . Pneumonia   . PONV (postoperative nausea and vomiting)    unsure of exactly what happened at [facility] in 2010 (knee surgery) that led to crital care  . Thyroid disease     Patient Active Problem List   Diagnosis Date Noted  . Pacemaker 08/25/2018  . Insulin dependent diabetes mellitus with complications ([place]) 40/98/1191  . Constipation 08/25/2018  . Fluid overload 08/25/2018  . Chronic anticoagulation 08/25/2018  . Morbid obesity ([place]) 08/25/2018  . Chronic diastolic heart failure ([place]) 05/17/2018  . Acute bronchitis with COPD ([place]) 01/16/2018  . Acquired absence of other right toe(s) ([place]) 11/16/2017  . Diabetic ulcer of toe of right foot associated with type 2 diabetes mellitus ([place]) 11/16/2017  . Crush injury of left foot, initial encounter 11/28/2016  . Osteomyelitis ([place]) 07/30/2016  . Gangrene of foot ([place]) 04/27/2016  . Thrombocytopenia ([place])   . Vomiting and diarrhea 04/05/2016  . Hyperlipidemia 11/18/2015  . Gout 08/05/2014  . Cough 08/05/2014  . Allergic rhinitis 07/12/2014  . Osteoarthritis 07/12/2014  . Asthma 07/12/2014  . Permanent atrial fibrillation 07/12/2014  . HTN (hypertension) 07/12/2014  . Hypothyroidism 07/12/2014  . History of substance abuse ([place]) 07/12/2014  . Peripheral vascular disease ([place]) 04/02/2013  . Discoloration of skin 04/02/2013    Past Surgical History:  Procedure Laterality Date  . ABDOMINAL AORTOGRAM W/LOWER EXTREMITY N/A 04/29/2016  Procedure: Abdominal Aortogram w/Lower Extremity;  Surgeon: Waynetta Sandy, MD;  Location: [facility];  Service: Cardiovascular;  Laterality: N/A;  . ABDOMINAL AORTOGRAM W/LOWER EXTREMITY N/A 05/06/2017   Procedure: ABDOMINAL AORTOGRAM W/LOWER EXTREMITY;  Surgeon: Angelia Mould, MD;  Location: [facility];  Service: Cardiovascular;  Laterality: N/A;  bilateral  . ABDOMINAL HYSTERECTOMY    . AMPUTATION Right 07/30/2016   Procedure: AMPUTATION RIGHT SECOND TOE;   Surgeon: Angelia Mould, MD;  Location: [facility];  Service: Vascular;  Laterality: Right;  . CARDIAC CATHETERIZATION     2005 at [facility]  . PERIPHERAL VASCULAR ATHERECTOMY Right 04/29/2016   Procedure: Peripheral Vascular Atherectomy-Right Popliteal;  Surgeon: Waynetta Sandy, MD;  Location: [facility];  Service: Cardiovascular;  Laterality: Right;  . PERIPHERAL VASCULAR INTERVENTION Right 04/29/2016   Procedure: Peripheral Vascular Intervention-Right Popliteal;  Surgeon: Waynetta Sandy, MD;  Location: [facility];  Service: Cardiovascular;  Laterality: Right;  POPLITEAL PTA  . RADIOFREQUENCY ABLATION    . TOTAL KNEE ARTHROPLASTY    . TUBAL LIGATION       OB History   No obstetric history on file.      Home Medications    Prior to Admission medications   Medication Sig Start Date End Date Taking? Authorizing Provider  albuterol (PROVENTIL HFA;VENTOLIN HFA) 108 (90 Base) MCG/ACT inhaler Inhale 2 puffs into the lungs every 6 (six) hours as needed for wheezing or shortness of breath. 03/15/17   Saguier, Percell Miller, PA-C  allopurinol (ZYLOPRIM) 100 MG tablet TAKE ONE TABLET BY MOUTH TWICE DAILY 08/07/18   Saguier, Percell Miller, PA-C  AMBULATORY NON [place] scooter.  Diagnosis: Osteoarthritis M19.90 09/10/16   Carollee Herter, Alferd Apa, DO  atorvastatin (LIPITOR) 20 MG tablet Take 1 tablet (20 mg total) by mouth daily at 6 PM. Patient not taking: Reported on 08/25/2018 05/17/18 08/15/18  Lelon Perla, MD  B-D ULTRAFINE III SHORT PEN 31G X 8 MM MISC  05/26/18   [provider]  Blood Glucose Monitoring Suppl (GLUCOCOM BLOOD GLUCOSE MONITOR) DEVI Use to check blood sugars 6 times daily E11.65 05/01/15   [provider]  cephALEXin (KEFLEX) 500 MG capsule  03/07/18   [provider]  Continuous Blood Gluc Sensor (FREESTYLE LIBRE 14 DAY SENSOR) MISC  05/20/18   [provider]  DULoxetine (CYMBALTA) 30 MG  capsule TAKE ONE CAPSULE BY MOUTH EVERY NIGHT AT BEDTIME Patient not taking: Reported on 08/25/2018 05/16/18   Saguier, Percell Miller, PA-C  fluconazole (DIFLUCAN) 150 MG tablet  03/07/18   [provider]  fluticasone (FLONASE) 50 MCG/ACT nasal spray Place 2 sprays into both nostrils at bedtime. Patient taking differently: Place 2 sprays into both nostrils as needed.  07/31/16   Alvia Grove, PA-C  fluticasone (FLOVENT HFA) 110 MCG/ACT inhaler Inhale 2 puffs into the lungs 2 (two) times daily as needed (bronchitis). 07/31/16   Alvia Grove, PA-C  hydrocortisone 2.5 % cream  03/07/18   [provider]  Insulin Disposable Pump (V-GO 40) KIT Inject into the skin See admin instructions. Use with Novolog - 6 pumps before each meal 11/21/15   [provider]  Insulin Glargine (LANTUS SOLOSTAR) 100 UNIT/ML Solostar Pen Inject 40 units under skin two times daily. DX E11.42 03/08/18   [provider]  Insulin Pen Needle (FIFTY50 PEN NEEDLES) 31G X 8 MM MISC Use as needed for 2 injections daily 05/26/18   [provider]  LANTUS SOLOSTAR 100 UNIT/ML Solostar Pen  03/08/18   [provider]  levocetirizine (XYZAL) 5 MG tablet Take 1 tablet (5 mg total) by mouth every evening. 06/14/18   Saguier, Percell Miller, PA-C  Levothyroxine Sodium 150 MCG CAPS Take 1 capsule (150 mcg total) by mouth daily before breakfast. 09/20/17   Saguier, Percell Miller, PA-C  metoprolol succinate (TOPROL-XL) 25 MG 24 hr tablet TAKE 1 TABLET BY MOUTH DAILY 05/16/18   Saguier, Percell Miller, PA-C  NOVOLOG 100 UNIT/ML injection 100 units per day in VGO unknown bolus 06/23/16   [provider]  pantoprazole (PROTONIX) 40 MG tablet Take 1 tablet (40 mg total) by mouth 2 (two) times daily. 08/25/18   Erlene Quan, PA-C  Prenatal Vit-Fe Fumarate-FA (MULTIVITAMIN-PRENATAL) 27-0.8 MG TABS tablet Take 1 tablet by mouth daily. Takes for nail and hair growth     [provider]  psyllium (METAMUCIL SMOOTH  TEXTURE) 58.6 % powder Take 1 packet by mouth daily. 08/25/18   Erlene Quan, PA-C  rosuvastatin (CRESTOR) 20 MG tablet Take 1 tablet (20 mg total) by mouth daily. 06/14/18   Saguier, Percell Miller, PA-C  Semaglutide 0.25 or 0.5 MG/DOSE SOPN Inject 0.75m weekly 05/30/17   [provider]  torsemide (DEMADEX) 10 MG tablet Take 1 tablet (10 mg total) by mouth daily. 06/13/18   Saguier, EPercell Miller PA-C    Family History Family History  Problem Relation Age of Onset  . Diabetes Mother   . Hyperlipidemia Mother   . Diabetes Father   . Hyperlipidemia Father   . Heart disease Father        before age 70 . Heart attack Father   . Diabetes Brother   . Hyperlipidemia Brother   . Heart attack Brother     Social History Social History   Tobacco Use  . Smoking status: Former Smoker    Quit date: 03/01/1998    Years since quitting: 20.5  . Smokeless tobacco: Never Used  Substance Use Topics  . 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  . Drug use: Yes    Types: Marijuana    Comment: remote use     Allergies   Indomethacin, Pregabalin, Sulfa antibiotics, and Morphine and related   Review of Systems Review of Systems  Constitutional: Negative for chills and fever.  HENT: Negative for congestion and rhinorrhea.   Eyes: Negative for redness and visual disturbance.  Respiratory: Positive for shortness of breath. Negative for wheezing.   Cardiovascular: Negative for chest pain and palpitations.  Gastrointestinal: Negative for abdominal pain, nausea and vomiting.  Genitourinary: Negative for dysuria and urgency.  Musculoskeletal: Negative for arthralgias and myalgias.  Skin: Negative for pallor and wound.  Neurological: Negative for dizziness and headaches.     Physical Exam Updated Vital Signs BP 136/63   Pulse 67   Temp 98.6 F (37 C) (Oral)   Resp 20   Wt 107.5 kg   SpO2 100%   BMI 43.35 kg/m    Physical Exam Vitals signs and nursing note reviewed.  Constitutional:      General: She is not in acute distress.    Appearance: She is well-developed. She is obese. She is not diaphoretic.  HENT:     Head: Normocephalic and atraumatic.  Eyes:     Pupils: Pupils are equal, round, and reactive to light.  Neck:     Musculoskeletal: Normal range of motion  and neck supple.  Cardiovascular:     Rate and Rhythm: Normal rate and regular rhythm.     Heart sounds: No murmur. No friction rub. No gallop.   Pulmonary:     Effort: Pulmonary effort is normal.     Breath sounds: Rales (bases) present. No wheezing.  Abdominal:     General: There is no distension.     Palpations: Abdomen is soft.     Tenderness: There is no abdominal tenderness.  Musculoskeletal:        General: No tenderness.  Skin:    General: Skin is warm and dry.  Neurological:     Mental Status: She is alert and oriented to person, place, and time.  Psychiatric:        Behavior: Behavior normal.      ED Treatments / Results  Labs (all labs ordered are listed, but only abnormal results are displayed) Labs Reviewed  SARS CORONAVIRUS 2 (HOSP ORDER, PERFORMED IN [facility] VIA [facility] ID)  CBC WITH DIFFERENTIAL/PLATELET  TROPONIN I (HIGH SENSITIVITY)  TROPONIN I (HIGH SENSITIVITY)  BRAIN NATRIURETIC PEPTIDE  COMPREHENSIVE METABOLIC PANEL  LIPASE, BLOOD    EKG EKG Interpretation  Date/Time:  Thursday August 31 2018 14:11:55 EDT Ventricular Rate:  70 PR Interval:    QRS Duration: 96 QT Interval:  390 QTC Calculation: 421 R Axis:   109 Text Interpretation:  Atrial fibrillation Right axis deviation Low voltage, precordial leads Borderline repolarization abnormality Since last tracing rate slower Otherwise no significant change Confirmed by Deno Etienne [id number]) on 08/31/2018 2:18:19 PM Also confirmed by Deno Etienne [phone]), editor Philomena Doheny [phone])  on 08/31/2018 3:09:30 PM   Radiology Dg Chest Port 1 View   Result Date: 08/31/2018 CLINICAL DATA:  10 pound weight gain over 3 days EXAM: PORTABLE CHEST 1 VIEW COMPARISON:  04/07/2016 FINDINGS: Cardiac shadow is enlarged. Pacing device is again seen and stable. The lungs are well aerated bilaterally. No significant infiltrate or effusion is noted. No bony abnormality is seen. IMPRESSION: No acute abnormality noted. Electronically Signed   By: Inez Catalina M.D.   On: 08/31/2018 14:25    Procedures Procedures (including critical care time)  Medications Ordered in ED Medications - No data to display   Initial Impression / Assessment and Plan / ED Course  I have reviewed the triage vital signs and the nursing notes.  Pertinent labs & imaging results that were available during my care of the patient were reviewed by me and considered in my medical decision making (see chart for details).        68 yo F with a significant past medical history of heart failure comes in with a chief complaint of shortness of breath on exertion.  Going on for the past couple days.  Patient recently has had significant fluid shifts had constipation and then had diarrhea and a 10 pound weight loss and then has gained that weight back.  Only able to make it a few steps without becoming acutely short of breath.  No chest pain.  Patient is morbidly obese and so is difficult to assess her fluid status though she does have rales at the bases.  With her reported weight gain a suspect that she is fluid overloaded.  Requiring oxygen which is new for her.  Likely need admission to the hospital.  Case was discussed with Dr. Regenia Skeeter please see his note for further details care in the ED.    The patients results and plan were  reviewed and discussed.   Any x-rays performed were independently reviewed by myself.   Differential diagnosis were considered with the presenting HPI.  Medications - No data to display  Vitals:   08/31/18 1355 08/31/18 1359 08/31/18 1442 08/31/18 1449  BP: (!)  146/127  136/63   Pulse: 70  67   Resp: (!) 24  20   Temp: 98.6 F (37 C)     TempSrc: Oral     SpO2: (!) 89%  100% 100%  Weight:  107.5 kg      Final diagnoses:  Acute on chronic combined systolic and diastolic congestive heart failure (HCC)    Admission/ observation were discussed with the admitting physician, patient and/or family and they are comfortable with the plan.    Final Clinical Impressions(s) / ED Diagnoses   Final diagnoses:  Acute on chronic combined systolic and diastolic congestive heart failure [facility])    ED Discharge Orders    None       Deno Etienne, DO 08/31/18 1521

## 2018-08-31 NOTE — Discharge Instructions (Addendum)
You appear to have mild congestive heart failure.  If you develop chest pain, worsening shortness of breath, or any other new/concerning symptoms then return to the ER for evaluation.  Your CT scan shows that there is some thickening to your distal esophagus.  You need to follow-up with gastroenterology for further evaluation.

## 2018-08-31 NOTE — ED Triage Notes (Signed)
Pt reports 10 lbs of weight gain in the last 3 days. Pt has hx of CHF. Pt noted to be ShOB with exertion with ambulation to the exam room.   Pt also states she has been taking mag citrate for constipation do to a "GI Issue." Pt c/o ankle and calf cramps.

## 2018-09-01 ENCOUNTER — Encounter: Payer: Self-pay | Admitting: Medical

## 2018-09-04 ENCOUNTER — Telehealth: Payer: Self-pay | Admitting: Medical

## 2018-09-04 ENCOUNTER — Other Ambulatory Visit: Payer: Self-pay | Admitting: Hematology

## 2018-09-04 ENCOUNTER — Encounter: Payer: Self-pay | Admitting: Hematology

## 2018-09-04 DIAGNOSIS — D696 Thrombocytopenia, unspecified: Secondary | ICD-10-CM

## 2018-09-04 DIAGNOSIS — D539 Nutritional anemia, unspecified: Secondary | ICD-10-CM

## 2018-09-04 HISTORY — DX: Nutritional anemia, unspecified: D53.9

## 2018-09-04 NOTE — Progress Notes (Signed)
[name]  Patient Care Team: Saguier, Iris Pert as PCP - General (Internal Medicine) Constance Haw, MD as PCP - Electrophysiology (Cardiology) Stanford Breed Denice Bors, MD as PCP - Cardiology (Cardiology) Riccardo Dubin, PA-C (Internal Medicine) Elayne Guerin, [facility] as [place] Management (Pharmacist) [place] Ellison, RN as Impact Management  HEME/ONC OVERVIEW: 1. Macrocytic anemia -Hgb fluctuates widely between 11 and 14 since at least 2017 -Acute decline to mid-8's since 07/2018; possible esophagitis on CT in 07/2018   2. Thrombocytopenia -Plts fluctuates between 70-100's since at least 2017  ASSESSMENT & PLAN:   Macrocytic anemia -I reviewed the patient's records in detail, including recent ER and PCP clinic notes, lab studies and imaging results -I also independently reviewed the radiologic images of recent CTA chest, and agree with findings as documented -In summary, patient has had intermittent mild anemia dating back to at least 2017, with Hgb fluctuating widely between 11 and 14.  Her Hgb was 12.5 in 04/2018, but dropped acutely to the mid-8's in 07/2018.  She presented to [facility] ER for dyspnea and CTA chest was negative for PTE, but did demonstrate possible thickening of the distal esophageal wall, concerning for esophagitis.  Patient has been referred to gastroenterology, currently scheduled on 09/11/2018. -I reviewed the lab and imaging results in detail with the patient -Hgb 8.5 today, stable  -I personally reviewed the peripheral blood smear, which showed mildly macrocytic RBC's with a few scattered stomatocytes, suggesting possible liver disease; WBC morphology was relatively normal; platelet count was decreased with some intermittent immature platelets. There was no platelet clumping.  -I have ordered iron profile, including soluble transferrin, as well as B12, folate, and copper to rule out  nutritional deficiencies -Given the significant decline in Hgb since 04/2018, this is concerning for acute blood loss, possibly in the GI tract; patient is currently scheduled to see GI on 09/11/2018, and I strongly encouraged the patient to keep that appt so that GI bleeding can be ruled out, especially as she has never had colonoscopy in the past and her EGD was done over 15 years ago -Pending the work-up above, including gastroenterology evaluation, we will determine if the patient requires any further investigation of the anemia  Thrombocytopenia -Reviewed patient's labs dating back to 2017 showed platelet count fluctuating between 70 and 100k's -Hep C negative in 2019, but she has not had any prior abdominal imaging to assess for liver disease -Plts 73k today, stable  -Review of the peripheral blood smear results as above -I have ordered infectious studies, including HIV and Hep B serologies, as well as coagulation studies -In light of the patient's obesity and hx of EtOH abuse, she is at increased risk of developing liver disease -However, due to her large body habitus, abdominal US is likely going to be limited in quality; therefore, I have ordered CT abdomen/pelvis w/ contrast to assess for any liver abnormalities -I counseled the patient on any concerning symptoms, including abnormal bleeding or excess bruising, for which she should seek further evaluation  Melena -Patient reports several days of black stool after she did a trial laxatives approximately 2 weeks ago -Given the acute decline in hemoglobin since 04/2018, this is concerning for GI bleeding -Furthermore, recent CTA chest demonstrated possible thickening of the distal esophageal wall, concerning for esophagitis, which further raises suspicion for GI bleeding -As patient has not had any colonoscopy in the past, I have ordered CT abdomen/pelvis with contrast  to rule out any acute intra-abdominal pathology, including  malignancy  Orders Placed This Encounter  Procedures  . CT ABDOMEN PELVIS W CONTRAST    Standing Status:   Future    Standing Expiration Date:   09/08/2019    Order Specific Question:   ** REASON FOR EXAM (FREE TEXT)    Answer:   2 weeks of melena, acute on chronic anemia, rule out GI bleeding; thrombocytopenia, rule out liver disease    Order Specific Question:   If indicated for the ordered procedure, I authorize the administration of contrast media per Radiology protocol    Answer:   Yes    Order Specific Question:   Preferred imaging location?    Answer:   [facility] Specific Question:   Is Oral Contrast requested for this exam?    Answer:   Yes, Per Radiology protocol    Order Specific Question:   Radiology Contrast Protocol - do NOT remove file path    Answer:   [email or web address]  . CBC with Differential ([place] Only)    Standing Status:   Future    Standing Expiration Date:   10/13/2019  . CMP ([place] only)    Standing Status:   Future    Standing Expiration Date:   10/13/2019  . Save Smear (SS[facility])    Standing Status:   Future    Standing Expiration Date:   09/08/2019  . Lactate dehydrogenase    Standing Status:   Future    Standing Expiration Date:   10/13/2019   All questions were answered. The patient knows to call the clinic with any problems, questions or concerns.  Return in 1 month for labs, imaging results and clinic appt.   Tish Men, MD 09/08/2018 1:55 PM   CHIEF COMPLAINTS/PURPOSE OF CONSULTATION:  "I need a cardiologist"  HISTORY OF PRESENTING ILLNESS:  Alexis [name] 68 y.o. female is here because of macrocytic anemia and thrombocytopenia.  The patient has extensive medical comorbidities, including peripheral vascular disease, chronic atrial fibrillation not on anticoagulation, diabetes, and hypothyroidism.  She has had longstanding, intermittent anemia with Hgb between 11 and 14 since at least 2017.   She presented to her PCP for routine evaluation 04/2018, and CBC at that time showed Hgb of 12.5, but subsequent labs in 07/2018 showed an acute decline in Hgb to mid-8's and has remained there since then.  In addition, she has had chronic thrombocytopenia with platelet count between 70 and 100k's since at least 2017.  Patient reports that she has had chronic constipation, for which her PCP instructed her to try OTC magnesium citrate 2 weeks ago.  She was able to have bowel movements after taking the laxative, but she had several days of black stools.  She denied any associated abdominal pain, hematemesis, hemoptysis, hematochezia.  She has almost daily nausea, but denies any vomiting.  She was recently taken off of Eliquis and ramipril by her PCP, presumably for her low platelet count and fluctuating renal function.  She has never had a colonoscopy in the past due to her brother having had colon perforation from colonoscopy many years ago.  Her last EGD was over 15 years ago, but she was unsure about the reason for the procedure.  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  She denies any other complaint today.  MEDICAL HISTORY:  Past Medical History:  Diagnosis Date  . Allergy   .  Anxiety   . Arthritis   . Asthma   . Atrial fibrillation ([place])   . CHF (congestive heart failure) ([place])   . COPD (chronic obstructive pulmonary disease) ([place])   . Diabetes mellitus without complication ([place])   . Hyperlipidemia   . Hypertension   . Macrocytic anemia 09/04/2018  . Myocardial infarction ([place])    several  . Pacemaker    CRT-P with RV lead and His Bundle lead ( high threshold)   . Peripheral neuropathy   . Pneumonia   . PONV (postoperative nausea and vomiting)    unsure of exactly what happened at [facility] in 2010 (knee surgery) that led to crital care  . Thyroid disease     SURGICAL HISTORY: Past Surgical History:  Procedure Laterality  Date  . ABDOMINAL AORTOGRAM W/LOWER EXTREMITY N/A 04/29/2016   Procedure: Abdominal Aortogram w/Lower Extremity;  Surgeon: Waynetta Sandy, MD;  Location: [place] [facility];  Service: Cardiovascular;  Laterality: N/A;  . ABDOMINAL AORTOGRAM W/LOWER EXTREMITY N/A 05/06/2017   Procedure: ABDOMINAL AORTOGRAM W/LOWER EXTREMITY;  Surgeon: Angelia Mould, MD;  Location: [place] [facility];  Service: Cardiovascular;  Laterality: N/A;  bilateral  . ABDOMINAL HYSTERECTOMY    . AMPUTATION Right 07/30/2016   Procedure: AMPUTATION RIGHT SECOND TOE;  Surgeon: Angelia Mould, MD;  Location: [place];  Service: Vascular;  Laterality: Right;  . CARDIAC CATHETERIZATION     2005 at [facility]  . PERIPHERAL VASCULAR ATHERECTOMY Right 04/29/2016   Procedure: Peripheral Vascular Atherectomy-Right Popliteal;  Surgeon: Waynetta Sandy, MD;  Location: [place] [facility];  Service: Cardiovascular;  Laterality: Right;  . PERIPHERAL VASCULAR INTERVENTION Right 04/29/2016   Procedure: Peripheral Vascular Intervention-Right Popliteal;  Surgeon: Waynetta Sandy, MD;  Location: [place] [facility];  Service: Cardiovascular;  Laterality: Right;  POPLITEAL PTA  . RADIOFREQUENCY ABLATION    . TOTAL KNEE ARTHROPLASTY    . TUBAL LIGATION      SOCIAL HISTORY: Social History   Socioeconomic History  . Marital status: Married    Spouse name: Not on file  . Number of children: Not on file  . Years of education: Not on file  . Highest education level: Not on file  Occupational History  . Occupation: retired  Scientific laboratory technician  . Financial resource strain: Not on file  . Food insecurity    Worry: Not on file    Inability: Not on file  . Transportation needs    Medical: Not on file    Non-medical: Not on file  Tobacco Use  . Smoking status: Former Smoker    Quit date: 03/01/1998    Years since quitting: 20.5  . Smokeless tobacco: Never Used  Substance and Sexual Activity  . 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  . Drug use: Yes    Types: Marijuana    Comment: remote use  . Sexual activity: Never  Lifestyle  . Physical activity    Days per week: Not on file    Minutes per session: Not on file  . Stress: Not on file  Relationships  . Social Herbalist on phone: Not on file    Gets together: Not on file    Attends religious service: Not on file    Active member of club or organization: Not on file    Attends meetings of clubs  or organizations: Not on file    Relationship status: Not on file  . Intimate partner violence    Fear of current or ex partner: Not on file    Emotionally abused: Not on file    Physically abused: Not on file    Forced sexual activity: Not on file  Other Topics Concern  . Not on file  Social History Narrative  . Not on file    FAMILY HISTORY: Family History  Problem Relation Age of Onset  . Diabetes Mother   . Hyperlipidemia Mother   . Diabetes Father   . Hyperlipidemia Father   . Heart disease Father        before age 67  . Heart attack Father   . Diabetes Brother   . Hyperlipidemia Brother   . Heart attack Brother     ALLERGIES:  is allergic to indomethacin; pregabalin; sulfa antibiotics; and morphine and related.  MEDICATIONS:  Current Outpatient Medications  Medication Sig Dispense Refill  . albuterol (PROVENTIL HFA;VENTOLIN HFA) 108 (90 Base) MCG/ACT inhaler Inhale 2 puffs into the lungs every 6 (six) hours as needed for wheezing or shortness of breath. 1 Inhaler 2  . allopurinol (ZYLOPRIM) 100 MG tablet TAKE ONE TABLET BY MOUTH TWICE DAILY 60 tablet 3  . AMBULATORY NON FORMULARY MEDICATION Motorized scooter.  Diagnosis: Osteoarthritis M19.90 1 each 0  . Continuous Blood Gluc Sensor (FREESTYLE LIBRE 14 DAY SENSOR) MISC     . DULoxetine (CYMBALTA) 30 MG capsule TAKE ONE CAPSULE BY MOUTH EVERY NIGHT AT BEDTIME 90 capsule 1  .  fluticasone (FLONASE) 50 MCG/ACT nasal spray Place 2 sprays into both nostrils at bedtime. (Patient taking differently: Place 2 sprays into both nostrils as needed. )    . fluticasone (FLOVENT HFA) 110 MCG/ACT inhaler Inhale 2 puffs into the lungs 2 (two) times daily as needed (bronchitis).    . Insulin Disposable Pump (V-GO 40) KIT Inject into the skin See admin instructions. Use with Novolog - 6 pumps before each meal    . Levothyroxine Sodium 150 MCG CAPS Take 1 capsule (150 mcg total) by mouth daily before breakfast. 30 capsule 3  . metoprolol succinate (TOPROL-XL) 25 MG 24 hr tablet TAKE 1 TABLET BY MOUTH DAILY 90 tablet 1  . NOVOLOG 100 UNIT/ML injection 100 units per day in VGO unknown bolus    . pantoprazole (PROTONIX) 40 MG tablet Take 1 tablet (40 mg total) by mouth 2 (two) times daily. 60 tablet 4  . Prenatal Vit-Fe Fumarate-FA (MULTIVITAMIN-PRENATAL) 27-0.8 MG TABS tablet Take 1 tablet by mouth daily. Takes for nail and hair growth     . psyllium (METAMUCIL SMOOTH TEXTURE) 58.6 % powder Take 1 packet by mouth daily. 283 g 12  . Semaglutide 0.25 or 0.5 MG/DOSE SOPN Inject 0.75m weekly    . torsemide (DEMADEX) 10 MG tablet Take 1 tablet (10 mg total) by mouth daily. 90 tablet 1   No current facility-administered medications for this visit.     REVIEW OF SYSTEMS:   Constitutional: ( - ) fevers, ( - )  chills , ( - ) night sweats Eyes: ( - ) blurriness of vision, ( - ) double vision, ( - ) watery eyes Ears, nose, mouth, throat, and face: ( - ) mucositis, ( - ) sore throat Respiratory: ( - ) cough, ( + ) exertional dyspnea, ( - ) wheezes Cardiovascular: ( - ) palpitation, ( - ) chest discomfort, ( - ) lower extremity swelling Gastrointestinal:  ( + )  nausea, ( - ) heartburn, ( + ) change in bowel habits Skin: ( - ) abnormal skin rashes Lymphatics: ( - ) new lymphadenopathy, ( - ) easy bruising Neurological: ( - ) numbness, ( - ) tingling, ( - ) new weaknesses Behavioral/Psych: ( - )  mood change, ( - ) new changes  All other systems were reviewed with the patient and are negative.  PHYSICAL EXAMINATION: ECOG PERFORMANCE STATUS: 2 - Symptomatic, <50% confined to bed  Vitals:   09/08/18 1241  BP: (!) 133/59  Pulse: 70  Resp: 18  Temp: 98 F (36.7 C)  SpO2: 100%   Filed Weights   09/08/18 1241  Weight: 234 lb (106.1 kg)    GENERAL: alert, no distress and comfortable, obese  SKIN: a few scattered small bruises over bilateral forearms  EYES: conjunctiva are pink and non-injected, sclera clear OROPHARYNX: no exudate, no erythema; lips, buccal mucosa, and tongue normal  NECK: supple, non-tender LUNGS: clear to auscultation with normal breathing effort HEART: regular rate & rhythm, no murmurs, no lower extremity edema ABDOMEN: soft, non-tender, non-distended, normal bowel sounds Musculoskeletal: discoloration of the skin noted in bilateral lower extremities, consistent with PVD  PSYCH: alert & oriented x 3, fluent speech NEURO: no focal motor/sensory deficits  LABORATORY DATA:  I have reviewed the data as listed Lab Results  Component Value Date   WBC 3.8 (L) 09/08/2018   HGB 8.5 (L) 09/08/2018   HCT 27.9 (L) 09/08/2018   MCV 104.9 (H) 09/08/2018   PLT 73 (L) 09/08/2018   Lab Results  Component Value Date   NA 136 09/08/2018   K 3.6 09/08/2018   CL 99 09/08/2018   CO2 27 09/08/2018    RADIOGRAPHIC STUDIES: I have personally reviewed the radiological images as listed and agreed with the findings in the report. Dg Abd 1 View  Result Date: 08/29/2018 CLINICAL DATA:  Nausea EXAM: ABDOMEN - 1 VIEW COMPARISON:  12/12/2017 FINDINGS: Scattered large and small bowel gas is noted. Fecal material is noted throughout the colon consistent with a mild degree of constipation. No free air is seen. Rounded calcification is noted in the right upper quadrant suspicious for underlying gallstone. No abnormal mass is seen. Degenerative changes of the lumbar spine are  seen. IMPRESSION: Changes of mild constipation. Cholelithiasis Electronically Signed   By: Inez Catalina M.D.   On: 08/29/2018 10:57   Ct Angio Chest Pe W And/or Wo Contrast  Result Date: 08/31/2018 CLINICAL DATA:  10 pound weight gain in the last 3 days. History of CHF. Shortness of breath with exertion. Ankle and calf cramps. EXAM: CT ANGIOGRAPHY CHEST WITH CONTRAST TECHNIQUE: Multidetector CT imaging of the chest was performed using the standard protocol during bolus administration of intravenous contrast. Multiplanar CT image reconstructions and MIPs were obtained to evaluate the vascular anatomy. CONTRAST:  188m OMNIPAQUE IOHEXOL 350 MG/ML SOLN COMPARISON:  September 14, 2008 FINDINGS: Cardiovascular: A pacemaker is identified. Atherosclerotic changes are seen in the nonaneurysmal aorta. No obvious dissection although evaluation for dissection is limited. Cardiomegaly is noted. There are coronary artery calcifications in the right left coronary arteries as well. No pulmonary emboli. Mediastinum/Nodes: The wall of the distal esophagus is prominent as seen on axial image 66. There is a prominent node in the mediastinum on axial image 34 measuring 12 mm. The subcarinal node measures approximately 12 mm, mildly prominent. No other adenopathy. No effusions. Lungs/Pleura: Central airways are normal. No pneumothorax. Mixed attenuation is seen in the lung parenchyma with  regions of higher and lower attenuation. These findings are most pronounced in the upper lobes but present throughout. No intra lobular septal thickening. There is a tiny calcified nodule in the right lung base on axial image 66 measuring 3 mm. Another calcified 3 mm nodule is seen on the right lung base on image 55. No other nodules or masses. No other pulmonary opacities. Upper Abdomen: Prominent gastrohepatic ligament nodes are identified such as on axial image 84 with a representative node measuring 9 mm in short axis. The liver demonstrates a  nodular contour. Musculoskeletal: No chest wall abnormality. No acute or significant osseous findings. Review of the MIP images confirms the above findings. IMPRESSION: 1. No pulmonary emboli. 2. Heterogeneous attenuation of the lungs with scattered ground-glass opacities. The findings could represent scattered atelectasis, mild edema, or regions of air trapping. An atypical infection is considered less likely. Recommend clinical correlation. 3. The wall of the distal esophagus is prominent, perhaps thickened. This could be due to poor distention or esophagitis. Recommend clinical correlation. An upper GI or endoscopy could further evaluate if clinically warranted. 4. Two mildly enlarged lymph nodes in the mediastinum are nonspecific but could be reactive. Recommend attention on follow-up. 5. Prominent gastrohepatic ligament nodes are identified, nonspecific. 6. Cardiomegaly. Coronary artery calcifications and atherosclerotic change in the nonaneurysmal aorta. Aortic Atherosclerosis (ICD10-I70.0). Electronically Signed   By: Dorise Bullion III M.D   On: 08/31/2018 17:11   Dg Chest Port 1 View  Result Date: 08/31/2018 CLINICAL DATA:  10 pound weight gain over 3 days EXAM: PORTABLE CHEST 1 VIEW COMPARISON:  04/07/2016 FINDINGS: Cardiac shadow is enlarged. Pacing device is again seen and stable. The lungs are well aerated bilaterally. No significant infiltrate or effusion is noted. No bony abnormality is seen. IMPRESSION: No acute abnormality noted. Electronically Signed   By: Inez Catalina M.D.   On: 08/31/2018 14:25    PATHOLOGY: I personally reviewed the peripheral blood smear, which showed mildly macrocytic RBC's with a few scattered stomatocytes, suggesting possible liver disease; WBC morphology was relatively normal; platelet count was decreased with some intermittent immature platelets. There was no platelet clumping.

## 2018-09-04 NOTE — Telephone Encounter (Signed)
Will you get Alexis [name] scheduled for Friday 1 pm. She had ED visits recently and hematolgist appointment. Give her 1 pm to 1:40. That way if she is late it won't set me back.

## 2018-09-05 NOTE — Telephone Encounter (Signed)
Afternoon 1-1:40 ok. In office. With all her issues she needs to be in office. But I don't think convenient slot in morning and I don't want her to have to wait for me in I get busy due to her moderate risk covid score.

## 2018-09-05 NOTE — Telephone Encounter (Signed)
You want her in the afternoon with the Virtual visits or am?

## 2018-09-06 NOTE — Telephone Encounter (Signed)
The only time he have is 1:20 but pt has 12:30 appointment for 60 mins Friday was well. Can she come later or earlier.

## 2018-09-07 NOTE — Telephone Encounter (Signed)
Left pt a message to call and schedule appointment

## 2018-09-07 NOTE — Telephone Encounter (Signed)
Looks like I have 9:20-10 or 10:20-11 available. Will you offer pt.

## 2018-09-08 ENCOUNTER — Inpatient Hospital Stay (HOSPITAL_BASED_OUTPATIENT_CLINIC_OR_DEPARTMENT_OTHER): Payer: HMO | Admitting: Hematology

## 2018-09-08 ENCOUNTER — Other Ambulatory Visit: Payer: Self-pay

## 2018-09-08 ENCOUNTER — Inpatient Hospital Stay: Payer: HMO | Attending: Medical

## 2018-09-08 ENCOUNTER — Encounter: Payer: Self-pay | Admitting: Hematology

## 2018-09-08 VITALS — BP 133/59 | HR 70 | Temp 98.0°F | Resp 18 | Ht 62.0 in | Wt 234.0 lb

## 2018-09-08 DIAGNOSIS — I10 Essential (primary) hypertension: Secondary | ICD-10-CM | POA: Insufficient documentation

## 2018-09-08 DIAGNOSIS — I2510 Atherosclerotic heart disease of native coronary artery without angina pectoris: Secondary | ICD-10-CM | POA: Insufficient documentation

## 2018-09-08 DIAGNOSIS — Z79899 Other long term (current) drug therapy: Secondary | ICD-10-CM | POA: Insufficient documentation

## 2018-09-08 DIAGNOSIS — Z794 Long term (current) use of insulin: Secondary | ICD-10-CM

## 2018-09-08 DIAGNOSIS — R110 Nausea: Secondary | ICD-10-CM | POA: Insufficient documentation

## 2018-09-08 DIAGNOSIS — F1010 Alcohol abuse, uncomplicated: Secondary | ICD-10-CM | POA: Insufficient documentation

## 2018-09-08 DIAGNOSIS — J449 Chronic obstructive pulmonary disease, unspecified: Secondary | ICD-10-CM | POA: Insufficient documentation

## 2018-09-08 DIAGNOSIS — I700 Atherosclerosis of aorta: Secondary | ICD-10-CM | POA: Diagnosis not present

## 2018-09-08 DIAGNOSIS — K8020 Calculus of gallbladder without cholecystitis without obstruction: Secondary | ICD-10-CM | POA: Insufficient documentation

## 2018-09-08 DIAGNOSIS — K921 Melena: Secondary | ICD-10-CM | POA: Insufficient documentation

## 2018-09-08 DIAGNOSIS — F419 Anxiety disorder, unspecified: Secondary | ICD-10-CM | POA: Insufficient documentation

## 2018-09-08 DIAGNOSIS — I509 Heart failure, unspecified: Secondary | ICD-10-CM

## 2018-09-08 DIAGNOSIS — Z87891 Personal history of nicotine dependence: Secondary | ICD-10-CM | POA: Diagnosis not present

## 2018-09-08 DIAGNOSIS — D539 Nutritional anemia, unspecified: Secondary | ICD-10-CM

## 2018-09-08 DIAGNOSIS — E1140 Type 2 diabetes mellitus with diabetic neuropathy, unspecified: Secondary | ICD-10-CM

## 2018-09-08 DIAGNOSIS — I252 Old myocardial infarction: Secondary | ICD-10-CM | POA: Insufficient documentation

## 2018-09-08 DIAGNOSIS — D696 Thrombocytopenia, unspecified: Secondary | ICD-10-CM

## 2018-09-08 DIAGNOSIS — E1151 Type 2 diabetes mellitus with diabetic peripheral angiopathy without gangrene: Secondary | ICD-10-CM | POA: Diagnosis not present

## 2018-09-08 DIAGNOSIS — E669 Obesity, unspecified: Secondary | ICD-10-CM | POA: Insufficient documentation

## 2018-09-08 DIAGNOSIS — I482 Chronic atrial fibrillation, unspecified: Secondary | ICD-10-CM | POA: Diagnosis not present

## 2018-09-08 DIAGNOSIS — E785 Hyperlipidemia, unspecified: Secondary | ICD-10-CM

## 2018-09-08 LAB — CMP (CANCER CENTER ONLY)
ALT: 23 U/L (ref 0–44)
AST: 39 U/L (ref 15–41)
Albumin: 3.9 g/dL (ref 3.5–5.0)
Alkaline Phosphatase: 140 U/L — ABNORMAL HIGH (ref 38–126)
Anion gap: 10 (ref 5–15)
BUN: 22 mg/dL (ref 8–23)
CO2: 27 mmol/L (ref 22–32)
Calcium: 8.6 mg/dL — ABNORMAL LOW (ref 8.9–10.3)
Chloride: 99 mmol/L (ref 98–111)
Creatinine: 1.03 mg/dL — ABNORMAL HIGH (ref 0.44–1.00)
GFR, Est AFR Am: >60 mL/min (ref >60)
GFR, Estimated: 55 mL/min — ABNORMAL LOW (ref >60)
Glucose, Bld: 207 mg/dL — ABNORMAL HIGH (ref 70–99)
Potassium: 3.6 mmol/L (ref 3.5–5.1)
Sodium: 136 mmol/L (ref 135–145)
Total Bilirubin: 0.7 mg/dL (ref 0.3–1.2)
Total Protein: 7.1 g/dL (ref 6.5–8.1)

## 2018-09-08 LAB — CBC WITH DIFFERENTIAL (CANCER CENTER ONLY)
Abs Immature Granulocytes: 0.02 10*3/uL (ref 0.00–0.07)
Basophils Absolute: 0 10*3/uL (ref 0.0–0.1)
Basophils Relative: 1 %
Eosinophils Absolute: 0.1 10*3/uL (ref 0.0–0.5)
Eosinophils Relative: 1 %
HCT: 27.9 % — ABNORMAL LOW (ref 36.0–46.0)
Hemoglobin: 8.5 g/dL — ABNORMAL LOW (ref 12.0–15.0)
Immature Granulocytes: 1 %
Lymphocytes Relative: 18 %
Lymphs Abs: 0.7 10*3/uL (ref 0.7–4.0)
MCH: 32 pg (ref 26.0–34.0)
MCHC: 30.5 g/dL (ref 30.0–36.0)
MCV: 104.9 fL — ABNORMAL HIGH (ref 80.0–100.0)
Monocytes Absolute: 0.4 10*3/uL (ref 0.1–1.0)
Monocytes Relative: 9 %
Neutro Abs: 2.7 10*3/uL (ref 1.7–7.7)
Neutrophils Relative %: 70 %
Platelet Count: 73 10*3/uL — ABNORMAL LOW (ref 150–400)
RBC: 2.66 MIL/uL — ABNORMAL LOW (ref 3.87–5.11)
RDW: 15.9 % — ABNORMAL HIGH (ref 11.5–15.5)
WBC Count: 3.8 10*3/uL — ABNORMAL LOW (ref 4.0–10.5)
nRBC: 0 % (ref 0.0–0.2)

## 2018-09-08 LAB — VITAMIN B12: Vitamin B-12: 629 pg/mL (ref 180–914)

## 2018-09-08 LAB — SAVE SMEAR(SSMR), FOR PROVIDER SLIDE REVIEW

## 2018-09-08 LAB — PROTIME-INR
INR: 1.2 (ref 0.8–1.2)
Prothrombin Time: 14.8 seconds (ref 11.4–15.2)

## 2018-09-08 LAB — APTT: aPTT: 26 seconds (ref 24–36)

## 2018-09-08 LAB — FOLATE: Folate: 54 ng/mL (ref >5.9)

## 2018-09-08 NOTE — Telephone Encounter (Signed)
Alexis [name] called stating she could not come at 10:20. Pt is scheduled for Monday 7/13 11:00am.

## 2018-09-09 ENCOUNTER — Emergency Department (HOSPITAL_BASED_OUTPATIENT_CLINIC_OR_DEPARTMENT_OTHER): Payer: HMO

## 2018-09-09 ENCOUNTER — Encounter (HOSPITAL_BASED_OUTPATIENT_CLINIC_OR_DEPARTMENT_OTHER): Payer: Self-pay

## 2018-09-09 ENCOUNTER — Other Ambulatory Visit: Payer: Self-pay

## 2018-09-09 ENCOUNTER — Emergency Department (HOSPITAL_BASED_OUTPATIENT_CLINIC_OR_DEPARTMENT_OTHER)
Admission: EM | Admit: 2018-09-09 | Discharge: 2018-09-09 | Disposition: A | Discharge status: Home / Self Care | Payer: HMO | Attending: Emergency Medicine | Admitting: Emergency Medicine

## 2018-09-09 DIAGNOSIS — Z794 Long term (current) use of insulin: Secondary | ICD-10-CM | POA: Insufficient documentation

## 2018-09-09 DIAGNOSIS — I110 Hypertensive heart disease with heart failure: Secondary | ICD-10-CM | POA: Insufficient documentation

## 2018-09-09 DIAGNOSIS — Z79899 Other long term (current) drug therapy: Secondary | ICD-10-CM | POA: Insufficient documentation

## 2018-09-09 DIAGNOSIS — J441 Chronic obstructive pulmonary disease with (acute) exacerbation: Secondary | ICD-10-CM | POA: Diagnosis not present

## 2018-09-09 DIAGNOSIS — Z87891 Personal history of nicotine dependence: Secondary | ICD-10-CM | POA: Diagnosis not present

## 2018-09-09 DIAGNOSIS — Z950 Presence of cardiac pacemaker: Secondary | ICD-10-CM | POA: Insufficient documentation

## 2018-09-09 DIAGNOSIS — E119 Type 2 diabetes mellitus without complications: Secondary | ICD-10-CM | POA: Diagnosis not present

## 2018-09-09 DIAGNOSIS — Z96652 Presence of left artificial knee joint: Secondary | ICD-10-CM | POA: Insufficient documentation

## 2018-09-09 DIAGNOSIS — I509 Heart failure, unspecified: Secondary | ICD-10-CM | POA: Diagnosis not present

## 2018-09-09 DIAGNOSIS — R0602 Shortness of breath: Secondary | ICD-10-CM | POA: Diagnosis not present

## 2018-09-09 LAB — CBC WITH DIFFERENTIAL/PLATELET
Abs Immature Granulocytes: 0.02 10*3/uL (ref 0.00–0.07)
Basophils Absolute: 0 10*3/uL (ref 0.0–0.1)
Basophils Relative: 1 %
Eosinophils Absolute: 0 10*3/uL (ref 0.0–0.5)
Eosinophils Relative: 1 %
HCT: 29.1 % — ABNORMAL LOW (ref 36.0–46.0)
Hemoglobin: 8.9 g/dL — ABNORMAL LOW (ref 12.0–15.0)
Immature Granulocytes: 1 %
Lymphocytes Relative: 20 %
Lymphs Abs: 0.8 10*3/uL (ref 0.7–4.0)
MCH: 31.9 pg (ref 26.0–34.0)
MCHC: 30.6 g/dL (ref 30.0–36.0)
MCV: 104.3 fL — ABNORMAL HIGH (ref 80.0–100.0)
Monocytes Absolute: 0.5 10*3/uL (ref 0.1–1.0)
Monocytes Relative: 13 %
Neutro Abs: 2.4 10*3/uL (ref 1.7–7.7)
Neutrophils Relative %: 64 %
Platelets: 69 10*3/uL — ABNORMAL LOW (ref 150–400)
RBC: 2.79 MIL/uL — ABNORMAL LOW (ref 3.87–5.11)
RDW: 15.8 % — ABNORMAL HIGH (ref 11.5–15.5)
WBC: 3.8 10*3/uL — ABNORMAL LOW (ref 4.0–10.5)
nRBC: 0 % (ref 0.0–0.2)

## 2018-09-09 LAB — BASIC METABOLIC PANEL
Anion gap: 10 (ref 5–15)
BUN: 29 mg/dL — ABNORMAL HIGH (ref 8–23)
CO2: 26 mmol/L (ref 22–32)
Calcium: 9.1 mg/dL (ref 8.9–10.3)
Chloride: 102 mmol/L (ref 98–111)
Creatinine, Ser: 0.89 mg/dL (ref 0.44–1.00)
GFR calc Af Amer: >60 mL/min (ref >60)
GFR calc non Af Amer: >60 mL/min (ref >60)
Glucose, Bld: 136 mg/dL — ABNORMAL HIGH (ref 70–99)
Potassium: 3.7 mmol/L (ref 3.5–5.1)
Sodium: 138 mmol/L (ref 135–145)

## 2018-09-09 LAB — HEPATITIS B CORE ANTIBODY, TOTAL: Hep B Core Total Ab: NEGATIVE

## 2018-09-09 LAB — HIV ANTIBODY (ROUTINE TESTING W REFLEX): HIV Screen 4th Generation wRfx: NONREACTIVE

## 2018-09-09 LAB — TROPONIN I (HIGH SENSITIVITY)
Troponin I (High Sensitivity): 15 ng/L (ref <18)
Troponin I (High Sensitivity): 14 ng/L (ref <18)

## 2018-09-09 LAB — HEPATITIS B SURFACE ANTIBODY,QUALITATIVE: Hep B S Ab: NONREACTIVE

## 2018-09-09 LAB — BRAIN NATRIURETIC PEPTIDE: B Natriuretic Peptide: 133.5 pg/mL — ABNORMAL HIGH (ref 0.0–100.0)

## 2018-09-09 LAB — HEPATITIS B SURFACE ANTIGEN: Hepatitis B Surface Ag: NEGATIVE

## 2018-09-09 MED ORDER — FUROSEMIDE 40 MG PO TABS: 20.0000 mg | ORAL_TABLET | Freq: Two times a day (BID) | ORAL | 0 refills | Status: DC

## 2018-09-09 MED ORDER — FUROSEMIDE 10 MG/ML IJ SOLN
40.0000 mg | Freq: Once | INTRAMUSCULAR | Status: AC
  Administered 2018-09-09: 40 mg via INTRAVENOUS
  Filled 2018-09-09: qty 4

## 2018-09-09 NOTE — ED Provider Notes (Signed)
[facility] EMERGENCY DEPARTMENT Provider Note   CSN: [id number] Arrival date & time: 09/09/18  1442     History   Chief Complaint Chief Complaint  Patient presents with  . Shortness of Breath    HPI Alexis [name] is a 68 y.o. female.     Patient is a 68 year old female with extensive past medical history including CHF, COPD, diabetes, hypertension, pacemaker, peripheral vascular disease.  She presents today for evaluation of dyspnea.  This is been worsening over the past week.  She was seen yesterday by her primary doctor, but does not feel as though oral torsemide is helping.  She describes dyspnea on exertion and difficulty sleeping secondary to her breathing.  She was also seen here 1 week ago and underwent CT scan and additional testing.  This showed no evidence for PE, but did show evidence for fluid.  The history is provided by the patient.  Shortness of Breath Severity:  Moderate Onset quality:  Gradual Duration:  1 week Timing:  Constant Progression:  Worsening Chronicity:  Recurrent Context: activity   Relieved by:  Nothing Worsened by:  Nothing Ineffective treatments:  None tried   Past Medical History:  Diagnosis Date  . Allergy   . Anxiety   . Arthritis   . Asthma   . Atrial fibrillation ([place])   . CHF (congestive heart failure) ([place])   . COPD (chronic obstructive pulmonary disease) ([place])   . Diabetes mellitus without complication ([place])   . Hyperlipidemia   . Hypertension   . Macrocytic anemia 09/04/2018  . Myocardial infarction ([place])    several  . Pacemaker    CRT-P with RV lead and His Bundle lead ( high threshold)   . Peripheral neuropathy   . Pneumonia   . PONV (postoperative nausea and vomiting)    unsure of exactly what happened at [facility] in 2010 (knee surgery) that led to crital care  . Thyroid disease     Patient Active Problem List   Diagnosis Date Noted  . Macrocytic anemia 09/04/2018  . Pacemaker 08/25/2018   . Insulin dependent diabetes mellitus with complications ([place]) 38/46/6599  . Constipation 08/25/2018  . Fluid overload 08/25/2018  . Chronic anticoagulation 08/25/2018  . Morbid obesity ([place]) 08/25/2018  . Chronic diastolic heart failure ([place]) 05/17/2018  . Acute bronchitis with COPD ([place]) 01/16/2018  . Acquired absence of other right toe(s) ([place]) 11/16/2017  . Diabetic ulcer of toe of right foot associated with type 2 diabetes mellitus ([place]) 11/16/2017  . Crush injury of left foot, initial encounter 11/28/2016  . Osteomyelitis ([place]) 07/30/2016  . Gangrene of foot ([place]) 04/27/2016  . Thrombocytopenia ([place])   . Vomiting and diarrhea 04/05/2016  . Hyperlipidemia 11/18/2015  . Gout 08/05/2014  . Cough 08/05/2014  . Allergic rhinitis 07/12/2014  . Osteoarthritis 07/12/2014  . Asthma 07/12/2014  . Permanent atrial fibrillation 07/12/2014  . HTN (hypertension) 07/12/2014  . Hypothyroidism 07/12/2014  . History of substance abuse ([place]) 07/12/2014  . Peripheral vascular disease ([place]) 04/02/2013  . Discoloration of skin 04/02/2013    Past Surgical History:  Procedure Laterality Date  . ABDOMINAL AORTOGRAM W/LOWER EXTREMITY N/A 04/29/2016   Procedure: Abdominal Aortogram w/Lower Extremity;  Surgeon: Waynetta Sandy, MD;  Location: [facility];  Service: Cardiovascular;  Laterality: N/A;  . ABDOMINAL AORTOGRAM W/LOWER EXTREMITY N/A 05/06/2017   Procedure: ABDOMINAL AORTOGRAM W/LOWER EXTREMITY;  Surgeon: Angelia Mould, MD;  Location: [facility];  Service: Cardiovascular;  Laterality: N/A;  bilateral  . ABDOMINAL HYSTERECTOMY    . AMPUTATION Right 07/30/2016   Procedure: AMPUTATION RIGHT SECOND TOE;  Surgeon: Angelia Mould, MD;  Location: [facility];  Service: Vascular;  Laterality: Right;  . CARDIAC CATHETERIZATION     2005 at [facility]  . PERIPHERAL VASCULAR ATHERECTOMY Right 04/29/2016   Procedure: Peripheral Vascular Atherectomy-Right Popliteal;   Surgeon: Waynetta Sandy, MD;  Location: [facility];  Service: Cardiovascular;  Laterality: Right;  . PERIPHERAL VASCULAR INTERVENTION Right 04/29/2016   Procedure: Peripheral Vascular Intervention-Right Popliteal;  Surgeon: Waynetta Sandy, MD;  Location: [facility];  Service: Cardiovascular;  Laterality: Right;  POPLITEAL PTA  . RADIOFREQUENCY ABLATION    . TOTAL KNEE ARTHROPLASTY    . TUBAL LIGATION       OB History   No obstetric history on file.      Home Medications    Prior to Admission medications   Medication Sig Start Date End Date Taking? Authorizing Provider  albuterol (PROVENTIL HFA;VENTOLIN HFA) 108 (90 Base) MCG/ACT inhaler Inhale 2 puffs into the lungs every 6 (six) hours as needed for wheezing or shortness of breath. 03/15/17   Saguier, Percell Miller, PA-C  allopurinol (ZYLOPRIM) 100 MG tablet TAKE ONE TABLET BY MOUTH TWICE DAILY 08/07/18   Saguier, Percell Miller, PA-C  AMBULATORY NON [place] scooter.  Diagnosis: Osteoarthritis M19.90 09/10/16   Carollee Herter, Alferd Apa, DO  Continuous Blood Gluc Sensor (FREESTYLE LIBRE 14 DAY SENSOR) MISC  05/20/18   [provider]  DULoxetine (CYMBALTA) 30 MG capsule TAKE ONE CAPSULE BY MOUTH EVERY NIGHT AT BEDTIME 05/16/18   Saguier, Percell Miller, PA-C  fluticasone [facility]) 50 MCG/ACT nasal spray Place 2 sprays into both nostrils at bedtime. Patient taking differently: Place 2 sprays into both nostrils as needed.  07/31/16   Alvia Grove, PA-C  fluticasone (FLOVENT HFA) 110 MCG/ACT inhaler Inhale 2 puffs into the lungs 2 (two) times daily as needed (bronchitis). 07/31/16   Alvia Grove, PA-C  Insulin Disposable Pump (V-GO 40) KIT Inject into the skin See admin instructions. Use with Novolog - 6 pumps before each meal 11/21/15   [provider]  Levothyroxine Sodium 150 MCG CAPS Take 1 capsule (150 mcg total) by mouth daily before breakfast. 09/20/17   Saguier, Percell Miller, PA-C  metoprolol  succinate (TOPROL-XL) 25 MG 24 hr tablet TAKE 1 TABLET BY MOUTH DAILY 05/16/18   Saguier, Percell Miller, PA-C  NOVOLOG 100 UNIT/ML injection 100 units per day in VGO unknown bolus 06/23/16   [provider]  pantoprazole (PROTONIX) 40 MG tablet Take 1 tablet (40 mg total) by mouth 2 (two) times daily. 08/25/18   Erlene Quan, PA-C  Prenatal Vit-Fe Fumarate-FA (MULTIVITAMIN-PRENATAL) 27-0.8 MG TABS tablet Take 1 tablet by mouth daily. Takes for nail and hair growth     [provider]  psyllium (METAMUCIL SMOOTH TEXTURE) 58.6 % powder Take 1 packet by mouth daily. 08/25/18   Erlene Quan, PA-C  Semaglutide 0.25 or 0.5 MG/DOSE SOPN Inject 0.12m weekly 05/30/17   [provider]  torsemide (DEMADEX) 10 MG tablet Take 1 tablet (10 mg total) by mouth daily. 06/13/18   Saguier, EPercell Miller PA-C    Family History Family History  Problem Relation Age of Onset  . Diabetes Mother   . Hyperlipidemia Mother   . Diabetes Father   . Hyperlipidemia Father   . Heart disease Father        before age 70 .  Heart attack Father   . Diabetes Brother   . Hyperlipidemia Brother   . Heart attack Brother     Social History Social History   Tobacco Use  . Smoking status: Former Smoker    Quit date: 03/01/1998    Years since quitting: 20.5  . Smokeless tobacco: Never Used  Substance Use Topics  . 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  . Drug use: Yes    Types: Marijuana    Comment: remote use     Allergies   Indomethacin, Pregabalin, Sulfa antibiotics, and Morphine and related   Review of Systems Review of Systems  Respiratory: Positive for shortness of breath.   All other systems reviewed and are negative.    Physical Exam Updated Vital Signs Ht _0  (1.575 m)   Wt 106.1 kg   BMI 42.80 kg/m   Physical Exam Vitals signs and nursing note reviewed.  Constitutional:      General: She is not  in acute distress.    Appearance: She is well-developed. She is not diaphoretic.  HENT:     Head: Normocephalic and atraumatic.  Neck:     Musculoskeletal: Normal range of motion and neck supple.  Cardiovascular:     Rate and Rhythm: Normal rate and regular rhythm.     Heart sounds: No murmur. No friction rub. No gallop.   Pulmonary:     Effort: Pulmonary effort is normal. No respiratory distress.     Breath sounds: Normal breath sounds. No wheezing.  Abdominal:     General: Bowel sounds are normal. There is no distension.     Palpations: Abdomen is soft.     Tenderness: There is no abdominal tenderness.  Musculoskeletal: Normal range of motion.     Right lower leg: She exhibits no tenderness. No edema.     Left lower leg: She exhibits no tenderness. No edema.  Skin:    General: Skin is warm and dry.  Neurological:     Mental Status: She is alert and oriented to person, place, and time.      ED Treatments / Results  Labs (all labs ordered are listed, but only abnormal results are displayed) Labs Reviewed  BASIC METABOLIC PANEL  CBC WITH DIFFERENTIAL/PLATELET  BRAIN NATRIURETIC PEPTIDE  TROPONIN I (HIGH SENSITIVITY)    EKG EKG Interpretation  Date/Time:  Saturday September 09 2018 14:58:23 EDT Ventricular Rate:  70 PR Interval:    QRS Duration: 98 QT Interval:  370 QTC Calculation: 400 R Axis:   109 Text Interpretation:  Atrial fibrillation Right axis deviation Low voltage, precordial leads Nonspecific repol abnormality, diffuse leads Baseline wander in lead(s) II III aVF V2 Confirmed by Veryl Speak [id number]) on 09/09/2018 3:02:45 PM   Radiology No results found.  Procedures Procedures (including critical care time)  Medications Ordered in ED Medications  furosemide (LASIX) injection 40 mg (has no administration in time range)     Initial Impression / Assessment and Plan / ED Course  I have reviewed the triage vital signs and the nursing notes.  Pertinent  labs & imaging results that were available during my care of the patient were reviewed by me and considered in my medical decision making (see chart for details).  Patient presenting with complaints of shortness of breath and weight gain.  She has a history of congestive heart failure.  Her symptoms appear to be related to  this.  Her BNP is mildly elevated above baseline and laboratory studies are otherwise unremarkable.  Patient does take torsemide.  She will be given an additional 3-day course of Lasix to take in addition to this.  Patient is not hypoxic and work-up reveals no evidence for an acute coronary event.  Troponin x2 is negative and EKG is unchanged.  Her oxygen saturations are 99 to 100% on room air and I feel as though discharge is appropriate.  Final Clinical Impressions(s) / ED Diagnoses   Final diagnoses:  None    ED Discharge Orders    None       Veryl Speak, MD 09/09/18 1816

## 2018-09-09 NOTE — ED Triage Notes (Signed)
Pt presents with SOB x 3 days- pt reports taking lasix as prescribed. Pt SpO2 100% on RA

## 2018-09-09 NOTE — Discharge Instructions (Addendum)
Begin taking Lasix 20 mg twice daily as needed for fluid retention/difficulty breathing.  Return to the emergency department if you develop severe chest pain, high fever, worsening breathing, or other new and concerning symptoms.

## 2018-09-09 NOTE — ED Notes (Signed)
IV blown to [place]. Pt stable at this time- holding on IV access

## 2018-09-09 NOTE — ED Notes (Signed)
Void x 3 - unable to measure. Pt also reports BM

## 2018-09-09 NOTE — ED Notes (Signed)
Attempted IV to RAC, tol well, blood obtained for labs

## 2018-09-09 NOTE — ED Notes (Signed)
ED Provider at bedside. 

## 2018-09-11 ENCOUNTER — Other Ambulatory Visit: Payer: Self-pay

## 2018-09-11 ENCOUNTER — Ambulatory Visit (INDEPENDENT_AMBULATORY_CARE_PROVIDER_SITE_OTHER): Payer: HMO | Admitting: Medical

## 2018-09-11 ENCOUNTER — Telehealth: Payer: Self-pay | Admitting: *Deleted

## 2018-09-11 ENCOUNTER — Telehealth: Payer: HMO | Admitting: Gastroenterology

## 2018-09-11 ENCOUNTER — Encounter: Payer: Self-pay | Admitting: Medical

## 2018-09-11 ENCOUNTER — Other Ambulatory Visit: Payer: Self-pay | Admitting: Medical

## 2018-09-11 VITALS — BP 143/70 | HR 73 | Temp 98.6°F | Resp 16 | Ht 62.0 in | Wt 229.8 lb

## 2018-09-11 DIAGNOSIS — R252 Cramp and spasm: Secondary | ICD-10-CM

## 2018-09-11 DIAGNOSIS — I509 Heart failure, unspecified: Secondary | ICD-10-CM | POA: Diagnosis not present

## 2018-09-11 LAB — LACTATE DEHYDROGENASE: LDH: 219 U/L — ABNORMAL HIGH (ref 98–192)

## 2018-09-11 LAB — IRON AND TIBC
Iron: 60 ug/dL (ref 41–142)
Saturation Ratios: 13 % — ABNORMAL LOW (ref 21–57)
TIBC: 476 ug/dL — ABNORMAL HIGH (ref 236–444)
UIBC: 417 ug/dL — ABNORMAL HIGH (ref 120–384)

## 2018-09-11 LAB — SOLUBLE TRANSFERRIN RECEPTOR: Transferrin Receptor: 57.2 nmol/L — ABNORMAL HIGH (ref 12.2–27.3)

## 2018-09-11 LAB — FERRITIN: Ferritin: 33 ng/mL (ref 11–307)

## 2018-09-11 NOTE — Telephone Encounter (Signed)
-----   Message from Tish Men, MD sent at 09/11/2018 11:43 AM EDT ----- Alexis [name],  Can you let Alexis [name] know that her iron level is low, and she should pick up an OTC iron supplement (any brand is fine)? She should only take 1 tab once a day.   She needs to see GI as soon as possible to rule out any occult GI bleeding, given the unexplained iron deficiency.  Thanks.  [place]  ----- Message ----- From: Buel Ream, Lab [facility] [place] Sent: 09/08/2018  11:59 AM EDT To: Tish Men, MD

## 2018-09-11 NOTE — Patient Instructions (Signed)
Pt chf flare seems improved. 5 lbs less than in ED and o2 sat is 98%. Continue current regimen presently. Check weight daily. If recurrent weight gain, pedal edema or shortness of breath let us know.  For low platelets follow hematologist recommendation pending work up results.  Will follow GI reports after you see him today. Explain gi md that you did have black stools.  For diabetes continue current regimen endocrinologist advised.  Follow up date to be determined after review of specialist notes. One month at least or sooner if needed

## 2018-09-11 NOTE — Progress Notes (Addendum)
Subjective:    Patient ID: Alexis [name], female    DOB: 1950/07/24, 68 y.o.   MRN: [id number]  HPI Pt has follow up from ED.   This Hpi from that below.  Patient is a 68 year old female with extensive past medical history including CHF, COPD, diabetes, hypertension, pacemaker, peripheral vascular disease.  She presents today for evaluation of dyspnea.  This is been worsening over the past week.  She was seen yesterday by her primary doctor, but does not feel as though oral torsemide is helping.  She describes dyspnea on exertion and difficulty sleeping secondary to her breathing.  She was also seen here 1 week ago and underwent CT scan and additional testing.  This showed no evidence for PE, but did show evidence for fluid.  The history is provided by the patient.  Shortness of Breath Severity:  Moderate Onset quality:  Gradual Duration:  1 week Timing:  Constant Progression:  Worsening Chronicity:  Recurrent Context: activity   Relieved by:  Nothing Worsened by:  Nothing Ineffective treatments:  None tried      A and P from ED 2 days ago. Pertinent labs & imaging results that were available during my care of the patient were reviewed by me and considered in my medical decision making (see chart for details).  Patient presenting with complaints of shortness of breath and weight gain.  She has a history of congestive heart failure.  Her symptoms appear to be related to this.  Her BNP is mildly elevated above baseline and laboratory studies are otherwise unremarkable.  Patient does take torsemide.  She will be given an additional 3-day course of Lasix to take in addition to this.  Patient is not hypoxic and work-up reveals no evidence for an acute coronary event.  Troponin x2 is negative and EKG is unchanged.  Her oxygen saturations are 99 to 100% on room air and I feel as though discharge is appropriate.   Pt was given lasix 40 mg im injection and states her breathing  improved significantly. Since then feels a lot better. No recurrent dyspnea and did not regain weight.   Pt has seen hematologist for anemia and thrombocytopenia. Labs done.  Later today today she will have video visit with GI for anemia.  Saw endocrine about 2 weeks.   Review of Systems  Constitutional: Negative for chills, fatigue and fever.  Respiratory: Negative for cough, chest tightness, shortness of breath and wheezing.   Cardiovascular: Negative for chest pain and palpitations.  Gastrointestinal: Negative for abdominal pain, blood in stool, diarrhea and vomiting.  Genitourinary: Negative for difficulty urinating, dysuria, flank pain and frequency.  Musculoskeletal: Negative for back pain, joint swelling and neck stiffness.  Skin: Negative for rash.  Neurological: Negative for dizziness, speech difficulty, weakness and numbness.  Hematological: Negative for adenopathy. Does not bruise/bleed easily.  Psychiatric/Behavioral: Negative for behavioral problems, confusion, sleep disturbance and suicidal ideas. The patient is not nervous/anxious.    Past Medical History:  Diagnosis Date  . Allergy   . Anxiety   . Arthritis   . Asthma   . Atrial fibrillation ([place])   . CHF (congestive heart failure) ([place])   . COPD (chronic obstructive pulmonary disease) ([place])   . Diabetes mellitus without complication ([place])   . Hyperlipidemia   . Hypertension   . Macrocytic anemia 09/04/2018  . Myocardial infarction ([place])    several  . Pacemaker    CRT-P with RV lead and His Bundle lead ( high  threshold)   . Peripheral neuropathy   . Pneumonia   . PONV (postoperative nausea and vomiting)    unsure of exactly what happened at [facility] in 2010 (knee surgery) that led to crital care  . Thyroid disease      Social History   Socioeconomic History  . Marital status: Married    Spouse name: Not on file  . Number of children: Not on file  . Years of education: Not on file  .  Highest education level: Not on file  Occupational History  . Occupation: retired  Scientific laboratory technician  . Financial resource strain: Not on file  . Food insecurity    Worry: Not on file    Inability: Not on file  . Transportation needs    Medical: Not on file    Non-medical: Not on file  Tobacco Use  . Smoking status: Former Smoker    Quit date: 03/01/1998    Years since quitting: 20.5  . Smokeless tobacco: Never Used  Substance and Sexual Activity  . 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  . Drug use: Yes    Types: Marijuana    Comment: remote use  . Sexual activity: Never  Lifestyle  . Physical activity    Days per week: Not on file    Minutes per session: Not on file  . Stress: Not on file  Relationships  . Social Herbalist on phone: Not on file    Gets together: Not on file    Attends religious service: Not on file    Active member of club or organization: Not on file    Attends meetings of clubs or organizations: Not on file    Relationship status: Not on file  . Intimate partner violence    Fear of current or ex partner: Not on file    Emotionally abused: Not on file    Physically abused: Not on file    Forced sexual activity: Not on file  Other Topics Concern  . Not on file  Social History Narrative  . Not on file    Past Surgical History:  Procedure Laterality Date  . ABDOMINAL AORTOGRAM W/LOWER EXTREMITY N/A 04/29/2016   Procedure: Abdominal Aortogram w/Lower Extremity;  Surgeon: Waynetta Sandy, MD;  Location: [facility];  Service: Cardiovascular;  Laterality: N/A;  . ABDOMINAL AORTOGRAM W/LOWER EXTREMITY N/A 05/06/2017   Procedure: ABDOMINAL AORTOGRAM W/LOWER EXTREMITY;  Surgeon: Angelia Mould, MD;  Location: [facility];  Service: Cardiovascular;  Laterality: N/A;  bilateral  . ABDOMINAL HYSTERECTOMY    . AMPUTATION Right 07/30/2016    Procedure: AMPUTATION RIGHT SECOND TOE;  Surgeon: Angelia Mould, MD;  Location: [facility];  Service: Vascular;  Laterality: Right;  . CARDIAC CATHETERIZATION     2005 at [facility]  . PERIPHERAL VASCULAR ATHERECTOMY Right 04/29/2016   Procedure: Peripheral Vascular Atherectomy-Right Popliteal;  Surgeon: Waynetta Sandy, MD;  Location: [facility];  Service: Cardiovascular;  Laterality: Right;  . PERIPHERAL VASCULAR INTERVENTION Right 04/29/2016   Procedure: Peripheral Vascular Intervention-Right Popliteal;  Surgeon: Waynetta Sandy, MD;  Location: [facility];  Service: Cardiovascular;  Laterality: Right;  POPLITEAL PTA  . RADIOFREQUENCY ABLATION    . TOTAL KNEE ARTHROPLASTY    . TUBAL LIGATION      Family History  Problem Relation Age  of Onset  . Diabetes Mother   . Hyperlipidemia Mother   . Diabetes Father   . Hyperlipidemia Father   . Heart disease Father        before age 50  . Heart attack Father   . Diabetes Brother   . Hyperlipidemia Brother   . Heart attack Brother     Allergies  Allergen Reactions  . Indomethacin Other (See Comments)     Renal Insufficiency  . Pregabalin Other (See Comments)    DIZZINESS   . Sulfa Antibiotics Hives  . Morphine And Related Nausea And Vomiting    Current Outpatient Medications on File Prior to Visit  Medication Sig Dispense Refill  . albuterol (PROVENTIL HFA;VENTOLIN HFA) 108 (90 Base) MCG/ACT inhaler Inhale 2 puffs into the lungs every 6 (six) hours as needed for wheezing or shortness of breath. 1 Inhaler 2  . allopurinol (ZYLOPRIM) 100 MG tablet TAKE ONE TABLET BY MOUTH TWICE DAILY 60 tablet 3  . AMBULATORY NON FORMULARY MEDICATION Motorized scooter.  Diagnosis: Osteoarthritis M19.90 1 each 0  . Continuous Blood Gluc Sensor (FREESTYLE LIBRE 14 DAY SENSOR) MISC     . DULoxetine (CYMBALTA) 30 MG capsule TAKE ONE CAPSULE BY MOUTH EVERY NIGHT AT BEDTIME 90 capsule 1  . fluticasone (FLONASE)  50 MCG/ACT nasal spray Place 2 sprays into both nostrils at bedtime. (Patient taking differently: Place 2 sprays into both nostrils as needed. )    . fluticasone (FLOVENT HFA) 110 MCG/ACT inhaler Inhale 2 puffs into the lungs 2 (two) times daily as needed (bronchitis).    . furosemide (LASIX) 40 MG tablet Take 0.5 tablets (20 mg total) by mouth 2 (two) times daily. 10 tablet 0  . Insulin Disposable Pump (V-GO 40) KIT Inject into the skin See admin instructions. Use with Novolog - 6 pumps before each meal    . Levothyroxine Sodium 150 MCG CAPS Take 1 capsule (150 mcg total) by mouth daily before breakfast. 30 capsule 3  . NOVOLOG 100 UNIT/ML injection 100 units per day in VGO unknown bolus    . pantoprazole (PROTONIX) 40 MG tablet Take 1 tablet (40 mg total) by mouth 2 (two) times daily. 60 tablet 4  . Prenatal Vit-Fe Fumarate-FA (MULTIVITAMIN-PRENATAL) 27-0.8 MG TABS tablet Take 1 tablet by mouth daily. Takes for nail and hair growth     . psyllium (METAMUCIL SMOOTH TEXTURE) 58.6 % powder Take 1 packet by mouth daily. 283 g 12  . Semaglutide 0.25 or 0.5 MG/DOSE SOPN Inject 0.8m weekly     No current facility-administered medications on file prior to visit.     BP (!) 143/70   Pulse 73   Temp 98.6 F (37 C) (Oral)   Resp 16   Ht 5' 2"  (1.575 m)   Wt 229 lb 12.8 oz (104.2 kg)   SpO2 98%   BMI 42.03 kg/m       Objective:   Physical Exam  General Mental Status- Alert. General Appearance- Not in acute distress.   Skin General: Color- Normal Color. Moisture- Normal Moisture.  Neck Carotid Arteries- Normal color. Moisture- Normal Moisture. No carotid bruits. No JVD.  Chest and Lung Exam Auscultation: Breath Sounds:-Normal.  Cardiovascular Auscultation:Rythm- Regular. Murmurs & Other Heart Sounds:Auscultation of the heart reveals- No Murmurs.  Abdomen Inspection:-Inspeection Normal. Palpation/Percussion:Note:No mass. Palpation and Percussion of the abdomen reveal- Non  Tender, Non Distended + BS, no rebound or guarding.    Neurologic Cranial Nerve exam:- CN III-XII intact(No nystagmus), symmetric smile. Strength:- 5/5  equal and symmetric strength both upper and lower extremities.  Lower ext- no pedal edema. Negative homans sign . Symmetric calfs.     Assessment & Plan:  Pt chf flare seems improved. 5 lbs less than in ED and o2 sat is 98%. Continue current regimen presently. Check weight daily. If recurrent weight gain, pedal edema or shortness of breath let us know.  For low platelets follow hematologist recommendation pending work up results.  Will follow GI reports after you see him today. Explain gi md that you did have black stools.  For diabetes continue current regimen endocrinologist advised.  Follow up date to be determined after review of specialist notes. One month at least or sooner if needed  25 minutes spent with pt. 50% of time spent counseling pt on dx an tx plans going forward.  Mackie Pai, PA-C

## 2018-09-11 NOTE — Telephone Encounter (Signed)
As noted below by Dr. Maylon Peppers, I informed patient that her iron level is low, and to pick up an OTC iron supplement and take one daily. Also, she needs to follow up with GI to rule out any occult GI bleeding. She verbalized understanding.

## 2018-09-13 ENCOUNTER — Encounter: Payer: Self-pay | Admitting: Medical

## 2018-09-19 DIAGNOSIS — Z79899 Other long term (current) drug therapy: Secondary | ICD-10-CM

## 2018-09-19 MED ORDER — FUROSEMIDE 20 MG PO TABS: 20.0000 mg | ORAL_TABLET | Freq: Two times a day (BID) | ORAL | 3 refills | Status: DC

## 2018-09-19 NOTE — Telephone Encounter (Signed)
Attempted to contact pt. Left message to call back.

## 2018-09-19 NOTE — Telephone Encounter (Signed)
Pt made aware to Stop torsemide and start lasix 20 mg BID. Pt also to have lab work drawn in 1 week. Pt voiced understanding and new orders placed.

## 2018-09-19 NOTE — Telephone Encounter (Signed)
Spoke with pt who report she was seen at [facility] for fluid overload. She state she was discharged with 20 mg of lasix  BID and it helped keep the fluid off. She report since stopping and only taking the torsemide, she is starting to retain fluid again. She report swelling in her face and abdomen. She also report SOB when laying down. Pt is requesting if MD could call in a Rx for lasix again as she feels this will help decreased the fluid. Will route to MD

## 2018-09-20 ENCOUNTER — Other Ambulatory Visit: Payer: Self-pay

## 2018-09-20 ENCOUNTER — Telehealth: Payer: Self-pay | Admitting: Medical

## 2018-09-20 DIAGNOSIS — Z0279 Encounter for issue of other medical certificate: Secondary | ICD-10-CM

## 2018-09-20 MED ORDER — FUROSEMIDE 20 MG PO TABS: 20.0000 mg | ORAL_TABLET | Freq: Two times a day (BID) | ORAL | 3 refills | Status: DC

## 2018-09-20 NOTE — Telephone Encounter (Signed)
Notified pt form will be mailed back to her.

## 2018-09-20 NOTE — Telephone Encounter (Signed)
I filled out pt handicap placard form. Placing it on your desk. Looks like she wants it mailed back to her.  Thanks

## 2018-09-21 ENCOUNTER — Other Ambulatory Visit: Payer: Self-pay | Admitting: *Deleted

## 2018-09-21 NOTE — Patient Outreach (Signed)
  [facility]) Care Management Chronic Special Needs Program    09/21/2018  Name: Alexis [name], DOB: October 10, 1950  MRN: [id number]   Alexis [name] is enrolled in a chronic special needs plan for Diabetes.  Attempted to reach Alexis [name] via mobile number to complete initial assessment; no answer; left HIPAA compliant message requesting return call..  Plan: If client does not return call, will make second outreach call within one week.  Kelli Churn RN, CCM, [facility] [phone]

## 2018-09-22 ENCOUNTER — Encounter (HOSPITAL_BASED_OUTPATIENT_CLINIC_OR_DEPARTMENT_OTHER): Payer: Self-pay | Admitting: *Deleted

## 2018-09-22 ENCOUNTER — Emergency Department (HOSPITAL_BASED_OUTPATIENT_CLINIC_OR_DEPARTMENT_OTHER): Payer: HMO

## 2018-09-22 ENCOUNTER — Emergency Department (HOSPITAL_BASED_OUTPATIENT_CLINIC_OR_DEPARTMENT_OTHER)
Admission: EM | Admit: 2018-09-22 | Discharge: 2018-09-22 | Disposition: A | Discharge status: Home / Self Care | Payer: HMO | Attending: Emergency Medicine | Admitting: Emergency Medicine

## 2018-09-22 ENCOUNTER — Other Ambulatory Visit: Payer: Self-pay

## 2018-09-22 DIAGNOSIS — R0602 Shortness of breath: Secondary | ICD-10-CM | POA: Insufficient documentation

## 2018-09-22 DIAGNOSIS — J449 Chronic obstructive pulmonary disease, unspecified: Secondary | ICD-10-CM | POA: Insufficient documentation

## 2018-09-22 DIAGNOSIS — F1210 Cannabis abuse, uncomplicated: Secondary | ICD-10-CM | POA: Insufficient documentation

## 2018-09-22 DIAGNOSIS — E1140 Type 2 diabetes mellitus with diabetic neuropathy, unspecified: Secondary | ICD-10-CM | POA: Insufficient documentation

## 2018-09-22 DIAGNOSIS — R350 Frequency of micturition: Secondary | ICD-10-CM | POA: Diagnosis not present

## 2018-09-22 DIAGNOSIS — Z87891 Personal history of nicotine dependence: Secondary | ICD-10-CM | POA: Insufficient documentation

## 2018-09-22 DIAGNOSIS — I110 Hypertensive heart disease with heart failure: Secondary | ICD-10-CM | POA: Insufficient documentation

## 2018-09-22 DIAGNOSIS — R0601 Orthopnea: Secondary | ICD-10-CM

## 2018-09-22 DIAGNOSIS — I5032 Chronic diastolic (congestive) heart failure: Secondary | ICD-10-CM | POA: Insufficient documentation

## 2018-09-22 DIAGNOSIS — E039 Hypothyroidism, unspecified: Secondary | ICD-10-CM | POA: Insufficient documentation

## 2018-09-22 DIAGNOSIS — Z79899 Other long term (current) drug therapy: Secondary | ICD-10-CM | POA: Insufficient documentation

## 2018-09-22 DIAGNOSIS — R609 Edema, unspecified: Secondary | ICD-10-CM | POA: Insufficient documentation

## 2018-09-22 DIAGNOSIS — Z950 Presence of cardiac pacemaker: Secondary | ICD-10-CM | POA: Diagnosis not present

## 2018-09-22 DIAGNOSIS — Z96659 Presence of unspecified artificial knee joint: Secondary | ICD-10-CM | POA: Insufficient documentation

## 2018-09-22 LAB — CBC
HCT: 32.3 % — ABNORMAL LOW (ref 36.0–46.0)
Hemoglobin: 9.4 g/dL — ABNORMAL LOW (ref 12.0–15.0)
MCH: 30.2 pg (ref 26.0–34.0)
MCHC: 29.1 g/dL — ABNORMAL LOW (ref 30.0–36.0)
MCV: 103.9 fL — ABNORMAL HIGH (ref 80.0–100.0)
Platelets: 76 10*3/uL — ABNORMAL LOW (ref 150–400)
RBC: 3.11 MIL/uL — ABNORMAL LOW (ref 3.87–5.11)
RDW: 15.6 % — ABNORMAL HIGH (ref 11.5–15.5)
WBC: 3.1 10*3/uL — ABNORMAL LOW (ref 4.0–10.5)
nRBC: 0 % (ref 0.0–0.2)

## 2018-09-22 LAB — BASIC METABOLIC PANEL
Anion gap: 13 (ref 5–15)
BUN: 23 mg/dL (ref 8–23)
CO2: 28 mmol/L (ref 22–32)
Calcium: 9.2 mg/dL (ref 8.9–10.3)
Chloride: 95 mmol/L — ABNORMAL LOW (ref 98–111)
Creatinine, Ser: 1.14 mg/dL — ABNORMAL HIGH (ref 0.44–1.00)
GFR calc Af Amer: 57 mL/min — ABNORMAL LOW (ref >60)
GFR calc non Af Amer: 49 mL/min — ABNORMAL LOW (ref >60)
Glucose, Bld: 249 mg/dL — ABNORMAL HIGH (ref 70–99)
Potassium: 3.4 mmol/L — ABNORMAL LOW (ref 3.5–5.1)
Sodium: 136 mmol/L (ref 135–145)

## 2018-09-22 MED ORDER — FUROSEMIDE 10 MG/ML IJ SOLN
20.0000 mg | Freq: Once | INTRAMUSCULAR | Status: AC
  Administered 2018-09-22: 20 mg via INTRAVENOUS

## 2018-09-22 NOTE — ED Triage Notes (Signed)
Weight loss since yesterday. She was started on Lasix 2 days and lost 20 pounds overnight. States she is here today for SOB.

## 2018-09-22 NOTE — ED Notes (Signed)
ED Provider at bedside. 

## 2018-09-22 NOTE — Discharge Instructions (Addendum)
You were seen today in the emergency room for shortness of breath. You were given a IV dose of lasix while here. Please DO NOT TAKE YOUR ORAL HOME LASIX today.  Follow-up with your primary care provider to ensure that you are maintaining your dry weight and check your kidney function.  Please return to the emergency department if you have weight gain, develop worsening shortness of breath, or lower leg edema.

## 2018-09-22 NOTE — ED Provider Notes (Signed)
[facility] EMERGENCY DEPARTMENT Provider Note   CSN: [id number] Arrival date & time: 09/22/18  1124    History   Chief Complaint Chief Complaint  Patient presents with  . Shortness of Breath    HPI Alexis [name] is a 68 y.o. female with an extensive PMH of CHF, diabetes, HTN, COPD, and peripheral vascular disease who presents today with shortness of breath. She states she is having dyspnea on exertion and orthopnea. Pt's weight at home yesterday was 240lb, but repeat weight this morning was 220lb. She believes her dry weight to be 217lb. She endorses increased edema in her abdomen and periorbitally. Pt feels her symptoms are improved from yesterday, though are not back to her baseline. She takes 70m lasix BID at home and does endorse that she has been urinating a lot.     Shortness of Breath Severity:  Mild Onset quality:  Sudden Duration:  1 day Chronicity:  Chronic Relieved by:  Sitting up and diuretics Associated symptoms: no abdominal pain, no chest pain, no cough, no fever and no wheezing   Risk factors: obesity   Risk factors comment:  CHF   Past Medical History:  Diagnosis Date  . Allergy   . Anxiety   . Arthritis   . Asthma   . Atrial fibrillation ([place]   . CHF (congestive heart failure) ([facility]   . COPD (chronic obstructive pulmonary disease) ([facility]   . Diabetes mellitus without complication (HD[place]   . Hyperlipidemia   . Hypertension   . Macrocytic anemia 09/04/2018  . Myocardial infarction (H[place]    several  . Pacemaker    CRT-P with RV lead and His Bundle lead ( high threshold)   . Peripheral neuropathy   . Pneumonia   . PONV (postoperative nausea and vomiting)    [facility]  . Thyroid disease     Patient Active Problem List   Diagnosis Date Noted  . Macrocytic anemia 09/04/2018  . Pacemaker 08/25/2018  . Insulin dependent diabetes mellitus with  complications ([facility] [id number] . Constipation 08/25/2018  . Fluid overload 08/25/2018  . Chronic anticoagulation 08/25/2018  . Morbid obesity (HS[place]acio 08/25/2018  . Chronic diastolic heart failure ([facility] 05/17/2018  . Acute bronchitis with COPD ([place] 01/16/2018  . Acquired absence of other right toe(s) (HM[place] 11/16/2017  . Diabetic ulcer of toe of right foot associated with type 2 diabetes mellitus (HTru[place] 11/16/2017  . Crush injury of left foot, initial encounter 11/28/2016  . Osteomyelitis (HD[place] 07/30/2016  . Gangrene of foot ([place] 04/27/2016  . Thrombocytopenia ([facility]   . Vomiting and diarrhea 04/05/2016  . Hyperlipidemia 11/18/2015  . Gout 08/05/2014  . Cough 08/05/2014  . Allergic rhinitis 07/12/2014  . Osteoarthritis 07/12/2014  . Asthma 07/12/2014  . Permanent atrial fibrillation 07/12/2014  . HTN (hypertension) 07/12/2014  . Hypothyroidism 07/12/2014  . History of substance abuse (HL[place] 07/12/2014  . Peripheral vascular disease (HC[place] 04/02/2013  . Discoloration of skin 04/02/2013    Past Surgical History:  Procedure Laterality Date  . ABDOMINAL AORTOGRAM W/LOWER EXTREMITY N/A 04/29/2016   Procedure: Abdominal Aortogram w/Lower Extremity;  Surgeon: BWaynetta Sandy MD;  [facility];  Service: Cardiovascular;  Laterality: N/A;  . ABDOMINAL AORTOGRAM W/LOWER EXTREMITY N/A 05/06/2017   Procedure: ABDOMINAL AORTOGRAM W/LOWER EXTREMITY;  Surgeon: DAngelia Mould MD;  [facility];  Service: Cardiovascular;  Laterality: N/A;  bilateral  . ABDOMINAL HYSTERECTOMY    . AMPUTATION Right 07/30/2016   Procedure: AMPUTATION RIGHT SECOND TOE;  Surgeon: Angelia Mould, MD;  Location: [facility];  Service: Vascular;  Laterality: Right;  . CARDIAC CATHETERIZATION     2005 at [facility]  . PERIPHERAL VASCULAR ATHERECTOMY Right 04/29/2016   Procedure: Peripheral Vascular Atherectomy-Right Popliteal;  Surgeon: Waynetta Sandy, MD;   Location: [facility] LAB;  Service: Cardiovascular;  Laterality: Right;  . PERIPHERAL VASCULAR INTERVENTION Right 04/29/2016   Procedure: Peripheral Vascular Intervention-Right Popliteal;  Surgeon: Waynetta Sandy, MD;  Location: [facility];  Service: Cardiovascular;  Laterality: Right;  POPLITEAL PTA  . RADIOFREQUENCY ABLATION    . TOTAL KNEE ARTHROPLASTY    . TUBAL LIGATION       OB History   No obstetric history on file.      Home Medications    Prior to Admission medications   Medication Sig Start Date End Date Taking? Authorizing Provider  albuterol (PROVENTIL HFA;VENTOLIN HFA) 108 (90 Base) MCG/ACT inhaler Inhale 2 puffs into the lungs every 6 (six) hours as needed for wheezing or shortness of breath. 03/15/17   Saguier, Percell Miller, PA-C  allopurinol (ZYLOPRIM) 100 MG tablet TAKE ONE TABLET BY MOUTH TWICE DAILY 08/07/18   Saguier, Percell Miller, PA-C  AMBULATORY NON [place] scooter.  Diagnosis: Osteoarthritis M19.90 09/10/16   Carollee Herter, Alferd Apa, DO  Continuous Blood Gluc Sensor (FREESTYLE LIBRE 14 DAY SENSOR) MISC  05/20/18   [provider]  DULoxetine (CYMBALTA) 30 MG capsule TAKE ONE CAPSULE BY MOUTH EVERY NIGHT AT BEDTIME 05/16/18   Saguier, Percell Miller, PA-C  fluticasone [facility]) 50 MCG/ACT nasal spray Place 2 sprays into both nostrils at bedtime. Patient taking differently: Place 2 sprays into both nostrils as needed.  07/31/16   Alvia Grove, PA-C  fluticasone (FLOVENT HFA) 110 MCG/ACT inhaler Inhale 2 puffs into the lungs 2 (two) times daily as needed (bronchitis). 07/31/16   Alvia Grove, PA-C  furosemide (LASIX) 20 MG tablet Take 1 tablet (20 mg total) by mouth 2 (two) times daily. 09/20/18   Buford Dresser, MD  Insulin Disposable Pump (V-GO 40) KIT Inject into the skin See admin instructions. Use with Novolog - 6 pumps before each meal 11/21/15   [provider]  Levothyroxine Sodium 150 MCG CAPS Take 1 capsule (150  mcg total) by mouth daily before breakfast. 09/20/17   Saguier, Percell Miller, PA-C  metoprolol succinate (TOPROL-XL) 25 MG 24 hr tablet TAKE ONE (1) TABLET BY MOUTH EACH DAY 09/11/18   Saguier, Percell Miller, PA-C  NOVOLOG 100 UNIT/ML injection 100 units per day in VGO unknown bolus 06/23/16   [provider]  pantoprazole (PROTONIX) 40 MG tablet Take 1 tablet (40 mg total) by mouth 2 (two) times daily. 08/25/18   Erlene Quan, PA-C  Prenatal Vit-Fe Fumarate-FA (MULTIVITAMIN-PRENATAL) 27-0.8 MG TABS tablet Take 1 tablet by mouth daily. Takes for nail and hair growth     [provider]  psyllium (METAMUCIL SMOOTH TEXTURE) 58.6 % powder Take 1 packet by mouth daily. 08/25/18   Erlene Quan, PA-C  Semaglutide 0.25 or 0.5 MG/DOSE SOPN Inject 0.65m weekly 05/30/17   [provider]    Family History Family History  Problem Relation Age of Onset  . Diabetes Mother   . Hyperlipidemia Mother   . Diabetes Father   . Hyperlipidemia Father   . Heart disease Father        before  age 63  . Heart attack Father   . Diabetes Brother   . Hyperlipidemia Brother   . Heart attack Brother     Social History Social History   Tobacco Use  . Smoking status: Former Smoker    Quit date: 03/01/1998    Years since quitting: 20.5  . Smokeless tobacco: Never Used  Substance Use Topics  . 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  . Drug use: Yes    Types: Marijuana    Comment: remote use     Allergies   Indomethacin, Pregabalin, Sulfa antibiotics, and Morphine and related   Review of Systems Review of Systems  Constitutional: Negative for chills and fever.  Respiratory: Positive for shortness of breath. Negative for cough, chest tightness and wheezing.   Cardiovascular: Negative for chest pain, palpitations and leg swelling.  Gastrointestinal: Negative for abdominal pain.  Endocrine: Negative for polydipsia.      Physical Exam Updated Vital Signs BP (!) 127/59 (BP Location: Right Arm)   Pulse 83   Temp 98.5 F (36.9 C) (Oral)   Resp 18   Ht _0  (1.575 m)   Wt 100.7 kg   SpO2 94%   BMI 40.60 kg/m   Physical Exam Vitals signs and nursing note reviewed.  Constitutional:      General: She is not in acute distress.    Appearance: She is well-developed.     Comments: Laying down with head of the bed elevated  HENT:     Head: Normocephalic and atraumatic.  Eyes:     Conjunctiva/sclera: Conjunctivae normal.     Comments: Bilateral periorbital edema present.  Neck:     Musculoskeletal: Neck supple.  Cardiovascular:     Rate and Rhythm: Normal rate and regular rhythm.     Heart sounds: No murmur.  Pulmonary:     Effort: Pulmonary effort is normal. No tachypnea or respiratory distress.     Breath sounds: Normal breath sounds.     Comments: Able to speak in full sentences. Abdominal:     Palpations: Abdomen is soft.     Tenderness: There is no abdominal tenderness.  Skin:    General: Skin is warm and dry.  Neurological:     Mental Status: She is alert.     ED Treatments / Results  Labs (all labs ordered are listed, but only abnormal results are displayed) Labs Reviewed  BASIC METABOLIC PANEL - Abnormal; Notable for the following components:      Result Value   Potassium 3.4 (*)    Chloride 95 (*)    Glucose, Bld 249 (*)    Creatinine, Ser 1.14 (*)    GFR calc non Af Amer 49 (*)    GFR calc Af Amer 57 (*)    All other components within normal limits  CBC - Abnormal; Notable for the following components:   WBC 3.1 (*)    RBC 3.11 (*)    Hemoglobin 9.4 (*)    HCT 32.3 (*)    MCV 103.9 (*)    MCHC 29.1 (*)    RDW 15.6 (*)    Platelets 76 (*)    All other components within normal limits    EKG EKG Interpretation  Date/Time:  Friday September 22 2018 11:41:21 EDT Ventricular Rate:  67 PR Interval:    QRS Duration: 98 QT Interval:  407 QTC Calculation: 430 R  Axis:   92 Text Interpretation:  Atrial fibrillation Right axis deviation Low voltage, precordial leads Anteroseptal infarct, old Nonspecific repol abnormality, diffuse leads No significant change since last tracing Confirmed by Quintella Reichert [id number]) on 09/22/2018 12:17:00 PM   Radiology Dg Chest Portable 1 View  Result Date: 09/22/2018 CLINICAL DATA:  Shortness of breath. EXAM: PORTABLE CHEST 1 VIEW COMPARISON:  09/09/2018 FINDINGS: Cardiomegaly and LEFT-sided pacemaker again noted. Pulmonary vascular congestion is present. There is no evidence of focal airspace disease, pulmonary edema, suspicious pulmonary nodule/mass, pleural effusion, or pneumothorax. No acute bony abnormalities are identified. IMPRESSION: Cardiomegaly with pulmonary vascular congestion. Electronically Signed   By: Margarette Canada M.D.   On: 09/22/2018 12:45    Procedures Procedures (including critical care time)  Medications Ordered in ED Medications  furosemide (LASIX) injection 20 mg (20 mg Intravenous Given 09/22/18 1421)     Initial Impression / Assessment and Plan / ED Course  I have reviewed the triage vital signs and the nursing notes.  Pertinent labs & imaging results that were available during my care of the patient were reviewed by me and considered in my medical decision making (see chart for details).    TARRIN MENN is a 68 y.o. female with an extensive PMH of CHF, diabetes, HTN, COPD, and peripheral vascular disease who presents today with shortness of breath that started to improve at home. Pt appears to be diuresising well on home lasix regimen and is not in any acute or respiratory distress today in the ED. Will give her a one time dose of 37m IV lasix while in the ED today and instruct her to continue her regular home medication tomorrow. Per pt, she is scheduled for lab work and follow-up with cardiology next week.     Final Clinical Impressions(s) / ED Diagnoses   Final diagnoses:  Orthopnea   SOBOE (shortness of breath on exertion)    ED Discharge Orders    None       JLadona Horns MD 09/22/18 1454    RQuintella Reichert MD 09/23/18 1405    RQuintella Reichert MD 09/23/18 1406

## 2018-09-25 ENCOUNTER — Ambulatory Visit: Payer: Self-pay | Admitting: *Deleted

## 2018-09-25 ENCOUNTER — Other Ambulatory Visit: Payer: Self-pay | Admitting: *Deleted

## 2018-09-25 NOTE — Patient Outreach (Signed)
  [facility]) Care Management Chronic Special Needs Program    09/25/2018  Name: Alexis [name], DOB: [birth year]  MRN: [id number]   Ms. Alexis [name] is enrolled in a chronic special needs plan for Diabetes and Heart Failure.  This is the second attempt to reach Alexis [name] via mobile number to complete initial assessment; no answer; left HIPAA compliant message requesting return call.  Plan: If client does not return call, will make third outreach call within one week.  Kelli Churn RN, CCM, [facility] [phone]

## 2018-09-26 ENCOUNTER — Other Ambulatory Visit: Payer: Self-pay

## 2018-09-26 ENCOUNTER — Encounter: Payer: Self-pay | Admitting: Cardiology

## 2018-09-26 ENCOUNTER — Ambulatory Visit (INDEPENDENT_AMBULATORY_CARE_PROVIDER_SITE_OTHER): Payer: HMO | Admitting: Cardiology

## 2018-09-26 VITALS — BP 120/60 | HR 73 | Ht 62.0 in | Wt 235.1 lb

## 2018-09-26 DIAGNOSIS — I480 Paroxysmal atrial fibrillation: Secondary | ICD-10-CM

## 2018-09-26 DIAGNOSIS — I10 Essential (primary) hypertension: Secondary | ICD-10-CM | POA: Diagnosis not present

## 2018-09-26 DIAGNOSIS — I739 Peripheral vascular disease, unspecified: Secondary | ICD-10-CM

## 2018-09-26 DIAGNOSIS — I5032 Chronic diastolic (congestive) heart failure: Secondary | ICD-10-CM

## 2018-09-26 DIAGNOSIS — E785 Hyperlipidemia, unspecified: Secondary | ICD-10-CM | POA: Diagnosis not present

## 2018-09-26 NOTE — Patient Instructions (Addendum)
Medication Instructions:  Your physician recommends that you continue on your current medications as directed. Please refer to the Current Medication list given to you today.  If you need a refill on your cardiac medications before your next appointment, please call your pharmacy.   Lab work: Your physician recommends that you return for lab work today: bmp, pro bnp, and cbc   If you have labs (blood work) drawn today and your tests are completely normal, you will receive your results only by: Marland Kitchen MyChart Message (if you have MyChart) OR . A paper copy in the mail If you have any lab test that is abnormal or we need to change your treatment, we will call you to review the results.  Testing/Procedures: Your physician has requested that you have an echocardiogram. Echocardiography is a painless test that uses sound waves to create images of your heart. It provides your doctor with information about the size and shape of your heart and how well your heart's chambers and valves are working. This procedure takes approximately one hour. There are no restrictions for this procedure.    Follow-Up: At [facility], you and your health needs are our priority.  As part of our continuing mission to provide you with exceptional heart care, we have created designated Provider Care Teams.  These Care Teams include your primary Cardiologist (physician) and Advanced Practice Providers (APPs -  Physician Assistants and Nurse Practitioners) who all work together to provide you with the care you need, when you need it. . You will need a follow up appointment in 3 weeks.    Any Other Special Instructions Will Be Listed Below (If Applicable).    Echocardiogram An echocardiogram is a procedure that uses painless sound waves (ultrasound) to produce an image of the heart. Images from an echocardiogram can provide important information about:  Signs of coronary artery disease (CAD).  Aneurysm detection. An  aneurysm is a weak or damaged part of an artery wall that bulges out from the normal force of blood pumping through the body.  Heart size and shape. Changes in the size or shape of the heart can be associated with certain conditions, including heart failure, aneurysm, and CAD.  Heart muscle function.  Heart valve function.  Signs of a past heart attack.  Fluid buildup around the heart.  Thickening of the heart muscle.  A tumor or infectious growth around the heart valves. Tell a health care provider about:  Any allergies you have.  All medicines you are taking, including vitamins, herbs, eye drops, creams, and over-the-counter medicines.  Any blood disorders you have.  Any surgeries you have had.  Any medical conditions you have.  Whether you are pregnant or may be pregnant. What are the risks? Generally, this is a safe procedure. However, problems may occur, including:  Allergic reaction to dye (contrast) that may be used during the procedure. What happens before the procedure? No specific preparation is needed. You may eat and drink normally. What happens during the procedure?   An IV tube may be inserted into one of your veins.  You may receive contrast through this tube. A contrast is an injection that improves the quality of the pictures from your heart.  A gel will be applied to your chest.  A wand-like tool (transducer) will be moved over your chest. The gel will help to transmit the sound waves from the transducer.  The sound waves will harmlessly bounce off of your heart to allow the heart  images to be captured in real-time motion. The images will be recorded on a computer. The procedure may vary among health care providers and hospitals. What happens after the procedure?  You may return to your normal, everyday life, including diet, activities, and medicines, unless your health care provider tells you not to do that. Summary  An echocardiogram is a  procedure that uses painless sound waves (ultrasound) to produce an image of the heart.  Images from an echocardiogram can provide important information about the size and shape of your heart, heart muscle function, heart valve function, and fluid buildup around your heart.  You do not need to do anything to prepare before this procedure. You may eat and drink normally.  After the echocardiogram is completed, you may return to your normal, everyday life, unless your health care provider tells you not to do that. This information is not intended to replace advice given to you by your health care provider. Make sure you discuss any questions you have with your health care provider. Document Released: 02/13/2000 Document Revised: 06/08/2018 Document Reviewed: 03/20/2016 Elsevier Patient Education  2020 [employer].

## 2018-09-26 NOTE — Progress Notes (Signed)
Cardiology Consultation:    Date:  09/26/2018   ID:  Alexis [name], DOB 01-05-1951, MRN [id number]  PCP:  Mackie Pai, PA-C  Cardiologist:  Jenne Campus, MD   Referring MD: Elise Benne   Chief Complaint  Patient presents with  . Congestive Heart Failure  This is a very complex lady with multiple issues  History of Present Illness:    Alexis [name] is a 68 y.o. female who is being seen today for the evaluation of shortness of breath at the request of [place], Percell Miller, [place].  Very complex past medical history.  Apparently 2005 she got some procedures done on her heart what she described to me looks like atrial fibrillation ablation.  She is being anticoagulated since that time.  2 years later she received pacemaker.  This is a Medtronic device pacemaker has been replaced few times since then.  Recently she started experiencing weight gain as well as shortness of breath.  She ended going to the emergency room she was given extra diuretic there improved but still not up to the par.  Exercises and simple walking give her problems.  She also does have significant peripheral vascular disease involving lower extremities that being followed by vascular surgeon.  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  Denies having any chest pain tightness squeezing pressure burning chest no palpitations does have some difficulty sleeping at night.  Past Medical History:  Diagnosis Date  . Allergy   . Anxiety   . Arthritis   . Asthma   . Atrial fibrillation ([place])   . CHF (congestive heart failure) ([place])   . COPD (chronic obstructive pulmonary disease) ([place])   . Diabetes mellitus without complication ([place])   . Hyperlipidemia   . Hypertension   . Macrocytic anemia 09/04/2018  . Myocardial infarction ([place])    several  . Pacemaker    CRT-P with RV lead and His Bundle lead ( high threshold)   . Peripheral neuropathy   . Pneumonia   . PONV (postoperative nausea and  vomiting)    unsure of exactly what happened at [facility] in 2010 (knee surgery) that led to crital care  . Thyroid disease     Past Surgical History:  Procedure Laterality Date  . ABDOMINAL AORTOGRAM W/LOWER EXTREMITY N/A 04/29/2016   Procedure: Abdominal Aortogram w/Lower Extremity;  Surgeon: Waynetta Sandy, MD;  Location: [place] [facility];  Service: Cardiovascular;  Laterality: N/A;  . ABDOMINAL AORTOGRAM W/LOWER EXTREMITY N/A 05/06/2017   Procedure: ABDOMINAL AORTOGRAM W/LOWER EXTREMITY;  Surgeon: Angelia Mould, MD;  Location: [place] [facility];  Service: Cardiovascular;  Laterality: N/A;  bilateral  . ABDOMINAL HYSTERECTOMY    . AMPUTATION Right 07/30/2016   Procedure: AMPUTATION RIGHT SECOND TOE;  Surgeon: Angelia Mould, MD;  Location: [place];  Service: Vascular;  Laterality: Right;  . CARDIAC CATHETERIZATION     2005 at [facility]  . PERIPHERAL VASCULAR ATHERECTOMY Right 04/29/2016   Procedure: Peripheral Vascular Atherectomy-Right Popliteal;  Surgeon: Waynetta Sandy, MD;  Location: [place] [facility];  Service: Cardiovascular;  Laterality: Right;  . PERIPHERAL VASCULAR INTERVENTION Right 04/29/2016   Procedure: Peripheral Vascular Intervention-Right Popliteal;  Surgeon: Waynetta Sandy, MD;  Location: [place] [facility];  Service: Cardiovascular;  Laterality: Right;  POPLITEAL PTA  . RADIOFREQUENCY ABLATION    . TOTAL KNEE ARTHROPLASTY    . TUBAL LIGATION      Current Medications: Current  Meds  Medication Sig  . albuterol (PROVENTIL HFA;VENTOLIN HFA) 108 (90 Base) MCG/ACT inhaler Inhale 2 puffs into the lungs every 6 (six) hours as needed for wheezing or shortness of breath.  . allopurinol (ZYLOPRIM) 100 MG tablet TAKE ONE TABLET BY MOUTH TWICE DAILY  . AMBULATORY NON FORMULARY MEDICATION Motorized scooter.  Diagnosis: Osteoarthritis M19.90  . Continuous Blood Gluc Sensor (FREESTYLE LIBRE 14 DAY SENSOR) MISC   .  DULoxetine (CYMBALTA) 30 MG capsule TAKE ONE CAPSULE BY MOUTH EVERY NIGHT AT BEDTIME  . fluticasone (FLONASE) 50 MCG/ACT nasal spray Place 2 sprays into both nostrils at bedtime. (Patient taking differently: Place 2 sprays into both nostrils as needed. )  . fluticasone (FLOVENT HFA) 110 MCG/ACT inhaler Inhale 2 puffs into the lungs 2 (two) times daily as needed (bronchitis).  . furosemide (LASIX) 20 MG tablet Take 1 tablet (20 mg total) by mouth 2 (two) times daily.  . Insulin Disposable Pump (V-GO 40) KIT Inject into the skin See admin instructions. Use with Novolog - 6 pumps before each meal  . Levothyroxine Sodium 150 MCG CAPS Take 1 capsule (150 mcg total) by mouth daily before breakfast.  . metoprolol succinate (TOPROL-XL) 25 MG 24 hr tablet TAKE ONE (1) TABLET BY MOUTH EACH DAY  . NOVOLOG 100 UNIT/ML injection 100 units per day in VGO unknown bolus  . Prenatal Vit-Fe Fumarate-FA (MULTIVITAMIN-PRENATAL) 27-0.8 MG TABS tablet Take 1 tablet by mouth daily. Takes for nail and hair growth   . psyllium (METAMUCIL SMOOTH TEXTURE) 58.6 % powder Take 1 packet by mouth daily.     Allergies:   Indomethacin, Pregabalin, Sulfa antibiotics, and Morphine and related   Social History   Socioeconomic History  . Marital status: Married    Spouse name: Not on file  . Number of children: Not on file  . Years of education: Not on file  . Highest education level: Not on file  Occupational History  . Occupation: retired  Scientific laboratory technician  . Financial resource strain: Not on file  . Food insecurity    Worry: Not on file    Inability: Not on file  . Transportation needs    Medical: Not on file    Non-medical: Not on file  Tobacco Use  . Smoking status: Former Smoker    Quit date: 03/01/1998    Years since quitting: 20.5  . Smokeless tobacco: Never Used  Substance and Sexual Activity  . 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  . Drug use: Yes    Types: Marijuana    Comment: remote use  . Sexual activity: Never  Lifestyle  . Physical activity    Days per week: Not on file    Minutes per session: Not on file  . Stress: Not on file  Relationships  . Social Herbalist on phone: Not on file    Gets together: Not on file    Attends religious service: Not on file    Active member of club or organization: Not on file    Attends meetings of clubs or organizations: Not on file    Relationship status: Not on file  Other Topics Concern  . Not on file  Social History Narrative  . Not on file     Family History: The patient's family history includes Diabetes in her brother, father, and mother; Heart attack in her brother and father;  Heart disease in her father; Hyperlipidemia in her brother, father, and mother. ROS:   Please see the history of present illness.    All 14 point review of systems negative except as described per history of present illness.  EKGs/Labs/Other Studies Reviewed:    The following studies were reviewed today:   EKG:  EKG is  ordered today.  The ekg ordered today demonstrates atrial fibrillation with controlled ventricular rate, nonspecific ST segment changes, right axis deviation  Recent Labs: 08/25/2018: NT-Pro BNP 293 09/08/2018: ALT 23 09/09/2018: B Natriuretic Peptide 133.5 09/22/2018: BUN 23; Creatinine, Ser 1.14; Hemoglobin 9.4; Platelets 76; Potassium 3.4; Sodium 136  Recent Lipid Panel    Component Value Date/Time   CHOL 93 (L) 05/10/2018 0944   TRIG 147 05/10/2018 0944   HDL 31 (L) 05/10/2018 0944   CHOLHDL 3.0 05/10/2018 0944   CHOLHDL 6 04/10/2015 1040   VLDL 50.8 (H) 04/10/2015 1040   LDLCALC 33 05/10/2018 0944   LDLDIRECT 111.0 04/10/2015 1040    Physical Exam:    VS:  BP 120/60   Pulse 73   Ht 5' 2"  (1.575 m)   Wt 235 lb 1.9 oz (106.6 kg)   SpO2 99%   BMI 43.00 kg/m     Wt Readings from Last 3 Encounters:  09/26/18 235 lb 1.9 oz (106.6  kg)  09/22/18 222 lb (100.7 kg)  09/11/18 229 lb 12.8 oz (104.2 kg)     GEN:  Well nourished, well developed in no acute distress HEENT: Normal NECK: No JVD; No carotid bruits LYMPHATICS: No lymphadenopathy CARDIAC: Irregular, no murmurs, no rubs, no gallops RESPIRATORY:  Clear to auscultation without rales, wheezing or rhonchi  ABDOMEN: Soft, non-tender, non-distended MUSCULOSKELETAL:  No edema; No deformity  SKIN: Warm and dry NEUROLOGIC:  Alert and oriented x 3 PSYCHIATRIC:  Normal affect   ASSESSMENT:    1. Chronic diastolic heart failure ([place])   2. Peripheral vascular disease ([place])   3. Essential hypertension   4. Paroxysmal atrial fibrillation (HCC)   5. Hyperlipidemia, unspecified hyperlipidemia type    PLAN:    In order of problems listed above:  1. Chronic diastolic congestive heart failure.  I will ask you to have an echocardiogram done to assess left ventricular ejection fraction.  I will not alter any of her medication until we get better understanding what the problem seems to be.  Hopefully will get Chem-7 back tomorrow and I will adjust her medication. 2. Peripheral vascular disease to be followed by vascular surgeon that she is getting ready to see next week. 3. Essential hypertension appears to be well controlled continue present management. 4. Paroxysmal atrial fibrillation with chads 2 Vascor equals 4.  She is to be anticoagulated but according to her anticoagulation has been withdrawn recently because she got a dark stool and significant anemia.  Luckily she did not require blood transfusion.  I will check her CBC today.  She may require a watchman device. 5. Pacemaker present this is a Medtronic device apparently she does have his bundle lead that got very high [place].  We will make arrangements for interrogation of the device.  Interrogation of the device also will be helpful trying to determine how much of atrial fibrillation she has.   Medication  Adjustments/Labs and Tests Ordered: Current medicines are reviewed at length with the patient today.  Concerns regarding medicines are outlined above.  No orders of the defined types were placed in this encounter.  No orders of the defined  types were placed in this encounter.   Signed, Park Liter, MD, [facility]. 09/26/2018 4:38 PM    [place]

## 2018-09-27 ENCOUNTER — Ambulatory Visit (HOSPITAL_BASED_OUTPATIENT_CLINIC_OR_DEPARTMENT_OTHER)
Admission: RE | Admit: 2018-09-27 | Discharge: 2018-09-27 | Disposition: A | Discharge status: Home / Self Care | Payer: HMO | Source: Ambulatory Visit | Attending: Hematology | Admitting: Hematology

## 2018-09-27 ENCOUNTER — Encounter (HOSPITAL_BASED_OUTPATIENT_CLINIC_OR_DEPARTMENT_OTHER): Payer: Self-pay

## 2018-09-27 DIAGNOSIS — K921 Melena: Secondary | ICD-10-CM | POA: Diagnosis not present

## 2018-09-27 DIAGNOSIS — I517 Cardiomegaly: Secondary | ICD-10-CM | POA: Diagnosis not present

## 2018-09-27 DIAGNOSIS — R188 Other ascites: Secondary | ICD-10-CM | POA: Diagnosis not present

## 2018-09-27 DIAGNOSIS — K828 Other specified diseases of gallbladder: Secondary | ICD-10-CM | POA: Diagnosis not present

## 2018-09-27 DIAGNOSIS — K8020 Calculus of gallbladder without cholecystitis without obstruction: Secondary | ICD-10-CM | POA: Diagnosis not present

## 2018-09-27 DIAGNOSIS — D696 Thrombocytopenia, unspecified: Secondary | ICD-10-CM | POA: Diagnosis not present

## 2018-09-27 DIAGNOSIS — K7689 Other specified diseases of liver: Secondary | ICD-10-CM | POA: Diagnosis not present

## 2018-09-27 DIAGNOSIS — K5730 Diverticulosis of large intestine without perforation or abscess without bleeding: Secondary | ICD-10-CM | POA: Diagnosis not present

## 2018-09-27 LAB — BASIC METABOLIC PANEL
BUN/Creatinine Ratio: 25 (ref 12–28)
BUN: 22 mg/dL (ref 8–27)
CO2: 28 mmol/L (ref 20–29)
Calcium: 9.5 mg/dL (ref 8.7–10.3)
Chloride: 97 mmol/L (ref 96–106)
Creatinine, Ser: 0.87 mg/dL (ref 0.57–1.00)
GFR calc Af Amer: 79 mL/min/{1.73_m2} (ref >59)
GFR calc non Af Amer: 68 mL/min/{1.73_m2} (ref >59)
Glucose: 217 mg/dL — ABNORMAL HIGH (ref 65–99)
Potassium: 4.1 mmol/L (ref 3.5–5.2)
Sodium: 142 mmol/L (ref 134–144)

## 2018-09-27 LAB — CBC
Hematocrit: 29.7 % — ABNORMAL LOW (ref 34.0–46.6)
Hemoglobin: 9.5 g/dL — ABNORMAL LOW (ref 11.1–15.9)
MCH: 29.6 pg (ref 26.6–33.0)
MCHC: 32 g/dL (ref 31.5–35.7)
MCV: 93 fL (ref 79–97)
Platelets: 75 10*3/uL — CL (ref 150–450)
RBC: 3.21 x10E6/uL — ABNORMAL LOW (ref 3.77–5.28)
RDW: 13.8 % (ref 11.7–15.4)
WBC: 3.6 10*3/uL (ref 3.4–10.8)

## 2018-09-27 LAB — PRO B NATRIURETIC PEPTIDE: NT-Pro BNP: 417 pg/mL — ABNORMAL HIGH (ref 0–301)

## 2018-09-27 MED ORDER — IOHEXOL 300 MG/ML  SOLN
100.0000 mL | Freq: Once | INTRAMUSCULAR | Status: AC | PRN
  Administered 2018-09-27: 100 mL via INTRAVENOUS

## 2018-09-29 ENCOUNTER — Other Ambulatory Visit: Payer: Self-pay | Admitting: *Deleted

## 2018-09-29 ENCOUNTER — Telehealth: Payer: Self-pay | Admitting: *Deleted

## 2018-09-29 ENCOUNTER — Ambulatory Visit: Payer: Self-pay | Admitting: *Deleted

## 2018-09-29 NOTE — Patient Outreach (Signed)
  [place] [facility]) Care Management Chronic Special Needs Program    09/29/2018   Name: Alexis [name], DOB: [birth year]  MRN: [id number]   Ms. Alexis [name] is enrolled in a chronic special needs plan for Diabetes and Heart Failure.  This is the third attempt to reach Alexis [name] via mobile number to complete initial assessment; no answer; left HIPAA compliant message requesting return call.  Plan: If client does not return call, will mail unsuccessful outreach letter within one week.  Kelli Churn RN, CCM, [facility] Management [phone]

## 2018-09-29 NOTE — Telephone Encounter (Signed)
As noted below by Dr. Marin Olp, I informed her that the CT scan showed no evidence of malignancy. However, it did show liver cirrhosis, which is likely causing the low platelet count. She verbalized understanding.

## 2018-09-29 NOTE — Telephone Encounter (Signed)
-----   Message from Tish Men, MD sent at 09/28/2018 11:15 AM EDT ----- Alexis [name],  Can you let Ms. Vanderhoff know that her CT showed liver cirrhosis, likely causing her low platelet count? There was no evidence of malignancy.  Thanks.  [place] ----- Message ----- From: Interface, Rad Results In Sent: 09/28/2018   8:44 AM EDT To: Tish Men, MD

## 2018-10-02 ENCOUNTER — Telehealth: Payer: Self-pay | Admitting: Medical

## 2018-10-02 ENCOUNTER — Encounter: Payer: Self-pay | Admitting: Medical

## 2018-10-02 ENCOUNTER — Encounter: Payer: Self-pay | Admitting: Hematology

## 2018-10-02 ENCOUNTER — Other Ambulatory Visit: Payer: Self-pay

## 2018-10-02 ENCOUNTER — Ambulatory Visit (HOSPITAL_BASED_OUTPATIENT_CLINIC_OR_DEPARTMENT_OTHER)
Admission: RE | Admit: 2018-10-02 | Discharge: 2018-10-02 | Disposition: A | Discharge status: Home / Self Care | Payer: HMO | Source: Ambulatory Visit | Attending: Cardiology | Admitting: Cardiology

## 2018-10-02 DIAGNOSIS — I5032 Chronic diastolic (congestive) heart failure: Secondary | ICD-10-CM | POA: Diagnosis not present

## 2018-10-02 DIAGNOSIS — I10 Essential (primary) hypertension: Secondary | ICD-10-CM | POA: Insufficient documentation

## 2018-10-02 DIAGNOSIS — K7460 Unspecified cirrhosis of liver: Secondary | ICD-10-CM

## 2018-10-02 DIAGNOSIS — I739 Peripheral vascular disease, unspecified: Secondary | ICD-10-CM | POA: Insufficient documentation

## 2018-10-02 MED ORDER — PERFLUTREN LIPID MICROSPHERE
1.0000 mL | INTRAVENOUS | Status: AC | PRN
  Administered 2018-10-02: 4 mL via INTRAVENOUS
  Filled 2018-10-02: qty 10

## 2018-10-02 NOTE — Telephone Encounter (Signed)
Looks like the cirrhosis was found in her CT by Dr. Marin Olp.

## 2018-10-02 NOTE — Progress Notes (Signed)
  Echocardiogram 2D Echocardiogram has been performed.  Alexis [name] 10/02/2018, 4:44 PM

## 2018-10-02 NOTE — Telephone Encounter (Signed)
Referral to GI placed

## 2018-10-03 ENCOUNTER — Telehealth (HOSPITAL_COMMUNITY): Payer: Self-pay

## 2018-10-03 ENCOUNTER — Other Ambulatory Visit: Payer: Self-pay | Admitting: Cardiology

## 2018-10-03 NOTE — Telephone Encounter (Signed)
The above patient or their representative was contacted and gave the following answers to these questions:         Do you have any of the following symptoms?  no  Fever                    Cough                   Shortness of breath  Do  you have any of the following other symptoms? no   muscle pain         vomiting,        diarrhea        rash         weakness        red eye        abdominal pain         bruising          bruising or bleeding              joint pain           severe headache    Have you been in contact with someone who was or has been sick in the past 2 weeks?  no  Yes                 Unsure                         Unable to assess   Does the person that you were in contact with have any of the following symptoms?   Cough         shortness of breath           muscle pain         vomiting,            diarrhea            rash            weakness           fever            red eye           abdominal pain           bruising  or  bleeding                joint pain                severe headache                 COMMENTS OR ACTION PLAN FOR THIS PATIENT:

## 2018-10-04 ENCOUNTER — Other Ambulatory Visit: Payer: Self-pay

## 2018-10-04 ENCOUNTER — Ambulatory Visit (HOSPITAL_COMMUNITY)
Admission: RE | Admit: 2018-10-04 | Discharge: 2018-10-04 | Disposition: A | Discharge status: Home / Self Care | Payer: HMO | Source: Ambulatory Visit | Attending: Vascular Surgery | Admitting: Vascular Surgery

## 2018-10-04 ENCOUNTER — Encounter: Payer: Self-pay | Admitting: Vascular Surgery

## 2018-10-04 ENCOUNTER — Ambulatory Visit (INDEPENDENT_AMBULATORY_CARE_PROVIDER_SITE_OTHER): Payer: HMO | Admitting: Vascular Surgery

## 2018-10-04 VITALS — BP 114/68 | HR 75 | Temp 97.7°F | Resp 20 | Ht 62.0 in | Wt 235.0 lb

## 2018-10-04 DIAGNOSIS — I739 Peripheral vascular disease, unspecified: Secondary | ICD-10-CM

## 2018-10-04 NOTE — Progress Notes (Signed)
Patient name: Alexis [name] MRN: [id number] DOB: 09-04-1950 Sex: female  REASON FOR VISIT:   Follow-up of peripheral vascular disease.  HPI:   Alexis [name] is a pleasant 68 y.o. female who I last saw on 04/19/2018.  She has infrainguinal arterial occlusive disease bilaterally.  On the right side Dr. Donzetta Matters performed jetstream atherectomy of the right superficial femoral artery and popliteal artery with drug-coated balloon angioplasty.  She had peroneal runoff only on the right.  In March 2019 she had an arteriogram and on the left side the superficial femoral artery was patent proximally with mild to moderate diffuse disease.  There was a 95% stenosis in the distal superficial femoral artery with disease in the popliteal artery below that at the level of the knee.  The below-knee popliteal artery was patent with two-vessel runoff via the anterior tibial and peroneal arteries.  Her previous studies of also included a saphenous vein map that was done in April 2019 which showed that on the right side the vein was marginal in size.  She comes in for a 45-monthfollow-up study.  I have been trying to follow her conservatively given that I do not think she would be a good candidate for surgery given her obesity and the risk of wound healing problems.  Since I saw her last she is been in the hospital twice with congestive heart failure.  She is also recently been diagnosed with cirrhosis.  She has a remote history of alcohol use.  She does admit to right calf and ankle claudication.  She has no symptoms on the left side.  She denies any rest pain.  She does have neuropathy related to her diabetes.  Past Medical History:  Diagnosis Date  . Allergy   . Anxiety   . Arthritis   . Asthma   . Atrial fibrillation ([place]   . CHF (congestive heart failure) ([facility]   . Cirrhosis ([facility]   . COPD (chronic obstructive pulmonary disease) ([facility]   . Diabetes mellitus without complication ([place]   .  Hyperlipidemia   . Hypertension   . Macrocytic anemia 09/04/2018  . Myocardial infarction ([place]    several  . Pacemaker    CRT-P with RV lead and His Bundle lead ( high threshold)   . Peripheral neuropathy   . Pneumonia   . PONV (postoperative nausea and vomiting)    [facility]  . Thyroid disease     Family History  Problem Relation Age of Onset  . Diabetes Mother   . Hyperlipidemia Mother   . Diabetes Father   . Hyperlipidemia Father   . Heart disease Father        before age 70 . Heart attack Father   . Diabetes Brother   . Hyperlipidemia Brother   . Heart attack Brother     SOCIAL HISTORY: Social History   Tobacco Use  . Smoking status: Former Smoker    Quit date: 03/01/1998    Years since quitting: 20.6  . Smokeless tobacco: Never Used  Substance Use Topics  . 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    Allergies  Allergen Reactions  . Indomethacin Other (See Comments)     Renal Insufficiency  . Pregabalin Other (See Comments)    DIZZINESS   . Sulfa  Antibiotics Hives  . Morphine And Related Nausea And Vomiting    Current Outpatient Medications  Medication Sig Dispense Refill  . albuterol (PROVENTIL HFA;VENTOLIN HFA) 108 (90 Base) MCG/ACT inhaler Inhale 2 puffs into the lungs every 6 (six) hours as needed for wheezing or shortness of breath. 1 Inhaler 2  . allopurinol (ZYLOPRIM) 100 MG tablet TAKE ONE TABLET BY MOUTH TWICE DAILY 60 tablet 3  . AMBULATORY NON FORMULARY MEDICATION Motorized scooter.  Diagnosis: Osteoarthritis M19.90 1 each 0  . Continuous Blood Gluc Sensor (FREESTYLE LIBRE 14 DAY SENSOR) MISC     . DULoxetine (CYMBALTA) 30 MG capsule TAKE ONE CAPSULE BY MOUTH EVERY NIGHT AT BEDTIME 90 capsule 1  . fluticasone (FLONASE) 50 MCG/ACT nasal spray Place 2 sprays into both nostrils  at bedtime. (Patient taking differently: Place 2 sprays into both nostrils as needed. )    . fluticasone (FLOVENT HFA) 110 MCG/ACT inhaler Inhale 2 puffs into the lungs 2 (two) times daily as needed (bronchitis).    . furosemide (LASIX) 20 MG tablet Take 1 tablet (20 mg total) by mouth 2 (two) times daily. 180 tablet 3  . Insulin Disposable Pump (V-GO 40) KIT Inject into the skin See admin instructions. Use with Novolog - 6 pumps before each meal    . Levothyroxine Sodium 150 MCG CAPS Take 1 capsule (150 mcg total) by mouth daily before breakfast. 30 capsule 3  . metoprolol succinate (TOPROL-XL) 25 MG 24 hr tablet TAKE ONE (1) TABLET BY MOUTH EACH DAY 90 tablet 1  . NOVOLOG 100 UNIT/ML injection 100 units per day in VGO unknown bolus    . Prenatal Vit-Fe Fumarate-FA (MULTIVITAMIN-PRENATAL) 27-0.8 MG TABS tablet Take 1 tablet by mouth daily. Takes for nail and hair growth     . psyllium (METAMUCIL SMOOTH TEXTURE) 58.6 % powder Take 1 packet by mouth daily. 283 g 12   No current facility-administered medications for this visit.     REVIEW OF SYSTEMS:  _0  denotes positive finding, _1  denotes negative finding Cardiac  Comments:  Chest pain or chest pressure:    Shortness of breath upon exertion: x   Short of breath when lying flat: x   Irregular heart rhythm: x       Vascular    Pain in calf, thigh, or hip brought on by ambulation: x   Pain in feet at night that wakes you up from your sleep:  x   Blood clot in your veins:    Leg swelling:  x       Pulmonary    Oxygen at home:    Productive cough:     Wheezing:         Neurologic    Sudden weakness in arms or legs:     Sudden numbness in arms or legs:     Sudden onset of difficulty speaking or slurred speech:    Temporary loss of vision in one eye:     Problems with dizziness:  x       Gastrointestinal    Blood in stool:     Vomited blood:         Genitourinary    Burning when urinating:     Blood in urine:         Psychiatric    Major depression:         Hematologic    Bleeding problems:    Problems with blood clotting too easily:        Skin  Rashes or ulcers:        Constitutional    Fever or chills:     PHYSICAL EXAM:   Vitals:   10/04/18 1342  BP: 114/68  Pulse: 75  Resp: 20  Temp: 97.7 F (36.5 C)  TempSrc: Temporal  SpO2: 96%  Weight: 235 lb (106.6 kg)  Height: _0  (1.575 m)   Body mass index is 42.98 kg/m.  GENERAL: The patient is a well-nourished female, in no acute distress. The vital signs are documented above. CARDIAC: There is a regular rate and rhythm.  VASCULAR: I do not detect carotid bruits. She does have femoral pulses. I cannot palpate popliteal or pedal pulses. She does have hyperpigmentation of both lower extremities. PULMONARY: There is good air exchange bilaterally without wheezing or rales. ABDOMEN: Soft and non-tender with normal pitched bowel sounds.  MUSCULOSKELETAL: She has had a previous right second toe amputation. NEUROLOGIC: No focal weakness or paresthesias are detected.   SKIN: There are no ulcers or rashes noted. PSYCHIATRIC: The patient has a normal affect.  DATA:    ARTERIAL DOPPLER STUDY: I have independently interpreted her arterial Doppler study today.  On the right side there is a dampened monophasic dorsalis pedis and posterior tibial signal with an ABI of 41%.  On the left side there is a monophasic dorsalis pedis and posterior tibial signal with an ABI of 66%.   MEDICAL ISSUES:   INFRAINGUINAL ARTERIAL OCCLUSIVE DISEASE: This patient has infrainguinal arterial occlusive disease bilaterally.  Currently she is not having significant symptoms on the left side.  However this is likely because her activity is very limited because of her weight and congestive heart failure.  On the left side she does have claudication and an ABI of 41%.  She is had previous atherectomy and angioplasty with peroneal runoff only on the right.  Her  vein is small on the right and she would not be a good candidate for surgery given her obesity with a large pannus and diabetes.  In addition she has significant congestive heart failure and cirrhosis.  I explained that if her symptoms progressed we could consider arteriography to see if there were any endovascular options on the right although this is unlikely.  I plan on seeing her back in 9 months.  I encouraged her to stay as active as possible.  We discussed the importance of nutrition.  Fortunately she is not a smoker.  She will call sooner if she has problems.  [name] [facility] [phone]

## 2018-10-06 ENCOUNTER — Other Ambulatory Visit: Payer: Self-pay | Admitting: *Deleted

## 2018-10-09 NOTE — Patient Outreach (Addendum)
  [facility] [facility]) Care Management Chronic Special Needs Program  10/09/2018  Name: Alexis [name] DOB: [birth year]  MRN: [id number]  Ms. Alexis [name] is enrolled in a chronic special needs plan for Diabetes. Reviewed and updated care plan.  Have been unable to contact patient via phone. She is listed as active with [place] but has not been seen yet.  Goals Addressed            This Visit's Progress   . Client understands the importance of follow-up with providers by attending scheduled visits   On track   . HEMOGLOBIN A1C < 7.0       Hg A1C= 7.8% on 08/31/18, improved form 8.0%    . Maintain timely refills of diabetic medication as prescribed within the year .   On track   . Obtain annual  Lipid Profile, LDL-C   On track   . Obtain Hemoglobin A1C at least 2 times per year   On track   . Visit Primary Care Provider or Endocrinologist at least 2 times per year    On track     Assessment: Client note meeting Hgb a1c of <7.0% as evidenced by most recent Hgb A1C= 7.8% on 08/31/18. Plan:  Send unsuccessful outreach letter with a copy of their individualized care plan to clinet  Chronic care management coordinator will outreach in:  3 Months     Kelli Churn RN, CCM, [place] Management Coordinator [facility] [phone]

## 2018-10-10 ENCOUNTER — Other Ambulatory Visit: Payer: Self-pay | Admitting: *Deleted

## 2018-10-10 ENCOUNTER — Encounter: Payer: Self-pay | Admitting: Hematology

## 2018-10-10 ENCOUNTER — Other Ambulatory Visit: Payer: Self-pay

## 2018-10-10 ENCOUNTER — Inpatient Hospital Stay: Payer: HMO

## 2018-10-10 ENCOUNTER — Encounter: Payer: Self-pay | Admitting: *Deleted

## 2018-10-10 ENCOUNTER — Inpatient Hospital Stay: Payer: HMO | Attending: Medical | Admitting: Hematology

## 2018-10-10 VITALS — HR 68 | Temp 97.0°F | Resp 19 | Ht 62.0 in

## 2018-10-10 DIAGNOSIS — E119 Type 2 diabetes mellitus without complications: Secondary | ICD-10-CM | POA: Insufficient documentation

## 2018-10-10 DIAGNOSIS — Z7951 Long term (current) use of inhaled steroids: Secondary | ICD-10-CM | POA: Insufficient documentation

## 2018-10-10 DIAGNOSIS — E785 Hyperlipidemia, unspecified: Secondary | ICD-10-CM | POA: Insufficient documentation

## 2018-10-10 DIAGNOSIS — I10 Essential (primary) hypertension: Secondary | ICD-10-CM | POA: Diagnosis not present

## 2018-10-10 DIAGNOSIS — Z87891 Personal history of nicotine dependence: Secondary | ICD-10-CM | POA: Diagnosis not present

## 2018-10-10 DIAGNOSIS — Z79899 Other long term (current) drug therapy: Secondary | ICD-10-CM | POA: Insufficient documentation

## 2018-10-10 DIAGNOSIS — D539 Nutritional anemia, unspecified: Secondary | ICD-10-CM | POA: Insufficient documentation

## 2018-10-10 DIAGNOSIS — D696 Thrombocytopenia, unspecified: Secondary | ICD-10-CM | POA: Insufficient documentation

## 2018-10-10 DIAGNOSIS — E039 Hypothyroidism, unspecified: Secondary | ICD-10-CM | POA: Diagnosis not present

## 2018-10-10 DIAGNOSIS — I252 Old myocardial infarction: Secondary | ICD-10-CM | POA: Diagnosis not present

## 2018-10-10 DIAGNOSIS — D72819 Decreased white blood cell count, unspecified: Secondary | ICD-10-CM | POA: Insufficient documentation

## 2018-10-10 DIAGNOSIS — Z950 Presence of cardiac pacemaker: Secondary | ICD-10-CM | POA: Insufficient documentation

## 2018-10-10 LAB — CBC WITH DIFFERENTIAL (CANCER CENTER ONLY)
Abs Immature Granulocytes: 0.01 10*3/uL (ref 0.00–0.07)
Basophils Absolute: 0 10*3/uL (ref 0.0–0.1)
Basophils Relative: 0 %
Eosinophils Absolute: 0.1 10*3/uL (ref 0.0–0.5)
Eosinophils Relative: 2 %
HCT: 35.4 % — ABNORMAL LOW (ref 36.0–46.0)
Hemoglobin: 10.5 g/dL — ABNORMAL LOW (ref 12.0–15.0)
Immature Granulocytes: 0 %
Lymphocytes Relative: 17 %
Lymphs Abs: 0.7 10*3/uL (ref 0.7–4.0)
MCH: 30.1 pg (ref 26.0–34.0)
MCHC: 29.7 g/dL — ABNORMAL LOW (ref 30.0–36.0)
MCV: 101.4 fL — ABNORMAL HIGH (ref 80.0–100.0)
Monocytes Absolute: 0.4 10*3/uL (ref 0.1–1.0)
Monocytes Relative: 11 %
Neutro Abs: 2.6 10*3/uL (ref 1.7–7.7)
Neutrophils Relative %: 70 %
Platelet Count: 66 10*3/uL — ABNORMAL LOW (ref 150–400)
RBC: 3.49 MIL/uL — ABNORMAL LOW (ref 3.87–5.11)
RDW: 16.8 % — ABNORMAL HIGH (ref 11.5–15.5)
WBC Count: 3.8 10*3/uL — ABNORMAL LOW (ref 4.0–10.5)
nRBC: 0 % (ref 0.0–0.2)

## 2018-10-10 LAB — CMP (CANCER CENTER ONLY)
ALT: 22 U/L (ref 0–44)
AST: 40 U/L (ref 15–41)
Albumin: 3.7 g/dL (ref 3.5–5.0)
Alkaline Phosphatase: 145 U/L — ABNORMAL HIGH (ref 38–126)
Anion gap: 7 (ref 5–15)
BUN: 21 mg/dL (ref 8–23)
CO2: 33 mmol/L — ABNORMAL HIGH (ref 22–32)
Calcium: 9.7 mg/dL (ref 8.9–10.3)
Chloride: 98 mmol/L (ref 98–111)
Creatinine: 0.76 mg/dL (ref 0.44–1.00)
GFR, Est AFR Am: >60 mL/min (ref >60)
GFR, Estimated: >60 mL/min (ref >60)
Glucose, Bld: 182 mg/dL — ABNORMAL HIGH (ref 70–99)
Potassium: 4 mmol/L (ref 3.5–5.1)
Sodium: 138 mmol/L (ref 135–145)
Total Bilirubin: 0.8 mg/dL (ref 0.3–1.2)
Total Protein: 7.3 g/dL (ref 6.5–8.1)

## 2018-10-10 LAB — SAVE SMEAR(SSMR), FOR PROVIDER SLIDE REVIEW

## 2018-10-10 NOTE — Patient Outreach (Addendum)
  [place] [facility]) Care Management Chronic Special Needs Program    10/10/2018  Name: Alexis [name], DOB: 01/21/1951  MRN: [id number]   [name] is enrolled in a chronic special needs plan for Diabetes and Heart Failure.  Interdisciplinary care team note: Per e-mail direction from assistant director of [place], secure e-mail sent to [facility] with update including: this RNCM's inability to reach client via phone and outreach letter; 3 emergency department visits (without acute admission) to [facility] in July for c/o dyspnea including the  emergency department treatment provide to client at each visit.  Received reply via secure e-mail from [place] stating that client is not currently engaged with [facility] and that she is sending the update over to the outreach team today.  Kelli Churn RN, CCM, [facility] [phone]

## 2018-10-10 NOTE — Progress Notes (Signed)
[facility] OFFICE PROGRESS NOTE  Patient Care Team: Saguier, Iris Pert as PCP - General (Internal Medicine) Constance Haw, MD as PCP - Electrophysiology (Cardiology) Stanford Breed Denice Bors, MD as PCP - Cardiology (Cardiology) Riccardo Dubin, PA-C (Internal Medicine) Elayne Guerin, [facility] as [place] [facility] (Pharmacist) Barrington Ellison, RN as [place] Management  HEME/ONC OVERVIEW: 1. Macrocytic anemia -Hgb fluctuates widely between 11 and 14 since at least 2017 -Acute decline to mid-8's since 07/2018, ? GI bleeding   2. Thrombocytopenia -Likely due to cirrhosis  -Plts fluctuates between 70-100's since at least 2017 -Nutritional, viral and coag studies unremarkable   PERTINENT NON-HEM/ONC PROBLEMS: 1. 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 HFrEF (LVEF ~40% on TTE in 09/2018)  ASSESSMENT & PLAN:   Macrocytic anemia -Likely multifactorial, including underlying liver disease and suspected occult GI bleeding -Clinically, patient still has intermittent melena, and with evidence of cirrhosis on recent imaging study, she is at high risk for bleeding from varices and portal gastropathy -Recent iron profile c/w iron deficiency, further suggesting ongoing GI bleeding  -Hgb 10.5 today, improving  -She was scheduled to see GI on 09/11/2018 but did not make the appt  -I have strongly recommended the patient to be seen by GI ASAP to rule out ongoing GI bleeding -I have sent a message to Dr. Lyndel Safe of GI to re-schedule her appt and coordinate her follow-up   Thrombocytopenia -Likely due to underlying cirrhosis -Plts between 70-100k's since 2017 -I independently reviewed the radiologic images of recent CT abdomen/pelvis in late 08/2018, and agree with the findings as documented -In summary, CT showed diverticulosis, nodular contour of the liver and splenomegaly, consistent with cirrhosis and congestive  splenomegaly -Plts 66k today -Overall, the thrombocytopenia has been stable, and I do not see any indication for further work-up at this time, such as bone marrow bx, unless her cytopenias become progressively worse  -I counseled the patient on any concerning symptoms, including abnormal bleeding or excess bruising, for which she should seek further evaluation   Leukopenia -Likely secondary to underlying cirrhosis, with bone marrow suppression from hx of heavy EtOH abuse -WBC 3.8k w/ ANC 2600, stable -Clinically, patient denies any symptoms of infection -We will monitor ti for now   Recent Melena -GI follow-up scheduled on 09/11/2018 but patient cancelled -She has not had any recurrent melena for a few weeks -I have reached out to GI to re-schedule appt ASAP, and instructed the patient to go to the ER if she develops worsening melena, hematochezia, hematemesis or persistent abdominal pain  Hypothyroidism -Patient is currently on Synthroid 128mg daily -Review of her labs showed that her thyroid function has not been checked since 03/2017 -As hypothyroidism can also lead to macrocytosis, I have ordered repeat thyroid function studies for the next visit   Orders Placed This Encounter  Procedures  . CBC with Differential (Cancer Center Only)    Standing Status:   Future    Standing Expiration Date:   11/14/2019  . [place]    Standing Status:   Future    Standing Expiration Date:   11/14/2019  . Save Smear (SSMR)    Standing Status:   Future    Standing Expiration Date:   10/10/2019   All questions were answered. The patient knows to call the clinic with any problems, questions or concerns. No barriers to learning was detected.  Return in 2 months  for labs and clinic follow-up.  Tish Men, MD 10/10/2018 2:35 PM  CHIEF COMPLAINT: "I am doing okay"  INTERVAL HISTORY: Ms. Marte returns clinic for follow-up of pancytopenia.  She has had 2 recent ER visits for  shortness of breath related to congestive heart failure.  She had several days of black stool and one episode of bright red blood mixed with stool, but has not had any recurrent melena or hematochezia for 3 weeks.  She was scheduled to have a telemedicine visit with gastroenterology in July 2020, but said that she was waiting for the phone call but no one called her.  She still has mild persistent exertional dyspnea, but denies any chest pain, diaphoresis, or shortness of breath at rest.  She denies any abnormal bleeding or excess bruising.  She denies any constitutional symptoms.  REVIEW OF SYSTEMS:   Constitutional: ( - ) fevers, ( - )  chills , ( - ) night sweats Eyes: ( - ) blurriness of vision, ( - ) double vision, ( - ) watery eyes Ears, nose, mouth, throat, and face: ( - ) mucositis, ( - ) sore throat Respiratory: ( - ) cough, ( + ) dyspnea, ( - ) wheezes Cardiovascular: ( - ) palpitation, ( - ) chest discomfort, ( - ) lower extremity swelling Gastrointestinal:  ( - ) nausea, ( - ) heartburn, ( - ) change in bowel habits Skin: ( - ) abnormal skin rashes Lymphatics: ( - ) new lymphadenopathy, ( - ) easy bruising Neurological: ( - ) numbness, ( - ) tingling, ( - ) new weaknesses Behavioral/Psych: ( - ) mood change, ( - ) new changes  All other systems were reviewed with the patient and are negative.  I have reviewed the past medical history, past surgical history, social history and family history with the patient and they are unchanged from previous note.  ALLERGIES:  is allergic to indomethacin; pregabalin; sulfa antibiotics; and morphine and related.  MEDICATIONS:  Current Outpatient Medications  Medication Sig Dispense Refill  . albuterol (PROVENTIL HFA;VENTOLIN HFA) 108 (90 Base) MCG/ACT inhaler Inhale 2 puffs into the lungs every 6 (six) hours as needed for wheezing or shortness of breath. 1 Inhaler 2  . allopurinol (ZYLOPRIM) 100 MG tablet TAKE ONE TABLET BY MOUTH TWICE DAILY 60  tablet 3  . AMBULATORY NON FORMULARY MEDICATION Motorized scooter.  Diagnosis: Osteoarthritis M19.90 1 each 0  . Continuous Blood Gluc Sensor (FREESTYLE LIBRE 14 DAY SENSOR) MISC     . DULoxetine (CYMBALTA) 30 MG capsule TAKE ONE CAPSULE BY MOUTH EVERY NIGHT AT BEDTIME 90 capsule 1  . fluticasone (FLONASE) 50 MCG/ACT nasal spray Place 2 sprays into both nostrils at bedtime. (Patient taking differently: Place 2 sprays into both nostrils as needed. )    . fluticasone (FLOVENT HFA) 110 MCG/ACT inhaler Inhale 2 puffs into the lungs 2 (two) times daily as needed (bronchitis).    . furosemide (LASIX) 20 MG tablet Take 1 tablet (20 mg total) by mouth 2 (two) times daily. 180 tablet 3  . Levothyroxine Sodium 150 MCG CAPS Take 1 capsule (150 mcg total) by mouth daily before breakfast. 30 capsule 3  . metoprolol succinate (TOPROL-XL) 25 MG 24 hr tablet TAKE ONE (1) TABLET BY MOUTH EACH DAY 90 tablet 1  . NOVOLOG 100 UNIT/ML injection 100 units per day in VGO unknown bolus    . Prenatal Vit-Fe Fumarate-FA (MULTIVITAMIN-PRENATAL) 27-0.8 MG TABS tablet Take 1 tablet by mouth daily. Takes for nail and  hair growth     . psyllium (METAMUCIL SMOOTH TEXTURE) 58.6 % powder Take 1 packet by mouth daily. 283 g 12   No current facility-administered medications for this visit.     PHYSICAL EXAMINATION: ECOG PERFORMANCE STATUS: 2 - Symptomatic, <50% confined to bed  Today's Vitals   10/10/18 1425  Pulse: 68  Resp: 19  Temp: (!) 97 F (36.1 C)  TempSrc: Temporal  SpO2: 100%  Height: 5' 2"  (1.575 m)  PainSc: 0-No pain   Body mass index is 42.98 kg/m.  Filed Weights    GENERAL: alert, no distress, very obese  SKIN: chronic venous stasis changes in the bilateral lower extremities EYES: conjunctiva are pink and non-injected, sclera clear OROPHARYNX: no exudate, no erythema; lips, buccal mucosa, and tongue normal  NECK: supple, non-tender LUNGS: clear to auscultation with normal breathing effort HEART:  regular rate & rhythm and no murmurs and 1+ bilateral lower extremity edema ABDOMEN: soft, non-tender, non-distended, normal bowel sounds Musculoskeletal: no cyanosis of digits and no clubbing  PSYCH: alert & oriented x 3, fluent speech  LABORATORY DATA:  I have reviewed the data as listed    Component Value Date/Time   NA 142 09/26/2018 1650   K 4.1 09/26/2018 1650   CL 97 09/26/2018 1650   CO2 28 09/26/2018 1650   GLUCOSE 217 (H) 09/26/2018 1650   GLUCOSE 249 (H) 09/22/2018 1310   BUN 22 09/26/2018 1650   CREATININE 0.87 09/26/2018 1650   CREATININE 1.03 (H) 09/08/2018 1137   CALCIUM 9.5 09/26/2018 1650   PROT 7.1 09/08/2018 1137   PROT 7.3 05/10/2018 0944   ALBUMIN 3.9 09/08/2018 1137   ALBUMIN 4.0 05/10/2018 0944   AST 39 09/08/2018 1137   ALT 23 09/08/2018 1137   ALKPHOS 140 (H) 09/08/2018 1137   BILITOT 0.7 09/08/2018 1137   GFRNONAA 68 09/26/2018 1650   GFRNONAA 55 (L) 09/08/2018 1137   GFRAA 79 09/26/2018 1650   GFRAA >60 09/08/2018 1137    No results found for: SPEP, UPEP  Lab Results  Component Value Date   WBC 3.8 (L) 10/10/2018   NEUTROABS 2.6 10/10/2018   HGB 10.5 (L) 10/10/2018   HCT 35.4 (L) 10/10/2018   MCV 101.4 (H) 10/10/2018   PLT 66 (L) 10/10/2018      Chemistry      Component Value Date/Time   NA 142 09/26/2018 1650   K 4.1 09/26/2018 1650   CL 97 09/26/2018 1650   CO2 28 09/26/2018 1650   BUN 22 09/26/2018 1650   CREATININE 0.87 09/26/2018 1650   CREATININE 1.03 (H) 09/08/2018 1137   GLU 6 09/02/2014      Component Value Date/Time   CALCIUM 9.5 09/26/2018 1650   ALKPHOS 140 (H) 09/08/2018 1137   AST 39 09/08/2018 1137   ALT 23 09/08/2018 1137   BILITOT 0.7 09/08/2018 1137       RADIOGRAPHIC STUDIES: I have personally reviewed the radiological images as listed below and agreed with the findings in the report. Ct Abdomen Pelvis W Contrast  Result Date: 09/28/2018 CLINICAL DATA:  Melena, chronic anemia, rule out GI bleeding,  rule out liver disease EXAM: CT ABDOMEN AND PELVIS WITH CONTRAST TECHNIQUE: Multidetector CT imaging of the abdomen and pelvis was performed using the standard protocol following bolus administration of intravenous contrast. CONTRAST:  159m OMNIPAQUE IOHEXOL 300 MG/ML SOLN, additional oral enteric contrast COMPARISON:  None. FINDINGS: Lower chest: Cardiomegaly. Coronary artery calcifications and/or stents. Partially imaged pacer leads. Hepatobiliary: Coarse, nodular  contour of liver. Gallstone near the gallbladder neck. Gallbladder wall thickening and trace pericholecystic fluid. No biliary ductal dilatation. Pancreas: Unremarkable. No pancreatic ductal dilatation or surrounding inflammatory changes. Spleen: Splenomegaly, maximum span 15.6 cm. Heterogeneous perfusion. Adrenals/Urinary Tract: Adrenal glands are unremarkable. Kidneys are normal, without renal calculi, solid lesion, or hydronephrosis. Bladder is unremarkable. Stomach/Bowel: Stomach is within normal limits. Appendix appears normal. No evidence of bowel wall thickening, distention, or inflammatory changes. Sigmoid diverticulosis. Vascular/Lymphatic: Aortic atherosclerosis. No enlarged abdominal or pelvic lymph nodes. Reproductive: Status post hysterectomy Other: Anasarca.  Trace ascites. Musculoskeletal: No acute or significant osseous findings. IMPRESSION: 1. Sigmoid diverticulosis. There are no findings of the abdomen or pelvis to specifically localize source of GI bleeding. Please note that in generally administration of oral enteric contrast significantly limits evaluation for GI bleeding. 2.  Stigmata of cirrhosis and splenomegaly.  Trace ascites. 3. Gallstone near the gallbladder neck. Gallbladder wall thickening and trace pericholecystic fluid. No biliary ductal dilatation. The presence of gallbladder wall thickening is nonspecific in the setting of cirrhosis and ascites. HIDA may be used to further evaluate for patency of the cystic duct acute  cholecystitis is suspected. Electronically Signed   By: Eddie Candle M.D.   On: 09/28/2018 08:41   Dg Chest Portable 1 View  Result Date: 09/22/2018 CLINICAL DATA:  Shortness of breath. EXAM: PORTABLE CHEST 1 VIEW COMPARISON:  09/09/2018 FINDINGS: Cardiomegaly and LEFT-sided pacemaker again noted. Pulmonary vascular congestion is present. There is no evidence of focal airspace disease, pulmonary edema, suspicious pulmonary nodule/mass, pleural effusion, or pneumothorax. No acute bony abnormalities are identified. IMPRESSION: Cardiomegaly with pulmonary vascular congestion. Electronically Signed   By: Margarette Canada M.D.   On: 09/22/2018 12:45   Vas [place] Burnard Bunting With/wo Tbi  Result Date: 10/04/2018 LOWER EXTREMITY DOPPLER STUDY Indications: Peripheral vascular disease. High Risk Factors: Hypertension, hyperlipidemia, Diabetes, past history of                    smoking, prior MI.  Vascular Interventions: 04/29/2016: Jetstream atherectomy of right SFA and                         popliteal arteries; Drug coated balloon angioplasty of                         Right sfa and popliteal arteries with 16m impact. Performing Technologist: PBurley SaverRVT  Examination Guidelines: A complete evaluation includes at minimum, Doppler waveform signals and systolic blood pressure reading at the level of bilateral brachial, anterior tibial, and posterior tibial arteries, when vessel segments are accessible. Bilateral testing is considered an integral part of a complete examination. Photoelectric Plethysmograph (PPG) waveforms and toe systolic pressure readings are included as required and additional duplex testing as needed. Limited examinations for reoccurring indications may be performed as noted.  ABI Findings: +---------+------------------+-----+-------------------+--------+ Right    Rt Pressure (mmHg)IndexWaveform           Comment  +---------+------------------+-----+-------------------+--------+ Brachial 139                                                 +---------+------------------+-----+-------------------+--------+ PTA      57                0.41 dampened monophasic         +---------+------------------+-----+-------------------+--------+  DP       19                0.14 dampened monophasic         +---------+------------------+-----+-------------------+--------+ Great Toe0                 0.00 Abnormal           Absent   +---------+------------------+-----+-------------------+--------+ +---------+----------------+-----+----------+---------------------------------+ Left     Lt Pressure     IndexWaveform  Comment                                    (mmHg)                                                           +---------+----------------+-----+----------+---------------------------------+ Brachial 138                                                              +---------+----------------+-----+----------+---------------------------------+ PTA      92              0.66 monophasic                                  +---------+----------------+-----+----------+---------------------------------+ DP       62              0.45 monophasic                                  +---------+----------------+-----+----------+---------------------------------+ Great Toe0               0.00 Abnormal  unable to obtain due to low                                               amplitude                         +---------+----------------+-----+----------+---------------------------------+ +-------+-----------+-----------+------------+------------+ ABI/TBIToday's ABIToday's TBIPrevious ABIPrevious TBI +-------+-----------+-----------+------------+------------+ Right  0.41       0          0.87        0.00         +-------+-----------+-----------+------------+------------+ Left   0.66       0          0.54        0.00          +-------+-----------+-----------+------------+------------+ Right ABIs appear decreased compared to prior study on 04/20/2017. Left ABIs appear essentially unchanged.  Summary: Right: Resting right ankle-brachial index indicates severe right lower extremity arterial disease. The right toe-brachial index is abnormal. RT great toe pressure = 0 mmHg. Left: Resting left ankle-brachial index indicates moderate left lower extremity arterial disease. The left toe-brachial index is abnormal. LT Great toe pressure =  0 mmHg. PPG tracings appear dampened.  *See table(s) above for measurements and observations.  Electronically signed by [name] MD on 10/04/2018 at 1:52:38 PM.    Final

## 2018-10-10 NOTE — Patient Outreach (Signed)
  [facility]) Care Management Chronic Special Needs Program    10/10/2018  Name: Alexis [name], DOB: Feb 27, 1951  MRN: [id number]   Alexis [name] is enrolled in a chronic special needs plan for Diabetes and heart failure.   This RNCM has been unable to contact client via phone and she did not respond to an unsuccessful outreach letter sent to her on 05/18/18. She did complete a health risk assessment on 05/24/18.  Plan:  Will send unsuccessful outreach letter to client with a copy of her updated individulaized care plan. If no response from client from letter send today, chronic care management coordinator will outreach to client in 3 months.   Kelli Churn RN, CCM, [facility] [phone]

## 2018-10-11 LAB — LACTATE DEHYDROGENASE: LDH: 220 U/L — ABNORMAL HIGH (ref 98–192)

## 2018-10-16 ENCOUNTER — Ambulatory Visit: Payer: HMO | Admitting: Medical

## 2018-10-16 ENCOUNTER — Telehealth: Payer: Self-pay

## 2018-10-16 DIAGNOSIS — Z0289 Encounter for other administrative examinations: Secondary | ICD-10-CM

## 2018-10-16 NOTE — Telephone Encounter (Signed)
LVM for patient to call me back 

## 2018-10-17 ENCOUNTER — Encounter: Payer: Self-pay | Admitting: Gastroenterology

## 2018-10-17 ENCOUNTER — Ambulatory Visit (INDEPENDENT_AMBULATORY_CARE_PROVIDER_SITE_OTHER): Payer: HMO | Admitting: Gastroenterology

## 2018-10-17 ENCOUNTER — Other Ambulatory Visit: Payer: Self-pay

## 2018-10-17 VITALS — BP 120/76 | HR 72 | Temp 98.2°F | Ht 62.0 in | Wt 233.4 lb

## 2018-10-17 DIAGNOSIS — R1013 Epigastric pain: Secondary | ICD-10-CM | POA: Diagnosis not present

## 2018-10-17 DIAGNOSIS — K921 Melena: Secondary | ICD-10-CM

## 2018-10-17 DIAGNOSIS — K7460 Unspecified cirrhosis of liver: Secondary | ICD-10-CM

## 2018-10-17 MED ORDER — RABEPRAZOLE SODIUM 20 MG PO TBEC: 20.0000 mg | DELAYED_RELEASE_TABLET | Freq: Every day | ORAL | 11 refills | Status: DC

## 2018-10-17 NOTE — Progress Notes (Signed)
Chief Complaint: Abnormal CT, melena  Referring Provider:  Mackie Pai, PA-C      ASSESSMENT AND PLAN;   #1.  Liver cirrhosis d/t  R sided CHF, NASH. R/O other etiologies (no ETOH x 20 yrs). Dx on CT 08/2018, with portal hypertension in form of splenomegaly with hypersplenism. Trace ascites. No definite varices or clinical HE.  Has pancytopenia.  #2. Epigastric pain. ? Etiology.  Has gallstones.  History doesn't sound like biliary colic. H/O HH and had responded well to AcipHex in the past.  #3. ?melena but on iron supplements. Hb stable.  #4.  Comorbid conditions include dCHF, PVD, HTN, A. fib, HLD, PAD, DM 2, CAD, pacemaker and obesity.   Plan: - Aciphex 71m po qd 30, 11 refiils. - Check acute viral hepatitis, autoimmune hepatitis panel (AMA, ASMA and anti-LKM), ANA, iron studies, serum ceruloplasmin, A1AT, celiac screen and GGT. -Check ammonia, alpha-fetoprotein and PT INR. -Check anti-HAV total Ab and HBsAb.  If negative, would recommend vaccination for hepatitis A and B. -Hemoccult cards x 3. -[facility]  -Refusing colon (can't take prep).  She does understand small but definite risks of missing colorectal neoplasms. -CBC, CMP at time of EGD. -Continue current dose of Lasix (436mpo qd) as per cardiology. -Salt restricted diet.    HPI:    SaSHANDRIA CLINCHs a [age].o. female  With newly diagnosed liver cirrhosis on CT Seen by Dr. ZhMaylon Peppershe has been having questionable melena (has been taking iron supplements as well) for a few weeks which has resolved per patient.  She currently has "normal" dark stools.  Does complain of epigastric pain.  No nausea or vomiting.  Also has heartburn.  She was given trial of Protonix which she could not tolerate.  She has done well in the past with AcipHex.  No odynophagia or dysphagia.  No jaundice dark urine or pale stools.  She has history of alcohol abuse in the past until her 48th birthday (when she got  really drunk with girls).  After that she did not drink any alcohol.  She is alcohol free for last 20 years.  To me she denies having any history of hematochezia.  Has more constipation but is better on Metamucil.  No change in mental status.  Has history of easy bruisability.  No history of itching, skin lesions, easy bruisability, intake of over-the-counter medications including diet pills, herbal medications, anabolic steroids or Tylenol.  There is no history of blood transfusions, IV drug use or family history of liver disease.  No jaundice dark urine or pale stools.  Recently seen by cardiology and vascular surgery as well.  Has chronic mild shortness of breath.  Never had colonoscopy.  She would like to hold off on colonoscopy.  She had EGD over 20 years ago which according to the patient showed hiatal hernia.  Did very well with AcipHex at that time.  No nonsteroidals.   2DE 09/2018: (Dr K)Raliegh Ip1. The right ventricle has moderately reduced systolic function. The cavity was mildly enlarged. 2. Right atrial size was severely dilated. 3. Small pericardial effusion. 4. EF 40%  CT AP with contrast 09/28/2018: 1. Sigmoid diverticulosis. 2.  Stigmata of cirrhosis and splenomegaly (15.6 cm).  Trace ascites. 3. Gallstone near the gallbladder neck Past Medical History:  Diagnosis Date   Allergy    Anxiety    Arthritis    Asthma    Atrial fibrillation (HCC)    CHF (congestive  heart failure) (HCC)    Cirrhosis ([place])    COPD (chronic obstructive pulmonary disease) ([place])    Diabetes mellitus without complication ([place])    Hyperlipidemia    Hypertension    Macrocytic anemia 09/04/2018   Myocardial infarction [facility])    several   Pacemaker    CRT-P with RV lead and His Bundle lead ( high threshold)    Peripheral neuropathy    Pneumonia    PONV (postoperative nausea and vomiting)    unsure of exactly what happened at [facility] in 2010 (knee surgery) that led to  crital care   Thyroid disease     Past Surgical History:  Procedure Laterality Date   ABDOMINAL AORTOGRAM W/LOWER EXTREMITY N/A 04/29/2016   Procedure: Abdominal Aortogram w/Lower Extremity;  Surgeon: Waynetta [place], MD;  Location: [place] [facility];  Service: Cardiovascular;  Laterality: N/A;   ABDOMINAL AORTOGRAM W/LOWER EXTREMITY N/A 05/06/2017   Procedure: ABDOMINAL AORTOGRAM W/LOWER EXTREMITY;  Surgeon: Angelia Mould, MD;  Location: [place] [facility];  Service: Cardiovascular;  Laterality: N/A;  bilateral   ABDOMINAL HYSTERECTOMY     AMPUTATION Right 07/30/2016   Procedure: AMPUTATION RIGHT SECOND TOE;  Surgeon: Angelia Mould, MD;  Location: [facility] OR;  Service: Vascular;  Laterality: Right;   CARDIAC CATHETERIZATION     2005 at [place] Right 04/29/2016   Procedure: Peripheral Vascular Atherectomy-Right Popliteal;  Surgeon: Waynetta [place], MD;  Location: [place] [facility];  Service: Cardiovascular;  Laterality: Right;   PERIPHERAL VASCULAR INTERVENTION Right 04/29/2016   Procedure: Peripheral Vascular Intervention-Right Popliteal;  Surgeon: Waynetta [place], MD;  Location: [place] [facility];  Service: Cardiovascular;  Laterality: Right;  POPLITEAL PTA   RADIOFREQUENCY ABLATION     TOTAL KNEE ARTHROPLASTY     TUBAL LIGATION      Family History  Problem Relation Age of Onset   Diabetes Mother    Hyperlipidemia Mother    Diabetes Father    Hyperlipidemia Father    Heart disease Father        before age 60   Heart attack Father    Diabetes Brother    Hyperlipidemia Brother    Heart attack Brother     Social History   Tobacco Use   Smoking status: Former Smoker    Quit date: 03/01/1998    Years since quitting: 20.6   Smokeless tobacco: Never Used  Substance Use Topics   Alcohol use: No    Alcohol/week: 0.0 standard drinks    Comment: Previous hx: recovering alcoholic.   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   Drug use: Yes    Types: Marijuana    Comment: remote use    Current Outpatient Medications  Medication Sig Dispense Refill   albuterol (PROVENTIL HFA;VENTOLIN HFA) 108 (90 Base) MCG/ACT inhaler Inhale 2 puffs into the lungs every 6 (six) hours as needed for wheezing or shortness of breath. 1 Inhaler 2   allopurinol (ZYLOPRIM) 100 MG tablet TAKE ONE TABLET BY MOUTH TWICE DAILY 60 tablet 3   AMBULATORY NON FORMULARY MEDICATION Motorized scooter.  Diagnosis: Osteoarthritis M19.90 1 each 0   Continuous Blood Gluc Sensor (FREESTYLE LIBRE 14 DAY SENSOR) MISC      DULoxetine (CYMBALTA) 30 MG capsule TAKE ONE CAPSULE BY MOUTH EVERY NIGHT AT BEDTIME 90 capsule 1   fluticasone (FLONASE) 50 MCG/ACT nasal spray Place 2 sprays into both nostrils at bedtime. (Patient taking differently: Place 2 sprays into  both nostrils as needed. )     fluticasone (FLOVENT HFA) 110 MCG/ACT inhaler Inhale 2 puffs into the lungs 2 (two) times daily as needed (bronchitis).     furosemide (LASIX) 20 MG tablet Take 1 tablet (20 mg total) by mouth 2 (two) times daily. 180 tablet 3   Levothyroxine Sodium 150 MCG CAPS Take 1 capsule (150 mcg total) by mouth daily before breakfast. 30 capsule 3   metoprolol succinate (TOPROL-XL) 25 MG 24 hr tablet TAKE ONE (1) TABLET BY MOUTH EACH DAY 90 tablet 1   NOVOLOG 100 UNIT/ML injection 100 units per day in VGO unknown bolus     Prenatal Vit-Fe Fumarate-FA (MULTIVITAMIN-PRENATAL) 27-0.8 MG TABS tablet Take 1 tablet by mouth daily. Takes for nail and hair growth      psyllium (METAMUCIL SMOOTH TEXTURE) 58.6 % powder Take 1 packet by mouth daily. 283 g 12   No current facility-administered medications for this visit.     Allergies  Allergen Reactions   Indomethacin Other (See Comments)     Renal Insufficiency   Pregabalin Other (See Comments)    DIZZINESS    Sulfa Antibiotics Hives   Morphine And Related Nausea And Vomiting    Review of  Systems:  Constitutional: Denies fever, chills, diaphoresis, appetite change and has fatigue.  HEENT: Denies photophobia, eye pain, redness, hearing loss, ear pain, congestion, sore throat, rhinorrhea, sneezing, mouth sores, neck pain, neck stiffness and tinnitus.   Respiratory: Has SOB, DOE, cough, No chest tightness/wheezing.   Cardiovascular: Denies chest pain, palpitations and leg swelling.  Genitourinary: Denies dysuria, urgency, frequency, hematuria, flank pain and difficulty urinating.  Musculoskeletal: Has myalgias, back pain, joint swelling, arthralgias and gait problem.  Skin: No rash.  Neurological: Denies dizziness, seizures, syncope, weakness, light-headedness, numbness and headaches.  Hematological: Denies adenopathy. Has Easy bruising. Psychiatric/Behavioral: Has anxiety or depression     Physical Exam:    BP 120/76    Pulse 72    Temp 98.2 F (36.8 C)    Ht 5' 2"  (1.575 m)    Wt 233 lb 6 oz (105.9 kg)    BMI 42.68 kg/m  Filed Weights   10/17/18 0931  Weight: 233 lb 6 oz (105.9 kg)   Constitutional:  Well-developed, in no acute distress. Psychiatric: Normal mood and affect. Behavior is normal. HEENT: Pupils normal.  Conjunctivae are normal. No scleral icterus. Neck supple.  Cardiovascular: Normal rate, regular rhythm. No edema Pulmonary/chest: Effort normal and breath sounds-decreased. no wheezing, rales or rhonchi. Abdominal: Soft, nondistended. Nontender. Bowel sounds active throughout. There are no masses palpable. No hepatomegaly. Rectal:  defered Neurological: Alert and oriented to person place and time. Skin: Skin is warm and dry. No rashes noted.  Data Reviewed: I have personally reviewed following labs and imaging studies  CBC: CBC Latest Ref Rng & Units 10/10/2018 09/26/2018 09/22/2018  WBC 4.0 - 10.5 K/uL 3.8(L) 3.6 3.1(L)  Hemoglobin 12.0 - 15.0 g/dL 10.5(L) 9.5(L) 9.4(L)  Hematocrit 36.0 - 46.0 % 35.4(L) 29.7(L) 32.3(L)  Platelets 150 - 400 K/uL 66(L)  75(LL) 76(L)    CMP: CMP Latest Ref Rng & Units 10/10/2018 09/26/2018 09/22/2018  Glucose 70 - 99 mg/dL 182(H) 217(H) 249(H)  BUN 8 - 23 mg/dL 21 22 23   Creatinine 0.44 - 1.00 mg/dL 0.76 0.87 1.14(H)  Sodium 135 - 145 mmol/L 138 142 136  Potassium 3.5 - 5.1 mmol/L 4.0 4.1 3.4(L)  Chloride 98 - 111 mmol/L 98 97 95(L)  CO2 22 - 32 mmol/L 33(H)  28 28  Calcium 8.9 - 10.3 mg/dL 9.7 9.5 9.2  Total Protein 6.5 - 8.1 g/dL 7.3 - -  Total Bilirubin 0.3 - 1.2 mg/dL 0.8 - -  Alkaline Phos 38 - 126 U/L 145(H) - -  AST 15 - 41 U/L 40 - -  ALT 0 - 44 U/L 22 - -    GFR: Estimated Creatinine Clearance: 75.9 mL/min (by C-G formula based on SCr of 0.76 mg/dL). Liver Function Tests: Recent Labs  Lab 10/10/18 1403  AST 40  ALT 22  ALKPHOS 145*  BILITOT 0.8  PROT 7.3  ALBUMIN 3.7      Radiology Studies: Ct Abdomen Pelvis W Contrast  Result Date: 09/28/2018 CLINICAL DATA:  Melena, chronic anemia, rule out GI bleeding, rule out liver disease EXAM: CT ABDOMEN AND PELVIS WITH CONTRAST TECHNIQUE: Multidetector CT imaging of the abdomen and pelvis was performed using the standard protocol following bolus administration of intravenous contrast. CONTRAST:  159m OMNIPAQUE IOHEXOL 300 MG/ML SOLN, additional oral enteric contrast COMPARISON:  None. FINDINGS: Lower chest: Cardiomegaly. Coronary artery calcifications and/or stents. Partially imaged pacer leads. Hepatobiliary: Coarse, nodular contour of liver. Gallstone near the gallbladder neck. Gallbladder wall thickening and trace pericholecystic fluid. No biliary ductal dilatation. Pancreas: Unremarkable. No pancreatic ductal dilatation or surrounding inflammatory changes. Spleen: Splenomegaly, maximum span 15.6 cm. Heterogeneous perfusion. Adrenals/Urinary Tract: Adrenal glands are unremarkable. Kidneys are normal, without renal calculi, solid lesion, or hydronephrosis. Bladder is unremarkable. Stomach/Bowel: Stomach is within normal limits. Appendix appears  normal. No evidence of bowel wall thickening, distention, or inflammatory changes. Sigmoid diverticulosis. Vascular/Lymphatic: Aortic atherosclerosis. No enlarged abdominal or pelvic lymph nodes. Reproductive: Status post hysterectomy Other: Anasarca.  Trace ascites. Musculoskeletal: No acute or significant osseous findings. IMPRESSION: 1. Sigmoid diverticulosis. There are no findings of the abdomen or pelvis to specifically localize source of GI bleeding. Please note that in generally administration of oral enteric contrast significantly limits evaluation for GI bleeding. 2.  Stigmata of cirrhosis and splenomegaly.  Trace ascites. 3. Gallstone near the gallbladder neck. Gallbladder wall thickening and trace pericholecystic fluid. No biliary ductal dilatation. The presence of gallbladder wall thickening is nonspecific in the setting of cirrhosis and ascites. HIDA may be used to further evaluate for patency of the cystic duct acute cholecystitis is suspected. Electronically Signed   By: AEddie CandleM.D.   On: 09/28/2018 08:41   Dg Chest Portable 1 View  Result Date: 09/22/2018 CLINICAL DATA:  Shortness of breath. EXAM: PORTABLE CHEST 1 VIEW COMPARISON:  09/09/2018 FINDINGS: Cardiomegaly and LEFT-sided pacemaker again noted. Pulmonary vascular congestion is present. There is no evidence of focal airspace disease, pulmonary edema, suspicious pulmonary nodule/mass, pleural effusion, or pneumothorax. No acute bony abnormalities are identified. IMPRESSION: Cardiomegaly with pulmonary vascular congestion. Electronically Signed   By: JMargarette CanadaM.D.   On: 09/22/2018 12:45   Vas UKoreaABurnard BuntingWith/wo Tbi  Result Date: 10/04/2018 LOWER EXTREMITY DOPPLER STUDY Indications: Peripheral vascular disease. High Risk Factors: Hypertension, hyperlipidemia, Diabetes, past history of                    smoking, prior MI.  Vascular Interventions: 04/29/2016: [place] atherectomy of right SFA and                         popliteal  arteries; Drug coated balloon angioplasty of  Right sfa and popliteal arteries with 48m impact. Performing Technologist: PBurley SaverRVT  Examination Guidelines: A complete evaluation includes at minimum, Doppler waveform signals and systolic blood pressure reading at the level of bilateral brachial, anterior tibial, and posterior tibial arteries, when vessel segments are accessible. Bilateral testing is considered an integral part of a complete examination. Photoelectric Plethysmograph (PPG) waveforms and toe systolic pressure readings are included as required and additional duplex testing as needed. Limited examinations for reoccurring indications may be performed as noted.  ABI Findings: +---------+------------------+-----+-------------------+--------+  Right     Rt Pressure (mmHg) Index Waveform            Comment   +---------+------------------+-----+-------------------+--------+  Brachial  139                                                    +---------+------------------+-----+-------------------+--------+  PTA       57                 0.41  dampened monophasic           +---------+------------------+-----+-------------------+--------+  DP        19                 0.14  dampened monophasic           +---------+------------------+-----+-------------------+--------+  Great Toe 0                  0.00  Abnormal            Absent    +---------+------------------+-----+-------------------+--------+ +---------+----------------+-----+----------+---------------------------------+  Left      Lt Pressure      Index Waveform   Comment                                       (mmHg)                                                               +---------+----------------+-----+----------+---------------------------------+  Brachial  138                                                                  +---------+----------------+-----+----------+---------------------------------+  PTA       92                0.66  monophasic                                    +---------+----------------+-----+----------+---------------------------------+  DP        62               0.45  monophasic                                    +---------+----------------+-----+----------+---------------------------------+  Great Toe 0                0.00  Abnormal   unable to obtain due to low                                                     amplitude                          +---------+----------------+-----+----------+---------------------------------+ +-------+-----------+-----------+------------+------------+  ABI/TBI Today's ABI Today's TBI Previous ABI Previous TBI  +-------+-----------+-----------+------------+------------+  Right   0.41        0           0.87         0.00          +-------+-----------+-----------+------------+------------+  Left    0.66        0           0.54         0.00          +-------+-----------+-----------+------------+------------+ Right ABIs appear decreased compared to prior study on 04/20/2017. Left ABIs appear essentially unchanged.  Summary: Right: Resting right ankle-brachial index indicates severe right lower extremity arterial disease. The right toe-brachial index is abnormal. RT great toe pressure = 0 mmHg. Left: Resting left ankle-brachial index indicates moderate left lower extremity arterial disease. The left toe-brachial index is abnormal. LT Great toe pressure = 0 mmHg. PPG tracings appear dampened.  *See table(s) above for measurements and observations.  Electronically signed by [name] MD on 10/04/2018 at 1:52:38 PM.    Final       Carmell Austria, MD 10/17/2018, 9:52 AM  Cc: Mackie Pai, PA-C

## 2018-10-17 NOTE — Patient Instructions (Addendum)
If you are age 70 or older, your body mass index should be between 23-30. Your Body mass index is 42.68 kg/m. If this is out of the aforementioned range listed, please consider follow up with your Primary Care Provider.  If you are age 81 or younger, your body mass index should be between 19-25. Your Body mass index is 42.68 kg/m. If this is out of the aformentioned range listed, please consider follow up with your Primary Care Provider.   Please go to the lab at [facility] ([place] You will need to go to level "B", you do not need an appointment for this. Hours available are 7:30 am - 4:30 pm.   It has been recommended to you by your physician that you have a(n) EGD completed. We did not schedule the procedure(s) today. You will be contacted with a date and time for this procedure.   Thank you,  Dr. Jackquline Denmark

## 2018-10-17 NOTE — Telephone Encounter (Signed)
Covid-19 screening questions   Do you now or have you had a fever in the last 14 days? No  Do you have any respiratory symptoms of shortness of breath or cough now or in the last 14 days? Has CHF  Do you have any family members or close contacts with diagnosed or suspected Covid-19 in the past 14 days? No   Have you been tested for Covid-19 and found to be positive? Was tested by ED and was negative

## 2018-10-20 ENCOUNTER — Ambulatory Visit (INDEPENDENT_AMBULATORY_CARE_PROVIDER_SITE_OTHER): Payer: HMO | Admitting: Cardiology

## 2018-10-20 ENCOUNTER — Other Ambulatory Visit: Payer: Self-pay

## 2018-10-20 ENCOUNTER — Encounter: Payer: Self-pay | Admitting: Cardiology

## 2018-10-20 ENCOUNTER — Encounter: Payer: Self-pay | Admitting: Medical

## 2018-10-20 VITALS — BP 138/68 | HR 67 | Ht 62.0 in | Wt 232.0 lb

## 2018-10-20 DIAGNOSIS — Z950 Presence of cardiac pacemaker: Secondary | ICD-10-CM | POA: Diagnosis not present

## 2018-10-20 DIAGNOSIS — I4821 Permanent atrial fibrillation: Secondary | ICD-10-CM | POA: Diagnosis not present

## 2018-10-20 DIAGNOSIS — I5042 Chronic combined systolic (congestive) and diastolic (congestive) heart failure: Secondary | ICD-10-CM

## 2018-10-20 NOTE — Progress Notes (Signed)
Cardiology Office Note:    Date:  10/20/2018   ID:  Alexis [name], DOB 1950-04-19, MRN [id number]  PCP:  Mackie Pai, PA-C  Cardiologist:  Jenne Campus, MD    Referring MD: Elise Benne   Chief Complaint  Patient presents with  . Follow-up  Congestive heart failure  History of Present Illness:    Alexis [name] is a 68 y.o. female with very complex past medical history.  She came to me with diagnosis of chronic diastolic congestive heart failure however echocardiogram was done which showed both systolic and diastolic congestive heart failure.  Her left ventricular ejection fraction is is in the neighborhood of 40%.  She also have right ventricular enlargement.  Also recently she was diagnosed with cirrhosis of the liver.  She is scheduled to have upper and lower endoscopy.  She also covered diagnosis of paroxysmal atrial fibrillation however interrogation of her pacemaker revealed that this is permanent atrial fibrillation.  She is not anticoagulated because of some history of bleeding.  However we are waiting for GI evaluation to determine if there would be any role for anticoagulation.  If not watchman device should be considered.  Overall she is doing poorly still short of breath tired and fatigued.  Denies having any pain in her chest does have chronic pain in her legs.  Past Medical History:  Diagnosis Date  . Allergy   . Anxiety   . Arthritis   . Asthma   . Atrial fibrillation ([place])   . CHF (congestive heart failure) ([place])   . Cirrhosis ([place])   . COPD (chronic obstructive pulmonary disease) ([place])   . Diabetes mellitus without complication ([place])   . Hyperlipidemia   . Hypertension   . Macrocytic anemia 09/04/2018  . Myocardial infarction ([place])    several  . Pacemaker    CRT-P with RV lead and His Bundle lead ( high threshold)   . Peripheral neuropathy   . Pneumonia   . PONV (postoperative nausea and vomiting)    unsure of exactly what happened at [facility] in 2010 (knee surgery) that led to crital care  . Thyroid disease     Past Surgical History:  Procedure Laterality Date  . ABDOMINAL AORTOGRAM W/LOWER EXTREMITY N/A 04/29/2016   Procedure: Abdominal Aortogram w/Lower Extremity;  Surgeon: Waynetta Sandy, MD;  Location: [place] [facility];  Service: Cardiovascular;  Laterality: N/A;  . ABDOMINAL AORTOGRAM W/LOWER EXTREMITY N/A 05/06/2017   Procedure: ABDOMINAL AORTOGRAM W/LOWER EXTREMITY;  Surgeon: Angelia Mould, MD;  Location: [place] [facility];  Service: Cardiovascular;  Laterality: N/A;  bilateral  . ABDOMINAL HYSTERECTOMY    . AMPUTATION Right 07/30/2016   Procedure: AMPUTATION RIGHT SECOND TOE;  Surgeon: Angelia Mould, MD;  Location: [place];  Service: Vascular;  Laterality: Right;  . CARDIAC CATHETERIZATION     2005 at [facility]  . PERIPHERAL VASCULAR ATHERECTOMY Right 04/29/2016   Procedure: Peripheral Vascular Atherectomy-Right Popliteal;  Surgeon: Waynetta Sandy, MD;  Location: [place] [facility];  Service: Cardiovascular;  Laterality: Right;  . PERIPHERAL VASCULAR INTERVENTION Right 04/29/2016   Procedure: Peripheral Vascular Intervention-Right Popliteal;  Surgeon: Waynetta Sandy, MD;  Location: [place] [facility];  Service: Cardiovascular;  Laterality: Right;  POPLITEAL PTA  . RADIOFREQUENCY ABLATION    . TOTAL KNEE ARTHROPLASTY    . TUBAL LIGATION      Current Medications: Current Meds  Medication Sig  . albuterol (PROVENTIL HFA;VENTOLIN HFA) 108 (  90 Base) MCG/ACT inhaler Inhale 2 puffs into the lungs every 6 (six) hours as needed for wheezing or shortness of breath.  . allopurinol (ZYLOPRIM) 100 MG tablet TAKE ONE TABLET BY MOUTH TWICE DAILY  . AMBULATORY NON FORMULARY MEDICATION Motorized scooter.  Diagnosis: Osteoarthritis M19.90  . Continuous Blood Gluc Sensor (FREESTYLE LIBRE 14 DAY SENSOR) MISC   . DULoxetine (CYMBALTA) 30 MG capsule TAKE ONE CAPSULE BY MOUTH  EVERY NIGHT AT BEDTIME  . Ferrous Sulfate (IRON PO) Take by mouth. Patient unsure of dose  . fluticasone (FLONASE) 50 MCG/ACT nasal spray Place 2 sprays into both nostrils at bedtime.  . fluticasone (FLOVENT HFA) 110 MCG/ACT inhaler Inhale 2 puffs into the lungs 2 (two) times daily as needed (bronchitis).  . furosemide (LASIX) 20 MG tablet Take 1 tablet (20 mg total) by mouth 2 (two) times daily.  . Levothyroxine Sodium 150 MCG CAPS Take 1 capsule (150 mcg total) by mouth daily before breakfast.  . metoprolol succinate (TOPROL-XL) 25 MG 24 hr tablet TAKE ONE (1) TABLET BY MOUTH EACH DAY  . NOVOLOG 100 UNIT/ML injection 100 units per day in VGO unknown bolus  . Prenatal Vit-Fe Fumarate-FA (MULTIVITAMIN-PRENATAL) 27-0.8 MG TABS tablet Take 1 tablet by mouth daily. Takes for nail and hair growth   . psyllium (METAMUCIL SMOOTH TEXTURE) 58.6 % powder Take 1 packet by mouth daily.  . RABEprazole (ACIPHEX) 20 MG tablet Take 1 tablet (20 mg total) by mouth daily.     Allergies:   Indomethacin, Pregabalin, Sulfa antibiotics, and Morphine and related   Social History   Socioeconomic History  . Marital status: Married    Spouse name: Not on file  . Number of children: Not on file  . Years of education: Not on file  . Highest education level: Not on file  Occupational History  . Occupation: retired  Scientific laboratory technician  . Financial resource strain: Patient refused  . Food insecurity    Worry: Not on file    Inability: Not on file  . Transportation needs    Medical: Not on file    Non-medical: Not on file  Tobacco Use  . Smoking status: Former Smoker    Quit date: 03/01/1998    Years since quitting: 20.6  . Smokeless tobacco: Never Used  Substance and Sexual Activity  . 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  . Drug use: Yes    Types: Marijuana    Comment: remote use  . Sexual activity: Never  Lifestyle  .  Physical activity    Days per week: Not on file    Minutes per session: Not on file  . Stress: Not on file  Relationships  . Social Herbalist on phone: Not on file    Gets together: Not on file    Attends religious service: Not on file    Active member of club or organization: Not on file    Attends meetings of clubs or organizations: Not on file    Relationship status: Not on file  Other Topics Concern  . Not on file  Social History Narrative  . Not on file     Family History: The patient's family history includes Diabetes in her brother, father, and mother; Heart attack in her brother and father; Heart disease in her father; Hyperlipidemia in her brother, father, and mother. ROS:   Please see the history  of present illness.    All 14 point review of systems negative except as described per history of present illness  EKGs/Labs/Other Studies Reviewed:      Recent Labs: 09/09/2018: B Natriuretic Peptide 133.5 09/26/2018: NT-Pro BNP 417 10/10/2018: ALT 22; BUN 21; Creatinine 0.76; Hemoglobin 10.5; Platelet Count 66; Potassium 4.0; Sodium 138  Recent Lipid Panel    Component Value Date/Time   CHOL 93 (L) 05/10/2018 0944   TRIG 147 05/10/2018 0944   HDL 31 (L) 05/10/2018 0944   CHOLHDL 3.0 05/10/2018 0944   CHOLHDL 6 04/10/2015 1040   VLDL 50.8 (H) 04/10/2015 1040   LDLCALC 33 05/10/2018 0944   LDLDIRECT 111.0 04/10/2015 1040    Physical Exam:    VS:  BP 138/68 (BP Location: Left Arm, Patient Position: Sitting)   Pulse 67   Ht 5' 2"  (1.575 m)   Wt 232 lb (105.2 kg)   SpO2 94%   BMI 42.43 kg/m     Wt Readings from Last 3 Encounters:  10/20/18 232 lb (105.2 kg)  10/17/18 233 lb 6 oz (105.9 kg)  10/04/18 235 lb (106.6 kg)     GEN:  Well nourished, well developed in no acute distress HEENT: Normal NECK: No JVD; No carotid bruits LYMPHATICS: No lymphadenopathy CARDIAC: RRR, no murmurs, no rubs, no gallops RESPIRATORY:  Clear to auscultation without  rales, wheezing or rhonchi  ABDOMEN: Soft, non-tender, non-distended MUSCULOSKELETAL:  No edema; No deformity  SKIN: Warm and dry LOWER EXTREMITIES: no swelling NEUROLOGIC:  Alert and oriented x 3 PSYCHIATRIC:  Normal affect   ASSESSMENT:    1. Permanent atrial fibrillation   2. Chronic combined systolic and diastolic congestive heart failure ([place])   3. Pacemaker    PLAN:    In order of problems listed above:  1. Permanent atrial fibrillation.  Rate appears to be controlled discussion about anticoagulation as above.  Hopefully will be able to anticoagulate if not we will go with watchman device. 2. Chronic combined systolic and diastolic congestive heart failure.  I will ask you to have Chem-7 done today if Chem-7 is appropriate then will increase dose of diuretic she takes 20 mg twice daily with definitely she will required more may be Aldactone will need to be added.  Will wait for results of Chem-7 before proceeding with a increasing dose of diuretic. 3. Pacemaker present with dysfunctional His bundle lead.  However she is permanently in atrial fibrillation.  We will continue monitoring.   Medication Adjustments/Labs and Tests Ordered: Current medicines are reviewed at length with the patient today.  Concerns regarding medicines are outlined above.  No orders of the defined types were placed in this encounter.  Medication changes: No orders of the defined types were placed in this encounter.   Signed, Park Liter, MD, [facility] 10/20/2018 3:48 PM    [place]

## 2018-10-20 NOTE — Addendum Note (Signed)
Addended by: Ashok Norris on: 10/20/2018 03:56 PM   Modules accepted: Orders

## 2018-10-20 NOTE — Patient Instructions (Addendum)
Medication Instructions:  Your physician recommends that you continue on your current medications as directed. Please refer to the Current Medication list given to you today.  If you need a refill on your cardiac medications before your next appointment, please call your pharmacy.   Lab work: Your physician recommends that you return for lab work today: bmp, pro bnp   If you have labs (blood work) drawn today and your tests are completely normal, you will receive your results only by: Marland Kitchen MyChart Message (if you have MyChart) OR . A paper copy in the mail If you have any lab test that is abnormal or we need to change your treatment, we will call you to review the results.  Testing/Procedures: None.   Follow-Up: At [facility], you and your health needs are our priority.  As part of our continuing mission to provide you with exceptional heart care, we have created designated Provider Care Teams.  These Care Teams include your primary Cardiologist (physician) and Advanced Practice Providers (APPs -  Physician Assistants and Nurse Practitioners) who all work together to provide you with the care you need, when you need it. You will need a follow up appointment in 1 months.  Please call our office 2 months in advance to schedule this appointment.  You may see Kirk Ruths, MD or another member of our [place] Provider Team in [place]: Shirlee More, MD . Jyl Heinz, MD  Any Other Special Instructions Will Be Listed Below (If Applicable).

## 2018-10-25 NOTE — Telephone Encounter (Signed)
Left message for patient to call back to the office to schedule EGD/colonoscopy per MD recommendations; also sent a MyChart message;

## 2018-10-25 NOTE — Telephone Encounter (Signed)
Bre, Lets get both EGD and colonoscopy done.  MiraLAX preparation See [place] as message. Also please see last note.  Thx  RG

## 2018-10-26 ENCOUNTER — Telehealth: Payer: Self-pay | Admitting: Gastroenterology

## 2018-10-26 NOTE — Telephone Encounter (Signed)
Called and spoke with patient-patient requests NOT to have the EGD and colon done because of her history with her husband and she is concerned about the anesthesia and having problems with her heart during the procedure; patient reports she will try to get the lab work completed as requested; patient advised to call back to the office should she change her mind about the procedure or have questions/concners; patient was advised her procedure would be at the hospital with all available staff and equipment needed to take exceptional care of her, however, due to her heart conditions, she still does not want to have the procedure done;   Please advise if there are alternative measures the patient can complete for her care;

## 2018-10-27 DIAGNOSIS — I4821 Permanent atrial fibrillation: Secondary | ICD-10-CM | POA: Diagnosis not present

## 2018-10-27 DIAGNOSIS — I5042 Chronic combined systolic (congestive) and diastolic (congestive) heart failure: Secondary | ICD-10-CM | POA: Diagnosis not present

## 2018-10-27 NOTE — Telephone Encounter (Signed)
Thanks for letting me know RG

## 2018-10-28 LAB — BASIC METABOLIC PANEL
BUN/Creatinine Ratio: 24 (ref 12–28)
BUN: 22 mg/dL (ref 8–27)
CO2: 27 mmol/L (ref 20–29)
Calcium: 9.6 mg/dL (ref 8.7–10.3)
Chloride: 94 mmol/L — ABNORMAL LOW (ref 96–106)
Creatinine, Ser: 0.93 mg/dL (ref 0.57–1.00)
GFR calc Af Amer: 73 mL/min/{1.73_m2} (ref >59)
GFR calc non Af Amer: 63 mL/min/{1.73_m2} (ref >59)
Glucose: 220 mg/dL — ABNORMAL HIGH (ref 65–99)
Potassium: 3.9 mmol/L (ref 3.5–5.2)
Sodium: 136 mmol/L (ref 134–144)

## 2018-10-28 LAB — PRO B NATRIURETIC PEPTIDE: NT-Pro BNP: 621 pg/mL — ABNORMAL HIGH (ref 0–301)

## 2018-11-03 ENCOUNTER — Other Ambulatory Visit: Payer: Self-pay

## 2018-11-03 DIAGNOSIS — I5032 Chronic diastolic (congestive) heart failure: Secondary | ICD-10-CM

## 2018-11-03 DIAGNOSIS — I5042 Chronic combined systolic (congestive) and diastolic (congestive) heart failure: Secondary | ICD-10-CM

## 2018-11-03 DIAGNOSIS — I10 Essential (primary) hypertension: Secondary | ICD-10-CM

## 2018-11-03 MED ORDER — SPIRONOLACTONE 25 MG PO TABS: 25.0000 mg | ORAL_TABLET | Freq: Every day | ORAL | 3 refills | Status: DC

## 2018-11-08 ENCOUNTER — Ambulatory Visit (HOSPITAL_BASED_OUTPATIENT_CLINIC_OR_DEPARTMENT_OTHER)
Admission: RE | Admit: 2018-11-08 | Discharge: 2018-11-08 | Disposition: A | Discharge status: Home / Self Care | Payer: HMO | Source: Ambulatory Visit | Attending: Medical | Admitting: Medical

## 2018-11-08 ENCOUNTER — Encounter: Payer: Self-pay | Admitting: Medical

## 2018-11-08 ENCOUNTER — Other Ambulatory Visit: Payer: Self-pay

## 2018-11-08 ENCOUNTER — Ambulatory Visit (INDEPENDENT_AMBULATORY_CARE_PROVIDER_SITE_OTHER): Payer: HMO | Admitting: Medical

## 2018-11-08 ENCOUNTER — Telehealth: Payer: Self-pay | Admitting: Cardiology

## 2018-11-08 VITALS — BP 138/88 | HR 78 | Temp 97.1°F | Resp 18 | Ht 62.0 in | Wt 236.6 lb

## 2018-11-08 DIAGNOSIS — K7460 Unspecified cirrhosis of liver: Secondary | ICD-10-CM

## 2018-11-08 DIAGNOSIS — R0600 Dyspnea, unspecified: Secondary | ICD-10-CM

## 2018-11-08 DIAGNOSIS — I509 Heart failure, unspecified: Secondary | ICD-10-CM | POA: Insufficient documentation

## 2018-11-08 DIAGNOSIS — I10 Essential (primary) hypertension: Secondary | ICD-10-CM | POA: Diagnosis not present

## 2018-11-08 DIAGNOSIS — Z23 Encounter for immunization: Secondary | ICD-10-CM

## 2018-11-08 DIAGNOSIS — D649 Anemia, unspecified: Secondary | ICD-10-CM

## 2018-11-08 DIAGNOSIS — R0602 Shortness of breath: Secondary | ICD-10-CM | POA: Diagnosis not present

## 2018-11-08 DIAGNOSIS — G629 Polyneuropathy, unspecified: Secondary | ICD-10-CM | POA: Diagnosis not present

## 2018-11-08 MED ORDER — DULOXETINE HCL 60 MG PO CPEP: 60.0000 mg | ORAL_CAPSULE | Freq: Every day | ORAL | 3 refills | Status: DC

## 2018-11-08 NOTE — Progress Notes (Signed)
Subjective:    Patient ID: Alexis [name], female    DOB: [birth year], 68 y.o.   MRN: [id number]  HPI  Pt in for follow up.  She has multiple cor morbidities.  Htn, chf, pvd, atrial fibrillation, asthma, copd, low thyroid, diabetes, cirrhosis of liver, anemia, gallstones, obesity and questionable melena.  Pt was to be scheduled for egd but on review of chart and discussion today she is reluctant to do so due to fear of complications. Pt has some anemia hx. She is on iron  Last cardiologist visit  summary note. 1. Permanent atrial fibrillation.  Rate appears to be controlled discussion about anticoagulation as above.  Hopefully will be able to anticoagulate if not we will go with watchman device. 2. Chronic combined systolic and diastolic congestive heart failure.  I will ask you to have Chem-7 done today if Chem-7 is appropriate then will increase dose of diuretic she takes 20 mg twice daily with definitely she will required more may be Aldactone will need to be added.  Will wait for results of Chem-7 before proceeding with a increasing dose of diuretic. 3. Pacemaker present with dysfunctional His bundle lead.  However she is permanently in atrial fibrillation.  We will continue monitoring.   Pt eliquis stopped by specialist/cardiologist. Pt has follow up on Dr. Kirkland Hun on Monday.  Pt has some chronic pain in rt lower ext. She states pain related to pvd. No color changes to rt foot. No ulcerations.  Pt has future labs to be done today upstairs. She has not collected her hemocult cards yet.    Review of Systems  Constitutional: Negative for chills, fatigue and fever.  HENT: Negative for congestion.   Respiratory: Positive for shortness of breath. Negative for chest tightness and wheezing.        Shortness of breath when lies supine when sleeping.  Cardiovascular: Negative for chest pain and palpitations.  Gastrointestinal: Negative for abdominal pain, blood in stool,  constipation, diarrhea, nausea and vomiting.  Musculoskeletal: Negative for back pain, neck pain and neck stiffness.  Skin: Negative for rash.  Neurological: Negative for dizziness, weakness, light-headedness and headaches.  Hematological: Negative for adenopathy. Does not bruise/bleed easily.  Psychiatric/Behavioral: Negative for behavioral problems and confusion.    Past Medical History:  Diagnosis Date  . Allergy   . Anxiety   . Arthritis   . Asthma   . Atrial fibrillation ([place])   . CHF (congestive heart failure) ([place])   . Cirrhosis ([place])   . COPD (chronic obstructive pulmonary disease) ([place])   . Diabetes mellitus without complication ([place])   . Hyperlipidemia   . Hypertension   . Macrocytic anemia 09/04/2018  . Myocardial infarction ([place])    several  . Pacemaker    CRT-P with RV lead and His Bundle lead ( high threshold)   . Peripheral neuropathy   . Pneumonia   . PONV (postoperative nausea and vomiting)    unsure of exactly what happened at [facility] in 2010 (knee surgery) that led to crital care  . Thyroid disease      Social History   Socioeconomic History  . Marital status: Married    Spouse name: Not on file  . Number of children: Not on file  . Years of education: Not on file  . Highest education level: Not on file  Occupational History  . Occupation: retired  Scientific laboratory technician  . Financial resource strain: Patient refused  . Food insecurity    Worry:  Not on file    Inability: Not on file  . Transportation needs    Medical: Not on file    Non-medical: Not on file  Tobacco Use  . Smoking status: Former Smoker    Quit date: 03/01/1998    Years since quitting: 20.7  . Smokeless tobacco: Never Used  Substance and Sexual Activity  . 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  . Drug use: Yes    Types: Marijuana    Comment: remote use  . Sexual activity: Never  Lifestyle   . Physical activity    Days per week: Not on file    Minutes per session: Not on file  . Stress: Not on file  Relationships  . Social Herbalist on phone: Not on file    Gets together: Not on file    Attends religious service: Not on file    Active member of club or organization: Not on file    Attends meetings of clubs or organizations: Not on file    Relationship status: Not on file  . Intimate partner violence    Fear of current or ex partner: Not on file    Emotionally abused: Not on file    Physically abused: Not on file    Forced sexual activity: Not on file  Other Topics Concern  . Not on file  Social History Narrative  . Not on file    Past Surgical History:  Procedure Laterality Date  . ABDOMINAL AORTOGRAM W/LOWER EXTREMITY N/A 04/29/2016   Procedure: Abdominal Aortogram w/Lower Extremity;  Surgeon: Waynetta Sandy, MD;  Location: [place] [facility];  Service: Cardiovascular;  Laterality: N/A;  . ABDOMINAL AORTOGRAM W/LOWER EXTREMITY N/A 05/06/2017   Procedure: ABDOMINAL AORTOGRAM W/LOWER EXTREMITY;  Surgeon: Angelia Mould, MD;  Location: [place] [facility];  Service: Cardiovascular;  Laterality: N/A;  bilateral  . ABDOMINAL HYSTERECTOMY    . AMPUTATION Right 07/30/2016   Procedure: AMPUTATION RIGHT SECOND TOE;  Surgeon: Angelia Mould, MD;  Location: [place];  Service: Vascular;  Laterality: Right;  . CARDIAC CATHETERIZATION     2005 at [facility]  . PERIPHERAL VASCULAR ATHERECTOMY Right 04/29/2016   Procedure: Peripheral Vascular Atherectomy-Right Popliteal;  Surgeon: Waynetta Sandy, MD;  Location: [place] [facility];  Service: Cardiovascular;  Laterality: Right;  . PERIPHERAL VASCULAR INTERVENTION Right 04/29/2016   Procedure: Peripheral Vascular Intervention-Right Popliteal;  Surgeon: Waynetta Sandy, MD;  Location: [place] [facility];  Service: Cardiovascular;  Laterality: Right;  POPLITEAL PTA  . RADIOFREQUENCY  ABLATION    . TOTAL KNEE ARTHROPLASTY    . TUBAL LIGATION      Family History  Problem Relation Age of Onset  . Diabetes Mother   . Hyperlipidemia Mother   . Diabetes Father   . Hyperlipidemia Father   . Heart disease Father        before age 56  . Heart attack Father   . Diabetes Brother   . Hyperlipidemia Brother   . Heart attack Brother     Allergies  Allergen Reactions  . Indomethacin Other (See Comments)     Renal Insufficiency  . Pregabalin Other (See Comments)    DIZZINESS   . Sulfa Antibiotics Hives  . Morphine And Related Nausea And Vomiting    Current Outpatient Medications on File Prior to Visit  Medication Sig Dispense  Refill  . albuterol (PROVENTIL HFA;VENTOLIN HFA) 108 (90 Base) MCG/ACT inhaler Inhale 2 puffs into the lungs every 6 (six) hours as needed for wheezing or shortness of breath. 1 Inhaler 2  . allopurinol (ZYLOPRIM) 100 MG tablet TAKE ONE TABLET BY MOUTH TWICE DAILY 60 tablet 3  . AMBULATORY NON FORMULARY MEDICATION Motorized scooter.  Diagnosis: Osteoarthritis M19.90 1 each 0  . B-D ULTRAFINE III SHORT PEN 31G X 8 MM MISC     . Continuous Blood Gluc Sensor (FREESTYLE LIBRE 14 DAY SENSOR) MISC     . DULoxetine (CYMBALTA) 30 MG capsule TAKE ONE CAPSULE BY MOUTH EVERY NIGHT AT BEDTIME 90 capsule 1  . Ferrous Sulfate (IRON PO) Take by mouth. Patient unsure of dose    . fluticasone (FLONASE) 50 MCG/ACT nasal spray Place 2 sprays into both nostrils at bedtime.    . fluticasone (FLOVENT HFA) 110 MCG/ACT inhaler Inhale 2 puffs into the lungs 2 (two) times daily as needed (bronchitis).    . furosemide (LASIX) 20 MG tablet Take 1 tablet (20 mg total) by mouth 2 (two) times daily. 180 tablet 3  . Levothyroxine Sodium 150 MCG CAPS Take 1 capsule (150 mcg total) by mouth daily before breakfast. 30 capsule 3  . metoprolol succinate (TOPROL-XL) 25 MG 24 hr tablet TAKE ONE (1) TABLET BY MOUTH EACH DAY 90 tablet 1  . NOVOLOG 100 UNIT/ML injection 100 units per  day in VGO unknown bolus    . Prenatal Vit-Fe Fumarate-FA (MULTIVITAMIN-PRENATAL) 27-0.8 MG TABS tablet Take 1 tablet by mouth daily. Takes for nail and hair growth     . psyllium (METAMUCIL SMOOTH TEXTURE) 58.6 % powder Take 1 packet by mouth daily. 283 g 12  . RABEprazole (ACIPHEX) 20 MG tablet Take 1 tablet (20 mg total) by mouth daily. 30 tablet 11  . spironolactone (ALDACTONE) 25 MG tablet Take 1 tablet (25 mg total) by mouth daily. 30 tablet 3   No current facility-administered medications on file prior to visit.     BP 138/88 (BP Location: Left Arm, Patient Position: Sitting, Cuff Size: Normal)   Pulse 78   Temp (!) 97.1 F (36.2 C) (Temporal)   Resp 18   Ht 5' 2"  (1.575 m)   Wt 236 lb 9.6 oz (107.3 kg)   SpO2 94%   BMI 43.27 kg/m       Objective:   Physical Exam  General Mental Status- Alert. General Appearance- Not in acute distress.   Skin General: Color- Normal Color. Moisture- Normal Moisture.  Neck Carotid Arteries- Normal color. Moisture- Normal Moisture. No carotid bruits. No JVD.  Chest and Lung Exam Auscultation: Breath Sounds:-Normal.  Cardiovascular Auscultation:Rythm- Regular. Murmurs & Other Heart Sounds:Auscultation of the heart reveals- No Murmurs.  Abdomen Inspection:-Inspeection Normal. Palpation/Percussion:Note:No mass. Palpation and Percussion of the abdomen reveal- Non Tender, Non Distended + BS, no rebound or guarding.   Neurologic Cranial Nerve exam:- CN III-XII intact(No nystagmus), symmetric smile. Strength:- 5/5 equal and symmetric strength both upper and lower extremities.  Rt lower ext- no darkness to skin, no ulcers. Delayed capillary refill. Decreased pulses.        Assessment & Plan:  For your history of atrial fibrillation, CHF with some dyspnea at night recently, I do want you to get a chest x-ray today.  Also go ahead and get labs ordered by Dr. Lyndel Safe.  These include metabolic panel and BNP.  With the results of these  labs we can determine if you might need to make  dose adjustment on your diuretic or not.   Also if your chest x-ray and labs are normal would recommend trying albuterol inhaler to see if there is helps dyspnea at night.  Attend cardiologist follow-up next week as you are no longer on anticoagulant but cardiologist per last note is considering recommending watchman device.  For history of intermittent abdomen pain, continue with proton pump inhibitor.  Also you have known study liver with some cirrhosis so advised to eat healthy.  Known gallstones as well so if your pain changes/worsen significantly let us know in that event would order ultrasound.  For diabetes continue with current diabetic medications as prescribed by endocrinologist.  We will follow your CBC results when those are back I would recommend that you continue iron and eventually turn in the stool cards for blood as a positive test might get the bouncing you I decided to get EGD.  Continue to follow-up with Dr. Patsy Baltimore as well.  For decreased mood and right lower leg pain which might have component of neuropathy, increased your Cymbalta to 60 mg daily.  Follow-up in 3 weeks or as needed.  25+ minutes spent with pt today. 50% of time spent counseling pt on plan going forward.  Mackie Pai, PA-C

## 2018-11-08 NOTE — Telephone Encounter (Signed)
Please call patient regarding meds

## 2018-11-08 NOTE — Patient Instructions (Addendum)
For your history of atrial fibrillation, CHF with some dyspnea at night recently, I do want you to get a chest x-ray today.  Also go ahead and get labs ordered by Dr. Lyndel Safe.  These include metabolic panel and BNP.  With the results of these labs we can determine if you might need to make dose adjustment on your diuretic or not.   Also if your chest x-ray and labs are normal would recommend trying albuterol inhaler to see if there is helps dyspnea at night.  Attend cardiologist follow-up next week as you are no longer on anticoagulant but cardiologist per last note is considering recommending watchman device.  For history of intermittent abdomen pain, continue with proton pump inhibitor.  Also you have known study liver with some cirrhosis so advised to eat healthy.  Known gallstones as well so if your pain changes/worsen significantly let us know in that event would order ultrasound.  For diabetes continue with current diabetic medications as prescribed by endocrinologist.  We will follow your CBC results when those are back I would recommend that you continue iron and eventually turn in the stool cards for blood as a positive test might get the bouncing you I decided to get EGD.  Continue to follow-up with Dr. Patsy Baltimore as well.  For decreased mood and right lower leg pain which might have component of neuropathy, increased your Cymbalta to 60 mg daily.  Follow-up in 3 weeks or as needed.  Flu vaccine today.

## 2018-11-09 LAB — BASIC METABOLIC PANEL
BUN/Creatinine Ratio: 19 (ref 12–28)
BUN: 19 mg/dL (ref 8–27)
CO2: 28 mmol/L (ref 20–29)
Calcium: 9.6 mg/dL (ref 8.7–10.3)
Chloride: 96 mmol/L (ref 96–106)
Creatinine, Ser: 0.98 mg/dL (ref 0.57–1.00)
GFR calc Af Amer: 68 mL/min/{1.73_m2} (ref >59)
GFR calc non Af Amer: 59 mL/min/{1.73_m2} — ABNORMAL LOW (ref >59)
Glucose: 223 mg/dL — ABNORMAL HIGH (ref 65–99)
Potassium: 3.8 mmol/L (ref 3.5–5.2)
Sodium: 139 mmol/L (ref 134–144)

## 2018-11-09 NOTE — Telephone Encounter (Signed)
Left message for patient to return call.

## 2018-11-10 ENCOUNTER — Telehealth: Payer: Self-pay | Admitting: Cardiology

## 2018-11-10 MED ORDER — FUROSEMIDE 20 MG PO TABS: 20.0000 mg | ORAL_TABLET | Freq: Two times a day (BID) | ORAL | 1 refills | Status: DC

## 2018-11-10 NOTE — Telephone Encounter (Signed)
Lasix 20 mg twice daily refilled.

## 2018-11-10 NOTE — Telephone Encounter (Signed)
°  Per doctor this should be twice a day, not once   1. Which medications need to be refilled? (please list name of each medication and dose if known) lasix  2. Which pharmacy/location (including street and city if local pharmacy) is medication to be sent to?[name]  3. Do they need a 30 day or 90 day supply? [place]

## 2018-11-10 NOTE — Telephone Encounter (Signed)
Pt called reporting she should be on lasix 48m BID, but script was sent for 276mBID today. I did not see a note stating that this should have been increased. Will message primary MD to follow up and address if this is the correct dose. Have asked that she pick up this Rx and take over the weekend for now.

## 2018-11-13 ENCOUNTER — Encounter: Payer: HMO | Admitting: Cardiology

## 2018-11-13 ENCOUNTER — Ambulatory Visit: Payer: Self-pay | Admitting: Pharmacist

## 2018-11-13 ENCOUNTER — Other Ambulatory Visit: Payer: Self-pay | Admitting: Pharmacist

## 2018-11-13 MED ORDER — FUROSEMIDE 20 MG PO TABS: ORAL_TABLET | ORAL | 1 refills | Status: DC

## 2018-11-13 NOTE — Patient Outreach (Signed)
[facility]) Care Management  11/13/2018  SUNI JARNAGIN 1951/02/22 [id number]   MARLA POULIOT [birth year] [id number]   Patient was called regarding medication review and assistance as she is part of [facility] CSNP. Unfortunately, she did not answer the phone. HIPAA compliant message was left on her voicemail.  Today's call was the 5thunsuccessful call.   Plan: Send another unsuccessful contact letter. Follow up with the patient 30 days.  Elayne Guerin, PharmD, [place] Clinical Pharmacist [phone]

## 2018-11-13 NOTE — Telephone Encounter (Signed)
Reviewed chart and looked at previous labs. Patient was increased to 40 mg in the morning and 20 mg at noon. New prescription has been sent in.

## 2018-11-13 NOTE — Addendum Note (Signed)
Addended by: Aleatha Borer on: 11/13/2018 03:04 PM   Modules accepted: Orders

## 2018-11-15 ENCOUNTER — Telehealth: Payer: Self-pay | Admitting: Emergency Medicine

## 2018-11-15 DIAGNOSIS — I5032 Chronic diastolic (congestive) heart failure: Secondary | ICD-10-CM

## 2018-11-15 DIAGNOSIS — I10 Essential (primary) hypertension: Secondary | ICD-10-CM

## 2018-11-15 NOTE — Telephone Encounter (Signed)
Patient informed of lab results and advised she is to take lasix 40 mg in the morning and 20 in the evening and have labs rechecked in 1 week. She verbally understands. No further questions.

## 2018-11-17 ENCOUNTER — Encounter: Payer: Self-pay | Admitting: Cardiology

## 2018-11-17 ENCOUNTER — Ambulatory Visit (INDEPENDENT_AMBULATORY_CARE_PROVIDER_SITE_OTHER): Payer: HMO | Admitting: Cardiology

## 2018-11-17 VITALS — BP 110/70 | HR 76 | Ht 62.0 in | Wt 240.0 lb

## 2018-11-17 DIAGNOSIS — E785 Hyperlipidemia, unspecified: Secondary | ICD-10-CM

## 2018-11-17 DIAGNOSIS — I4821 Permanent atrial fibrillation: Secondary | ICD-10-CM

## 2018-11-17 DIAGNOSIS — I10 Essential (primary) hypertension: Secondary | ICD-10-CM | POA: Diagnosis not present

## 2018-11-17 NOTE — Progress Notes (Signed)
Cardiology Office Note:    Date:  11/17/2018   ID:  Alexis [name], DOB Apr 05, 1950, MRN [id number]  PCP:  Mackie Pai, PA-C  Cardiologist:  Jenne Campus, MD    Referring MD: Elise Benne   Chief Complaint  Patient presents with  . 1 month follow up  Doing for  History of Present Illness:    Alexis [name] is a 68 y.o. female very complex past medical history.  She does have some cirrhosis of the liver to being investigated.  Also peripheral vascular disease, hypertension.  Recently she had echocardiogram done which showed ejection fraction of 40% she was scheduled to have upper GI done for better evaluation of her varicosities however she canceled this because she got scared with the fact that her heart is 40%.  I explained to her that normal numbers 8588 her is 40% so it is diminished however mildly tacky right now will be to continue with beta-blocker I will check a Chem-7 today to see if I have a chance to add ARB to her medical regimen hopefully improve her heart.  Denies having a chest pain tightness squeezing pressure been chest no palpitations does have some swelling of lower extremities cannot walk much because she does have peripheral vascular disease involving the lower extremities.  Past Medical History:  Diagnosis Date  . Allergy   . Anxiety   . Arthritis   . Asthma   . Atrial fibrillation ([place])   . CHF (congestive heart failure) ([place])   . Cirrhosis ([place])   . COPD (chronic obstructive pulmonary disease) ([place])   . Diabetes mellitus without complication ([place])   . Hyperlipidemia   . Hypertension   . Macrocytic anemia 09/04/2018  . Myocardial infarction ([place])    several  . Pacemaker    CRT-P with RV lead and His Bundle lead ( high threshold)   . Peripheral neuropathy   . Pneumonia   . PONV (postoperative nausea and vomiting)    unsure of exactly what happened at [facility] in 2010 (knee surgery) that led to crital care  . Thyroid disease      Past Surgical History:  Procedure Laterality Date  . ABDOMINAL AORTOGRAM W/LOWER EXTREMITY N/A 04/29/2016   Procedure: Abdominal Aortogram w/Lower Extremity;  Surgeon: Waynetta Sandy, MD;  Location: [place] [facility];  Service: Cardiovascular;  Laterality: N/A;  . ABDOMINAL AORTOGRAM W/LOWER EXTREMITY N/A 05/06/2017   Procedure: ABDOMINAL AORTOGRAM W/LOWER EXTREMITY;  Surgeon: Angelia Mould, MD;  Location: [place] [facility];  Service: Cardiovascular;  Laterality: N/A;  bilateral  . ABDOMINAL HYSTERECTOMY    . AMPUTATION Right 07/30/2016   Procedure: AMPUTATION RIGHT SECOND TOE;  Surgeon: Angelia Mould, MD;  Location: [place];  Service: Vascular;  Laterality: Right;  . CARDIAC CATHETERIZATION     2005 at [facility]  . PERIPHERAL VASCULAR ATHERECTOMY Right 04/29/2016   Procedure: Peripheral Vascular Atherectomy-Right Popliteal;  Surgeon: Waynetta Sandy, MD;  Location: [place] [facility];  Service: Cardiovascular;  Laterality: Right;  . PERIPHERAL VASCULAR INTERVENTION Right 04/29/2016   Procedure: Peripheral Vascular Intervention-Right Popliteal;  Surgeon: Waynetta Sandy, MD;  Location: [place] [facility];  Service: Cardiovascular;  Laterality: Right;  POPLITEAL PTA  . RADIOFREQUENCY ABLATION    . TOTAL KNEE ARTHROPLASTY    . TUBAL LIGATION      Current Medications: Current Meds  Medication Sig  . albuterol (PROVENTIL HFA;VENTOLIN HFA) 108 (90 Base) MCG/ACT inhaler Inhale 2 puffs  into the lungs every 6 (six) hours as needed for wheezing or shortness of breath.  . allopurinol (ZYLOPRIM) 100 MG tablet TAKE ONE TABLET BY MOUTH TWICE DAILY  . AMBULATORY NON FORMULARY MEDICATION Motorized scooter.  Diagnosis: Osteoarthritis M19.90  . B-D ULTRAFINE III SHORT PEN 31G X 8 MM MISC   . Continuous Blood Gluc Sensor (FREESTYLE LIBRE 14 DAY SENSOR) MISC   . DULoxetine (CYMBALTA) 60 MG capsule Take 1 capsule (60 mg total) by mouth daily.  . Ferrous  Sulfate (IRON PO) Take by mouth. Patient unsure of dose  . fluticasone (FLONASE) 50 MCG/ACT nasal spray Place 2 sprays into both nostrils at bedtime.  . fluticasone (FLOVENT HFA) 110 MCG/ACT inhaler Inhale 2 puffs into the lungs 2 (two) times daily as needed (bronchitis).  . furosemide (LASIX) 20 MG tablet Take 2 tables in the morning and 1 tablet at noon  . Levothyroxine Sodium 150 MCG CAPS Take 1 capsule (150 mcg total) by mouth daily before breakfast.  . metoprolol succinate (TOPROL-XL) 25 MG 24 hr tablet TAKE ONE (1) TABLET BY MOUTH EACH DAY  . NOVOLOG 100 UNIT/ML injection 100 units per day in VGO unknown bolus  . Prenatal Vit-Fe Fumarate-FA (MULTIVITAMIN-PRENATAL) 27-0.8 MG TABS tablet Take 1 tablet by mouth daily. Takes for nail and hair growth   . psyllium (METAMUCIL SMOOTH TEXTURE) 58.6 % powder Take 1 packet by mouth daily.  . RABEprazole (ACIPHEX) 20 MG tablet Take 1 tablet (20 mg total) by mouth daily.  Marland Kitchen spironolactone (ALDACTONE) 25 MG tablet Take 1 tablet (25 mg total) by mouth daily.     Allergies:   Indomethacin, Pregabalin, Sulfa antibiotics, and Morphine and related   Social History   Socioeconomic History  . Marital status: Married    Spouse name: Not on file  . Number of children: Not on file  . Years of education: Not on file  . Highest education level: Not on file  Occupational History  . Occupation: retired  Scientific laboratory technician  . Financial resource strain: Patient refused  . Food insecurity    Worry: Not on file    Inability: Not on file  . Transportation needs    Medical: Not on file    Non-medical: Not on file  Tobacco Use  . Smoking status: Former Smoker    Quit date: 03/01/1998    Years since quitting: 20.7  . Smokeless tobacco: Never Used  Substance and Sexual Activity  . 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  . Drug use: Yes    Types: Marijuana    Comment: remote use  .  Sexual activity: Never  Lifestyle  . Physical activity    Days per week: Not on file    Minutes per session: Not on file  . Stress: Not on file  Relationships  . Social Herbalist on phone: Not on file    Gets together: Not on file    Attends religious service: Not on file    Active member of club or organization: Not on file    Attends meetings of clubs or organizations: Not on file    Relationship status: Not on file  Other Topics Concern  . Not on file  Social History Narrative  . Not on file     Family History: The patient's family history includes Diabetes in her brother, father, and mother; Heart attack in her brother  and father; Heart disease in her father; Hyperlipidemia in her brother, father, and mother. ROS:   Please see the history of present illness.    All 14 point review of systems negative except as described per history of present illness  EKGs/Labs/Other Studies Reviewed:      Recent Labs: 09/09/2018: B Natriuretic Peptide 133.5 10/10/2018: ALT 22; Hemoglobin 10.5; Platelet Count 66 10/27/2018: NT-Pro BNP 621 11/08/2018: BUN 19; Creatinine, Ser 0.98; Potassium 3.8; Sodium 139  Recent Lipid Panel    Component Value Date/Time   CHOL 93 (L) 05/10/2018 0944   TRIG 147 05/10/2018 0944   HDL 31 (L) 05/10/2018 0944   CHOLHDL 3.0 05/10/2018 0944   CHOLHDL 6 04/10/2015 1040   VLDL 50.8 (H) 04/10/2015 1040   LDLCALC 33 05/10/2018 0944   LDLDIRECT 111.0 04/10/2015 1040    Physical Exam:    VS:  BP 110/70   Pulse 76   Ht 5' 2"  (1.575 m)   Wt 240 lb (108.9 kg)   SpO2 93%   BMI 43.90 kg/m     Wt Readings from Last 3 Encounters:  11/17/18 240 lb (108.9 kg)  11/08/18 236 lb 9.6 oz (107.3 kg)  10/20/18 232 lb (105.2 kg)     GEN:  Well nourished, well developed in no acute distress HEENT: Normal NECK: No JVD; No carotid bruits LYMPHATICS: No lymphadenopathy CARDIAC: Irregularly irregular, no murmurs, no rubs, no gallops RESPIRATORY:  Clear to  auscultation without rales, wheezing or rhonchi  ABDOMEN: Soft, non-tender, non-distended MUSCULOSKELETAL:  No edema; No deformity  SKIN: Warm and dry LOWER EXTREMITIES: no swelling NEUROLOGIC:  Alert and oriented x 3 PSYCHIATRIC:  Normal affect   ASSESSMENT:    1. Permanent atrial fibrillation   2. Essential hypertension   3. Hyperlipidemia, unspecified hyperlipidemia type    PLAN:    In order of problems listed above:  1. Permanent atrial fibrillation.  Management will talk about watchman device.  First I would like to know where we stand with her cirrhosis of the liver.  I asked her to go back to her GI specialist so she can have procedures done. 2. Essential hypertension blood pressure controlled continue present management. 3. Dyslipidemia not on statin secondary to chronic liver problem. 4. Cardiomyopathy with ejection fraction 40%.  Will check Chem-7 if Chem-7 is fine will start her on losartan 25.   Medication Adjustments/Labs and Tests Ordered: Current medicines are reviewed at length with the patient today.  Concerns regarding medicines are outlined above.  No orders of the defined types were placed in this encounter.  Medication changes: No orders of the defined types were placed in this encounter.   Signed, Park Liter, MD, [facility] 11/17/2018 4:15 PM    [place]

## 2018-11-17 NOTE — Telephone Encounter (Signed)
Issues with medication has already been resolved, she was seen today

## 2018-11-17 NOTE — Patient Instructions (Signed)
Medication Instructions:  Your physician recommends that you continue on your current medications as directed. Please refer to the Current Medication list given to you today.  If you need a refill on your cardiac medications before your next appointment, please call your pharmacy.   Lab work: Your physician recommends that you return for lab work today: BMP   If you have labs (blood work) drawn today and your tests are completely normal, you will receive your results only by: Marland Kitchen MyChart Message (if you have MyChart) OR . A paper copy in the mail If you have any lab test that is abnormal or we need to change your treatment, we will call you to review the results.  Testing/Procedures: None.   Follow-Up: At [facility], you and your health needs are our priority.  As part of our continuing mission to provide you with exceptional heart care, we have created designated Provider Care Teams.  These Care Teams include your primary Cardiologist (physician) and Advanced Practice Providers (APPs -  Physician Assistants and Nurse Practitioners) who all work together to provide you with the care you need, when you need it. You will need a follow up appointment in 2 months.  Please call our office 2 months in advance to schedule this appointment.  You may see Kirk Ruths, MD or another member of our [place] Provider Team in [place]: Shirlee More, MD . Jyl Heinz, MD  Any Other Special Instructions Will Be Listed Below (If Applicable).

## 2018-11-17 NOTE — Addendum Note (Signed)
Addended by: Ashok Norris on: 11/17/2018 04:20 PM   Modules accepted: Orders

## 2018-11-18 LAB — BASIC METABOLIC PANEL
BUN/Creatinine Ratio: 23 (ref 12–28)
BUN: 24 mg/dL (ref 8–27)
CO2: 26 mmol/L (ref 20–29)
Calcium: 9.8 mg/dL (ref 8.7–10.3)
Chloride: 95 mmol/L — ABNORMAL LOW (ref 96–106)
Creatinine, Ser: 1.04 mg/dL — ABNORMAL HIGH (ref 0.57–1.00)
GFR calc Af Amer: 63 mL/min/{1.73_m2} (ref >59)
GFR calc non Af Amer: 55 mL/min/{1.73_m2} — ABNORMAL LOW (ref >59)
Glucose: 174 mg/dL — ABNORMAL HIGH (ref 65–99)
Potassium: 4.1 mmol/L (ref 3.5–5.2)
Sodium: 135 mmol/L (ref 134–144)

## 2018-11-20 ENCOUNTER — Telehealth: Payer: Self-pay | Admitting: *Deleted

## 2018-11-20 ENCOUNTER — Telehealth: Payer: Self-pay | Admitting: Gastroenterology

## 2018-11-20 DIAGNOSIS — K921 Melena: Secondary | ICD-10-CM

## 2018-11-20 DIAGNOSIS — K7460 Unspecified cirrhosis of liver: Secondary | ICD-10-CM

## 2018-11-20 DIAGNOSIS — R1013 Epigastric pain: Secondary | ICD-10-CM

## 2018-11-20 MED ORDER — LOSARTAN POTASSIUM 25 MG PO TABS: 25.0000 mg | ORAL_TABLET | Freq: Every day | ORAL | 0 refills | Status: DC

## 2018-11-20 NOTE — Telephone Encounter (Signed)
Patient informed of lab results and advised to start taking losartan 25 mg daily. Patient is agreeable and verbalized understanding. Prescription has been sent to [facility] in [facility] as requested. No further questions.

## 2018-11-20 NOTE — Telephone Encounter (Signed)
Left message for patient to call back to the office;  

## 2018-11-20 NOTE — Telephone Encounter (Signed)
-----   Message from Park Liter, MD sent at 11/20/2018  2:02 PM EDT ----- chem7 ok, start losartan 25 mg po qd  If not already on it

## 2018-11-24 ENCOUNTER — Telehealth: Payer: Self-pay

## 2018-11-24 NOTE — Telephone Encounter (Signed)
Cardiac clearance has been sent.

## 2018-11-24 NOTE — Telephone Encounter (Signed)
[facility] Pre-operative Risk Assessment     Request for surgical clearance:     Endoscopy Procedure  What type of surgery is being performed?     EGD/Colon  When is this surgery scheduled?     01/23/19   What type of clearance is required ?   Pharmacy  Are there any medications that need to be held prior to surgery and how long?   Practice name and name of physician performing surgery?      [facility]  What is your office phone and fax number?      Phone- [phone]  [phone]  Anesthesia type (None, local, MAC, general) ?       MAC

## 2018-11-24 NOTE — Telephone Encounter (Signed)
Alexis [name], please arrange for this patient to be scheduled for Dr.Gupta's [facility] get cardiac clearance prior to scheduling her case; Thank you

## 2018-11-24 NOTE — Telephone Encounter (Signed)
Patient was just seen by Dr. Agustin Cree last Friday, she is not on anticoagulation therapy for permanent atrial fibrillation given bleeding risk. Recent EF on echo was mildly decreased. Will check with Dr. Agustin Cree to see if patient can be cleared for this low risk procedure.

## 2018-11-26 NOTE — Telephone Encounter (Signed)
Yes, she can have EGD and colonoscopy done at reasonable risk

## 2018-11-27 NOTE — Telephone Encounter (Signed)
   Primary Cardiologist: Jenne Campus, MD  Chart reviewed as part of pre-operative protocol coverage.  ADELIS DOCTER was last seen on 11/17/18 by Dr. Agustin Cree.  She is not on anticoagulation due to high bleeding risk.    Per Dr. Agustin Cree -  She can have EGD and colonoscopy done at reasonable risk.  Therefore, based on ACC/AHA guidelines, the patient would be at acceptable risk for the planned procedure without further cardiovascular testing.   I will route this recommendation to the requesting party via Epic fax function and remove from pre-op pool.  Please call with questions.  Tami Lin Braxton Weisbecker, PA 11/27/2018, 9:59 AM

## 2018-11-29 ENCOUNTER — Other Ambulatory Visit: Payer: Self-pay | Admitting: Gastroenterology

## 2018-11-29 DIAGNOSIS — L98492 Non-pressure chronic ulcer of skin of other sites with fat layer exposed: Secondary | ICD-10-CM | POA: Diagnosis not present

## 2018-11-29 DIAGNOSIS — K921 Melena: Secondary | ICD-10-CM

## 2018-11-29 DIAGNOSIS — E1142 Type 2 diabetes mellitus with diabetic polyneuropathy: Secondary | ICD-10-CM | POA: Diagnosis not present

## 2018-11-29 DIAGNOSIS — E119 Type 2 diabetes mellitus without complications: Secondary | ICD-10-CM | POA: Diagnosis not present

## 2018-11-29 DIAGNOSIS — R1013 Epigastric pain: Secondary | ICD-10-CM

## 2018-11-29 MED ORDER — SUPREP BOWEL PREP KIT 17.5-3.13-1.6 GM/177ML PO SOLN: 1.0000 | Freq: Once | ORAL | 0 refills | Status: AC

## 2018-11-29 NOTE — Telephone Encounter (Signed)
:  Called and spoke with patient=patient was informed of cardiac clearnace being obtained; patient is in agreement to schedule EGD/colon at [facility]- patient ahs been scheduled on 12/25/2018 at 9:00 amm, arrival at 7:30 am; patient has also been scheduled for her COVID screening on 12/21/2018 at 9:30 am; patient has been mailed instructions and also been sent instructions through [place]; patient was advised to call back to the office should questions/concerns arise; Patient verbalized understanding of information/instructions;

## 2018-11-29 NOTE — Telephone Encounter (Signed)
Cardiac clearance obtained (in chart)- will call the patient and set up procedure at [facility] with Dr. Lyndel Safe

## 2018-12-01 NOTE — Telephone Encounter (Signed)
I believe she is scheduled for EGD at [facility] She will need COVID-19 testing before Please coordinate.  Thx  RG

## 2018-12-09 ENCOUNTER — Other Ambulatory Visit: Payer: Self-pay | Admitting: Medical

## 2018-12-09 DIAGNOSIS — M109 Gout, unspecified: Secondary | ICD-10-CM

## 2018-12-12 ENCOUNTER — Inpatient Hospital Stay: Payer: HMO | Attending: Medical | Admitting: Hematology

## 2018-12-12 ENCOUNTER — Other Ambulatory Visit: Payer: HMO

## 2018-12-12 DIAGNOSIS — E1142 Type 2 diabetes mellitus with diabetic polyneuropathy: Secondary | ICD-10-CM | POA: Diagnosis not present

## 2018-12-12 DIAGNOSIS — L98492 Non-pressure chronic ulcer of skin of other sites with fat layer exposed: Secondary | ICD-10-CM | POA: Diagnosis not present

## 2018-12-18 ENCOUNTER — Other Ambulatory Visit: Payer: Self-pay | Admitting: Pharmacist

## 2018-12-18 ENCOUNTER — Ambulatory Visit: Payer: Self-pay | Admitting: Pharmacist

## 2018-12-18 NOTE — Patient Outreach (Signed)
[facility]) Care Management  12/18/2018  GENOA FREYRE [birth year] [id number]   Patient was called for CSNP medication review. Unfortunately, she did not answer her phone. HIPAA compliant message was left on her voicemail. Patient has been called several times unsuccessfully.  Plan: Call patient back in 4-6 weeks.  Elayne Guerin, PharmD, [place] Clinical Pharmacist [phone]

## 2018-12-21 ENCOUNTER — Telehealth: Payer: Self-pay | Admitting: Gastroenterology

## 2018-12-21 ENCOUNTER — Other Ambulatory Visit (HOSPITAL_COMMUNITY)
Admission: RE | Admit: 2018-12-21 | Discharge: 2018-12-21 | Disposition: A | Discharge status: Home / Self Care | Payer: HMO | Source: Ambulatory Visit | Attending: Gastroenterology | Admitting: Gastroenterology

## 2018-12-21 DIAGNOSIS — Z20828 Contact with and (suspected) exposure to other viral communicable diseases: Secondary | ICD-10-CM | POA: Diagnosis not present

## 2018-12-21 DIAGNOSIS — Z01812 Encounter for preprocedural laboratory examination: Secondary | ICD-10-CM | POA: Insufficient documentation

## 2018-12-21 NOTE — Telephone Encounter (Signed)
I have called patient and told her to come by the [facility] office to pick up a sample.

## 2018-12-21 NOTE — Telephone Encounter (Signed)
Pt is scheduled for 12/25/18 colon and reported that copay for the prep soln is too expensive and that she cannot afford it.

## 2018-12-22 ENCOUNTER — Other Ambulatory Visit: Payer: Self-pay | Admitting: Medical

## 2018-12-22 LAB — NOVEL CORONAVIRUS, NAA (HOSP ORDER, SEND-OUT TO REF LAB; TAT 18-24 HRS): SARS-CoV-2, NAA: NOT DETECTED

## 2018-12-22 NOTE — Progress Notes (Signed)
Pro op call complete. Patient states they have been quarantined since COVID test and will remain quarantined over weekend. All questions addressed.

## 2018-12-25 ENCOUNTER — Encounter (HOSPITAL_COMMUNITY)
Admission: RE | Disposition: A | Discharge status: Home / Self Care | Payer: Self-pay | Source: Home / Self Care | Attending: Gastroenterology

## 2018-12-25 ENCOUNTER — Ambulatory Visit (HOSPITAL_COMMUNITY): Payer: HMO | Admitting: Registered Nurse

## 2018-12-25 ENCOUNTER — Ambulatory Visit (HOSPITAL_COMMUNITY)
Admission: RE | Admit: 2018-12-25 | Discharge: 2018-12-25 | Disposition: A | Discharge status: Home / Self Care | Payer: HMO | Attending: Gastroenterology | Admitting: Gastroenterology

## 2018-12-25 ENCOUNTER — Other Ambulatory Visit: Payer: Self-pay

## 2018-12-25 ENCOUNTER — Encounter (HOSPITAL_COMMUNITY): Payer: Self-pay | Admitting: *Deleted

## 2018-12-25 DIAGNOSIS — Z885 Allergy status to narcotic agent status: Secondary | ICD-10-CM | POA: Insufficient documentation

## 2018-12-25 DIAGNOSIS — K643 Fourth degree hemorrhoids: Secondary | ICD-10-CM | POA: Insufficient documentation

## 2018-12-25 DIAGNOSIS — D500 Iron deficiency anemia secondary to blood loss (chronic): Secondary | ICD-10-CM

## 2018-12-25 DIAGNOSIS — K3189 Other diseases of stomach and duodenum: Secondary | ICD-10-CM | POA: Diagnosis not present

## 2018-12-25 DIAGNOSIS — R188 Other ascites: Secondary | ICD-10-CM | POA: Diagnosis not present

## 2018-12-25 DIAGNOSIS — Z79890 Hormone replacement therapy (postmenopausal): Secondary | ICD-10-CM | POA: Insufficient documentation

## 2018-12-25 DIAGNOSIS — Z96659 Presence of unspecified artificial knee joint: Secondary | ICD-10-CM | POA: Insufficient documentation

## 2018-12-25 DIAGNOSIS — I110 Hypertensive heart disease with heart failure: Secondary | ICD-10-CM | POA: Diagnosis not present

## 2018-12-25 DIAGNOSIS — I252 Old myocardial infarction: Secondary | ICD-10-CM | POA: Insufficient documentation

## 2018-12-25 DIAGNOSIS — K766 Portal hypertension: Secondary | ICD-10-CM | POA: Diagnosis not present

## 2018-12-25 DIAGNOSIS — Z87891 Personal history of nicotine dependence: Secondary | ICD-10-CM | POA: Insufficient documentation

## 2018-12-25 DIAGNOSIS — K7460 Unspecified cirrhosis of liver: Secondary | ICD-10-CM | POA: Diagnosis not present

## 2018-12-25 DIAGNOSIS — I8510 Secondary esophageal varices without bleeding: Secondary | ICD-10-CM | POA: Diagnosis not present

## 2018-12-25 DIAGNOSIS — I509 Heart failure, unspecified: Secondary | ICD-10-CM | POA: Insufficient documentation

## 2018-12-25 DIAGNOSIS — M1990 Unspecified osteoarthritis, unspecified site: Secondary | ICD-10-CM | POA: Diagnosis not present

## 2018-12-25 DIAGNOSIS — Z888 Allergy status to other drugs, medicaments and biological substances status: Secondary | ICD-10-CM | POA: Insufficient documentation

## 2018-12-25 DIAGNOSIS — Z79899 Other long term (current) drug therapy: Secondary | ICD-10-CM | POA: Insufficient documentation

## 2018-12-25 DIAGNOSIS — K5730 Diverticulosis of large intestine without perforation or abscess without bleeding: Secondary | ICD-10-CM | POA: Insufficient documentation

## 2018-12-25 DIAGNOSIS — K921 Melena: Secondary | ICD-10-CM | POA: Insufficient documentation

## 2018-12-25 DIAGNOSIS — Z6839 Body mass index (BMI) 39.0-39.9, adult: Secondary | ICD-10-CM | POA: Insufficient documentation

## 2018-12-25 DIAGNOSIS — Z794 Long term (current) use of insulin: Secondary | ICD-10-CM | POA: Diagnosis not present

## 2018-12-25 DIAGNOSIS — E1142 Type 2 diabetes mellitus with diabetic polyneuropathy: Secondary | ICD-10-CM | POA: Diagnosis not present

## 2018-12-25 DIAGNOSIS — I8500 Esophageal varices without bleeding: Secondary | ICD-10-CM | POA: Diagnosis not present

## 2018-12-25 DIAGNOSIS — F419 Anxiety disorder, unspecified: Secondary | ICD-10-CM | POA: Insufficient documentation

## 2018-12-25 DIAGNOSIS — J449 Chronic obstructive pulmonary disease, unspecified: Secondary | ICD-10-CM | POA: Diagnosis not present

## 2018-12-25 DIAGNOSIS — E039 Hypothyroidism, unspecified: Secondary | ICD-10-CM | POA: Insufficient documentation

## 2018-12-25 DIAGNOSIS — E1151 Type 2 diabetes mellitus with diabetic peripheral angiopathy without gangrene: Secondary | ICD-10-CM | POA: Diagnosis not present

## 2018-12-25 DIAGNOSIS — Z950 Presence of cardiac pacemaker: Secondary | ICD-10-CM | POA: Insufficient documentation

## 2018-12-25 DIAGNOSIS — Z882 Allergy status to sulfonamides status: Secondary | ICD-10-CM | POA: Insufficient documentation

## 2018-12-25 DIAGNOSIS — I2510 Atherosclerotic heart disease of native coronary artery without angina pectoris: Secondary | ICD-10-CM | POA: Insufficient documentation

## 2018-12-25 HISTORY — PX: ESOPHAGEAL BANDING: SHX5518

## 2018-12-25 HISTORY — PX: COLONOSCOPY WITH PROPOFOL: SHX5780

## 2018-12-25 HISTORY — PX: ESOPHAGOGASTRODUODENOSCOPY (EGD) WITH PROPOFOL: SHX5813

## 2018-12-25 LAB — CBC
HCT: 40.9 % (ref 36.0–46.0)
Hemoglobin: 12.7 g/dL (ref 12.0–15.0)
MCH: 31.6 pg (ref 26.0–34.0)
MCHC: 31.1 g/dL (ref 30.0–36.0)
MCV: 101.7 fL — ABNORMAL HIGH (ref 80.0–100.0)
Platelets: 64 10*3/uL — ABNORMAL LOW (ref 150–400)
RBC: 4.02 MIL/uL (ref 3.87–5.11)
RDW: 16.9 % — ABNORMAL HIGH (ref 11.5–15.5)
WBC: 4.8 10*3/uL (ref 4.0–10.5)
nRBC: 0 % (ref 0.0–0.2)

## 2018-12-25 LAB — PROTIME-INR
INR: 1.2 (ref 0.8–1.2)
Prothrombin Time: 14.7 seconds (ref 11.4–15.2)

## 2018-12-25 LAB — COMPREHENSIVE METABOLIC PANEL
ALT: 26 U/L (ref 0–44)
AST: 57 U/L — ABNORMAL HIGH (ref 15–41)
Albumin: 3.8 g/dL (ref 3.5–5.0)
Alkaline Phosphatase: 156 U/L — ABNORMAL HIGH (ref 38–126)
Anion gap: 10 (ref 5–15)
BUN: 16 mg/dL (ref 8–23)
CO2: 26 mmol/L (ref 22–32)
Calcium: 9.3 mg/dL (ref 8.9–10.3)
Chloride: 99 mmol/L (ref 98–111)
Creatinine, Ser: 0.78 mg/dL (ref 0.44–1.00)
GFR calc Af Amer: >60 mL/min (ref >60)
GFR calc non Af Amer: >60 mL/min (ref >60)
Glucose, Bld: 162 mg/dL — ABNORMAL HIGH (ref 70–99)
Potassium: 3.7 mmol/L (ref 3.5–5.1)
Sodium: 135 mmol/L (ref 135–145)
Total Bilirubin: 1.3 mg/dL — ABNORMAL HIGH (ref 0.3–1.2)
Total Protein: 8 g/dL (ref 6.5–8.1)

## 2018-12-25 LAB — GLUCOSE, CAPILLARY: Glucose-Capillary: 141 mg/dL — ABNORMAL HIGH (ref 70–99)

## 2018-12-25 IMAGING — CT CT ANGIO EXTREM LOW*R*
2 of 5 series · 11 of 42 positions shown, 12 images · IV contrast (agent unspecified)
Comparison: None.

CONTRAST:  125mL L7DJZD-VP8 IOPAMIDOL (L7DJZD-VP8) INJECTION 76%

CLINICAL DATA: 66-year-old female with a history of left ankle pain
and leg cramping with cold leg. History of known peripheral vascular
disease/critical limb ischemia

EXAM:
CT OF THE LOWER BILATERAL EXTREMITY WITH CONTRAST
TECHNIQUE: Multidetector CT imaging of the lower bilateral extremity was
performed according to the standard protocol following intravenous
contrast administration.

[Series 5: runoff axial arterial · axial · arterial · 0.86mm/px · z∈[-692,+94]mm · 8 of 304 slices shown, 9 images]
[im 21/304  soft-tissue]
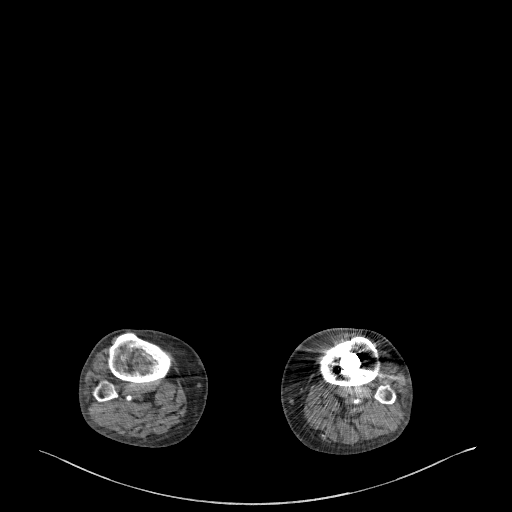
[im 21/304  bone]
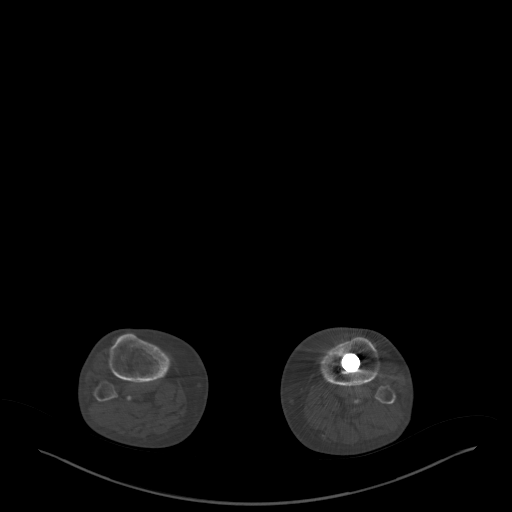
[im 63/304  soft-tissue]
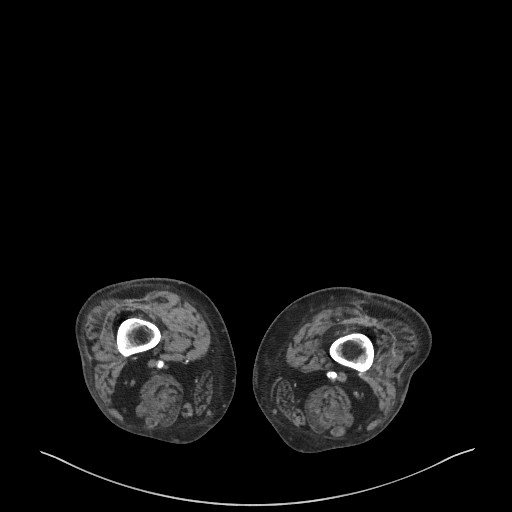
[im 95/304  soft-tissue]
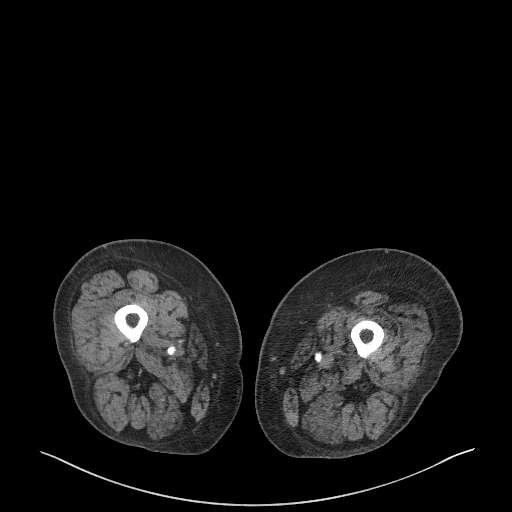
[im 136/304  soft-tissue]
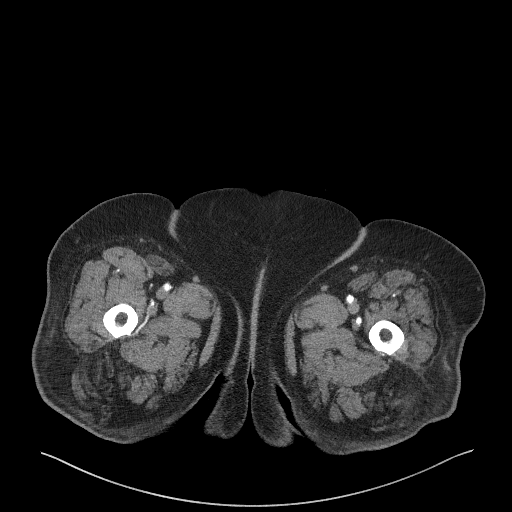
[im 168/304  soft-tissue]
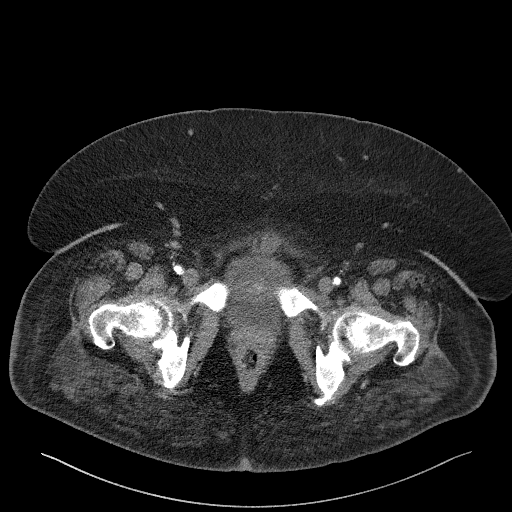
[im 209/304  soft-tissue]
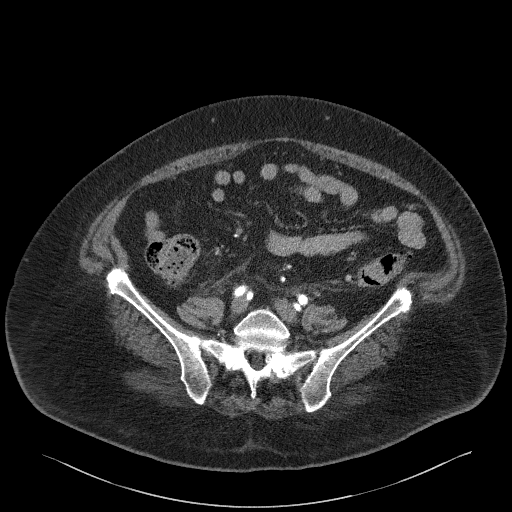
[im 241/304  soft-tissue]
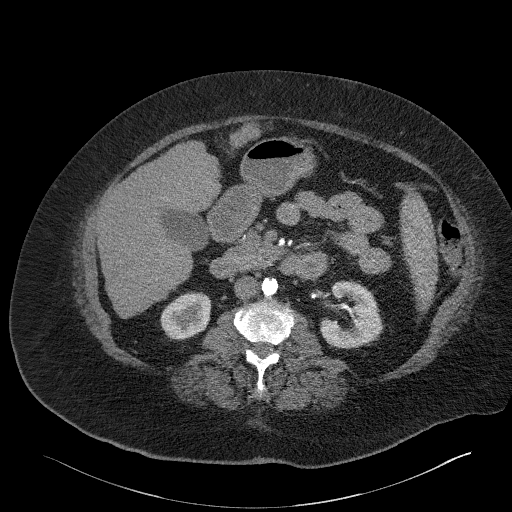
[im 283/304  soft-tissue]
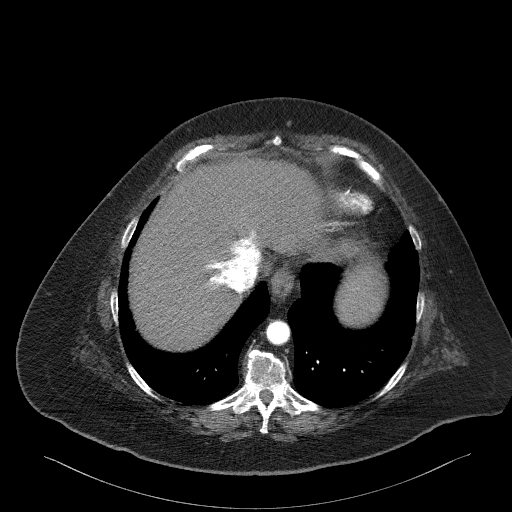

[Series 7: coronal upper · coronal · 1.07mm/px · 3 of 152 slices shown]
[im 31/152  soft-tissue]
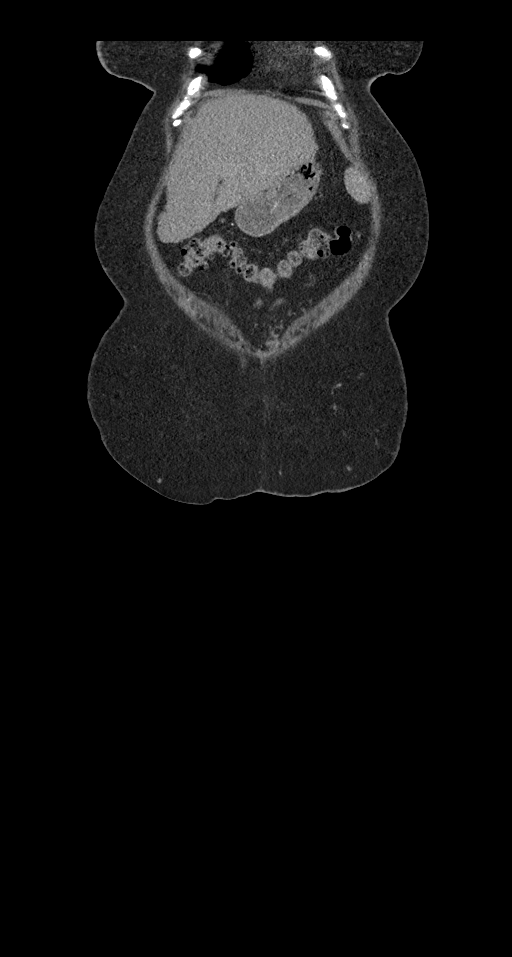
[im 61/152  soft-tissue]
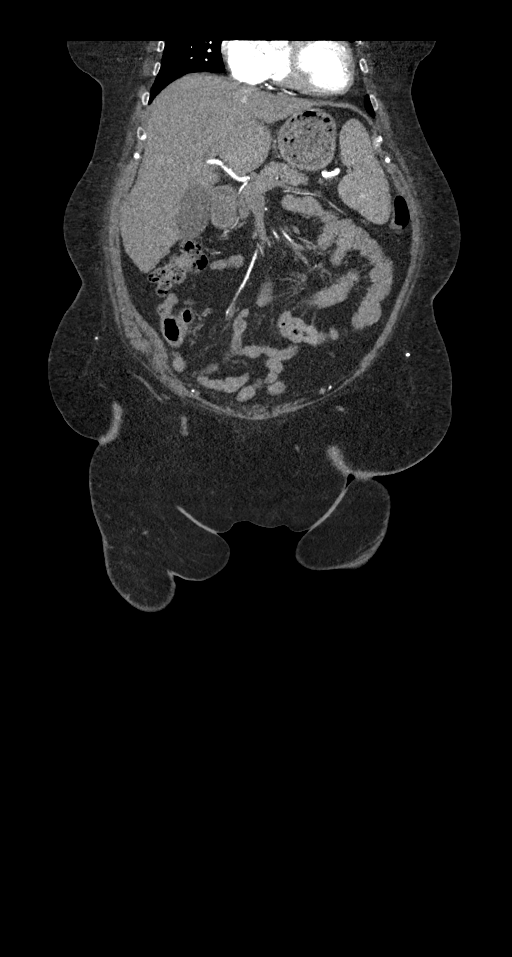
[im 91/152  soft-tissue]
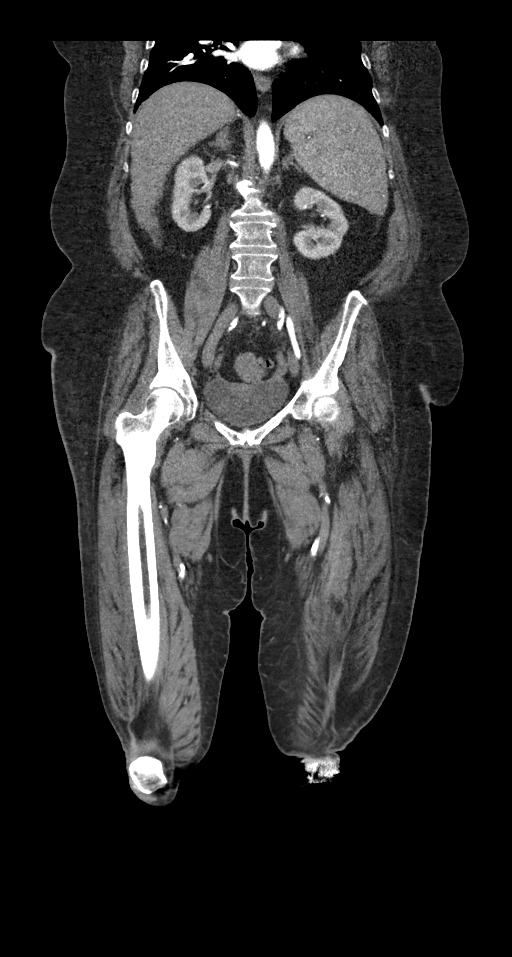

[11 of 42 positions shown; findings below may reference images not displayed]

FINDINGS: VASCULAR

Aorta: Distal thoracic aorta unremarkable. Diameter at the hiatus
measures 19 mm.

Mild atherosclerotic changes of the abdominal aorta with no
dissection or aneurysm. No periaortic fluid.

Celiac: Minimal atherosclerotic changes at the celiac artery origin.
Branch vessels are patent.

SMA: No significant atherosclerotic changes at the origin of the
superior mesenteric artery. There is mild plaque in the mid SMA,
above the origin of the arcades.

Renals: Mild a moderate atherosclerotic changes of the left main
renal artery, with no significant stenosis. Mild atherosclerotic
changes at the origin of the main right renal artery with perhaps
50% stenosis. There is an accessory right renal artery inferior pole
with no significant stenosis by CT imaging.

IMA: Mild plaque at the origin of the inferior mesenteric artery
which remains patent.

Right lower extremity:

Mild atherosclerotic changes of the right iliac system. Hypogastric
artery remains patent with no significant stenosis at the origin.
Pelvic vasculature patent.

Common femoral artery with mild atherosclerotic changes and no
significant stenosis.

Profunda femoris patent.

The femoropopliteal system remains patent, with mild a moderate
atherosclerotic changes. There are at least 2 focal regions of what
may be high-grade stenosis secondary to atherosclerotic change of
the SFA, and 1 focal stenosis of the popliteal artery.

Atherosclerotic tibial disease. There is occlusion of the proximal
anterior tibial artery, with reconstitution distally secondary to
the peroneal artery.

Peroneal artery appears patent from the origin to the ankle. Small
caliber posterior tibial artery, which appears patent throughout its
course into the plantar artery.

Left lower extremity:

Mild atherosclerotic changes of the left iliac system. Hypogastric
artery remains patent. Pelvic vasculature patent. External iliac
artery patent.

Mild atherosclerotic changes of the common femoral artery which is
patent.

Profunda femoris patent.

Moderate atherosclerotic changes of the right superficial femoral
artery, with at least 1 area of stenosis in the proximal third and
an occlusion of the SFA in the adductor canal.

Reconstitution of the popliteal artery. Evaluation of popliteal
arteries limited at the knee secondary to artifact.

Peroneal artery appears patent to the ankle.

Posterior tibial artery is occluded proximally, with reconstitution
distally via peroneal flow.

Anterior tibial artery appears patent to the ankle.

Veins: Unremarkable appearance of the venous system.

Review of the MIP images confirms the above findings.

NON-VASCULAR

Lower chest: Calcifications of the right coronary artery.
Incompletely visualized pacing leads of the right ventricle and the
coronary sinus. Respiratory motion at the lung bases somewhat limits
evaluation.

Hepatobiliary: Nodular contour of liver parenchyma. Enlargement of
the caudate lobe. Cholelithiasis without evidence of acute
inflammatory change.

Pancreas: Unremarkable pancreas

Spleen: Unremarkable spleen

Adrenals/Urinary Tract: Unremarkable bilateral adrenal glands

Right:

No hydronephrosis. Symmetric perfusion to the left. No
nephrolithiasis. Unremarkable course of the right ureter.

Left:

No hydronephrosis. Symmetric perfusion to the right. No
nephrolithiasis. Unremarkable course of the left ureter.

Unremarkable appearance of the urinary bladder .

Stomach/Bowel: Unremarkable appearance of the stomach. Unremarkable
appearance of small bowel. No evidence of obstruction. Colonic
diverticula throughout the length of the colon. No significant stool
burden. There are circumferential wall thickening of the sigmoid
colon with mild inflammatory changes within the fat. Normal appendix

Lymphatic: No adenopathy

Mesenteric: No free fluid.  No free air.

Reproductive: Unremarkable appearance of the pelvic organs.

Other: Small fat containing umbilical hernia.

Musculoskeletal: No acute displaced fracture. Degenerative changes
of the spine. No bony canal narrowing. Surgical changes of prior
right second toe amputation
IMPRESSION: Multilevel peripheral vascular disease, including:

- Short-segment left SFA occlusion at the adductor canal, with
reconstitution of the below knee popliteal artery.

-mild aortic and aortoiliac disease with no significant stenosis.
Aortic Atherosclerosis ([id number]).

-bilateral mild a moderate femoropopliteal disease, with evidence of
developing stenoses bilaterally in addition to the left adductor
canal occlusion.

-bilateral tibial disease, including right anterior tibial artery
occlusion, left posterior tibial artery occlusion, and patency of at
least 1 tibial vessel to the ankle on both the left and the right.

-bilateral renal artery disease, likely worst on the left where
there [DATE]% stenosis at the renal artery origin.

Diverticular disease with questionable early inflammatory changes of
the sigmoid colon. If there is concern for acute diverticulitis,
recommend correlation with lab values and patient presentation.

Cirrhosis.

Cholelithiasis without evidence of acute cholecystitis..

Multilevel degenerative changes of the lumbar spine without
significant bony canal narrowing.

## 2018-12-25 SURGERY — ESOPHAGOGASTRODUODENOSCOPY (EGD) WITH PROPOFOL
Anesthesia: Monitor Anesthesia Care

## 2018-12-25 MED ORDER — LACTATED RINGERS IV SOLN
INTRAVENOUS | Status: DC
  Administered 2018-12-25: 09:00:00 100 mL via INTRAVENOUS

## 2018-12-25 MED ORDER — ONDANSETRON HCL 4 MG/2ML IJ SOLN
INTRAMUSCULAR | Status: DC | PRN
  Administered 2018-12-25: 4 mg via INTRAVENOUS

## 2018-12-25 MED ORDER — PHENYLEPHRINE 40 MCG/ML (10ML) SYRINGE FOR IV PUSH (FOR BLOOD PRESSURE SUPPORT)
PREFILLED_SYRINGE | INTRAVENOUS | Status: DC | PRN
  Administered 2018-12-25: 80 ug via INTRAVENOUS

## 2018-12-25 MED ORDER — PROPOFOL 500 MG/50ML IV EMUL
INTRAVENOUS | Status: AC
  Filled 2018-12-25: qty 50

## 2018-12-25 MED ORDER — PROPOFOL 500 MG/50ML IV EMUL
INTRAVENOUS | Status: DC | PRN
  Administered 2018-12-25: 100 ug/kg/min via INTRAVENOUS

## 2018-12-25 MED ORDER — ALBUTEROL SULFATE HFA 108 (90 BASE) MCG/ACT IN AERS: 2.0000 | INHALATION_SPRAY | Freq: Four times a day (QID) | RESPIRATORY_TRACT | 1 refills | Status: DC | PRN

## 2018-12-25 MED ORDER — SODIUM CHLORIDE 0.9 % IV SOLN: INTRAVENOUS | Status: DC

## 2018-12-25 MED ORDER — LIDOCAINE 2% (20 MG/ML) 5 ML SYRINGE
INTRAMUSCULAR | Status: DC | PRN
  Administered 2018-12-25: 100 mg via INTRAVENOUS

## 2018-12-25 MED ORDER — PROPOFOL 10 MG/ML IV BOLUS
INTRAVENOUS | Status: DC | PRN
  Administered 2018-12-25 (×2): 10 mg via INTRAVENOUS

## 2018-12-25 SURGICAL SUPPLY — 25 items

## 2018-12-25 NOTE — Discharge Instructions (Signed)
YOU HAD AN ENDOSCOPIC PROCEDURE TODAY: Refer to the procedure report and other information in the discharge instructions given to you for any specific questions about what was found during the examination. If this information does not answer your questions, please call [place] office at [phone] to clarify.  ° °YOU SHOULD EXPECT: Some feelings of bloating in the abdomen. Passage of more gas than usual. Walking can help get rid of the air that was put into your GI tract during the procedure and reduce the bloating. If you had a lower endoscopy (such as a colonoscopy or flexible sigmoidoscopy) you may notice spotting of blood in your stool or on the toilet paper. Some abdominal soreness may be present for a day or two, also. ° °DIET: Your first meal following the procedure should be a light meal and then it is ok to progress to your normal diet. A half-sandwich or bowl of soup is an example of a good first meal. Heavy or fried foods are harder to digest and may make you feel nauseous or bloated. Drink plenty of fluids but you should avoid alcoholic beverages for 24 hours. If you had a esophageal dilation, please see attached instructions for diet.   ° °ACTIVITY: Your care partner should take you home directly after the procedure. You should plan to take it easy, moving slowly for the rest of the day. You can resume normal activity the day after the procedure however YOU SHOULD NOT DRIVE, use power tools, machinery or perform tasks that involve climbing or major physical exertion for 24 hours (because of the sedation medicines used during the test).  ° °SYMPTOMS TO REPORT IMMEDIATELY: °A gastroenterologist can be reached at any hour. Please call [phone]  for any of the following symptoms:  °Following lower endoscopy (colonoscopy, flexible sigmoidoscopy) °Excessive amounts of blood in the stool  °Significant tenderness, worsening of abdominal pains  °Swelling of the abdomen that is new, acute  °Fever of 100° or  higher  °Following upper endoscopy (EGD, EUS, ERCP, esophageal dilation) °Vomiting of blood or coffee ground material  °New, significant abdominal pain  °New, significant chest pain or pain under the shoulder blades  °Painful or persistently difficult swallowing  °New shortness of breath  °Black, tarry-looking or red, bloody stools ° °FOLLOW UP:  °If any biopsies were taken you will be contacted by phone or by letter within the next 1-3 weeks. Call [phone]  if you have not heard about the biopsies in 3 weeks.  °Please also call with any specific questions about appointments or follow up tests. ° °

## 2018-12-25 NOTE — Op Note (Signed)
[facility] Patient Name: Alexis [name] Procedure Date: 12/25/2018 MRN: [id number] Attending MD: Jackquline Denmark , MD Date of Birth: 1950-12-14 CSN: [id number] Age: 68 Admit Type: Outpatient Procedure:                Colonoscopy Indications:              Melena Providers:                Jackquline Denmark, MD, Grace Isaac, RN, William Dalton, Technician Referring MD:              Medicines:                Monitored Anesthesia Care Complications:            No immediate complications. Estimated Blood Loss:     Estimated blood loss: none. Procedure:                Pre-Anesthesia Assessment:                           - Prior to the procedure, a History and Physical                            was performed, and patient medications and                            allergies were reviewed. The patient's tolerance of                            previous anesthesia was also reviewed. The risks                            and benefits of the procedure and the sedation                            options and risks were discussed with the patient.                            All questions were answered, and informed consent                            was obtained. Prior Anticoagulants: The patient has                            taken no previous anticoagulant or antiplatelet                            agents. ASA Grade Assessment: IV - A patient with                            severe systemic disease that is a constant threat                            to life. After  reviewing the risks and benefits,                            the patient was deemed in satisfactory condition to                            undergo the procedure.                           After obtaining informed consent, the colonoscope                            was passed under direct vision. Throughout the                            procedure, the patient's blood pressure, pulse, and                    oxygen saturations were monitored continuously. The                            [id number]-H190DL ([id number] Olympus pediatric colonscope                            was introduced through the anus and advanced to the                            2 cm into the ileum. The colonoscopy was performed                            without difficulty. The patient tolerated the                            procedure well. The quality of the bowel                            preparation was good. The terminal ileum, ileocecal                            valve, appendiceal orifice, and rectum were                            photographed. Scope In: 9:42:38 AM Scope Out: 9:55:03 AM Scope Withdrawal Time: 0 hours 8 minutes 22 seconds  Total Procedure Duration: 0 hours 12 minutes 25 seconds  Findings:      The colon (entire examined portion) was edematous but normal. No       bleeding.      A few small-mouthed diverticula were found in the sigmoid colon,       transverse colon and ascending colon.      Non-bleeding internal hemorrhoids were found during retroflexion and       during perianal exam. The hemorrhoids were Grade IV (internal       hemorrhoids that prolapse and cannot be reduced manually).      The terminal ileum appeared normal.      The exam was otherwise without  abnormality on direct and retroflexion       views. Impression:               -Mild portal hypertensive colopathy (edema)                           -Mild pancolonic diverticulosis.                           -Grade 4 nonbleeding internal hemorrhoids.                           -Otherwise normal colonoscopy to TI. Moderate Sedation:      Not Applicable - Patient had care per Anesthesia. Recommendation:           - Patient has a contact number available for                            emergencies. The signs and symptoms of potential                            delayed complications were discussed with the                             patient. Return to normal activities tomorrow.                            Written discharge instructions were provided to the                            patient.                           - Resume previous diet.                           - Continue present medications.                           - Return to GI clinic in 2 weeks. Procedure Code(s):        --- Professional ---                           [id number], Colonoscopy, flexible; diagnostic, including                            collection of specimen(s) by brushing or washing,                            when performed (separate procedure) Diagnosis Code(s):        --- Professional ---                           K64.3, Fourth degree hemorrhoids                           K92.1, Melena (includes Hematochezia)  K57.30, Diverticulosis of large intestine without                            perforation or abscess without bleeding CPT copyright 2019 [employer]. All rights reserved. The codes documented in this report are preliminary and upon coder review may  be revised to meet current compliance requirements. Jackquline Denmark, MD 12/25/2018 10:02:28 AM This report has been signed electronically. Number of Addenda: 0

## 2018-12-25 NOTE — Op Note (Signed)
[facility] Patient Name: Alexis [name] Procedure Date: 12/25/2018 MRN: [id number] Attending MD: Jackquline Denmark , MD Date of Birth: Nov 15, 1950 CSN: [id number] Age: 68 Admit Type: Outpatient Procedure:                Upper GI endoscopy Indications:              Iron deficiency anemia secondary to chronic blood                            loss Providers:                Jackquline Denmark, MD, Grace Isaac, RN, William Dalton, Technician Referring MD:              Medicines:                Monitored Anesthesia Care Complications:            No immediate complications. Estimated Blood Loss:     Estimated blood loss: none. Procedure:                Pre-Anesthesia Assessment:                           - Prior to the procedure, a History and Physical                            was performed, and patient medications and                            allergies were reviewed. The patient's tolerance of                            previous anesthesia was also reviewed. The risks                            and benefits of the procedure and the sedation                            options and risks were discussed with the patient.                            All questions were answered, and informed consent                            was obtained. Prior Anticoagulants: The patient has                            taken no previous anticoagulant or antiplatelet                            agents. ASA Grade Assessment: IV - A patient with                            severe systemic  disease that is a constant threat                            to life. After reviewing the risks and benefits,                            the patient was deemed in satisfactory condition to                            undergo the procedure.                           After obtaining informed consent, the endoscope was                            passed under direct vision. Throughout the                     procedure, the patient's blood pressure, pulse, and                            oxygen saturations were monitored continuously. The                            [id number]-[id number] ([id number]) Olympus gastroscope was                            introduced through the mouth, and advanced to the                            second part of duodenum. The upper GI endoscopy was                            accomplished without difficulty. The patient                            tolerated the procedure well. Scope In: Scope Out: Findings:      Three columns of non-bleeding grade III varices were found in the mid       esophagus and in the distal esophagus,. They were 6-8 mm in largest       diameter. No stigmata of recent bleeding were evident, one varix with a       small red wale sign. Six bands (2 on each varix) were successfully       placed with complete eradication, resulting in deflation of varices.       There was no bleeding at the end of the procedure.      Moderate portal hypertensive gastropathy was found in the entire       examined stomach. No fundal varices.      The examined duodenum was normal. Impression:               - Non-bleeding grade III esophageal varices.                            Completely eradicated. Banded.                           -  Portal hypertensive gastropathy. Moderate Sedation:      Not Applicable - Patient had care per Anesthesia. Recommendation:           - Patient has a contact number available for                            emergencies. The signs and symptoms of potential                            delayed complications were discussed with the                            patient. Return to normal activities tomorrow.                            Written discharge instructions were provided to the                            patient.                           - Resume previous diet.                           - Continue present medications.                            - No ibuprofen, naproxen, or other non-steroidal                            anti-inflammatory drugs.                           - Repeat upper endoscopy in 4-6 weeks for rpt                            endoscopic band ligation (if needed)                           - Check CBC, CMP and PT today.                           - Return to GI clinic in 2 weeks.                           - D/W Amy Freedom [phone] Procedure Code(s):        --- Professional ---                           [phone], Esophagogastroduodenoscopy, flexible,                            transoral; with band ligation of esophageal/gastric                            varices Diagnosis Code(s):        --- Professional ---  I85.00, Esophageal varices without bleeding                           K76.6, Portal hypertension                           K31.89, Other diseases of stomach and duodenum                           D50.0, Iron deficiency anemia secondary to blood                            loss (chronic) CPT copyright 2019 American Medical Association. All rights reserved. The codes documented in this report are preliminary and upon coder review may  be revised to meet current compliance requirements. Jackquline Denmark, MD 12/25/2018 10:15:07 AM This report has been signed electronically. Number of Addenda: 0

## 2018-12-25 NOTE — Anesthesia Preprocedure Evaluation (Signed)
Anesthesia Evaluation  Patient identified by MRN, date of birth, ID band Patient awake    Reviewed: Allergy & Precautions, NPO status , Patient's Chart, lab work & pertinent test results  History of Anesthesia Complications (+) PONV and history of anesthetic complications  Airway Mallampati: III  TM Distance: >3 FB Neck ROM: Full    Dental no notable dental hx. (+) Upper Dentures   Pulmonary asthma , COPD (mild, controlled), former smoker,    Pulmonary exam normal breath sounds clear to auscultation       Cardiovascular hypertension, Pt. on home beta blockers + CAD, + Past MI and + Peripheral Vascular Disease  Normal cardiovascular exam+ dysrhythmias Atrial Fibrillation + pacemaker  Rhythm:Regular Rate:Normal  ECHO: MobLeft ventricle: The cavity size was normal. Wall thickness was normal. Systolic function was normal. The estimated ejection fraction was in the range of 50% to 55%. - Left atrium: The atrium was mildly dilated. - Right ventricle: The cavity size was mildly dilated. - Atrial septum: No defect or patent foramen ovale was identified. - Impressions: Overall poor image qualityitz II,  ECG: second degree AV block. Rate 109    Neuro/Psych Anxiety negative neurological ROS     GI/Hepatic Neg liver ROS,   Endo/Other  diabetes, Insulin DependentHypothyroidism Morbid obesity  Renal/GU negative Renal ROS  negative genitourinary   Musculoskeletal negative musculoskeletal ROS (+)   Abdominal (+) + obese,   Peds negative pediatric ROS (+)  Hematology negative hematology ROS (+)   Anesthesia Other Findings Obese, BMI 41 Gout Dr. Curt Bears, Cardiology.  Reproductive/Obstetrics negative OB ROS                             Anesthesia Physical  Anesthesia Plan  ASA: III  Anesthesia Plan: MAC   Post-op Pain Management:    Induction: Intravenous  PONV Risk Score and Plan:  Ondansetron  Airway Management Planned: Nasal Cannula, Natural Airway and Mask  Additional Equipment:   Intra-op Plan:   Post-operative Plan:   Informed Consent: I have reviewed the patients History and Physical, chart, labs and discussed the procedure including the risks, benefits and alternatives for the proposed anesthesia with the patient or authorized representative who has indicated his/her understanding and acceptance.     Dental advisory given  Plan Discussed with: CRNA  Anesthesia Plan Comments:         Anesthesia Quick Evaluation

## 2018-12-25 NOTE — Transfer of Care (Signed)
Immediate Anesthesia Transfer of Care Note  Patient: ALEXY BRINGLE  Procedure(s) Performed: ESOPHAGOGASTRODUODENOSCOPY (EGD) WITH PROPOFOL (N/A ) COLONOSCOPY WITH PROPOFOL (N/A ) ESOPHAGEAL BANDING  Patient Location: PACU and Endoscopy Unit  Anesthesia Type:MAC  Level of Consciousness: awake, alert , oriented and patient cooperative  Airway & Oxygen Therapy: Patient Spontanous Breathing and Patient connected to face mask oxygen  Post-op Assessment: Report given to RN and Post -op Vital signs reviewed and stable  Post vital signs: Reviewed and stable  Last Vitals:  Vitals Value Taken Time  BP 185/87 12/25/18 1002  Temp    Pulse    Resp 20 12/25/18 1003  SpO2    Vitals shown include unvalidated device data.  Last Pain:  Vitals:   12/25/18 0828  TempSrc: Oral  PainSc: 0-No pain         Complications: No apparent anesthesia complications

## 2018-12-25 NOTE — H&P (Signed)
For EGD and colonoscopy today.  I explained risks and benefits.  She wishes to proceed.  Updated history and physical.  RG             Chief Complaint: Abnormal CT, melena  Referring Provider:  Mackie Pai, PA-C      ASSESSMENT AND PLAN;   #1.  Liver cirrhosis d/t  R sided CHF, NASH. R/O other etiologies (no ETOH x 20 yrs). Dx on CT 08/2018, with portal hypertension in form of splenomegaly with hypersplenism. Trace ascites. No definite varices or clinical HE.  Has pancytopenia.  #2. Epigastric pain. ? Etiology.  Has gallstones.  History doesn't sound like biliary colic. H/O HH and had responded well to AcipHex in the past.  #3. ?melena but on iron supplements. Hb stable.  #4.  Comorbid conditions include dCHF, PVD, HTN, A. fib, HLD, PAD, DM 2, CAD, pacemaker and obesity.   Plan: - Aciphex 70m po qd 30, 11 refiils. - Check acute viral hepatitis, autoimmune hepatitis panel (AMA, ASMA and anti-LKM), ANA, iron studies, serum ceruloplasmin, A1AT, celiac screen and GGT. -Check ammonia, alpha-fetoprotein and PT INR. -Check anti-HAV total Ab and HBsAb.  If negative, would recommend vaccination for hepatitis A and B. -Hemoccult cards x 3. -[facility]  -Refusing colon (can't take prep).  She does understand small but definite risks of missing colorectal neoplasms. -CBC, CMP at time of EGD. -Continue current dose of Lasix (478mpo qd) as per cardiology. -Salt restricted diet.    HPI:    Alexis [name] a [age].o. female  With newly diagnosed liver cirrhosis on CT Seen by Dr. ZhMaylon [name] has been having questionable melena (has been taking iron supplements as well) for a few weeks which has resolved per patient.  She currently has "normal" dark stools.  Does complain of epigastric pain.  No nausea or vomiting.  Also has heartburn.  She was given trial of Protonix which she could not tolerate.  She has done well in the past with  AcipHex.  No odynophagia or dysphagia.  No jaundice dark urine or pale stools.  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  After that she did not drink any alcohol.  She is alcohol free for last 20 years.  To me she denies having any history of hematochezia.  Has more constipation but is better on Metamucil.  No change in mental status.  Has history of easy bruisability.  No history of itching, skin lesions, easy bruisability, intake of over-the-counter medications including diet pills, herbal medications, anabolic steroids or Tylenol.  There is no history of blood transfusions, IV drug use or family history of liver disease.  No jaundice dark urine or pale stools.  Recently seen by cardiology and vascular surgery as well.  Has chronic mild shortness of breath.  Never had colonoscopy.  She would like to hold off on colonoscopy.  She had EGD over 20 years ago which according to the patient showed hiatal hernia.  Did very well with AcipHex at that time.  No nonsteroidals.   2DE 09/2018: (Dr K)Alexis [name]. The right ventricle has moderately reduced systolic function. The cavity was mildly enlarged. 2. Right atrial size was severely dilated. 3. Small pericardial effusion. 4. EF 40%  CT AP with contrast 09/28/2018: 1. Sigmoid diverticulosis. 2. Stigmata of cirrhosis and splenomegaly (15.6 cm). Trace ascites. 3. Gallstone near the gallbladder  neck     Past Medical History:  Diagnosis Date   Allergy    Anxiety    Arthritis    Asthma    Atrial fibrillation (HCC)    CHF (congestive heart failure) ([place])    Cirrhosis (HCC)    COPD (chronic obstructive pulmonary disease) ([place])    Diabetes mellitus without complication ([place])    Hyperlipidemia    Hypertension    Macrocytic anemia 09/04/2018   Myocardial infarction [facility])    several   Pacemaker    CRT-P with RV lead and His Bundle lead ( high  threshold)    Peripheral neuropathy    Pneumonia    PONV (postoperative nausea and vomiting)    unsure of exactly what happened at [facility] in 2010 (knee surgery) that led to crital care   Thyroid disease          Past Surgical History:  Procedure Laterality Date   ABDOMINAL AORTOGRAM W/LOWER EXTREMITY N/A 04/29/2016   Procedure: Abdominal Aortogram w/Lower Extremity;  Surgeon: Waynetta Sandy, MD;  Location: [place] [facility];  Service: Cardiovascular;  Laterality: N/A;   ABDOMINAL AORTOGRAM W/LOWER EXTREMITY N/A 05/06/2017   Procedure: ABDOMINAL AORTOGRAM W/LOWER EXTREMITY;  Surgeon: Angelia Mould, MD;  Location: [place] [facility];  Service: Cardiovascular;  Laterality: N/A;  bilateral   ABDOMINAL HYSTERECTOMY     AMPUTATION Right 07/30/2016   Procedure: AMPUTATION RIGHT SECOND TOE;  Surgeon: Angelia Mould, MD;  Location: [facility] OR;  Service: Vascular;  Laterality: Right;   CARDIAC CATHETERIZATION     2005 at [place] Right 04/29/2016   Procedure: Peripheral Vascular Atherectomy-Right Popliteal;  Surgeon: Waynetta Sandy, MD;  Location: [place] [facility];  Service: Cardiovascular;  Laterality: Right;   PERIPHERAL VASCULAR INTERVENTION Right 04/29/2016   Procedure: Peripheral Vascular Intervention-Right Popliteal;  Surgeon: Waynetta Sandy, MD;  Location: [place] [facility];  Service: Cardiovascular;  Laterality: Right;  POPLITEAL PTA   RADIOFREQUENCY ABLATION     TOTAL KNEE ARTHROPLASTY     TUBAL LIGATION           Family History  Problem Relation Age of Onset   Diabetes Mother    Hyperlipidemia Mother    Diabetes Father    Hyperlipidemia Father    Heart disease Father        before age 91   Heart attack Father    Diabetes Brother    Hyperlipidemia Brother    Heart attack Brother     Social History        Tobacco Use     Smoking status: Former Smoker    Quit date: 03/01/1998    Years since quitting: 20.6   Smokeless tobacco: Never Used  Substance Use Topics   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   Drug use: Yes    Types: Marijuana    Comment: remote use          Current Outpatient Medications  Medication Sig Dispense Refill   albuterol (PROVENTIL HFA;VENTOLIN HFA) 108 (90 Base) MCG/ACT inhaler Inhale 2 puffs into the lungs every 6 (six) hours as needed for wheezing or shortness of breath. 1 Inhaler 2   allopurinol (ZYLOPRIM) 100 MG tablet TAKE ONE TABLET BY MOUTH TWICE DAILY 60 tablet 3   AMBULATORY NON FORMULARY MEDICATION Motorized scooter.  Diagnosis: Osteoarthritis M19.90 1  each 0   Continuous Blood Gluc Sensor (FREESTYLE LIBRE 14 DAY SENSOR) MISC      DULoxetine (CYMBALTA) 30 MG capsule TAKE ONE CAPSULE BY MOUTH EVERY NIGHT AT BEDTIME 90 capsule 1   fluticasone (FLONASE) 50 MCG/ACT nasal spray Place 2 sprays into both nostrils at bedtime. (Patient taking differently: Place 2 sprays into both nostrils as needed. )     fluticasone (FLOVENT HFA) 110 MCG/ACT inhaler Inhale 2 puffs into the lungs 2 (two) times daily as needed (bronchitis).     furosemide (LASIX) 20 MG tablet Take 1 tablet (20 mg total) by mouth 2 (two) times daily. 180 tablet 3   Levothyroxine Sodium 150 MCG CAPS Take 1 capsule (150 mcg total) by mouth daily before breakfast. 30 capsule 3   metoprolol succinate (TOPROL-XL) 25 MG 24 hr tablet TAKE ONE (1) TABLET BY MOUTH EACH DAY 90 tablet 1   NOVOLOG 100 UNIT/ML injection 100 units per day in VGO unknown bolus     Prenatal Vit-Fe Fumarate-FA (MULTIVITAMIN-PRENATAL) 27-0.8 MG TABS tablet Take 1 tablet by mouth daily. Takes for nail and hair growth      psyllium (METAMUCIL SMOOTH TEXTURE) 58.6 % powder Take 1 packet by mouth daily. 283 g 12   No current  facility-administered medications for this visit.          Allergies  Allergen Reactions   Indomethacin Other (See Comments)     Renal Insufficiency   Pregabalin Other (See Comments)    DIZZINESS    Sulfa Antibiotics Hives   Morphine And Related Nausea And Vomiting    Review of Systems:  Constitutional: Denies fever, chills, diaphoresis, appetite change and has fatigue.  HEENT: Denies photophobia, eye pain, redness, hearing loss, ear pain, congestion, sore throat, rhinorrhea, sneezing, mouth sores, neck pain, neck stiffness and tinnitus.   Respiratory: Has SOB, DOE, cough, No chest tightness/wheezing.   Cardiovascular: Denies chest pain, palpitations and leg swelling.  Genitourinary: Denies dysuria, urgency, frequency, hematuria, flank pain and difficulty urinating.  Musculoskeletal: Has myalgias, back pain, joint swelling, arthralgias and gait problem.  Skin: No rash.  Neurological: Denies dizziness, seizures, syncope, weakness, light-headedness, numbness and headaches.  Hematological: Denies adenopathy. Has Easy bruising. Psychiatric/Behavioral: Has anxiety or depression     Physical Exam:    BP 120/76    Pulse 72    Temp 98.2 F (36.8 C)    Ht 5' 2"  (1.575 m)    Wt 233 lb 6 oz (105.9 kg)    BMI 42.68 kg/m     Filed Weights   10/17/18 0931  Weight: 233 lb 6 oz (105.9 kg)   Constitutional:  Well-developed, in no acute distress. Psychiatric: Normal mood and affect. Behavior is normal. HEENT: Pupils normal.  Conjunctivae are normal. No scleral icterus. Neck supple.  Cardiovascular: Normal rate, regular rhythm. No edema Pulmonary/chest: Effort normal and breath sounds-decreased. no wheezing, rales or rhonchi. Abdominal: Soft, nondistended. Nontender. Bowel sounds active throughout. There are no masses palpable. No hepatomegaly. Rectal:  defered Neurological: Alert and oriented to person place and time. Skin: Skin is warm and dry. No rashes  noted.  Data Reviewed: I have personally reviewed following labs and imaging studies  CBC: CBC Latest Ref Rng & Units 10/10/2018 09/26/2018 09/22/2018  WBC 4.0 - 10.5 K/uL 3.8(L) 3.6 3.1(L)  Hemoglobin 12.0 - 15.0 g/dL 10.5(L) 9.5(L) 9.4(L)  Hematocrit 36.0 - 46.0 % 35.4(L) 29.7(L) 32.3(L)  Platelets 150 - 400 K/uL 66(L) 75(LL) 76(L)    CMP: CMP Latest Ref  Rng & Units 10/10/2018 09/26/2018 09/22/2018  Glucose 70 - 99 mg/dL 182(H) 217(H) 249(H)  BUN 8 - 23 mg/dL 21 22 23   Creatinine 0.44 - 1.00 mg/dL 0.76 0.87 1.14(H)  Sodium 135 - 145 mmol/L 138 142 136  Potassium 3.5 - 5.1 mmol/L 4.0 4.1 3.4(L)  Chloride 98 - 111 mmol/L 98 97 95(L)  CO2 22 - 32 mmol/L 33(H) 28 28  Calcium 8.9 - 10.3 mg/dL 9.7 9.5 9.2  Total Protein 6.5 - 8.1 g/dL 7.3 - -  Total Bilirubin 0.3 - 1.2 mg/dL 0.8 - -  Alkaline Phos 38 - 126 U/L 145(H) - -  AST 15 - 41 U/L 40 - -  ALT 0 - 44 U/L 22 - -    GFR: Estimated Creatinine Clearance: 75.9 mL/min (by C-G formula based on SCr of 0.76 mg/dL). Liver Function Tests: Last Labs      Recent Labs  Lab 10/10/18 1403  AST 40  ALT 22  ALKPHOS 145*  BILITOT 0.8  PROT 7.3  ALBUMIN 3.7        Radiology Studies:  Imaging Results  Ct Abdomen Pelvis W Contrast  Result Date: 09/28/2018 CLINICAL DATA:  Melena, chronic anemia, rule out GI bleeding, rule out liver disease EXAM: CT ABDOMEN AND PELVIS WITH CONTRAST TECHNIQUE: Multidetector CT imaging of the abdomen and pelvis was performed using the standard protocol following bolus administration of intravenous contrast. CONTRAST:  115m OMNIPAQUE IOHEXOL 300 MG/ML SOLN, additional oral enteric contrast COMPARISON:  None. FINDINGS: Lower chest: Cardiomegaly. Coronary artery calcifications and/or stents. Partially imaged pacer leads. Hepatobiliary: Coarse, nodular contour of liver. Gallstone near the gallbladder neck. Gallbladder wall thickening and trace pericholecystic fluid. No biliary ductal dilatation.  Pancreas: Unremarkable. No pancreatic ductal dilatation or surrounding inflammatory changes. Spleen: Splenomegaly, maximum span 15.6 cm. Heterogeneous perfusion. Adrenals/Urinary Tract: Adrenal glands are unremarkable. Kidneys are normal, without renal calculi, solid lesion, or hydronephrosis. Bladder is unremarkable. Stomach/Bowel: Stomach is within normal limits. Appendix appears normal. No evidence of bowel wall thickening, distention, or inflammatory changes. Sigmoid diverticulosis. Vascular/Lymphatic: Aortic atherosclerosis. No enlarged abdominal or pelvic lymph nodes. Reproductive: Status post hysterectomy Other: Anasarca.  Trace ascites. Musculoskeletal: No acute or significant osseous findings. IMPRESSION: 1. Sigmoid diverticulosis. There are no findings of the abdomen or pelvis to specifically localize source of GI bleeding. Please note that in generally administration of oral enteric contrast significantly limits evaluation for GI bleeding. 2.  Stigmata of cirrhosis and splenomegaly.  Trace ascites. 3. Gallstone near the gallbladder neck. Gallbladder wall thickening and trace pericholecystic fluid. No biliary ductal dilatation. The presence of gallbladder wall thickening is nonspecific in the setting of cirrhosis and ascites. HIDA may be used to further evaluate for patency of the cystic duct acute cholecystitis is suspected. Electronically Signed   By: AEddie CandleM.D.   On: 09/28/2018 08:41   Dg Chest Portable 1 View  Result Date: 09/22/2018 CLINICAL DATA:  Shortness of breath. EXAM: PORTABLE CHEST 1 VIEW COMPARISON:  09/09/2018 FINDINGS: Cardiomegaly and LEFT-sided pacemaker again noted. Pulmonary vascular congestion is present. There is no evidence of focal airspace disease, pulmonary edema, suspicious pulmonary nodule/mass, pleural effusion, or pneumothorax. No acute bony abnormalities are identified. IMPRESSION: Cardiomegaly with pulmonary vascular congestion. Electronically Signed   By:  JMargarette CanadaM.D.   On: 09/22/2018 12:45   Vas UKoreaABurnard BuntingWith/wo Tbi  Result Date: 10/04/2018 LOWER EXTREMITY DOPPLER STUDY Indications: Peripheral vascular disease. High Risk Factors: Hypertension, hyperlipidemia, Diabetes, past history of  smoking, prior MI.  Vascular Interventions: 04/29/2016: Jetstream atherectomy of right SFA and                         popliteal arteries; Drug coated balloon angioplasty of                         Right sfa and popliteal arteries with 53m impact. Performing Technologist: PBurley SaverRVT  Examination Guidelines: A complete evaluation includes at minimum, Doppler waveform signals and systolic blood pressure reading at the level of bilateral brachial, anterior tibial, and posterior tibial arteries, when vessel segments are accessible. Bilateral testing is considered an integral part of a complete examination. Photoelectric Plethysmograph (PPG) waveforms and toe systolic pressure readings are included as required and additional duplex testing as needed. Limited examinations for reoccurring indications may be performed as noted.  ABI Findings: +---------+------------------+-----+-------------------+--------+  Right     Rt Pressure (mmHg) Index Waveform            Comment   +---------+------------------+-----+-------------------+--------+  Brachial  139                                                    +---------+------------------+-----+-------------------+--------+  PTA       57                 0.41  dampened monophasic           +---------+------------------+-----+-------------------+--------+  DP        19                 0.14  dampened monophasic           +---------+------------------+-----+-------------------+--------+  Great Toe 0                  0.00  Abnormal            Absent    +---------+------------------+-----+-------------------+--------+ +---------+----------------+-----+----------+---------------------------------+  Left      Lt Pressure       Index Waveform   Comment                                       (mmHg)                                                               +---------+----------------+-----+----------+---------------------------------+  Brachial  138                                                                  +---------+----------------+-----+----------+---------------------------------+  PTA       92               0.66  monophasic                                    +---------+----------------+-----+----------+---------------------------------+  DP        62               0.45  monophasic                                    +---------+----------------+-----+----------+---------------------------------+  Great Toe 0                0.00  Abnormal   unable to obtain due to low                                                     amplitude                          +---------+----------------+-----+----------+---------------------------------+ +-------+-----------+-----------+------------+------------+  ABI/TBI Today's ABI Today's TBI Previous ABI Previous TBI  +-------+-----------+-----------+------------+------------+  Right   0.41        0           0.87         0.00          +-------+-----------+-----------+------------+------------+  Left    0.66        0           0.54         0.00          +-------+-----------+-----------+------------+------------+ Right ABIs appear decreased compared to prior study on 04/20/2017. Left ABIs appear essentially unchanged.  Summary: Right: Resting right ankle-brachial index indicates severe right lower extremity arterial disease. The right toe-brachial index is abnormal. RT great toe pressure = 0 mmHg. Left: Resting left ankle-brachial index indicates moderate left lower extremity arterial disease. The left toe-brachial index is abnormal. LT Great toe pressure = 0 mmHg. PPG tracings appear dampened.  *See table(s) above for measurements and observations.  Electronically signed by [name] MD  on 10/04/2018 at 1:52:38 PM.    Final        Carmell Austria, MD 10/17/2018, 9:52 AM  Cc: Mackie Pai, PA-C

## 2018-12-26 ENCOUNTER — Encounter (HOSPITAL_COMMUNITY): Payer: Self-pay | Admitting: Gastroenterology

## 2018-12-27 ENCOUNTER — Telehealth: Payer: Self-pay | Admitting: Gastroenterology

## 2018-12-27 NOTE — Telephone Encounter (Signed)
DOD-Dr. Lyndel Safe patient=Called and spoke with patient- patient reports = -she is unable to swallow anything solid since Monday after EGD/colonoscopy; -has only been able to eat jello, Popsicles, soup, broth; -cannot take Tylenol or Ibuprofen due to liver cirrhosis (stage 4?) -has a hx of diverticulosis? -could this be the cause? -has been taking medication (Aciphex) -pain eases off after taking the Aciphex, med wears off, and then the Aciphex no longer works -pain is located at the top middle of her stomach and radiates towards her back (pain kept her up all night long as she was unable to get comfortable)  Please advise

## 2018-12-27 NOTE — Telephone Encounter (Signed)
Called and spoke with patient-patient informed of MD recommendations and reports she is feeling better since eating a bowl of warm cream of chicken and noodle soup mixed together; patient reports she will continue to monitor symptoms and update MD as needed; Patient advised to call back to the office at [phone] should questions/concerns arise;  Patient verbalized understanding of information/instructions;

## 2018-12-27 NOTE — Telephone Encounter (Signed)
Her swallowing should slowly improve.  Is most likely from the ligating bands that were placed on her esophageal varices.  Pain is not common afterwards but it certainly can happen as well.  Please offer her viscous lidocaine, she can swallow this twice daily as needed for pain.  It is actually okay that she takes Tylenol for this pain but never more than 2 g/day which is one half of the usual recommended dose on the directions.

## 2018-12-28 NOTE — Anesthesia Postprocedure Evaluation (Signed)
Anesthesia Post Note  Patient: KMARI BRIAN  Procedure(s) Performed: ESOPHAGOGASTRODUODENOSCOPY (EGD) WITH PROPOFOL (N/A ) COLONOSCOPY WITH PROPOFOL (N/A ) ESOPHAGEAL BANDING     Patient location during evaluation: Endoscopy Anesthesia Type: MAC Level of consciousness: awake Pain management: pain level controlled Vital Signs Assessment: post-procedure vital signs reviewed and stable Respiratory status: spontaneous breathing Cardiovascular status: stable Postop Assessment: no apparent nausea or vomiting Anesthetic complications: no    Last Vitals:  Vitals:   12/25/18 1010 12/25/18 1020  BP: (!) 152/66 125/73  Pulse: 92 90  Resp: 18 17  Temp:    SpO2: 100% 100%    Last Pain:  Vitals:   12/26/18 1247  TempSrc:   PainSc: 0-No pain   Pain Goal:                   Huston Foley

## 2019-01-01 DIAGNOSIS — E1142 Type 2 diabetes mellitus with diabetic polyneuropathy: Secondary | ICD-10-CM | POA: Diagnosis not present

## 2019-01-01 DIAGNOSIS — Z794 Long term (current) use of insulin: Secondary | ICD-10-CM | POA: Diagnosis not present

## 2019-01-01 DIAGNOSIS — I10 Essential (primary) hypertension: Secondary | ICD-10-CM | POA: Diagnosis not present

## 2019-01-02 ENCOUNTER — Telehealth: Payer: Self-pay

## 2019-01-02 ENCOUNTER — Encounter: Payer: Self-pay | Admitting: Medical

## 2019-01-02 NOTE — Telephone Encounter (Signed)
Left message for patient to call back to the office-patient needs a f/u OV to be scheduled in the next 2 weeks for f/u post EGD/colonoscopy-melena/IDA secondary to chronic blood loss/pt will need repeat EGD in 4-6 wks from 12/25/2018 at [facility] (band ligation may be needed)/[place]

## 2019-01-03 ENCOUNTER — Encounter: Payer: Self-pay | Admitting: Medical

## 2019-01-03 NOTE — Telephone Encounter (Signed)
Please see additional documentation concerning this patient 

## 2019-01-03 NOTE — Telephone Encounter (Signed)
Pt calling back about below message and states she needs to speak with the nurse about scheduling another Endoscopy.

## 2019-01-03 NOTE — Telephone Encounter (Signed)
Called and spoke with patient-patient has been scheduled for a f/u OV on 01/15/2019 at 3:40 pm;  Patient verbalized understanding of information/instructions; Patient advised to call back to the office at [phone] should questions/concerns arise;

## 2019-01-04 ENCOUNTER — Other Ambulatory Visit: Payer: Self-pay

## 2019-01-05 ENCOUNTER — Encounter: Payer: Self-pay | Admitting: Medical

## 2019-01-05 ENCOUNTER — Ambulatory Visit (INDEPENDENT_AMBULATORY_CARE_PROVIDER_SITE_OTHER): Payer: HMO | Admitting: Medical

## 2019-01-05 ENCOUNTER — Ambulatory Visit: Payer: HMO | Admitting: Family

## 2019-01-05 ENCOUNTER — Other Ambulatory Visit: Payer: Self-pay

## 2019-01-05 VITALS — BP 147/91 | HR 78 | Temp 96.9°F | Ht 62.0 in | Wt 222.4 lb

## 2019-01-05 DIAGNOSIS — K7460 Unspecified cirrhosis of liver: Secondary | ICD-10-CM | POA: Diagnosis not present

## 2019-01-05 DIAGNOSIS — T148XXD Other injury of unspecified body region, subsequent encounter: Secondary | ICD-10-CM | POA: Diagnosis not present

## 2019-01-05 DIAGNOSIS — I509 Heart failure, unspecified: Secondary | ICD-10-CM | POA: Diagnosis not present

## 2019-01-05 DIAGNOSIS — I8500 Esophageal varices without bleeding: Secondary | ICD-10-CM | POA: Diagnosis not present

## 2019-01-05 DIAGNOSIS — D649 Anemia, unspecified: Secondary | ICD-10-CM | POA: Diagnosis not present

## 2019-01-05 DIAGNOSIS — L089 Local infection of the skin and subcutaneous tissue, unspecified: Secondary | ICD-10-CM

## 2019-01-05 MED ORDER — MUPIROCIN 2 % EX OINT: TOPICAL_OINTMENT | CUTANEOUS | 0 refills | Status: DC

## 2019-01-05 NOTE — Patient Instructions (Addendum)
For chf, follow up with cardiologist this afternoon.  For anemia iron deficieny type, I do recommend you back on iron.  For esophageal varices do recommend you do procedure with Dr. Lyndel Safe.  It sounds like you may have some gastroparesis since hx of diabetes. Ask Dr. Lyndel Safe opinion on med called reglan.  For small wound infection area chronic for one year. Rx mupirocin antibiotic topically. Get wound culture and will see what bacteria is found. Please get me name of oral antibiotic you took recently that caused rash/hives.  Follow up 3-4 weeks or as needed.

## 2019-01-05 NOTE — Progress Notes (Signed)
Subjective:    Patient ID: Alexis [name], female    DOB: 1950/04/11, 68 y.o.   MRN: [id number]  HPI  Pt in for follow up.  Pt states she feels a lot better after stopping cymbalta.   Pt had egd and she was diagnosed with esophageal varices, portal hypertension and iron deficiency and anemia.   Pt is set up for endoscopic band ligation.  Pt has difficulty swallowing. Pt states gi MD states she should eat pureed diet. Pt has lost weight since not wanting to eat so lost weight. Pt is diabetic and I think likely some gastroparesis.  Pt has cirrhosis of liver.  Pt has appointment with cardiologist today.  Pt want to get shingles vaccine.  Pt has chronic left lower abdomen shallow wound with mild yellow dc for one year. Pt states went to wound care in [place] with cone. Never got competeley better but area was worse per ot.     Review of Systems  Constitutional: Negative for chills, fatigue and fever.  Respiratory: Negative for cough, chest tightness and wheezing.   Cardiovascular: Negative for chest pain and palpitations.  Gastrointestinal: Negative for abdominal pain.  Musculoskeletal: Negative for back pain.  Skin: Negative for rash.  Neurological: Negative for dizziness, weakness and headaches.  Hematological: Negative for adenopathy. Does not bruise/bleed easily.  Psychiatric/Behavioral: Negative for behavioral problems, decreased concentration and suicidal ideas. The patient is not nervous/anxious.     Past Medical History:  Diagnosis Date  . Allergy   . Anxiety   . Arthritis   . Asthma   . Atrial fibrillation ([place])   . CHF (congestive heart failure) ([place])   . Cirrhosis ([place])   . COPD (chronic obstructive pulmonary disease) ([place])   . Diabetes mellitus without complication ([place])   . Hyperlipidemia   . Hypertension   . Macrocytic anemia 09/04/2018  . Myocardial infarction ([place])    several  . Pacemaker    CRT-P with RV lead and His Bundle lead ( high  threshold)   . Peripheral neuropathy   . Pneumonia   . PONV (postoperative nausea and vomiting)    unsure of exactly what happened at [facility] in 2010 (knee surgery) that led to crital care  . Thyroid disease      Social History   Socioeconomic History  . Marital status: Married    Spouse name: Not on file  . Number of children: Not on file  . Years of education: Not on file  . Highest education level: Not on file  Occupational History  . Occupation: retired  Scientific laboratory technician  . Financial resource strain: Patient refused  . Food insecurity    Worry: Not on file    Inability: Not on file  . Transportation needs    Medical: Not on file    Non-medical: Not on file  Tobacco Use  . Smoking status: Former Smoker    Quit date: 03/01/1998    Years since quitting: 20.8  . Smokeless tobacco: Never Used  Substance and Sexual Activity  . 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  . Drug use: Yes    Types: Marijuana    Comment: remote use  . Sexual activity: Never  Lifestyle  . Physical activity    Days per week: Not on file    Minutes per session: Not on file  . Stress: Not on file  Relationships  . Social Herbalist on phone: Not on file    Gets together: Not on file    Attends religious service: Not on file    Active member of club or organization: Not on file    Attends meetings of clubs or organizations: Not on file    Relationship status: Not on file  . Intimate partner violence    Fear of current or ex partner: Not on file    Emotionally abused: Not on file    Physically abused: Not on file    Forced sexual activity: Not on file  Other Topics Concern  . Not on file  Social History Narrative  . Not on file    Past Surgical History:  Procedure Laterality Date  . ABDOMINAL AORTOGRAM W/LOWER EXTREMITY N/A 04/29/2016   Procedure: Abdominal Aortogram w/Lower Extremity;   Surgeon: Waynetta Sandy, MD;  Location: [facility];  Service: Cardiovascular;  Laterality: N/A;  . ABDOMINAL AORTOGRAM W/LOWER EXTREMITY N/A 05/06/2017   Procedure: ABDOMINAL AORTOGRAM W/LOWER EXTREMITY;  Surgeon: Angelia Mould, MD;  Location: [place];  Service: Cardiovascular;  Laterality: N/A;  bilateral  . ABDOMINAL HYSTERECTOMY    . AMPUTATION Right 07/30/2016   Procedure: AMPUTATION RIGHT SECOND TOE;  Surgeon: Angelia Mould, MD;  Location: [place];  Service: Vascular;  Laterality: Right;  . CARDIAC CATHETERIZATION     2005 at [place] N/A 12/25/2018   Procedure: COLONOSCOPY WITH PROPOFOL;  Surgeon: [place]quline Denmark, MD;  [facility];  Service: Endoscopy;  Laterality: N/A;  . ESOPHAGEAL BANDING  12/25/2018   Procedure: ESOPHAGEAL BANDING;  Surgeon: [place]quline Denmark, MD;  [facility]: [facility];  Service: Endoscopy;;  . ESOPHAGOGASTRODUODENOSCOPY (EGD) WITH PROPOFOL N/A 12/25/2018   Procedure: ESOPHAGOGASTRODUODENOSCOPY (EGD) WITH PROPOFOL;  Surgeon: [place]quline Denmark, MD;  [facility];  Service: Endoscopy;  Laterality: N/A;  . PERIPHERAL VASCULAR ATHERECTOMY Right 04/29/2016   Procedure: Peripheral Vascular Atherectomy-Right Popliteal;  Surgeon: Waynetta Sandy, MD;  Location: [place] [facility];  Service: Cardiovascular;  Laterality: Right;  . PERIPHERAL VASCULAR INTERVENTION Right 04/29/2016   Procedure: Peripheral Vascular Intervention-Right Popliteal;  Surgeon: Waynetta Sandy, MD;  Location: [place] [facility];  Service: Cardiovascular;  Laterality: Right;  POPLITEAL PTA  . RADIOFREQUENCY ABLATION    . TOTAL KNEE ARTHROPLASTY    . TUBAL LIGATION      Family History  Problem Relation Age of Onset  . Diabetes Mother   . Hyperlipidemia Mother   . Diabetes Father   . Hyperlipidemia Father   . Heart disease Father        before age 75  . Heart attack Father   . Diabetes  Brother   . Hyperlipidemia Brother   . Heart attack Brother     Allergies  Allergen Reactions  . Indomethacin Other (See Comments)     Renal Insufficiency  . Pregabalin Other (See Comments)    DIZZINESS   . Sulfa Antibiotics Hives  . Morphine And Related Nausea And Vomiting    Current Outpatient Medications on File Prior to Visit  Medication Sig Dispense Refill  . albuterol (VENTOLIN HFA) 108 (90 Base) MCG/ACT inhaler Inhale 2 puffs into the lungs every 6 (six) hours as needed for wheezing or shortness of breath. 18 g 1  . allopurinol (ZYLOPRIM) 100 MG tablet TAKE ONE TABLET BY MOUTH TWICE DAILY 60 tablet 3  . AMBULATORY NON FORMULARY MEDICATION Motorized scooter.  Diagnosis: Osteoarthritis M19.90 1 each 0  . B-D ULTRAFINE III SHORT PEN 31G X 8 MM MISC     . Continuous Blood Gluc Sensor (FREESTYLE LIBRE 14 DAY SENSOR) MISC     . Ferrous Sulfate (IRON PO) Take by mouth. Patient unsure of dose    . fluticasone (FLONASE) 50 MCG/ACT nasal spray Place 2 sprays into both nostrils at bedtime.    . fluticasone (FLOVENT HFA) 110 MCG/ACT inhaler Inhale 2 puffs into the lungs 2 (two) times daily as needed (bronchitis).    . furosemide (LASIX) 20 MG tablet Take 2 tables in the morning and 1 tablet at noon 270 tablet 1  . Levothyroxine Sodium 150 MCG CAPS Take 1 capsule (150 mcg total) by mouth daily before breakfast. 30 capsule 3  . losartan (COZAAR) 25 MG tablet Take 1 tablet (25 mg total) by mouth daily. 90 tablet 0  . metoprolol succinate (TOPROL-XL) 25 MG 24 hr tablet TAKE ONE (1) TABLET BY MOUTH EACH DAY 90 tablet 1  . NOVOLOG 100 UNIT/ML injection 100 units per day in VGO unknown bolus    . Prenatal Vit-Fe Fumarate-FA (MULTIVITAMIN-PRENATAL) 27-0.8 MG TABS tablet Take 1 tablet by mouth daily. Takes for nail and hair growth     . psyllium (METAMUCIL SMOOTH TEXTURE) 58.6 % powder Take 1 packet by mouth daily. 283 g 12  . RABEprazole (ACIPHEX) 20 MG tablet Take 1 tablet (20 mg total) by  mouth daily. 30 tablet 11  . spironolactone (ALDACTONE) 25 MG tablet Take 1 tablet (25 mg total) by mouth daily. 30 tablet 3   No current facility-administered medications on file prior to visit.     BP (!) 147/91   Pulse 78   Temp (!) 96.9 F (36.1 C) (Temporal)   Ht 5' 2"  (1.575 m)   Wt 222 lb 6.4 oz (100.9 kg)   BMI 40.68 kg/m       Objective:   Physical Exam   General- No acute distress. Pleasant patient. Neck- Full range of motion, no jvd Lungs- Clear, even and unlabored. Heart- regular rate and rhythm. Neurologic- CNII- XII grossly intact.      Assessment & Plan:  Chf, cirrhosis, anemia, esophageal varices, delayed wound healing, skin infection.  For chf, follow up with cardiologist this afternoon.  For anemia iron deficiency, I do recommend you back on iron.  For esophageal varices do recommend you do procedure with Dr. Lyndel Safe.  It sounds like you may have some gastroparesis since hx of diabetes. Ask Dr. Lyndel Safe opinion on med called reglan.  For small wound infection area chronic for one year. Rx mupirocin antibiotic topically. Get wound culture and will see what bacteria is found. Please get me name of oral antibiotic you took recently that caused rash/hives.  Follow up 3-4 weeks or as needed.  25  Minutes spent with pt. 50% of time spent counseling pt on plan going forward.  Mackie Pai, PA-C  Mackie Pai, PA-C

## 2019-01-05 NOTE — Progress Notes (Deleted)
Office Visit    Patient Name: Alexis [name] Date of Encounter: 01/05/2019  Primary Care Provider:  Mackie Pai, PA-C Primary Cardiologist:  Jenne Campus, MD Electrophysiologist:  Constance Haw, MD   Chief Complaint    Alexis [name] is a 68 y.o. female with a hx of CHF, iron deficiency anemia, esophageal varices, DM2, cirrhosis of liver presents today for follow up after initiation of Losartan.   Past Medical History    Past Medical History:  Diagnosis Date  . Allergy   . Anxiety   . Arthritis   . Asthma   . Atrial fibrillation ([place])   . CHF (congestive heart failure) ([place])   . Cirrhosis ([place])   . COPD (chronic obstructive pulmonary disease) ([place])   . Diabetes mellitus without complication ([place])   . Hyperlipidemia   . Hypertension   . Macrocytic anemia 09/04/2018  . Myocardial infarction ([place])    several  . Pacemaker    CRT-P with RV lead and His Bundle lead ( high threshold)   . Peripheral neuropathy   . Pneumonia   . PONV (postoperative nausea and vomiting)    unsure of exactly what happened at [facility] in 2010 (knee surgery) that led to crital care  . Thyroid disease    Past Surgical History:  Procedure Laterality Date  . ABDOMINAL AORTOGRAM W/LOWER EXTREMITY N/A 04/29/2016   Procedure: Abdominal Aortogram w/Lower Extremity;  Surgeon: Waynetta Sandy, MD;  Location: [facility] [facility];  Service: Cardiovascular;  Laterality: N/A;  . ABDOMINAL AORTOGRAM W/LOWER EXTREMITY N/A 05/06/2017   Procedure: ABDOMINAL AORTOGRAM W/LOWER EXTREMITY;  Surgeon: Angelia Mould, MD;  Location: [place] [facility];  Service: Cardiovascular;  Laterality: N/A;  bilateral  . ABDOMINAL HYSTERECTOMY    . AMPUTATION Right 07/30/2016   Procedure: AMPUTATION RIGHT SECOND TOE;  Surgeon: Angelia Mould, MD;  Location: [place];  Service: Vascular;  Laterality: Right;  . CARDIAC CATHETERIZATION     2005 at [place] N/A 12/25/2018   Procedure: COLONOSCOPY WITH PROPOFOL;  Surgeon: [place]quline Denmark, MD;  [facility];  Service: Endoscopy;  Laterality: N/A;  . ESOPHAGEAL BANDING  12/25/2018   Procedure: ESOPHAGEAL BANDING;  Surgeon: [place]quline Denmark, MD;  [facility];  Service: Endoscopy;;  . ESOPHAGOGASTRODUODENOSCOPY (EGD) WITH PROPOFOL N/A 12/25/2018   Procedure: ESOPHAGOGASTRODUODENOSCOPY (EGD) WITH PROPOFOL;  Surgeon: [place]quline Denmark, MD;  [facility];  Service: Endoscopy;  Laterality: N/A;  . PERIPHERAL VASCULAR ATHERECTOMY Right 04/29/2016   Procedure: Peripheral Vascular Atherectomy-Right Popliteal;  Surgeon: Waynetta Sandy, MD;  Location: [facility] [facility];  Service: Cardiovascular;  Laterality: Right;  . PERIPHERAL VASCULAR INTERVENTION Right 04/29/2016   Procedure: Peripheral Vascular Intervention-Right Popliteal;  Surgeon: Waynetta Sandy, MD;  Location: [facility];  Service: Cardiovascular;  Laterality: Right;  POPLITEAL PTA  . RADIOFREQUENCY ABLATION    . TOTAL KNEE ARTHROPLASTY    . TUBAL LIGATION      Allergies  Allergies  Allergen Reactions  . Indomethacin Other (See Comments)     Renal Insufficiency  . Pregabalin Other (See Comments)    DIZZINESS   . Sulfa Antibiotics Hives  . Morphine And Related Nausea And Vomiting    History of Present Illness    Alexis [name] is a 68 y.o. female with a hx of *** last seen 11/17/18 by Dr. Agustin Cree.   She follows with her PCP, gastroenterology, and endocrinology.  EKGs/Labs/Other Studies Reviewed:   The following studies were reviewed today: ***  EKG:  EKG is *** ordered today.  The ekg ordered today demonstrates ***  Recent Labs: 09/09/2018: B Natriuretic Peptide 133.5 10/27/2018: NT-Pro BNP 621 12/25/2018: ALT 26; BUN 16; Creatinine, Ser 0.78; Hemoglobin 12.7; Platelets 64; Potassium 3.7; Sodium 135  Recent Lipid Panel    Component Value Date/Time   CHOL 93 (L)  05/10/2018 0944   TRIG 147 05/10/2018 0944   HDL 31 (L) 05/10/2018 0944   CHOLHDL 3.0 05/10/2018 0944   CHOLHDL 6 04/10/2015 1040   VLDL 50.8 (H) 04/10/2015 1040   LDLCALC 33 05/10/2018 0944   LDLDIRECT 111.0 04/10/2015 1040    Home Medications   No outpatient medications have been marked as taking for the 01/05/19 encounter (Appointment) with Loel Dubonnet, NP.      Review of Systems    ***   ROS All other systems reviewed and are otherwise negative except as noted above.  Physical Exam    VS:  There were no vitals taken for this visit. , BMI There is no height or weight on file to calculate BMI. GEN: Well nourished, well developed, in no acute distress. HEENT: normal. Neck: Supple, no JVD, carotid bruits, or masses. Cardiac: ***RRR, no murmurs, rubs, or gallops. No clubbing, cyanosis, edema.  ***Radials/DP/PT 2+ and equal bilaterally.  Respiratory:  ***Respirations regular and unlabored, clear to auscultation bilaterally. GI: Soft, nontender, nondistended, BS + x 4. MS: No deformity or atrophy. Skin: Warm and dry, no rash. Neuro:  Strength and sensation are intact. Psych: Normal affect.  Accessory Clinical Findings    ECG personally reviewed by me today - *** - no acute changes.  Assessment & Plan    1. ***  Disposition: Follow up {follow up:15908} with ***   Loel Dubonnet, NP 01/05/2019, 1:29 PM

## 2019-01-08 ENCOUNTER — Ambulatory Visit (INDEPENDENT_AMBULATORY_CARE_PROVIDER_SITE_OTHER): Payer: HMO | Admitting: Cardiology

## 2019-01-08 ENCOUNTER — Other Ambulatory Visit: Payer: Self-pay

## 2019-01-08 ENCOUNTER — Encounter: Payer: Self-pay | Admitting: Medical

## 2019-01-08 ENCOUNTER — Ambulatory Visit (INDEPENDENT_AMBULATORY_CARE_PROVIDER_SITE_OTHER): Payer: HMO | Admitting: Family

## 2019-01-08 ENCOUNTER — Encounter: Payer: Self-pay | Admitting: Family

## 2019-01-08 ENCOUNTER — Encounter: Payer: Self-pay | Admitting: Cardiology

## 2019-01-08 VITALS — BP 120/68 | HR 99 | Resp 18 | Ht 62.0 in | Wt 215.0 lb

## 2019-01-08 VITALS — BP 120/68 | HR 99 | Ht 62.0 in | Wt 215.0 lb

## 2019-01-08 DIAGNOSIS — I4821 Permanent atrial fibrillation: Secondary | ICD-10-CM

## 2019-01-08 DIAGNOSIS — I10 Essential (primary) hypertension: Secondary | ICD-10-CM

## 2019-01-08 DIAGNOSIS — I5042 Chronic combined systolic (congestive) and diastolic (congestive) heart failure: Secondary | ICD-10-CM

## 2019-01-08 LAB — WOUND CULTURE
MICRO NUMBER:: 1073082
SPECIMEN QUALITY:: ADEQUATE

## 2019-01-08 NOTE — Patient Instructions (Signed)
Medication Instructions:  No medication changes today.  *If you need a refill on your cardiac medications before your next appointment, please call your pharmacy*  Lab Work: No lab work today.  If you have labs (blood work) drawn today and your tests are completely normal, you will receive your results only by: [place] [facility] MyChart Message (if you have MyChart) OR . A paper copy in the mail If you have any lab test that is abnormal or we need to change your treatment, we will call you to review the results.  Testing/Procedures: None today.  Follow-Up: At [facility], you and your health needs are our priority.  As part of our continuing mission to provide you with exceptional heart care, we have created designated Provider Care Teams.  These Care Teams include your primary Cardiologist (physician) and Advanced Practice Providers (APPs -  Physician Assistants and Nurse Practitioners) who all work together to provide you with the care you need, when you need it.  Your next appointment:   3 months  The format for your next appointment:   In Person  Provider:   Jenne Campus, MD  Other Instructions  Continue low salt, heart healthy diet.   Recommend regular cardiovascular exercise.    Atrial Fibrillation  Atrial fibrillation is a type of heartbeat that is irregular or fast (rapid). If you have this condition, your heart beats without any order. This makes it hard for your heart to pump blood in a normal way. Having this condition gives you more risk for stroke, heart failure, and other heart problems. Atrial fibrillation may start all of a sudden and then stop on its own, or it may become a long-lasting problem. What are the causes? This condition may be caused by heart conditions, such as:  High blood pressure.  Heart failure.  Heart valve disease.  Heart surgery. Other causes include:  Pneumonia.  Obstructive sleep apnea.  Lung cancer.  Thyroid disease.  Drinking  too much alcohol. Sometimes the cause is not known. What increases the risk? You are more likely to develop this condition if:  You smoke.  You are older.  You have diabetes.  You are overweight.  You have a family history of this condition.  You exercise often and hard. What are the signs or symptoms? Common symptoms of this condition include:  A feeling like your heart is beating very fast.  Chest pain.  Feeling short of breath.  Feeling light-headed or weak.  Getting tired easily. Follow these instructions at home: Medicines  Take over-the-counter and prescription medicines only as told by your doctor.  If your doctor gives you a blood-thinning medicine, take it exactly as told. Taking too much of it can cause bleeding. Taking too little of it does not protect you against clots. Clots can cause a stroke. Lifestyle      Do not use any tobacco products. These include cigarettes, chewing tobacco, and e-cigarettes. If you need help quitting, ask your doctor.  Do not drink alcohol.  Do not drink beverages that have caffeine. These include coffee, soda, and tea.  Follow diet instructions as told by your doctor.  Exercise regularly as told by your doctor. General instructions  If you have a condition that causes breathing to stop for a short period of time (apnea), treat it as told by your doctor.  Keep a healthy weight. Do not use diet pills unless your doctor says they are safe for you. Diet pills may make heart problems worse.  Keep all follow-up visits as told by your doctor. This is important. Contact a doctor if:  You notice a change in the speed, rhythm, or strength of your heartbeat.  You are taking a blood-thinning medicine and you see more bruising.  You get tired more easily when you move or exercise.  You have a sudden change in weight. Get help right away if:   You have pain in your chest or your belly (abdomen).  You have trouble  breathing.  You have blood in your vomit, poop, or pee (urine).  You have any signs of a stroke. "BE FAST" is an easy way to remember the main warning signs: ? B - Balance. Signs are dizziness, sudden trouble walking, or loss of balance. ? E - Eyes. Signs are trouble seeing or a change in how you see. ? F - Face. Signs are sudden weakness or loss of feeling in the face, or the face or eyelid drooping on one side. ? A - Arms. Signs are weakness or loss of feeling in an arm. This happens suddenly and usually on one side of the body. ? S - Speech. Signs are sudden trouble speaking, slurred speech, or trouble understanding what people say. ? T - Time. Time to call emergency services. Write down what time symptoms started.  You have other signs of a stroke, such as: ? A sudden, very bad headache with no known cause. ? Feeling sick to your stomach (nausea). ? Throwing up (vomiting). ? Jerky movements you cannot control (seizure). These symptoms may be an emergency. Do not wait to see if the symptoms will go away. Get medical help right away. Call your local emergency services (911 in the [place] Do not drive yourself to the hospital. Summary  Atrial fibrillation is a type of heartbeat that is irregular or fast (rapid).  You are at higher risk of this condition if you smoke, are older, have diabetes, or are overweight.  Follow your doctor's instructions about medicines, diet, exercise, and follow-up visits.  Get help right away if you think that you have signs of a stroke. This information is not intended to replace advice given to you by your health care provider. Make sure you discuss any questions you have with your health care provider. Document Released: 11/25/2007 Document Revised: 04/21/2017 Document Reviewed: 04/08/2017 Elsevier Patient Education  2020 [employer].

## 2019-01-08 NOTE — Patient Instructions (Signed)
Medication Instructions:  Your physician recommends that you continue on your current medications as directed. Please refer to the Current Medication list given to you today.  *If you need a refill on your cardiac medications before your next appointment, please call your pharmacy*  Labwork: None ordered If you have labs (blood work) drawn today and your tests are completely normal, you will receive your results only by:  [place] (if you have MyChart) OR  A paper copy in the mail If you have any lab test that is abnormal or we need to change your treatment, we will call you to review the results.  Testing/Procedures: None ordered  Follow-Up: Remote monitoring is used to monitor your Pacemaker or ICD from home. This monitoring reduces the number of office visits required to check your device to one time per year. It allows [place] to keep an eye on the functioning of your device to ensure it is working properly. You are scheduled for a device check from home on 04/09/2019. You may send your transmission at any time that day. If you have a wireless device, the transmission will be sent automatically. After your physician reviews your transmission, you will receive a postcard with your next transmission date.  Your physician wants you to follow-up in: 1 year with Dr. Curt Bears. You will receive a reminder letter in the mail two months in advance. If you don't receive a letter, please call our office to schedule the follow-up appointment.  Thank you for choosing [facility]!!   Trinidad Curet, RN [phone]  Any Other Special Instructions Will Be Listed Below (If Applicable).

## 2019-01-08 NOTE — Progress Notes (Signed)
Office Visit    Patient Name: Alexis [name] Date of Encounter: 01/08/2019  Primary Care Provider:  Mackie Pai, PA-C Primary Cardiologist:  Jenne Campus, MD Electrophysiologist:  Will Meredith Leeds, MD   Chief Complaint    Alexis [name] is a 68 y.o. female with a hx of permanent atrial fibrillation, PPM, HTN, congestive heart failure presents today for follow up of CHF   Past Medical History    Past Medical History:  Diagnosis Date  . Allergy   . Anxiety   . Arthritis   . Asthma   . Atrial fibrillation ([place])   . CHF (congestive heart failure) ([place])   . Cirrhosis ([employer])   . COPD (chronic obstructive pulmonary disease) ([place])   . Diabetes mellitus without complication ([place])   . Hyperlipidemia   . Hypertension   . Macrocytic anemia 09/04/2018  . Myocardial infarction ([place])    several  . Pacemaker    CRT-P with RV lead and His Bundle lead ( high threshold)   . Peripheral neuropathy   . Pneumonia   . PONV (postoperative nausea and vomiting)    unsure of exactly what happened at [facility] in 2010 (knee surgery) that led to crital care  . Thyroid disease    Past Surgical History:  Procedure Laterality Date  . ABDOMINAL AORTOGRAM W/LOWER EXTREMITY N/A 04/29/2016   Procedure: Abdominal Aortogram w/Lower Extremity;  Surgeon: Waynetta Sandy, MD;  Location: [place] [facility];  Service: Cardiovascular;  Laterality: N/A;  . ABDOMINAL AORTOGRAM W/LOWER EXTREMITY N/A 05/06/2017   Procedure: ABDOMINAL AORTOGRAM W/LOWER EXTREMITY;  Surgeon: Angelia Mould, MD;  Location: [place] [facility];  Service: Cardiovascular;  Laterality: N/A;  bilateral  . ABDOMINAL HYSTERECTOMY    . AMPUTATION Right 07/30/2016   Procedure: AMPUTATION RIGHT SECOND TOE;  Surgeon: Angelia Mould, MD;  Location: [facility];  Service: Vascular;  Laterality: Right;  . CARDIAC CATHETERIZATION     2005 at [place] N/A 12/25/2018   Procedure: COLONOSCOPY WITH PROPOFOL;  Surgeon: Jackquline Denmark, MD;  [facility];  Service: Endoscopy;  Laterality: N/A;  . ESOPHAGEAL BANDING  12/25/2018   Procedure: ESOPHAGEAL BANDING;  Surgeon: Jackquline Denmark, MD;  [facility];  Service: Endoscopy;;  . ESOPHAGOGASTRODUODENOSCOPY (EGD) WITH PROPOFOL N/A 12/25/2018   Procedure: ESOPHAGOGASTRODUODENOSCOPY (EGD) WITH PROPOFOL;  Surgeon: Jackquline Denmark, MD;  [facility];  Service: Endoscopy;  Laterality: N/A;  . PERIPHERAL VASCULAR ATHERECTOMY Right 04/29/2016   Procedure: Peripheral Vascular Atherectomy-Right Popliteal;  Surgeon: Waynetta Sandy, MD;  Location: [place] [facility];  Service: Cardiovascular;  Laterality: Right;  . PERIPHERAL VASCULAR INTERVENTION Right 04/29/2016   Procedure: Peripheral Vascular Intervention-Right Popliteal;  Surgeon: Waynetta Sandy, MD;  Location: [place] [facility];  Service: Cardiovascular;  Laterality: Right;  POPLITEAL PTA  . RADIOFREQUENCY ABLATION    . TOTAL KNEE ARTHROPLASTY    . TUBAL LIGATION      Allergies  Allergies  Allergen Reactions  . Indomethacin Other (See Comments)     Renal Insufficiency  . Pregabalin Other (See Comments)    DIZZINESS   . Sulfa Antibiotics Hives  . Morphine And Related Nausea And Vomiting    History of Present Illness    Alexis [name] is a 68 y.o. female with a hx of permanent atrial fibrillation, PPM, HTN, CHF, cirrhosis of the liver, PAD, esophageal varices last seen 11/17/18 by Dr. Agustin Cree. Echo 10/02/18 LVEF 40%, RV moderately  reduced function, RA severely dilated, moderate TR.  Of note, she has been off of anticoagulation since 07/2018 due to thrombocytopenia. She was referred to hematology but visit not completed.   Follows with Dr. Lou Cal of [facility]. Seen 10/04/18 for infrainguinal arterial occlusive disease bilaterally. Noted to be poor surgical candidate. She has follow up in 9 months wit hhis office.   Reports no  edema.Reports no DOE nor SOB. Denies chest pain. She is presently very concerned with her GI workup for esophageal varices and cirrhosis. He is following closely with Dr. Lyndel Safe. Anticipates a repeat EGD.  EKGs/Labs/Other Studies Reviewed:   The following studies were reviewed today: Echo 09/2018 IMPRESSIONS  1. The right ventricle has moderately reduced systolic function. The cavity was mildly enlarged. There is no increase in right ventricular wall thickness.  2. Right atrial size was severely dilated.  3. Small pericardial effusion.  4. The pericardial effusion is circumferential.  5. No evidence of mitral valve stenosis.  6. Tricuspid valve regurgitation is moderate.  7. No stenosis of the aortic valve.  8. The aorta is normal in size and structure.  9. The aortic root and ascending aorta are normal in size and structure. 10. The inferior vena cava was dilated in size with <50% respiratory variability.  EKG:  No EKG today.  Recent Labs: 09/09/2018: B Natriuretic Peptide 133.5 10/27/2018: NT-Pro BNP 621 12/25/2018: ALT 26; BUN 16; Creatinine, Ser 0.78; Hemoglobin 12.7; Platelets 64; Potassium 3.7; Sodium 135  Recent Lipid Panel    Component Value Date/Time   CHOL 93 (L) 05/10/2018 0944   TRIG 147 05/10/2018 0944   HDL 31 (L) 05/10/2018 0944   CHOLHDL 3.0 05/10/2018 0944   CHOLHDL 6 04/10/2015 1040   VLDL 50.8 (H) 04/10/2015 1040   LDLCALC 33 05/10/2018 0944   LDLDIRECT 111.0 04/10/2015 1040    Home Medications   Current Meds  Medication Sig  . albuterol (VENTOLIN HFA) 108 (90 Base) MCG/ACT inhaler Inhale 2 puffs into the lungs every 6 (six) hours as needed for wheezing or shortness of breath.  . allopurinol (ZYLOPRIM) 100 MG tablet TAKE ONE TABLET BY MOUTH TWICE DAILY  . AMBULATORY NON FORMULARY MEDICATION Motorized scooter.  Diagnosis: Osteoarthritis M19.90  . B-D ULTRAFINE III SHORT PEN 31G X 8 MM MISC   . Continuous Blood Gluc Sensor (FREESTYLE LIBRE 14 DAY SENSOR) MISC    . Ferrous Sulfate (IRON PO) Take by mouth. Patient unsure of dose  . fluticasone (FLONASE) 50 MCG/ACT nasal spray Place 2 sprays into both nostrils at bedtime.  . fluticasone (FLOVENT HFA) 110 MCG/ACT inhaler Inhale 2 puffs into the lungs 2 (two) times daily as needed (bronchitis).  . furosemide (LASIX) 20 MG tablet Take 2 tables in the morning and 1 tablet at noon  . Levothyroxine Sodium 150 MCG CAPS Take 1 capsule (150 mcg total) by mouth daily before breakfast.  . losartan (COZAAR) 25 MG tablet Take 1 tablet (25 mg total) by mouth daily.  . metoprolol succinate (TOPROL-XL) 25 MG 24 hr tablet TAKE ONE (1) TABLET BY MOUTH EACH DAY  . mupirocin ointment (BACTROBAN) 2 % Apply to area left groin area twice daily.  Marland Kitchen NOVOLOG 100 UNIT/ML injection 100 units per day in VGO unknown bolus  . Prenatal Vit-Fe Fumarate-FA (MULTIVITAMIN-PRENATAL) 27-0.8 MG TABS tablet Take 1 tablet by mouth daily. Takes for nail and hair growth   . psyllium (METAMUCIL SMOOTH TEXTURE) 58.6 % powder Take 1 packet by mouth daily.  . RABEprazole (ACIPHEX) 20 MG  tablet Take 1 tablet (20 mg total) by mouth daily.  Marland Kitchen spironolactone (ALDACTONE) 25 MG tablet Take 1 tablet (25 mg total) by mouth daily.    Review of Systems   Review of Systems  Constitution: Negative for chills, fever and malaise/fatigue.  Cardiovascular: Negative for chest pain, dyspnea on exertion, irregular heartbeat and leg swelling.  Respiratory: Negative for cough, shortness of breath and wheezing.   Neurological: Negative for dizziness, light-headedness and weakness.   All other systems reviewed and are otherwise negative except as noted above.  Physical Exam    VS:  BP 120/68   Pulse 99   Resp 18   Ht 5' 2"  (1.575 m)   Wt 215 lb (97.5 kg)   SpO2 97%   BMI 39.32 kg/m  , BMI Body mass index is 39.32 kg/m. GEN: Well nourished, overweight, well developed, in no acute distress. HEENT: normal. Neck: Supple, no JVD, carotid bruits, or masses.  Cardiac: RRR, no murmurs, rubs, or gallops. No clubbing, cyanosis, edema.  Radials/DP/PT 2+ and equal bilaterally.  Respiratory:  Respirations regular and unlabored, clear to auscultation bilaterally. GI: Soft, nontender, nondistended, BS + x 4. MS: No deformity or atrophy. Skin: Warm and dry, no rash. Neuro:  Strength and sensation are intact. Psych: Normal affect.  Assessment & Plan    1. Persistent longstanding atrial fibrillation - Rate controlled with Metoprolol and PPM. She has a CHADS2VASC=4 but is not anticoagulated secondary to thrombocytopenia and cirrhosis of th eliver. She was educated on signs and symptoms of stroke today.   2. Tachy-brady syndrome /p PPM - Follows with Dr. Curt Bears. Seen today.  3. PAD - Follows with Dr. Scot Dock of [facility].  4. Hypertensive heart disease with chronic systolic and diastolic heart failure - BP well controlled. Euvolemic on exam. GDMT beta blocker, loop diuretic, MRA, ARB. Encourage low sodium diet. Recommend regular cardiovascular exercise.   5. Cirrhois of liver - Following with Dr. Lyndel Safe of GI.  Disposition: Follow up in 3 month(s) with Dr. Agustin Cree.   Loel Dubonnet, NP 01/08/2019, 8:04 PM

## 2019-01-08 NOTE — Progress Notes (Signed)
Electrophysiology Office Note   Date:  01/08/2019   ID:  KALIYA SHREINER, DOB 11/26/1950, MRN [id number]  PCP:  Elise Benne  Primary Electrophysiologist:  Constance Haw, MD    No chief complaint on file.    History of Present Illness: Alexis [name] is a 68 y.o. female who is being seen today for the evaluation of pacemaker at the request of [place], Percell Miller, [place]. Presenting today for electrophysiology evaluation. History of atrial fibrillation, COPD, insulin-dependent diabetes, hypertension, hyperlipidemia, and systolic heart failure per notes, Graves' disease status post radioactive iodine, and a pacemaker. She went in atrial fibrillation with rapid rates during an admission with viral gastroenteritis and transaminitis. She had a Medtronic CRT P implanted 05/23/14 with the RA and RV leads implanted in 2004. She has both RV lead and a His bundle lead. A His bundle lead had a high threshold and thus was turned off at her recent hospitalization, and her RV lead was turned on with a lower rate of 50. She does have persistent long-standing atrial fibrillation and a rate control strategy was determined.  Today, denies symptoms of palpitations, chest pain, shortness of breath, orthopnea, PND, lower extremity edema, claudication, dizziness, presyncope, syncope, bleeding, or neurologic sequela. The patient is tolerating medications without difficulties.  She is currently doing well.  She has lost quite a bit of weight over the last few months.  She has been diagnosed with cirrhosis and is currently undergoing therapy.  She is apparently not a liver transplant candidate.   Past Medical History:  Diagnosis Date  . Allergy   . Anxiety   . Arthritis   . Asthma   . Atrial fibrillation ([place])   . CHF (congestive heart failure) ([place])   . Cirrhosis ([place])   . COPD (chronic obstructive pulmonary disease) ([place])   . Diabetes mellitus without complication ([place])   . Hyperlipidemia   .  Hypertension   . Macrocytic anemia 09/04/2018  . Myocardial infarction ([place])    several  . Pacemaker    CRT-P with RV lead and His Bundle lead ( high threshold)   . Peripheral neuropathy   . Pneumonia   . PONV (postoperative nausea and vomiting)    unsure of exactly what happened at [facility] in 2010 (knee surgery) that led to crital care  . Thyroid disease    Past Surgical History:  Procedure Laterality Date  . ABDOMINAL AORTOGRAM W/LOWER EXTREMITY N/A 04/29/2016   Procedure: Abdominal Aortogram w/Lower Extremity;  Surgeon: Waynetta Sandy, MD;  Location: [place] [facility];  Service: Cardiovascular;  Laterality: N/A;  . ABDOMINAL AORTOGRAM W/LOWER EXTREMITY N/A 05/06/2017   Procedure: ABDOMINAL AORTOGRAM W/LOWER EXTREMITY;  Surgeon: Angelia Mould, MD;  Location: [place] [facility];  Service: Cardiovascular;  Laterality: N/A;  bilateral  . ABDOMINAL HYSTERECTOMY    . AMPUTATION Right 07/30/2016   Procedure: AMPUTATION RIGHT SECOND TOE;  Surgeon: Angelia Mould, MD;  Location: [place];  Service: Vascular;  Laterality: Right;  . CARDIAC CATHETERIZATION     2005 at [place] N/A 12/25/2018   Procedure: COLONOSCOPY WITH PROPOFOL;  Surgeon: Jackquline Denmark, MD;  [facility];  Service: Endoscopy;  Laterality: N/A;  . ESOPHAGEAL BANDING  12/25/2018   Procedure: ESOPHAGEAL BANDING;  Surgeon: Jackquline Denmark, MD;  [facility];  Service: Endoscopy;;  . ESOPHAGOGASTRODUODENOSCOPY (EGD) WITH PROPOFOL N/A 12/25/2018   Procedure: ESOPHAGOGASTRODUODENOSCOPY (EGD) WITH PROPOFOL;  Surgeon: Jackquline Denmark, MD;  [facility];  Service: Endoscopy;  Laterality: N/A;  . PERIPHERAL VASCULAR ATHERECTOMY Right 04/29/2016   Procedure: Peripheral Vascular Atherectomy-Right Popliteal;  Surgeon: Waynetta Sandy, MD;  Location: [place] [facility];  Service: Cardiovascular;  Laterality: Right;  . PERIPHERAL VASCULAR  INTERVENTION Right 04/29/2016   Procedure: Peripheral Vascular Intervention-Right Popliteal;  Surgeon: Waynetta Sandy, MD;  Location: [place] [facility];  Service: Cardiovascular;  Laterality: Right;  POPLITEAL PTA  . RADIOFREQUENCY ABLATION    . TOTAL KNEE ARTHROPLASTY    . TUBAL LIGATION       Current Outpatient Medications  Medication Sig Dispense Refill  . albuterol (VENTOLIN HFA) 108 (90 Base) MCG/ACT inhaler Inhale 2 puffs into the lungs every 6 (six) hours as needed for wheezing or shortness of breath. 18 g 1  . allopurinol (ZYLOPRIM) 100 MG tablet TAKE ONE TABLET BY MOUTH TWICE DAILY 60 tablet 3  . AMBULATORY NON FORMULARY MEDICATION Motorized scooter.  Diagnosis: Osteoarthritis M19.90 1 each 0  . B-D ULTRAFINE III SHORT PEN 31G X 8 MM MISC     . Continuous Blood Gluc Sensor (FREESTYLE LIBRE 14 DAY SENSOR) MISC     . Ferrous Sulfate (IRON PO) Take by mouth. Patient unsure of dose    . fluticasone (FLONASE) 50 MCG/ACT nasal spray Place 2 sprays into both nostrils at bedtime.    . fluticasone (FLOVENT HFA) 110 MCG/ACT inhaler Inhale 2 puffs into the lungs 2 (two) times daily as needed (bronchitis).    . furosemide (LASIX) 20 MG tablet Take 2 tables in the morning and 1 tablet at noon 270 tablet 1  . Levothyroxine Sodium 150 MCG CAPS Take 1 capsule (150 mcg total) by mouth daily before breakfast. 30 capsule 3  . losartan (COZAAR) 25 MG tablet Take 1 tablet (25 mg total) by mouth daily. 90 tablet 0  . metoprolol succinate (TOPROL-XL) 25 MG 24 hr tablet TAKE ONE (1) TABLET BY MOUTH EACH DAY 90 tablet 1  . mupirocin ointment (BACTROBAN) 2 % Apply to area left groin area twice daily. 22 g 0  . NOVOLOG 100 UNIT/ML injection 100 units per day in VGO unknown bolus    . Prenatal Vit-Fe Fumarate-FA (MULTIVITAMIN-PRENATAL) 27-0.8 MG TABS tablet Take 1 tablet by mouth daily. Takes for nail and hair growth     . psyllium (METAMUCIL SMOOTH TEXTURE) 58.6 % powder Take 1 packet by mouth daily.  283 g 12  . RABEprazole (ACIPHEX) 20 MG tablet Take 1 tablet (20 mg total) by mouth daily. 30 tablet 11  . spironolactone (ALDACTONE) 25 MG tablet Take 1 tablet (25 mg total) by mouth daily. 30 tablet 3   No current facility-administered medications for this visit.     Allergies:   Indomethacin, Pregabalin, Sulfa antibiotics, and Morphine and related   Social History:  The patient  reports that she quit smoking about 20 years ago. She has never used smokeless tobacco. She reports current drug use. Drug: Marijuana. She reports that she does not drink alcohol.   Family History:  The patient's family history includes Diabetes in her brother, father, and mother; Heart attack in her brother and father; Heart disease in her father; Hyperlipidemia in her brother, father, and mother.    ROS:  Please see the history of present illness.   Otherwise, review of systems is positive for none.   All other systems are reviewed and negative.   PHYSICAL EXAM: VS:  BP 120/68   Pulse 99   Ht 5'  2" (1.575 m)   Wt 215 lb (97.5 kg)   SpO2 97%   BMI 39.32 kg/m  , BMI Body mass index is 39.32 kg/m. GEN: Well nourished, well developed, in no acute distress  HEENT: normal  Neck: no JVD, carotid bruits, or masses Cardiac: Irregular R; no murmurs, rubs, or gallops,no edema  Respiratory:  clear to auscultation bilaterally, normal work of breathing GI: soft, nontender, nondistended, + BS MS: no deformity or atrophy  Skin: warm and dry, device site well healed Neuro:  Strength and sensation are intact Psych: euthymic mood, full affect  EKG:  EKG is ordered today. Personal review of the ekg ordered shows AF, rate 99  Persoatrial fibrillation, rate 99 review of the device interrogation today. Results in [place]: 09/09/2018: B Natriuretic Peptide 133.5 10/27/2018: NT-Pro BNP 621 12/25/2018: ALT 26; BUN 16; Creatinine, Ser 0.78; Hemoglobin 12.7; Platelets 64; Potassium 3.7; Sodium 135     Lipid Panel     Component Value Date/Time   CHOL 93 (L) 05/10/2018 0944   TRIG 147 05/10/2018 0944   HDL 31 (L) 05/10/2018 0944   CHOLHDL 3.0 05/10/2018 0944   CHOLHDL 6 04/10/2015 1040   VLDL 50.8 (H) 04/10/2015 1040   LDLCALC 33 05/10/2018 0944   LDLDIRECT 111.0 04/10/2015 1040     Wt Readings from Last 3 Encounters:  01/08/19 215 lb (97.5 kg)  01/05/19 222 lb 6.4 oz (100.9 kg)  12/25/18 217 lb (98.4 kg)      Other studies Reviewed: Additional studies/ records that were reviewed today include: TTE 11/26/15  Review of the above records today demonstrates:  - Left ventricle: The cavity size was normal. Wall thickness was   normal. Systolic function was normal. The estimated ejection   fraction was in the range of 50% to 55%. - Left atrium: The atrium was mildly dilated. - Right ventricle: The cavity size was mildly dilated. - Atrial septum: No defect or patent foramen ovale was identified. - Impressions: Overall poor image quality   ASSESSMENT AND PLAN:  1.  Tachy-Brady syndrome: Status post Medtronic pacemaker.  She has a hiss lead, and RV lead, and RA lead.  She is set to VVIR off of the RV lead.  No changes.  2. Persistent long standing atrial fibrillation: Currently on Eliquis.    This patients CHA2DS2-VASc Score and unadjusted Ischemic Stroke Rate (% per year) is equal to 4.8 % stroke rate/year from a score of 4  Above score calculated as 1 point each if present [CHF, HTN, DM, Vascular=MI/PAD/Aortic Plaque, Age if 65-74, or Female] Above score calculated as 2 points each if present [Age > 75, or Stroke/TIA/TE]    Current medicines are reviewed at length with the patient today.   The patient does not have concerns regarding her medicines.  The following changes were made today: None  Labs/ tests ordered today include:  Orders Placed This Encounter  Procedures  . EKG 12-Lead     Disposition:   FU with Tarvaris Puglia 12 months  Signed, Syris Brookens Meredith Leeds,  MD  01/08/2019 3:39 PM     [facility] [address] [place] [address] [phone] (office) [phone] (fax)

## 2019-01-09 ENCOUNTER — Encounter: Payer: Self-pay | Admitting: Medical

## 2019-01-09 ENCOUNTER — Telehealth: Payer: Self-pay | Admitting: Medical

## 2019-01-09 MED ORDER — CIPROFLOXACIN HCL 500 MG PO TABS: 500.0000 mg | ORAL_TABLET | Freq: Two times a day (BID) | ORAL | 0 refills | Status: DC

## 2019-01-09 NOTE — Telephone Encounter (Signed)
cipro rx sent to pt pharmacy.

## 2019-01-10 ENCOUNTER — Encounter: Payer: Self-pay | Admitting: Medical

## 2019-01-12 ENCOUNTER — Encounter: Payer: Self-pay | Admitting: Medical

## 2019-01-15 ENCOUNTER — Ambulatory Visit (INDEPENDENT_AMBULATORY_CARE_PROVIDER_SITE_OTHER): Payer: HMO | Admitting: Gastroenterology

## 2019-01-15 ENCOUNTER — Other Ambulatory Visit: Payer: Self-pay

## 2019-01-15 ENCOUNTER — Encounter: Payer: Self-pay | Admitting: Gastroenterology

## 2019-01-15 VITALS — BP 122/70 | HR 69 | Temp 97.3°F | Ht 62.0 in | Wt 218.0 lb

## 2019-01-15 DIAGNOSIS — K7460 Unspecified cirrhosis of liver: Secondary | ICD-10-CM | POA: Diagnosis not present

## 2019-01-15 MED ORDER — SPIRONOLACTONE 50 MG PO TABS: 50.0000 mg | ORAL_TABLET | Freq: Every day | ORAL | 11 refills | Status: DC

## 2019-01-15 MED ORDER — PROAIR HFA 108 (90 BASE) MCG/ACT IN AERS: 2.0000 | INHALATION_SPRAY | Freq: Four times a day (QID) | RESPIRATORY_TRACT | 1 refills | Status: DC | PRN

## 2019-01-15 MED ORDER — RABEPRAZOLE SODIUM 20 MG PO TBEC: 20.0000 mg | DELAYED_RELEASE_TABLET | Freq: Two times a day (BID) | ORAL | 11 refills | Status: DC

## 2019-01-15 NOTE — Progress Notes (Signed)
Chief Complaint: FU  Referring Provider:  Mackie Pai, PA-C      ASSESSMENT AND PLAN;   #1.  Liver cirrhosis d/t  R sided CHF, NASH. R/O other etiologies (no ETOH x 20 yrs). Dx on CT 08/2018, with portal hypertension in form of splenomegaly with hypersplenism, esophageal varices, pancytopenia, mild ascites. No definite clinical HE.  MELD Na 13 (11/2018)  #2. Esophageal varices (Gd III) s/p EVL 11/2018.   #3. GERD with small transient HH.  #4. Comorbid conditions include CHF, PVD, HTN, A. fib, HLD, PAD, DM 2, CAD, pacemaker and obesity.   Plan: - Increase Aciphex 32m po bid 60, 11 refiils. - [facility] - Check CBC, CMP, PT INR. - Continue lasix 646mpo qd - Increase aldactone 5054mo qd. #30, 11 refills. - Salt restricted diet. - Check autoimmune hepatitis panel (AMA, ASMA and anti-LKM), ANA, iron studies, serum ceruloplasmin, A1AT, celiac screen and GGT. - Check ammonia, alpha-fetoprotein and PT INR. - Check anti-HAV total Ab and HBsAb.  If negative, would recommend vaccination for hepatitis A and B. - Appt with [facility] (any afternoon appt)    HPI:    [name] a 67 21o. female  With newly diagnosed liver cirrhosis on CT 08/2018  For FU.  Feels great since she has stopped Cymbalta.  Underwent EGD and colonoscopy December 25, 2018.  EGD showed grade 3 esophageal varices s/p EVL, portal hypertensive gastropathy.  Colonoscopy 11/2018 showed mild portal hypertensive colopathy, grade 4 internal hemorrhoids, mild pancolonic diverticulosis.  She had some problems with epigastric pain and dysphagia after endoscopic variceal ligation.  She took Jell-O for 2 to 3 days thereafter.  Currently better except for occasional epigastric discomfort, regurgitation and heartburn despite AcipHex once a day.  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  After that  she did not drink any alcohol.  She is alcohol free for last 20 years.  To me she denies having any history of hematochezia.  Has more constipation but is better on Metamucil.  No change in mental status.  Has history of easy bruisability.  No history of itching, skin lesions, easy bruisability, intake of over-the-counter medications including diet pills, herbal medications, anabolic steroids or Tylenol.  There is no history of blood transfusions, IV drug use or family history of liver disease.  No jaundice dark urine or pale stools.  Recently seen by cardiology and vascular surgery as well.  Has chronic mild shortness of breath.  No nonsteroidals.  Got wt 222 to 218lb.  Gained 2 pounds since yesterday.   2DE 09/2018: (Dr K) Raliegh Ip. The right ventricle has moderately reduced systolic function. The cavity was mildly enlarged. 2. Right atrial size was severely dilated. 3. Small pericardial effusion. 4. EF 40%  CT AP with contrast 09/28/2018: 1. Sigmoid diverticulosis. 2.  Stigmata of cirrhosis and splenomegaly (15.6 cm).  Trace ascites. 3. Gallstone near the gallbladder neck  Past Medical History:  Diagnosis Date   Allergy    Anxiety    Arthritis    Asthma    Atrial fibrillation (HCC)    CHF (congestive heart failure) (HCC)    Cirrhosis (HCC)    COPD (chronic obstructive [place] disease) ([place]  Diabetes mellitus without complication ([place]  Hyperlipidemia    Hypertension    Macrocytic anemia 09/04/2018   Myocardial  infarction [facility])    several   Pacemaker    CRT-P with RV lead and His Bundle lead ( high threshold)    Peripheral neuropathy    Pneumonia    PONV (postoperative nausea and vomiting)    unsure of exactly what happened at [facility] in 2010 (knee surgery) that led to crital care   Thyroid disease     Past Surgical History:  Procedure Laterality Date   ABDOMINAL AORTOGRAM W/LOWER EXTREMITY N/A 04/29/2016   Procedure: Abdominal  Aortogram w/Lower Extremity;  Surgeon: Waynetta Sandy, MD;  Location: [place] [facility];  Service: Cardiovascular;  Laterality: N/A;   ABDOMINAL AORTOGRAM W/LOWER EXTREMITY N/A 05/06/2017   Procedure: ABDOMINAL AORTOGRAM W/LOWER EXTREMITY;  Surgeon: Angelia Mould, MD;  Location: [place] [facility];  Service: Cardiovascular;  Laterality: N/A;  bilateral   ABDOMINAL HYSTERECTOMY     AMPUTATION Right 07/30/2016   Procedure: AMPUTATION RIGHT SECOND TOE;  Surgeon: Angelia Mould, MD;  Location: [place];  Service: Vascular;  Laterality: Right;   CARDIAC CATHETERIZATION     2005 at [place] N/A 12/25/2018   Procedure: COLONOSCOPY WITH PROPOFOL;  Surgeon: Jackquline Denmark, MD;  Location: [facility];  Service: Endoscopy;  Laterality: N/A;   ESOPHAGEAL BANDING  12/25/2018   Procedure: ESOPHAGEAL BANDING;  Surgeon: Jackquline Denmark, MD;  Location: [facility];  Service: Endoscopy;;   ESOPHAGOGASTRODUODENOSCOPY (EGD) WITH PROPOFOL N/A 12/25/2018   Procedure: ESOPHAGOGASTRODUODENOSCOPY (EGD) WITH PROPOFOL;  Surgeon: Jackquline Denmark, MD;  Location: [facility];  Service: Endoscopy;  Laterality: N/A;   PERIPHERAL VASCULAR ATHERECTOMY Right 04/29/2016   Procedure: Peripheral Vascular Atherectomy-Right Popliteal;  Surgeon: Waynetta Sandy, MD;  Location: [place] [facility];  Service: Cardiovascular;  Laterality: Right;   PERIPHERAL VASCULAR INTERVENTION Right 04/29/2016   Procedure: Peripheral Vascular Intervention-Right Popliteal;  Surgeon: Waynetta Sandy, MD;  Location: [place] [facility];  Service: Cardiovascular;  Laterality: Right;  POPLITEAL PTA   RADIOFREQUENCY ABLATION     TOTAL KNEE ARTHROPLASTY     TUBAL LIGATION      Family History  Problem Relation Age of Onset   Diabetes Mother    Hyperlipidemia Mother    Diabetes Father    Hyperlipidemia Father    Heart disease Father        before age 7   Heart  attack Father    Diabetes Brother    Hyperlipidemia Brother    Heart attack Brother     Social History   Tobacco Use   Smoking status: Former Smoker    Quit date: 03/01/1998    Years since quitting: 20.8   Smokeless tobacco: Never Used  Substance Use Topics   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   Drug use: Yes    Types: Marijuana    Comment: remote use    Current Outpatient Medications  Medication Sig Dispense Refill   allopurinol (ZYLOPRIM) 100 MG tablet TAKE ONE TABLET BY MOUTH TWICE DAILY 60 tablet 3   AMBULATORY NON FORMULARY MEDICATION Motorized scooter.  Diagnosis: Osteoarthritis M19.90 1 each 0   ciprofloxacin (CIPRO) 500 MG tablet Take 1 tablet (500 mg total) by mouth 2 (two) times daily. 14 tablet 0   Continuous Blood Gluc Sensor (FREESTYLE LIBRE 14 DAY SENSOR) MISC      Ferrous Sulfate (IRON PO) Take by mouth. Patient unsure of dose  fluticasone (FLONASE) 50 MCG/ACT nasal spray Place 2 sprays into both nostrils at bedtime.     fluticasone (FLOVENT HFA) 110 MCG/ACT inhaler Inhale 2 puffs into the lungs 2 (two) times daily as needed (bronchitis).     furosemide (LASIX) 20 MG tablet Take 2 tables in the morning and 1 tablet at noon 270 tablet 1   Levothyroxine Sodium 150 MCG CAPS Take 1 capsule (150 mcg total) by mouth daily before breakfast. 30 capsule 3   losartan (COZAAR) 25 MG tablet Take 1 tablet (25 mg total) by mouth daily. 90 tablet 0   metoprolol succinate (TOPROL-XL) 25 MG 24 hr tablet TAKE ONE (1) TABLET BY MOUTH EACH DAY 90 tablet 1   mupirocin ointment (BACTROBAN) 2 % Apply to area left groin area twice daily. 22 g 0   Prenatal Vit-Fe Fumarate-FA (MULTIVITAMIN-PRENATAL) 27-0.8 MG TABS tablet Take 1 tablet by mouth daily. Takes for nail and hair growth      PROAIR HFA 108 (90 Base) MCG/ACT inhaler Inhale 2 puffs into the lungs every 6 (six) hours as needed  for wheezing or shortness of breath. 18 g 1   psyllium (METAMUCIL SMOOTH TEXTURE) 58.6 % powder Take 1 packet by mouth daily. 283 g 12   RABEprazole (ACIPHEX) 20 MG tablet Take 1 tablet (20 mg total) by mouth daily. 30 tablet 11   spironolactone (ALDACTONE) 25 MG tablet Take 1 tablet (25 mg total) by mouth daily. 30 tablet 3   NOVOLOG 100 UNIT/ML injection 100 units per day in VGO unknown bolus     No current facility-administered medications for this visit.     Allergies  Allergen Reactions   Indomethacin Other (See Comments)     Renal Insufficiency   Pregabalin Other (See Comments)    DIZZINESS    Sulfa Antibiotics Hives   Morphine And Related Nausea And Vomiting    Review of Systems:  Constitutional: Denies fever, chills, diaphoresis, appetite change and has fatigue.  HEENT: Denies photophobia, eye pain, redness, hearing loss, ear pain, congestion, sore throat, rhinorrhea, sneezing, mouth sores, neck pain, neck stiffness and tinnitus.   Respiratory: Has SOB, DOE, cough, No chest tightness/wheezing.   Cardiovascular: Denies chest pain, palpitations and leg swelling.  Genitourinary: Denies dysuria, urgency, frequency, hematuria, flank pain and difficulty urinating.  Musculoskeletal: Has myalgias, back pain, joint swelling, arthralgias and gait problem.  Skin: No rash.  Neurological: Denies dizziness, seizures, syncope, weakness, light-headedness, numbness and headaches.  Hematological: Denies adenopathy. Has Easy bruising. Psychiatric/Behavioral: Has anxiety or depression     Physical Exam:    BP 122/70    Pulse 69    Temp (!) 97.3 F (36.3 C)    Ht 5' 2"  (1.575 m)    Wt 218 lb (98.9 kg) Comment: verbalized   BMI 39.87 kg/m  Filed Weights   01/15/19 1529  Weight: 218 lb (98.9 kg)   Constitutional:  Well-developed, in no acute distress. Psychiatric: Normal mood and affect. Behavior is normal. HEENT: Pupils normal.  Conjunctivae are normal. No scleral  icterus. Neck supple.  Cardiovascular: Normal rate, regular rhythm. No edema Pulmonary/chest: Effort normal and breath sounds-decreased. no wheezing, rales or rhonchi. Abdominal: Soft, nondistended. Nontender. Bowel sounds active throughout. There are no masses palpable. No hepatomegaly. Rectal:  defered Neurological: Alert and oriented to person place and time. Skin: Skin is warm and dry. No rashes noted.  Data Reviewed: I have personally reviewed following labs and imaging studies  CBC: CBC Latest Ref Rng & Units 12/25/2018 10/10/2018  09/26/2018  WBC 4.0 - 10.5 K/uL 4.8 3.8(L) 3.6  Hemoglobin 12.0 - 15.0 g/dL 12.7 10.5(L) 9.5(L)  Hematocrit 36.0 - 46.0 % 40.9 35.4(L) 29.7(L)  Platelets 150 - 400 K/uL 64(L) 66(L) 75(LL)    CMP: CMP Latest Ref Rng & Units 12/25/2018 11/17/2018 11/08/2018  Glucose 70 - 99 mg/dL 162(H) 174(H) 223(H)  BUN 8 - 23 mg/dL 16 24 19   Creatinine 0.44 - 1.00 mg/dL 0.78 1.04(H) 0.98  Sodium 135 - 145 mmol/L 135 135 139  Potassium 3.5 - 5.1 mmol/L 3.7 4.1 3.8  Chloride 98 - 111 mmol/L 99 95(L) 96  CO2 22 - 32 mmol/L 26 26 28   Calcium 8.9 - 10.3 mg/dL 9.3 9.8 9.6  Total Protein 6.5 - 8.1 g/dL 8.0 - -  Total Bilirubin 0.3 - 1.2 mg/dL 1.3(H) - -  Alkaline Phos 38 - 126 U/L 156(H) - -  AST 15 - 41 U/L 57(H) - -  ALT 0 - 44 U/L 26 - -    GFR: CrCl cannot be calculated (Patient's most recent lab result is older than the maximum 21 days allowed.). Liver Function Tests: No results for input(s): AST, ALT, ALKPHOS, BILITOT, PROT, ALBUMIN in the last 168 hours. 25 minutes spent with the patient today. Greater than 50% was spent in counseling and coordination of care with the patient    Radiology Studies: No results found.    Carmell Austria, MD 01/15/2019, 3:36 PM  Cc: Mackie Pai, PA-C

## 2019-01-15 NOTE — Patient Instructions (Signed)
If you are age 70 or older, your body mass index should be between 23-30. Your Body mass index is 39.87 kg/m. If this is out of the aforementioned range listed, please consider follow up with your Primary Care Provider.  If you are age 21 or younger, your body mass index should be between 19-25. Your Body mass index is 39.87 kg/m. If this is out of the aformentioned range listed, please consider follow up with your Primary Care Provider.   We have sent the following medications to your pharmacy for you to pick up at your convenience: Aciphex Aldactone  Please go to the lab at [facility] ([place] You will need to go to level "B", you do not need an appointment for this. Hours available are 7:30 am - 4:30 pm.   You have been scheduled for an abdominal ultrasound at [facility] (1st floor) on 01/23/19 at 1:30pm. Please arrive 15 minutes prior to your appointment for registration. Make certain not to have anything to eat or drink 6 hours prior to your appointment. Should you need to reschedule your appointment, please contact radiology at [phone]. This test typically takes about 30 minutes to perform.  It has been recommended to you by your physician that you have a(n) EGD completed. W did not schedule the procedure(s) today. We will contact you when a date and time for you to have this procedure is available.   You have been referred to [facility], you will be contacted by their office with this appointment.   Thank you,  Dr. Jackquline Denmark

## 2019-01-16 NOTE — Addendum Note (Signed)
Addended by: Karena Addison on: 01/16/2019 08:10 AM   Modules accepted: Orders

## 2019-01-17 ENCOUNTER — Other Ambulatory Visit (INDEPENDENT_AMBULATORY_CARE_PROVIDER_SITE_OTHER): Payer: HMO

## 2019-01-17 ENCOUNTER — Ambulatory Visit: Payer: HMO | Admitting: Cardiology

## 2019-01-17 DIAGNOSIS — K7460 Unspecified cirrhosis of liver: Secondary | ICD-10-CM

## 2019-01-17 LAB — CBC WITH DIFFERENTIAL/PLATELET
Basophils Absolute: 0 10*3/uL (ref 0.0–0.1)
Basophils Relative: 0.5 % (ref 0.0–3.0)
Eosinophils Absolute: 0 10*3/uL (ref 0.0–0.7)
Eosinophils Relative: 1.6 % (ref 0.0–5.0)
HCT: 36.7 % (ref 36.0–46.0)
Hemoglobin: 12.3 g/dL (ref 12.0–15.0)
Lymphocytes Relative: 20.2 % (ref 12.0–46.0)
Lymphs Abs: 0.5 10*3/uL — ABNORMAL LOW (ref 0.7–4.0)
MCHC: 33.4 g/dL (ref 30.0–36.0)
MCV: 98.4 fl (ref 78.0–100.0)
Monocytes Absolute: 0.3 10*3/uL (ref 0.1–1.0)
Monocytes Relative: 10.7 % (ref 3.0–12.0)
Neutro Abs: 1.8 10*3/uL (ref 1.4–7.7)
Neutrophils Relative %: 67 % (ref 43.0–77.0)
Platelets: 65 10*3/uL — ABNORMAL LOW (ref 150.0–400.0)
RBC: 3.74 Mil/uL — ABNORMAL LOW (ref 3.87–5.11)
RDW: 17.3 % — ABNORMAL HIGH (ref 11.5–15.5)
WBC: 2.7 10*3/uL — ABNORMAL LOW (ref 4.0–10.5)

## 2019-01-17 LAB — PROTIME-INR
INR: 1.2 ratio — ABNORMAL HIGH (ref 0.8–1.0)
Prothrombin Time: 14.5 s — ABNORMAL HIGH (ref 9.6–13.1)

## 2019-01-17 LAB — COMPREHENSIVE METABOLIC PANEL
ALT: 36 U/L — ABNORMAL HIGH (ref 0–35)
AST: 62 U/L — ABNORMAL HIGH (ref 0–37)
Albumin: 3.8 g/dL (ref 3.5–5.2)
Alkaline Phosphatase: 238 U/L — ABNORMAL HIGH (ref 39–117)
BUN: 19 mg/dL (ref 6–23)
CO2: 30 mEq/L (ref 19–32)
Calcium: 9.5 mg/dL (ref 8.4–10.5)
Chloride: 97 mEq/L (ref 96–112)
Creatinine, Ser: 0.82 mg/dL (ref 0.40–1.20)
GFR: 68.93 mL/min (ref >60.00)
Glucose, Bld: 184 mg/dL — ABNORMAL HIGH (ref 70–99)
Potassium: 3.4 mEq/L — ABNORMAL LOW (ref 3.5–5.1)
Sodium: 136 mEq/L (ref 135–145)
Total Bilirubin: 1.3 mg/dL — ABNORMAL HIGH (ref 0.2–1.2)
Total Protein: 7.7 g/dL (ref 6.0–8.3)

## 2019-01-17 LAB — GAMMA GT: GGT: 177 U/L — ABNORMAL HIGH (ref 7–51)

## 2019-01-17 LAB — AMMONIA: Ammonia: 44 umol/L — ABNORMAL HIGH (ref 11–35)

## 2019-01-19 LAB — HEPATITIS B SURFACE ANTIBODY,QUALITATIVE: Hep B S Ab: NONREACTIVE

## 2019-01-19 LAB — CELIAC PANEL 10
Antigliadin Abs, IgA: 12 units (ref 0–19)
Endomysial IgA: NEGATIVE
Gliadin IgG: 3 units (ref 0–19)
IgA/Immunoglobulin A, Serum: 737 mg/dL — ABNORMAL HIGH (ref 87–352)
Tissue Transglut Ab: <2 U/mL (ref 0–5)
Transglutaminase IgA: <2 U/mL (ref 0–3)

## 2019-01-19 LAB — MITOCHONDRIAL ANTIBODIES: Mitochondrial M2 Ab, IgG: <=20 U

## 2019-01-19 LAB — IRON, TOTAL/TOTAL IRON BINDING CAP
%SAT: 21 % (calc) (ref 16–45)
Iron: 74 ug/dL (ref 45–160)
TIBC: 352 mcg/dL (calc) (ref 250–450)

## 2019-01-19 LAB — AFP TUMOR MARKER: AFP-Tumor Marker: 3.7 ng/mL

## 2019-01-19 LAB — HEPATITIS A ANTIBODY, TOTAL: Hepatitis A AB,Total: REACTIVE — AB

## 2019-01-19 LAB — ALPHA-1-ANTITRYPSIN: A-1 Antitrypsin, Ser: 152 mg/dL (ref 83–199)

## 2019-01-19 LAB — CERULOPLASMIN: Ceruloplasmin: 47 mg/dL (ref 18–53)

## 2019-01-19 LAB — ANA: Anti Nuclear Antibody (ANA): NEGATIVE

## 2019-01-19 LAB — ANTI-SMOOTH MUSCLE ANTIBODY, IGG: Actin (Smooth Muscle) Antibody (IGG): <20 U (ref <20)

## 2019-01-22 ENCOUNTER — Ambulatory Visit: Payer: Self-pay | Admitting: *Deleted

## 2019-01-22 ENCOUNTER — Other Ambulatory Visit: Payer: Self-pay | Admitting: *Deleted

## 2019-01-22 NOTE — Patient Outreach (Signed)
  [facility]) Care Management Chronic Special Needs Program    01/22/2019  Name: LOLETTA HARPER, DOB: September 07, 1950  MRN: [id number]   Ms. Infiniti Hoefling is enrolled in a chronic special needs plan for Diabetes and Heart Failure. Attempted to reach Mrs. Aguirre via mobile/home number to complete initial assessment; no answer; left HIPAA compliant message requesting return call.  Plan: If client does not return call, will make second outreach call within one week.  Kelli Churn RN, CCM, [place] [facility] [phone]

## 2019-01-23 ENCOUNTER — Other Ambulatory Visit: Payer: Self-pay

## 2019-01-23 ENCOUNTER — Ambulatory Visit (HOSPITAL_BASED_OUTPATIENT_CLINIC_OR_DEPARTMENT_OTHER)
Admission: RE | Admit: 2019-01-23 | Discharge: 2019-01-23 | Disposition: A | Discharge status: Home / Self Care | Payer: HMO | Source: Ambulatory Visit | Attending: Gastroenterology | Admitting: Gastroenterology

## 2019-01-23 ENCOUNTER — Other Ambulatory Visit: Payer: Self-pay | Admitting: *Deleted

## 2019-01-23 ENCOUNTER — Ambulatory Visit: Payer: Self-pay | Admitting: *Deleted

## 2019-01-23 DIAGNOSIS — K7460 Unspecified cirrhosis of liver: Secondary | ICD-10-CM | POA: Diagnosis not present

## 2019-01-23 NOTE — Patient Outreach (Signed)
  [facility]) Care Management Chronic Special Needs Program    01/23/2019  Name: AALEYAH WITHEROW, DOB: 1950-05-11  MRN: [id number]   Ms. Lenora Gomes is enrolled in a chronic special needs plan for Diabetes and Heart Failure. Attempted to reach Mrs. Evon via mobile/home number to complete initial assessment; no answer; left HIPAA compliant message requesting return call.  Plan: If client does not return call, will makeanotheroutreach call within one week.  Kelli Churn RN, CCM, [place] [facility] [phone]

## 2019-01-24 ENCOUNTER — Other Ambulatory Visit: Payer: Self-pay | Admitting: *Deleted

## 2019-01-24 NOTE — Patient Outreach (Signed)
  [place] [name]) Care Management Chronic Special Needs Program    01/24/2019  Name: Alexis [name], DOB: Jul 15, 1950  MRN: [id number]   Ms. Alexis [name] is enrolled in a chronic special needs plan for Diabetes. Reached client via mobile number, explained purpose of call. Client requests return call next week; scheduled intake call for 01/31/19 at 10:00 am.  Plan: Will contact client next week to completed initial telephone assessment.  Alexis Churn RN, CCM, [place] [facility] [phone]

## 2019-01-30 ENCOUNTER — Telehealth: Payer: Self-pay | Admitting: Medical

## 2019-01-30 DIAGNOSIS — E876 Hypokalemia: Secondary | ICD-10-CM

## 2019-01-30 DIAGNOSIS — R748 Abnormal levels of other serum enzymes: Secondary | ICD-10-CM

## 2019-01-30 NOTE — Telephone Encounter (Signed)
Future bmp placed.

## 2019-01-31 ENCOUNTER — Other Ambulatory Visit: Payer: Self-pay

## 2019-01-31 ENCOUNTER — Ambulatory Visit: Payer: Self-pay | Admitting: *Deleted

## 2019-01-31 ENCOUNTER — Ambulatory Visit (INDEPENDENT_AMBULATORY_CARE_PROVIDER_SITE_OTHER): Payer: HMO | Admitting: Gastroenterology

## 2019-01-31 ENCOUNTER — Other Ambulatory Visit: Payer: Self-pay | Admitting: *Deleted

## 2019-01-31 DIAGNOSIS — I739 Peripheral vascular disease, unspecified: Secondary | ICD-10-CM | POA: Diagnosis not present

## 2019-01-31 DIAGNOSIS — E1142 Type 2 diabetes mellitus with diabetic polyneuropathy: Secondary | ICD-10-CM | POA: Diagnosis not present

## 2019-01-31 DIAGNOSIS — Z23 Encounter for immunization: Secondary | ICD-10-CM

## 2019-01-31 DIAGNOSIS — L98491 Non-pressure chronic ulcer of skin of other sites limited to breakdown of skin: Secondary | ICD-10-CM | POA: Diagnosis not present

## 2019-01-31 DIAGNOSIS — Z794 Long term (current) use of insulin: Secondary | ICD-10-CM | POA: Diagnosis not present

## 2019-01-31 NOTE — Patient Outreach (Signed)
  [facility]) Care Management Chronic Special Needs Program    01/31/2019  Name: Alexis [name], DOB: 1950/06/23  MRN: [id number]   Ms. Alexis [name] is enrolled in a chronic special needs plan for Diabetes and Heart Failure. Reached client via mobile number to complete pre scheduled telephone assessment, she states she is in transit, has appointment with podiatrist due to "split in my heel and an old ulcer". Mrs. Hayner requests telephone assessment be rescheduled to tomorrow.  Plan: Telephone assessment scheduled for 9:00 am on 02/01/19 per client request.  Kelli Churn RN, CCM, CDE Chronic Care Management Coordinator [place] [facility] [phone]

## 2019-02-01 ENCOUNTER — Encounter: Payer: Self-pay | Admitting: *Deleted

## 2019-02-01 ENCOUNTER — Other Ambulatory Visit: Payer: Self-pay | Admitting: *Deleted

## 2019-02-01 ENCOUNTER — Other Ambulatory Visit: Payer: Self-pay

## 2019-02-01 ENCOUNTER — Other Ambulatory Visit: Payer: Self-pay | Admitting: Cardiology

## 2019-02-01 DIAGNOSIS — R252 Cramp and spasm: Secondary | ICD-10-CM

## 2019-02-01 DIAGNOSIS — J209 Acute bronchitis, unspecified: Secondary | ICD-10-CM

## 2019-02-01 DIAGNOSIS — J440 Chronic obstructive pulmonary disease with acute lower respiratory infection: Secondary | ICD-10-CM

## 2019-02-01 DIAGNOSIS — R1013 Epigastric pain: Secondary | ICD-10-CM

## 2019-02-01 DIAGNOSIS — J4520 Mild intermittent asthma, uncomplicated: Secondary | ICD-10-CM

## 2019-02-01 MED ORDER — POTASSIUM CHLORIDE ER 20 MEQ PO TBCR: 20.0000 meq | EXTENDED_RELEASE_TABLET | Freq: Every day | ORAL | 2 refills | Status: DC

## 2019-02-01 NOTE — Addendum Note (Signed)
Addended by: Mohammed Kindle on: 02/01/2019 10:39 AM   Modules accepted: Orders

## 2019-02-01 NOTE — Patient Outreach (Addendum)
Alexis [name] [facility]) Care Management Chronic Special Needs Program  02/01/2019  Name: Alexis [name] DOB: 1950/03/15  MRN: [id number]  Alexis [name] is enrolled in a chronic special needs plan for Diabetes and Heart Failure. Chronic Care Management Coordinator telephoned client to review health risk assessment and to develop individualized care plan.  Introduced the chronic care management program, importance of client participation, and taking their care plan to all provider appointments and inpatient facilities.  Reviewed the transition of care process and possible referral to community care management.  Subjective: Alexis [name] says from a heart failure perspective she is feeling much better. She reports she recently saw her cardiologist and he told her she can return in one year as she is doing well. She states her blood pressure is in good control and she wears a smart watch which can measure her blood pressure and pulse.   She confirms that she weighs daily and records her weight and correctly states weight gain that would require provider notification.  She says she is currently most concerned about the recent diagnosis of stage 4 liver cirrhosis and a referral to an oncologist as she assumed she had some form of cancer. She was relieved to find out the referral was made to the hematologist/oncologist due to her anemia, leukopenia and thrombocytopenia secondary to her liver disease.  She states she is no longer taking DM medications (insulin) because she has lost a significant amount of fluid weight and was having low blood sugars. She says she monitors her blood sugars via a [employer] which she loves. She says she scans the sensor 4-5 times daily and as needed. She reports her hypoglycemia threshold as <140.  She says she cannot afford her inhalers for her asthma and is currently not using them ; she agrees to a pharmacy consult for copay assistance and would like a  medication box mailed to her home. She says she is seeing a podiatrist regularly for a right heel fissure and ulcer on her big toe. She says she is currently wearing an opened toe walking shoe with U shaped foam inserts and an ankle donut to promote wound healing. She says she struggles with frequent rashes in her skin folds and treats the areas with Vaseline and Gold Bond Medicated Powder. She says she would like to complete advance directives including a DNR form and would welcome assistance from a [facility] social worker. She also says she would like food resources and would consider mobile meals if they can provide low salt, low CHO meals.  She says she is originally from [place], currently lives with her [place] in [place] ina small one story home. She says the wheelchair ramp needs replacement and would appreciate help in getting a new one built. 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 She says she enjoys making jewelry, playing brain games on her tablet as she says she was having trouble with her short term memory when she was on an antidepressant,  and spending time with her daughter and 42 year old granddaughter who love 5 minutes from her.  Goals Addressed            This Visit's Progress   .  Acknowledge receipt of Advanced Directive package       Per client request advance directive packet, MOST form, DNR form and Emmi education on advance directive  mailed to home address on 02/01/19    . Advanced Care Planning complete by 02/29/20       Per client request ,advance directive packet, MOST form, DNR form and Emmi education on Advance Directives mailed to home address on 02/01/19 Referral made to [place] social worker to assist client with completing advance directives    . Client understands the importance of follow-up with providers by attending scheduled visits        Client is adherent with completing provider appointments    . Client verbalize knowledge of Heart Failure disease self management skills by 02/16/19   On track    Client is able to correctly state heart failure action plan; no recent hospitalizations or emergency room visits related to heart failure Mailed Emmi education "Heart Failure and Atrial Fibrillation" which includes the heart failure action plan and signs and symptoms of stroke, to client's home address    . Client verbalizes knowledge of Heart Attack self management skills by 04/30/19       Client is consistent with keeping cardiology appointments  Emmi education "Lowering Your Risk of Heart Disease" which includes signs and symptoms  of a heart attack, mailed to client's home address     . Client will have food insecurities addressed by 04/30/19       Referral placed to [place] social worker on 02/01/19 for food resources and mobile meals    . Client will increase activity tolerance within 3 months       Mailed Emmi education  "Seated Upper and Lower Extremity Exercises "to client's home address Stressed importance of incorporating rest periods with periods of activity    . Client will report ability to obtain Medications within 3 months       Referral made to [place] pharmacist on 02/01/19 for assistance with obtaining asthma inhalers    . Client will report improved coping by 04/30/19       Client had indicated she felt overwhelmed by her cancer diagnosis when she completed her [employer] health risk assessment. Educated client on reasons for referral to hematologist/oncologist on 10/10/18 (i.e. macrocytic anemia, thrombocytopenia and leukopenia) and advised her she does not have a cancer diagnosis    . Client will report no worsening of symptoms of Atrial Fibrillation within the next 3 months       Client related history of atrial fibrillation and tachy brady syndrome with pacemaker placement in 2016. Client states she wears a  smart watch that provides blood pressure and heart rate information and her cardiologist is aware she uses the device to self monitor so she can report concerns to him  Mailed Emmi education :"Living with Atrial Fibrillation " to client's home address    . Client will report no worsening of symptoms related to heart disease within the next 3 months       Client reports history of heart attack Emmi education "Lowering Your Risk of Heart Disease" mailed to client's home address    . Client will use Assistive Devices as needed and verbalize understanding of device use   On track    Client states she uses cane when ambulating in her house and electric scooter when outside her home ( also has a walker that she uses prn)  Mailed Check For Safety Brochure to client's home address    . Client will verbalize knowledge of chronic lung disease as evidenced by no ED visits or Inpatient stays related to chronic lung  disease    On track    Referral made to [place] pharmacist for assistance with inhaler copay    . Client will verbalize knowledge of self management of Hypertension as evidences by BP reading of 140/90 or less; or as defined by provider   On track    BP reading at most recent provider appointment on 01/15/19 was 122/70  Per KPN. Knowledge Performance Now-a point of care tool-  BP readings have met target for both systolic and diastolic since 3/87/56    . Client will verbalize understanding of treatment plan for impaired skin integrity and follow up with provider by 04/30/18       Discussed skin impairment treatment plan and assessed client's adherence with podiatry appointments    . Client/Caregiver will verbalize understanding of instructions related to self-care and safety   On track    Client states she is consistent in using assistive devices and denies falls "Check For Safety" brochure and Emmi education "How to Help Your Memory" mailed to client's home address    . HEMOGLOBIN A1C < 7.0        Reviewed diabetes self management actions:  Glucose monitoring per provider recommendations  Perform Quality checks on blood meter  Eat Healthy  Check feet daily  Visit provider every 3-6 months as directed  Hbg A1C level every 3-6 months.  Eye Exam yearly  Reviewed most recent Hgb A1C result of 8.2% on 01/01/19, increased from Hg A1C= 7.8% on 08/31/18, was 8.0% on 03/31/18    . Maintain timely refills of diabetic medication as prescribed within the year .       Client states she is currently on no medications for diabetes    . COMPLETED: Obtain annual  Lipid Profile, LDL-C   On track    Lipid profile was completed on 05/10/18 with all elements meeting target except HDL was low at 31    . Obtain Annual Eye (retinal)  Exam    Not on track    Client states she is overdue for eye exam and needs an ophthalmologist in network so provided her with the [place] phone number and encouraged her to call ASAP so she can schedule diabetic eye exam Also emailed [place] with request to reach out to client to assist her with in network ophthalmologist and answer any questions she has about health plan    . Obtain Annual Foot Exam   On track    Completed podiatry visit on 01/31/19 due to right toe ulcer and heel fissure    . Obtain annual screen for micro albuminuria (urine) , nephropathy (kidney problems)   Not on track    Unable to find results of recent urine for microalbumin in electronic medical record, KPN (Knowledge Performance Now- point of care tool) or Care Everywhere    . COMPLETED: Obtain Hemoglobin A1C at least 2 times per year   On track    Hgb A1C completed on 01/01/19 with result of 8.2% and on 03/31/18 with result of 7.8%    . COMPLETED: Visit Primary Care Provider or Endocrinologist at least 2 times per year    On track    Client is consistent with completing appointments with endocrinology provider. Most recent appointment  was completed 01/01/19, completed 3 other endocrinology appointments in 2020    . Weight < 200 lb (90.719 kg)       Client states she weighs daily and records the weight,  she reports current weight is 207 lbs      Assessment: Client is not meeting diabetes self-management goal of hemoglobin A1C of <7% with most recent reading of 8.2% on 01/01/19 with occasional reports of hypoglycemia . Client has good understanding of:  COVID-19 cause, symptoms, precautions (social distancing, stay at home order, hand washing, wear face covering when unable to maintain or ensure 6 foot social distancing), and symptoms requiring provider notification. She states she received her flu vaccine last month and her hepatitis B vaccine on 01/31/19.  Plan:   Send successful outreach letter with a copy of their updated individualized care plan,  Send individual care plan to provider Send educational material on advance directives, how to improve your memory, chair exercises, heart disease, heart failure and atrial fibrillation, home safety and a medication box. Chronic care management coordination will outreach in:  3 Months Will refer client to:  Social Work for assistance with completing advance directives/DNR/MOST forms, w/c ramp, food resources and mobile meals. Will refer client to Pharmacy for inhaler copay assistance and medication box.  Kelli Churn RN, CCM, [facility] Care Management [phone]

## 2019-02-01 NOTE — Patient Outreach (Signed)
  [place]) Care Management Chronic Special Needs Program    02/01/2019  Name: Alexis [name], DOB: [birth year]  MRN: [id number]   [name] First is enrolled in a chronic special needs plan for Diabetes and Heart Failure. Telephone assessment was scheduled today per client request; called client's mobile number, no answer, left HIPAA compliant message requesting return call.  Kelli Churn RN, CCM, [place] [facility] Care Management [phone]

## 2019-02-02 ENCOUNTER — Other Ambulatory Visit: Payer: Self-pay | Admitting: *Deleted

## 2019-02-02 NOTE — Patient Outreach (Signed)
  [facility]) Care Management Chronic Special Needs Program    02/02/2019  Name: [name], DOB: 1951/02/20  MRN: [id number]   Ms. Alexis [name] is enrolled in a chronic special needs plan for Diabetes and Heart Failure. Received reply email from [facility], [phone], that she called client today per this RNCM's suggestion, and left message offering to assist her with finding an in network opthalmologist and to answer her questions about the health plan changes for 2021.  Plan: Sent reply email to Hinton Dyer thanking her for reaching out to Mrs. Alexis [name].  Kelli Churn RN, CCM, [place] [facility] Care Management [phone]

## 2019-02-05 ENCOUNTER — Encounter: Payer: Self-pay | Admitting: Medical

## 2019-02-06 ENCOUNTER — Telehealth: Payer: Self-pay | Admitting: Medical

## 2019-02-07 ENCOUNTER — Telehealth: Payer: Self-pay | Admitting: Medical

## 2019-02-07 DIAGNOSIS — R252 Cramp and spasm: Secondary | ICD-10-CM

## 2019-02-07 NOTE — Telephone Encounter (Signed)
Future labs placed. Will help pt get scheduled for Thursday or Friday.

## 2019-02-07 NOTE — Telephone Encounter (Signed)
Opened to review 

## 2019-02-08 ENCOUNTER — Other Ambulatory Visit: Payer: Self-pay

## 2019-02-08 NOTE — Patient Outreach (Signed)
[facility]) Care Management  02/08/2019  Alexis [name] Jul 09, 1950 [id number]    Social work referral received from [facility], Kelli Churn. "Please assist with food resources, mobile meals, however client follows low salt, CHO controlled diet  Please assist with completion of advance directives and MOST form  Please assist with installing new w/c ramp to home- client uses electric scooter when outside the home" Successful outreach to patient today.   Food resources/Mobile Meals: talked with patient about criteria for program and informed her that wait list is typically 6-8 months long.  Patient declined referral being submitted as she is able to prepare her own meals and has additional support in the home.  Advance Directives:  Patient received packet that was mailed.  Patient stated that she has an attorney friend who intends to assist her with completion of documentation.  Patient was encouraged to call this writer if additional assistance is needed.   Ramp:  Patient has ramp at home in need or repair.  Informed her of [place] and three way call was conducted to submit referral. Patient will be mailed documentation that needs to be signed by property owner and returned to [employer].   Will follow up with patient within the next two weeks to ensure receipt of paperwork.    [name], BSW Social Worker [phone]

## 2019-02-09 ENCOUNTER — Other Ambulatory Visit (INDEPENDENT_AMBULATORY_CARE_PROVIDER_SITE_OTHER): Payer: HMO

## 2019-02-09 ENCOUNTER — Other Ambulatory Visit: Payer: Self-pay

## 2019-02-09 DIAGNOSIS — R252 Cramp and spasm: Secondary | ICD-10-CM

## 2019-02-09 LAB — COMPREHENSIVE METABOLIC PANEL
ALT: 37 U/L — ABNORMAL HIGH (ref 0–35)
AST: 55 U/L — ABNORMAL HIGH (ref 0–37)
Albumin: 4.2 g/dL (ref 3.5–5.2)
Alkaline Phosphatase: 234 U/L — ABNORMAL HIGH (ref 39–117)
BUN: 54 mg/dL — ABNORMAL HIGH (ref 6–23)
CO2: 28 mEq/L (ref 19–32)
Calcium: 10.5 mg/dL (ref 8.4–10.5)
Chloride: 93 mEq/L — ABNORMAL LOW (ref 96–112)
Creatinine, Ser: 1.27 mg/dL — ABNORMAL HIGH (ref 0.40–1.20)
GFR: 41.6 mL/min — ABNORMAL LOW (ref >60.00)
Glucose, Bld: 234 mg/dL — ABNORMAL HIGH (ref 70–99)
Potassium: 4.8 mEq/L (ref 3.5–5.1)
Sodium: 131 mEq/L — ABNORMAL LOW (ref 135–145)
Total Bilirubin: 1.5 mg/dL — ABNORMAL HIGH (ref 0.2–1.2)
Total Protein: 8.4 g/dL — ABNORMAL HIGH (ref 6.0–8.3)

## 2019-02-09 LAB — MAGNESIUM: Magnesium: 1.7 mg/dL (ref 1.5–2.5)

## 2019-02-09 NOTE — Telephone Encounter (Signed)
Patient had appointment for today.

## 2019-02-12 ENCOUNTER — Telehealth: Payer: Self-pay | Admitting: Medical

## 2019-02-12 MED ORDER — CYCLOBENZAPRINE HCL 10 MG PO TABS: ORAL_TABLET | ORAL | 0 refills | Status: DC

## 2019-02-12 NOTE — Telephone Encounter (Signed)
Opened to review 

## 2019-02-14 ENCOUNTER — Telehealth: Payer: Self-pay | Admitting: Gastroenterology

## 2019-02-14 NOTE — Telephone Encounter (Signed)
Called and spoke with patient-patient read letter and has seen that the letter is from the liver care clinic; patient reports that she will call them and inquire about needs;  Patient advised to call back to the office at [phone] should questions/concerns arise;  Patient verbalized understanding of information/instructions;

## 2019-02-14 NOTE — Telephone Encounter (Signed)
Left message for patient to call back to the office;  

## 2019-02-19 ENCOUNTER — Encounter: Payer: Self-pay | Admitting: Medical

## 2019-02-19 ENCOUNTER — Other Ambulatory Visit: Payer: Self-pay | Admitting: Pharmacist

## 2019-02-19 ENCOUNTER — Other Ambulatory Visit: Payer: Self-pay

## 2019-02-19 NOTE — Patient Outreach (Signed)
[facility]) Care Management  02/19/2019  LORIS SEELYE 05-Sep-1950 [id number]   Follow up call to patient regarding social work referral for assistance with repair of ramp.  Patient did receive paperwork from [facility] and has completed it and mailed it back. Will follow up with [facility] within the next two months regarding status of referral. Patient inquired about assistance through [place] for tub modification.  Talked with patient about referral/assessment process. Patient consented to referral being submitted.  Will follow up with patient within the next three weeks to ensure that she has been contacted by caseworker.  [name], BSW Social Worker [phone]

## 2019-02-19 NOTE — Patient Outreach (Signed)
[facility] [facility]) Care Management  [place]   02/19/2019  Alexis [name] 08-27-1950 [id number]  Reason for referral: Medication Assistance, Medication Review  Referral source: [employer] C-[employer] Care Manager with [facility] Current insurance: [employer] C-SNP  Outreach:  Successful telephone call with patient.  HIPAA identifiers verified.   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  Does the patient ever forget to take medication?  yes Does the patient have problems obtaining medications due to transportation?   no Does the patient have problems obtaining medications due to cost?  yes  Does the patient feel that medications prescribed are effective?  yes Does the patient ever experience any side effects to the medications prescribed?  yes  Does the patient measure his/her own blood glucose at home?  Yes -184 mg/dl today fasting Does the patient measure his/her own blood pressure at home? No-has lost the charger to her monitor   Objective: The ASCVD Risk score Mikey Bussing DC Jr., et al., 2013) failed to calculate for the following reasons:   The patient has a prior MI or stroke diagnosis  Lab Results  Component Value Date   CREATININE 1.27 (H) 02/09/2019   CREATININE 0.82 01/17/2019   CREATININE 0.78 12/25/2018    Lab Results  Component Value Date   HGBA1C 9.0 04/03/2015    Lipid Panel     Component Value Date/Time   CHOL 93 (L) 05/10/2018 0944   TRIG 147 05/10/2018 0944   HDL 31 (L) 05/10/2018 0944   CHOLHDL 3.0 05/10/2018 0944   CHOLHDL 6 04/10/2015 1040   VLDL 50.8 (H) 04/10/2015 1040   LDLCALC 33 05/10/2018 0944   LDLDIRECT 111.0 04/10/2015 1040    BP Readings  from Last 3 Encounters:  01/15/19 122/70  01/08/19 120/68  01/08/19 120/68    Allergies  Allergen Reactions  . Indomethacin Other (See Comments)     Renal Insufficiency  . Pregabalin Other (See Comments)    DIZZINESS   . Sulfa Antibiotics Hives  . Morphine And Related Nausea And Vomiting    Medications Reviewed Today    Reviewed by Elayne Guerin, [facility] (Pharmacist) on 02/19/19 at [place] List Status: <None>  Medication Order Taking? Sig Documenting Provider Last Dose Status Informant  allopurinol (ZYLOPRIM) 100 MG tablet [id number] Yes TAKE ONE TABLET BY MOUTH TWICE DAILY Saguier, Iris Pert Taking Active   AMBULATORY NON [facility] MEDICATION [id number] Yes Motorized scooter.  Diagnosis: Osteoarthritis M19.90 Ann Held, DO Taking Active Self  azelastine (ASTELIN) 0.1 % nasal spray [id number] Yes Place 2 sprays into both nostrils 2 (two) times daily. Use in each nostril as directed [provider] Taking Active Self       Patient not taking:      Discontinued 02/19/19 0855 (Completed Course)   Continuous Blood Gluc Sensor (FREESTYLE LIBRE [place]) [place] [id number] Yes  [provider] Taking Active   cyclobenzaprine (FLEXERIL) 10 MG tablet [id number] Yes 1 tab po q hs muscle cramps or spasms Elise Benne Taking Active         Discontinued 85/46/27 0350 (Duplicate)   ferrous sulfate 325 (65 FE) MG tablet [id number] Yes Take 325 mg by mouth daily with breakfast. [provider] Taking Active  fluticasone (FLONASE) 50 MCG/ACT nasal spray [id number] Yes Place 2 sprays into both nostrils at bedtime. Alvia Grove, PA-C Taking Active Self  fluticasone (FLOVENT HFA) 110 MCG/ACT inhaler [id number] No Inhale 2 puffs into the lungs 2 (two) times daily as needed (bronchitis).  Patient not taking: Reported on 02/19/2019   Alvia [place], PA-C Not Taking Active Self  furosemide (LASIX) 20 MG tablet [id number] Yes Take 2 tables in the morning  and 1 tablet at noon Park Liter, MD Taking Active   Levothyroxine Sodium 150 MCG CAPS [id number] Yes Take 1 capsule (150 mcg total) by mouth daily before breakfast. Saguier, Percell Miller, PA-C Taking Active   losartan (COZAAR) 25 MG tablet [id number] Yes TAKE ONE (1) TABLET BY MOUTH EVERY DAY Park Liter, MD Taking Active   metoprolol succinate (TOPROL-XL) 25 MG 24 hr tablet [id number] Yes TAKE ONE (1) TABLET BY MOUTH EACH DAY Saguier, Percell Miller, PA-C Taking Active   mupirocin ointment (BACTROBAN) 2 % [id number] Yes Apply to area left groin area twice daily. Saguier, Percell Miller, PA-C Taking Active   NOVOLOG 100 UNIT/ML injection [id number] Yes 20-25 units daily [provider] Taking Active Self  ondansetron (ZOFRAN-ODT) 4 MG disintegrating tablet [id number] Yes Take 4 mg by mouth every 8 (eight) hours as needed. [provider] Taking Active   potassium chloride 20 MEQ TBCR [id number] Yes Take 20 mEq by mouth daily. Jackquline Denmark, MD Taking Active            Med Note Broadus John, [place] Feb 01, 2019 11:38 AM) 14 day course then have K rechecked  Prenatal Vit-Fe Fumarate-FA (MULTIVITAMIN-PRENATAL) 27-0.8 MG TABS tablet [id number] Yes Take 1 tablet by mouth daily. Takes for nail and hair growth  [provider] Taking Active Self  PROAIR HFA 108 [phone] Base) MCG/ACT inhaler [id number] Yes Inhale 2 puffs into the lungs every 6 (six) hours as needed for wheezing or shortness of breath. Saguier, Percell Miller, PA-C Taking Active   psyllium (METAMUCIL SMOOTH TEXTURE) 58.6 % powder [id number] Yes Take 1 packet by mouth daily. Erlene Quan, PA-C Taking Active   RABEprazole (ACIPHEX) 20 MG tablet [id number] Yes Take 1 tablet (20 mg total) by mouth 2 (two) times daily. Jackquline Denmark, MD Taking Active   spironolactone (ALDACTONE) 50 MG tablet [id number] Yes Take 1 tablet (50 mg total) by mouth daily. Jackquline Denmark, MD Taking Active           Assessment: Drugs sorted by  system:  Cardiovascular:  Furosemide, Losartan, Metoprolol XL, Spironolactone  Pulmonary/Allergy: Azelastine, Fluticasone nasal spray, Flovent (not using), ProAir  Gastrointestinal: Ondansetron, Rapeprazole, Psyllium,   Endocrine: Levothyroxine, Novolog  Renal: Allopurinol  Topical: Mupirocin   Pain: Cyclobenzaprine  Vitamins/Minerals/Supplements: Ferrous Sulfate, Potassium Chloride, Prenatal Vitamin     Medication Review Findings:  . K - 4.8 02/09/2019 . HgA1c 7.2% 2019 (needs new lab) . Not on statin due to chronic liver issues   Medication Adherence Findings: Adherence Review  _0  Excellent (no doses missed/week)     _1  Good (no more than 1 dose missed/week)     _2  Partial (2-3 doses missed/week) _3  Poor (>3 doses missed/week)  Patient with excellent understanding of regimen and good understanding of indications.    Medication Assistance Findings:  Medication assistance needs identified: Proventil HFA, Flovent HFA  Extra Help:  Not eligible for Extra Help Low Income Subsidy based on reported income and assets  Patient Assistance Programs: Proventil HFA (substituted for ProAir) made by [place]  requirement met: Yes o Out-of-pocket prescription expenditure met:   Not Applicable - Patient has met application requirements to apply for this program.    Flovent HFA made by [place] o Income requirement met: Yes o Out-of-pocket prescription expenditure met:   No ($600) - Alternative option is to apply for a similar product in the same therapeutic class which does not have a required out of pocket expenditure.  A possible substitution is  Amanex   made by [employer] .    Plan: . I will route patient assistance letter to [place] technician who will coordinate patient assistance program application process for medications listed above.  [facility] pharmacy technician will assist with obtaining all required documents from both patient and provider(s) and submit  application(s) once completed.  . Will route note to Provider [place].  . Will follow-up in 4-6 weeks.   Elayne Guerin, PharmD, [place] Clinical Pharmacist [phone]

## 2019-02-28 ENCOUNTER — Other Ambulatory Visit: Payer: Self-pay | Admitting: Pharmacy Technician

## 2019-02-28 NOTE — Patient Outreach (Signed)
[facility]) Care Management  02/28/2019  Alexis [name] [birth year] [id number]                                        Medication Assistance Referral  Referral From: [place]  Medication/Company: Asmanex and Proventil HFA / [employer] Patient application portion:  Education officer, museum portion: Magazine features editor to [employer], [facility] Provider address/fax verified via: Office website    Follow up:  Will follow up with patient in 5-10 business days to confirm application(s) have been received.  Tonisha Silvey P. Evetta Renner, [place] Management [phone]

## 2019-03-05 ENCOUNTER — Encounter: Payer: Self-pay | Admitting: Medical

## 2019-03-06 ENCOUNTER — Telehealth: Payer: Self-pay | Admitting: Medical

## 2019-03-06 DIAGNOSIS — G629 Polyneuropathy, unspecified: Secondary | ICD-10-CM

## 2019-03-06 NOTE — Telephone Encounter (Signed)
Referral to neurologist placed. She request neurologist in [facility].

## 2019-03-06 NOTE — Telephone Encounter (Signed)
Sent referral to [facility] Neuro in [facility]

## 2019-03-07 ENCOUNTER — Other Ambulatory Visit: Payer: Self-pay

## 2019-03-07 ENCOUNTER — Encounter: Payer: HMO | Admitting: Gastroenterology

## 2019-03-08 ENCOUNTER — Other Ambulatory Visit: Payer: Self-pay

## 2019-03-08 LAB — CUP PACEART INCLINIC DEVICE CHECK
Date Time Interrogation Session: 20201109135121
Implantable Lead Implant Date: 20040804
Implantable Lead Implant Date: 20040804
Implantable Lead Implant Date: 20160324
Implantable Lead Location: 753859
Implantable Lead Location: 753860
Implantable Lead Location: 753860
Implantable Lead Model: 3830
Implantable Lead Model: 4076
Implantable Lead Model: 4076
Implantable Pulse Generator Implant Date: 20160324

## 2019-03-08 NOTE — Patient Outreach (Signed)
[place] [facility]) Care Management  03/08/2019  Alexis [name] 07-Feb-1951 [id number]   In-basket message received from [place], [place] [facility], to contact patient regarding need for transportation. Successful outreach to patient today.  Patient in need of ongoing transportation resource as daughter has returned to work and is not able to transport her to all appointments.  Patient resides outside of service area for [place].  Informed her about [facility] and [employer], however, unsure if they can transport from [name] to [employer].   Messaged Callie Fielding, Supervisor with [employer], to inquire about ability to accommodate transportation for patient.  Will follow up with patient when response is received.   [name], BSW Social Worker [phone]

## 2019-03-12 ENCOUNTER — Other Ambulatory Visit: Payer: Self-pay

## 2019-03-12 ENCOUNTER — Ambulatory Visit: Payer: Self-pay

## 2019-03-12 NOTE — Patient Outreach (Signed)
[place] [facility]) Care Management  03/12/2019  Alexis [name] [birth year] [id number]   Received response from Callie Fielding with [place] and [employer] regarding request for transportation. Per [name], [facility] services parts of [name] so I was encouraged to check with them.  Called [employer] and was told they do not service [name].  Informed [name] of this; she asked for eligibility form to be submitted so she can determine if they can accommodate request.   Attempted to contact patient today to gather additional information needed for application as well as to follow up on referral to [place]. No answer; left voicemail message.  Will attempt to reach again within four business days.  [name], BSW Social Worker [phone]

## 2019-03-13 ENCOUNTER — Other Ambulatory Visit: Payer: Self-pay | Admitting: *Deleted

## 2019-03-13 NOTE — Patient Outreach (Signed)
  [place] [facility]) Care Management Chronic Special Needs Program    03/13/2019  Name: Alexis YANKEY, DOB: 07/08/1950  MRN: [id number]   Ms. Alexis [name] is enrolled in a chronic special needs plan for Diabetes.  Returned call to Mrs. Alexis [name] after she left a message for this RNCM earlier today requesting that Alexis [name], social worker with [place] [facility], be advised that she is trying to return Alexis [name]'s call but does not have her contact number.   Plan: Voice mail left for Mrs. Alexis [name] on her cell number advising her that a message was left for Alexis [name].    Kelli Churn RN, CCM, [place] Management Coordinator [facility] Care Management [phone]

## 2019-03-14 ENCOUNTER — Other Ambulatory Visit: Payer: Self-pay

## 2019-03-14 ENCOUNTER — Ambulatory Visit: Payer: Self-pay

## 2019-03-14 ENCOUNTER — Other Ambulatory Visit: Payer: Self-pay | Admitting: Pharmacy Technician

## 2019-03-14 ENCOUNTER — Encounter: Payer: Self-pay | Admitting: Medical

## 2019-03-14 DIAGNOSIS — I739 Peripheral vascular disease, unspecified: Secondary | ICD-10-CM | POA: Diagnosis not present

## 2019-03-14 DIAGNOSIS — L98491 Non-pressure chronic ulcer of skin of other sites limited to breakdown of skin: Secondary | ICD-10-CM | POA: Diagnosis not present

## 2019-03-14 MED ORDER — PROAIR HFA 108 (90 BASE) MCG/ACT IN AERS: 2.0000 | INHALATION_SPRAY | Freq: Four times a day (QID) | RESPIRATORY_TRACT | 5 refills | Status: DC | PRN

## 2019-03-14 NOTE — Patient Outreach (Signed)
[place] [facility]) Care Management  03/14/2019  Alexis [name] 1951/02/16 [id number]   Second unsuccessful attempt to follow up with patient regarding status of referral to [place] and need for transportation assistance.  Left voicemail message.  Will attempt to reach again within four business days.   [name], BSW Social Worker [phone]

## 2019-03-14 NOTE — Patient Outreach (Signed)
[facility] [facility]) Care Management  03/14/2019  LEANE LORING [birth year] [id number]    Unsuccessful call placed to patient regarding patient assistance application(s) for Asmanex and Proventil HFA with [employer] , HIPAA compliant voicemail left.   Unfortunately patient did not answer the phone. Was calling patient to inquire if she received the application that was mailed to her on 02/28/2019.  Follow up:  Will follow up with 2nd outreach attempt in 3-7 business days.  Delane Wessinger P. Yafet Cline, [place] Management [phone]

## 2019-03-15 ENCOUNTER — Encounter: Payer: Self-pay | Admitting: Medical

## 2019-03-15 ENCOUNTER — Telehealth: Payer: Self-pay | Admitting: Medical

## 2019-03-15 ENCOUNTER — Other Ambulatory Visit: Payer: Self-pay

## 2019-03-15 NOTE — Telephone Encounter (Signed)
Opened to review 

## 2019-03-15 NOTE — Patient Outreach (Signed)
[place] [facility]) Care Management  03/15/2019  Alexis [name] 06-07-1950 [id number]   Received return call from patient today.  Gathered necessary information to complete application for [place] and [employer].  Application submitted to eligibility.  Informed patient that [facility] with Independent Living has attempted to contact her regarding referral for ramp repairs.  Provided her with contact information for Ms. McMasters and encouraged her to call. Will follow up when response is received from [facility].  Will also follow up next month regarding status of referrals to [facility] and Independent Living.   [name], BSW Social Worker [phone]

## 2019-03-18 ENCOUNTER — Telehealth: Payer: Self-pay | Admitting: Medical

## 2019-03-18 MED ORDER — LIDOCAINE HCL 4 % EX LIQD: 10.0000 mL | Freq: Two times a day (BID) | CUTANEOUS | 0 refills | Status: DC

## 2019-03-18 NOTE — Telephone Encounter (Signed)
rx lidocaine topical sent to pharmacy.

## 2019-03-18 NOTE — Telephone Encounter (Signed)
  I am printing letter for patient. Will you fax it Shanon Brow. I don't think I can find rx for battery charger in epic. Maybe I can write rx on presciption pad?    I need you to fax a letter saying I need a charger for my scooter battery. [phone] Sorry to keep asking for things. Put on the fax att: Shanon Brow

## 2019-03-19 ENCOUNTER — Ambulatory Visit (INDEPENDENT_AMBULATORY_CARE_PROVIDER_SITE_OTHER): Payer: HMO | Admitting: Gastroenterology

## 2019-03-19 ENCOUNTER — Other Ambulatory Visit: Payer: Self-pay

## 2019-03-19 ENCOUNTER — Other Ambulatory Visit: Payer: Self-pay | Admitting: Pharmacy Technician

## 2019-03-19 DIAGNOSIS — Z23 Encounter for immunization: Secondary | ICD-10-CM

## 2019-03-19 NOTE — Telephone Encounter (Signed)
Letter faxed.

## 2019-03-19 NOTE — Patient Outreach (Signed)
[place] [facility]) Care Management  03/19/2019  Alexis [name] 08-18-1950 [id number]  Second unsuccessful call placed to patient regarding patient assistance application(s) for Asmanex and Proventil HFA with [employer], HIPAA compliant voicemail left.  Unfortunately patient did not answer the phone. Was calling patient to inquire if she received the application that was mailed to her on 02/28/2019.  Will follow up with 3rd outreach attempt in 3-7 business days if call is not returned.  [name] P. Haniya Fern, [place] Management [phone]

## 2019-03-20 ENCOUNTER — Telehealth: Payer: Self-pay | Admitting: Medical

## 2019-03-20 ENCOUNTER — Other Ambulatory Visit: Payer: Self-pay

## 2019-03-20 NOTE — Telephone Encounter (Signed)
New letter printed. Will you fax it to attention of david.

## 2019-03-20 NOTE — Patient Outreach (Signed)
[place] [facility]) Care Management  03/20/2019  ABY GESSEL Aug 09, 1950 [id number]   Received communication that patient was approved from transportation through [facility]. Attempted to contact patient today to inform her of eligibility and provide number for scheduling.  Left voicemail message.  Will attempt to reach her again if no return call within four business days.  [name], BSW Social Worker [phone]

## 2019-03-21 ENCOUNTER — Other Ambulatory Visit: Payer: Self-pay

## 2019-03-21 NOTE — Patient Outreach (Signed)
[place] [facility]) Care Management  03/21/2019  Alexis [name] [birth year] [id number]    Received communication on 03/20/19 that patient was approved from transportation through [facility]. Second attempt to contact patient today to inform her of eligibility and provide number for scheduling.  Left voicemail message and mailed Unsuccessful Outreach Letter.   Will attempt to reach her again if no return call within four business days.  [name], BSW Social Worker [phone]

## 2019-03-22 ENCOUNTER — Ambulatory Visit: Payer: Self-pay

## 2019-03-23 ENCOUNTER — Telehealth: Payer: Self-pay | Admitting: Medical

## 2019-03-23 NOTE — Telephone Encounter (Signed)
I typed a new letter for pt. She states needs new battery and not the charger is the issue. I thought asked you to send over that new letter. But not sure. Will you look in epic and re-fax that.

## 2019-03-26 NOTE — Telephone Encounter (Signed)
Letter refaxed on 03/23/19 and confirmation received.  Sent patient a message via Pharmacist, community

## 2019-03-27 ENCOUNTER — Other Ambulatory Visit: Payer: Self-pay | Admitting: Pharmacy Technician

## 2019-03-27 ENCOUNTER — Other Ambulatory Visit: Payer: Self-pay

## 2019-03-27 NOTE — Patient Outreach (Signed)
[place] [facility]) Care Management  03/27/2019  Alexis [name] [birth year] [id number]   Successful follow up call to patient today.  Informed her that she was approved for transportation through [place] and [employer] and provided her with number for scheduling.  Patient on schedule next month for follow up on  [facility] for ramp repair.    [name], BSW Social Worker [phone]

## 2019-03-27 NOTE — Patient Outreach (Signed)
[place] [facility]) Care Management  03/27/2019  Alexis [name] 09-28-1950 [id number]   Successful outgoing call placed to patient in regards to [employer] application for Proventil HFA and Asmanex.  Spoke to patient, HIPAA identifiers verified.  Inquired about the patient assistance application that was mailed to her on 02/28/2019 for her inahlers. Patient informed she had her inhalers and was not having any affordability issues at this time. She informed they changed one of her inhalers due to the insurance not paying for the other one though she could not remember which one was changed. Informed patient that if she applied through patient assistance she could receive the medications at no cost through the company and it would prevent her from going into the coverage gap. Patient believed she had the application somewhere but had been mostly bed ridden due to a diabetic ulcer on her foot/neuropathy.  Will close patient assistance case at this time as patient gave no indication as to when she would mail the application in and also seemed disinterested in applying. Will gladly reopen case if the application is mailed in. Will route note to [place].  Kendria Halberg P. Sartaj Hoskin, [place] Management [phone]

## 2019-03-28 ENCOUNTER — Telehealth: Payer: Self-pay | Admitting: Gastroenterology

## 2019-03-28 DIAGNOSIS — Z794 Long term (current) use of insulin: Secondary | ICD-10-CM | POA: Diagnosis not present

## 2019-03-28 DIAGNOSIS — I739 Peripheral vascular disease, unspecified: Secondary | ICD-10-CM | POA: Diagnosis not present

## 2019-03-28 DIAGNOSIS — L98491 Non-pressure chronic ulcer of skin of other sites limited to breakdown of skin: Secondary | ICD-10-CM | POA: Diagnosis not present

## 2019-03-28 DIAGNOSIS — E11621 Type 2 diabetes mellitus with foot ulcer: Secondary | ICD-10-CM | POA: Diagnosis not present

## 2019-03-28 NOTE — Telephone Encounter (Signed)
Please review and advise.

## 2019-04-02 ENCOUNTER — Encounter: Payer: Self-pay | Admitting: Medical

## 2019-04-03 ENCOUNTER — Ambulatory Visit (INDEPENDENT_AMBULATORY_CARE_PROVIDER_SITE_OTHER): Payer: HMO | Admitting: Medical

## 2019-04-03 DIAGNOSIS — L089 Local infection of the skin and subcutaneous tissue, unspecified: Secondary | ICD-10-CM

## 2019-04-03 DIAGNOSIS — G629 Polyneuropathy, unspecified: Secondary | ICD-10-CM | POA: Diagnosis not present

## 2019-04-03 DIAGNOSIS — S31109D Unspecified open wound of abdominal wall, unspecified quadrant without penetration into peritoneal cavity, subsequent encounter: Secondary | ICD-10-CM

## 2019-04-03 DIAGNOSIS — N390 Urinary tract infection, site not specified: Secondary | ICD-10-CM | POA: Diagnosis not present

## 2019-04-03 DIAGNOSIS — R300 Dysuria: Secondary | ICD-10-CM

## 2019-04-03 MED ORDER — TRAMADOL HCL 50 MG PO TABS: 50.0000 mg | ORAL_TABLET | Freq: Two times a day (BID) | ORAL | 0 refills | Status: AC

## 2019-04-03 MED ORDER — CIPROFLOXACIN HCL 500 MG PO TABS: 500.0000 mg | ORAL_TABLET | Freq: Two times a day (BID) | ORAL | 0 refills | Status: DC

## 2019-04-03 NOTE — Progress Notes (Signed)
   Subjective:    Patient ID: Alexis [name], female    DOB: 1950-11-11, 69 y.o.   MRN: [id number]  HPI  Virtual Visit via Telephone Note  I connected with Alexis [name] on 04/03/19 at  1:20 PM EST by telephone and verified that I am speaking with the correct person using two identifiers.  Pt did not check her vitals signs today.  Location: Patient: home Provider: office   I discussed the limitations, risks, security and privacy concerns of performing an evaluation and management service by telephone and the availability of in person appointments. I also discussed with the patient that there may be a patient responsible charge related to this service. The patient expressed understanding and agreed to proceed.   History of Present Illness:   Pt in today reporting urinary symptoms. Some pain in lower back.  Dysuria- x 3 days. Frequent urination- yes Hesitancy- Suprapubic pressure-yes. Fever-no chills-no Nausea-no Vomiting-no CVA pain- some History of UTI-yes Gross hematuria-no.  Odor to urine.  Pt also notes she has some wounds on her stomach.  She sent pictures to me on my chart. I have not seen those yet.  Regarding neurpoathy.   Observations/Objective: General- no acute distress, pleasant, alert and oriented.  Abdomen- non self palpation over bladder does have some pain. Back- when she self palpates has no pain in cva area presently. Saw my chart picture. Superficial appearing wound on abdomen underneath fat fold.  Assessment and Plan: You appear to have a urinary tract infection. I am prescribing antibiotic for the probable infection. Hydrate well. Start cipro During the interim if your signs and symptoms worsen rather than improving please notify us as will need to collect urine culture.   Will review your pictures and then make referral to wound center.   For neuropathy, will consider tramadol as you report your Gi MD states you can take.  Follow up in  7 days or as needed.   Follow Up Instructions:    I discussed the assessment and treatment plan with the patient. The patient was provided an opportunity to ask questions and all were answered. The patient agreed with the plan and demonstrated an understanding of the instructions.   The patient was advised to call back or seek an in-person evaluation if the symptoms worsen or if the condition fails to improve as anticipated.  I provided 30  minutes of non-face-to-face time during this encounter.   Mackie Pai, PA-C   Review of Systems     Objective:   Physical Exam        Assessment & Plan:

## 2019-04-03 NOTE — Patient Instructions (Addendum)
You appear to have a urinary tract infection. I am prescribing antibiotic for the probable infection. Hydrate well. Start cipro During the interim if your signs and symptoms worsen rather than improving please notify us as will need to collect urine culture.   Will review your pictures and then make referral to wound center.(done)  For neuropathy, will consider tramadol as you report your Gi MD states you can take.(on review pt never had before per her report)  Follow up in 10-14 days or as needed.  Pt has also been sending me various message regarding her scooter not working. She reports need new battery so will fill our form and have this not faxed over.

## 2019-04-04 ENCOUNTER — Encounter: Payer: Self-pay | Admitting: Medical

## 2019-04-06 ENCOUNTER — Telehealth: Payer: Self-pay | Admitting: Medical

## 2019-04-06 NOTE — Telephone Encounter (Signed)
If filled out her liberty form for her new battery. I got jasmine to call pt to clarify insurance information that might be lacking. Then fax it over.

## 2019-04-06 NOTE — Telephone Encounter (Signed)
Spoke with patient regarding foot pain. Patient was unaware Tramadol was prescribed. She will call if medication does not alleviate pain.

## 2019-04-07 DIAGNOSIS — I110 Hypertensive heart disease with heart failure: Secondary | ICD-10-CM | POA: Diagnosis not present

## 2019-04-07 DIAGNOSIS — I509 Heart failure, unspecified: Secondary | ICD-10-CM | POA: Diagnosis not present

## 2019-04-07 DIAGNOSIS — G609 Hereditary and idiopathic neuropathy, unspecified: Secondary | ICD-10-CM | POA: Diagnosis not present

## 2019-04-07 DIAGNOSIS — L97411 Non-pressure chronic ulcer of right heel and midfoot limited to breakdown of skin: Secondary | ICD-10-CM | POA: Diagnosis not present

## 2019-04-07 DIAGNOSIS — I739 Peripheral vascular disease, unspecified: Secondary | ICD-10-CM | POA: Diagnosis not present

## 2019-04-07 DIAGNOSIS — N390 Urinary tract infection, site not specified: Secondary | ICD-10-CM | POA: Diagnosis not present

## 2019-04-07 DIAGNOSIS — I96 Gangrene, not elsewhere classified: Secondary | ICD-10-CM | POA: Diagnosis not present

## 2019-04-07 DIAGNOSIS — I4891 Unspecified atrial fibrillation: Secondary | ICD-10-CM | POA: Diagnosis not present

## 2019-04-07 DIAGNOSIS — S31109D Unspecified open wound of abdominal wall, unspecified quadrant without penetration into peritoneal cavity, subsequent encounter: Secondary | ICD-10-CM | POA: Diagnosis not present

## 2019-04-07 DIAGNOSIS — E119 Type 2 diabetes mellitus without complications: Secondary | ICD-10-CM | POA: Diagnosis not present

## 2019-04-07 DIAGNOSIS — E11621 Type 2 diabetes mellitus with foot ulcer: Secondary | ICD-10-CM | POA: Diagnosis not present

## 2019-04-10 ENCOUNTER — Encounter: Payer: Self-pay | Admitting: Medical

## 2019-04-10 NOTE — Telephone Encounter (Signed)
Patient is calling back about foot pain. She is requesting an order for an xray. Please advise when and if order can be put in. Thanks

## 2019-04-11 ENCOUNTER — Other Ambulatory Visit: Payer: Self-pay

## 2019-04-12 ENCOUNTER — Encounter: Payer: Self-pay | Admitting: Medical

## 2019-04-12 ENCOUNTER — Ambulatory Visit (HOSPITAL_BASED_OUTPATIENT_CLINIC_OR_DEPARTMENT_OTHER)
Admission: RE | Admit: 2019-04-12 | Discharge: 2019-04-12 | Disposition: A | Discharge status: Home / Self Care | Payer: HMO | Source: Ambulatory Visit | Attending: Medical | Admitting: Medical

## 2019-04-12 ENCOUNTER — Ambulatory Visit (INDEPENDENT_AMBULATORY_CARE_PROVIDER_SITE_OTHER): Payer: HMO | Admitting: Medical

## 2019-04-12 VITALS — BP 137/60 | HR 57 | Temp 96.7°F | Resp 12 | Ht 62.0 in | Wt 197.2 lb

## 2019-04-12 DIAGNOSIS — M25571 Pain in right ankle and joints of right foot: Secondary | ICD-10-CM | POA: Diagnosis not present

## 2019-04-12 DIAGNOSIS — L089 Local infection of the skin and subcutaneous tissue, unspecified: Secondary | ICD-10-CM | POA: Diagnosis not present

## 2019-04-12 DIAGNOSIS — S91001A Unspecified open wound, right ankle, initial encounter: Secondary | ICD-10-CM | POA: Diagnosis not present

## 2019-04-12 DIAGNOSIS — N289 Disorder of kidney and ureter, unspecified: Secondary | ICD-10-CM | POA: Diagnosis not present

## 2019-04-12 DIAGNOSIS — M2550 Pain in unspecified joint: Secondary | ICD-10-CM

## 2019-04-12 LAB — COMPREHENSIVE METABOLIC PANEL
ALT: 34 U/L (ref 0–35)
AST: 49 U/L — ABNORMAL HIGH (ref 0–37)
Albumin: 4.1 g/dL (ref 3.5–5.2)
Alkaline Phosphatase: 172 U/L — ABNORMAL HIGH (ref 39–117)
BUN: 61 mg/dL — ABNORMAL HIGH (ref 6–23)
CO2: 29 mEq/L (ref 19–32)
Calcium: 10.2 mg/dL (ref 8.4–10.5)
Chloride: 93 mEq/L — ABNORMAL LOW (ref 96–112)
Creatinine, Ser: 1.04 mg/dL (ref 0.40–1.20)
GFR: 52.36 mL/min — ABNORMAL LOW (ref >60.00)
Glucose, Bld: 221 mg/dL — ABNORMAL HIGH (ref 70–99)
Potassium: 4.4 mEq/L (ref 3.5–5.1)
Sodium: 131 mEq/L — ABNORMAL LOW (ref 135–145)
Total Bilirubin: 0.9 mg/dL (ref 0.2–1.2)
Total Protein: 7.8 g/dL (ref 6.0–8.3)

## 2019-04-12 LAB — SEDIMENTATION RATE: Sed Rate: 27 mm/hr (ref 0–30)

## 2019-04-12 LAB — C-REACTIVE PROTEIN: CRP: 1.5 mg/dL (ref 0.5–20.0)

## 2019-04-12 MED ORDER — TRAMADOL HCL 50 MG PO TABS: ORAL_TABLET | ORAL | 0 refills | Status: DC

## 2019-04-12 MED ORDER — DOXYCYCLINE HYCLATE 100 MG PO TABS: 100.0000 mg | ORAL_TABLET | Freq: Two times a day (BID) | ORAL | 0 refills | Status: DC

## 2019-04-12 NOTE — Progress Notes (Signed)
Subjective:    Patient ID: Alexis [name], female    DOB: November 05, 1950, 69 y.o.   MRN: [id number]  HPI Pt in with rt lateral aspect ankle pain for 2 weeks. Pt states she was wearing a boot that podiatrist had advised. Pt has wound to lower extremity/heel. Pt has pvd, diabetes and hx of toe amputation. Pt also has hx of neuropathy. Pt had tramadol last night and it did help recently.  Rt 2nd toe small red area and small bruise at tip.     Review of Systems  Constitutional: Negative for chills, fatigue and fever.  Respiratory: Negative for choking, chest tightness, shortness of breath and wheezing.   Cardiovascular: Negative for palpitations.  Gastrointestinal: Negative for abdominal pain.  Skin:       Rt second toe slight redness.   Neurological: Negative for dizziness, numbness and headaches.  Hematological: Negative for adenopathy. Does not bruise/bleed easily.  Psychiatric/Behavioral: Negative for behavioral problems and confusion.    Past Medical History:  Diagnosis Date  . Allergy   . Anxiety   . Arthritis   . Asthma   . Atrial fibrillation ([place])   . CHF (congestive heart failure) ([place])   . Cirrhosis ([place])   . COPD (chronic obstructive pulmonary disease) ([place])   . Diabetes mellitus without complication ([place])   . Hyperlipidemia   . Hypertension   . Macrocytic anemia 09/04/2018  . Myocardial infarction ([place])    several  . Pacemaker    CRT-P with RV lead and His Bundle lead ( high threshold)   . Peripheral neuropathy   . Pneumonia   . PONV (postoperative nausea and vomiting)    unsure of exactly what happened at [facility] in 2010 (knee surgery) that led to crital care  . Thyroid disease      Social History   Socioeconomic History  . Marital status: Married    Spouse name: Juanda Crumble  . Number of children: 1  . Years of education: Not on file  . Highest education level: Not on file  Occupational History  . 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 Smoking status: Former Smoker    Quit date: 03/01/1998    Years since quitting: 21.1  . Smokeless tobacco: Never Used  Substance and Sexual Activity  . 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  . Drug use: Yes    Types: Marijuana    Comment: remote use  . Sexual activity: Never  Other Topics Concern  . 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 Difficulty of Paying Living Expenses: Patient refused  Food Insecurity: No Food Insecurity  . Worried About Charity fundraiser in the Last Year: Never true  . Ran Out of Food in the Last Year: Never true  Transportation Needs: No Transportation Needs  . Lack of Transportation (Medical): No  . Lack of Transportation (Non-Medical): No  Physical Activity: Unknown  . Days of Exercise per Week: 0 days  . Minutes of Exercise per Session:  Not on file  Stress: No Stress Concern Present  . Feeling of Stress : Only a little  Social Connections: Unknown  . Frequency of Communication with Friends and Family: More than three times a week  . Frequency of Social Gatherings with Friends and Family: Not on file  . Attends Religious Services: Not on file  . Active Member of Clubs or Organizations: Not on file  . Attends Archivist Meetings: Not on file  . Marital Status: Married  Human resources officer Violence:   . Fear of Current or Ex-Partner: Not on file  . Emotionally Abused: Not on file  . Physically Abused: Not on file  . Sexually Abused: Not on file    Past Surgical History:  Procedure Laterality Date  . ABDOMINAL  AORTOGRAM W/LOWER EXTREMITY N/A 04/29/2016   Procedure: Abdominal Aortogram w/Lower Extremity;  Surgeon: Waynetta Sandy, MD;  Location: [facility];  Service: Cardiovascular;  Laterality: N/A;  . ABDOMINAL AORTOGRAM W/LOWER EXTREMITY N/A 05/06/2017   Procedure: ABDOMINAL AORTOGRAM W/LOWER EXTREMITY;  Surgeon: Angelia Mould, MD;  Location: [place] [facility];  Service: Cardiovascular;  Laterality: N/A;  bilateral  . ABDOMINAL HYSTERECTOMY    . AMPUTATION Right 07/30/2016   Procedure: AMPUTATION RIGHT SECOND TOE;  Surgeon: Angelia Mould, MD;  Location: [facility];  Service: Vascular;  Laterality: Right;  . CARDIAC CATHETERIZATION     2005 at [place] N/A 12/25/2018   Procedure: COLONOSCOPY WITH PROPOFOL;  Surgeon: Jackquline Denmark, MD;  Location: [facility];  Service: Endoscopy;  Laterality: N/A;  . ESOPHAGEAL BANDING  12/25/2018   Procedure: ESOPHAGEAL BANDING;  Surgeon: Jackquline Denmark, MD;  Location: [facility];  Service: Endoscopy;;  . ESOPHAGOGASTRODUODENOSCOPY (EGD) WITH PROPOFOL N/A 12/25/2018   Procedure: ESOPHAGOGASTRODUODENOSCOPY (EGD) WITH PROPOFOL;  Surgeon: Jackquline Denmark, MD;  Location: [facility];  Service: Endoscopy;  Laterality: N/A;  . PERIPHERAL VASCULAR ATHERECTOMY Right 04/29/2016   Procedure: Peripheral Vascular Atherectomy-Right Popliteal;  Surgeon: Waynetta Sandy, MD;  Location: [place] [facility];  Service: Cardiovascular;  Laterality: Right;  . PERIPHERAL VASCULAR INTERVENTION Right 04/29/2016   Procedure: Peripheral Vascular Intervention-Right Popliteal;  Surgeon: Waynetta Sandy, MD;  Location: [place] [facility];  Service: Cardiovascular;  Laterality: Right;  POPLITEAL PTA  . RADIOFREQUENCY ABLATION    . TOTAL KNEE ARTHROPLASTY    . TUBAL LIGATION      Family History  Problem Relation Age of Onset  . Diabetes Mother   . Hyperlipidemia Mother   . Diabetes Father   . Hyperlipidemia  Father   . Heart disease Father        before age 50  . Heart attack Father   . Diabetes Brother   . Hyperlipidemia Brother   . Heart attack Brother     Allergies  Allergen Reactions  . Indomethacin Other (See Comments)     Renal Insufficiency  . Pregabalin Other (See Comments)    DIZZINESS   . Sulfa Antibiotics Hives  . Morphine And Related Nausea And Vomiting    Current Outpatient Medications on File Prior to Visit  Medication Sig Dispense Refill  . allopurinol (ZYLOPRIM) 100 MG tablet TAKE ONE TABLET BY MOUTH TWICE DAILY 60 tablet 3  . AMBULATORY NON FORMULARY MEDICATION Motorized scooter.  Diagnosis: Osteoarthritis M19.90 1 each 0  . azelastine (ASTELIN) 0.1 % nasal spray Place 2 sprays into both nostrils 2 (two) times daily. Use in each nostril as directed    .  Continuous Blood Gluc Sensor (FREESTYLE LIBRE 14 DAY SENSOR) MISC     . cyclobenzaprine (FLEXERIL) 10 MG tablet 1 tab po q hs muscle cramps or spasms 15 tablet 0  . ferrous sulfate 325 (65 FE) MG tablet Take 325 mg by mouth daily with breakfast.    . fluticasone (FLONASE) 50 MCG/ACT nasal spray Place 2 sprays into both nostrils at bedtime.    . fluticasone (FLOVENT HFA) 110 MCG/ACT inhaler Inhale 2 puffs into the lungs 2 (two) times daily as needed (bronchitis).    . furosemide (LASIX) 20 MG tablet Take 2 tables in the morning and 1 tablet at noon 270 tablet 1  . Levothyroxine Sodium 150 MCG CAPS Take 1 capsule (150 mcg total) by mouth daily before breakfast. 30 capsule 3  . Lidocaine HCl 4 % LIQD Apply 10 mLs topically 2 (two) times daily. 73 mL 0  . losartan (COZAAR) 25 MG tablet TAKE ONE (1) TABLET BY MOUTH EVERY DAY 90 tablet 0  . metoprolol succinate (TOPROL-XL) 25 MG 24 hr tablet TAKE ONE (1) TABLET BY MOUTH EACH DAY 90 tablet 1  . mupirocin ointment (BACTROBAN) 2 % Apply to area left groin area twice daily. 22 g 0  . NOVOLOG 100 UNIT/ML injection 20-25 units daily    . ondansetron (ZOFRAN-ODT) 4 MG  disintegrating tablet Take 4 mg by mouth every 8 (eight) hours as needed.    . potassium chloride 20 MEQ TBCR Take 20 mEq by mouth daily. 30 tablet 2  . Prenatal Vit-Fe Fumarate-FA (MULTIVITAMIN-PRENATAL) 27-0.8 MG TABS tablet Take 1 tablet by mouth daily. Takes for nail and hair growth     . PROAIR HFA 108 (90 Base) MCG/ACT inhaler Inhale 2 puffs into the lungs every 6 (six) hours as needed for wheezing or shortness of breath. 18 g 5  . psyllium (METAMUCIL SMOOTH TEXTURE) 58.6 % powder Take 1 packet by mouth daily. 283 g 12  . spironolactone (ALDACTONE) 50 MG tablet Take 1 tablet (50 mg total) by mouth daily. 30 tablet 11   No current facility-administered medications on file prior to visit.    BP (!) 139/39 (BP Location: Right Arm, Cuff Size: Large)   Pulse (!) 57   Temp (!) 96.7 F (35.9 C) (Temporal)   Resp 12   Ht 5' 2"  (1.575 m)   Wt 197 lb 3.2 oz (89.4 kg)   SpO2 98%   BMI 36.07 kg/m       Objective:   Physical Exam  General- No acute distress. Pleasant patient. Neck- Full range of motion, no jvd Lungs- Clear, even and unlabored. Heart- regular rate and rhythm. Neurologic- CNII- XII grossly intact.  Rt foot- 2nd toe amputation. Rt great toe small bruise and slight redness around bruise.      Assessment & Plan:  For ankle pain will get xray ankle, inflammation studies and rx tramadol. Diabetes and renal insufficiency limit option.   For skin infection near tip toe rx doxycycline. Watch closely since diabetic and has neuropathy  Follow up with neuro. See if they agree tramadol for neuropathy.  Follow up 10 days or as needed. Virtual phone fine.  Keep seeing podiatrist.    30 minutes spent with pt. 50% of time spent counseling on plan going forward.  Mackie Pai, PA-C

## 2019-04-12 NOTE — Patient Instructions (Addendum)
For ankle pain will get xray ankle, inflammation studies and rx tramadol. Diabetes and renal insufficiency limit option.   For skin infection near tip toe rx doxycycline. Watch closely since diabetic and has neuropathy  Follow up with neuro. See if they agree tramadol for neuropathy.  Follow up 10 days or as needed. Virtual phone fine.  Keep seeing podiatrist.

## 2019-04-13 ENCOUNTER — Other Ambulatory Visit: Payer: Self-pay | Admitting: *Deleted

## 2019-04-13 DIAGNOSIS — M2550 Pain in unspecified joint: Secondary | ICD-10-CM

## 2019-04-13 DIAGNOSIS — R768 Other specified abnormal immunological findings in serum: Secondary | ICD-10-CM

## 2019-04-13 DIAGNOSIS — R7689 Other specified abnormal immunological findings in serum: Secondary | ICD-10-CM

## 2019-04-13 LAB — ANA: Anti Nuclear Antibody (ANA): NEGATIVE

## 2019-04-13 LAB — RHEUMATOID FACTOR: Rheumatoid fact SerPl-aCnc: 41 IU/mL — ABNORMAL HIGH (ref <14)

## 2019-04-16 ENCOUNTER — Other Ambulatory Visit: Payer: Self-pay

## 2019-04-16 ENCOUNTER — Ambulatory Visit: Payer: Self-pay

## 2019-04-16 NOTE — Patient Outreach (Signed)
[facility]) Care Management  04/16/2019  NYHLA MOUNTJOY 1950-03-12 [id number]   Follow up call to [place] regarding referral for ramp repair.  Per representative, two churches have been contacted about assistance.  First church contacted currently does not have volunteers available and stated it may be a while before they do.  Second church was contacted on 04/04/19 and [place] [employer] is awaiting response. Contacted patient today for follow up.  Inquired if patient is still interested in seeking assistance through [place] for tub modification.  Patient is still interested.  Reminded her that [place] Counselor, [facility], has attempted to contact her but has not been successful.  Provided patient with Tracey's contact number again and encouraged her to call as soon as possible.  Informed her that referral will likely be closed out if Linus Orn is not able to connect with her soon. Patient is being transferred to LCSW, Raynaldo Opitz, for future follow up.  [name], BSW Social Worker [phone]

## 2019-04-17 ENCOUNTER — Ambulatory Visit: Payer: HMO | Admitting: Pharmacist

## 2019-04-17 ENCOUNTER — Telehealth: Payer: Self-pay

## 2019-04-17 ENCOUNTER — Encounter: Payer: Self-pay | Admitting: Medical

## 2019-04-17 ENCOUNTER — Ambulatory Visit: Payer: Self-pay

## 2019-04-17 NOTE — Telephone Encounter (Addendum)
ERICA from [facility] called in to give report  To Dr. Harvie Heck to let him know that the patient weight is out side of the original weight. 191   Please give Danae Chen a call at [phone]  Thanks,    I saw pt recently and clinically stable. Pt can they order cmp and bnp. Update me on weight and o2 sat Monday or Tuesday next week. Dx on labs would be chf

## 2019-04-18 NOTE — Telephone Encounter (Signed)
Left detailed message for Alexis [name] at [phone] (number was down wrong in previous message).

## 2019-04-19 ENCOUNTER — Telehealth (INDEPENDENT_AMBULATORY_CARE_PROVIDER_SITE_OTHER): Payer: HMO | Admitting: Cardiology

## 2019-04-19 ENCOUNTER — Telehealth: Payer: Self-pay | Admitting: Medical

## 2019-04-19 ENCOUNTER — Telehealth: Payer: Self-pay | Admitting: Emergency Medicine

## 2019-04-19 ENCOUNTER — Encounter: Payer: Self-pay | Admitting: Cardiology

## 2019-04-19 ENCOUNTER — Telehealth: Payer: Self-pay | Admitting: *Deleted

## 2019-04-19 ENCOUNTER — Other Ambulatory Visit: Payer: Self-pay

## 2019-04-19 VITALS — BP 125/83 | HR 65 | Ht 62.0 in | Wt 191.0 lb

## 2019-04-19 DIAGNOSIS — Z950 Presence of cardiac pacemaker: Secondary | ICD-10-CM | POA: Diagnosis not present

## 2019-04-19 DIAGNOSIS — I4821 Permanent atrial fibrillation: Secondary | ICD-10-CM | POA: Diagnosis not present

## 2019-04-19 DIAGNOSIS — E1140 Type 2 diabetes mellitus with diabetic neuropathy, unspecified: Secondary | ICD-10-CM

## 2019-04-19 DIAGNOSIS — E1151 Type 2 diabetes mellitus with diabetic peripheral angiopathy without gangrene: Secondary | ICD-10-CM | POA: Diagnosis not present

## 2019-04-19 DIAGNOSIS — I10 Essential (primary) hypertension: Secondary | ICD-10-CM

## 2019-04-19 DIAGNOSIS — I739 Peripheral vascular disease, unspecified: Secondary | ICD-10-CM

## 2019-04-19 NOTE — Telephone Encounter (Signed)

## 2019-04-19 NOTE — Progress Notes (Signed)
Virtual Visit via Telephone Note   This visit type was conducted due to national recommendations for restrictions regarding the COVID-19 Pandemic (e.g. social distancing) in an effort to limit this patient's exposure and mitigate transmission in our community.  Due to her co-morbid illnesses, this patient is at least at moderate risk for complications without adequate follow up.  This format is felt to be most appropriate for this patient at this time.  The patient did not have access to video technology/had technical difficulties with video requiring transitioning to audio format only (telephone).  All issues noted in this document were discussed and addressed.  No physical exam could be performed with this format.  Please refer to the patient's chart for her  consent to telehealth for [facility].  Evaluation Performed:  Follow-up visit  This visit type was conducted due to national recommendations for restrictions regarding the COVID-19 Pandemic (e.g. social distancing).  This format is felt to be most appropriate for this patient at this time.  All issues noted in this document were discussed and addressed.  No physical exam was performed (except for noted visual exam findings with Video Visits).  Please refer to the patient's chart (MyChart message for video visits and phone note for telephone visits) for the patient's consent to telehealth for [facility]  Date:  04/19/2019  ID: Alexis [name], DOB 03-08-1950, MRN [id number]   Patient Location: [place] [address]   Provider location:   [place] Office  PCP:  Mackie Pai, [place]  Cardiologist:  Jenne Campus, MD     Chief Complaint: Doing well cardiac wise  History of Present Illness:    Alexis [name] is a 69 y.o. female  who presents via audio/video conferencing for a telehealth visit today.  With past medical history significant for permanent atrial fibrillation, status post  pacemaker implantation, essential hypertension, diastolic congestive heart failure, cirrhosis of the liver, peripheral arterial disease, esophageal varices.  She does have virtual visit with me today this is secondary to icy weather that we have today.  Cardiac wise doing well described to have very rare episode of palpitations.  Not much shortness of breath but at the same time she tells me that her ability to exercise is very limited because of severe pain of her lower extremities which is neuropathic pain.  She is scheduled to see rheumatologist she is also scheduled to see neurologist hoping that that will help with her pain.   The patient does not have symptoms concerning for COVID-19 infection (fever, chills, cough, or new SHORTNESS OF BREATH).    Prior CV studies:   The following studies were reviewed today:       Past Medical History:  Diagnosis Date  . Allergy   . Anxiety   . Arthritis   . Asthma   . Atrial fibrillation ([place])   . CHF (congestive heart failure) ([place])   . Cirrhosis ([place])   . COPD (chronic obstructive pulmonary disease) ([place])   . Diabetes mellitus without complication ([place])   . Hyperlipidemia   . Hypertension   . Macrocytic anemia 09/04/2018  . Myocardial infarction ([place])    several  . Pacemaker    CRT-P with RV lead and His Bundle lead ( high threshold)   . Peripheral neuropathy   . Pneumonia   . PONV (postoperative nausea and vomiting)    unsure of exactly what happened at [facility] in 2010 (knee surgery) that led  to crital care  . Thyroid disease     Past Surgical History:  Procedure Laterality Date  . ABDOMINAL AORTOGRAM W/LOWER EXTREMITY N/A 04/29/2016   Procedure: Abdominal Aortogram w/Lower Extremity;  Surgeon: Waynetta Sandy, MD;  Location: [facility];  Service: Cardiovascular;  Laterality: N/A;  . ABDOMINAL AORTOGRAM W/LOWER EXTREMITY N/A 05/06/2017   Procedure: ABDOMINAL AORTOGRAM W/LOWER EXTREMITY;  Surgeon: Angelia Mould, MD;  Location: [facility];  Service: Cardiovascular;  Laterality: N/A;  bilateral  . ABDOMINAL HYSTERECTOMY    . AMPUTATION Right 07/30/2016   Procedure: AMPUTATION RIGHT SECOND TOE;  Surgeon: Angelia Mould, MD;  Location: [facility];  Service: Vascular;  Laterality: Right;  . CARDIAC CATHETERIZATION     2005 at [place] N/A 12/25/2018   Procedure: COLONOSCOPY WITH PROPOFOL;  Surgeon: Jackquline Denmark, MD;  Location: [facility];  Service: Endoscopy;  Laterality: N/A;  . ESOPHAGEAL BANDING  12/25/2018   Procedure: ESOPHAGEAL BANDING;  Surgeon: Jackquline Denmark, MD;  Location: [facility];  Service: Endoscopy;;  . ESOPHAGOGASTRODUODENOSCOPY (EGD) WITH PROPOFOL N/A 12/25/2018   Procedure: ESOPHAGOGASTRODUODENOSCOPY (EGD) WITH PROPOFOL;  Surgeon: Jackquline Denmark, MD;  Location: [facility];  Service: Endoscopy;  Laterality: N/A;  . PERIPHERAL VASCULAR ATHERECTOMY Right 04/29/2016   Procedure: Peripheral Vascular Atherectomy-Right Popliteal;  Surgeon: Waynetta Sandy, MD;  Location: [facility];  Service: Cardiovascular;  Laterality: Right;  . PERIPHERAL VASCULAR INTERVENTION Right 04/29/2016   Procedure: Peripheral Vascular Intervention-Right Popliteal;  Surgeon: Waynetta Sandy, MD;  Location: [facility];  Service: Cardiovascular;  Laterality: Right;  POPLITEAL PTA  . RADIOFREQUENCY ABLATION    . TOTAL KNEE ARTHROPLASTY    . TUBAL LIGATION       Current Meds  Medication Sig  . allopurinol (ZYLOPRIM) 100 MG tablet TAKE ONE TABLET BY MOUTH TWICE DAILY  . AMBULATORY NON FORMULARY MEDICATION Motorized scooter.  Diagnosis: Osteoarthritis M19.90  . azelastine (ASTELIN) 0.1 % nasal spray Place 2 sprays into both nostrils 2 (two) times daily. Use in each nostril as directed  . Continuous Blood Gluc Sensor (FREESTYLE LIBRE 14 DAY SENSOR) MISC   . doxycycline (VIBRA-TABS) 100 MG tablet Take 1 tablet (100 mg  total) by mouth 2 (two) times daily.  . fluticasone (FLONASE) 50 MCG/ACT nasal spray Place 2 sprays into both nostrils at bedtime.  . furosemide (LASIX) 20 MG tablet Take 2 tables in the morning and 1 tablet at noon  . levocetirizine (XYZAL) 5 MG tablet Take 5 mg by mouth at bedtime.  . Levothyroxine Sodium 150 MCG CAPS Take 1 capsule (150 mcg total) by mouth daily before breakfast.  . Lidocaine HCl 4 % LIQD Apply 10 mLs topically 2 (two) times daily.  Marland Kitchen losartan (COZAAR) 25 MG tablet TAKE ONE (1) TABLET BY MOUTH EVERY DAY  . metoprolol succinate (TOPROL-XL) 25 MG 24 hr tablet TAKE ONE (1) TABLET BY MOUTH EACH DAY  . mupirocin ointment (BACTROBAN) 2 % Apply to area left groin area twice daily.  Marland Kitchen NOVOLOG 100 UNIT/ML injection 20-25 units daily  . ondansetron (ZOFRAN-ODT) 4 MG disintegrating tablet Take 4 mg by mouth every 8 (eight) hours as needed.  . Prenatal Vit-Fe Fumarate-FA (MULTIVITAMIN-PRENATAL) 27-0.8 MG TABS tablet Take 1 tablet by mouth daily. Takes for nail and hair growth   . psyllium (METAMUCIL SMOOTH TEXTURE) 58.6 % powder Take 1 packet by mouth daily.  Marland Kitchen spironolactone (ALDACTONE) 50 MG tablet Take 1 tablet (50 mg  total) by mouth daily.  . traMADol (ULTRAM) 50 MG tablet 1 tab po q 8 hrs prn pain      Family History: The patient's family history includes Diabetes in her brother, father, and mother; Heart attack in her brother and father; Heart disease in her father; Hyperlipidemia in her brother, father, and mother.   ROS:   Please see the history of present illness.     All other systems reviewed and are negative.   Labs/Other Tests and Data Reviewed:     Recent Labs: 09/09/2018: B Natriuretic Peptide 133.5 10/27/2018: NT-Pro BNP 621 01/17/2019: Hemoglobin 12.3; Platelets 65.0 02/09/2019: Magnesium 1.7 04/12/2019: ALT 34; BUN 61; Creatinine, Ser 1.04; Potassium 4.4; Sodium 131  Recent Lipid Panel    Component Value Date/Time   CHOL 93 (L) 05/10/2018 0944   TRIG  147 05/10/2018 0944   HDL 31 (L) 05/10/2018 0944   CHOLHDL 3.0 05/10/2018 0944   CHOLHDL 6 04/10/2015 1040   VLDL 50.8 (H) 04/10/2015 1040   LDLCALC 33 05/10/2018 0944   LDLDIRECT 111.0 04/10/2015 1040      Exam:    Vital Signs:  BP 125/83   Pulse 65   Ht 5' 2"  (1.575 m)   Wt 191 lb (86.6 kg)   BMI 34.93 kg/m     Wt Readings from Last 3 Encounters:  04/19/19 191 lb (86.6 kg)  04/12/19 197 lb 3.2 oz (89.4 kg)  01/15/19 218 lb (98.9 kg)     Well nourished, well developed in no acute distress. Alert awake and x3.  She was unable to establish video link, therefore, we took over the phone.  Denies having any chest pain tightness squeezing pressure burning chest while talking to me.   Diagnosis for this visit:   1. Peripheral vascular disease ([place])   2. Permanent atrial fibrillation ([place])   3. Essential hypertension   4. Pacemaker      ASSESSMENT & PLAN:    1.  Permanent atrial fibrillation.  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  Rate is controlled.  She does not have any symptoms from that problem. 2.  Peripheral vascular disease follow-up by Dr. Rachelle Hora. 3.  Essential hypertension blood pressure controlled continue present management. 4.  Diabetes followed by GI medicine team. 5.  Neuropathic pain difficult to manage scheduled to see neurology. 6.  Pacemaker present, last interrogation of the device happen in November 2020.  COVID-19 Education: The signs and symptoms of COVID-19 were discussed with the patient and how to seek care for testing (follow up with PCP or arrange E-visit).  The importance of social distancing was discussed today.  Patient Risk:   After full review of this patients clinical status, I feel that they are at least moderate risk at this time.  Time:   Today, I have spent 5 minutes with the patient with telehealth technology discussing pt health issues.  I spent  15 minutes reviewing her chart before the visit.  Visit was finished at 2:14 PM.    Medication Adjustments/Labs and Tests Ordered: Current medicines are reviewed at length with the patient today.  Concerns regarding medicines are outlined above.  No orders of the defined types were placed in this encounter.  Medication changes: No orders of the defined types were placed in this encounter.    Disposition: Follow-up in 5 months  Signed, Park Liter, MD, [facility] 04/19/2019 2:10 PM    [facility]

## 2019-04-19 NOTE — Telephone Encounter (Signed)
Left message for patient to return call about visit with Dr. Agustin Cree today. She only needs 5 month follow up.

## 2019-04-19 NOTE — Patient Instructions (Signed)
Medication Instructions:  Your physician recommends that you continue on your current medications as directed. Please refer to the Current Medication list given to you today.  *If you need a refill on your cardiac medications before your next appointment, please call your pharmacy*  Lab Work: None.  If you have labs (blood work) drawn today and your tests are completely normal, you will receive your results only by: Marland Kitchen MyChart Message (if you have MyChart) OR . A paper copy in the mail If you have any lab test that is abnormal or we need to change your treatment, we will call you to review the results.  Testing/Procedures: None.   Follow-Up: At [facility], you and your health needs are our priority.  As part of our continuing mission to provide you with exceptional heart care, we have created designated Provider Care Teams.  These Care Teams include your primary Cardiologist (physician) and Advanced Practice Providers (APPs -  Physician Assistants and Nurse Practitioners) who all work together to provide you with the care you need, when you need it.  Your next appointment:   5 month(s)  The format for your next appointment:   In Person  Provider:   Jenne Campus, MD  Other Instructions

## 2019-04-19 NOTE — Chronic Care Management (AMB) (Signed)
  Chronic Care Management   Note  04/19/2019 Name: Alexis [name] MRN: [id number] DOB: 1950/09/03  Alexis [name] is a 69 y.o. year old female who is a primary care patient of Saguier, Iris Pert. I reached out to [name] by phone today in response to a referral sent by Ms. Elliot Dally Canevari's PCP, Saguier, Percell Miller, PA-C.   Ms. Threats was given information about Chronic Care Management services today including:  1. CCM service includes personalized support from designated clinical staff supervised by her physician, including individualized plan of care and coordination with other care providers 2. 24/7 contact phone numbers for assistance for urgent and routine care needs. 3. Service will only be billed when office clinical staff spend 20 minutes or more in a month to coordinate care. 4. Only one practitioner may furnish and bill the service in a calendar month. 5. The patient may stop CCM services at any time (effective at the end of the month) by phone call to the office staff. 6. The patient will be responsible for cost sharing (co-pay) of up to 20% of the service fee (after annual deductible is met).  Patient agreed to services and verbal consent obtained.   Follow up plan:   Raynicia Dukes [employer] Scheduler

## 2019-04-20 NOTE — Telephone Encounter (Signed)
Left another message for patient to return call.

## 2019-04-23 ENCOUNTER — Ambulatory Visit (INDEPENDENT_AMBULATORY_CARE_PROVIDER_SITE_OTHER): Payer: HMO | Admitting: Medical

## 2019-04-23 ENCOUNTER — Other Ambulatory Visit: Payer: Self-pay

## 2019-04-23 ENCOUNTER — Other Ambulatory Visit: Payer: Self-pay | Admitting: *Deleted

## 2019-04-23 ENCOUNTER — Telehealth: Payer: Self-pay | Admitting: Medical

## 2019-04-23 VITALS — Wt 193.0 lb

## 2019-04-23 DIAGNOSIS — R1110 Vomiting, unspecified: Secondary | ICD-10-CM

## 2019-04-23 DIAGNOSIS — R059 Cough, unspecified: Secondary | ICD-10-CM

## 2019-04-23 DIAGNOSIS — T148XXD Other injury of unspecified body region, subsequent encounter: Secondary | ICD-10-CM

## 2019-04-23 DIAGNOSIS — R1013 Epigastric pain: Secondary | ICD-10-CM

## 2019-04-23 DIAGNOSIS — R197 Diarrhea, unspecified: Secondary | ICD-10-CM

## 2019-04-23 DIAGNOSIS — Z5189 Encounter for other specified aftercare: Secondary | ICD-10-CM | POA: Diagnosis not present

## 2019-04-23 DIAGNOSIS — L089 Local infection of the skin and subcutaneous tissue, unspecified: Secondary | ICD-10-CM | POA: Diagnosis not present

## 2019-04-23 DIAGNOSIS — R05 Cough: Secondary | ICD-10-CM

## 2019-04-23 DIAGNOSIS — D500 Iron deficiency anemia secondary to blood loss (chronic): Secondary | ICD-10-CM

## 2019-04-23 MED ORDER — CLINDAMYCIN HCL 150 MG PO CAPS: 150.0000 mg | ORAL_CAPSULE | Freq: Three times a day (TID) | ORAL | 0 refills | Status: DC

## 2019-04-23 NOTE — Telephone Encounter (Signed)
Hey Doristine Section called to give oxygen level  96  weight  193 pain level 8  Wanted to take pain  meds later  drew Blood today will take lab Please call back to confirm info [phone]

## 2019-04-23 NOTE — Patient Instructions (Addendum)
You have hx of diabetes, peripheral vascular disease and non healing wounds. Concern for active infection. I want you to come in this afternoon at 3:20 so we can get wound culture. After wound review will put in referral to second opinion from new podiatrist.   Considering  prescribing clindamycin pending wound culture.(after reviewed in office did rx clindamycin)  Folow up date to be determined.

## 2019-04-23 NOTE — Progress Notes (Signed)
   Subjective:    Patient ID: Alexis [name], female    DOB: 1950-03-03, 69 y.o.   MRN: [id number]  HPI  Virtual Visit via Video Note  I connected with Alexis [name] on 04/23/19 at 11:20 AM EST by a video enabled telemedicine application and verified that I am speaking with the correct person using two identifiers.  Location: Patient: home Provider: office   I discussed the limitations of evaluation and management by telemedicine and the availability of in person appointments. The patient expressed understanding and agreed to proceed.  History of Present Illness:  Pt had rt medial heel wound. She has wound that podiatrist knows about and per pt he did some debridement on last visit with him. That was about one month ago per pt. Also has pinkish area to rt great toe and small dark  At base of medial toe.  Pt has seen home health care nurse. Pt states wound care has evaluated her. She is getting some redness to her rt great toe.   Pt is wanting a second opinion from different podiatrist.  Pt states she has some drainage from heel wound.    Observations/Objective:  General-no acute distress, pleasant, oriented. Lungs- on inspection lungs appear unlabored. Neck- no tracheal deviation or jvd on inspection. Neuro- gross motor function appears intact. Rt foot- heel look like dried yellow callous on videio. Rt great toe- small dark area medial great toe. And above this area looks pinkish red.   Assessment and Plan: You have hx of diabetes, peripheral vascular disease and non healing wounds. Concern for active infection. I want you to come in this afternoon at 3:20 so we can get wound culture. After wound review will put in referral to second opinion from new podiatrist.   Considering  prescribing clindamycin pending wound culture.  Folow up date to be determined.  Mackie Pai, PA-C  Follow Up Instructions:    I discussed the assessment and treatment plan with the  patient. The patient was provided an opportunity to ask questions and all were answered. The patient agreed with the plan and demonstrated an understanding of the instructions.   The patient was advised to call back or seek an in-person evaluation if the symptoms worsen or if the condition fails to improve as anticipated.  I provided 30 minutes of non-face-to-face time during this encounter.   Mackie Pai, PA-C     Review of Systems     Objective:   Physical Exam        Assessment & Plan:

## 2019-04-23 NOTE — Addendum Note (Signed)
Addended by: Anabel Halon on: 04/23/2019 03:45 PM   Modules accepted: Orders

## 2019-04-23 NOTE — Patient Outreach (Signed)
Care coordination, HTA NP Referral for in home visit.  Concern that pt may need more aggressive care for her foot wounds.  Called [facility] and spoke with Clelia Croft, LPN who saw pt earlier today for wound care. She reports she is only providing wound care for her abdomen. There are no orders for foot wound care. She does report pt is going to her podiatrist today.  Advised NP would like to make a joint visit with a nurse to avoid having too many providers making individual calls and visits to her. Danae Chen took my name and contact information and will advise the nurse care manager, Darin Engels, RN to contact me to discuss by request.  NP would be available to go tomorrow or Wednesday.  Alexis [name]. [name], MSN, [facility] Gerontological Nurse Practitioner [facility] Care Management [phone]

## 2019-04-24 ENCOUNTER — Other Ambulatory Visit: Payer: Self-pay | Admitting: *Deleted

## 2019-04-24 NOTE — Patient Outreach (Addendum)
[facility]) Care Management   04/24/2019  AARON BOEH 08/12/1950 [id number]  Alexis [name] is an 69 y.o. female  Subjective: HTA NP Referral - concern for pt diabetic foot wounds,  Objective:   Review of Systems  Constitutional: Negative.   HENT: Negative.   Eyes: Negative.   Respiratory: Negative.   Cardiovascular: Negative.   Gastrointestinal: Positive for nausea and vomiting.  Musculoskeletal: Positive for gait problem.       Unsteady gait due to multiple diabetic foot lesions on R: hx second toe amputation. Foot is warm to touch, very faint pedal pulse which is easily obliterated.    Skin: Positive for color change and wound.  Allergic/Immunologic: Negative.   Hematological: Negative.   Psychiatric/Behavioral: Negative.    VS: BP: 120/50, Pulse: 68, Resp: 18, NFBS 164 Wt: 191   Physical Exam Constitutional:      Appearance: She is obese.  Cardiovascular:     Rate and Rhythm: Rhythm irregular.     Comments: Faint R pedal, obliterates easily. Pulmonary:     Effort: Pulmonary effort is normal.     Breath sounds: Normal breath sounds.  Musculoskeletal:        General: Deformity present.     Comments: Unsteady gait due to multiple diabetic foot lesions on R: hx second toe amputation. Foot is warm to touch, very faint pedal pulse which is easily obliterated.    Skin:    General: Skin is warm.     Capillary Refill: Capillary refill takes more than 3 seconds.     Findings: Lesion present.     Comments: #1 current pressure area distal R great toe unstageable, black area but does not give the impression of being into the connective tissue, Very slow cap refill. Approx 1 cm. #2 Lateral Great toe also has a small unstageable area, black 1 cm #3 charcot foot diabetic wound, which needs debridement, no rednesser deformity. or drainage.Central wound .5cm, entire area, 1.5.cm #4 R heel wound unstageable, central wound is draining, it is the size of a  pencil eraser, surrounding this is very thick layer of dead skin which needs debridement 2 cm, surrounding this the affected pressure area in approximately 3 cm x 3 cm, this is red with very slow cap refill. #5 Lateral malleolus 2.5 cm stage II, no drainage.  3rd, 4th and 5th toes are red with very slow cap refill no open lesions, very slight pressure areas on anterior side of first joints. All these toes have hammer toe deformity.  Neurological:     General: No focal deficit present.     Mental Status: She is alert and oriented to person, place, and time.  Psychiatric:        Mood and Affect: Mood normal.    Encounter Medications:   Outpatient Encounter Medications as of 04/24/2019  Medication Sig  . allopurinol (ZYLOPRIM) 100 MG tablet TAKE ONE TABLET BY MOUTH TWICE DAILY  . AMBULATORY NON FORMULARY MEDICATION Motorized scooter.  Diagnosis: Osteoarthritis M19.90  . azelastine (ASTELIN) 0.1 % nasal spray Place 2 sprays into both nostrils 2 (two) times daily. Use in each nostril as directed  . clindamycin (CLEOCIN) 150 MG capsule Take 1 capsule (150 mg total) by mouth 3 (three) times daily.  . Continuous Blood Gluc Sensor (FREESTYLE LIBRE 14 DAY SENSOR) MISC   . doxycycline (VIBRA-TABS) 100 MG tablet Take 1 tablet (100 mg total) by mouth 2 (two) times daily.  . fluticasone (FLONASE) 50 MCG/ACT nasal spray  Place 2 sprays into both nostrils at bedtime.  . furosemide (LASIX) 20 MG tablet Take 2 tables in the morning and 1 tablet at noon  . levocetirizine (XYZAL) 5 MG tablet Take 5 mg by mouth at bedtime.  . Levothyroxine Sodium 150 MCG CAPS Take 1 capsule (150 mcg total) by mouth daily before breakfast.  . Lidocaine HCl 4 % LIQD Apply 10 mLs topically 2 (two) times daily.  Marland Kitchen losartan (COZAAR) 25 MG tablet TAKE ONE (1) TABLET BY MOUTH EVERY DAY  . metoprolol succinate (TOPROL-XL) 25 MG 24 hr tablet TAKE ONE (1) TABLET BY MOUTH EACH DAY  . mupirocin ointment (BACTROBAN) 2 % Apply to area left  groin area twice daily.  Marland Kitchen NOVOLOG 100 UNIT/ML injection 20-25 units daily  . ondansetron (ZOFRAN-ODT) 4 MG disintegrating tablet Take 4 mg by mouth every 8 (eight) hours as needed.  . Prenatal Vit-Fe Fumarate-FA (MULTIVITAMIN-PRENATAL) 27-0.8 MG TABS tablet Take 1 tablet by mouth daily. Takes for nail and hair growth   . psyllium (METAMUCIL SMOOTH TEXTURE) 58.6 % powder Take 1 packet by mouth daily.  Marland Kitchen spironolactone (ALDACTONE) 50 MG tablet Take 1 tablet (50 mg total) by mouth daily.  . traMADol (ULTRAM) 50 MG tablet 1 tab po q 8 hrs prn pain   No facility-administered encounter medications on file as of 04/24/2019.   PLAN: Advised pt to soak her R foot daily in tepid water with small amount of Epsom's salt, dry and gently rub the areas of excessive dry skin to remove as much as possible. Apply mupiricin oint to heel wound and cover with a large band aid. Elevate leg and float the heel avoiding pressure. Continue taking your antibiotic prescribed.  Will need podiatry or orthopedic eval and vascular assessment.  [name]. [name], MSN, [facility] Gerontological Nurse Practitioner [facility] Care Management [phone]

## 2019-04-25 ENCOUNTER — Encounter: Payer: Self-pay | Admitting: Medical

## 2019-04-25 ENCOUNTER — Telehealth: Payer: Self-pay | Admitting: Medical

## 2019-04-25 NOTE — Telephone Encounter (Signed)
Proceed with EGD with possible EVL at [facility] at my next [facility] day Priority 1  RG

## 2019-04-25 NOTE — Telephone Encounter (Signed)
I saw message from NP who is going to pt house. She states pt needs to see podiatrist and vascular surgeon asap. I have already put in referral to new podiatrist at patient request. She has peripheral vascular disease. I would recommend that pt be seen in ED at [facility], [facility] or [facility] She explains had not made any progress with old podiatrist and foot wounds. Expecting to get in with new podiatrist asap will be challenge. In fact she may need to see both within days. May need admission based on NP request stating needs to be seen asap. That way can be seen quicker. Or may need iv antbiotics.

## 2019-04-25 NOTE — Telephone Encounter (Signed)
Ok reviewed message and updated vitals.   Don't understand please call back to confirm info.  Regarding pain. Pt is seeing neurologist tomorrow to discuss neuropathy.

## 2019-04-26 ENCOUNTER — Encounter: Payer: Self-pay | Admitting: Medical

## 2019-04-26 ENCOUNTER — Telehealth: Payer: Self-pay | Admitting: Medical

## 2019-04-26 ENCOUNTER — Other Ambulatory Visit: Payer: Self-pay

## 2019-04-26 ENCOUNTER — Ambulatory Visit: Payer: HMO | Admitting: Pharmacist

## 2019-04-26 ENCOUNTER — Telehealth: Payer: Self-pay

## 2019-04-26 DIAGNOSIS — R1110 Vomiting, unspecified: Secondary | ICD-10-CM

## 2019-04-26 DIAGNOSIS — I739 Peripheral vascular disease, unspecified: Secondary | ICD-10-CM

## 2019-04-26 DIAGNOSIS — R05 Cough: Secondary | ICD-10-CM

## 2019-04-26 DIAGNOSIS — I10 Essential (primary) hypertension: Secondary | ICD-10-CM

## 2019-04-26 DIAGNOSIS — R059 Cough, unspecified: Secondary | ICD-10-CM

## 2019-04-26 DIAGNOSIS — E039 Hypothyroidism, unspecified: Secondary | ICD-10-CM

## 2019-04-26 DIAGNOSIS — D500 Iron deficiency anemia secondary to blood loss (chronic): Secondary | ICD-10-CM

## 2019-04-26 DIAGNOSIS — L089 Local infection of the skin and subcutaneous tissue, unspecified: Secondary | ICD-10-CM

## 2019-04-26 DIAGNOSIS — I4821 Permanent atrial fibrillation: Secondary | ICD-10-CM

## 2019-04-26 DIAGNOSIS — E11621 Type 2 diabetes mellitus with foot ulcer: Secondary | ICD-10-CM

## 2019-04-26 DIAGNOSIS — R1013 Epigastric pain: Secondary | ICD-10-CM

## 2019-04-26 DIAGNOSIS — I5042 Chronic combined systolic (congestive) and diastolic (congestive) heart failure: Secondary | ICD-10-CM

## 2019-04-26 DIAGNOSIS — R197 Diarrhea, unspecified: Secondary | ICD-10-CM

## 2019-04-26 DIAGNOSIS — T148XXD Other injury of unspecified body region, subsequent encounter: Secondary | ICD-10-CM

## 2019-04-26 LAB — WOUND CULTURE
MICRO NUMBER:: 10173347
SPECIMEN QUALITY:: ADEQUATE

## 2019-04-26 MED ORDER — DICLOXACILLIN SODIUM 500 MG PO CAPS: 500.0000 mg | ORAL_CAPSULE | Freq: Three times a day (TID) | ORAL | 0 refills | Status: DC

## 2019-04-26 NOTE — Telephone Encounter (Signed)
Alexis [name] called Dr Zigmund Daniel and below is a summary of conversation:  Discussed potential complications due to poor vascular flow. Md did not think ED visit immediate necessary but vascular appointment needed soon.  Will route to [facility] for follow up.

## 2019-04-26 NOTE — Telephone Encounter (Signed)
This encounter was created in error - please disregard.

## 2019-04-26 NOTE — Telephone Encounter (Signed)
Called and spoke with patient-patient informed of MD recommendations; patient is agreeable with plan of care and has been scheduled for EGD with possible EVL at [facility] on 05/01/2019 at 8:30 am; COVID screening has been scheduled for 04/27/2019 at 1:05 pm;  Instructions have been sent to the patient via MyChart and patient verbalized understanding of information/instructions;  Patient was advised to call the office at [phone] if questions/concerns arise; amb ref has been sent; hospital orders have been placed in Epic;

## 2019-04-26 NOTE — Telephone Encounter (Signed)
Tried to talk with pt today. No answer so sent my chart message on her final culture result

## 2019-04-26 NOTE — Patient Outreach (Signed)
  [facility]) Care Management Chronic Special Needs Program    04/26/2019  Name: Alexis [name], Alexis [name] [name]: August 20, 1950  MRN: [id number]  Care coordinator:  Ms. Jalin Erpelding is enrolled in a chronic special needs plan for Diabetes. Update received from Deloria Lair, RN, NP who completed home assessment with client on 04/24/19. Client with several diabetic foot wounds.  Recommendations were given and [name] to forward assessment/ recommendations to clients provider.   PLAN:  RNCM will follow up with client within 1 week.   Quinn Plowman RN,BSN,CCM [facility] Management [phone]

## 2019-04-26 NOTE — Addendum Note (Signed)
Addended by: Anabel Halon on: 04/26/2019 05:41 PM   Modules accepted: Orders

## 2019-04-26 NOTE — Telephone Encounter (Signed)
Spoke with Alexis [name] and she stated that she did not get patient in until next Wednesday due to Dr. Doren Custard not being in the office until then.  Patient has a complicated history.  She will call the home health Dr who called this morning to get more insight on patient issue since we saw her last on 2/22.  Number given for the home health doctor.  Hopefully this may get her in sooner.

## 2019-04-26 NOTE — Telephone Encounter (Signed)
Called and spoke with patient.  Just FYI.  She has an endoscopy scheduled for next Tuesday and has to get a covid test for tomorrow 1pm.  So after she get tested she will not be able to go out.  She is willing to try and go to an appointment in the morning before this appointment.    Per Percell Miller schedule vascular appointment.

## 2019-04-26 NOTE — Telephone Encounter (Signed)
I called that number attached to Dr. Rodman Key note. No answer rang and rang. No opportunity to leave message. Culture was from serosanginous discharge at base of toe and other area open wound on side of her foot. Not only from surface of skin as MD thought. If anyone calls back plan is trying to get in with Vascular tomorrow.   If not able to see vascular then recommend ED evaluation as approaching weekend and expressed concern from staff about verge of gangrene.  MA did call back [facility] and we attempted to call them again.  Plan will be to advise pt to go to ED if not able to get in with vascular surgeon tomorrow.

## 2019-04-26 NOTE — Telephone Encounter (Signed)
Rx dicloxacillin sent to pt pharmacy.

## 2019-04-26 NOTE — Chronic Care Management (AMB) (Signed)
Chronic Care Management Pharmacy  Name: Alexis [name]  MRN: [id number] DOB: 09-16-1950  Chief Complaint/ HPI  Alexis [name],  69 y.o. , female presents for their Initial CCM visit with the clinical pharmacist via telephone due to COVID-19 Pandemic.  PCP : Alexis Pai, PA-C  Their chronic conditions include: DM, CHF, PVD, CHF, AFib, HTN, Hypothyroid, Osteoarthritis, Gout, HLD  Office Visits: 04/23/19: Office visit w/ Alexis Pai, PA-C - Foot infection (rt medial heel wound). Being seen by podiatrist and home health care nurse. Podiatrist performed debridement 1 month prior per pt. Pt would like 2nd opinion. Wound culture of foot. Prescribed clindamycin   04/12/19: Office visit w/ Alexis Pai, PA-C - Acute right ankle pain x 2 weeks. Pt wearing boot advised by podiatry. Has wound to lower extremity/heel. Pt hx of PVD, DM, toe amputation, neuropathy. Tried tramadol night prior and it helped. Rt 2nd toe small red area and small bruise at tip. Tramadol for ankle pain. Doxycycline for skin infection. Follow up in 10 days or PRN.   04/03/19: Office visit w/ Alexis Pai, PA-C - UTI prescribed cipro and recommended to hydrate well. Referred to wound center. Tramadol for neuropathy  01/05/19: Office visit w/ Alexis Pai, PA-C - Pt feels better after D/C cymbalta. Pt had EGD and dx w/ esophageal varices, portal htn, and iron deficiency anemia. Pt set up for endoscopic band ligation. Pt has difficulty swallowing. Pt has chronic left lower abdomen shallow wound with mild yellow discharge for one year. Went to wound care in [place], but wound never healed  Consult Visit: 04/19/19: Cardio virtural visit w/ Dr. Agustin Cree - Permanent Afib. No anticoag due to comorbidity namely cirrhosis of liver and varicosis of esophagus. Rate controlled. No symptoms. PVD followed by Dr. Rachelle Hora. Neuropathic pain followed by neuro. Pacemaker present. Last interrogation was 12/2018.  03/28/19: Podiatry office  visit w/ Dr. Aida Puffer - Cutaneous ulcer. Plan for wound care at home. Weight dispersion pads applied to plantar medial aspect of R heel. Debrided with rotary bur the left first digit nail plate. Debrided the partial thickness left heel ulceration. Encouraged to seek counsel of peripheral vascular disease specialist.   01/15/19: Office visit w/ Dr. Lyndel Safe - Cirrhosis of liver without ascites. Increase aciphex to 77m BID (pt later did not tolerate), continue lasix 625mqd, increase spironolactone to 5087maily, Labs ordered (autoimmune hepatitis panel), if hepA and hepB antibodies negative. Vaccination recommended.   01/08/19: CarAnsonfice visit w/ [name] - CHF Echo 10/02/18 LVEF 40%. Off of anticoag since 07/2018 due to thrombocytopenia. No med changes noted.    Medications: Outpatient Encounter Medications as of 04/26/2019  Medication Sig  . allopurinol (ZYLOPRIM) 100 MG tablet TAKE ONE TABLET BY MOUTH TWICE DAILY  . AMBULATORY NON FORMULARY MEDICATION Motorized scooter.  Diagnosis: Osteoarthritis M19.90  . azelastine (ASTELIN) 0.1 % nasal spray Place 2 sprays into both nostrils 2 (two) times daily. Use in each nostril as directed  . Continuous Blood Gluc Sensor (FREESTYLE LIBRE 14 Vonne Mcdanel SENSOR) MISC   . fluticasone (FLONASE) 50 MCG/ACT nasal spray Place 2 sprays into both nostrils at bedtime.  . furosemide (LASIX) 20 MG tablet Take 2 tables in the morning and 1 tablet at noon  . levocetirizine (XYZAL) 5 MG tablet Take 5 mg by mouth at bedtime.  . Levothyroxine Sodium 150 MCG CAPS Take 1 capsule (150 mcg total) by mouth daily before breakfast.  . Lidocaine HCl 4 % LIQD Apply 10 mLs topically 2 (two) times  daily.  . losartan (COZAAR) 25 MG tablet TAKE ONE (1) TABLET BY MOUTH EVERY [name]  . metoprolol succinate (TOPROL-XL) 25 MG 24 hr tablet TAKE ONE (1) TABLET BY MOUTH EACH [name]  . mupirocin ointment (BACTROBAN) 2 % Apply to area left groin area twice daily.  Marland Kitchen NOVOLOG 100 UNIT/ML injection  20-25 units daily  . ondansetron (ZOFRAN-ODT) 4 MG disintegrating tablet Take 4 mg by mouth every 8 (eight) hours as needed.  . Prenatal Vit-Fe Fumarate-FA (MULTIVITAMIN-PRENATAL) 27-0.8 MG TABS tablet Take 1 tablet by mouth daily. Takes for nail and hair growth   . psyllium (METAMUCIL SMOOTH TEXTURE) 58.6 % powder Take 1 packet by mouth daily.  Marland Kitchen spironolactone (ALDACTONE) 50 MG tablet Take 1 tablet (50 mg total) by mouth daily.  . traMADol (ULTRAM) 50 MG tablet 1 tab po q 8 hrs prn pain  . [DISCONTINUED] clindamycin (CLEOCIN) 150 MG capsule Take 1 capsule (150 mg total) by mouth 3 (three) times daily.  . [DISCONTINUED] doxycycline (VIBRA-TABS) 100 MG tablet Take 1 tablet (100 mg total) by mouth 2 (two) times daily.   No facility-administered encounter medications on file as of 04/26/2019.   Immunization History  Administered Date(s) Administered  . Fluad Quad(high Dose 65+) 11/08/2018  . Hepatitis B, adult 01/31/2019  . Hepatitis B, ped/adol 03/19/2019, 03/19/2019  . Influenza Split 01/05/2007, 12/23/2010, 01/03/2012, 05/11/2012  . Influenza, High Dose Seasonal PF 01/09/2015, 12/24/2015, 01/14/2017, 12/12/2017  . Influenza,inj,Quad PF,6+ Mos 12/24/2013, 01/09/2015  . Influenza-Unspecified 01/05/2007, 12/23/2010, 01/03/2012, 05/11/2012, 01/09/2015  . Pneumococcal Conjugate-13 01/09/2015  . Pneumococcal Polysaccharide-23 05/11/2012  . Td 12/23/2010  . Tdap 12/23/2010     Current Diagnosis/Assessment:  Goals Addressed            This Visit's Progress   . A1c goal <7%      . Get set up with V-go or have basal insulin started      . Keep appointment with Alexis [name] on 04/30/19       Controlling your blood sugars is very important. Please make it to this appointment.    . Pharmacy Care Plan       Current Barriers:  . Chronic Disease Management support, education, and care coordination needs related to DM, CHF, PVD, CHF, AFib, HTN, Hypothyroid, Osteoarthritis, Gout, HLD  Pharmacist  Clinical Goal(s):  Marland Kitchen A1c goal <7%  Interventions: . Patient scheduled with Endocrinology for 04/30/19 . Patient to get set up with V-go or basal insulin regimen . Initial visit rescheduled for completion  Patient Self Care Activities:  . Patient verbalizes understanding of plan to follow as described above, Self administers medications as prescribed, Calls pharmacy for medication refills, and Calls provider office for new concerns or questions  Initial goal documentation       Patient is very confused about medication regimen.  Significant time spent addressing DM as that is the most pressing issue for the patient noting her diabetic foot infections and a1c >/= 8%.    Diabetes   Recent Relevant Labs: Lab Results  Component Value Date/Time   HGBA1C 9.0 04/03/2015 12:00 AM   MICROALBUR 0.3 04/10/2015 10:40 AM    04/26/19: a1c = 8.0% (per patient from [place] drawn by home health nurse)  A1c goal <7%   Insurance company stopped paying for V-go for about a year ago Scientist, clinical (histocompatibility and immunogenetics)) per patient. Patient thinks that her current insurance may now cover it.  Patient admits that she would need re-education on how to use the V-go. I am agreeable  to help patient with this as her BG control is vital to aiding in the healing of her diabetic foot wounds/possible future toe amputations.   Pt followed by Endo. Alexis [name] Jones PA-C. Next appt 05/04/19 (pt plans to cancel due to having EGD prior)  She hates sticking herself and would prefer to get V-go versus adding basal insulin to regimen.  Pt states when she uses Novolog it will go "too low" Pt would benefit from having basal insulin added to her regimen either through addition of an additional basal insulin such as Lantus or obtaining a continuous insulin delivery device such as the V-go that can provide basal insulin throughout the [place].   Got disconnected from patient during visit, called back and left VM for patient to return my call, then called Alexis [name]  Jone's office to coordinate care to get patient prescription for V-go. Spoke to [employer] nurse, Caren Griffins. Informed Caren Griffins that patient planned to cancel 3/5 appt and Caren Griffins offered to have patient seen by Alexis [name] on 04/30/19 prior to her EGD scheduled for 05/01/19. Caren Griffins to call patient back to confirm appt time to see Alexis [name] and request that patient call me back to complete our appt.   Called pt back. She is not feeling well and would like to complete visit at future time. Encouraged patient to keep appt on 04/30/19 with Alexis [name] to get her set up with V-go. Caren Griffins also mentioned that the diabetic educator Jan can teach patient how to use V-go. This would be greatly beneficial for patient.  Patient is currently uncontrolled on the following medications: Only bolus insulin of Novolog per sliding scale  Novolog Sliding Scale 70-90 - 10 units 90-130 - 16 units 130-150 - 18 units 151-200 - 20 units 201-250 - 22 units 251 -300 - 24 units  301-350 - 26 units  Last diabetic Eye exam:  Lab Results  Component Value Date/Time   HMDIABEYEEXA No Retinopathy 04/13/2017 12:00 AM    Last diabetic Foot exam: No results found for: HMDIABFOOTEX   We discussed: Importance of having a basal/bolus regimen or getting set up with V-go insulin pump to gain better DM control  Plan -Follow up with Alexis [name] on 04/30/19 to get set up with V-go  Heart Failure   Did not fully assess with patient. Information was gathered upon chart review  Type: Combined Systolic and Diastolic  Last ejection fraction: 09/2018 40%  Patient has failed these meds in past: ramipril (low bp, renal function fluctuation?) Patient is currently on the following medications: furosemide 82m 2AM and 1 PM, losartan 244mdaily, metoprolol succinate 2544maily, spironolactone 72m17mily   Hypertension   Did not fully assess with patient. Information was gathered upon chart review  Office blood pressures are  BP Readings from Last 3  Encounters:  04/19/19 125/83  04/12/19 137/60  01/15/19 122/70    Patient has failed these meds in the past: ramipril (low bp, renal function fluctuation?)  Patient is currently on the following medications: metoprolol 25mg35mly    Hyperlipidemia   Did not fully assess with patient. Information was gathered upon chart review  Lipid Panel     Component Value Date/Time   CHOL 93 (L) 05/10/2018 0944   TRIG 147 05/10/2018 0944   HDL 31 (L) 05/10/2018 0944   CHOLHDL 3.0 05/10/2018 0944   CHOLHDL 6 04/10/2015 1040   VLDL 50.8 (H) 04/10/2015 1040   LDLCALC 33 05/10/2018 0944   LDLDIRECT 111.0 04/10/2015 1040   LABVLDL 29 05/10/2018 0944  ASCVD 10-year risk: Unable to calculate due to LDL <70   Patient has failed these meds in past: atorvastatin?  Patient is currently on the following medications: None   AFIB   Did not fully assess with patient. Information was gathered upon chart review  Patient is currently rate controlled. Has pacemaker  Patient has failed these meds in past: None noted Patient is currently on the following medications: metoprolol succinate 59m daily  Hypothyroidism   Did not fully assess with patient. Information was gathered upon chart review  03/15/17: TSH = 2.09  Patient has failed these meds in past: None noted Patient is currently on the following medications: levothyroxine 1559m daily  Plan -Get TSH drawn at next office visit  Osteoarthritis    Did not fully assess with patient. Information was gathered upon chart review  Patient has failed these meds in past: None noted Patient is currently on the following medications: tramadol 5014m8H PRN  Gout   Did not fully assess with patient. Information was gathered upon chart review  Patient has failed these meds in past: None noted Patient is currently on the following medications: allopurinol 100m28mD  Allergic Rhinitis    Did not fully assess with patient. Information was  gathered upon chart review  Patient has failed these meds in past: None noted Patient is currently on the following medications: fluticasone 50mc3msprays EN HS, levocetirizine 5mg d68my, azelastine 0.1% 2 sprays EN BID  Miscellaneous Meds Prenatal vitamin Metamucil Ondansetron Mupirocin  lidocaine dicloxacillin

## 2019-04-26 NOTE — Patient Instructions (Signed)
Visit Information  Goals Addressed            This Visit's Progress   . A1c goal <7%      . Get set up with V-go or have basal insulin started      . Keep appointment with Autumn on 04/30/19       Controlling your blood sugars is very important. Please make it to this appointment.    . Pharmacy Care Plan       Current Barriers:  . Chronic Disease Management support, education, and care coordination needs related to DM, CHF, PVD, CHF, AFib, HTN, Hypothyroid, Osteoarthritis, Gout, HLD  Pharmacist Clinical Goal(s):  Marland Kitchen A1c goal <7%  Interventions: . Patient scheduled with Endocrinology for 04/30/19 . Patient to get set up with V-go or basal insulin regimen . Initial visit rescheduled for completion  Patient Self Care Activities:  . Patient verbalizes understanding of plan to follow as described above, Self administers medications as prescribed, Calls pharmacy for medication refills, and Calls provider office for new concerns or questions  Initial goal documentation        Ms. Freundlich was given information about Chronic Care Management services today including:  1. CCM service includes personalized support from designated clinical staff supervised by her physician, including individualized plan of care and coordination with other care providers 2. 24/7 contact phone numbers for assistance for urgent and routine care needs. 3. Service will only be billed when office clinical staff spend 20 minutes or more in a month to coordinate care. 4. Only one practitioner may furnish and bill the service in a calendar month. 5. The patient may stop CCM services at any time (effective at the end of the month) by phone call to the office staff. 6. The patient will be responsible for cost sharing (co-pay) of up to 20% of the service fee (after annual deductible is met).  Patient agreed to services and verbal consent obtained.   The patient verbalized understanding of instructions provided today  and agreed to receive a mailed copy of patient instruction and/or educational materials. Telephone follow up appointment with pharmacy team member scheduled for: 05/14/19  Melvenia Beam Kylii Ennis, PharmD Clinical Pharmacist [facility] [phone]   Diabetes Action Plan Following a diabetes action plan is a way for you to manage your diabetes (diabetes mellitus) symptoms. The plan is color-coded to help you understand what actions you need to take based on any symptoms you are having.  If you have symptoms in the red zone, you need medical care right away.  If you have symptoms in the yellow zone, it means you are having problems.  If you have symptoms in the green zone, you are doing well. Learning about and understanding diabetes can take time. Follow the plan that you develop with your health care provider. Know the target range for your blood sugar (glucose) level, and review your treatment plan with your health care provider at each visit. The target range for my blood sugar level is 150 mg/dL. Red zone Get medical help right away if you have any of the following symptoms:  A blood sugar test result that is below 54 mg/dL (3 mmol/L).  A blood sugar test result that is at or above 240 mg/dL (13.3 mmol/L) for 2 days in a row.  Confusion or trouble thinking clearly.  Difficulty breathing.  Sickness or a fever for 2 or more days that is not getting better.  Moderate or large  ketone levels in your urine. If you have any red zone symptoms, call emergency services (911 in the [place] or go to the nearest emergency room. If you have severely low blood sugar (severe hypoglycemia) and you cannot eat or drink, you may need an injection of glucagon. Make sure a family member or close friend knows how to check your blood sugar and how to give you a glucagon injection. You may need to be treated in a hospital for this condition. Yellow zone If you have any of the  following symptoms, your diabetes is not under control and you may need to make some changes:  Blood sugar test results that are below 70 mg/dL (3.9 mmol/L).  Other symptoms of hypoglycemia, such as: ? Shaking or feeling light-headed. ? Confusion or irritability. ? Feeling hungry. ? Having a fast heartbeat.  A blood sugar test result that is higher than 240 mg/dL (13.3 mmol/L) for 2 days in a row.  A fever.  Feeling tired, or not having any energy. If you have any yellow zone symptoms:  Treat your low blood sugar (hypoglycemia) by eating or drinking 15 grams of a rapid-acting carbohydrate. Follow the 15:15 rule: ? Take 15 grams of a rapid-acting carbohydrate, such as:  1 tube of glucose gel.  3 glucose pills.  6-8 pieces of hard candy.  4 oz (120 mL) of fruit juice.  4 oz (120 mL) of regular (not diet) soda. ? Check your blood sugar 15 minutes after you take the carbohydrate. ? If the repeat blood sugar test is still at or below 70 mg/dL (3.9 mmol/L), take 15 grams of a carbohydrate again. ? If your blood sugar does not increase above 70 mg/dL (3.9 mmol/L) after 3 tries, get medical help right away. ? After your blood sugar returns to normal, eat a meal or a snack within 1 hour.  Keep taking your daily medicines as directed.  Check your blood sugar more often than you normally would. ? Write down your results. ? Call your health care provider if you have trouble keeping your blood sugar in your target range.  Green zone These signs mean you are doing well and you can continue what you are doing to manage your diabetes:  Your blood sugar is within your personal target range. For most people, a blood sugar level before a meal (preprandial) should be 80-130 mg/dL.  You feel well, and you are able to do daily activities. If you are in the green zone, continue to manage your diabetes as directed. To do this:  Eat a healthy diet.  Exercise regularly.  Check your blood  sugar as directed.  Take your medicines as directed.  Where to find more information You can find more information about diabetes from:  American Diabetes Association (ADA): www.diabetes.org  American Association of Diabetes Educators (AADE): www.diabeteseducator.org Summary  Following a diabetes action plan is a way for you to manage your diabetes symptoms. The plan is color-coded to help you understand what actions you need to take based on any symptoms you are having.  Follow the plan that you develop with your health care provider. Make sure you know your personal target blood sugar level.  Review your treatment plan with your health care provider at each visit. This information is not intended to replace advice given to you by your health care provider. Make sure you discuss any questions you have with your health care provider. Document Revised: 04/15/2017 Document Reviewed: 12/08/2016 Elsevier Patient Education  [place].

## 2019-04-26 NOTE — Telephone Encounter (Signed)
error 

## 2019-04-26 NOTE — Telephone Encounter (Signed)
Jennifer from the Heart and Vascular department called to speak to [facility] or the nurse about a stat referral for this patient. Please call Anderson Malta back as soon as possible at [phone]   Thanks,

## 2019-04-26 NOTE — Telephone Encounter (Signed)
Dr. Liston Alba with [facility] would like for you to give her a call back as soon as you can about this patient.  She just left from seeing the patient and stated that her leg is on the verge of gang green.  She states that patient has not been taking the clindamyacin she has been vomiting.  She asked if she the wound culture we did was done in the tissue or on the surface.  She feels that it is not an infection and that no antibiotics will help her. She feels like she does not need to to ER  She gave me the office number to call her [phone]

## 2019-04-27 ENCOUNTER — Ambulatory Visit: Payer: HMO | Admitting: Medical

## 2019-04-27 ENCOUNTER — Encounter: Payer: Self-pay | Admitting: Medical

## 2019-04-27 ENCOUNTER — Other Ambulatory Visit (HOSPITAL_COMMUNITY)
Admission: RE | Admit: 2019-04-27 | Discharge: 2019-04-27 | Disposition: A | Discharge status: Home / Self Care | Payer: HMO | Source: Ambulatory Visit | Attending: Gastroenterology | Admitting: Gastroenterology

## 2019-04-27 DIAGNOSIS — Z20822 Contact with and (suspected) exposure to covid-19: Secondary | ICD-10-CM | POA: Insufficient documentation

## 2019-04-27 DIAGNOSIS — Z01812 Encounter for preprocedural laboratory examination: Secondary | ICD-10-CM | POA: Diagnosis not present

## 2019-04-27 LAB — SARS CORONAVIRUS 2 (TAT 6-24 HRS): SARS Coronavirus 2: NEGATIVE

## 2019-04-30 ENCOUNTER — Encounter: Payer: Self-pay | Admitting: Medical

## 2019-04-30 ENCOUNTER — Other Ambulatory Visit: Payer: Self-pay

## 2019-04-30 ENCOUNTER — Telehealth: Payer: Self-pay | Admitting: Medical

## 2019-04-30 ENCOUNTER — Ambulatory Visit (INDEPENDENT_AMBULATORY_CARE_PROVIDER_SITE_OTHER): Payer: HMO | Admitting: Medical

## 2019-04-30 ENCOUNTER — Telehealth: Payer: Self-pay | Admitting: Gastroenterology

## 2019-04-30 VITALS — BP 170/64 | Wt 195.0 lb

## 2019-04-30 DIAGNOSIS — T148XXD Other injury of unspecified body region, subsequent encounter: Secondary | ICD-10-CM

## 2019-04-30 DIAGNOSIS — R269 Unspecified abnormalities of gait and mobility: Secondary | ICD-10-CM

## 2019-04-30 DIAGNOSIS — I739 Peripheral vascular disease, unspecified: Secondary | ICD-10-CM | POA: Diagnosis not present

## 2019-04-30 DIAGNOSIS — E1142 Type 2 diabetes mellitus with diabetic polyneuropathy: Secondary | ICD-10-CM | POA: Diagnosis not present

## 2019-04-30 DIAGNOSIS — F329 Major depressive disorder, single episode, unspecified: Secondary | ICD-10-CM

## 2019-04-30 DIAGNOSIS — L97519 Non-pressure chronic ulcer of other part of right foot with unspecified severity: Secondary | ICD-10-CM

## 2019-04-30 DIAGNOSIS — J4520 Mild intermittent asthma, uncomplicated: Secondary | ICD-10-CM

## 2019-04-30 DIAGNOSIS — L089 Local infection of the skin and subcutaneous tissue, unspecified: Secondary | ICD-10-CM

## 2019-04-30 DIAGNOSIS — I10 Essential (primary) hypertension: Secondary | ICD-10-CM | POA: Diagnosis not present

## 2019-04-30 DIAGNOSIS — E11621 Type 2 diabetes mellitus with foot ulcer: Secondary | ICD-10-CM

## 2019-04-30 DIAGNOSIS — Z794 Long term (current) use of insulin: Secondary | ICD-10-CM | POA: Diagnosis not present

## 2019-04-30 DIAGNOSIS — F32A Depression, unspecified: Secondary | ICD-10-CM

## 2019-04-30 DIAGNOSIS — J449 Chronic obstructive pulmonary disease, unspecified: Secondary | ICD-10-CM

## 2019-04-30 NOTE — Telephone Encounter (Signed)
Called and spoke with patient-patient had additional questions regarding her procedure at [facility]- all questions answered;   Patient advised to call back to the office at [phone] should questions/concerns arise;  Patient verbalized understanding of information/instructions;

## 2019-04-30 NOTE — Telephone Encounter (Signed)
Patient also requested a nebulizer as well.  Both ordered and faxed over to [facility].

## 2019-04-30 NOTE — Anesthesia Preprocedure Evaluation (Addendum)
Anesthesia Evaluation  Patient identified by MRN, date of birth, ID band Patient awake    Reviewed: Allergy & Precautions, NPO status , Patient's Chart, lab work & pertinent test results  History of Anesthesia Complications (+) PONV and history of anesthetic complications  Airway Mallampati: II  TM Distance: >3 FB Neck ROM: Full    Dental no notable dental hx. (+) Upper Dentures, Dental Advisory Given   Pulmonary COPD,  COPD inhaler, former smoker,    Pulmonary exam normal breath sounds clear to auscultation       Cardiovascular hypertension, Pt. on medications and Pt. on home beta blockers + Past MI and + Peripheral Vascular Disease  Normal cardiovascular exam+ pacemaker  Rhythm:Irregular Rate:Abnormal  afib   Neuro/Psych negative psych ROS   GI/Hepatic   Endo/Other  diabetes, Type 1Hypothyroidism   Renal/GU      Musculoskeletal   Abdominal (+) + obese,   Peds  Hematology   Anesthesia Other Findings   Reproductive/Obstetrics                            Anesthesia Physical Anesthesia Plan  ASA: IV  Anesthesia Plan: MAC   Post-op Pain Management:    Induction: Intravenous  PONV Risk Score and Plan: Treatment may vary due to age or medical condition  Airway Management Planned: Natural Airway and Nasal Cannula  Additional Equipment:   Intra-op Plan:   Post-operative Plan:   Informed Consent: I have reviewed the patients History and Physical, chart, labs and discussed the procedure including the risks, benefits and alternatives for the proposed anesthesia with the patient or authorized representative who has indicated his/her understanding and acceptance.     Dental advisory given  Plan Discussed with:   Anesthesia Plan Comments: (EGD for cough Anemia Abd pain)       Anesthesia Quick Evaluation

## 2019-04-30 NOTE — Progress Notes (Signed)
   Subjective:    Patient ID: Alexis [name], female    DOB: [birth year], 69 y.o.   MRN: [id number]  HPI  Virtual Visit via Video Note  I connected with Alexis [name] on 04/30/19 at 11:20 AM EST by a video enabled telemedicine application and verified that I am speaking with the correct person using two identifiers.  Location: Patient: home Provider: office   I discussed the limitations of evaluation and management by telemedicine and the availability of in person appointments. The patient expressed understanding and agreed to proceed.  Pt had bp earlier at endocrinlogist. Will check again and update me by my chart.  History of Present Illness:  Pt has virtual follow up.   Pt has some rt lower ext foot wounds for which podiatrist has been working on pt. She has appointment with vascular surgeon office.   She has hx of diabetes, pvd and toe amputations.   Pt had growth of staph on culture of toe. Results showed staph. Resistant to clindamycin. Pt states gi side effects/vomiting with clidamycin.  Pt states neurologist considered prescribing prestiq  for neuropathy. Ths may also help with pt recent depression. Increased since foot concerns. NP and MD concerned she may loose toe. I had conversation with MD who expressed    Observations/Objective: General-no acute distress, pleasant, oriented. Lungs- on inspection lungs appear unlabored. Neck- no tracheal deviation or jvd on inspection. Neuro- gross motor function appears intact. Rt foot- base of rt great toe dark black area now about size of thumb pad. Before on lat in office check was pin point. Dark area with seronginous. Pt has moderate heel ulcer with dried yellow appearance in wound.    Assessment and Plan:  For you pvd, diabetic ulcer, delayed healing and + wound culture continue on dicloxacillin.   Keep vascular appointment on 05-10-2019. Also try to get in with new podiatrist. But vascular surgeon may consult with  old podiatrist.  For neuropathy and depression, touch base with neurologist since they were considering using pristiq.  Follow up date with me to be determined after vascular appointment. I will review that note.  If you have severe worsening changes to foot then recommend ED evuluation.     Mackie Pai, PA-C    Follow Up Instructions:    I discussed the assessment and treatment plan with the patient. The patient was provided an opportunity to ask questions and all were answered. The patient agreed with the plan and demonstrated an understanding of the instructions.   The patient was advised to call back or seek an in-person evaluation if the symptoms worsen or if the condition fails to improve as anticipated.  I provided 30 minutes of non-face-to-face time during this encounter.   Mackie Pai, PA-C    Review of Systems  Constitutional: Negative for chills, fatigue and fever.  Respiratory: Negative for cough, chest tightness, shortness of breath and wheezing.   Cardiovascular: Negative for chest pain and palpitations.  Gastrointestinal: Negative for abdominal pain.  Musculoskeletal: Negative for back pain.  Skin: Negative for rash.  Neurological: Negative for dizziness, seizures and headaches.  Hematological: Negative for adenopathy. Does not bruise/bleed easily.  Psychiatric/Behavioral: Negative for behavioral problems and confusion.       Objective:   Physical Exam        Assessment & Plan:

## 2019-04-30 NOTE — Patient Instructions (Addendum)
For you pvd, diabetic ulcer, delayed healing and + wound culture continue on dicloxacillin.   Keep vascular appointment on 05-10-2019. Also try to get in with new podiatrist. But vascular surgeon may consult with old podiatrist.  For neuropathy and depression, touch base with neurologist since they were considering using pristiq.  Follow up date with me to be determined after vascular appointment. I will review that note when sent to me.  If you have severe worsening changes to foot then recommend ED evuluation.  For gait disturbance and non working go Therapist, music. Will rx standard light weight manual wheel chair.  Patient does have mobility limitations which cannot be resolved with cane, crutch or walker.  She can safely self propel with standard wheelchair and does have a caregiver who can assist her at all times.

## 2019-04-30 NOTE — Addendum Note (Signed)
Addended by: Kem Boroughs D on: 04/30/2019 04:43 PM   Modules accepted: Orders

## 2019-04-30 NOTE — Telephone Encounter (Signed)
I included statement regarding wheelchair use in the note.  We will go ahead and send that prescription to her pharmacy.

## 2019-04-30 NOTE — Telephone Encounter (Signed)
Patient is requesting a nebulizer machine. Patient states she needs a new one.

## 2019-05-01 ENCOUNTER — Ambulatory Visit (HOSPITAL_COMMUNITY): Payer: HMO | Admitting: Anesthesiology

## 2019-05-01 ENCOUNTER — Ambulatory Visit (HOSPITAL_COMMUNITY)
Admission: RE | Admit: 2019-05-01 | Discharge: 2019-05-01 | Disposition: A | Discharge status: Home / Self Care | Payer: HMO | Attending: Gastroenterology | Admitting: Gastroenterology

## 2019-05-01 ENCOUNTER — Telehealth (HOSPITAL_COMMUNITY): Payer: Self-pay

## 2019-05-01 ENCOUNTER — Encounter: Payer: Self-pay | Admitting: Medical

## 2019-05-01 ENCOUNTER — Encounter (HOSPITAL_COMMUNITY): Payer: Self-pay | Admitting: Gastroenterology

## 2019-05-01 ENCOUNTER — Other Ambulatory Visit: Payer: Self-pay | Admitting: *Deleted

## 2019-05-01 ENCOUNTER — Other Ambulatory Visit: Payer: Self-pay

## 2019-05-01 ENCOUNTER — Encounter (HOSPITAL_COMMUNITY)
Admission: RE | Disposition: A | Discharge status: Home / Self Care | Payer: Self-pay | Source: Home / Self Care | Attending: Gastroenterology

## 2019-05-01 DIAGNOSIS — Z6834 Body mass index (BMI) 34.0-34.9, adult: Secondary | ICD-10-CM | POA: Insufficient documentation

## 2019-05-01 DIAGNOSIS — I8510 Secondary esophageal varices without bleeding: Secondary | ICD-10-CM | POA: Diagnosis not present

## 2019-05-01 DIAGNOSIS — J449 Chronic obstructive pulmonary disease, unspecified: Secondary | ICD-10-CM | POA: Diagnosis not present

## 2019-05-01 DIAGNOSIS — Z8249 Family history of ischemic heart disease and other diseases of the circulatory system: Secondary | ICD-10-CM | POA: Diagnosis not present

## 2019-05-01 DIAGNOSIS — E1051 Type 1 diabetes mellitus with diabetic peripheral angiopathy without gangrene: Secondary | ICD-10-CM | POA: Diagnosis not present

## 2019-05-01 DIAGNOSIS — K3189 Other diseases of stomach and duodenum: Secondary | ICD-10-CM | POA: Diagnosis not present

## 2019-05-01 DIAGNOSIS — I2510 Atherosclerotic heart disease of native coronary artery without angina pectoris: Secondary | ICD-10-CM | POA: Insufficient documentation

## 2019-05-01 DIAGNOSIS — K7460 Unspecified cirrhosis of liver: Secondary | ICD-10-CM | POA: Insufficient documentation

## 2019-05-01 DIAGNOSIS — D500 Iron deficiency anemia secondary to blood loss (chronic): Secondary | ICD-10-CM

## 2019-05-01 DIAGNOSIS — R1110 Vomiting, unspecified: Secondary | ICD-10-CM

## 2019-05-01 DIAGNOSIS — Z882 Allergy status to sulfonamides status: Secondary | ICD-10-CM | POA: Insufficient documentation

## 2019-05-01 DIAGNOSIS — Z950 Presence of cardiac pacemaker: Secondary | ICD-10-CM | POA: Diagnosis not present

## 2019-05-01 DIAGNOSIS — R059 Cough, unspecified: Secondary | ICD-10-CM

## 2019-05-01 DIAGNOSIS — I739 Peripheral vascular disease, unspecified: Secondary | ICD-10-CM

## 2019-05-01 DIAGNOSIS — Z87891 Personal history of nicotine dependence: Secondary | ICD-10-CM | POA: Diagnosis not present

## 2019-05-01 DIAGNOSIS — K766 Portal hypertension: Secondary | ICD-10-CM

## 2019-05-01 DIAGNOSIS — I252 Old myocardial infarction: Secondary | ICD-10-CM | POA: Insufficient documentation

## 2019-05-01 DIAGNOSIS — Z833 Family history of diabetes mellitus: Secondary | ICD-10-CM | POA: Diagnosis not present

## 2019-05-01 DIAGNOSIS — E669 Obesity, unspecified: Secondary | ICD-10-CM | POA: Diagnosis not present

## 2019-05-01 DIAGNOSIS — R269 Unspecified abnormalities of gait and mobility: Secondary | ICD-10-CM | POA: Diagnosis not present

## 2019-05-01 DIAGNOSIS — K7581 Nonalcoholic steatohepatitis (NASH): Secondary | ICD-10-CM | POA: Diagnosis not present

## 2019-05-01 DIAGNOSIS — I110 Hypertensive heart disease with heart failure: Secondary | ICD-10-CM | POA: Insufficient documentation

## 2019-05-01 DIAGNOSIS — M1990 Unspecified osteoarthritis, unspecified site: Secondary | ICD-10-CM | POA: Diagnosis not present

## 2019-05-01 DIAGNOSIS — Z885 Allergy status to narcotic agent status: Secondary | ICD-10-CM | POA: Diagnosis not present

## 2019-05-01 DIAGNOSIS — K5900 Constipation, unspecified: Secondary | ICD-10-CM | POA: Insufficient documentation

## 2019-05-01 DIAGNOSIS — Z89421 Acquired absence of other right toe(s): Secondary | ICD-10-CM | POA: Diagnosis not present

## 2019-05-01 DIAGNOSIS — R05 Cough: Secondary | ICD-10-CM

## 2019-05-01 DIAGNOSIS — I509 Heart failure, unspecified: Secondary | ICD-10-CM | POA: Insufficient documentation

## 2019-05-01 DIAGNOSIS — K219 Gastro-esophageal reflux disease without esophagitis: Secondary | ICD-10-CM | POA: Diagnosis not present

## 2019-05-01 DIAGNOSIS — I4891 Unspecified atrial fibrillation: Secondary | ICD-10-CM | POA: Insufficient documentation

## 2019-05-01 DIAGNOSIS — R1013 Epigastric pain: Secondary | ICD-10-CM

## 2019-05-01 DIAGNOSIS — I8500 Esophageal varices without bleeding: Secondary | ICD-10-CM

## 2019-05-01 DIAGNOSIS — R197 Diarrhea, unspecified: Secondary | ICD-10-CM

## 2019-05-01 HISTORY — PX: ESOPHAGOGASTRODUODENOSCOPY (EGD) WITH PROPOFOL: SHX5813

## 2019-05-01 HISTORY — PX: ESOPHAGEAL BANDING: SHX5518

## 2019-05-01 LAB — GLUCOSE, CAPILLARY: Glucose-Capillary: 156 mg/dL — ABNORMAL HIGH (ref 70–99)

## 2019-05-01 SURGERY — ESOPHAGOGASTRODUODENOSCOPY (EGD) WITH PROPOFOL
Anesthesia: Monitor Anesthesia Care

## 2019-05-01 MED ORDER — LIDOCAINE 2% (20 MG/ML) 5 ML SYRINGE
INTRAMUSCULAR | Status: DC | PRN
  Administered 2019-05-01: 60 mg via INTRAVENOUS

## 2019-05-01 MED ORDER — LACTATED RINGERS IV SOLN
INTRAVENOUS | Status: AC | PRN
  Administered 2019-05-01: 1000 mL via INTRAVENOUS

## 2019-05-01 MED ORDER — PROPOFOL 10 MG/ML IV BOLUS
INTRAVENOUS | Status: AC
  Filled 2019-05-01: qty 20

## 2019-05-01 MED ORDER — SODIUM CHLORIDE 0.9 % IV SOLN: INTRAVENOUS | Status: DC

## 2019-05-01 MED ORDER — PROPOFOL 10 MG/ML IV BOLUS
INTRAVENOUS | Status: DC | PRN
  Administered 2019-05-01: 30 mg via INTRAVENOUS

## 2019-05-01 MED ORDER — PROPOFOL 500 MG/50ML IV EMUL
INTRAVENOUS | Status: DC | PRN
  Administered 2019-05-01: 150 ug/kg/min via INTRAVENOUS

## 2019-05-01 MED ORDER — RABEPRAZOLE SODIUM 20 MG PO TBEC: 20.0000 mg | DELAYED_RELEASE_TABLET | Freq: Every day | ORAL | 1 refills | Status: DC

## 2019-05-01 SURGICAL SUPPLY — 15 items

## 2019-05-01 NOTE — Transfer of Care (Signed)
Immediate Anesthesia Transfer of Care Note  Patient: Alexis [name]  Procedure(s) Performed: ESOPHAGOGASTRODUODENOSCOPY (EGD) WITH PROPOFOL (N/A ) ESOPHAGEAL BANDING  Patient Location: PACU  Anesthesia Type:MAC  Level of Consciousness: awake, alert  and oriented  Airway & Oxygen Therapy: Patient Spontanous Breathing and Patient connected to face mask oxygen  Post-op Assessment: Report given to RN and Post -op Vital signs reviewed and stable  Post vital signs: Reviewed and stable  Last Vitals:  Vitals Value Taken Time  BP    Temp    Pulse 26 05/01/19 0908  Resp    SpO2 84 % 05/01/19 0908  Vitals shown include unvalidated device data.  Last Pain:  Vitals:   05/01/19 0759  TempSrc: Oral  PainSc: 0-No pain         Complications: No apparent anesthesia complications

## 2019-05-01 NOTE — Discharge Instructions (Signed)
Esophageal Variceal Ligation, Care After This sheet gives you information about how to care for yourself after your procedure. Your doctor may also give you more specific instructions. If you have problems or questions, contact your doctor. What can I expect after the procedure? After the procedure, it is common to have:  Bleeding.  Pain and soreness in your chest area.  Trouble swallowing. Follow these instructions at home:  Eating and drinking Follow instructions from your doctor about what you can eat or drink.  You will have limits on what you can eat for the first 1-2 days after your procedure.  You will start with a liquid diet. Later, you will start to eat soft foods.  Do not drink alcohol.  Activity  Return to your normal activities as told by your doctor. Ask your doctor what activities are safe for you.  Do not lift anything that is heavier than 10 lb (4.5 kg), or the limit that you are told, until your doctor says that it is safe.  Do not drive or use heavy machinery while taking prescription pain medicine. General instructions  Take over-the-counter and prescription medicines only as told by your doctor.  Do not use any products that contain nicotine or tobacco, such as cigarettes and e-cigarettes. If you need help quitting, ask your doctor.  Keep all follow-up visits as told by your doctor. This is important. Contact a doctor if:  You have chest pain that lasts for more than 3 days after you go home.  You have trouble swallowing that lasts for more than 3 days after you go home.  You have a fever or chills. Get help right away if:  You have bleeding from your throat.  You have bleeding from your bottom (rectum).  You throw up (vomit) bright red blood.  You are unable to swallow.  You are short of breath.  You have very bad chest pain or back pain. Summary  After the procedure, it is common to have pain, bleeding, and trouble swallowing.  Follow  all your home care instructions.  Stay on a liquid or soft diet until your doctor says that you can go back to your normal diet.  Contact a doctor if you have chills, fever, chest pain, or trouble swallowing.  Get help right away if you have bleeding, are unable to swallow, or have very bad chest or back pain. This information is not intended to replace advice given to you by your health care provider. Make sure you discuss any questions you have with your health care provider. Document Revised: 05/24/2017 Document Reviewed: 05/24/2017 Elsevier Patient Education  2020 [place] HAD AN ENDOSCOPIC PROCEDURE TODAY: Refer to the procedure report and other information in the discharge instructions given to you for any specific questions about what was found during the examination. If this information does not answer your questions, please call [place] office at [phone] to clarify.   YOU SHOULD EXPECT: Some feelings of bloating in the abdomen. Passage of more gas than usual. Walking can help get rid of the air that was put into your GI tract during the procedure and reduce the bloating. If you had a lower endoscopy (such as a colonoscopy or flexible sigmoidoscopy) you may notice spotting of blood in your stool or on the toilet paper. Some abdominal soreness may be present for a day or two, also.  DIET: Your first meal following the procedure should be a light meal and then it is ok to progress  to your normal diet. A half-sandwich or bowl of soup is an example of a good first meal. Heavy or fried foods are harder to digest and may make you feel nauseous or bloated. Drink plenty of fluids but you should avoid alcoholic beverages for 24 hours. If you had a esophageal dilation, please see attached instructions for diet.    ACTIVITY: Your care partner should take you home directly after the procedure. You should plan to take it easy, moving slowly for the rest of the day. You can resume normal  activity the day after the procedure however YOU SHOULD NOT DRIVE, use power tools, machinery or perform tasks that involve climbing or major physical exertion for 24 hours (because of the sedation medicines used during the test).   SYMPTOMS TO REPORT IMMEDIATELY: A gastroenterologist can be reached at any hour. Please call [phone]  for any of the following symptoms:  Following lower endoscopy (colonoscopy, flexible sigmoidoscopy) Excessive amounts of blood in the stool  Significant tenderness, worsening of abdominal pains  Swelling of the abdomen that is new, acute  Fever of 100 or higher  Following upper endoscopy (EGD, ) Vomiting of blood or coffee ground material  New, significant abdominal pain  New, significant chest pain or pain under the shoulder blades  Painful or persistently difficult swallowing  New shortness of breath  Black, tarry-looking or red, bloody stools  FOLLOW UP:  If any biopsies were taken you will be contacted by phone or by letter within the next 1-3 weeks. Call [phone]  if you have not heard about the biopsies in 3 weeks.  Please also call with any specific questions about appointments or follow up tests.

## 2019-05-01 NOTE — H&P (Signed)
Chief Complaint: FU  Referring Provider:  No ref. provider found      ASSESSMENT AND PLAN;   For EGD today with possible EVL This is updated history and physical and review of previous note Explained risks and benefits  RG        #1.  Liver cirrhosis d/t  R sided CHF, NASH. R/O other etiologies (no ETOH x 20 yrs). Dx on CT 08/2018, with portal hypertension in form of splenomegaly with hypersplenism, esophageal varices, pancytopenia, mild ascites. No definite clinical HE.  MELD Na 13 (11/2018)  #2. Esophageal varices (Gd III) s/p EVL 11/2018.   #3. GERD with small transient HH.  #4. Comorbid conditions include CHF, PVD, HTN, A. fib, HLD, PAD, DM 2, CAD, pacemaker and obesity.   Plan: - Increase Aciphex 67m po bid 60, 11 refiils. - [facility] - Check CBC, CMP, PT INR. - Continue lasix 648mpo qd - Increase aldactone 5015mo qd. #30, 11 refills. - Salt restricted diet. - Check autoimmune hepatitis panel (AMA, ASMA and anti-LKM), ANA, iron studies, serum ceruloplasmin, A1AT, celiac screen and GGT. - Check ammonia, alpha-fetoprotein and PT INR. - Check anti-HAV total Ab and HBsAb.  If negative, would recommend vaccination for hepatitis A and B. - Appt with [facility] (any afternoon appt)    HPI:    Alexis [name] a [age]o. female  With newly diagnosed liver cirrhosis on CT 08/2018  For FU.  Feels great since she has stopped Cymbalta.  Underwent EGD and colonoscopy December 25, 2018.  EGD showed grade 3 esophageal varices s/p EVL, portal hypertensive gastropathy.  Colonoscopy 11/2018 showed mild portal hypertensive colopathy, grade 4 internal hemorrhoids, mild pancolonic diverticulosis.  She had some problems with epigastric pain and dysphagia after endoscopic variceal ligation.  She took Jell-O for 2 to 3 days thereafter.  Currently better except for occasional epigastric discomfort, regurgitation and heartburn  despite AcipHex once a day.  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  After that she did not drink any alcohol.  She is alcohol free for last 20 years.  To me she denies having any history of hematochezia.  Has more constipation but is better on Metamucil.  No change in mental status.  Has history of easy bruisability.  No history of itching, skin lesions, easy bruisability, intake of over-the-counter medications including diet pills, herbal medications, anabolic steroids or Tylenol.  There is no history of blood transfusions, IV drug use or family history of liver disease.  No jaundice dark urine or pale stools.  Recently seen by cardiology and vascular surgery as well.  Has chronic mild shortness of breath.  No nonsteroidals.  Got wt 222 to 218lb.  Gained 2 pounds since yesterday.   2DE 09/2018: (Dr K) Raliegh Ip. The right ventricle has moderately reduced systolic function. The cavity was mildly enlarged. 2. Right atrial size was severely dilated. 3. Small pericardial effusion. 4. EF 40%  CT AP with contrast 09/28/2018: 1. Sigmoid diverticulosis. 2.  Stigmata of cirrhosis and splenomegaly (15.6 cm).  Trace ascites. 3. Gallstone near the gallbladder neck  Past Medical History:  Diagnosis Date  . Allergy   . Anxiety   . Arthritis   . Asthma   . Atrial fibrillation ([facility] . CHF (congestive heart failure) ([facility] . Cirrhosis ([facility] .  COPD (chronic obstructive pulmonary disease) ([place])   . Diabetes mellitus without complication ([place])   . Hyperlipidemia   . Hypertension   . Macrocytic anemia 09/04/2018  . Myocardial infarction ([place])    several  . Pacemaker    CRT-P with RV lead and His Bundle lead ( high threshold)   . Peripheral neuropathy   . Pneumonia   . PONV (postoperative nausea and vomiting)    unsure of exactly what happened at [facility] in 2010 (knee surgery) that led to crital care  . Thyroid  disease     Past Surgical History:  Procedure Laterality Date  . ABDOMINAL AORTOGRAM W/LOWER EXTREMITY N/A 04/29/2016   Procedure: Abdominal Aortogram w/Lower Extremity;  Surgeon: Waynetta Sandy, MD;  Location: [facility];  Service: Cardiovascular;  Laterality: N/A;  . ABDOMINAL AORTOGRAM W/LOWER EXTREMITY N/A 05/06/2017   Procedure: ABDOMINAL AORTOGRAM W/LOWER EXTREMITY;  Surgeon: Angelia Mould, MD;  Location: [place] [facility];  Service: Cardiovascular;  Laterality: N/A;  bilateral  . ABDOMINAL HYSTERECTOMY    . AMPUTATION Right 07/30/2016   Procedure: AMPUTATION RIGHT SECOND TOE;  Surgeon: Angelia Mould, MD;  Location: [facility];  Service: Vascular;  Laterality: Right;  . CARDIAC CATHETERIZATION     2005 at [place] N/A 12/25/2018   Procedure: COLONOSCOPY WITH PROPOFOL;  Surgeon: Jackquline Denmark, MD;  Location: [facility];  Service: Endoscopy;  Laterality: N/A;  . ESOPHAGEAL BANDING  12/25/2018   Procedure: ESOPHAGEAL BANDING;  Surgeon: Jackquline Denmark, MD;  Location: [facility];  Service: Endoscopy;;  . ESOPHAGOGASTRODUODENOSCOPY (EGD) WITH PROPOFOL N/A 12/25/2018   Procedure: ESOPHAGOGASTRODUODENOSCOPY (EGD) WITH PROPOFOL;  Surgeon: Jackquline Denmark, MD;  Location: [facility];  Service: Endoscopy;  Laterality: N/A;  . PERIPHERAL VASCULAR ATHERECTOMY Right 04/29/2016   Procedure: Peripheral Vascular Atherectomy-Right Popliteal;  Surgeon: Waynetta Sandy, MD;  Location: [place] [facility];  Service: Cardiovascular;  Laterality: Right;  . PERIPHERAL VASCULAR INTERVENTION Right 04/29/2016   Procedure: Peripheral Vascular Intervention-Right Popliteal;  Surgeon: Waynetta Sandy, MD;  Location: [facility];  Service: Cardiovascular;  Laterality: Right;  POPLITEAL PTA  . RADIOFREQUENCY ABLATION    . TOTAL KNEE ARTHROPLASTY    . TUBAL LIGATION      Family History  Problem Relation Age of Onset  .  Diabetes Mother   . Hyperlipidemia Mother   . Diabetes Father   . Hyperlipidemia Father   . Heart disease Father        before age 99  . Heart attack Father   . Diabetes Brother   . Hyperlipidemia Brother   . Heart attack Brother     Social History   Tobacco Use  . Smoking status: Former Smoker    Quit date: 03/01/1998    Years since quitting: 21.1  . Smokeless tobacco: Never Used  Substance Use Topics  . 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  . Drug use: Yes    Types: Marijuana    Comment: remote use    Current Facility-Administered Medications  Medication Dose Route Frequency Provider Last Rate Last Admin  . 0.9 %  sodium chloride infusion   Intravenous Continuous Jackquline Denmark, MD      . lactated ringers infusion    Continuous PRN Jackquline Denmark, MD 20 mL/hr at 05/01/19 0807 1,000 mL at 05/01/19 1937    Allergies  Allergen Reactions  . Indomethacin Other (See Comments)    Renal Insufficiency  . Pregabalin Other (See Comments)    DIZZINESS   . Sulfa Antibiotics Hives  . Morphine And Related Nausea And Vomiting    Review of Systems:  Constitutional: Denies fever, chills, diaphoresis, appetite change and has fatigue.  HEENT: Denies photophobia, eye pain, redness, hearing loss, ear pain, congestion, sore throat, rhinorrhea, sneezing, mouth sores, neck pain, neck stiffness and tinnitus.   Respiratory: Has SOB, DOE, cough, No chest tightness/wheezing.   Cardiovascular: Denies chest pain, palpitations and leg swelling.  Genitourinary: Denies dysuria, urgency, frequency, hematuria, flank pain and difficulty urinating.  Musculoskeletal: Has myalgias, back pain, joint swelling, arthralgias and gait problem.  Skin: No rash.  Neurological: Denies dizziness, seizures, syncope, weakness, light-headedness, numbness and headaches.  Hematological: Denies adenopathy. Has Easy  bruising. Psychiatric/Behavioral: Has anxiety or depression     Physical Exam:    BP (!) 158/55   Temp 98 F (36.7 C) (Oral)   Resp 12   Ht 5' 2"  (1.575 m)   Wt 86.6 kg   SpO2 100%   BMI 34.93 kg/m  Filed Weights   05/01/19 0759  Weight: 86.6 kg   Constitutional:  Well-developed, in no acute distress. Psychiatric: Normal mood and affect. Behavior is normal. HEENT: Pupils normal.  Conjunctivae are normal. No scleral icterus. Neck supple.  Cardiovascular: Normal rate, regular rhythm. No edema Pulmonary/chest: Effort normal and breath sounds-decreased. no wheezing, rales or rhonchi. Abdominal: Soft, nondistended. Nontender. Bowel sounds active throughout. There are no masses palpable. No hepatomegaly. Rectal:  defered Neurological: Alert and oriented to person place and time. Skin: Skin is warm and dry. No rashes noted.  Data Reviewed: I have personally reviewed following labs and imaging studies  CBC: CBC Latest Ref Rng & Units 01/17/2019 12/25/2018 10/10/2018  WBC 4.0 - 10.5 K/uL 2.7(L) 4.8 3.8(L)  Hemoglobin 12.0 - 15.0 g/dL 12.3 12.7 10.5(L)  Hematocrit 36.0 - 46.0 % 36.7 40.9 35.4(L)  Platelets 150.0 - 400.0 K/uL 65.0(L) 64(L) 66(L)    CMP: CMP Latest Ref Rng & Units 04/12/2019 02/09/2019 01/17/2019  Glucose 70 - 99 mg/dL 221(H) 234(H) 184(H)  BUN 6 - 23 mg/dL 61(H) 54(H) 19  Creatinine 0.40 - 1.20 mg/dL 1.04 1.27(H) 0.82  Sodium 135 - 145 mEq/L 131(L) 131(L) 136  Potassium 3.5 - 5.1 mEq/L 4.4 4.8 3.4(L)  Chloride 96 - 112 mEq/L 93(L) 93(L) 97  CO2 19 - 32 mEq/L 29 28 30   Calcium 8.4 - 10.5 mg/dL 10.2 10.5 9.5  Total Protein 6.0 - 8.3 g/dL 7.8 8.4(H) 7.7  Total Bilirubin 0.2 - 1.2 mg/dL 0.9 1.5(H) 1.3(H)  Alkaline Phos 39 - 117 U/L 172(H) 234(H) 238(H)  AST 0 - 37 U/L 49(H) 55(H) 62(H)  ALT 0 - 35 U/L 34 37(H) 36(H)    GFR: Estimated Creatinine Clearance: 51.4 mL/min (by C-G formula based on SCr of 1.04 mg/dL). Liver Function Tests: No results for  input(s): AST, ALT, ALKPHOS, BILITOT, PROT, ALBUMIN in the last 168 hours. 25 minutes spent with the patient today. Greater than 50% was spent in counseling and coordination of care with the patient    Radiology Studies: DG Ankle Complete Right  Result Date: 04/12/2019 CLINICAL DATA:  Pt is diabetic and had to wear boot and it has rubbed an open wound lateral ankle EXAM: RIGHT ANKLE - COMPLETE 3+ VIEW COMPARISON:  None. FINDINGS: No fracture or bone lesion. There is no bone resorption to suggest osteomyelitis. Ankle joint normally  spaced and aligned.  No arthropathic changes. Moderate dorsal and small plantar calcaneal spurs. There are faint arterial vascular calcifications. Soft tissues otherwise unremarkable. No soft tissue air. IMPRESSION: 1. No fracture or bone lesion. 2. No evidence of osteomyelitis. Electronically Signed   By: Lajean Manes M.D.   On: 04/12/2019 09:34      Carmell Austria, MD 05/01/2019, 8:38 AM  Cc: No ref. provider found

## 2019-05-01 NOTE — Op Note (Signed)
[facility] Patient Name: Alexis [name] Procedure Date: 05/01/2019 MRN: [id number] Attending MD: Jackquline Denmark , MD Date of Birth: 15-Oct-1950 CSN: [id number] Age: 69 Admit Type: Outpatient Procedure:                Upper GI endoscopy Indications:              Follow-up of esophageal varices in patient with                            NASH cirrhosis with portal hypertension Providers:                Jackquline Denmark, MD, Cleda Daub, RN, Theodora Blow,                            Technician Referring MD:              Medicines:                Monitored Anesthesia Care Complications:            No immediate complications. Estimated Blood Loss:     Estimated blood loss: none. Procedure:                Pre-Anesthesia Assessment:                           - Prior to the procedure, a History and Physical                            was performed, and patient medications and                            allergies were reviewed. The patient's tolerance of                            previous anesthesia was also reviewed. The risks                            and benefits of the procedure and the sedation                            options and risks were discussed with the patient.                            All questions were answered, and informed consent                            was obtained. Prior Anticoagulants: The patient has                            taken no previous anticoagulant or antiplatelet                            agents. ASA Grade Assessment: III - A patient with  severe systemic disease. After reviewing the risks                            and benefits, the patient was deemed in                            satisfactory condition to undergo the procedure.                           After obtaining informed consent, the endoscope was                            passed under direct vision. Throughout the                            procedure, the  patient's blood pressure, pulse, and                            oxygen saturations were monitored continuously. The                            GIF-H190 ([id number]) Olympus gastroscope was                            introduced through the mouth, and advanced to the                            second part of duodenum. The upper GI endoscopy was                            accomplished without difficulty. The patient                            tolerated the procedure well. Scope In: Scope Out: Findings:      3 channels of Grade II varices were found in the lower third of the       esophagus. They were 4-6 mm in largest diameter. Three bands were       successfully placed (first band misfired, 1 band on each was placed)       with complete eradication, resulting in deflation of varices. There was       no bleeding during and at the end of the procedure.      Moderate portal hypertensive gastropathy was found in the stomach. No       fundal varices.      The examined duodenum was normal. Impression:               - Grade II esophageal varices. Completely                            eradicated. Banded.                           - Portal hypertensive gastropathy.                           -  Normal examined duodenum.                           - No specimens collected. Moderate Sedation:      Not Applicable - Patient had care per Anesthesia. Recommendation:           - Patient has a contact number available for                            emergencies. The signs and symptoms of potential                            delayed complications were discussed with the                            patient. Return to normal activities tomorrow.                            Written discharge instructions were provided to the                            patient.                           - Soft low salt diet today, then resume previous                            diet from tomorrow onwatds.                           -  Continue present medications.                           - Return to GI office in 4 weeks.                           - Repeat EGD with EVL (if needed) in 6 to 8 weeks.                           - Resume Aciphex 73m po QD.                           - Avoid NSAIDs.                           - The findings and recommendations were discussed                            with the patient's daughter JAlmyra [phone] Procedure Code(s):        --- Professional ---                           [phone] Esophagogastroduodenoscopy, flexible,                            transoral; with band ligation of esophageal/gastric  varices Diagnosis Code(s):        --- Professional ---                           I85.00, Esophageal varices without bleeding                           K76.6, Portal hypertension                           K31.89, Other diseases of stomach and duodenum CPT copyright 2019 American Medical Association. All rights reserved. The codes documented in this report are preliminary and upon coder review may  be revised to meet current compliance requirements. Jackquline Denmark, MD 05/01/2019 9:10:57 AM This report has been signed electronically. Number of Addenda: 0

## 2019-05-01 NOTE — Telephone Encounter (Signed)

## 2019-05-01 NOTE — Anesthesia Postprocedure Evaluation (Signed)
Anesthesia Post Note  Patient: Alexis [name]  Procedure(s) Performed: ESOPHAGOGASTRODUODENOSCOPY (EGD) WITH PROPOFOL (N/A ) ESOPHAGEAL BANDING     Patient location during evaluation: Endoscopy Anesthesia Type: MAC Level of consciousness: awake and alert Pain management: pain level controlled Vital Signs Assessment: post-procedure vital signs reviewed and stable Respiratory status: spontaneous breathing, nonlabored ventilation, respiratory function stable and patient connected to nasal cannula oxygen Cardiovascular status: blood pressure returned to baseline and stable Postop Assessment: no apparent nausea or vomiting Anesthetic complications: no    Last Vitals:  Vitals:   05/01/19 0920 05/01/19 0930  BP: 137/72 (!) 142/75  Pulse: (!) 58   Resp: 17 17  Temp:    SpO2: 99%     Last Pain:  Vitals:   05/01/19 0930  TempSrc:   PainSc: 0-No pain                 Barnet Glasgow

## 2019-05-02 ENCOUNTER — Encounter (HOSPITAL_COMMUNITY): Payer: HMO

## 2019-05-02 ENCOUNTER — Encounter: Payer: Self-pay | Admitting: *Deleted

## 2019-05-02 ENCOUNTER — Ambulatory Visit: Payer: Self-pay

## 2019-05-02 ENCOUNTER — Telehealth: Payer: Self-pay

## 2019-05-02 ENCOUNTER — Ambulatory Visit: Payer: HMO | Admitting: Vascular Surgery

## 2019-05-02 ENCOUNTER — Telehealth: Payer: Self-pay | Admitting: Medical

## 2019-05-02 DIAGNOSIS — G609 Hereditary and idiopathic neuropathy, unspecified: Secondary | ICD-10-CM | POA: Diagnosis not present

## 2019-05-02 DIAGNOSIS — I110 Hypertensive heart disease with heart failure: Secondary | ICD-10-CM | POA: Diagnosis not present

## 2019-05-02 DIAGNOSIS — N390 Urinary tract infection, site not specified: Secondary | ICD-10-CM | POA: Diagnosis not present

## 2019-05-02 DIAGNOSIS — I4891 Unspecified atrial fibrillation: Secondary | ICD-10-CM | POA: Diagnosis not present

## 2019-05-02 DIAGNOSIS — E119 Type 2 diabetes mellitus without complications: Secondary | ICD-10-CM | POA: Diagnosis not present

## 2019-05-02 DIAGNOSIS — I509 Heart failure, unspecified: Secondary | ICD-10-CM | POA: Diagnosis not present

## 2019-05-02 DIAGNOSIS — S31109D Unspecified open wound of abdominal wall, unspecified quadrant without penetration into peritoneal cavity, subsequent encounter: Secondary | ICD-10-CM | POA: Diagnosis not present

## 2019-05-02 NOTE — Telephone Encounter (Signed)
Office visit notes faxed to [facility]

## 2019-05-02 NOTE — Telephone Encounter (Signed)
Can give vo for skilled nursing.

## 2019-05-02 NOTE — Telephone Encounter (Addendum)
Patient is requesting a referral to a foot and ankle specialist. Patient states that she did not call back to make an appointment.   Patient states that her insurance has a team that will come over to her house to take care of her foot.  Patient also states that she would like to speak with Percell Miller in regards to Tramadol Medication.   Patient states she is having a diarrhea from antibotic   Patient states that her foot is black   Please advise   Patient would like a call back.   Patient will not be home tomorrow morning, patient will return home around 1pm   Patient also need clinical notes/Docto order  sent to insurance company so that she can have her wheelchair fixed  [employer] :  Fax [phone]

## 2019-05-02 NOTE — Telephone Encounter (Signed)
Alexis [name] from home health was aware of the VO and Alexis [name] wants a new a foot doctor , so does  A new referral need to be dropped?

## 2019-05-02 NOTE — Telephone Encounter (Signed)
Alexis [name] from [facility] Need verbal order for skilled nursing for twice a week for 4 weeks, due to wounds on the patients left lower abdomin and on her right foot,   Please call Alexis [name] at [phone] as soon as possible thanks,

## 2019-05-02 NOTE — Telephone Encounter (Signed)
Is it okay to give VO ?

## 2019-05-02 NOTE — Telephone Encounter (Signed)
I have put that referral in. Problem is Alexis [name] is out. Marita Kansas is not here. Not sure who will follow up on that referral to new podiatrist. Ask her to update [place] what vascular surgeon says. Then will push for new podiatrist. She sees them tomorrow.

## 2019-05-03 ENCOUNTER — Ambulatory Visit (HOSPITAL_COMMUNITY)
Admission: RE | Admit: 2019-05-03 | Discharge: 2019-05-03 | Disposition: A | Discharge status: Home / Self Care | Payer: HMO | Source: Ambulatory Visit | Attending: Vascular Surgery | Admitting: Vascular Surgery

## 2019-05-03 ENCOUNTER — Encounter: Payer: Self-pay | Admitting: Vascular Surgery

## 2019-05-03 ENCOUNTER — Ambulatory Visit (INDEPENDENT_AMBULATORY_CARE_PROVIDER_SITE_OTHER): Payer: HMO | Admitting: Vascular Surgery

## 2019-05-03 ENCOUNTER — Other Ambulatory Visit: Payer: Self-pay

## 2019-05-03 ENCOUNTER — Telehealth: Payer: Self-pay | Admitting: *Deleted

## 2019-05-03 ENCOUNTER — Other Ambulatory Visit (HOSPITAL_COMMUNITY)
Admission: RE | Admit: 2019-05-03 | Discharge: 2019-05-03 | Disposition: A | Discharge status: Home / Self Care | Payer: HMO | Source: Ambulatory Visit | Attending: Vascular Surgery | Admitting: Vascular Surgery

## 2019-05-03 ENCOUNTER — Telehealth: Payer: Self-pay | Admitting: Medical

## 2019-05-03 VITALS — BP 118/64 | HR 70 | Temp 97.0°F | Resp 16 | Ht 62.0 in | Wt 188.0 lb

## 2019-05-03 DIAGNOSIS — E785 Hyperlipidemia, unspecified: Secondary | ICD-10-CM | POA: Insufficient documentation

## 2019-05-03 DIAGNOSIS — I739 Peripheral vascular disease, unspecified: Secondary | ICD-10-CM | POA: Diagnosis not present

## 2019-05-03 DIAGNOSIS — I10 Essential (primary) hypertension: Secondary | ICD-10-CM | POA: Insufficient documentation

## 2019-05-03 DIAGNOSIS — Z20822 Contact with and (suspected) exposure to covid-19: Secondary | ICD-10-CM | POA: Insufficient documentation

## 2019-05-03 DIAGNOSIS — Z87891 Personal history of nicotine dependence: Secondary | ICD-10-CM | POA: Diagnosis not present

## 2019-05-03 DIAGNOSIS — I70299 Other atherosclerosis of native arteries of extremities, unspecified extremity: Secondary | ICD-10-CM | POA: Diagnosis not present

## 2019-05-03 DIAGNOSIS — Z01818 Encounter for other preprocedural examination: Secondary | ICD-10-CM | POA: Diagnosis not present

## 2019-05-03 DIAGNOSIS — Z9862 Peripheral vascular angioplasty status: Secondary | ICD-10-CM | POA: Diagnosis not present

## 2019-05-03 DIAGNOSIS — L97909 Non-pressure chronic ulcer of unspecified part of unspecified lower leg with unspecified severity: Secondary | ICD-10-CM | POA: Diagnosis not present

## 2019-05-03 MED ORDER — LORAZEPAM 0.5 MG PO TABS: 0.5000 mg | ORAL_TABLET | Freq: Three times a day (TID) | ORAL | 0 refills | Status: DC | PRN

## 2019-05-03 NOTE — H&P (View-Only) (Signed)
Patient name: Alexis [name] MRN: [id number] DOB: [birth year] Sex: female  REASON FOR VISIT:   Follow-up of peripheral vascular disease.  HPI:   Alexis [name] is a pleasant 69 y.o. female who I last saw on 10/04/2018.  She has infrainguinal arterial occlusive disease bilaterally.  On the right side Dr. Donzetta Matters has previously performed atherectomy of the right superficial femoral artery and popliteal artery with drug-coated balloon angioplasty.  She has peroneal runoff only on the right.  Since I saw her last she developed a small wound on her right great toe which progressed into a more significant wound and also she developed a small wound on her right heel.  She was referred with critical limb ischemia.  She has also been diagnosed with cirrhosis of the liver and it is gone from 250 pounds to it 180 pounds.  She does have a significant cardiac history and with a history of previous myocardial infarctions and also congestive heart failure.   She denies significant rest pain in the feet.  She does have some claudication bilaterally.  Past Medical History:  Diagnosis Date  . Allergy   . Anxiety   . Arthritis   . Asthma   . Atrial fibrillation ([place])   . CHF (congestive heart failure) ([place])   . Cirrhosis ([place])   . COPD (chronic obstructive pulmonary disease) ([place])   . Diabetes mellitus without complication ([place])   . Hyperlipidemia   . Hypertension   . Macrocytic anemia 09/04/2018  . Myocardial infarction ([place])    several  . Pacemaker    CRT-P with RV lead and His Bundle lead ( high threshold)   . Peripheral neuropathy   . Pneumonia   . PONV (postoperative nausea and vomiting)    unsure of exactly what happened at [facility] in 2010 (knee surgery) that led to crital care  . Thyroid disease     Family History  Problem Relation Age of Onset  . Diabetes Mother   . Hyperlipidemia Mother   . Diabetes Father   . Hyperlipidemia Father   . Heart disease Father    before age 89  . Heart attack Father   . Diabetes Brother   . Hyperlipidemia Brother   . Heart attack Brother     SOCIAL HISTORY: Social History   Tobacco Use  . Smoking status: Former Smoker    Quit date: 03/01/1998    Years since quitting: 21.1  . Smokeless tobacco: Never Used  Substance Use Topics  . 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    Allergies  Allergen Reactions  . Indomethacin Other (See Comments)    Renal Insufficiency  . Pregabalin Other (See Comments)    DIZZINESS   . Sulfa Antibiotics Hives  . Morphine And Related Nausea And Vomiting    Current Outpatient Medications  Medication Sig Dispense Refill  . acetaminophen (TYLENOL) 325 MG tablet Take 650 mg by mouth every 6 (six) hours as needed for moderate pain or headache.    . albuterol (VENTOLIN HFA) 108 (90 Base) MCG/ACT inhaler Inhale 2 puffs into the lungs every 6 (six) hours as needed for wheezing or shortness of breath.    . allopurinol (ZYLOPRIM) 100 MG tablet TAKE ONE TABLET BY MOUTH TWICE DAILY (Patient taking differently: Take 100 mg by mouth 2 (two) times daily. ) 60 tablet 3  . AMBULATORY NON FORMULARY MEDICATION Motorized  scooter.  Diagnosis: Osteoarthritis M19.90 1 each 0  . azelastine (ASTELIN) 0.1 % nasal spray Place 2 sprays into both nostrils 2 (two) times daily. Use in each nostril as directed    . Continuous Blood Gluc Sensor (FREESTYLE LIBRE 14 DAY SENSOR) MISC     . dicloxacillin (DYNAPEN) 500 MG capsule Take 1 capsule (500 mg total) by mouth 3 (three) times daily. 30 capsule 0  . fluticasone (FLONASE) 50 MCG/ACT nasal spray Place 2 sprays into both nostrils at bedtime. (Patient taking differently: Place 2 sprays into both nostrils in the morning and at bedtime. )    . furosemide (LASIX) 20 MG tablet Take 2 tables in the morning and 1 tablet at noon (Patient taking differently: Take 20-40 mg by mouth daily. Take  40 mg in the morning and 20 mg in the afternoon) 270 tablet 1  . insulin aspart (NOVOLOG) 100 UNIT/ML injection Inject 10-21 Units into the skin 3 (three) times daily as needed for high blood sugar.    . levocetirizine (XYZAL) 5 MG tablet Take 5 mg by mouth at bedtime.    . Levothyroxine Sodium 150 MCG CAPS Take 1 capsule (150 mcg total) by mouth daily before breakfast. 30 capsule 3  . Lidocaine HCl 4 % LIQD Apply 10 mLs topically 2 (two) times daily. (Patient taking differently: Apply 10 mLs topically 2 (two) times daily as needed (pain). ) 73 mL 0  . losartan (COZAAR) 25 MG tablet TAKE ONE (1) TABLET BY MOUTH EVERY DAY (Patient taking differently: Take 25 mg by mouth daily. ) 90 tablet 0  . metoprolol succinate (TOPROL-XL) 25 MG 24 hr tablet TAKE ONE (1) TABLET BY MOUTH EACH DAY (Patient taking differently: Take 25 mg by mouth daily. ) 90 tablet 1  . ondansetron (ZOFRAN-ODT) 4 MG disintegrating tablet Take 4 mg by mouth every 8 (eight) hours as needed.    . Prenatal Vit-Fe Fumarate-FA (MULTIVITAMIN-PRENATAL) 27-0.8 MG TABS tablet Take 1 tablet by mouth daily.     . psyllium (METAMUCIL SMOOTH TEXTURE) 58.6 % powder Take 1 packet by mouth daily. (Patient taking differently: Take 1 packet by mouth daily as needed (constipation). ) 283 g 12  . RABEprazole (ACIPHEX) 20 MG tablet Take 1 tablet (20 mg total) by mouth daily. 30 tablet 1  . spironolactone (ALDACTONE) 50 MG tablet Take 1 tablet (50 mg total) by mouth daily. 30 tablet 11   No current facility-administered medications for this visit.    REVIEW OF SYSTEMS:  [X]  denotes positive finding, [ ]  denotes negative finding Cardiac  Comments:  Chest pain or chest pressure:    Shortness of breath upon exertion: x   Short of breath when lying flat:    Irregular heart rhythm:        Vascular    Pain in calf, thigh, or hip brought on by ambulation:    Pain in feet at night that wakes you up from your sleep:     Blood clot in your veins:    Leg  swelling:         Pulmonary    Oxygen at home:    Productive cough:     Wheezing:         Neurologic    Sudden weakness in arms or legs:     Sudden numbness in arms or legs:     Sudden onset of difficulty speaking or slurred speech:    Temporary loss of vision in one eye:     Problems  with dizziness:         Gastrointestinal    Blood in stool:     Vomited blood:         Genitourinary    Burning when urinating:     Blood in urine:        Psychiatric    Major depression:         Hematologic    Bleeding problems:    Problems with blood clotting too easily:        Skin    Rashes or ulcers:        Constitutional    Fever or chills:     PHYSICAL EXAM:   Vitals:   05/03/19 1208  BP: 118/64  Pulse: 70  Resp: 16  Temp: (!) 97 F (36.1 C)  TempSrc: Temporal  SpO2: 99%  Weight: 188 lb (85.3 kg)  Height: 5' 2"  (1.575 m)    GENERAL: The patient is a well-nourished female, in no acute distress. The vital signs are documented above. CARDIAC: There is a regular rate and rhythm.  VASCULAR: I do not detect carotid bruits. On the right side, which is the side of concern, I cannot palpate a femoral pulse.  She has some superficial ulcerations on her pannus.  She has monophasic Doppler signals in the right foot in the dorsalis pedis and posterior tibial positions. On the left side she has a palpable femoral pulse.  She has some superficial ulcerations on her pannus.  She has monophasic Doppler signals in the left foot. PULMONARY: There is good air exchange bilaterally without wheezing or rales. ABDOMEN: Soft and non-tender with normal pitched bowel sounds.  MUSCULOSKELETAL: There are no major deformities or cyanosis. NEUROLOGIC: No focal weakness or paresthesias are detected. SKIN: She has extensive wounds on the right foot involving the right great toe and heel.      PSYCHIATRIC: The patient has a normal affect.  DATA:    ARTERIAL DOPPLER STUDY: I have independently  interpreted her arterial Doppler study today.  On the right side there is a monophasic dorsalis pedis and posterior tibial signal.  ABI is 41%.  Toe pressure 0.  On the left side she has a monophasic dorsalis pedis and posterior tibial signal.  ABIs 55%.  Toe pressure 0.  LABS: I reviewed her labs from 04/12/2019.  GFR was 52.  Creatinine 1.04.  MEDICAL ISSUES:   CRITICAL LIMB ISCHEMIA: This patient has extensive wounds of the right foot with severe multilevel arterial occlusive disease.  This is clearly a limb threatening problem.  Based on her previous arteriogram her only runoff was the peroneal artery.  I am concerned that she now has evidence of inflow disease.  I recommended we proceed with arteriography in hopes of finding something to do from an endovascular standpoint.  She would be at very high risk for surgery given her obesity, diabetes, and cirrhosis.  She has had previous vein map on the left which showed only a small vein.  She has not had vein mapping on the right.  I have reviewed with the patient the indications for arteriography. In addition, I have reviewed the potential complications of arteriography including but not limited to: Bleeding, arterial injury, arterial thrombosis, dye action, renal insufficiency, or other unpredictable medical problems. I have explained to the patient that if we find disease amenable to angioplasty we could potentially address this at the same time. I have discussed the potential complications of angioplasty and stenting, including but not limited  to: Bleeding, arterial thrombosis, arterial injury, dissection, or the need for surgical intervention.   [name] [facility] [phone]

## 2019-05-03 NOTE — Telephone Encounter (Signed)
Rx ativan sent to pt pharmacy.High level stress/anxiety post vascular consult.

## 2019-05-03 NOTE — Telephone Encounter (Signed)
Patient just left the vascular office.  They are going to have to take her leg off. She is very tearful and is in need of something to help her calm down. She goes in the morning at 8am for peripheral vascular study.  She state this is a lot on her.

## 2019-05-03 NOTE — Telephone Encounter (Signed)
Patient went to visit to vascular today and leg is going to have to be removed .

## 2019-05-03 NOTE — Telephone Encounter (Signed)
Left detailed message on machine that medication has been sent in.

## 2019-05-03 NOTE — Telephone Encounter (Signed)
Rx ativan sent to pharmacy.

## 2019-05-03 NOTE — Progress Notes (Signed)
Patient name: Alexis [name] MRN: [id number] DOB: 10-Sep-1950 Sex: female  REASON FOR VISIT:   Follow-up of peripheral vascular disease.  HPI:   Alexis [name] is a pleasant 69 y.o. female who I last saw on 10/04/2018.  She has infrainguinal arterial occlusive disease bilaterally.  On the right side Dr. Donzetta Matters has previously performed atherectomy of the right superficial femoral artery and popliteal artery with drug-coated balloon angioplasty.  She has peroneal runoff only on the right.  Since I saw her last she developed a small wound on her right great toe which progressed into a more significant wound and also she developed a small wound on her right heel.  She was referred with critical limb ischemia.  She has also been diagnosed with cirrhosis of the liver and it is gone from 250 pounds to it 180 pounds.  She does have a significant cardiac history and with a history of previous myocardial infarctions and also congestive heart failure.   She denies significant rest pain in the feet.  She does have some claudication bilaterally.  Past Medical History:  Diagnosis Date  . Allergy   . Anxiety   . Arthritis   . Asthma   . Atrial fibrillation ([place])   . CHF (congestive heart failure) ([place])   . Cirrhosis ([place])   . COPD (chronic obstructive pulmonary disease) ([place])   . Diabetes mellitus without complication ([place])   . Hyperlipidemia   . Hypertension   . Macrocytic anemia 09/04/2018  . Myocardial infarction ([place])    several  . Pacemaker    CRT-P with RV lead and His Bundle lead ( high threshold)   . Peripheral neuropathy   . Pneumonia   . PONV (postoperative nausea and vomiting)    unsure of exactly what happened at [facility] in 2010 (knee surgery) that led to crital care  . Thyroid disease     Family History  Problem Relation Age of Onset  . Diabetes Mother   . Hyperlipidemia Mother   . Diabetes Father   . Hyperlipidemia Father   . Heart disease Father    before age 23  . Heart attack Father   . Diabetes Brother   . Hyperlipidemia Brother   . Heart attack Brother     SOCIAL HISTORY: Social History   Tobacco Use  . Smoking status: Former Smoker    Quit date: 03/01/1998    Years since quitting: 21.1  . Smokeless tobacco: Never Used  Substance Use Topics  . 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    Allergies  Allergen Reactions  . Indomethacin Other (See Comments)    Renal Insufficiency  . Pregabalin Other (See Comments)    DIZZINESS   . Sulfa Antibiotics Hives  . Morphine And Related Nausea And Vomiting    Current Outpatient Medications  Medication Sig Dispense Refill  . acetaminophen (TYLENOL) 325 MG tablet Take 650 mg by mouth every 6 (six) hours as needed for moderate pain or headache.    . albuterol (VENTOLIN HFA) 108 (90 Base) MCG/ACT inhaler Inhale 2 puffs into the lungs every 6 (six) hours as needed for wheezing or shortness of breath.    . allopurinol (ZYLOPRIM) 100 MG tablet TAKE ONE TABLET BY MOUTH TWICE DAILY (Patient taking differently: Take 100 mg by mouth 2 (two) times daily. ) 60 tablet 3  . AMBULATORY NON FORMULARY MEDICATION Motorized  scooter.  Diagnosis: Osteoarthritis M19.90 1 each 0  . azelastine (ASTELIN) 0.1 % nasal spray Place 2 sprays into both nostrils 2 (two) times daily. Use in each nostril as directed    . Continuous Blood Gluc Sensor (FREESTYLE LIBRE 14 DAY SENSOR) MISC     . dicloxacillin (DYNAPEN) 500 MG capsule Take 1 capsule (500 mg total) by mouth 3 (three) times daily. 30 capsule 0  . fluticasone (FLONASE) 50 MCG/ACT nasal spray Place 2 sprays into both nostrils at bedtime. (Patient taking differently: Place 2 sprays into both nostrils in the morning and at bedtime. )    . furosemide (LASIX) 20 MG tablet Take 2 tables in the morning and 1 tablet at noon (Patient taking differently: Take 20-40 mg by mouth daily. Take  40 mg in the morning and 20 mg in the afternoon) 270 tablet 1  . insulin aspart (NOVOLOG) 100 UNIT/ML injection Inject 10-21 Units into the skin 3 (three) times daily as needed for high blood sugar.    . levocetirizine (XYZAL) 5 MG tablet Take 5 mg by mouth at bedtime.    . Levothyroxine Sodium 150 MCG CAPS Take 1 capsule (150 mcg total) by mouth daily before breakfast. 30 capsule 3  . Lidocaine HCl 4 % LIQD Apply 10 mLs topically 2 (two) times daily. (Patient taking differently: Apply 10 mLs topically 2 (two) times daily as needed (pain). ) 73 mL 0  . losartan (COZAAR) 25 MG tablet TAKE ONE (1) TABLET BY MOUTH EVERY DAY (Patient taking differently: Take 25 mg by mouth daily. ) 90 tablet 0  . metoprolol succinate (TOPROL-XL) 25 MG 24 hr tablet TAKE ONE (1) TABLET BY MOUTH EACH DAY (Patient taking differently: Take 25 mg by mouth daily. ) 90 tablet 1  . ondansetron (ZOFRAN-ODT) 4 MG disintegrating tablet Take 4 mg by mouth every 8 (eight) hours as needed.    . Prenatal Vit-Fe Fumarate-FA (MULTIVITAMIN-PRENATAL) 27-0.8 MG TABS tablet Take 1 tablet by mouth daily.     . psyllium (METAMUCIL SMOOTH TEXTURE) 58.6 % powder Take 1 packet by mouth daily. (Patient taking differently: Take 1 packet by mouth daily as needed (constipation). ) 283 g 12  . RABEprazole (ACIPHEX) 20 MG tablet Take 1 tablet (20 mg total) by mouth daily. 30 tablet 1  . spironolactone (ALDACTONE) 50 MG tablet Take 1 tablet (50 mg total) by mouth daily. 30 tablet 11   No current facility-administered medications for this visit.    REVIEW OF SYSTEMS:  [X]  denotes positive finding, [ ]  denotes negative finding Cardiac  Comments:  Chest pain or chest pressure:    Shortness of breath upon exertion: x   Short of breath when lying flat:    Irregular heart rhythm:        Vascular    Pain in calf, thigh, or hip brought on by ambulation:    Pain in feet at night that wakes you up from your sleep:     Blood clot in your veins:    Leg  swelling:         Pulmonary    Oxygen at home:    Productive cough:     Wheezing:         Neurologic    Sudden weakness in arms or legs:     Sudden numbness in arms or legs:     Sudden onset of difficulty speaking or slurred speech:    Temporary loss of vision in one eye:     Problems  with dizziness:         Gastrointestinal    Blood in stool:     Vomited blood:         Genitourinary    Burning when urinating:     Blood in urine:        Psychiatric    Major depression:         Hematologic    Bleeding problems:    Problems with blood clotting too easily:        Skin    Rashes or ulcers:        Constitutional    Fever or chills:     PHYSICAL EXAM:   Vitals:   05/03/19 1208  BP: 118/64  Pulse: 70  Resp: 16  Temp: (!) 97 F (36.1 C)  TempSrc: Temporal  SpO2: 99%  Weight: 188 lb (85.3 kg)  Height: 5' 2"  (1.575 m)    GENERAL: The patient is a well-nourished female, in no acute distress. The vital signs are documented above. CARDIAC: There is a regular rate and rhythm.  VASCULAR: I do not detect carotid bruits. On the right side, which is the side of concern, I cannot palpate a femoral pulse.  She has some superficial ulcerations on her pannus.  She has monophasic Doppler signals in the right foot in the dorsalis pedis and posterior tibial positions. On the left side she has a palpable femoral pulse.  She has some superficial ulcerations on her pannus.  She has monophasic Doppler signals in the left foot. PULMONARY: There is good air exchange bilaterally without wheezing or rales. ABDOMEN: Soft and non-tender with normal pitched bowel sounds.  MUSCULOSKELETAL: There are no major deformities or cyanosis. NEUROLOGIC: No focal weakness or paresthesias are detected. SKIN: She has extensive wounds on the right foot involving the right great toe and heel.      PSYCHIATRIC: The patient has a normal affect.  DATA:    ARTERIAL DOPPLER STUDY: I have independently  interpreted her arterial Doppler study today.  On the right side there is a monophasic dorsalis pedis and posterior tibial signal.  ABI is 41%.  Toe pressure 0.  On the left side she has a monophasic dorsalis pedis and posterior tibial signal.  ABIs 55%.  Toe pressure 0.  LABS: I reviewed her labs from 04/12/2019.  GFR was 52.  Creatinine 1.04.  MEDICAL ISSUES:   CRITICAL LIMB ISCHEMIA: This patient has extensive wounds of the right foot with severe multilevel arterial occlusive disease.  This is clearly a limb threatening problem.  Based on her previous arteriogram her only runoff was the peroneal artery.  I am concerned that she now has evidence of inflow disease.  I recommended we proceed with arteriography in hopes of finding something to do from an endovascular standpoint.  She would be at very high risk for surgery given her obesity, diabetes, and cirrhosis.  She has had previous vein map on the left which showed only a small vein.  She has not had vein mapping on the right.  I have reviewed with the patient the indications for arteriography. In addition, I have reviewed the potential complications of arteriography including but not limited to: Bleeding, arterial injury, arterial thrombosis, dye action, renal insufficiency, or other unpredictable medical problems. I have explained to the patient that if we find disease amenable to angioplasty we could potentially address this at the same time. I have discussed the potential complications of angioplasty and stenting, including but not limited  to: Bleeding, arterial thrombosis, arterial injury, dissection, or the need for surgical intervention.   [name] [facility] [phone]

## 2019-05-04 ENCOUNTER — Encounter: Payer: Self-pay | Admitting: Vascular Surgery

## 2019-05-04 ENCOUNTER — Ambulatory Visit (HOSPITAL_COMMUNITY)
Admission: RE | Admit: 2019-05-04 | Discharge: 2019-05-04 | Disposition: A | Discharge status: Home / Self Care | Payer: HMO | Source: Ambulatory Visit | Attending: Vascular Surgery | Admitting: Vascular Surgery

## 2019-05-04 ENCOUNTER — Ambulatory Visit (HOSPITAL_BASED_OUTPATIENT_CLINIC_OR_DEPARTMENT_OTHER): Payer: HMO

## 2019-05-04 ENCOUNTER — Other Ambulatory Visit: Payer: Self-pay

## 2019-05-04 ENCOUNTER — Encounter (HOSPITAL_COMMUNITY)
Admission: RE | Disposition: A | Discharge status: Home / Self Care | Payer: Self-pay | Source: Ambulatory Visit | Attending: Vascular Surgery

## 2019-05-04 DIAGNOSIS — L97519 Non-pressure chronic ulcer of other part of right foot with unspecified severity: Secondary | ICD-10-CM | POA: Insufficient documentation

## 2019-05-04 DIAGNOSIS — Z794 Long term (current) use of insulin: Secondary | ICD-10-CM | POA: Insufficient documentation

## 2019-05-04 DIAGNOSIS — I96 Gangrene, not elsewhere classified: Secondary | ICD-10-CM | POA: Diagnosis not present

## 2019-05-04 DIAGNOSIS — Z87891 Personal history of nicotine dependence: Secondary | ICD-10-CM | POA: Insufficient documentation

## 2019-05-04 DIAGNOSIS — I509 Heart failure, unspecified: Secondary | ICD-10-CM | POA: Insufficient documentation

## 2019-05-04 DIAGNOSIS — I110 Hypertensive heart disease with heart failure: Secondary | ICD-10-CM | POA: Diagnosis not present

## 2019-05-04 DIAGNOSIS — I4891 Unspecified atrial fibrillation: Secondary | ICD-10-CM | POA: Diagnosis not present

## 2019-05-04 DIAGNOSIS — Z950 Presence of cardiac pacemaker: Secondary | ICD-10-CM | POA: Insufficient documentation

## 2019-05-04 DIAGNOSIS — E079 Disorder of thyroid, unspecified: Secondary | ICD-10-CM | POA: Insufficient documentation

## 2019-05-04 DIAGNOSIS — Z885 Allergy status to narcotic agent status: Secondary | ICD-10-CM | POA: Diagnosis not present

## 2019-05-04 DIAGNOSIS — Z833 Family history of diabetes mellitus: Secondary | ICD-10-CM | POA: Insufficient documentation

## 2019-05-04 DIAGNOSIS — Z882 Allergy status to sulfonamides status: Secondary | ICD-10-CM | POA: Insufficient documentation

## 2019-05-04 DIAGNOSIS — L97419 Non-pressure chronic ulcer of right heel and midfoot with unspecified severity: Secondary | ICD-10-CM | POA: Insufficient documentation

## 2019-05-04 DIAGNOSIS — E1152 Type 2 diabetes mellitus with diabetic peripheral angiopathy with gangrene: Secondary | ICD-10-CM | POA: Insufficient documentation

## 2019-05-04 DIAGNOSIS — Z6834 Body mass index (BMI) 34.0-34.9, adult: Secondary | ICD-10-CM | POA: Insufficient documentation

## 2019-05-04 DIAGNOSIS — Z888 Allergy status to other drugs, medicaments and biological substances status: Secondary | ICD-10-CM | POA: Diagnosis not present

## 2019-05-04 DIAGNOSIS — M1990 Unspecified osteoarthritis, unspecified site: Secondary | ICD-10-CM | POA: Insufficient documentation

## 2019-05-04 DIAGNOSIS — I739 Peripheral vascular disease, unspecified: Secondary | ICD-10-CM

## 2019-05-04 DIAGNOSIS — K7460 Unspecified cirrhosis of liver: Secondary | ICD-10-CM | POA: Insufficient documentation

## 2019-05-04 DIAGNOSIS — I252 Old myocardial infarction: Secondary | ICD-10-CM | POA: Insufficient documentation

## 2019-05-04 DIAGNOSIS — J449 Chronic obstructive pulmonary disease, unspecified: Secondary | ICD-10-CM | POA: Diagnosis not present

## 2019-05-04 DIAGNOSIS — E1142 Type 2 diabetes mellitus with diabetic polyneuropathy: Secondary | ICD-10-CM | POA: Insufficient documentation

## 2019-05-04 DIAGNOSIS — Z79899 Other long term (current) drug therapy: Secondary | ICD-10-CM | POA: Insufficient documentation

## 2019-05-04 DIAGNOSIS — I70261 Atherosclerosis of native arteries of extremities with gangrene, right leg: Secondary | ICD-10-CM | POA: Diagnosis not present

## 2019-05-04 DIAGNOSIS — E669 Obesity, unspecified: Secondary | ICD-10-CM | POA: Insufficient documentation

## 2019-05-04 DIAGNOSIS — E785 Hyperlipidemia, unspecified: Secondary | ICD-10-CM | POA: Insufficient documentation

## 2019-05-04 DIAGNOSIS — E11621 Type 2 diabetes mellitus with foot ulcer: Secondary | ICD-10-CM | POA: Diagnosis not present

## 2019-05-04 DIAGNOSIS — Z8249 Family history of ischemic heart disease and other diseases of the circulatory system: Secondary | ICD-10-CM | POA: Insufficient documentation

## 2019-05-04 HISTORY — PX: ABDOMINAL AORTOGRAM W/LOWER EXTREMITY: CATH118223

## 2019-05-04 LAB — PROTIME-INR
INR: 1.1 (ref 0.8–1.2)
Prothrombin Time: 14.6 seconds (ref 11.4–15.2)

## 2019-05-04 LAB — POCT I-STAT, CHEM 8
BUN: 54 mg/dL — ABNORMAL HIGH (ref 8–23)
Calcium, Ion: 1.19 mmol/L (ref 1.15–1.40)
Chloride: 98 mmol/L (ref 98–111)
Creatinine, Ser: 1.2 mg/dL — ABNORMAL HIGH (ref 0.44–1.00)
Glucose, Bld: 165 mg/dL — ABNORMAL HIGH (ref 70–99)
HCT: 45 % (ref 36.0–46.0)
Hemoglobin: 15.3 g/dL — ABNORMAL HIGH (ref 12.0–15.0)
Potassium: 3.3 mmol/L — ABNORMAL LOW (ref 3.5–5.1)
Sodium: 134 mmol/L — ABNORMAL LOW (ref 135–145)
TCO2: 26 mmol/L (ref 22–32)

## 2019-05-04 LAB — APTT: aPTT: 29 seconds (ref 24–36)

## 2019-05-04 LAB — GLUCOSE, CAPILLARY: Glucose-Capillary: 141 mg/dL — ABNORMAL HIGH (ref 70–99)

## 2019-05-04 LAB — SARS CORONAVIRUS 2 (TAT 6-24 HRS): SARS Coronavirus 2: NEGATIVE

## 2019-05-04 SURGERY — ABDOMINAL AORTOGRAM W/LOWER EXTREMITY
Anesthesia: LOCAL | Laterality: Bilateral

## 2019-05-04 MED ORDER — LIDOCAINE HCL (PF) 1 % IJ SOLN
INTRAMUSCULAR | Status: AC
  Filled 2019-05-04: qty 30

## 2019-05-04 MED ORDER — LABETALOL HCL 5 MG/ML IV SOLN: 10.0000 mg | INTRAVENOUS | Status: DC | PRN

## 2019-05-04 MED ORDER — MIDAZOLAM HCL 2 MG/2ML IJ SOLN
INTRAMUSCULAR | Status: AC
  Filled 2019-05-04: qty 2

## 2019-05-04 MED ORDER — FENTANYL CITRATE (PF) 100 MCG/2ML IJ SOLN
INTRAMUSCULAR | Status: AC
  Filled 2019-05-04: qty 2

## 2019-05-04 MED ORDER — HYDRALAZINE HCL 20 MG/ML IJ SOLN: 5.0000 mg | INTRAMUSCULAR | Status: DC | PRN

## 2019-05-04 MED ORDER — ACETAMINOPHEN 325 MG PO TABS: 650.0000 mg | ORAL_TABLET | ORAL | Status: DC | PRN

## 2019-05-04 MED ORDER — HEPARIN (PORCINE) IN NACL 1000-0.9 UT/500ML-% IV SOLN
INTRAVENOUS | Status: AC
  Filled 2019-05-04: qty 1000

## 2019-05-04 MED ORDER — SODIUM CHLORIDE 0.9 % IV SOLN: INTRAVENOUS | Status: DC

## 2019-05-04 MED ORDER — MIDAZOLAM HCL 2 MG/2ML IJ SOLN
INTRAMUSCULAR | Status: DC | PRN
  Administered 2019-05-04: 1 mg via INTRAVENOUS

## 2019-05-04 MED ORDER — HEPARIN (PORCINE) IN NACL 1000-0.9 UT/500ML-% IV SOLN
INTRAVENOUS | Status: DC | PRN
  Administered 2019-05-04 (×2): 500 mL

## 2019-05-04 MED ORDER — IODIXANOL 320 MG/ML IV SOLN
INTRAVENOUS | Status: DC | PRN
  Administered 2019-05-04: 125 mL via INTRA_ARTERIAL

## 2019-05-04 MED ORDER — ATORVASTATIN CALCIUM 10 MG PO TABS: 10.0000 mg | ORAL_TABLET | Freq: Every day | ORAL | Status: DC

## 2019-05-04 MED ORDER — ATORVASTATIN CALCIUM 10 MG PO TABS: 10.0000 mg | ORAL_TABLET | Freq: Every day | ORAL | 11 refills | Status: DC

## 2019-05-04 MED ORDER — SODIUM CHLORIDE 0.9 % IV SOLN: 250.0000 mL | INTRAVENOUS | Status: DC | PRN

## 2019-05-04 MED ORDER — FENTANYL CITRATE (PF) 100 MCG/2ML IJ SOLN
INTRAMUSCULAR | Status: DC | PRN
  Administered 2019-05-04: 50 ug via INTRAVENOUS

## 2019-05-04 MED ORDER — ONDANSETRON HCL 4 MG/2ML IJ SOLN: 4.0000 mg | Freq: Four times a day (QID) | INTRAMUSCULAR | Status: DC | PRN

## 2019-05-04 MED ORDER — LIDOCAINE HCL (PF) 1 % IJ SOLN
INTRAMUSCULAR | Status: DC | PRN
  Administered 2019-05-04: 15 mL

## 2019-05-04 MED ORDER — SODIUM CHLORIDE 0.9 % WEIGHT BASED INFUSION: 1.0000 mL/kg/h | INTRAVENOUS | Status: DC

## 2019-05-04 MED ORDER — SODIUM CHLORIDE 0.9% FLUSH: 3.0000 mL | Freq: Two times a day (BID) | INTRAVENOUS | Status: DC

## 2019-05-04 MED ORDER — ASPIRIN EC 81 MG PO TBEC: 81.0000 mg | DELAYED_RELEASE_TABLET | Freq: Every day | ORAL | Status: DC

## 2019-05-04 MED ORDER — SODIUM CHLORIDE 0.9% FLUSH: 3.0000 mL | INTRAVENOUS | Status: DC | PRN

## 2019-05-04 SURGICAL SUPPLY — 16 items
CATH ANGIO 5F PIGTAIL 65CM (CATHETERS) ×1 IMPLANT
CATH CROSS OVER TEMPO 5F (CATHETERS) ×2 IMPLANT
CATH NAVICROSS ANG 65CM (CATHETERS) IMPLANT
CATH STRAIGHT 5FR 65CM (CATHETERS) ×2 IMPLANT
CATHETER NAVICROSS ANG 65CM (CATHETERS) ×2
GUIDEWIRE ANGLED .035X150CM (WIRE) ×2 IMPLANT
KIT MICROPUNCTURE NIT STIFF (SHEATH) ×2 IMPLANT
KIT PV (KITS) ×2 IMPLANT
SHEATH PINNACLE 5F 10CM (SHEATH) ×2 IMPLANT
SHEATH PROBE COVER 6X72 (BAG) ×1 IMPLANT
STOPCOCK MORSE 400PSI 3WAY (MISCELLANEOUS) ×2 IMPLANT
SYR MEDRAD MARK 7 150ML (SYRINGE) ×2 IMPLANT
TRANSDUCER W/STOPCOCK (MISCELLANEOUS) ×2 IMPLANT
TRAY PV CATH (CUSTOM PROCEDURE TRAY) ×2 IMPLANT
TUBING CIL FLEX 10 FLL-RA (TUBING) ×2 IMPLANT
WIRE J 3MM .035X145CM (WIRE) ×2 IMPLANT

## 2019-05-04 NOTE — Interval H&P Note (Signed)
History and Physical Interval Note:  05/04/2019 10:14 AM  Alexis [name]  has presented today for surgery, with the diagnosis of PVD.  The various methods of treatment have been discussed with the patient and family. After consideration of risks, benefits and other options for treatment, the patient has consented to  Procedure(s): ABDOMINAL AORTOGRAM W/LOWER EXTREMITY (Bilateral) as a surgical intervention.  The patient's history has been reviewed, patient examined, no change in status, stable for surgery.  I have reviewed the patient's chart and labs.  Questions were answered to the patient's satisfaction.     [name]

## 2019-05-04 NOTE — Discharge Instructions (Signed)
Femoral Site Care This sheet gives you information about how to care for yourself after your procedure. Your health care provider may also give you more specific instructions. If you have problems or questions, contact your health care provider. What can I expect after the procedure? After the procedure, it is common to have:  Bruising that usually fades within 1-2 weeks.  Tenderness at the site. Follow these instructions at home: Wound care  Follow instructions from your health care provider about how to take care of your insertion site. Make sure you: ? Wash your hands with soap and water before you change your bandage (dressing). If soap and water are not available, use hand sanitizer. ? Change your dressing as told by your health care provider. ? Leave stitches (sutures), skin glue, or adhesive strips in place. These skin closures may need to stay in place for 2 weeks or longer. If adhesive strip edges start to loosen and curl up, you may trim the loose edges. Do not remove adhesive strips completely unless your health care provider tells you to do that.  Do not take baths, swim, or use a hot tub until your health care provider approves.  You may shower 24-48 hours after the procedure or as told by your health care provider. ? Gently wash the site with plain soap and water. ? Pat the area dry with a clean towel. ? Do not rub the site. This may cause bleeding.  Do not apply powder or lotion to the site. Keep the site clean and dry.  Check your femoral site every day for signs of infection. Check for: ? Redness, swelling, or pain. ? Fluid or blood. ? Warmth. ? Pus or a bad smell. Activity  For the first 2-3 days after your procedure, or as long as directed: ? Avoid climbing stairs as much as possible. ? Do not squat.  Do not lift anything that is heavier than 10 lb (4.5 kg), or the limit that you are told, until your health care provider says that it is safe.  Rest as  directed. ? Avoid sitting for a long time without moving. Get up to take short walks every 1-2 hours.  Do not drive for 24 hours if you were given a medicine to help you relax (sedative). General instructions  Take over-the-counter and prescription medicines only as told by your health care provider.  Keep all follow-up visits as told by your health care provider. This is important. Contact a health care provider if you have:  A fever or chills.  You have redness, swelling, or pain around your insertion site. Get help right away if:  The catheter insertion area swells very fast.  You pass out.  You suddenly start to sweat or your skin gets clammy.  The catheter insertion area is bleeding, and the bleeding does not stop when you hold steady pressure on the area.  The area near or just beyond the catheter insertion site becomes pale, cool, tingly, or numb. These symptoms may represent a serious problem that is an emergency. Do not wait to see if the symptoms will go away. Get medical help right away. Call your local emergency services (911 in the [place] Do not drive yourself to the hospital. Summary  After the procedure, it is common to have bruising that usually fades within 1-2 weeks.  Check your femoral site every day for signs of infection.  Do not lift anything that is heavier than 10 lb (4.5 kg), or the   limit that you are told, until your health care provider says that it is safe. This information is not intended to replace advice given to you by your health care provider. Make sure you discuss any questions you have with your health care provider. Document Revised: 02/28/2017 Document Reviewed: 02/28/2017 Elsevier Patient Education  2020 Elsevier Inc.  

## 2019-05-04 NOTE — Progress Notes (Signed)
Discharge instructions reviewed with pt and her daughter (via telephone) both voice understanding.

## 2019-05-04 NOTE — Progress Notes (Signed)
Pat called and informed of potassium level and that we will redraw

## 2019-05-04 NOTE — Progress Notes (Signed)
Lower extremity vein mapping has been completed.   Preliminary results in CV Proc.   Alexis [name] 05/04/2019 1:47 PM

## 2019-05-04 NOTE — Progress Notes (Signed)
Ambulated to bathroom and in hallway. Groin site benign before and after ambulation. Tol well

## 2019-05-04 NOTE — Op Note (Signed)
PATIENT: Alexis [name]      MRN: [id number] DOB: [birth year]    DATE OF PROCEDURE: 05/04/2019  INDICATIONS:    Alexis [name] is a 69 y.o. female who presents with critical limb ischemia.  She has a gangrenous wound in her right great toe and also a wound on her heel.  She is undergone previous atherectomy and angioplasty of the right lower extremity.  She was seen in the office 2 days ago with markedly diminished flow in the right foot and clearly limb threatening ischemia.  She is brought in for arteriography.  PROCEDURE:    1.  Conscious sedation 2.  Ultrasound-guided access to the left common femoral artery 3.  Aortogram with bilateral iliac arteriogram 4.  Selective catheterization of the right external iliac artery with right lower extremity runoff 5.  Retrograde left femoral arteriogram  SURGEON: Judeth Cornfield. Scot Dock, MD, FACS  ANESTHESIA: Local with sedation  EBL: Minimal  TECHNIQUE:  The patient was brought to the peripheral vascular lab and was sedated. The period of conscious sedation was 43 minutes.  During that time period, I was present face-to-face 100% of the time.  The patient was administered 1 mg of Versed and 50 mcg of fentanyl. The patient's heart rate, blood pressure, and oxygen saturation were monitored by the nurse continuously during the procedure.  Both groins were prepped and draped in the usual sterile fashion.  Under ultrasound guidance, after the skin was anesthetized, I cannulated the left common femoral artery with a micropuncture needle and a micropuncture sheath was introduced over a wire.  This was exchanged for a 5 [place] sheath over a Bentson wire.  By ultrasound the femoral artery was patent. A real-time image was obtained and sent to the server.  The pigtail catheter was positioned at the L1 vertebral body.  Flush aortogram was obtained.  The catheter was in position above the aortic bifurcation and an oblique iliac projection was obtained.  Next  the pigtail catheter was exchanged for a crossover catheter which was positioned into the right common iliac artery.  The wire was advanced into the external iliac artery and the crossover catheter exchanged for a Nava 6 catheter which was positioned in the distal right external iliac artery.  Selective right external iliac arteriogram was obtained with right lower extremity runoff.  This catheter was then removed and a retrograde left femoral arteriogram obtained.  The sheath was then removed and pressure held for hemostasis.  No immediate complications were noted.  FINDINGS:   1.  There are single renal arteries bilaterally with no significant renal artery stenosis identified. 2.  On the right side, which is the side of concern, there is no significant inflow disease.  The common iliac external iliac and hypogastric arteries are patent. 3. On the right side, the patient has severe infrainguinal arterial occlusive disease.  The common femoral and deep femoral artery are patent.  There is mild to moderate diffuse disease in the proximal superficial femoral artery with an occlusion at the adductor canal and disease in the popliteal artery below that.  There is reconstitution of a diseased tibial peroneal trunk.  The peroneal artery is patent with moderate disease in the posterior tibial artery is small but patent.  The anterior tibial artery is occluded. 4.  On the left side, there is no significant inflow disease.  The common iliac, external iliac, and hypogastric arteries are patent. 5.  On the left side, there is severe infrainguinal arterial occlusive  disease.  The common femoral and deep femoral artery are patent.  There is moderate disease of the superficial femoral artery throughout with severe disease distally.  It appears that the below-knee popliteal artery is patent with distal stenosis distally.  The anterior tibial is occluded.  There appears to be single-vessel runoff on the left via the  peroneal artery.  CLINICAL NOTE: This patient clearly has a limb threatening situation on the right foot.  Without revascularization she will require below the knee or above the knee amputation.  Her only options for revascularization would be a right femoral to posterior tibial bypass versus a retrograde endovascular approach.  Both would be associated with significant increased risk.  I have also explained that even with revascularization given the extent of the wounds she is at high risk for limb loss.  I will obtain a vein map of her right great saphenous vein.  If she has a nice vein for bypass then certainly this could be an option.  If the vein is not adequate then I would not recommend placing a prosthetic graft given her diabetes and obesity.  In addition she has a significant cardiac history and we will have to arrange preoperative cardiac evaluation by [facility].   Alexis Mayo, MD, FACS [facility]  DATE OF DICTATION:   05/04/2019

## 2019-05-07 ENCOUNTER — Other Ambulatory Visit: Payer: Self-pay | Admitting: *Deleted

## 2019-05-07 LAB — POCT I-STAT, CHEM 8
BUN: 91 mg/dL — ABNORMAL HIGH (ref 8–23)
Calcium, Ion: 1.14 mmol/L — ABNORMAL LOW (ref 1.15–1.40)
Chloride: 97 mmol/L — ABNORMAL LOW (ref 98–111)
Creatinine, Ser: 1.2 mg/dL — ABNORMAL HIGH (ref 0.44–1.00)
Glucose, Bld: 173 mg/dL — ABNORMAL HIGH (ref 70–99)
HCT: 47 % — ABNORMAL HIGH (ref 36.0–46.0)
Hemoglobin: 16 g/dL — ABNORMAL HIGH (ref 12.0–15.0)
Potassium: 6.4 mmol/L (ref 3.5–5.1)
Sodium: 131 mmol/L — ABNORMAL LOW (ref 135–145)
TCO2: 31 mmol/L (ref 22–32)

## 2019-05-07 NOTE — Progress Notes (Signed)
Your blood work showed elevated potassium but study showed hemolysis which can falesly elevate potassium. Looks like they repeated the study and potassium came back down and slightly low. Better to be slightly low compared to real high.

## 2019-05-07 NOTE — Patient Outreach (Signed)
[facility]) Care Management  05/07/2019  MARKIESHA DELIA April 06, 1950 [id number]   [place] called patient to follow-up on home modifications that patient was working on with [employer], [employer]. Patient states that she still wants a tub modification and has been in touch with [employer] with the [facility] ([email or web address]  ph#: [phone]). Patient also states that she has a friend that recently moved into a nursing home and has offered to give the ramp that was provided to her by the [place] to patient, but would need help breaking it down and installing it at her house. Patient provided the phone number to [facility] (ph#: [phone]). Patient informed CSW that she was told that she would need to have a portion of her leg amputated and was asking about help with the expense of her copay. CSW informed patient that [facility] is unable to help with copay but we would be able to send her a $25 giftcard to help out with groceries to offset the cost of her copay. Patient was very Patent attorney.    Raynaldo Opitz, LCSW [facility]  Clinical Social Worker cell #: [phone]

## 2019-05-08 ENCOUNTER — Other Ambulatory Visit: Payer: Self-pay

## 2019-05-08 ENCOUNTER — Ambulatory Visit: Payer: HMO | Admitting: Podiatry

## 2019-05-08 VITALS — Temp 97.8°F

## 2019-05-08 DIAGNOSIS — I96 Gangrene, not elsewhere classified: Secondary | ICD-10-CM

## 2019-05-08 DIAGNOSIS — L97329 Non-pressure chronic ulcer of left ankle with unspecified severity: Secondary | ICD-10-CM

## 2019-05-08 DIAGNOSIS — I739 Peripheral vascular disease, unspecified: Secondary | ICD-10-CM

## 2019-05-08 DIAGNOSIS — L97411 Non-pressure chronic ulcer of right heel and midfoot limited to breakdown of skin: Secondary | ICD-10-CM

## 2019-05-09 ENCOUNTER — Telehealth: Payer: Self-pay | Admitting: Medical

## 2019-05-09 ENCOUNTER — Encounter: Payer: Self-pay | Admitting: Medical

## 2019-05-09 DIAGNOSIS — Z794 Long term (current) use of insulin: Secondary | ICD-10-CM | POA: Diagnosis not present

## 2019-05-09 DIAGNOSIS — I739 Peripheral vascular disease, unspecified: Secondary | ICD-10-CM | POA: Diagnosis not present

## 2019-05-09 DIAGNOSIS — E1142 Type 2 diabetes mellitus with diabetic polyneuropathy: Secondary | ICD-10-CM | POA: Diagnosis not present

## 2019-05-09 DIAGNOSIS — R269 Unspecified abnormalities of gait and mobility: Secondary | ICD-10-CM | POA: Diagnosis not present

## 2019-05-09 DIAGNOSIS — J449 Chronic obstructive pulmonary disease, unspecified: Secondary | ICD-10-CM | POA: Diagnosis not present

## 2019-05-09 NOTE — Telephone Encounter (Signed)
Verbal order for PT ok.

## 2019-05-09 NOTE — Progress Notes (Signed)
Subjective:   Patient ID: Alexis [name], female   DOB: 69 y.o.   MRN: [id number]   HPI 69 year old female presents the office today for new patient consultation given peripheral arterial disease, gangrene and ulcerations to her right foot.  She has been under the care of vascular surgery and she is awaiting bypass.  Cardiovascular work-up.  She has noticed a wound present on her heel and some dry skin which has been try to get off her self.  Also she has noticed gangrene to her big toe.  She currently denies any drainage or pus.  She has been on oral antibiotics recently.  No increase in swelling or redness to her foot.  She denies any fevers, chills, nausea, vomiting.  No calf pain, chest pain, shortness of breath currently.   Review of Systems  All other systems reviewed and are negative.  Past Medical History:  Diagnosis Date  . Allergy   . Anxiety   . Arthritis   . Asthma   . Atrial fibrillation ([place])   . CHF (congestive heart failure) ([place])   . Cirrhosis ([place])   . COPD (chronic obstructive pulmonary disease) ([place])   . Diabetes mellitus without complication ([place])   . Hyperlipidemia   . Hypertension   . Macrocytic anemia 09/04/2018  . Myocardial infarction ([place])    several  . Pacemaker    CRT-P with RV lead and His Bundle lead ( high threshold)   . Peripheral neuropathy   . Pneumonia   . PONV (postoperative nausea and vomiting)    unsure of exactly what happened at [facility] in 2010 (knee surgery) that led to crital care  . Thyroid disease     Past Surgical History:  Procedure Laterality Date  . ABDOMINAL AORTOGRAM W/LOWER EXTREMITY N/A 04/29/2016   Procedure: Abdominal Aortogram w/Lower Extremity;  Surgeon: Waynetta Sandy, MD;  Location: [place] CV [facility];  Service: Cardiovascular;  Laterality: N/A;  . ABDOMINAL AORTOGRAM W/LOWER EXTREMITY N/A 05/06/2017   Procedure: ABDOMINAL AORTOGRAM W/LOWER EXTREMITY;  Surgeon: Angelia Mould, MD;  Location:  [place] CV [facility];  Service: Cardiovascular;  Laterality: N/A;  bilateral  . ABDOMINAL AORTOGRAM W/LOWER EXTREMITY Bilateral 05/04/2019   Procedure: ABDOMINAL AORTOGRAM W/LOWER EXTREMITY;  Surgeon: Angelia Mould, MD;  Location: [place] CV [facility];  Service: Cardiovascular;  Laterality: Bilateral;  . ABDOMINAL HYSTERECTOMY    . AMPUTATION Right 07/30/2016   Procedure: AMPUTATION RIGHT SECOND TOE;  Surgeon: Angelia Mould, MD;  Location: [place];  Service: Vascular;  Laterality: Right;  . CARDIAC CATHETERIZATION     2005 at [place] N/A 12/25/2018   Procedure: COLONOSCOPY WITH PROPOFOL;  Surgeon: Jackquline Denmark, MD;  Location: [facility];  Service: Endoscopy;  Laterality: N/A;  . ESOPHAGEAL BANDING  12/25/2018   Procedure: ESOPHAGEAL BANDING;  Surgeon: Jackquline Denmark, MD;  Location: [facility];  Service: Endoscopy;;  . ESOPHAGEAL BANDING  05/01/2019   Procedure: ESOPHAGEAL BANDING;  Surgeon: Jackquline Denmark, MD;  Location: [facility];  Service: Endoscopy;;  . ESOPHAGOGASTRODUODENOSCOPY (EGD) WITH PROPOFOL N/A 12/25/2018   Procedure: ESOPHAGOGASTRODUODENOSCOPY (EGD) WITH PROPOFOL;  Surgeon: Jackquline Denmark, MD;  [facility]: [facility];  Service: Endoscopy;  Laterality: N/A;  . ESOPHAGOGASTRODUODENOSCOPY (EGD) WITH PROPOFOL N/A 05/01/2019   Procedure: ESOPHAGOGASTRODUODENOSCOPY (EGD) WITH PROPOFOL;  Surgeon: Jackquline Denmark, MD;  [facility]: [facility];  Service: Endoscopy;  Laterality: N/A;  . PERIPHERAL VASCULAR ATHERECTOMY Right 04/29/2016   Procedure: Peripheral Vascular Atherectomy-Right Popliteal;  Surgeon:  Waynetta Sandy, MD;  Location: [place] CV [facility];  Service: Cardiovascular;  Laterality: Right;  . PERIPHERAL VASCULAR INTERVENTION Right 04/29/2016   Procedure: Peripheral Vascular Intervention-Right Popliteal;  Surgeon: Waynetta Sandy, MD;  Location: [place] CV [facility];  Service: Cardiovascular;  Laterality: Right;  POPLITEAL  PTA  . RADIOFREQUENCY ABLATION    . TOTAL KNEE ARTHROPLASTY    . TUBAL LIGATION       Current Outpatient Medications:  .  acetaminophen (TYLENOL) 325 MG tablet, Take 650 mg by mouth every 6 (six) hours as needed for moderate pain or headache., Disp: , Rfl:  .  albuterol (VENTOLIN HFA) 108 (90 Base) MCG/ACT inhaler, Inhale 2 puffs into the lungs every 6 (six) hours as needed for wheezing or shortness of breath., Disp: , Rfl:  .  allopurinol (ZYLOPRIM) 100 MG tablet, TAKE ONE TABLET BY MOUTH TWICE DAILY (Patient taking differently: Take 100 mg by mouth 2 (two) times daily. ), Disp: 60 tablet, Rfl: 3 .  AMBULATORY NON FORMULARY MEDICATION, Motorized scooter.  Diagnosis: Osteoarthritis M19.90, Disp: 1 each, Rfl: 0 .  atorvastatin (LIPITOR) 10 MG tablet, Take 1 tablet (10 mg total) by mouth daily., Disp: 30 tablet, Rfl: 11 .  azelastine (ASTELIN) 0.1 % nasal spray, Place 2 sprays into both nostrils 2 (two) times daily. Use in each nostril as directed, Disp: , Rfl:  .  azithromycin (ZITHROMAX) 250 MG tablet, TAKE TABLETS BY MOUTH AS DIRECTED 2 TABS DAY 1 AND 1 TAB DAYS 2 THRU 5, Disp: , Rfl:  .  Continuous Blood Gluc Sensor (FREESTYLE LIBRE 14 DAY SENSOR) MISC, , Disp: , Rfl:  .  desvenlafaxine (PRISTIQ) 50 MG 24 hr tablet, Take 50 mg by mouth daily., Disp: , Rfl:  .  dicloxacillin (DYNAPEN) 500 MG capsule, Take 1 capsule (500 mg total) by mouth 3 (three) times daily., Disp: 30 capsule, Rfl: 0 .  fluticasone (FLONASE) 50 MCG/ACT nasal spray, Place 2 sprays into both nostrils at bedtime. (Patient taking differently: Place 2 sprays into both nostrils in the morning and at bedtime. ), Disp: , Rfl:  .  furosemide (LASIX) 20 MG tablet, Take 2 tables in the morning and 1 tablet at noon (Patient taking differently: Take 20-40 mg by mouth daily. Take 40 mg in the morning and 20 mg in the afternoon), Disp: 270 tablet, Rfl: 1 .  insulin aspart (NOVOLOG) 100 UNIT/ML injection, Inject 10-21 Units into the skin 3  (three) times daily as needed for high blood sugar., Disp: , Rfl:  .  insulin degludec (TRESIBA) 100 UNIT/ML FlexTouch Pen, Inject as directed per sliding scale.  MDD: 50 units/day, Disp: , Rfl:  .  Insulin Pen Needle (PEN NEEDLES 31GX5/16") 31G X 8 MM MISC, Use as needed 4 times/day.  Dx E11.65, Disp: , Rfl:  .  ipratropium-albuterol (DUONEB) 0.5-2.5 (3) MG/3ML SOLN, SMARTSIG:3 Milliliter(s) Via Inhaler Every 6 Hours, Disp: , Rfl:  .  levocetirizine (XYZAL) 5 MG tablet, Take 5 mg by mouth at bedtime., Disp: , Rfl:  .  Levothyroxine Sodium 150 MCG CAPS, Take 1 capsule (150 mcg total) by mouth daily before breakfast., Disp: 30 capsule, Rfl: 3 .  Lidocaine HCl 4 % LIQD, Apply 10 mLs topically 2 (two) times daily. (Patient taking differently: Apply 10 mLs topically 2 (two) times daily as needed (pain). ), Disp: 73 mL, Rfl: 0 .  LORazepam (ATIVAN) 0.5 MG tablet, Take 1 tablet (0.5 mg total) by mouth every 8 (eight) hours as needed for anxiety., Disp:  15 tablet, Rfl: 0 .  losartan (COZAAR) 25 MG tablet, TAKE ONE (1) TABLET BY MOUTH EVERY DAY (Patient taking differently: Take 25 mg by mouth daily. ), Disp: 90 tablet, Rfl: 0 .  metoprolol succinate (TOPROL-XL) 25 MG 24 hr tablet, TAKE ONE (1) TABLET BY MOUTH EACH DAY (Patient taking differently: Take 25 mg by mouth daily. ), Disp: 90 tablet, Rfl: 1 .  ondansetron (ZOFRAN-ODT) 4 MG disintegrating tablet, Take 4 mg by mouth every 8 (eight) hours as needed., Disp: , Rfl:  .  predniSONE (DELTASONE) 20 MG tablet, Take 40 mg by mouth daily., Disp: , Rfl:  .  Prenatal Vit-Fe Fumarate-FA (MULTIVITAMIN-PRENATAL) 27-0.8 MG TABS tablet, Take 1 tablet by mouth daily. , Disp: , Rfl:  .  psyllium (METAMUCIL SMOOTH TEXTURE) 58.6 % powder, Take 1 packet by mouth daily. (Patient taking differently: Take 1 packet by mouth daily as needed (constipation). ), Disp: 283 g, Rfl: 12 .  RABEprazole (ACIPHEX) 20 MG tablet, Take 1 tablet (20 mg total) by mouth daily., Disp: 30 tablet,  Rfl: 1 .  spironolactone (ALDACTONE) 50 MG tablet, Take 1 tablet (50 mg total) by mouth daily., Disp: 30 tablet, Rfl: 11  Allergies  Allergen Reactions  . Indomethacin Other (See Comments)    Renal Insufficiency  . Pregabalin Other (See Comments)    DIZZINESS   . Sulfa Antibiotics Hives  . Morphine And Related Nausea And Vomiting          Objective:  Physical Exam  General: AAO x3, NAD  Dermatological: Hyperkeratotic lesion on the medial aspect of the right heel with ulceration measures 0.3 x 0.3 x 0.2 cm with fibrotic tissue.  No surrounding erythema, ascending cellulitis.  No fluctuation crepitation.  There is no malodor.  Gangrenous changes present the hallux.  Ischemic changes are present to the right foot.  On the left anterior ankle is a superficial wound from where I saw corrupt.  No signs of infection.              Vascular: Nonpalpable pulses  Neruologic: Sensation decreased with Semmes Weinstein monofilament  Musculoskeletal: Hammertoes present       Assessment:   Gangrene right hallux with ulceration, peripheral arterial disease     Plan:  -Treatment options discussed including all alternatives, risks, and complications -Etiology of symptoms were discussed -She previous x-rays and declined further x-rays.  Unfortunate she is a very high risk of limb loss.  She is awaiting bypass pending cardio workout.  To the heel wound recommended a small amount of antibiotic ointment.  For the hallux keep the area clean and dry.  On the left anterior ankle small amount of antibiotic ointment as well.  Monitor closely for any signs or symptoms of infection instructed to report to the emergency room certain color.  Return in about 10 days (around 05/18/2019).  Trula Slade DPM

## 2019-05-09 NOTE — Telephone Encounter (Signed)
    Caller/Agency:Betsy, From [facility]  Callback Number: [phone] Requesting PT Frequency: 2 times a week For 3weeks

## 2019-05-09 NOTE — Telephone Encounter (Signed)
Is it okay to give the VO ?

## 2019-05-09 NOTE — Telephone Encounter (Signed)
Betsy from [facility] notified PT is ok

## 2019-05-10 ENCOUNTER — Telehealth: Payer: Self-pay | Admitting: Medical

## 2019-05-10 ENCOUNTER — Ambulatory Visit: Payer: HMO

## 2019-05-10 ENCOUNTER — Encounter: Payer: Self-pay | Admitting: Medical

## 2019-05-10 ENCOUNTER — Other Ambulatory Visit: Payer: Self-pay

## 2019-05-10 ENCOUNTER — Telehealth: Payer: Self-pay | Admitting: Podiatry

## 2019-05-10 ENCOUNTER — Telehealth: Payer: Self-pay

## 2019-05-10 DIAGNOSIS — G629 Polyneuropathy, unspecified: Secondary | ICD-10-CM

## 2019-05-10 DIAGNOSIS — R269 Unspecified abnormalities of gait and mobility: Secondary | ICD-10-CM

## 2019-05-10 DIAGNOSIS — I739 Peripheral vascular disease, unspecified: Secondary | ICD-10-CM

## 2019-05-10 NOTE — Telephone Encounter (Signed)
Order already faxed.

## 2019-05-10 NOTE — Telephone Encounter (Signed)
See my chart message

## 2019-05-10 NOTE — Telephone Encounter (Signed)
Is it ok to give VO ?

## 2019-05-10 NOTE — Telephone Encounter (Signed)
Patient nurse Danae Chen  called in to get verbal orders for weight parameter. If there is any questions please call Danae Chen at [phone]   Thanks,

## 2019-05-10 NOTE — Patient Outreach (Signed)
  [facility]) Care Management Chronic Special Needs Program    05/10/2019  Name: CARLEENA MIRES, DOB: [birth year]  MRN: [id number]   Ms. Doniqua Saxby is enrolled in a chronic special needs plan for Diabetes. Telephone call to client for assessment follow up. Unable to reach. HIPAA compliant voice message left with call back phone number and return call request.   PLAN; RNCM will attempt 2nd  telephone call to client within 1 week.     Quinn Plowman RN,BSN,CCM [place] [facility] [phone]

## 2019-05-10 NOTE — Telephone Encounter (Signed)
Will you get help from shekeitha to print or send rx of that transport wheel chair.

## 2019-05-10 NOTE — Telephone Encounter (Signed)
This encounter was created in error - please disregard.

## 2019-05-10 NOTE — Telephone Encounter (Signed)
I informed [facility] - Danae Chen of the 05/08/2019 clinical plan for wound care.

## 2019-05-10 NOTE — Telephone Encounter (Signed)
Called and spoke with patient.  Rx rewrriten that she has neuropathy and faxed back to [place] so she can get the correct wheelchair.

## 2019-05-10 NOTE — Telephone Encounter (Signed)
Patient would like to discuss her wheelchair. Patient it is the wrong wheelchair

## 2019-05-10 NOTE — Telephone Encounter (Signed)
Please call home health with instructions about treating patients heel. She wanted to make sure all the info that the patient received was correct.

## 2019-05-10 NOTE — Telephone Encounter (Signed)
error 

## 2019-05-11 ENCOUNTER — Other Ambulatory Visit: Payer: Self-pay

## 2019-05-11 ENCOUNTER — Other Ambulatory Visit: Payer: Self-pay | Admitting: Medical

## 2019-05-11 ENCOUNTER — Encounter: Payer: Self-pay | Admitting: Medical

## 2019-05-11 ENCOUNTER — Other Ambulatory Visit: Payer: Self-pay | Admitting: Cardiology

## 2019-05-11 DIAGNOSIS — M109 Gout, unspecified: Secondary | ICD-10-CM

## 2019-05-11 NOTE — Telephone Encounter (Signed)
Is it okay to give VO for weight parameter

## 2019-05-12 ENCOUNTER — Other Ambulatory Visit (HOSPITAL_COMMUNITY)
Admission: RE | Admit: 2019-05-12 | Discharge: 2019-05-12 | Disposition: A | Discharge status: Home / Self Care | Payer: HMO | Source: Ambulatory Visit | Attending: Orthopaedic Surgery | Admitting: Orthopaedic Surgery

## 2019-05-12 DIAGNOSIS — Z20822 Contact with and (suspected) exposure to covid-19: Secondary | ICD-10-CM | POA: Diagnosis not present

## 2019-05-12 DIAGNOSIS — Z01812 Encounter for preprocedural laboratory examination: Secondary | ICD-10-CM | POA: Insufficient documentation

## 2019-05-12 LAB — SARS CORONAVIRUS 2 (TAT 6-24 HRS): SARS Coronavirus 2: NEGATIVE

## 2019-05-13 ENCOUNTER — Encounter: Payer: Self-pay | Admitting: Medical

## 2019-05-14 ENCOUNTER — Telehealth: Payer: Self-pay | Admitting: Medical

## 2019-05-14 ENCOUNTER — Telehealth: Payer: HMO

## 2019-05-14 ENCOUNTER — Ambulatory Visit: Payer: Self-pay

## 2019-05-14 ENCOUNTER — Telehealth: Payer: Self-pay | Admitting: Vascular Surgery

## 2019-05-14 ENCOUNTER — Other Ambulatory Visit: Payer: Self-pay

## 2019-05-14 ENCOUNTER — Ambulatory Visit (HOSPITAL_COMMUNITY)
Admission: RE | Admit: 2019-05-14 | Discharge: 2019-05-14 | Disposition: A | Discharge status: Home / Self Care | Payer: HMO | Source: Ambulatory Visit | Attending: Vascular Surgery | Admitting: Vascular Surgery

## 2019-05-14 ENCOUNTER — Encounter (HOSPITAL_COMMUNITY)
Admission: RE | Disposition: A | Discharge status: Home / Self Care | Payer: Self-pay | Source: Ambulatory Visit | Attending: Vascular Surgery

## 2019-05-14 ENCOUNTER — Encounter: Payer: Self-pay | Admitting: Medical

## 2019-05-14 DIAGNOSIS — Z8249 Family history of ischemic heart disease and other diseases of the circulatory system: Secondary | ICD-10-CM | POA: Diagnosis not present

## 2019-05-14 DIAGNOSIS — I70234 Atherosclerosis of native arteries of right leg with ulceration of heel and midfoot: Secondary | ICD-10-CM | POA: Diagnosis not present

## 2019-05-14 DIAGNOSIS — Z79890 Hormone replacement therapy (postmenopausal): Secondary | ICD-10-CM | POA: Diagnosis not present

## 2019-05-14 DIAGNOSIS — L97519 Non-pressure chronic ulcer of other part of right foot with unspecified severity: Secondary | ICD-10-CM | POA: Insufficient documentation

## 2019-05-14 DIAGNOSIS — K7460 Unspecified cirrhosis of liver: Secondary | ICD-10-CM | POA: Diagnosis not present

## 2019-05-14 DIAGNOSIS — Z79899 Other long term (current) drug therapy: Secondary | ICD-10-CM | POA: Diagnosis not present

## 2019-05-14 DIAGNOSIS — Z950 Presence of cardiac pacemaker: Secondary | ICD-10-CM | POA: Insufficient documentation

## 2019-05-14 DIAGNOSIS — E079 Disorder of thyroid, unspecified: Secondary | ICD-10-CM | POA: Insufficient documentation

## 2019-05-14 DIAGNOSIS — E1151 Type 2 diabetes mellitus with diabetic peripheral angiopathy without gangrene: Secondary | ICD-10-CM | POA: Diagnosis not present

## 2019-05-14 DIAGNOSIS — E1140 Type 2 diabetes mellitus with diabetic neuropathy, unspecified: Secondary | ICD-10-CM | POA: Insufficient documentation

## 2019-05-14 DIAGNOSIS — Z885 Allergy status to narcotic agent status: Secondary | ICD-10-CM | POA: Insufficient documentation

## 2019-05-14 DIAGNOSIS — Z794 Long term (current) use of insulin: Secondary | ICD-10-CM | POA: Insufficient documentation

## 2019-05-14 DIAGNOSIS — I4891 Unspecified atrial fibrillation: Secondary | ICD-10-CM | POA: Diagnosis not present

## 2019-05-14 DIAGNOSIS — E11621 Type 2 diabetes mellitus with foot ulcer: Secondary | ICD-10-CM | POA: Diagnosis not present

## 2019-05-14 DIAGNOSIS — I252 Old myocardial infarction: Secondary | ICD-10-CM | POA: Insufficient documentation

## 2019-05-14 DIAGNOSIS — J449 Chronic obstructive pulmonary disease, unspecified: Secondary | ICD-10-CM | POA: Diagnosis not present

## 2019-05-14 DIAGNOSIS — Z882 Allergy status to sulfonamides status: Secondary | ICD-10-CM | POA: Diagnosis not present

## 2019-05-14 DIAGNOSIS — I110 Hypertensive heart disease with heart failure: Secondary | ICD-10-CM | POA: Insufficient documentation

## 2019-05-14 DIAGNOSIS — I70261 Atherosclerosis of native arteries of extremities with gangrene, right leg: Secondary | ICD-10-CM | POA: Diagnosis not present

## 2019-05-14 DIAGNOSIS — Z87891 Personal history of nicotine dependence: Secondary | ICD-10-CM | POA: Diagnosis not present

## 2019-05-14 DIAGNOSIS — L97419 Non-pressure chronic ulcer of right heel and midfoot with unspecified severity: Secondary | ICD-10-CM | POA: Insufficient documentation

## 2019-05-14 DIAGNOSIS — E785 Hyperlipidemia, unspecified: Secondary | ICD-10-CM | POA: Insufficient documentation

## 2019-05-14 DIAGNOSIS — Z833 Family history of diabetes mellitus: Secondary | ICD-10-CM | POA: Insufficient documentation

## 2019-05-14 DIAGNOSIS — I509 Heart failure, unspecified: Secondary | ICD-10-CM | POA: Diagnosis not present

## 2019-05-14 HISTORY — PX: PERIPHERAL VASCULAR ATHERECTOMY: CATH118256

## 2019-05-14 HISTORY — PX: PERIPHERAL VASCULAR INTERVENTION: CATH118257

## 2019-05-14 HISTORY — PX: LOWER EXTREMITY ANGIOGRAPHY: CATH118251

## 2019-05-14 LAB — POCT I-STAT, CHEM 8
BUN: 61 mg/dL — ABNORMAL HIGH (ref 8–23)
Calcium, Ion: 1.22 mmol/L (ref 1.15–1.40)
Chloride: 98 mmol/L (ref 98–111)
Creatinine, Ser: 1 mg/dL (ref 0.44–1.00)
Glucose, Bld: 151 mg/dL — ABNORMAL HIGH (ref 70–99)
HCT: 44 % (ref 36.0–46.0)
Hemoglobin: 15 g/dL (ref 12.0–15.0)
Potassium: 5.3 mmol/L — ABNORMAL HIGH (ref 3.5–5.1)
Sodium: 131 mmol/L — ABNORMAL LOW (ref 135–145)
TCO2: 28 mmol/L (ref 22–32)

## 2019-05-14 LAB — GLUCOSE, CAPILLARY: Glucose-Capillary: 157 mg/dL — ABNORMAL HIGH (ref 70–99)

## 2019-05-14 LAB — POCT ACTIVATED CLOTTING TIME: Activated Clotting Time: 312 seconds

## 2019-05-14 SURGERY — LOWER EXTREMITY ANGIOGRAPHY
Anesthesia: LOCAL | Laterality: Right

## 2019-05-14 MED ORDER — IODIXANOL 320 MG/ML IV SOLN
INTRAVENOUS | Status: DC | PRN
  Administered 2019-05-14: 75 mL via INTRA_ARTERIAL

## 2019-05-14 MED ORDER — SODIUM CHLORIDE 0.9% FLUSH: 3.0000 mL | Freq: Two times a day (BID) | INTRAVENOUS | Status: DC

## 2019-05-14 MED ORDER — MIDAZOLAM HCL 2 MG/2ML IJ SOLN
INTRAMUSCULAR | Status: AC
  Filled 2019-05-14: qty 2

## 2019-05-14 MED ORDER — HEPARIN SODIUM (PORCINE) 1000 UNIT/ML IJ SOLN
INTRAMUSCULAR | Status: DC | PRN
  Administered 2019-05-14: 8000 [IU] via INTRAVENOUS

## 2019-05-14 MED ORDER — LIDOCAINE HCL (PF) 1 % IJ SOLN
INTRAMUSCULAR | Status: AC
  Filled 2019-05-14: qty 30

## 2019-05-14 MED ORDER — CLOPIDOGREL BISULFATE 300 MG PO TABS
ORAL_TABLET | ORAL | Status: AC
  Filled 2019-05-14: qty 1

## 2019-05-14 MED ORDER — HEPARIN (PORCINE) IN NACL 1000-0.9 UT/500ML-% IV SOLN
INTRAVENOUS | Status: DC | PRN
  Administered 2019-05-14 (×2): 500 mL

## 2019-05-14 MED ORDER — CLOPIDOGREL BISULFATE 300 MG PO TABS
ORAL_TABLET | ORAL | Status: DC | PRN
  Administered 2019-05-14: 300 mg via ORAL

## 2019-05-14 MED ORDER — CLOPIDOGREL BISULFATE 75 MG PO TABS: 300.0000 mg | ORAL_TABLET | Freq: Once | ORAL | Status: DC

## 2019-05-14 MED ORDER — CLOPIDOGREL BISULFATE 75 MG PO TABS: 75.0000 mg | ORAL_TABLET | Freq: Every day | ORAL | Status: DC

## 2019-05-14 MED ORDER — TRAMADOL HCL 50 MG PO TABS: 50.0000 mg | ORAL_TABLET | Freq: Four times a day (QID) | ORAL | 0 refills | Status: AC | PRN

## 2019-05-14 MED ORDER — OXYCODONE HCL 5 MG PO TABS: 5.0000 mg | ORAL_TABLET | ORAL | Status: DC | PRN

## 2019-05-14 MED ORDER — LABETALOL HCL 5 MG/ML IV SOLN: 10.0000 mg | INTRAVENOUS | Status: DC | PRN

## 2019-05-14 MED ORDER — CLOPIDOGREL BISULFATE 75 MG PO TABS: 75.0000 mg | ORAL_TABLET | Freq: Every day | ORAL | 11 refills | Status: DC

## 2019-05-14 MED ORDER — LIDOCAINE HCL (PF) 1 % IJ SOLN
INTRAMUSCULAR | Status: DC | PRN
  Administered 2019-05-14: 15 mL

## 2019-05-14 MED ORDER — SODIUM CHLORIDE 0.9 % IV SOLN: INTRAVENOUS | Status: DC

## 2019-05-14 MED ORDER — ONDANSETRON HCL 4 MG/2ML IJ SOLN: 4.0000 mg | Freq: Four times a day (QID) | INTRAMUSCULAR | Status: DC | PRN

## 2019-05-14 MED ORDER — HEPARIN SODIUM (PORCINE) 1000 UNIT/ML IJ SOLN
INTRAMUSCULAR | Status: AC
  Filled 2019-05-14: qty 1

## 2019-05-14 MED ORDER — FENTANYL CITRATE (PF) 100 MCG/2ML IJ SOLN
INTRAMUSCULAR | Status: AC
  Filled 2019-05-14: qty 2

## 2019-05-14 MED ORDER — HYDRALAZINE HCL 20 MG/ML IJ SOLN: 5.0000 mg | INTRAMUSCULAR | Status: DC | PRN

## 2019-05-14 MED ORDER — HYDROMORPHONE HCL 1 MG/ML IJ SOLN: 0.5000 mg | INTRAMUSCULAR | Status: DC | PRN

## 2019-05-14 MED ORDER — ACETAMINOPHEN 325 MG PO TABS: 650.0000 mg | ORAL_TABLET | ORAL | Status: DC | PRN

## 2019-05-14 MED ORDER — MIDAZOLAM HCL 2 MG/2ML IJ SOLN
INTRAMUSCULAR | Status: DC | PRN
  Administered 2019-05-14: 2 mg via INTRAVENOUS

## 2019-05-14 MED ORDER — HEPARIN (PORCINE) IN NACL 1000-0.9 UT/500ML-% IV SOLN
INTRAVENOUS | Status: AC
  Filled 2019-05-14: qty 1000

## 2019-05-14 MED ORDER — SODIUM CHLORIDE 0.9 % IV SOLN: 250.0000 mL | INTRAVENOUS | Status: DC | PRN

## 2019-05-14 MED ORDER — SODIUM CHLORIDE 0.9% FLUSH: 3.0000 mL | INTRAVENOUS | Status: DC | PRN

## 2019-05-14 MED ORDER — FENTANYL CITRATE (PF) 100 MCG/2ML IJ SOLN
INTRAMUSCULAR | Status: DC | PRN
  Administered 2019-05-14 (×2): 25 ug via INTRAVENOUS

## 2019-05-14 SURGICAL SUPPLY — 29 items
BALLN COYOTE OTW 3.5X220X150 (BALLOONS) ×3
BALLN COYOTE OTW 4X220X150 (BALLOONS) ×3
BALLOON COYOTE OTW 3.5X220X150 (BALLOONS) ×1 IMPLANT
BALLOON COYOTE OTW 4X220X150 (BALLOONS) ×2 IMPLANT
CATH ANGIO 5F BER2 65CM (CATHETERS) ×3 IMPLANT
CATH AURYON 5FR ATHEREC 1.5 (CATHETERS) ×3 IMPLANT
CATH CXI 2.6F 65 ANG (CATHETERS) ×3
CATH OMNI FLUSH 5F 65CM (CATHETERS) ×3 IMPLANT
CATH QUICKCROSS SUPP .035X90CM (MICROCATHETER) ×3 IMPLANT
CATH SPRT ANG 65X2.3FR PLATN (CATHETERS) ×1 IMPLANT
CLOSURE MYNX CONTROL 6F/7F (Vascular Products) ×2 IMPLANT
DEVICE ONE SNARE 10MM (MISCELLANEOUS) ×2 IMPLANT
GLIDEWIRE ADV .035X260CM (WIRE) ×2 IMPLANT
KIT ENCORE 26 ADVANTAGE (KITS) ×2 IMPLANT
KIT MICROPUNCTURE NIT STIFF (SHEATH) ×2 IMPLANT
KIT PV (KITS) ×3 IMPLANT
PATCH THROMBIX TOPICAL PLAIN (HEMOSTASIS) ×2 IMPLANT
SHEATH MICROPUNCTURE PEDAL 4FR (SHEATH) ×3 IMPLANT
SHEATH PINNACLE 5F 10CM (SHEATH) ×2 IMPLANT
SHEATH PINNACLE 6F 10CM (SHEATH) ×3 IMPLANT
SHEATH PINNACLE MP 6F 45CM (SHEATH) ×2 IMPLANT
SHEATH PROBE COVER 6X72 (BAG) ×2 IMPLANT
STENT TIGRIS 5X60X120 (Permanent Stent) ×2 IMPLANT
SYR MEDRAD MARK V 150ML (SYRINGE) ×2 IMPLANT
TRANSDUCER W/STOPCOCK (MISCELLANEOUS) ×3 IMPLANT
TRAY PV CATH (CUSTOM PROCEDURE TRAY) ×3 IMPLANT
WIRE BENTSON .035X145CM (WIRE) ×2 IMPLANT
WIRE G V18X300CM (WIRE) ×2 IMPLANT
WIRE SPARTACORE .014X300CM (WIRE) ×2 IMPLANT

## 2019-05-14 NOTE — Discharge Instructions (Signed)
Angiogram, Care After This sheet gives you information about how to care for yourself after your procedure. Your doctor may also give you more specific instructions. If you have problems or questions, contact your doctor. Follow these instructions at home: Insertion site care  Follow instructions from your doctor about how to take care of your long, thin tube (catheter) insertion area. Make sure you: ? Wash your hands with soap and water before you change your bandage (dressing). If you cannot use soap and water, use hand sanitizer. ? Change your bandage as told by your doctor. ? Leave stitches (sutures), skin glue, or skin tape (adhesive) strips in place. They may need to stay in place for 2 weeks or longer. If tape strips get loose and curl up, you may trim the loose edges. Do not remove tape strips completely unless your doctor says it is okay.  Do not take baths, swim, or use a hot tub until your doctor says it is okay.  You may shower 24-48 hours after the procedure or as told by your doctor. ? Gently wash the area with plain soap and water. ? Pat the area dry with a clean towel. ? Do not rub the area. This may cause bleeding.  Do not apply powder or lotion to the area. Keep the area clean and dry.  Check your insertion area every day for signs of infection. Check for: ? More redness, swelling, or pain. ? Fluid or blood. ? Warmth. ? Pus or a bad smell. Activity  Rest as told by your doctor, usually for 1-2 days.  Do not lift anything that is heavier than 10 lbs. (4.5 kg) or as told by your doctor.  Do not drive for 24 hours if you were given a medicine to help you relax (sedative).  Do not drive or use heavy machinery while taking prescription pain medicine. General instructions   Go back to your normal activities as told by your doctor, usually in about a week. Ask your doctor what activities are safe for you.  If the insertion area starts to bleed, lie flat and put  pressure on the area. If the bleeding does not stop, get help right away. This is an emergency.  Drink enough fluid to keep your pee (urine) clear or pale yellow.  Take over-the-counter and prescription medicines only as told by your doctor.  Keep all follow-up visits as told by your doctor. This is important. Contact a doctor if:  You have a fever.  You have chills.  You have more redness, swelling, or pain around your insertion area.  You have fluid or blood coming from your insertion area.  The insertion area feels warm to the touch.  You have pus or a bad smell coming from your insertion area.  You have more bruising around the insertion area.  Blood collects in the tissue around the insertion area (hematoma) that may be painful to the touch. Get help right away if:  You have a lot of pain in the insertion area.  The insertion area swells very fast.  The insertion area is bleeding, and the bleeding does not stop after holding steady pressure on the area.  The area near or just beyond the insertion area becomes pale, cool, tingly, or numb. These symptoms may be an emergency. Do not wait to see if the symptoms will go away. Get medical help right away. Call your local emergency services (911 in the [place] Do not drive yourself to the hospital.  Summary  After the procedure, it is common to have bruising and tenderness at the long, thin tube insertion area.  After the procedure, it is important to rest and drink plenty of fluids.  Do not take baths, swim, or use a hot tub until your doctor says it is okay to do so. You may shower 24-48 hours after the procedure or as told by your doctor.  If the insertion area starts to bleed, lie flat and put pressure on the area. If the bleeding does not stop, get help right away. This is an emergency. This information is not intended to replace advice given to you by your health care provider. Make sure you discuss any questions you have  with your health care provider. Document Revised: 01/28/2017 Document Reviewed: 02/10/2016 Elsevier Patient Education  [place].  Clopidogrel tablets What is this medicine? CLOPIDOGREL (kloh PID oh grel) helps to prevent blood clots. This medicine is used to prevent heart attack, stroke, or other vascular events in people who are at high risk. This medicine may be used for other purposes; ask your health care provider or pharmacist if you have questions. COMMON BRAND NAME(S): Plavix What should I tell my health care provider before I take this medicine? They need to know if you have any of the following conditions:  bleeding disorders  bleeding in the brain  having surgery  history of stomach bleeding  an unusual or allergic reaction to clopidogrel, other medicines, foods, dyes, or preservatives  pregnant or trying to get pregnant  breast-feeding How should I use this medicine? Take this medicine by mouth with a glass of water. Follow the directions on the prescription label. You may take this medicine with or without food. If it upsets your stomach, take it with food. Take your medicine at regular intervals. Do not take it more often than directed. Do not stop taking except on your doctor's advice. A special MedGuide will be given to you by the pharmacist with each prescription and refill. Be sure to read this information carefully each time. Talk to your pediatrician regarding the use of this medicine in children. Special care may be needed. Overdosage: If you think you have taken too much of this medicine contact a poison control center or emergency room at once. NOTE: This medicine is only for you. Do not share this medicine with others. What if I miss a dose? If you miss a dose, take it as soon as you can. If it is almost time for your next dose, take only that dose. Do not take double or extra doses. What may interact with this medicine? Do not take this medicine  with the following medications:  dasabuvir; ombitasvir; paritaprevir; ritonavir  defibrotide  selexipag This medicine may also interact with the following medications:  certain medicines that treat or prevent blood clots like warfarin  narcotic medicines for pain  NSAIDs, medicines for pain and inflammation, like ibuprofen or naproxen  repaglinide  SNRIs, medicines for depression, like desvenlafaxine, duloxetine, levomilnacipran, venlafaxine  SSRIs, medicines for depression, like citalopram, escitalopram, fluoxetine, fluvoxamine, paroxetine, sertraline  stomach acid blockers like cimetidine, esomeprazole, omeprazole This list may not describe all possible interactions. Give your health care provider a list of all the medicines, herbs, non-prescription drugs, or dietary supplements you use. Also tell them if you smoke, drink alcohol, or use illegal drugs. Some items may interact with your medicine. What should I watch for while using this medicine? Visit your doctor or health care  professional for regular check-ups. Do not stop taking your medicine unless your doctor tells you to. Notify your doctor or health care professional and seek emergency treatment if you develop breathing problems; changes in vision; chest pain; severe, sudden headache; pain, swelling, warmth in the leg; trouble speaking; sudden numbness or weakness of the face, arm or leg. These can be signs that your condition has gotten worse. If you are going to have surgery or dental work, tell your doctor or health care professional that you are taking this medicine. Certain genetic factors may reduce the effect of this medicine. Your doctor may use genetic tests to determine treatment. Only take aspirin if you are instructed to. Low doses of aspirin are used with this medicine to treat some conditions. Taking aspirin with this medicine can increase your risk of bleeding so you must be careful. Talk to your doctor or  pharmacist if you have questions. What side effects may I notice from receiving this medicine? Side effects that you should report to your doctor or health care professional as soon as possible:  allergic reactions like skin rash, itching or hives, swelling of the face, lips, or tongue  signs and symptoms of bleeding such as bloody or black, tarry stools; red or dark-brown urine; spitting up blood or brown material that looks like coffee grounds; red spots on the skin; unusual bruising or bleeding from the eye, gums, or nose  signs and symptoms of a blood clot such as breathing problems; changes in vision; chest pain; severe, sudden headache; pain, swelling, warmth in the leg; trouble speaking; sudden numbness or weakness of the face, arm or leg  signs and symptoms of low blood sugar such as feeling anxious; confusion; dizziness; increased hunger; unusually weak or tired; increased sweating; shakiness; cold, clammy skin; irritable; headache; blurred vision; fast heartbeat; loss of consciousness Side effects that usually do not require medical attention (report to your doctor or health care professional if they continue or are bothersome):  constipation  diarrhea  headache  upset stomach This list may not describe all possible side effects. Call your doctor for medical advice about side effects. You may report side effects to FDA at 1-800-FDA-1088. Where should I keep my medicine? Keep out of the reach of children. Store at room temperature of 59 to 86 degrees F (15 to 30 degrees C). Throw away any unused medicine after the expiration date. NOTE: This sheet is a summary. It may not cover all possible information. If you have questions about this medicine, talk to your doctor, pharmacist, or health care provider.  2020 Elsevier/Gold Standard (2017-07-18 15:03:38)

## 2019-05-14 NOTE — Telephone Encounter (Signed)
Call pt and have her not fill oxycodone. Verify she won't fill. Then I can send over tramadol rx.  Just checked and it looks like oxycodone is pending presciption she got post procedure. If she uses that then can't rx tramadol.

## 2019-05-14 NOTE — Telephone Encounter (Signed)
Patient is requesting that Alexis [name] send in some Tramadol for pain after procedure for today.   Pls use : [name] - [place], [place] - [address]  [address], [place] [address]  Phone:  [phone] Fax:  [phone]  DEA #:  --

## 2019-05-14 NOTE — Telephone Encounter (Signed)
Appointment set for tomorrow , tramadol is being sent today or tomorrow?

## 2019-05-14 NOTE — Telephone Encounter (Signed)
Pt called requesting Oxycodone.  I spoke with Dr Donzetta Matters who did her procedure earlier today.  No narcotic prescribed.  Chart reviewed and her family practice office apparently called in some Tramadol and has Wednesday appt to discuss pain management.   I discussed all of this with pt.  No narcotic given.  Ruta Hinds, MD [facility] Office: [phone]

## 2019-05-14 NOTE — Chronic Care Management (AMB) (Deleted)
Chronic Care Management Pharmacy  Name: Alexis [name]  MRN: [id number] DOB: Jun 29, 1950  Chief Complaint/ HPI  Alexis [name],  69 y.o. , female presents for their completion of Initial CCM visit with the clinical pharmacist (first visit on 04/26/19) via telephone due to COVID-19 Pandemic.  PCP : Alexis Pai, PA-C  Their chronic conditions include: DM, CHF, PVD, CHF, AFib, HTN, Hypothyroid, Osteoarthritis, Gout, HLD  Office Visits: 04/30/19: Visit w/ Alexis Pai, PA-C - Foot infection. Continue with dicloxacillin, keep vascular appt on 05-10-19. Touch base with neuro on Pristiq recommendation noting pt's depression and need for neuropathy relief.   Consult Visit: 05/01/19: EGD w/ Dr. Lyndel [name]  04/30/19: Patient prescribed Pristiq by Dr. Dorena [name] per PCP request  04/30/19: Endo visit w/ Alexis Jefferson PA-C - A1c 8%. Started patient on Tresiba 10 units daily and Humalog per scale. Scan BG 4 times/Alexis [name]  Humalog Scale 201-250 - 2 units 251-300 - 4 units 301-350 - 6 units 351-400 - 8 units 401-450 - 10 units >451 - 12 units  04/19/19: Cardio virtural visit w/ Dr. Agustin [name] - Permanent Afib. No anticoag due to comorbidity namely cirrhosis of liver and varicosis of esophagus. Rate controlled. No symptoms. PVD followed by Dr. Rachelle [name]. Neuropathic pain followed by neuro. Pacemaker present. Last interrogation was 12/2018.  03/28/19: Podiatry office visit w/ Dr. Aida [name] - Cutaneous ulcer. Plan for wound care at home. Weight dispersion pads applied to plantar medial aspect of R heel. Debrided with rotary bur the left first digit nail plate. Debrided the partial thickness left heel ulceration. Encouraged to seek counsel of peripheral vascular disease specialist.   01/15/19: Office visit w/ Dr. Lyndel [name] - Cirrhosis of liver without ascites. Increase aciphex to 33m BID (pt later did not tolerate), continue lasix 673mqd, increase spironolactone to 5060maily, Labs ordered (autoimmune hepatitis panel),  if hepA and hepB antibodies negative. Vaccination recommended.   01/08/19: [facility] visit w/ Alexis MontanaP - CHF Echo 10/02/18 LVEF 40%. Off of anticoag since 07/2018 due to thrombocytopenia. No med changes noted.    Medications: Facility-Administered Encounter Medications as of 05/14/2019  Medication  . 0.9 %  sodium chloride infusion  . 0.9 %  sodium chloride infusion  . acetaminophen (TYLENOL) tablet 650 mg  . clopidogrel (PLAVIX) tablet 300 mg   Followed by  . [START ON 05/15/2019] clopidogrel (PLAVIX) tablet 75 mg  . hydrALAZINE (APRESOLINE) injection 5 mg  . HYDROmorphone (DILAUDID) injection 0.5-1 mg  . labetalol (NORMODYNE) injection 10 mg  . ondansetron (ZOFRAN) injection 4 mg  . oxyCODONE (Oxy IR/ROXICODONE) immediate release tablet 5-10 mg  . sodium chloride flush (NS) 0.9 % injection 3 mL  . sodium chloride flush (NS) 0.9 % injection 3 mL   Outpatient Encounter Medications as of 05/14/2019  Medication Sig Note  . acetaminophen (TYLENOL) 325 MG tablet Take 650 mg by mouth every 6 (six) hours as needed for moderate pain or headache.   . albuterol (VENTOLIN HFA) 108 (90 Base) MCG/ACT inhaler Inhale 2 puffs into the lungs every 6 (six) hours as needed for wheezing or shortness of breath.   . allopurinol (ZYLOPRIM) 100 MG tablet TAKE ONE TABLET BY MOUTH TWICE DAILY (Patient taking differently: 100 mg 2 (two) times daily. )   . AMBULATORY NON FORMULARY MEDICATION Motorized scooter.  Diagnosis: Osteoarthritis M19.90   . atorvastatin (LIPITOR) 10 MG tablet Take 1 tablet (10 mg total) by mouth daily.   . carbamazepine (TEGRETOL XR) 200 MG 12 hr tablet Take 200-400  mg by mouth See admin instructions. Take 200 mg at night for 7 days then take 400 mg at night 05/11/2019: Started 3/11 should start 400 mg dose around 3/18  . clopidogrel (PLAVIX) 75 MG tablet Take 1 tablet (75 mg total) by mouth daily.   . Continuous Blood Gluc Sensor (FREESTYLE LIBRE 14 Alexis [name] SENSOR) MISC    .  desvenlafaxine (PRISTIQ) 50 MG 24 hr tablet Take 50 mg by mouth at bedtime.    . dicloxacillin (DYNAPEN) 500 MG capsule Take 1 capsule (500 mg total) by mouth 3 (three) times daily. (Patient not taking: Reported on 05/11/2019)   . fluticasone (FLONASE) 50 MCG/ACT nasal spray Place 2 sprays into both nostrils at bedtime.   . furosemide (LASIX) 20 MG tablet Take 2 tables in the morning and 1 tablet at noon (Patient taking differently: Take 20-40 mg by mouth See admin instructions. Take 40 mg in the morning and 20 mg in the afternoon)   . insulin degludec (TRESIBA) 100 UNIT/ML FlexTouch Pen Inject 10 Units into the skin daily.    . insulin lispro (HUMALOG) 100 UNIT/ML injection Inject into the skin 3 (three) times daily before meals. 05/11/2019: Pt hasnt started, wasn't sure what the sliding scale yet, waiting on instructions from dr  . Insulin Pen Needle (PEN NEEDLES 31GX5/16") 31G X 8 MM MISC Use as needed 4 times/Alexis [name].  Dx E11.65   . levocetirizine (XYZAL) 5 MG tablet Take 5 mg by mouth at bedtime.   . Levothyroxine Sodium 150 MCG CAPS Take 1 capsule (150 mcg total) by mouth daily before breakfast.   . Lidocaine HCl 4 % LIQD Apply 10 mLs topically 2 (two) times daily. (Patient taking differently: Apply 10 mLs topically 2 (two) times daily as needed (pain). )   . LORazepam (ATIVAN) 0.5 MG tablet Take 1 tablet (0.5 mg total) by mouth every 8 (eight) hours as needed for anxiety. (Patient taking differently: Take 0.5 mg by mouth at bedtime. )   . losartan (COZAAR) 25 MG tablet TAKE ONE (1) TABLET BY MOUTH EVERY Alexis [name]   . metoprolol succinate (TOPROL-XL) 25 MG 24 hr tablet TAKE ONE (1) TABLET BY MOUTH EACH Alexis [name] (Patient taking differently: Take 25 mg by mouth daily. )   . ondansetron (ZOFRAN-ODT) 4 MG disintegrating tablet Take 4 mg by mouth every 8 (eight) hours as needed for nausea or vomiting.    . Prenatal Vit-Fe Fumarate-FA (MULTIVITAMIN-PRENATAL) 27-0.8 MG TABS tablet Take 1 tablet by mouth daily.    .  Probiotic CAPS Take 2 capsules by mouth daily.   . psyllium (METAMUCIL SMOOTH TEXTURE) 58.6 % powder Take 1 packet by mouth daily. (Patient not taking: Reported on 05/11/2019)   . RABEprazole (ACIPHEX) 20 MG tablet Take 1 tablet (20 mg total) by mouth daily.   Marland Kitchen spironolactone (ALDACTONE) 50 MG tablet Take 1 tablet (50 mg total) by mouth daily.   . [DISCONTINUED] losartan (COZAAR) 25 MG tablet TAKE ONE (1) TABLET BY MOUTH EVERY Seydina Holliman (Patient taking differently: Take 25 mg by mouth daily. )    Immunization History  Administered Date(s) Administered  . Fluad Quad(high Dose 65+) 11/08/2018  . Hepatitis B, adult 01/31/2019  . Hepatitis B, ped/adol 03/19/2019, 03/19/2019  . Influenza Split 01/05/2007, 12/23/2010, 01/03/2012, 05/11/2012  . Influenza, High Dose Seasonal PF 01/09/2015, 12/24/2015, 01/14/2017, 12/12/2017  . Influenza,inj,Quad PF,6+ Mos 12/24/2013, 01/09/2015  . Influenza-Unspecified 01/05/2007, 12/23/2010, 01/03/2012, 05/11/2012, 01/09/2015  . Pneumococcal Conjugate-13 01/09/2015  . Pneumococcal Polysaccharide-23 05/11/2012  . Td 12/23/2010  .  Tdap 12/23/2010     Current Diagnosis/Assessment:  Goals Addressed   None    Patient is very confused about medication regimen.  Significant time spent addressing DM as that is the most pressing issue for the patient noting her diabetic foot infections and a1c >/= 8%.    Diabetes   Recent Relevant Labs: Lab Results  Component Value Date/Time   HGBA1C 9.0 04/03/2015 12:00 AM   MICROALBUR 0.3 04/10/2015 10:40 AM    04/30/19: a1c = 8.0%, glucose = 258 (at Endo visit)  A1c goal <7%   [employer] stopped paying for V-go for about a year ago Scientist, clinical (histocompatibility and immunogenetics)) per patient. Patient thinks that her current insurance may now cover it.  Patient admits that she would need re-education on how to use the V-go. I am agreeable to help patient with this as her BG control is vital to aiding in the healing of her diabetic foot wounds/possible future  toe amputations.   Pt followed by Endo. Autumn Jones PA-C. Next appt 05/04/19 (pt plans to cancel due to having EGD prior)  She hates sticking herself and would prefer to get V-go versus adding basal insulin to regimen.  Pt states when she uses Novolog it will go "too low" Pt would benefit from having basal insulin added to her regimen either through addition of an additional basal insulin such as Lantus or obtaining a continuous insulin delivery device such as the V-go that can provide basal insulin throughout the Sh[place]ach Bartunek.   Got disconnected from patient during visit, called back and left VM for patient to return my call, then called Autumn Jone's office to coordinate care to get patient prescription for V-go. Spoke to [employer] nurse, Caren Griffins. Informed Caren Griffins that patient planned to cancel 3/5 appt and Caren Griffins offered to have patient seen by Autumn on 04/30/19 prior to her EGD scheduled for 05/01/19. Caren Griffins to call patient back to confirm appt time to see Autumn and request that patient call me back to complete our appt.   Called pt back. She is not feeling well and would like to complete visit at future time. Encouraged patient to keep appt on 04/30/19 with Autumn to get her set up with V-go. Caren Griffins also mentioned that the diabetic educator Jan can teach patient how to use V-go. This would be greatly beneficial for patient.  Patient is currently uncontrolled on the following medications: Only bolus insulin of Novolog per sliding scale  Novolog Sliding Scale 70-90 - 10 units 90-130 - 16 units 130-150 - 18 units 151-200 - 20 units 201-250 - 22 units 251 -300 - 24 units  301-350 - 26 units  Last diabetic Eye exam:  Lab Results  Component Value Date/Time   HMDIABEYEEXA No Retinopathy 04/13/2017 12:00 AM    Last diabetic Foot exam: No results found for: HMDIABFOOTEX   We discussed: Importance of having a basal/bolus regimen or getting set up with V-go insulin pump to gain better DM  control  Plan -Follow up with Autumn on 04/30/19 to get set up with V-go  Heart Failure   Did not fully assess with patient. Information was gathered upon chart review  Type: Combined Systolic and Diastolic  Last ejection fraction: 09/2018 40%  Patient has failed these meds in past: ramipril (low bp, renal function fluctuation?) Patient is currently on the following medications: furosemide 81m 2AM and 1 PM, losartan 22mdaily, metoprolol succinate 2561maily, spironolactone 78m63mily   Hypertension   Did not fully assess with patient. Information was gathered  upon chart review  Office blood pressures are  BP Readings from Last 3 Encounters:  05/14/19 (!) 107/56  05/04/19 (!) 107/49  05/03/19 118/64    Patient has failed these meds in the past: ramipril (low bp, renal function fluctuation?)  Patient is currently on the following medications: metoprolol 72m daily    Hyperlipidemia   Did not fully assess with patient. Information was gathered upon chart review  Lipid Panel     Component Value Date/Time   CHOL 93 (L) 05/10/2018 0944   TRIG 147 05/10/2018 0944   HDL 31 (L) 05/10/2018 0944   CHOLHDL 3.0 05/10/2018 0944   CHOLHDL 6 04/10/2015 1040   VLDL 50.8 (H) 04/10/2015 1040   LDLCALC 33 05/10/2018 0944   LDLDIRECT 111.0 04/10/2015 1040   LABVLDL 29 05/10/2018 0944     ASCVD 10-year risk: Unable to calculate due to LDL <70   Patient has failed these meds in past: atorvastatin?  Patient is currently on the following medications: None   AFIB   Did not fully assess with patient. Information was gathered upon chart review  Patient is currently rate controlled. Has pacemaker  Patient has failed these meds in past: None noted Patient is currently on the following medications: metoprolol succinate 233mdaily  Hypothyroidism   Did not fully assess with patient. Information was gathered upon chart review  03/15/17: TSH = 2.09  Patient has failed these meds  in past: None noted Patient is currently on the following medications: levothyroxine 15084mdaily  Plan -Get TSH drawn at next office visit  Osteoarthritis    Did not fully assess with patient. Information was gathered upon chart review  Patient has failed these meds in past: None noted Patient is currently on the following medications: tramadol 38m79mH PRN  Gout   Did not fully assess with patient. Information was gathered upon chart review  Patient has failed these meds in past: None noted Patient is currently on the following medications: allopurinol 100mg55m  Allergic Rhinitis    Did not fully assess with patient. Information was gathered upon chart review  Patient has failed these meds in past: None noted Patient is currently on the following medications: fluticasone 38mcg56mprays EN HS, levocetirizine 5mg da51m, azelastine 0.1% 2 sprays EN BID  Miscellaneous Meds Prenatal vitamin Metamucil Ondansetron Mupirocin  lidocaine dicloxacillin

## 2019-05-14 NOTE — Telephone Encounter (Signed)
Is it okay to send in the tramadol , I see she received narcotics today ?

## 2019-05-14 NOTE — Telephone Encounter (Signed)
Appt set for tomorrow .

## 2019-05-14 NOTE — Telephone Encounter (Signed)
What due you mean by weight parameter?

## 2019-05-14 NOTE — H&P (Signed)
HPI:  Alexis [name] is a pleasant 69 y.o. female who I last saw on 10/04/2018. She has infrainguinal arterial occlusive disease bilaterally. On the right side Dr. Donzetta Matters has previously performed atherectomy of the right superficial femoral artery and popliteal artery with drug-coated balloon angioplasty. She has peroneal runoff only on the right.  Since I saw her last she developed a small wound on her right great toe which progressed into a more significant wound and also she developed a small wound on her right heel. She was referred with critical limb ischemia.  She has also been diagnosed with cirrhosis of the liver and it is gone from 250 pounds to it 180 pounds.  She does have a significant cardiac history and with a history of previous myocardial infarctions and also congestive heart failure.  She denies significant rest pain in the feet. She does have some claudication bilaterally.      Past Medical History:  Diagnosis Date  . Allergy   . Anxiety   . Arthritis   . Asthma   . Atrial fibrillation ([place])   . CHF (congestive heart failure) ([place])   . Cirrhosis ([place])   . COPD (chronic obstructive pulmonary disease) ([place])   . Diabetes mellitus without complication ([place])   . Hyperlipidemia   . Hypertension   . Macrocytic anemia 09/04/2018  . Myocardial infarction ([place])    several  . Pacemaker    CRT-P with RV lead and His Bundle lead ( high threshold)   . Peripheral neuropathy   . Pneumonia   . PONV (postoperative nausea and vomiting)    unsure of exactly what happened at [facility] in 2010 (knee surgery) that led to crital care  . Thyroid disease         Family History  Problem Relation Age of Onset  . Diabetes Mother   . Hyperlipidemia Mother   . Diabetes Father   . Hyperlipidemia Father   . Heart disease Father    before age 77  . Heart attack Father   . Diabetes Brother   . Hyperlipidemia Brother   . Heart attack Brother    SOCIAL HISTORY:  Social  History        Tobacco Use  . Smoking status: Former Smoker    Quit date: 03/01/1998    Years since quitting: 21.1  . Smokeless tobacco: Never Used  Substance Use Topics  . 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        Allergies  Allergen Reactions  . Indomethacin Other (See Comments)    Renal Insufficiency  . Pregabalin Other (See Comments)    DIZZINESS   . Sulfa Antibiotics Hives  . Morphine And Related Nausea And Vomiting         Current Outpatient Medications  Medication Sig Dispense Refill  . acetaminophen (TYLENOL) 325 MG tablet Take 650 mg by mouth every 6 (six) hours as needed for moderate pain or headache.    . albuterol (VENTOLIN HFA) 108 (90 Base) MCG/ACT inhaler Inhale 2 puffs into the lungs every 6 (six) hours as needed for wheezing or shortness of breath.    . allopurinol (ZYLOPRIM) 100 MG tablet TAKE ONE TABLET BY MOUTH TWICE DAILY (Patient taking differently: Take 100 mg by mouth 2 (two) times daily. ) 60 tablet 3  . AMBULATORY NON FORMULARY MEDICATION Motorized scooter. Diagnosis: Osteoarthritis M19.90 1 each  0  . azelastine (ASTELIN) 0.1 % nasal spray Place 2 sprays into both nostrils 2 (two) times daily. Use in each nostril as directed    . Continuous Blood Gluc Sensor (FREESTYLE LIBRE 14 DAY SENSOR) MISC     . dicloxacillin (DYNAPEN) 500 MG capsule Take 1 capsule (500 mg total) by mouth 3 (three) times daily. 30 capsule 0  . fluticasone (FLONASE) 50 MCG/ACT nasal spray Place 2 sprays into both nostrils at bedtime. (Patient taking differently: Place 2 sprays into both nostrils in the morning and at bedtime. )    . furosemide (LASIX) 20 MG tablet Take 2 tables in the morning and 1 tablet at noon (Patient taking differently: Take 20-40 mg by mouth daily. Take 40 mg in the morning and 20 mg in the afternoon) 270 tablet 1  . insulin aspart (NOVOLOG) 100 UNIT/ML injection Inject 10-21 Units  into the skin 3 (three) times daily as needed for high blood sugar.    . levocetirizine (XYZAL) 5 MG tablet Take 5 mg by mouth at bedtime.    . Levothyroxine Sodium 150 MCG CAPS Take 1 capsule (150 mcg total) by mouth daily before breakfast. 30 capsule 3  . Lidocaine HCl 4 % LIQD Apply 10 mLs topically 2 (two) times daily. (Patient taking differently: Apply 10 mLs topically 2 (two) times daily as needed (pain). ) 73 mL 0  . losartan (COZAAR) 25 MG tablet TAKE ONE (1) TABLET BY MOUTH EVERY DAY (Patient taking differently: Take 25 mg by mouth daily. ) 90 tablet 0  . metoprolol succinate (TOPROL-XL) 25 MG 24 hr tablet TAKE ONE (1) TABLET BY MOUTH EACH DAY (Patient taking differently: Take 25 mg by mouth daily. ) 90 tablet 1  . ondansetron (ZOFRAN-ODT) 4 MG disintegrating tablet Take 4 mg by mouth every 8 (eight) hours as needed.    . Prenatal Vit-Fe Fumarate-FA (MULTIVITAMIN-PRENATAL) 27-0.8 MG TABS tablet Take 1 tablet by mouth daily.     . psyllium (METAMUCIL SMOOTH TEXTURE) 58.6 % powder Take 1 packet by mouth daily. (Patient taking differently: Take 1 packet by mouth daily as needed (constipation). ) 283 g 12  . RABEprazole (ACIPHEX) 20 MG tablet Take 1 tablet (20 mg total) by mouth daily. 30 tablet 1  . spironolactone (ALDACTONE) 50 MG tablet Take 1 tablet (50 mg total) by mouth daily. 30 tablet 11   No current facility-administered medications for this visit.   REVIEW OF SYSTEMS:  [X]  denotes positive finding, [ ]  denotes negative finding  Cardiac  Comments:  Chest pain or chest pressure:    Shortness of breath upon exertion: x   Short of breath when lying flat:    Irregular heart rhythm:        Vascular    Pain in calf, thigh, or hip brought on by ambulation:    Pain in feet at night that wakes you up from your sleep:     Blood clot in your veins:    Leg swelling:         Pulmonary    Oxygen at home:    Productive cough:     Wheezing:         Neurologic    Sudden weakness in  arms or legs:     Sudden numbness in arms or legs:     Sudden onset of difficulty speaking or slurred speech:    Temporary loss of vision in one eye:     Problems with dizziness:  Gastrointestinal    Blood in stool:     Vomited blood:         Genitourinary    Burning when urinating:     Blood in urine:        Psychiatric    Major depression:         Hematologic    Bleeding problems:    Problems with blood clotting too easily:        Skin    Rashes or ulcers:        Constitutional    Fever or chills:     PHYSICAL EXAM:   Vitals:   05/14/19 0552  BP: 97/73  Pulse: 73  Resp: 12  Temp: 97.6 F (36.4 C)  SpO2: 98%    GENERAL: The patient is a well-nourished female, in no acute distress. The vital signs are documented above.  CARDIAC: There is a regular rate and rhythm.  VASCULAR: I do not detect carotid bruits.  On the right side, which is the side of concern, I cannot palpate a femoral pulse. She has some superficial ulcerations on her pannus. She has monophasic Doppler signals in the right foot in the dorsalis pedis and posterior tibial positions.  On the left side she has a palpable femoral pulse. She has some superficial ulcerations on her pannus. She has monophasic Doppler signals in the left foot.  PULMONARY: There is good air exchange bilaterally without wheezing or rales.  ABDOMEN: Soft and non-tender with normal pitched bowel sounds.  MUSCULOSKELETAL: There are no major deformities or cyanosis.  NEUROLOGIC: No focal weakness or paresthesias are detected.  SKIN: She has extensive wounds on the right foot involving the right great toe and heel.    PSYCHIATRIC: The patient has a normal affect.  DATA:   ARTERIAL DOPPLER STUDY: I have independently interpreted her arterial Doppler study today.  On the right side there is a monophasic dorsalis pedis and posterior tibial signal. ABI is 41%. Toe pressure 0.  On the left side she has a monophasic dorsalis  pedis and posterior tibial signal. ABIs 55%. Toe pressure 0.  LABS: I reviewed her labs from 04/12/2019. GFR was 52. Creatinine 1.04.  MEDICAL ISSUES:   CRITICAL LIMB ISCHEMIA: plan for arteriogram rle possible intervention.   [facility] Office: [phone] Pager: [phone]

## 2019-05-14 NOTE — Op Note (Signed)
Patient name: Alexis [name] MRN: [id number] DOB: 13-Oct-1950 Sex: female  05/14/2019 Pre-operative Diagnosis: Critical right lower extremity ischemia Post-operative diagnosis:  Same Surgeon:  Erlene Quan C. Donzetta Matters, MD Procedure Performed: 1.  Ultrasound-guided cannulation left common femoral artery 2.  Right lower extremity angiogram 3.  Ultrasound-guided cannulation right posterior tibial artery 4.  Laser atherectomy with 1.5 mm catheter of right SFA and popliteal arteries 5.  Stent of right popliteal artery with 5 x 60 mm [place] 6.  Minx device closure left common femoral artery 7.  Moderate sedation with fentanyl and Versed 79 minutes   Indications: 69 year old female with gangrenous changes to right great toe.  She is undergone angiography no intervention was undertaken.  She is now indicated for repeat angiography possible intervention from a tibial access site.  Findings: SFA had 16 cm occlusive lesion extending down into the at the knee popliteal artery.  After laser atherectomy we had a channel with one area of dissection this was stented.  We ballooned the stent as well as the SFA with 4 mm balloon.  She then had brisk flow to the foot via the peroneal.  The anterior tibial artery was occluded after the takeoff did reconstitute.  We did not demonstrate flow via the posterior tibial artery has had a wire and sheath in place completion but I suspect there is good flow there as well.  Where previously there was a 16 cm lesion there is now 0% residual stenosis.   Procedure:  The patient was identified in the holding area and taken to room 8.  The patient was then placed supine on the table and prepped and draped in the usual sterile fashion.  A time out was called.  Ultrasound was used to evaluate the left common femoral artery.  There was significant hematoma there.  The areas anesthetized 1% lidocaine cannulated with micropuncture needle followed the wire sheath.  And images saved the  permanent record.  We placed a 5 French sheath followed by Bentson wire we crossed the bifurcation with Omni catheter and Glidewire advantage.  We then placed a long 6 French sheath patient was fully heparinized.  We used a bare catheter to get the lower advantage into the SFA.  We then used a quick cross catheter.  We could not cross antegrade.  We used ultrasound to identify the posterior tibial artery.  This was large somewhat calcified.  There is anesthetized 1% lidocaine.  We cannulated posterior tibial artery at the ankle with micropuncture needle followed wire and sheath.  Images saved the permanent record.  We used a CXI catheter and V 18 wire to traverse the lesion retrograde.  We then confirmed intraluminal access.  We exchanged for an 014 wire snare this through and through.  We then performed laser atherectomy of the SFA and popliteal occlusive segment.  This was then ballooned with 3.5 mm balloon.  Completion demonstrated dissection of the just above the knee joint.  This was primarily stented with [place] stent.  The entire segment was postdilated with 4 mm balloon at nominal pressure.  Completion demonstrated brisk flow through the peroneal artery.  The anterior tibial artery occluded but did reconstitute.  Posterior tibial appears to be patent however there is a wire and sheath in place.  There is much better filling of the foot than her previous imaging.  Satisfied we remove the wire sheath.  We exchanged over a stiffer wire for short 6 French sheath a minx device was deployed.  We remove the sheath at the ankle pressure was held till hemostasis obtained.  She tolerated seizure without any complication.  Contrast: 75cc   Alexis [name] C. Donzetta Matters, MD [facility] Office: [phone] Pager: [phone]

## 2019-05-14 NOTE — Telephone Encounter (Addendum)
Dillaudid- IV Tyneol 325 Oxycodone 5-10  And it just says hospital medication detail in the chart     Pt can start tramadol tomorrow morning. I can send in rx. But have her schedule appointment tomorrow or wed for virtual visit. Want to discuss plan for her pain as well as anxiety.

## 2019-05-14 NOTE — Telephone Encounter (Signed)
Rx tramadol sent to pt pharmacy.

## 2019-05-14 NOTE — Telephone Encounter (Signed)
Just spoke with [place] and she didn't receive oxycodone and she said she wasn't given any medication , so she will take the tramadol .

## 2019-05-14 NOTE — Telephone Encounter (Signed)
What medication did she get? Did she fill at pharmacy or did she have after procedure?

## 2019-05-15 ENCOUNTER — Other Ambulatory Visit: Payer: Self-pay

## 2019-05-15 ENCOUNTER — Telehealth: Payer: Self-pay | Admitting: Medical

## 2019-05-15 ENCOUNTER — Ambulatory Visit (INDEPENDENT_AMBULATORY_CARE_PROVIDER_SITE_OTHER): Payer: HMO | Admitting: Medical

## 2019-05-15 ENCOUNTER — Telehealth: Payer: Self-pay | Admitting: *Deleted

## 2019-05-15 DIAGNOSIS — G629 Polyneuropathy, unspecified: Secondary | ICD-10-CM | POA: Diagnosis not present

## 2019-05-15 DIAGNOSIS — F419 Anxiety disorder, unspecified: Secondary | ICD-10-CM | POA: Diagnosis not present

## 2019-05-15 DIAGNOSIS — I739 Peripheral vascular disease, unspecified: Secondary | ICD-10-CM

## 2019-05-15 DIAGNOSIS — I10 Essential (primary) hypertension: Secondary | ICD-10-CM

## 2019-05-15 DIAGNOSIS — E039 Hypothyroidism, unspecified: Secondary | ICD-10-CM

## 2019-05-15 DIAGNOSIS — E118 Type 2 diabetes mellitus with unspecified complications: Secondary | ICD-10-CM

## 2019-05-15 DIAGNOSIS — I509 Heart failure, unspecified: Secondary | ICD-10-CM

## 2019-05-15 MED ORDER — BUSPIRONE HCL 7.5 MG PO TABS: 7.5000 mg | ORAL_TABLET | Freq: Two times a day (BID) | ORAL | 0 refills | Status: DC

## 2019-05-15 NOTE — Telephone Encounter (Signed)
I put in chest xray, bnp and cmp today. She needs these done tomorrow or on 05/17/2019.

## 2019-05-15 NOTE — Patient Outreach (Signed)
  [facility]) Care Management Chronic Special Needs Program    05/14/19  Name: RONYA GILCREST, DOB: 08/05/1950  MRN: [id number]   Ms. Francine Hannan is enrolled in a chronic special needs plan for Diabetes. Telephone call to client for assessment follow up. Unable to reach. HIPAA compliant voice message left with call back phone number.   PLAN: RNCM will attempt 2nd telephone outreach to client within 1 week.   Quinn Plowman RN,BSN,CCM [facility] [phone]

## 2019-05-15 NOTE — Telephone Encounter (Signed)
Skilled nurse requesting a VO for new weight parameters current weight parameters are 195-202 ponds.  That was done by the admitting nurse.  Her weight now is fluctuating btwn 187-190.  Please call Danae Chen with [facility] [phone].

## 2019-05-15 NOTE — Progress Notes (Signed)
   Subjective:    Patient ID: Alexis [name], female    DOB: [birth year], 69 y.o.   MRN: [id number]  HPI  Virtual Visit via Telephone Note  I connected with Alexis [name] on 05/15/19 at  9:40 AM EDT by telephone and verified that I am speaking with the correct person using two identifiers.  Location: Patient: home Provider: office  Pt did not check vitals today.   I discussed the limitations, risks, security and privacy concerns of performing an evaluation and management service by telephone and the availability of in person appointments. I also discussed with the patient that there may be a patient responsible charge related to this service. The patient expressed understanding and agreed to proceed.   History of Present Illness:   Pt in post surgery to revascularization surgery yesterday.  hpi from yesterday. HPI:  Alexis [name] is a pleasant 69 y.o. female who I last saw on 10/04/2018. She has infrainguinal arterial occlusive disease bilaterally. On the right side Dr. Donzetta Matters has previously performed atherectomy of the right superficial femoral artery and popliteal artery with drug-coated balloon angioplasty. She has peroneal runoff only on the right.  Since I saw her last she developed a small wound on her right great toe which progressed into a more significant wound and also she developed a small wound on her right heel. She was referred with critical limb ischemia.  She has also been diagnosed with cirrhosis of the liver and it is gone from 250 pounds to it 180 pounds.  She does have a significant cardiac history and with a history of previous myocardial infarctions and also congestive heart failure.    She denies significant rest pain in the feet. She does have some claudication bilaterally.   Per pt they did establish vascular flow. Previous dry toe wound now bleeding a little.   Pt chronic neuropathy type pain as she is diabetic. Pt states neurologist gave her  carbamazepine. Also takes tramadol only one tab at night.    Observations/Objective: General- no acute distress, pleasant, alert and oriented. Feeling less stress and no anxious. She is relieved.   Assessment and Plan: You had procedure for revascularization yesterday. Hopefully lower ext will begin to heal now.  For neuropathy continue with tramadol.   For anxiety  advise using buspar if needed and advise won't use ativan presently. Due to concern with potential interaction with tramadol.(Note presently anxiety resolving as optimistic toe might be saved)  Follow up  2 weeks or as needed.  Follow Up Instructions:    I discussed the assessment and treatment plan with the patient. The patient was provided an opportunity to ask questions and all were answered. The patient agreed with the plan and demonstrated an understanding of the instructions.   The patient was advised to call back or seek an in-person evaluation if the symptoms worsen or if the condition fails to improve as anticipated.  I provided 25  minutes of non-face-to-face time during this encounter.   Mackie Pai, PA-C    Review of Systems     Objective:   Physical Exam        Assessment & Plan:

## 2019-05-15 NOTE — Patient Instructions (Signed)
You had procedure for revascularization yesterday. Hopefully lower ext will begin to heal now.  For neuropathy continue with tramadol.   For anxiety  advise using buspar if needed and advise won't use ativan presently. Due to concern with potential interaction with tramadol.(Note presently anxiety resolving as optimistic toe might be saved)  Follow up  2 weeks or as needed.

## 2019-05-15 NOTE — Telephone Encounter (Signed)
Call Type Triage / Clinical Relationship To Patient Self Return Phone Number [phone] (Primary) Chief Complaint Foot or Ankle Injury Reason for Call Medication Question / Request Initial Comment Caller states her doctor was supposed to call in her tramadol and she is in a lot of pain in her ankle. Translation No Nurse Assessment Nurse: Harvie Bridge, RN, Beth Date/Time (Eastern Time): 05/14/2019 5:47:04 PM Confirm and document reason for call. If symptomatic, describe symptoms. ---Caller states her doctor was supposed to call in her tramadol ( controlled substance ) and she is in a lot of pain in her ankle. Surgery today on right ankle. Dr Scot Dock did surgery, who is not with this office. She called that office was told Dr. Florentina Jenny was suppose to call it in. No new or worsening symptoms. Just wants her pain medicine called in. Pain medicine is not at pharmacy: [name], [address] , [place]  [phone] Aller: all pain meds but oxycodone and tramadol Client Medication directive: # Do not call for Meds after-hours

## 2019-05-15 NOTE — Telephone Encounter (Signed)
I put in order for cxr, cmp and bnp. Talked with pt yesterday virtual visit and no report of shortness of breath.   I saw home health request for me to call after hours. Would you call them back and let them know will just have pt do studies I ordered on 3-17 or 3-18. Per conversation with her clinically stable.  Will you contact pt to get scheduled for lab and notify of chest xray.

## 2019-05-15 NOTE — Telephone Encounter (Signed)
Patient has appointment this morning and issue will be discussed.

## 2019-05-16 ENCOUNTER — Other Ambulatory Visit: Payer: Self-pay

## 2019-05-16 ENCOUNTER — Telehealth: Payer: Self-pay

## 2019-05-16 ENCOUNTER — Encounter: Payer: Self-pay | Admitting: Medical

## 2019-05-16 DIAGNOSIS — M79671 Pain in right foot: Secondary | ICD-10-CM | POA: Diagnosis not present

## 2019-05-16 DIAGNOSIS — L97909 Non-pressure chronic ulcer of unspecified part of unspecified lower leg with unspecified severity: Secondary | ICD-10-CM | POA: Diagnosis not present

## 2019-05-16 DIAGNOSIS — E11621 Type 2 diabetes mellitus with foot ulcer: Secondary | ICD-10-CM | POA: Diagnosis not present

## 2019-05-16 NOTE — Telephone Encounter (Signed)
Brad from [facility] called in to report the patients weight update current weight is 191 if there is ant questions please give Leroy Sea a call at [phone] thanks

## 2019-05-16 NOTE — Patient Outreach (Addendum)
[facility]) Care Management Chronic Special Needs Program  05/16/2019  Name: Alexis [name] DOB: January 27, 1951  MRN: [id number]  Ms. Moyinoluwa Dawe is enrolled in a chronic special needs plan for Diabetes. Reviewed and updated care plan.  Subjective: Client states, "I have been throwing up all night."  She reports she has been throwing up due to taking pills prescribed by her neurologist for nerve pain.  Client states she has taken a nausea medication that helped to stop the vomiting. RNCM advised client to contact her neurologist to inform them of her symptoms. Client verbalized understanding.  Client states she has a follow up appointment scheduled with her podiatrist for 10/21/19 and a follow up appointment with her eye doctor in April 2021. Client states she is able to afford her medications at this time and she is taking them as prescribed. She states she was able to get her Advanced directive completed but has not completed the DNR order because she unsure if she wanted a DNR at this time. Client states she uses the Free style libre 14 day  for blood sugar monitoring. She reports her fasting blood sugar this morning was 168. She reports her most recent A1c was 8.0. Client states went to a diabetic educator for a refresher on last week.  Client states she feels better about her health condition because her foot/ toe is responding to a recent procedure. Client reports she has a wound to her right great toes and right heel. She reports her right great toe is "black."  Client reports having recent surgery to her right leg/foot area to improve blood flow. Client states the area is bleeding some which is good. Denies having any dressing changes to her foot.  Client states she is receiving dressing changes to her abdomen by the home health nurse 2 times per week. Client states she had sores on her abdomen area. She reports the areas on abdomen are looking better since receiving the  dressing changes. Client reports she continues to receive home health physical therapy.   Goals Addressed            This Visit's Progress   . COMPLETED:  Acknowledge receipt of Advanced Directive package       Per client she has completed her living will and health care power of attorney.      . A1c goal <7%   On track    Per client recent Hgb A1c is 8.0 Goal renewed Discussed diabetes self management actions:  Glucose monitoring per provider recommendation  Visit provider every 3-6 months as directed  Hbg A1C level every 3-6 months.  Carbohydrate controlled meal planning  Taking diabetes medication as prescribed by provider  Physical activity     . COMPLETED: Advanced Care Planning complete by 02/29/20       Per client she has completed her health care power of attorney and living will .     Marland Kitchen COMPLETED: Client understands the importance of follow-up with providers by attending scheduled visits       Primary care provider visits 10/04/18 and 05/15/19 Client reports endocrinology visit follow up was 3 weeks ago Cardiology visit 04/19/19 Podiatry visit 05/08/19 Gastroenterology visit 03/19/19 Vascular doctor visit 05/03/19    . Client verbalize knowledge of Heart Failure disease self management skills by 02/16/19   On track    Client verbalizes understanding of heart failure self management. Client denies ED or hospitalization related to heart failure. Client reports taking medication as  prescribed Attends doctor appointments as scheduled.  Client reports weighing daily and recording.  Goal Renewed 2021.  Attend doctor appointments Signs and symptoms of heart failure reviewed.  Advised to notify doctor for mild to moderate symptoms.  Call 911 for severe symptoms Weight daily and record weights  Continue with low salt diet.   Discussed heart failure action plan.  Review Action plan in HTA calendar      . Client verbalizes knowledge of Heart Attack self management skills  by 04/30/19   On track    Client verbalizes understanding of heart attack self management skills Heart attack signs/ symptoms discussed/ reviewed. Goal renewed 2021 Take your medications as prescribed.  RN case manager discussed heart attack signs/ symptoms Advised to call 911 for symptoms Keep scheduled appointments with provider Send education article: Lowering your risk of heart disease      . COMPLETED: Client will have food insecurities addressed by 04/30/19       Social worker contacted client on 05/04/19 to assist with food resources.  Client reports she is not having issues with obtaining food at this time.      . Client will increase activity tolerance within 3 months   On track    Client reports she continues to use cane, wheelchair, and scooter to assist with ambulation Client reports having home health physical therapy with [facility].  Goal renewed 2021 Continue to utilize medical equipment as advised for ambulation Continue to do home exercises provided by home health physical therapist.     . COMPLETED: Client will report ability to obtain Medications within 3 months       [facility] care management pharmacist  contacted patient on 02/19/19 to assist with medication needs.  Client states she has not further medications needs at this time.     . COMPLETED: Client will report improved coping by 04/30/19       Client states she is coping with and managing her medical conditions at this time.     . Client will report no worsening of symptoms of Atrial Fibrillation within the next 3 months   On track    Client states she uses a smart watch to monitor her blood pressure and heart rate and states her cardiologist is aware.  Client denies symptoms related to Atrial fibrillation Goal renewed 2021 Take your medication as prescribed Keep follow up appointments with your doctor.  Signs and symptoms of atrial fibrillation discussed.  Report symptoms to your doctor.  RN case  manager will send education article: Atrial fibrillation and the risk of stroke      . Client will report no worsening of symptoms related to heart disease within the next 3 months   On track    Client denies symptoms related to heart disease Client reports keeping her follow up appointments with her cardiologist.  Cardiology visit 04/19/19 Goal renewed 2021 Continue to take your medication as prescribed.  Continue to follow up with your doctor. Call your doctor if you have questions / concerns Heart attack signs/ symptoms discussed. Call 911 for heart attack symptoms.  RN case manager will send client education article: Lowering your risk of heart disease.     . COMPLETED: Client will use Assistive Devices as needed and verbalize understanding of device use       Client reports utilizing her cane, wheelchair, and scooter for ambulation.      . Client will verbalize knowledge of chronic lung disease as evidenced by no  ED visits or Inpatient stays related to chronic lung disease        [facility] care management pharmacist contacted client on 02/19/19 to assist client with medication needs.  Client denies having any emergency department or hospitalizations related to chronic lung conditions.  Goal renewed 2021 Take your medications as prescribed.  Client advised to keep scheduled appointments with provider Call your doctor if you have questions.       . Client will verbalize knowledge of self management of Hypertension as evidences by BP reading of 140/90 or less; or as defined by provider       Client reports using smart watch to monitor blood pressure and reports results to provider. Client states she takes her medications as prescribed.  Client states she limits her salt intake.  Client reports using her smart watch to monitor her blood pressure.  Goal renewed 2021 Take medications as prescribed by provider Ask Your doctor, "what is my target blood pressure range."  Follow up with  your doctor as recommended.  Continue to monitor your blood pressure as advised and take result to your doctor appointments.  Review High blood pressure information in  HTA calendar.     . Client will verbalize understanding of treatment plan for impaired skin integrity and follow up with provider by 04/30/18   On track    Client reports having abdominal dressing changes 2 times per week with [facility]. Client reports having surgical procedure to her right lower extremity to improve blood flow to toe/foot. Goal renewed 2021 Sign/symptoms of infection reviewed with client Contact your doctor for signs of infections.      . COMPLETED: Client/Caregiver will verbalize understanding of instructions related to self-care and safety       Client denies falls.  Client states she consistently uses, cane, wheelchair, scooter for ambulation Goals renewed 2021:     . COMPLETED: Get set up with V-go or have basal insulin started       Client states she is currently taking tresiba and humulog insulin as prescribed by her doctor.    Marland Kitchen HEMOGLOBIN A1C < 7.0       Per client recent A1c is 8.0 Goal renewed 2021: Discussed diabetes self management actions:  Glucose monitoring per provider recommendation  Visit provider every 3-6 months as directed  Hbg A1C level every 3-6 months.  Carbohydrate controlled meal planning  Taking diabetes medication as prescribed by provider  Physical activity     . COMPLETED: Keep appointment with [facility] on 04/30/19       Client reports having follow up appointment with [facility] 04/30/19.  Client reports follow up visit with Aut[facility] is June 2021    . Maintain timely refills of diabetic medication as prescribed within the year .       Client reports taking her diabetic medication as prescribed.  Review of clients medical record indicates client maintains timely refills of diabetic medications Goal renewed 2021 Continue to take your medications as  prescribed Follow up with your doctor if you have questions Contact your assigned RN care manager if you have difficulty obtaining your medication RN case manager has referred you to  [place] for medication review.     . Obtain annual  Lipid Profile, LDL-C       Lipid completed 05/10/18 Goal renewed 2021 The goal for LDL is less than 70 mg/ dl as you are at high risk for complications try to avoid saturated fats, trans-fats, and eat more  fiber. RN case manager will send client education article: Heart healthy diet.     . Obtain Annual Eye (retinal)  Exam        Annual eye exam 04/13/17 Goal renewed 2021 Client reports next scheduled eye exam is April 2021 Discussed with client importance of yearly eye exam.     . Obtain Annual Foot Exam       Annual foot exam completed 04/24/19 Goal renewed 2021 Diabetes foot care - Check feet daily at home (look for skin color changes, cuts, sores or cracks in the skin, swelling of feet or ankles, ingrown or fungal toenails, corn or calluses). Report these findings to your doctor - Wash feet with soap and water, dry feet well especially between toes - Moisturize your feet but not between the toes - Always wear shoes that protect your whole feet.      . Obtain annual screen for micro albuminuria (urine) , nephropathy (kidney problems)   On track    No recent microalbumin noted in chart Goal renewed 2021 Continue to obtain yearly physicals and follow up visit with         . COMPLETED: Obtain Hemoglobin A1C at least 2 times per year       Hgb A1C completed on 01/01/19 with result of 8.2% and on 03/31/18 with result of 7.8% Goal renewed 2021 Discussed with client importance of consistent follow up with provider for exams and lab work.       . COMPLETED: Visit Primary Care Provider or Endocrinologist at least 2 times per year        Last pcp visit: 10/04/18 and  05/15/19,  Client reports follow up with her primary MD was February 2021 Goal  renewed 2021 Continue to see your primary care provider / endocrinologist as recommended.       ASSESSMENT;  [facility] continues to follow client (most recent home visit 04/29/19, next scheduled visit 05/24/19).  Per chart review client with history of peripheral arterial disease, gangrene and ulcerations to right foot. Under the care of vascular surgery. Client with wound on her right heal and gangrene to big toe. Status post atherectomy of the right superficial femoral artery and popliteal artery with drug-coated balloon angioplasty. Status post procedure for revascularization on 05/14/19  Plan:  Send successful outreach letter with a copy of their individualized care plan, Send individual care plan to provider and Send educational material  Chronic care management coordinator will outreach in:  3 Months  Will refer to:  Pharmacy for polypharmacy     Quinn Plowman RN,BSN,CCM [place] [facility] [phone]    .

## 2019-05-16 NOTE — Telephone Encounter (Signed)
Appt scheduled

## 2019-05-16 NOTE — Telephone Encounter (Signed)
Spoke with Danae Chen from [facility] this morning

## 2019-05-17 ENCOUNTER — Encounter: Payer: Self-pay | Admitting: Medical

## 2019-05-17 ENCOUNTER — Telehealth: Payer: Self-pay | Admitting: Medical

## 2019-05-17 ENCOUNTER — Telehealth: Payer: Self-pay | Admitting: Gastroenterology

## 2019-05-17 NOTE — Telephone Encounter (Signed)
2g salt restricted diet Increase lasix 32m po bid x 1 week, then back to prev dose (see med list) Continue to monitor weight  RG

## 2019-05-17 NOTE — Telephone Encounter (Signed)
Please review and advise.

## 2019-05-17 NOTE — Telephone Encounter (Signed)
I put in chest xray, bnp and cmp today. She needs these done tomorrow or on 05/17/2019.   I tried to send this to your the other day. Did you contact pt. Please get her scheduled to labs and cxr. See if lab can get her in now on 05-18-2019.

## 2019-05-17 NOTE — Telephone Encounter (Signed)
Pt called to report that she has gained 6 lbs in three days.  She was instructed to let Dr. Lyndel Safe know.

## 2019-05-18 DIAGNOSIS — M869 Osteomyelitis, unspecified: Secondary | ICD-10-CM | POA: Diagnosis not present

## 2019-05-18 DIAGNOSIS — J449 Chronic obstructive pulmonary disease, unspecified: Secondary | ICD-10-CM | POA: Diagnosis not present

## 2019-05-18 DIAGNOSIS — I739 Peripheral vascular disease, unspecified: Secondary | ICD-10-CM | POA: Diagnosis not present

## 2019-05-18 DIAGNOSIS — R269 Unspecified abnormalities of gait and mobility: Secondary | ICD-10-CM | POA: Diagnosis not present

## 2019-05-18 NOTE — Telephone Encounter (Signed)
Please get her to do the cxr today. Needs done today due to weight gain and potential for chf.

## 2019-05-18 NOTE — Telephone Encounter (Signed)
She stated she is doing fine and can do the chest xray on Tuesday

## 2019-05-18 NOTE — Telephone Encounter (Signed)
Patient states she is unable to come in today due to her surgery and weather.

## 2019-05-18 NOTE — Telephone Encounter (Signed)
She has a lab appt on 3/23 , the lab was filled for today and Monday

## 2019-05-18 NOTE — Telephone Encounter (Signed)
Noted. I would prefer today but if can't then can wait as long as not short of breath and no pedal edema.

## 2019-05-18 NOTE — Telephone Encounter (Signed)
Called and spoke with patient-patient informed of MD recommendations and reports she "will call back in a week if she is not feeling better";  Patient advised to call back to the office at [phone] should questions/concerns arise;  Patient verbalized understanding of information/instructions;

## 2019-05-19 ENCOUNTER — Encounter: Payer: Self-pay | Admitting: Podiatry

## 2019-05-19 ENCOUNTER — Encounter: Payer: Self-pay | Admitting: Medical

## 2019-05-20 ENCOUNTER — Encounter: Payer: Self-pay | Admitting: Medical

## 2019-05-21 ENCOUNTER — Other Ambulatory Visit: Payer: Self-pay

## 2019-05-21 ENCOUNTER — Encounter: Payer: Self-pay | Admitting: Podiatry

## 2019-05-21 ENCOUNTER — Telehealth: Payer: Self-pay

## 2019-05-21 ENCOUNTER — Other Ambulatory Visit: Payer: HMO

## 2019-05-21 ENCOUNTER — Ambulatory Visit: Payer: HMO | Admitting: Podiatry

## 2019-05-21 VITALS — Temp 97.0°F

## 2019-05-21 DIAGNOSIS — I739 Peripheral vascular disease, unspecified: Secondary | ICD-10-CM | POA: Diagnosis not present

## 2019-05-21 DIAGNOSIS — E11621 Type 2 diabetes mellitus with foot ulcer: Secondary | ICD-10-CM | POA: Diagnosis not present

## 2019-05-21 DIAGNOSIS — L97411 Non-pressure chronic ulcer of right heel and midfoot limited to breakdown of skin: Secondary | ICD-10-CM

## 2019-05-21 DIAGNOSIS — L97909 Non-pressure chronic ulcer of unspecified part of unspecified lower leg with unspecified severity: Secondary | ICD-10-CM | POA: Diagnosis not present

## 2019-05-21 DIAGNOSIS — I96 Gangrene, not elsewhere classified: Secondary | ICD-10-CM

## 2019-05-21 DIAGNOSIS — M79671 Pain in right foot: Secondary | ICD-10-CM | POA: Diagnosis not present

## 2019-05-21 NOTE — Telephone Encounter (Signed)
Patient is unable to make Lab appt Tuesday, but has been r/s for weds 2/24 & stating she is no longer taking tramadol nor busprione due to the many side effects

## 2019-05-21 NOTE — Telephone Encounter (Signed)
I have called and spoke to patient regarding scheduling EGD at [facility]. Patient states that she does not want to proceed at this time. I have informed patient to call office when she is ready to schedule.

## 2019-05-22 ENCOUNTER — Other Ambulatory Visit: Payer: HMO

## 2019-05-22 ENCOUNTER — Telehealth: Payer: Self-pay

## 2019-05-22 ENCOUNTER — Telehealth: Payer: Self-pay | Admitting: Podiatry

## 2019-05-22 ENCOUNTER — Encounter: Payer: Self-pay | Admitting: Podiatry

## 2019-05-22 ENCOUNTER — Ambulatory Visit: Payer: HMO | Admitting: Cardiology

## 2019-05-22 MED ORDER — DOXYCYCLINE HYCLATE 100 MG PO TABS: 100.0000 mg | ORAL_TABLET | Freq: Two times a day (BID) | ORAL | 0 refills | Status: DC

## 2019-05-22 NOTE — Telephone Encounter (Signed)
Patient call and stated that Dr. Jacqualyn Posey was suppose to send anti-biotics for her infection so that it will not get worse. Please call patient

## 2019-05-22 NOTE — Telephone Encounter (Signed)
Called and left pt voicemail stating that Dr. Jacqualyn Posey had send her antibiotic prescription to her pharmacy.

## 2019-05-22 NOTE — Telephone Encounter (Signed)
Pt called because her antibiotics wasn't called into the pharmacy. Pt stated that she was suppose to start this antibiotic immediately.

## 2019-05-22 NOTE — Telephone Encounter (Signed)
I resent antibiotics to the pharmacy for her.   Also, can you try to see about home health? For right now I am applying a small amount of betadine to the wound daily followed by a DSD.

## 2019-05-23 ENCOUNTER — Encounter (HOSPITAL_BASED_OUTPATIENT_CLINIC_OR_DEPARTMENT_OTHER): Payer: Self-pay

## 2019-05-23 ENCOUNTER — Other Ambulatory Visit (INDEPENDENT_AMBULATORY_CARE_PROVIDER_SITE_OTHER): Payer: HMO

## 2019-05-23 ENCOUNTER — Other Ambulatory Visit: Payer: Self-pay

## 2019-05-23 ENCOUNTER — Ambulatory Visit (HOSPITAL_BASED_OUTPATIENT_CLINIC_OR_DEPARTMENT_OTHER)
Admission: RE | Admit: 2019-05-23 | Discharge: 2019-05-23 | Disposition: A | Discharge status: Home / Self Care | Payer: HMO | Source: Ambulatory Visit | Attending: Medical | Admitting: Medical

## 2019-05-23 DIAGNOSIS — I509 Heart failure, unspecified: Secondary | ICD-10-CM

## 2019-05-23 LAB — COMPREHENSIVE METABOLIC PANEL
ALT: 69 U/L — ABNORMAL HIGH (ref 0–35)
AST: 78 U/L — ABNORMAL HIGH (ref 0–37)
Albumin: 4 g/dL (ref 3.5–5.2)
Alkaline Phosphatase: 256 U/L — ABNORMAL HIGH (ref 39–117)
BUN: 55 mg/dL — ABNORMAL HIGH (ref 6–23)
CO2: 30 mEq/L (ref 19–32)
Calcium: 9.9 mg/dL (ref 8.4–10.5)
Chloride: 91 mEq/L — ABNORMAL LOW (ref 96–112)
Creatinine, Ser: 0.96 mg/dL (ref 0.40–1.20)
GFR: 57.41 mL/min — ABNORMAL LOW (ref >60.00)
Glucose, Bld: 175 mg/dL — ABNORMAL HIGH (ref 70–99)
Potassium: 4 mEq/L (ref 3.5–5.1)
Sodium: 128 mEq/L — ABNORMAL LOW (ref 135–145)
Total Bilirubin: 0.9 mg/dL (ref 0.2–1.2)
Total Protein: 7.4 g/dL (ref 6.0–8.3)

## 2019-05-23 LAB — BRAIN NATRIURETIC PEPTIDE: Pro B Natriuretic peptide (BNP): 73 pg/mL (ref 0.0–100.0)

## 2019-05-23 NOTE — Telephone Encounter (Signed)
I spoke with Dr. Leigh Aurora assistant, L. Cox, CMA, she states pt has wounds to other body areas and may have nursing care established. Left message informing pt Dr. Jacqualyn Posey sent her antibiotic to the [facility] in [facility] 05/22/2019 at 4:16pm, and that if she is established with a [facility] agency if she will call me with the agency I will send our wound care orders.

## 2019-05-23 NOTE — Telephone Encounter (Signed)
I reviewed pt's clinicals and she is an established pt with [facility] as of

## 2019-05-23 NOTE — Telephone Encounter (Signed)
Left message for betadine to be applied to the foot and heel wounds and dressed with DSD daily or 3x week to [facility].

## 2019-05-24 ENCOUNTER — Encounter: Payer: Self-pay | Admitting: Podiatry

## 2019-05-24 NOTE — Telephone Encounter (Signed)
I spoke with Danae Chen - [facility] and informed that the toe and heels should be painted with betadine and covered with a DSD, 2 x week by [employer] and once by pt's husband.

## 2019-05-25 ENCOUNTER — Other Ambulatory Visit: Payer: Self-pay

## 2019-05-25 ENCOUNTER — Inpatient Hospital Stay (HOSPITAL_BASED_OUTPATIENT_CLINIC_OR_DEPARTMENT_OTHER)
Admission: EM | Admit: 2019-05-25 | Discharge: 2019-05-29 | DRG: 638 | Disposition: A | Discharge status: Home Health | Payer: HMO | Attending: Internal Medicine | Admitting: Internal Medicine

## 2019-05-25 ENCOUNTER — Emergency Department (HOSPITAL_BASED_OUTPATIENT_CLINIC_OR_DEPARTMENT_OTHER): Payer: HMO

## 2019-05-25 ENCOUNTER — Encounter (HOSPITAL_BASED_OUTPATIENT_CLINIC_OR_DEPARTMENT_OTHER): Payer: Self-pay

## 2019-05-25 DIAGNOSIS — I5022 Chronic systolic (congestive) heart failure: Secondary | ICD-10-CM | POA: Diagnosis not present

## 2019-05-25 DIAGNOSIS — I739 Peripheral vascular disease, unspecified: Secondary | ICD-10-CM | POA: Diagnosis present

## 2019-05-25 DIAGNOSIS — J449 Chronic obstructive pulmonary disease, unspecified: Secondary | ICD-10-CM | POA: Diagnosis present

## 2019-05-25 DIAGNOSIS — I110 Hypertensive heart disease with heart failure: Secondary | ICD-10-CM | POA: Diagnosis present

## 2019-05-25 DIAGNOSIS — L97509 Non-pressure chronic ulcer of other part of unspecified foot with unspecified severity: Secondary | ICD-10-CM

## 2019-05-25 DIAGNOSIS — I96 Gangrene, not elsewhere classified: Secondary | ICD-10-CM | POA: Diagnosis not present

## 2019-05-25 DIAGNOSIS — F329 Major depressive disorder, single episode, unspecified: Secondary | ICD-10-CM | POA: Diagnosis not present

## 2019-05-25 DIAGNOSIS — Z79899 Other long term (current) drug therapy: Secondary | ICD-10-CM

## 2019-05-25 DIAGNOSIS — E1165 Type 2 diabetes mellitus with hyperglycemia: Secondary | ICD-10-CM | POA: Diagnosis not present

## 2019-05-25 DIAGNOSIS — E11621 Type 2 diabetes mellitus with foot ulcer: Secondary | ICD-10-CM | POA: Diagnosis not present

## 2019-05-25 DIAGNOSIS — K766 Portal hypertension: Secondary | ICD-10-CM | POA: Diagnosis not present

## 2019-05-25 DIAGNOSIS — E1152 Type 2 diabetes mellitus with diabetic peripheral angiopathy with gangrene: Secondary | ICD-10-CM | POA: Diagnosis not present

## 2019-05-25 DIAGNOSIS — E11628 Type 2 diabetes mellitus with other skin complications: Secondary | ICD-10-CM | POA: Diagnosis not present

## 2019-05-25 DIAGNOSIS — K7469 Other cirrhosis of liver: Secondary | ICD-10-CM | POA: Diagnosis present

## 2019-05-25 DIAGNOSIS — F32A Depression, unspecified: Secondary | ICD-10-CM | POA: Diagnosis present

## 2019-05-25 DIAGNOSIS — Z882 Allergy status to sulfonamides status: Secondary | ICD-10-CM

## 2019-05-25 DIAGNOSIS — M1990 Unspecified osteoarthritis, unspecified site: Secondary | ICD-10-CM | POA: Diagnosis not present

## 2019-05-25 DIAGNOSIS — L03119 Cellulitis of unspecified part of limb: Secondary | ICD-10-CM | POA: Diagnosis present

## 2019-05-25 DIAGNOSIS — L0390 Cellulitis, unspecified: Secondary | ICD-10-CM | POA: Diagnosis present

## 2019-05-25 DIAGNOSIS — Z950 Presence of cardiac pacemaker: Secondary | ICD-10-CM

## 2019-05-25 DIAGNOSIS — I70269 Atherosclerosis of native arteries of extremities with gangrene, unspecified extremity: Secondary | ICD-10-CM | POA: Diagnosis not present

## 2019-05-25 DIAGNOSIS — D696 Thrombocytopenia, unspecified: Secondary | ICD-10-CM | POA: Diagnosis not present

## 2019-05-25 DIAGNOSIS — L03115 Cellulitis of right lower limb: Secondary | ICD-10-CM | POA: Diagnosis not present

## 2019-05-25 DIAGNOSIS — E669 Obesity, unspecified: Secondary | ICD-10-CM | POA: Diagnosis present

## 2019-05-25 DIAGNOSIS — L97329 Non-pressure chronic ulcer of left ankle with unspecified severity: Secondary | ICD-10-CM | POA: Diagnosis not present

## 2019-05-25 DIAGNOSIS — E1142 Type 2 diabetes mellitus with diabetic polyneuropathy: Secondary | ICD-10-CM | POA: Diagnosis not present

## 2019-05-25 DIAGNOSIS — Z83438 Family history of other disorder of lipoprotein metabolism and other lipidemia: Secondary | ICD-10-CM

## 2019-05-25 DIAGNOSIS — M109 Gout, unspecified: Secondary | ICD-10-CM | POA: Diagnosis not present

## 2019-05-25 DIAGNOSIS — L97519 Non-pressure chronic ulcer of other part of right foot with unspecified severity: Secondary | ICD-10-CM | POA: Diagnosis present

## 2019-05-25 DIAGNOSIS — Z833 Family history of diabetes mellitus: Secondary | ICD-10-CM

## 2019-05-25 DIAGNOSIS — I2510 Atherosclerotic heart disease of native coronary artery without angina pectoris: Secondary | ICD-10-CM | POA: Diagnosis not present

## 2019-05-25 DIAGNOSIS — R509 Fever, unspecified: Secondary | ICD-10-CM | POA: Diagnosis not present

## 2019-05-25 DIAGNOSIS — Z8249 Family history of ischemic heart disease and other diseases of the circulatory system: Secondary | ICD-10-CM

## 2019-05-25 DIAGNOSIS — K7581 Nonalcoholic steatohepatitis (NASH): Secondary | ICD-10-CM | POA: Diagnosis not present

## 2019-05-25 DIAGNOSIS — R7989 Other specified abnormal findings of blood chemistry: Secondary | ICD-10-CM | POA: Diagnosis present

## 2019-05-25 DIAGNOSIS — E785 Hyperlipidemia, unspecified: Secondary | ICD-10-CM | POA: Diagnosis present

## 2019-05-25 DIAGNOSIS — Z886 Allergy status to analgesic agent status: Secondary | ICD-10-CM

## 2019-05-25 DIAGNOSIS — Z7902 Long term (current) use of antithrombotics/antiplatelets: Secondary | ICD-10-CM

## 2019-05-25 DIAGNOSIS — E039 Hypothyroidism, unspecified: Secondary | ICD-10-CM | POA: Diagnosis not present

## 2019-05-25 DIAGNOSIS — Z87891 Personal history of nicotine dependence: Secondary | ICD-10-CM

## 2019-05-25 DIAGNOSIS — I4821 Permanent atrial fibrillation: Secondary | ICD-10-CM | POA: Diagnosis present

## 2019-05-25 DIAGNOSIS — Z20822 Contact with and (suspected) exposure to covid-19: Secondary | ICD-10-CM | POA: Diagnosis present

## 2019-05-25 DIAGNOSIS — Z794 Long term (current) use of insulin: Secondary | ICD-10-CM

## 2019-05-25 DIAGNOSIS — I10 Essential (primary) hypertension: Secondary | ICD-10-CM | POA: Diagnosis not present

## 2019-05-25 DIAGNOSIS — L97411 Non-pressure chronic ulcer of right heel and midfoot limited to breakdown of skin: Secondary | ICD-10-CM | POA: Diagnosis not present

## 2019-05-25 DIAGNOSIS — Z89421 Acquired absence of other right toe(s): Secondary | ICD-10-CM

## 2019-05-25 DIAGNOSIS — Z885 Allergy status to narcotic agent status: Secondary | ICD-10-CM

## 2019-05-25 DIAGNOSIS — Z6834 Body mass index (BMI) 34.0-34.9, adult: Secondary | ICD-10-CM

## 2019-05-25 DIAGNOSIS — L97419 Non-pressure chronic ulcer of right heel and midfoot with unspecified severity: Secondary | ICD-10-CM | POA: Diagnosis present

## 2019-05-25 DIAGNOSIS — Z888 Allergy status to other drugs, medicaments and biological substances status: Secondary | ICD-10-CM

## 2019-05-25 DIAGNOSIS — L089 Local infection of the skin and subcutaneous tissue, unspecified: Secondary | ICD-10-CM | POA: Diagnosis not present

## 2019-05-25 LAB — CBC WITH DIFFERENTIAL/PLATELET
Abs Immature Granulocytes: 0.04 10*3/uL (ref 0.00–0.07)
Basophils Absolute: 0 10*3/uL (ref 0.0–0.1)
Basophils Relative: 0 %
Eosinophils Absolute: 0 10*3/uL (ref 0.0–0.5)
Eosinophils Relative: 1 %
HCT: 42.1 % (ref 36.0–46.0)
Hemoglobin: 14.2 g/dL (ref 12.0–15.0)
Immature Granulocytes: 1 %
Lymphocytes Relative: 11 %
Lymphs Abs: 0.7 10*3/uL (ref 0.7–4.0)
MCH: 34.3 pg — ABNORMAL HIGH (ref 26.0–34.0)
MCHC: 33.7 g/dL (ref 30.0–36.0)
MCV: 101.7 fL — ABNORMAL HIGH (ref 80.0–100.0)
Monocytes Absolute: 0.4 10*3/uL (ref 0.1–1.0)
Monocytes Relative: 7 %
Neutro Abs: 4.9 10*3/uL (ref 1.7–7.7)
Neutrophils Relative %: 80 %
Platelets: 71 10*3/uL — ABNORMAL LOW (ref 150–400)
RBC Morphology: NORMAL
RBC: 4.14 MIL/uL (ref 3.87–5.11)
RDW: 16 % — ABNORMAL HIGH (ref 11.5–15.5)
Smear Review: NORMAL
WBC: 6.1 10*3/uL (ref 4.0–10.5)
nRBC: 0 % (ref 0.0–0.2)

## 2019-05-25 LAB — COMPREHENSIVE METABOLIC PANEL
ALT: 135 U/L — ABNORMAL HIGH (ref 0–44)
AST: 163 U/L — ABNORMAL HIGH (ref 15–41)
Albumin: 3.7 g/dL (ref 3.5–5.0)
Alkaline Phosphatase: 218 U/L — ABNORMAL HIGH (ref 38–126)
Anion gap: 12 (ref 5–15)
BUN: 46 mg/dL — ABNORMAL HIGH (ref 8–23)
CO2: 25 mmol/L (ref 22–32)
Calcium: 9.7 mg/dL (ref 8.9–10.3)
Chloride: 94 mmol/L — ABNORMAL LOW (ref 98–111)
Creatinine, Ser: 1.1 mg/dL — ABNORMAL HIGH (ref 0.44–1.00)
GFR calc Af Amer: 59 mL/min — ABNORMAL LOW (ref >60)
GFR calc non Af Amer: 51 mL/min — ABNORMAL LOW (ref >60)
Glucose, Bld: 173 mg/dL — ABNORMAL HIGH (ref 70–99)
Potassium: 4 mmol/L (ref 3.5–5.1)
Sodium: 131 mmol/L — ABNORMAL LOW (ref 135–145)
Total Bilirubin: 1.1 mg/dL (ref 0.3–1.2)
Total Protein: 7.4 g/dL (ref 6.5–8.1)

## 2019-05-25 LAB — CBG MONITORING, ED: Glucose-Capillary: 168 mg/dL — ABNORMAL HIGH (ref 70–99)

## 2019-05-25 LAB — TROPONIN I (HIGH SENSITIVITY)
Troponin I (High Sensitivity): 31 ng/L — ABNORMAL HIGH (ref <18)
Troponin I (High Sensitivity): 33 ng/L — ABNORMAL HIGH (ref <18)
Troponin I (High Sensitivity): 32 ng/L — ABNORMAL HIGH (ref <18)

## 2019-05-25 LAB — LACTIC ACID, PLASMA: Lactic Acid, Venous: 1.6 mmol/L (ref 0.5–1.9)

## 2019-05-25 LAB — SARS CORONAVIRUS 2 (TAT 6-24 HRS): SARS Coronavirus 2: NEGATIVE

## 2019-05-25 MED ORDER — VANCOMYCIN HCL IN DEXTROSE 1-5 GM/200ML-% IV SOLN
1000.0000 mg | Freq: Once | INTRAVENOUS | Status: AC
  Administered 2019-05-25: 23:00:00 1000 mg via INTRAVENOUS
  Filled 2019-05-25: qty 200

## 2019-05-25 MED ORDER — PIPERACILLIN-TAZOBACTAM 3.375 G IVPB 30 MIN
3.3750 g | Freq: Once | INTRAVENOUS | Status: AC
  Administered 2019-05-25: 18:00:00 3.375 g via INTRAVENOUS
  Filled 2019-05-25 (×2): qty 50

## 2019-05-25 MED ORDER — SODIUM CHLORIDE 0.9 % IV SOLN: INTRAVENOUS | Status: DC

## 2019-05-25 NOTE — Progress Notes (Signed)
69 y/o F with hx of Afib, DM2, PAD, CHF, cirrhosis, presented to [facility] with what appears to be diabetic cellulitis of R foot, started on Zosyn. Followed by vascular and podiatry. Previously known R toe ulcer and had recent revascularization. Labs notable for mild AKI, elevated LFT's, troponin mildly positive at 31>33 over two hours though denying chest pain, stable thrombocytopenia. ECG largely unchanged from prior.   Asked to repeat troponins, start Vancomycin, accepted for [facility], admit ordered placed.

## 2019-05-25 NOTE — ED Triage Notes (Signed)
Pt c/o fever, chills, diarrhea x today-states she had surgery on right foot on 3/8 and 3/15-NAD-to triage in w/c

## 2019-05-25 NOTE — ED Provider Notes (Signed)
RN to call after labs drawn.

## 2019-05-25 NOTE — ED Notes (Signed)
repaged hospitalist via [place] at [phone]

## 2019-05-25 NOTE — ED Provider Notes (Signed)
[facility] EMERGENCY DEPARTMENT Provider Note   CSN: [id number] Arrival date & time: 05/25/19  1612     History Chief Complaint  Patient presents with  . Fever    Alexis [name] is a 69 y.o. female.  Patient with onset of fever today.  Patient was being treated for diabetic foot infection was supposed to start doxycycline a few days ago.  But just started it 2 days ago.  Fevers just started today.  Temp here was 99.9.  But temp was over 100.4 at home.  Patient without any other specific complaints.  States that her right foot is red.  She has an ulcer on the toe being followed by podiatry.  Patient had also by vascular surgery revascularization procedure done not too long ago to that foot.  No significant pain but patient has neuropathy.  No nausea or vomiting no chest pain no shortness of breath.  Patient has a history of atrial fibrillation.  Had ablation before.  Patient is on Plavix.  Past medical history otherwise significant for congestive heart failure COPD cirrhosis hypertension hyperlipidemia.  And patient has a pacemaker.  Currently RV lead and His bundle lead high threshold.        Past Medical History:  Diagnosis Date  . Allergy   . Anxiety   . Arthritis   . Asthma   . Atrial fibrillation ([place])   . CHF (congestive heart failure) ([place])   . Cirrhosis ([place])   . COPD (chronic obstructive pulmonary disease) ([place])   . Diabetes mellitus without complication ([place])   . Hyperlipidemia   . Hypertension   . Macrocytic anemia 09/04/2018  . Myocardial infarction ([place])    several  . Pacemaker    CRT-P with RV lead and His Bundle lead ( high threshold)   . Peripheral neuropathy   . Pneumonia   . PONV (postoperative nausea and vomiting)    unsure of exactly what happened at [facility] in 2010 (knee surgery) that led to crital care  . Thyroid disease     Patient Active Problem List   Diagnosis Date Noted  . Epigastric pain   . 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 Anemia due to chronic blood loss   . Paroxysmal atrial fibrillation ([place]) 09/26/2018  . Macrocytic anemia 09/04/2018  . Pacemaker 08/25/2018  . Insulin dependent diabetes mellitus with complications 95/10/3265  . Constipation 08/25/2018  . Fluid overload 08/25/2018  . Chronic anticoagulation 08/25/2018  . Morbid obesity ([place]) 08/25/2018  . Chronic diastolic heart failure ([place]) 05/17/2018  . Acute bronchitis with COPD ([place]) 01/16/2018  . Acquired absence of other right toe(s) ([place]) 11/16/2017  . Diabetic ulcer of toe of right foot associated with type 2 diabetes mellitus ([place]) 11/16/2017  . Crush injury of left foot, initial encounter 11/28/2016  . Osteomyelitis ([place]) 07/30/2016  . Gangrene of foot ([place]) 04/27/2016  . Thrombocytopenia ([place])   . Vomiting and diarrhea 04/05/2016  . Hyperlipidemia 11/18/2015  . Gout 08/05/2014  . Cough 08/05/2014  . Allergic rhinitis 07/12/2014  . Osteoarthritis 07/12/2014  . Asthma 07/12/2014  . Congestive heart failure ([place]) 07/12/2014  . Permanent atrial fibrillation ([place]) 07/12/2014  . HTN (hypertension) 07/12/2014  . Hypothyroidism 07/12/2014  . History of substance abuse ([place]) 07/12/2014  . Peripheral vascular disease ([place]) 04/02/2013  . Discoloration of skin 04/02/2013    Past Surgical History:  Procedure Laterality Date  . ABDOMINAL AORTOGRAM W/LOWER EXTREMITY N/A 04/29/2016   Procedure: Abdominal  Aortogram w/Lower Extremity;  Surgeon: Waynetta Sandy, MD;  Location: [facility];  Service: Cardiovascular;  Laterality: N/A;  . ABDOMINAL AORTOGRAM W/LOWER EXTREMITY N/A 05/06/2017   Procedure: ABDOMINAL AORTOGRAM W/LOWER EXTREMITY;  Surgeon: Angelia Mould, MD;  Location: [facility];  Service: Cardiovascular;  Laterality: N/A;  bilateral  . ABDOMINAL AORTOGRAM W/LOWER EXTREMITY Bilateral 05/04/2019   Procedure: ABDOMINAL AORTOGRAM W/LOWER EXTREMITY;  Surgeon: Angelia Mould, MD;  Location: [facility];  Service: Cardiovascular;  Laterality: Bilateral;  . ABDOMINAL HYSTERECTOMY    . AMPUTATION Right 07/30/2016   Procedure: AMPUTATION RIGHT SECOND TOE;  Surgeon: Angelia Mould, MD;  Location: [facility];  Service: Vascular;  Laterality: Right;  . CARDIAC CATHETERIZATION     2005 at [place] N/A 12/25/2018   Procedure: COLONOSCOPY WITH PROPOFOL;  Surgeon: Jackquline Denmark, MD;  Location: [facility];  Service: Endoscopy;  Laterality: N/A;  . ESOPHAGEAL BANDING  12/25/2018   Procedure: ESOPHAGEAL BANDING;  Surgeon: Jackquline Denmark, MD;  Location: [facility];  Service: Endoscopy;;  . ESOPHAGEAL BANDING  05/01/2019   Procedure: ESOPHAGEAL BANDING;  Surgeon: Jackquline Denmark, MD;  Location: [facility];  Service: Endoscopy;;  . ESOPHAGOGASTRODUODENOSCOPY (EGD) WITH PROPOFOL N/A 12/25/2018   Procedure: ESOPHAGOGASTRODUODENOSCOPY (EGD) WITH PROPOFOL;  Surgeon: Jackquline Denmark, MD;  [facility];  Service: Endoscopy;  Laterality: N/A;  . ESOPHAGOGASTRODUODENOSCOPY (EGD) WITH PROPOFOL N/A 05/01/2019   Procedure: ESOPHAGOGASTRODUODENOSCOPY (EGD) WITH PROPOFOL;  Surgeon: Jackquline Denmark, MD;  [facility];  Service: Endoscopy;  Laterality: N/A;  . LOWER EXTREMITY ANGIOGRAPHY Right 05/14/2019   Procedure: LOWER EXTREMITY ANGIOGRAPHY;  Surgeon: Waynetta Sandy, MD;  Location: [facility];  Service: Cardiovascular;  Laterality: Right;  . PERIPHERAL VASCULAR ATHERECTOMY Right 04/29/2016   Procedure: Peripheral Vascular Atherectomy-Right Popliteal;  Surgeon: Waynetta Sandy, MD;  Location: [facility];  Service: Cardiovascular;  Laterality: Right;  . PERIPHERAL VASCULAR ATHERECTOMY Right 05/14/2019   Procedure: PERIPHERAL VASCULAR ATHERECTOMY;  Surgeon: Waynetta Sandy, MD;  Location: [facility];  Service: Cardiovascular;  Laterality: Right;  right SFA/pop  . PERIPHERAL VASCULAR  INTERVENTION Right 04/29/2016   Procedure: Peripheral Vascular Intervention-Right Popliteal;  Surgeon: Waynetta Sandy, MD;  Location: [facility];  Service: Cardiovascular;  Laterality: Right;  POPLITEAL PTA  . PERIPHERAL VASCULAR INTERVENTION Right 05/14/2019   Procedure: PERIPHERAL VASCULAR INTERVENTION;  Surgeon: Waynetta Sandy, MD;  Location: [facility];  Service: Cardiovascular;  Laterality: Right;  popliteal stent  . RADIOFREQUENCY ABLATION    . TOTAL KNEE ARTHROPLASTY    . TUBAL LIGATION       OB History   No obstetric history on file.     Family History  Problem Relation Age of Onset  . Diabetes Mother   . Hyperlipidemia Mother   . Diabetes Father   . Hyperlipidemia Father   . Heart disease Father        before age 18  . Heart attack Father   . Diabetes Brother   . Hyperlipidemia Brother   . Heart attack Brother     Social History   Tobacco Use  . Smoking status: Former Smoker    Quit date: 03/01/1998    Years since quitting: 21.2  . Smokeless tobacco: Never Used  Substance Use Topics  . 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  . Drug use: Yes    Types: Marijuana    Comment: remote use    Home Medications Prior to Admission medications   Medication Sig Start Date End Date Taking? Authorizing Provider  acetaminophen (TYLENOL) 325 MG tablet Take 650 mg by mouth every 6 (six) hours as needed for moderate pain or headache.    [provider]  albuterol (VENTOLIN HFA) 108 (90 Base) MCG/ACT inhaler Inhale 2 puffs into the lungs every 6 (six) hours as needed for wheezing or shortness of breath.    [provider]  allopurinol (ZYLOPRIM) 100 MG tablet TAKE ONE TABLET BY MOUTH TWICE DAILY Patient taking differently: 100 mg 2 (two) times daily.  05/11/19   Saguier, Percell Miller, PA-C  AMBULATORY NON FORMULARY MEDICATION Motorized scooter.   Diagnosis: Osteoarthritis M19.90 09/10/16   Carollee Herter, Alferd Apa, DO  atorvastatin (LIPITOR) 10 MG tablet Take 1 tablet (10 mg total) by mouth daily. 05/04/19 05/03/20  Angelia Mould, MD  busPIRone (BUSPAR) 7.5 MG tablet Take 1 tablet (7.5 mg total) by mouth 2 (two) times daily. 05/15/19   Saguier, Percell Miller, PA-C  carbamazepine (TEGRETOL XR) 200 MG 12 hr tablet Take 200-400 mg by mouth See admin instructions. Take 200 mg at night for 7 days then take 400 mg at night 05/10/19   [provider]  clopidogrel (PLAVIX) 75 MG tablet Take 1 tablet (75 mg total) by mouth daily. 05/14/19 05/13/20  Waynetta Sandy, MD  Continuous Blood Gluc Sensor (FREESTYLE LIBRE 14 DAY SENSOR) MISC  05/20/18   [provider]  desvenlafaxine (PRISTIQ) 50 MG 24 hr tablet Take 50 mg by mouth at bedtime.  04/30/19   [provider]  dicloxacillin (DYNAPEN) 500 MG capsule Take 1 capsule (500 mg total) by mouth 3 (three) times daily. Patient not taking: Reported on 05/11/2019 04/26/19   Saguier, Percell Miller, PA-C  doxycycline (VIBRA-TABS) 100 MG tablet Take 1 tablet (100 mg total) by mouth 2 (two) times daily. 05/22/19   Trula Slade, DPM  fluticasone (FLONASE) 50 MCG/ACT nasal spray Place 2 sprays into both nostrils at bedtime. 07/31/16   Alvia Grove, PA-C  furosemide (LASIX) 20 MG tablet Take 2 tables in the morning and 1 tablet at noon Patient taking differently: Take 20-40 mg by mouth See admin instructions. Take 40 mg in the morning and 20 mg in the afternoon 11/13/18   Park Liter, MD  insulin degludec (TRESIBA) 100 UNIT/ML FlexTouch Pen Inject 10 Units into the skin daily.  04/30/19   [provider]  insulin lispro (HUMALOG) 100 UNIT/ML injection Inject into the skin 3 (three) times daily before meals.    [provider]  Insulin Pen Needle (PEN NEEDLES 31GX5/16") 31G X 8 MM MISC Use as needed 4 times/day.  Dx E11.65 04/30/19   [provider]    levocetirizine (XYZAL) 5 MG tablet Take 5 mg by mouth at bedtime. 03/15/19   [provider]  Levothyroxine Sodium 150 MCG CAPS Take 1 capsule (150 mcg total) by mouth daily before breakfast. 09/20/17   Saguier, Percell Miller, PA-C  Lidocaine HCl 4 % LIQD Apply 10 mLs topically 2 (two) times daily. Patient taking differently: Apply 10 mLs topically 2 (two) times daily as needed (pain).  03/18/19   Saguier, Percell Miller, PA-C  LORazepam (ATIVAN) 0.5 MG tablet Take 0.5 mg by mouth every 8 (eight) hours as needed for anxiety.    [provider]  losartan (COZAAR) 25 MG tablet TAKE ONE (  1) TABLET BY MOUTH EVERY DAY 05/14/19   Park Liter, MD  metoprolol succinate (TOPROL-XL) 25 MG 24 hr tablet TAKE ONE (1) TABLET BY MOUTH EACH DAY Patient taking differently: Take 25 mg by mouth daily.  09/11/18   Saguier, Percell Miller, PA-C  ondansetron (ZOFRAN-ODT) 4 MG disintegrating tablet Take 4 mg by mouth every 8 (eight) hours as needed for nausea or vomiting.  02/05/19   [provider]  Prenatal Vit-Fe Fumarate-FA (MULTIVITAMIN-PRENATAL) 27-0.8 MG TABS tablet Take 1 tablet by mouth daily.     [provider]  Probiotic CAPS Take 2 capsules by mouth daily.    [provider]  psyllium (METAMUCIL SMOOTH TEXTURE) 58.6 % powder Take 1 packet by mouth daily. Patient not taking: Reported on 05/11/2019 08/25/18   Erlene Quan, PA-C  RABEprazole (ACIPHEX) 20 MG tablet Take 1 tablet (20 mg total) by mouth daily. 05/01/19 04/30/20  Jackquline Denmark, MD  spironolactone (ALDACTONE) 50 MG tablet Take 1 tablet (50 mg total) by mouth daily. 01/15/19   Jackquline Denmark, MD    Allergies    Indomethacin, Pregabalin, Sulfa antibiotics, and Morphine and related  Review of Systems   Review of Systems  Constitutional: Negative for chills and fever.  HENT: Negative for congestion, rhinorrhea and sore throat.   Eyes: Negative for visual disturbance.  Respiratory: Negative for cough and shortness of breath.    Cardiovascular: Negative for chest pain and leg swelling.  Gastrointestinal: Negative for abdominal pain, diarrhea, nausea and vomiting.  Genitourinary: Negative for dysuria.  Musculoskeletal: Negative for back pain and neck pain.  Skin: Positive for wound. Negative for rash.  Neurological: Negative for dizziness, light-headedness and headaches.  Hematological: Does not bruise/bleed easily.  Psychiatric/Behavioral: Negative for confusion.    Physical Exam Updated Vital Signs BP 98/68 (BP Location: Right Arm)   Pulse 74   Temp 99.9 F (37.7 C) (Oral)   Resp (!) 32   Ht 1.575 m (5' 2" )   Wt 86.2 kg   SpO2 96%   BMI 34.75 kg/m   Physical Exam Vitals and nursing note reviewed.  Constitutional:      General: She is not in acute distress.    Appearance: Normal appearance. She is well-developed.  HENT:     Head: Normocephalic and atraumatic.  Eyes:     Conjunctiva/sclera: Conjunctivae normal.     Pupils: Pupils are equal, round, and reactive to light.  Cardiovascular:     Rate and Rhythm: Tachycardia present. Rhythm irregular.     Heart sounds: No murmur.     Comments: Heart rate irregular low 100s. Pulmonary:     Effort: Pulmonary effort is normal. No respiratory distress.     Breath sounds: Normal breath sounds.  Abdominal:     Palpations: Abdomen is soft.     Tenderness: There is no abdominal tenderness.  Musculoskeletal:        General: No tenderness.     Cervical back: Neck supple.     Comments: Right foot good cap refill.  But has erythema increased warmth.  Normally her feet are cool.  Definitely warmer than her left foot.  No significant pitting edema.  But does have a little bit of swelling.  Patient has an ulcer on the base of the great toe.  Missing the third toe.  Skin:    General: Skin is warm and dry.  Neurological:     General: No focal deficit present.     Mental Status: She is alert and  oriented to person, place, and time.     Sensory: Sensory deficit  present.     ED Results / Procedures / Treatments   Labs (all labs ordered are listed, but only abnormal results are displayed) Labs Reviewed  COMPREHENSIVE METABOLIC PANEL - Abnormal; Notable for the following components:      Result Value   Sodium 131 (*)    Chloride 94 (*)    Glucose, Bld 173 (*)    BUN 46 (*)    Creatinine, Ser 1.10 (*)    AST 163 (*)    ALT 135 (*)    Alkaline Phosphatase 218 (*)    GFR calc non Af Amer 51 (*)    GFR calc Af Amer 59 (*)    All other components within normal limits  CBC WITH DIFFERENTIAL/PLATELET - Abnormal; Notable for the following components:   MCV 101.7 (*)    MCH 34.3 (*)    RDW 16.0 (*)    Platelets 71 (*)    All other components within normal limits  CBG MONITORING, ED - Abnormal; Notable for the following components:   Glucose-Capillary 168 (*)    All other components within normal limits  TROPONIN I (HIGH SENSITIVITY) - Abnormal; Notable for the following components:   Troponin I (High Sensitivity) 31 (*)    All other components within normal limits  TROPONIN I (HIGH SENSITIVITY) - Abnormal; Notable for the following components:   Troponin I (High Sensitivity) 33 (*)    All other components within normal limits  CULTURE, BLOOD (ROUTINE X 2)  CULTURE, BLOOD (ROUTINE X 2)  SARS CORONAVIRUS 2 (TAT 6-24 HRS)  LACTIC ACID, PLASMA    EKG None  Radiology DG Chest Port 1 View  Result Date: 05/25/2019 CLINICAL DATA:  Fever and chills EXAM: PORTABLE CHEST 1 VIEW COMPARISON:  November 08, 2018 FINDINGS: Again noted is mild cardiomegaly. A left-sided pacemaker seen with the lead tips in the right atrium right ventricle. There is prominence of the central pulmonary vasculature. No large airspace consolidation or pleural effusion. No acute osseous abnormality. IMPRESSION: Cardiomegaly and pulmonary vascular congestion. Electronically Signed   By: Prudencio Pair M.D.   On: 05/25/2019 19:24   DG Foot Complete Right  Result Date:  05/25/2019 CLINICAL DATA:  Infection. EXAM: RIGHT FOOT COMPLETE - 3+ VIEW COMPARISON:  None. FINDINGS: The patient is status post prior amputation through the proximal aspect of the proximal phalanx of the second digit. There is no convincing radiographic evidence for osteomyelitis. There is no acute displaced fracture or dislocation. There may be some mild soft tissue swelling without evidence for associated subcutaneous gas. Vascular calcifications are noted. There is a small plantar calcaneal spur. There is an Achilles tendon enthesophyte. IMPRESSION: 1. No acute displaced fracture or dislocation. No radiographic evidence for osteomyelitis. 2. Postsurgical changes as above. Electronically Signed   By: Constance Holster M.D.   On: 05/25/2019 19:25    Procedures Procedures (including critical care time)  CRITICAL CARE Performed by: Fredia Sorrow Total critical care time: 30 minutes Critical care time was exclusive of separately billable procedures and treating other patients. Critical care was necessary to treat or prevent imminent or life-threatening deterioration. Critical care was time spent personally by me on the following activities: development of treatment plan with patient and/or surrogate as well as nursing, discussions with consultants, evaluation of patient's response to treatment, examination of patient, obtaining history from patient or surrogate, ordering and performing treatments and interventions, ordering and review of  laboratory studies, ordering and review of radiographic studies, pulse oximetry and re-evaluation of patient's condition.   Medications Ordered in ED Medications  0.9 %  sodium chloride infusion ( Intravenous New Bag/Given 05/25/19 1750)  piperacillin-tazobactam (ZOSYN) IVPB 3.375 g (0 g Intravenous Stopped 05/25/19 1952)    ED Course  I have reviewed the triage vital signs and the nursing notes.  Pertinent labs & imaging results that were available during my  care of the patient were reviewed by me and considered in my medical decision making (see chart for details).    MDM Rules/Calculators/A&P                      Patient's vital signs not meeting strict sepsis criteria.  But a little bit of concern for perhaps a preseptic picture.  With her heart rate up some.  Never hypotensive.  Initially had 2 blood pressures when out in triage that were low.  But since she was been back in the room and I was involved her initial one was 353 systolic.  And they have remained in that ballpark.  Heart rates been in the low 100s.  Covid testing is pending.  Patient started on broad-spectrum antibiotics initially Zosyn for diabetic cellulitis.  And vancomycin was added.  Patient without any chest pain.  But initial troponin was 31.  Repeat 2-hour troponin was 33 so no significant delta change.  Patient without a leukocytosis.  Labs as far as electrolytes unchanged from baseline.  Chest x-ray was negative.  Patient EKG had a little bit of subtle ST changes but nothing acute.  And again patient's had no chest pain I think with 2 - troponins unlikely that there is an acute cardiac component.  Patient will be mated to telemetry.  As this could be early sepsis.  In addition x-ray of the right foot showed no bony problems or any evidence of gas in the soft tissue.   Final Clinical Impression(s) / ED Diagnoses Final diagnoses:  Cellulitis in diabetic foot [facility])    Rx / DC Orders ED Discharge Orders    None       Fredia Sorrow, MD 05/25/19 2332

## 2019-05-26 ENCOUNTER — Encounter (HOSPITAL_COMMUNITY): Payer: Self-pay | Admitting: Family Medicine

## 2019-05-26 DIAGNOSIS — I5022 Chronic systolic (congestive) heart failure: Secondary | ICD-10-CM

## 2019-05-26 DIAGNOSIS — L0390 Cellulitis, unspecified: Secondary | ICD-10-CM | POA: Diagnosis present

## 2019-05-26 DIAGNOSIS — F32A Depression, unspecified: Secondary | ICD-10-CM

## 2019-05-26 DIAGNOSIS — I739 Peripheral vascular disease, unspecified: Secondary | ICD-10-CM

## 2019-05-26 DIAGNOSIS — E11628 Type 2 diabetes mellitus with other skin complications: Principal | ICD-10-CM

## 2019-05-26 DIAGNOSIS — L03119 Cellulitis of unspecified part of limb: Secondary | ICD-10-CM

## 2019-05-26 DIAGNOSIS — K7469 Other cirrhosis of liver: Secondary | ICD-10-CM | POA: Diagnosis present

## 2019-05-26 DIAGNOSIS — F329 Major depressive disorder, single episode, unspecified: Secondary | ICD-10-CM

## 2019-05-26 HISTORY — DX: Depression, unspecified: F32.A

## 2019-05-26 LAB — CBG MONITORING, ED
Glucose-Capillary: 179 mg/dL — ABNORMAL HIGH (ref 70–99)
Glucose-Capillary: 235 mg/dL — ABNORMAL HIGH (ref 70–99)

## 2019-05-26 LAB — MRSA PCR SCREENING: MRSA by PCR: NEGATIVE

## 2019-05-26 LAB — GLUCOSE, CAPILLARY
Glucose-Capillary: 161 mg/dL — ABNORMAL HIGH (ref 70–99)
Glucose-Capillary: 156 mg/dL — ABNORMAL HIGH (ref 70–99)

## 2019-05-26 LAB — TROPONIN I (HIGH SENSITIVITY): Troponin I (High Sensitivity): 33 ng/L — ABNORMAL HIGH (ref <18)

## 2019-05-26 LAB — HEMOGLOBIN A1C
Hgb A1c MFr Bld: 7.7 % — ABNORMAL HIGH (ref 4.8–5.6)
Mean Plasma Glucose: 174.29 mg/dL

## 2019-05-26 MED ORDER — TAB-A-VITE/IRON PO TABS
1.0000 | ORAL_TABLET | Freq: Every day | ORAL | Status: DC
  Administered 2019-05-27 – 2019-05-29 (×3): 1 via ORAL
  Filled 2019-05-26 (×3): qty 1

## 2019-05-26 MED ORDER — VENLAFAXINE HCL ER 37.5 MG PO CP24: 37.5000 mg | ORAL_CAPSULE | Freq: Every day | ORAL | Status: DC

## 2019-05-26 MED ORDER — ATORVASTATIN CALCIUM 10 MG PO TABS
10.0000 mg | ORAL_TABLET | Freq: Every day | ORAL | Status: DC
  Administered 2019-05-27 – 2019-05-28 (×2): 10 mg via ORAL
  Filled 2019-05-26 (×2): qty 1

## 2019-05-26 MED ORDER — ALLOPURINOL 100 MG PO TABS
100.0000 mg | ORAL_TABLET | Freq: Two times a day (BID) | ORAL | Status: DC
  Administered 2019-05-27 – 2019-05-29 (×6): 100 mg via ORAL
  Filled 2019-05-26 (×6): qty 1

## 2019-05-26 MED ORDER — LEVOTHYROXINE SODIUM 150 MCG PO TABS
150.0000 ug | ORAL_TABLET | Freq: Every day | ORAL | Status: DC
  Administered 2019-05-27 – 2019-05-29 (×3): 150 ug via ORAL
  Filled 2019-05-26 (×3): qty 1

## 2019-05-26 MED ORDER — ACETAMINOPHEN 325 MG PO TABS
650.0000 mg | ORAL_TABLET | Freq: Four times a day (QID) | ORAL | Status: DC | PRN
  Administered 2019-05-27 (×2): 650 mg via ORAL
  Filled 2019-05-26 (×2): qty 2

## 2019-05-26 MED ORDER — METOPROLOL SUCCINATE ER 25 MG PO TB24
25.0000 mg | ORAL_TABLET | Freq: Every day | ORAL | Status: DC
  Administered 2019-05-27 – 2019-05-29 (×3): 25 mg via ORAL
  Filled 2019-05-26 (×3): qty 1

## 2019-05-26 MED ORDER — ONDANSETRON HCL 4 MG/2ML IJ SOLN: 4.0000 mg | Freq: Four times a day (QID) | INTRAMUSCULAR | Status: DC | PRN

## 2019-05-26 MED ORDER — VANCOMYCIN HCL 750 MG/150ML IV SOLN
750.0000 mg | INTRAVENOUS | Status: DC
  Administered 2019-05-27: 02:00:00 750 mg via INTRAVENOUS
  Filled 2019-05-26 (×3): qty 150

## 2019-05-26 MED ORDER — INSULIN DEGLUDEC 100 UNIT/ML ~~LOC~~ SOPN: 10.0000 [IU] | PEN_INJECTOR | Freq: Every day | SUBCUTANEOUS | Status: DC

## 2019-05-26 MED ORDER — LOSARTAN POTASSIUM 50 MG PO TABS: 25.0000 mg | ORAL_TABLET | Freq: Every day | ORAL | Status: DC

## 2019-05-26 MED ORDER — RISAQUAD PO CAPS
2.0000 | ORAL_CAPSULE | Freq: Every day | ORAL | Status: DC
  Administered 2019-05-27 – 2019-05-29 (×3): 2 via ORAL
  Filled 2019-05-26 (×4): qty 2

## 2019-05-26 MED ORDER — CARBAMAZEPINE ER 200 MG PO TB12: 200.0000 mg | ORAL_TABLET | ORAL | Status: DC

## 2019-05-26 MED ORDER — INSULIN ASPART 100 UNIT/ML ~~LOC~~ SOLN
0.0000 [IU] | Freq: Three times a day (TID) | SUBCUTANEOUS | Status: DC
  Administered 2019-05-26 – 2019-05-27 (×3): 3 [IU] via SUBCUTANEOUS
  Administered 2019-05-27: 13:00:00 5 [IU] via SUBCUTANEOUS
  Administered 2019-05-28 (×2): 2 [IU] via SUBCUTANEOUS
  Administered 2019-05-28: 12:00:00 3 [IU] via SUBCUTANEOUS
  Administered 2019-05-29 (×2): 2 [IU] via SUBCUTANEOUS

## 2019-05-26 MED ORDER — FLUTICASONE PROPIONATE 50 MCG/ACT NA SUSP
2.0000 | Freq: Every day | NASAL | Status: DC
  Administered 2019-05-27 – 2019-05-28 (×3): 2 via NASAL
  Filled 2019-05-26: qty 16

## 2019-05-26 MED ORDER — ONDANSETRON HCL 4 MG PO TABS: 4.0000 mg | ORAL_TABLET | Freq: Four times a day (QID) | ORAL | Status: DC | PRN

## 2019-05-26 MED ORDER — FUROSEMIDE 20 MG PO TABS
40.0000 mg | ORAL_TABLET | Freq: Every day | ORAL | Status: DC
  Administered 2019-05-27 – 2019-05-29 (×3): 40 mg via ORAL
  Filled 2019-05-26 (×3): qty 2

## 2019-05-26 MED ORDER — PANTOPRAZOLE SODIUM 40 MG PO TBEC
40.0000 mg | DELAYED_RELEASE_TABLET | Freq: Every day | ORAL | Status: DC
  Administered 2019-05-27 – 2019-05-28 (×2): 40 mg via ORAL
  Filled 2019-05-26 (×3): qty 1

## 2019-05-26 MED ORDER — BUSPIRONE HCL 5 MG PO TABS
7.5000 mg | ORAL_TABLET | Freq: Two times a day (BID) | ORAL | Status: DC
  Administered 2019-05-27 – 2019-05-29 (×5): 7.5 mg via ORAL
  Filled 2019-05-26 (×6): qty 2

## 2019-05-26 MED ORDER — CHLORHEXIDINE GLUCONATE CLOTH 2 % EX PADS
6.0000 | MEDICATED_PAD | Freq: Every day | CUTANEOUS | Status: DC
  Administered 2019-05-26 – 2019-05-29 (×4): 6 via TOPICAL

## 2019-05-26 MED ORDER — PSYLLIUM 95 % PO PACK
1.0000 | PACK | Freq: Every day | ORAL | Status: DC
  Filled 2019-05-26 (×3): qty 1

## 2019-05-26 MED ORDER — PIPERACILLIN-TAZOBACTAM 3.375 G IVPB: 3.3750 g | Freq: Three times a day (TID) | INTRAVENOUS | Status: DC

## 2019-05-26 MED ORDER — CEFAZOLIN SODIUM-DEXTROSE 1-4 GM/50ML-% IV SOLN
1.0000 g | Freq: Three times a day (TID) | INTRAVENOUS | Status: DC
  Administered 2019-05-27 – 2019-05-29 (×8): 1 g via INTRAVENOUS
  Filled 2019-05-26 (×11): qty 50

## 2019-05-26 MED ORDER — LORATADINE 10 MG PO TABS
10.0000 mg | ORAL_TABLET | Freq: Every day | ORAL | Status: DC
  Administered 2019-05-27 – 2019-05-28 (×3): 10 mg via ORAL
  Filled 2019-05-26 (×3): qty 1

## 2019-05-26 MED ORDER — INSULIN GLARGINE 100 UNIT/ML ~~LOC~~ SOLN
10.0000 [IU] | Freq: Every day | SUBCUTANEOUS | Status: DC
  Administered 2019-05-27 – 2019-05-28 (×2): 10 [IU] via SUBCUTANEOUS
  Filled 2019-05-26 (×2): qty 0.1

## 2019-05-26 MED ORDER — SPIRONOLACTONE 25 MG PO TABS
50.0000 mg | ORAL_TABLET | Freq: Every day | ORAL | Status: DC
  Administered 2019-05-27 – 2019-05-29 (×3): 50 mg via ORAL
  Filled 2019-05-26 (×3): qty 2

## 2019-05-26 MED ORDER — CLOPIDOGREL BISULFATE 75 MG PO TABS
75.0000 mg | ORAL_TABLET | Freq: Every day | ORAL | Status: DC
  Administered 2019-05-27 – 2019-05-29 (×3): 75 mg via ORAL
  Filled 2019-05-26 (×3): qty 1

## 2019-05-26 MED ORDER — PIPERACILLIN-TAZOBACTAM 3.375 G IVPB 30 MIN
3.3750 g | Freq: Three times a day (TID) | INTRAVENOUS | Status: DC
  Filled 2019-05-26 (×2): qty 50

## 2019-05-26 MED ORDER — ALBUTEROL SULFATE (2.5 MG/3ML) 0.083% IN NEBU: 2.5000 mg | INHALATION_SOLUTION | Freq: Four times a day (QID) | RESPIRATORY_TRACT | Status: DC | PRN

## 2019-05-26 MED ORDER — LORAZEPAM 0.5 MG PO TABS: 0.5000 mg | ORAL_TABLET | Freq: Three times a day (TID) | ORAL | Status: DC | PRN

## 2019-05-26 MED ORDER — FUROSEMIDE 20 MG PO TABS
20.0000 mg | ORAL_TABLET | Freq: Every day | ORAL | Status: DC
  Administered 2019-05-27 – 2019-05-28 (×2): 20 mg via ORAL
  Filled 2019-05-26 (×3): qty 1

## 2019-05-26 MED ORDER — INSULIN ASPART 100 UNIT/ML ~~LOC~~ SOLN
4.0000 [IU] | Freq: Three times a day (TID) | SUBCUTANEOUS | Status: DC
  Administered 2019-05-26 – 2019-05-29 (×9): 4 [IU] via SUBCUTANEOUS

## 2019-05-26 NOTE — Progress Notes (Signed)
Pharmacy Antibiotic Note  Alexis [name] is a 69 y.o. female admitted on 05/25/2019 with cellulitis.  Pharmacy has been consulted for Vancomycin dosing.   Height: 5' 2"  (157.5 cm) Weight: 190 lb (86.2 kg) IBW/kg (Calculated) : 50.1  Temp (24hrs), Avg:99.9 F (37.7 C), Min:99.9 F (37.7 C), Max:99.9 F (37.7 C)  Recent Labs  Lab 05/23/19 1338 05/25/19 1739  WBC  --  6.1  CREATININE 0.96 1.10*  LATICACIDVEN  --  1.6    Estimated Creatinine Clearance: 48.5 mL/min (A) (by C-G formula based on SCr of 1.1 mg/dL (H)).    Allergies  Allergen Reactions  . Indomethacin Other (See Comments)    Renal Insufficiency  . Pregabalin Other (See Comments)    DIZZINESS   . Sulfa Antibiotics Hives  . Morphine And Related Nausea And Vomiting    Antimicrobials this admission: 3/27 Zosyn >>  3/27 Vancomycin >>   Dose adjustments this admission: N/a  Microbiology results: Pending   Plan:  - Zosyn 3.375g IV x 1 dose over 30 min followed by Zosyn 3.375g IV every 8 hours infused over 4 hours  - Vancomycin 1034m IV x 1 dose given in ED - Followed by Vancomycin 7540mIV q24h - Est Calc AUC 488 - Monitor patients renal function and urine output  - De-escalate ABX when appropriate   *[facility]30 may need to adjust to 1000 mg dose   Thank you for allowing pharmacy to be a part of this patient's care.  JaDuanne LimerickharmD. BCPS 05/26/2019 11:11 AM

## 2019-05-26 NOTE — H&P (Addendum)
History and Physical    Alexis [name] KYH:[id number] DOB: Mar 04, 1950 DOA: 05/25/2019  PCP: Mackie Pai, PA-C   Patient coming from: Home  I have personally briefly reviewed patient's old medical records in [place]  Chief Complaint:   HPI: Alexis [name] is a 69 y.o. female with medical history significant for insulin-dependent diabetes mellitus, hypertension, atrial fibrillation, coronary artery disease and peripheral arterial disease status post amputation of the right second toe who was transferred from the ER at [facility] for treatment of cellulitis involving the right foot and diabetic foot infection involving the right great toe.  Patient has been seen by podiatry and is status post debridement of the right great toe.  She states that she was supposed to start antibiotic, doxycycline but that it was not called in.  She developed a fever and redness involving the right foot presented to the emergency room.  Her T-max was 99.66F in the ER but per patient it was 100.66F at home.  Denies having any cough, no chest pain, no shortness of breath, no dizziness or lightheadedness.  Denies having any abdominal pain, no nausea, no vomiting or diarrhea.  Has no urinary symptoms She had an x-ray of the right foot which showed no acute displaced fracture or dislocation. No radiographic evidence for osteomyelitis  ED Course: Patient presented to the emergency room for evaluation of a fever. Patient's vital signs not meeting strict sepsis criteria.  But a little bit of concern for perhaps a preseptic picture.  With her heart rate up some.  Never hypotensive.  Initially had 2 blood pressures when out in triage that were low.  But since she was been back in the room  her initial one was 151 systolic.  And they have remained in that ballpark.  Heart rates been in the low 100s.  Covid testing is pending.  Patient started on broad-spectrum antibiotics initially Zosyn for diabetic cellulitis.  And  vancomycin was added.  Patient without any chest pain.  But initial troponin was 31.  Repeat 2-hour troponin was 33 so no significant delta change.  Patient without a leukocytosis.  Labs as far as electrolytes unchanged from baseline.  Chest x-ray was negative.  Patient EKG had a little bit of subtle ST changes but nothing acute.  And again patient's had no chest pain I think with 2 - troponins unlikely that there is an acute cardiac component.  Patient will be admitted to telemetry.  As this could be early sepsis.  In addition x-ray of the right foot showed no bony problems or any evidence of gas in the soft tissue.   Review of Systems: As per HPI otherwise 10 point review of systems negative.    Past Medical History:  Diagnosis Date  . Allergy   . Anxiety   . Arthritis   . Asthma   . Atrial fibrillation ([place])   . CHF (congestive heart failure) ([place])   . Cirrhosis ([place])   . COPD (chronic obstructive pulmonary disease) ([place])   . Depression 05/26/2019  . Diabetes mellitus without complication ([place])   . Hyperlipidemia   . Hypertension   . Macrocytic anemia 09/04/2018  . Myocardial infarction ([place])    several  . Pacemaker    CRT-P with RV lead and His Bundle lead ( high threshold)   . Peripheral neuropathy   . Pneumonia   . PONV (postoperative nausea and vomiting)    unsure of exactly what happened at [facility] in  2010 (knee surgery) that led to crital care  . Thyroid disease     Past Surgical History:  Procedure Laterality Date  . ABDOMINAL AORTOGRAM W/LOWER EXTREMITY N/A 04/29/2016   Procedure: Abdominal Aortogram w/Lower Extremity;  Surgeon: Waynetta Sandy, MD;  Location: [place] [facility];  Service: Cardiovascular;  Laterality: N/A;  . ABDOMINAL AORTOGRAM W/LOWER EXTREMITY N/A 05/06/2017   Procedure: ABDOMINAL AORTOGRAM W/LOWER EXTREMITY;  Surgeon: Angelia Mould, MD;  Location: [place] [facility];  Service: Cardiovascular;  Laterality: N/A;  bilateral    . ABDOMINAL AORTOGRAM W/LOWER EXTREMITY Bilateral 05/04/2019   Procedure: ABDOMINAL AORTOGRAM W/LOWER EXTREMITY;  Surgeon: Angelia Mould, MD;  Location: [place] [facility];  Service: Cardiovascular;  Laterality: Bilateral;  . ABDOMINAL HYSTERECTOMY    . AMPUTATION Right 07/30/2016   Procedure: AMPUTATION RIGHT SECOND TOE;  Surgeon: Angelia Mould, MD;  Location: [place];  Service: Vascular;  Laterality: Right;  . CARDIAC CATHETERIZATION     2005 at [place] N/A 12/25/2018   Procedure: COLONOSCOPY WITH PROPOFOL;  Surgeon: Jackquline Denmark, MD;  Location: [facility];  Service: Endoscopy;  Laterality: N/A;  . ESOPHAGEAL BANDING  12/25/2018   Procedure: ESOPHAGEAL BANDING;  Surgeon: Jackquline Denmark, MD;  Location: [facility];  Service: Endoscopy;;  . ESOPHAGEAL BANDING  05/01/2019   Procedure: ESOPHAGEAL BANDING;  Surgeon: Jackquline Denmark, MD;  Location: [facility];  Service: Endoscopy;;  . ESOPHAGOGASTRODUODENOSCOPY (EGD) WITH PROPOFOL N/A 12/25/2018   Procedure: ESOPHAGOGASTRODUODENOSCOPY (EGD) WITH PROPOFOL;  Surgeon: Jackquline Denmark, MD;  Location: [facility];  Service: Endoscopy;  Laterality: N/A;  . ESOPHAGOGASTRODUODENOSCOPY (EGD) WITH PROPOFOL N/A 05/01/2019   Procedure: ESOPHAGOGASTRODUODENOSCOPY (EGD) WITH PROPOFOL;  Surgeon: Jackquline Denmark, MD;  [facility];  Service: Endoscopy;  Laterality: N/A;  . LOWER EXTREMITY ANGIOGRAPHY Right 05/14/2019   Procedure: LOWER EXTREMITY ANGIOGRAPHY;  Surgeon: Waynetta Sandy, MD;  Location: [place] [facility];  Service: Cardiovascular;  Laterality: Right;  . PERIPHERAL VASCULAR ATHERECTOMY Right 04/29/2016   Procedure: Peripheral Vascular Atherectomy-Right Popliteal;  Surgeon: Waynetta Sandy, MD;  Location: [place] [facility];  Service: Cardiovascular;  Laterality: Right;  . PERIPHERAL VASCULAR ATHERECTOMY Right 05/14/2019   Procedure: PERIPHERAL VASCULAR ATHERECTOMY;  Surgeon:  Waynetta Sandy, MD;  Location: [place] [facility];  Service: Cardiovascular;  Laterality: Right;  right SFA/pop  . PERIPHERAL VASCULAR INTERVENTION Right 04/29/2016   Procedure: Peripheral Vascular Intervention-Right Popliteal;  Surgeon: Waynetta Sandy, MD;  Location: [place] [facility];  Service: Cardiovascular;  Laterality: Right;  POPLITEAL PTA  . PERIPHERAL VASCULAR INTERVENTION Right 05/14/2019   Procedure: PERIPHERAL VASCULAR INTERVENTION;  Surgeon: Waynetta Sandy, MD;  Location: [place] [facility];  Service: Cardiovascular;  Laterality: Right;  popliteal stent  . RADIOFREQUENCY ABLATION    . TOTAL KNEE ARTHROPLASTY    . TUBAL LIGATION       reports that she quit smoking about 21 years ago. She has never used smokeless tobacco. She reports current drug use. Drug: Marijuana. She reports that she does not drink alcohol.  Allergies  Allergen Reactions  . Indomethacin Other (See Comments)    Renal Insufficiency  . Pregabalin Other (See Comments)    DIZZINESS   . Sulfa Antibiotics Hives  . Morphine And Related Nausea And Vomiting    Family History  Problem Relation Age of Onset  . Diabetes Mother   . Hyperlipidemia Mother   . Diabetes Father   . Hyperlipidemia Father   .  Heart disease Father        before age 28  . Heart attack Father   . Diabetes Brother   . Hyperlipidemia Brother   . Heart attack Brother      Prior to Admission medications   Medication Sig Start Date End Date Taking? Authorizing Provider  acetaminophen (TYLENOL) 325 MG tablet Take 650 mg by mouth every 6 (six) hours as needed for moderate pain or headache.    [provider]  albuterol (VENTOLIN HFA) 108 (90 Base) MCG/ACT inhaler Inhale 2 puffs into the lungs every 6 (six) hours as needed for wheezing or shortness of breath.    [provider]  allopurinol (ZYLOPRIM) 100 MG tablet TAKE ONE TABLET BY MOUTH TWICE DAILY Patient taking differently: 100 mg 2  (two) times daily.  05/11/19   Saguier, Percell Miller, PA-C  AMBULATORY NON FORMULARY MEDICATION Motorized scooter.  Diagnosis: Osteoarthritis M19.90 09/10/16   Carollee Herter, Alferd Apa, DO  atorvastatin (LIPITOR) 10 MG tablet Take 1 tablet (10 mg total) by mouth daily. 05/04/19 05/03/20  Angelia Mould, MD  busPIRone (BUSPAR) 7.5 MG tablet Take 1 tablet (7.5 mg total) by mouth 2 (two) times daily. 05/15/19   Saguier, Percell Miller, PA-C  carbamazepine (TEGRETOL XR) 200 MG 12 hr tablet Take 200-400 mg by mouth See admin instructions. Take 200 mg at night for 7 days then take 400 mg at night 05/10/19   [provider]  clopidogrel (PLAVIX) 75 MG tablet Take 1 tablet (75 mg total) by mouth daily. 05/14/19 05/13/20  Waynetta Sandy, MD  Continuous Blood Gluc Sensor (FREESTYLE LIBRE 14 DAY SENSOR) MISC  05/20/18   [provider]  desvenlafaxine (PRISTIQ) 50 MG 24 hr tablet Take 50 mg by mouth at bedtime.  04/30/19   [provider]  dicloxacillin (DYNAPEN) 500 MG capsule Take 1 capsule (500 mg total) by mouth 3 (three) times daily. Patient not taking: Reported on 05/11/2019 04/26/19   Saguier, Percell Miller, PA-C  doxycycline (VIBRA-TABS) 100 MG tablet Take 1 tablet (100 mg total) by mouth 2 (two) times daily. 05/22/19   Trula Slade, DPM  fluticasone (FLONASE) 50 MCG/ACT nasal spray Place 2 sprays into both nostrils at bedtime. 07/31/16   Alvia Grove, PA-C  furosemide (LASIX) 20 MG tablet Take 2 tables in the morning and 1 tablet at noon Patient taking differently: Take 20-40 mg by mouth See admin instructions. Take 40 mg in the morning and 20 mg in the afternoon 11/13/18   Park Liter, MD  insulin degludec (TRESIBA) 100 UNIT/ML FlexTouch Pen Inject 10 Units into the skin daily.  04/30/19   [provider]  insulin lispro (HUMALOG) 100 UNIT/ML injection Inject into the skin 3 (three) times daily before meals.    [provider]  Insulin Pen Needle (PEN NEEDLES  31GX5/16") 31G X 8 MM MISC Use as needed 4 times/day.  Dx E11.65 04/30/19   [provider]  levocetirizine (XYZAL) 5 MG tablet Take 5 mg by mouth at bedtime. 03/15/19   [provider]  Levothyroxine Sodium 150 MCG CAPS Take 1 capsule (150 mcg total) by mouth daily before breakfast. 09/20/17   Saguier, Percell Miller, PA-C  Lidocaine HCl 4 % LIQD Apply 10 mLs topically 2 (two) times daily. Patient taking differently: Apply 10 mLs topically 2 (two) times daily as needed (pain).  03/18/19   Saguier, Percell Miller, PA-C  LORazepam (ATIVAN) 0.5 MG tablet Take 0.5 mg by mouth every 8 (eight) hours as needed for  anxiety.    [provider]  losartan (COZAAR) 25 MG tablet TAKE ONE (1) TABLET BY MOUTH EVERY DAY 05/14/19   Park Liter, MD  metoprolol succinate (TOPROL-XL) 25 MG 24 hr tablet TAKE ONE (1) TABLET BY MOUTH EACH DAY Patient taking differently: Take 25 mg by mouth daily.  09/11/18   Saguier, Percell Miller, PA-C  ondansetron (ZOFRAN-ODT) 4 MG disintegrating tablet Take 4 mg by mouth every 8 (eight) hours as needed for nausea or vomiting.  02/05/19   [provider]  Prenatal Vit-Fe Fumarate-FA (MULTIVITAMIN-PRENATAL) 27-0.8 MG TABS tablet Take 1 tablet by mouth daily.     [provider]  Probiotic CAPS Take 2 capsules by mouth daily.    [provider]  psyllium (METAMUCIL SMOOTH TEXTURE) 58.6 % powder Take 1 packet by mouth daily. Patient not taking: Reported on 05/11/2019 08/25/18   Erlene Quan, PA-C  RABEprazole (ACIPHEX) 20 MG tablet Take 1 tablet (20 mg total) by mouth daily. 05/01/19 04/30/20  Jackquline Denmark, MD  spironolactone (ALDACTONE) 50 MG tablet Take 1 tablet (50 mg total) by mouth daily. 01/15/19   Jackquline Denmark, MD    Physical Exam: Vitals:   05/26/19 1316 05/26/19 1330 05/26/19 1351 05/26/19 1500  BP: 109/60 92/66  (!) 148/124  Pulse: 61 79  (!) 36  Resp: 19 19  17   Temp:   100.2 F (37.9 C)   TempSrc:   Oral   SpO2: 100% 95%  100%  Weight:       Height:         Vitals:   05/26/19 1316 05/26/19 1330 05/26/19 1351 05/26/19 1500  BP: 109/60 92/66  (!) 148/124  Pulse: 61 79  (!) 36  Resp: 19 19  17   Temp:   100.2 F (37.9 C)   TempSrc:   Oral   SpO2: 100% 95%  100%  Weight:      Height:        Constitutional: NAD, alert and oriented x 3 Eyes: PERRL, lids and conjunctivae normal ENMT: Mucous membranes are moist.  Neck: normal, supple, no masses, no thyromegaly Respiratory: clear to auscultation bilaterally, no wheezing, no crackles. Normal respiratory effort. No accessory muscle use.  Cardiovascular: Irregularly irregular,no murmurs / rubs / gallops. No extremity edema. 2+ pedal pulses. No carotid bruits.  Abdomen: no tenderness, no masses palpated. No hepatosplenomegaly. Bowel sounds positive.  Musculoskeletal: no clubbing / cyanosis. No joint deformity upper and lower extremities.  Skin: no rashes, lesions, ulcers, redness over right foot, eschar over tip of right and lateral portion of right great toe.  Ulcer over plantar surface of the right foot Neurologic: No gross focal neurologic deficit. Psychiatric: Normal mood and affect.   Labs on Admission: I have personally reviewed following labs and imaging studies  CBC: Recent Labs  Lab 05/25/19 1739  WBC 6.1  NEUTROABS 4.9  HGB 14.2  HCT 42.1  MCV 101.7*  PLT 71*   Basic Metabolic Panel: Recent Labs  Lab 05/23/19 1338 05/25/19 1739  NA 128* 131*  K 4.0 4.0  CL 91* 94*  CO2 30 25  GLUCOSE 175* 173*  BUN 55* 46*  CREATININE 0.96 1.10*  CALCIUM 9.9 9.7   GFR: Estimated Creatinine Clearance: 48.5 mL/min (A) (by C-G formula based on SCr of 1.1 mg/dL (H)). Liver Function Tests: Recent Labs  Lab 05/23/19 1338 05/25/19 1739  AST 78* 163*  ALT 69* 135*  ALKPHOS 256* 218*  BILITOT 0.9 1.1  PROT 7.4 7.4  ALBUMIN 4.0 3.7   No results for input(s): LIPASE, AMYLASE in the last 168 hours. No results for input(s): AMMONIA in the last 168  hours. Coagulation Profile: No results for input(s): INR, PROTIME in the last 168 hours. Cardiac Enzymes: No results for input(s): CKTOTAL, CKMB, CKMBINDEX, TROPONINI in the last 168 hours. BNP (last 3 results) Recent Labs    09/26/18 1650 10/27/18 1416 05/23/19 1338  PROBNP 417* 621* 73.0   HbA1C: No results for input(s): HGBA1C in the last 72 hours. CBG: Recent Labs  Lab 05/25/19 2022 05/26/19 0401 05/26/19 1213 05/26/19 1624  GLUCAP 168* 179* 235* 161*   Lipid Profile: No results for input(s): CHOL, HDL, LDLCALC, TRIG, CHOLHDL, LDLDIRECT in the last 72 hours. Thyroid Function Tests: No results for input(s): TSH, T4TOTAL, FREET4, T3FREE, THYROIDAB in the last 72 hours. Anemia Panel: No results for input(s): VITAMINB12, FOLATE, FERRITIN, TIBC, IRON, RETICCTPCT in the last 72 hours. Urine analysis:    Component Value Date/Time   COLORURINE YELLOW 04/05/2016 [place] 04/05/2016 1203   LABSPEC 1.022 04/05/2016 1203   PHURINE 6.0 04/05/2016 1203   GLUCOSEU 250 (A) 04/05/2016 1203   HGBUR NEGATIVE 04/05/2016 [place] 04/05/2016 [place] 04/05/2016 1203   PROTEINUR NEGATIVE 04/05/2016 1203   NITRITE NEGATIVE 04/05/2016 1203   LEUKOCYTESUR TRACE (A) 04/05/2016 1203    Radiological Exams on Admission: DG Chest Port 1 View  Result Date: 05/25/2019 CLINICAL DATA:  Fever and chills EXAM: PORTABLE CHEST 1 VIEW COMPARISON:  November 08, 2018 FINDINGS: Again noted is mild cardiomegaly. A left-sided pacemaker seen with the lead tips in the right atrium right ventricle. There is prominence of the central pulmonary vasculature. No large airspace consolidation or pleural effusion. No acute osseous abnormality. IMPRESSION: Cardiomegaly and pulmonary vascular congestion. Electronically Signed   By: Prudencio Pair M.D.   On: 05/25/2019 19:24   DG Foot Complete Right  Result Date: 05/25/2019 CLINICAL DATA:  Infection. EXAM: RIGHT FOOT  COMPLETE - 3+ VIEW COMPARISON:  None. FINDINGS: The patient is status post prior amputation through the proximal aspect of the proximal phalanx of the second digit. There is no convincing radiographic evidence for osteomyelitis. There is no acute displaced fracture or dislocation. There may be some mild soft tissue swelling without evidence for associated subcutaneous gas. Vascular calcifications are noted. There is a small plantar calcaneal spur. There is an Achilles tendon enthesophyte. IMPRESSION: 1. No acute displaced fracture or dislocation. No radiographic evidence for osteomyelitis. 2. Postsurgical changes as above. Electronically Signed   By: Constance Holster M.D.   On: 05/25/2019 19:25    EKG: Independently reviewed.   Atrial fibrillation  Assessment/Plan Principal Problem:   Cellulitis in diabetic foot (HCC) Active Problems:   Peripheral vascular disease (HCC)   Permanent atrial fibrillation (HCC)   HTN (hypertension)   Hypothyroidism   Thrombocytopenia (HCC)   Type 2 diabetes mellitus with foot ulcer (HCC)   Depression   Cellulitis   Other cirrhosis of liver (HCC)   Chronic systolic CHF (congestive heart failure) (HCC)     Cellulitis and diabetic foot infection Patient presents for evaluation of redness involving the right foot with associated fever and differential warmth Start patient empirically on cefazolin 1 g IV every 8 Keep right foot elevated Podiatry consult   Peripheral vascular disease Continue statins, metoprolol and Plavix   Diabetes mellitus with complications of foot ulcer Maintain consistent carbohydrate diet Patient on sliding scale coverage  Hypothyroidism Continue Synthroid   History of atrial fibrillation Continue metoprolol for rate control Patient not on long-term anticoagulation due to increased risk for bleeding secondary to thrombocytopenia   History of liver cirrhosis with evidence of portal hypertension Patient has significant  thrombocytopenia Monitor closely for signs of bleeding Continue furosemide, spironolactone and metoprolol Maintain low-sodium diet    Depression Continue BuSpar and venlafaxine   Chronic systolic CHF Last known LVEF of 40% Continue metoprolol, furosemide, spironolactone and Cozaar Maintain low-sodium diet   DVT prophylaxis: No pharmacologic DVT prophylaxis due to thrombocytopenia and no mechanical DVT prophylaxis due to PAD Code Status: Full code Family Communication: Plan of care was discussed with patient at the bedside. She verbalizes understanding and agrees with the plan Disposition Plan: Back to previous home environment Consults called: Podiatry    Alysah Carton MD [facility]     05/26/2019, 4:39 PM

## 2019-05-27 ENCOUNTER — Encounter: Payer: Self-pay | Admitting: Medical

## 2019-05-27 ENCOUNTER — Encounter: Payer: Self-pay | Admitting: Podiatry

## 2019-05-27 DIAGNOSIS — L97329 Non-pressure chronic ulcer of left ankle with unspecified severity: Secondary | ICD-10-CM

## 2019-05-27 DIAGNOSIS — I96 Gangrene, not elsewhere classified: Secondary | ICD-10-CM

## 2019-05-27 DIAGNOSIS — L97411 Non-pressure chronic ulcer of right heel and midfoot limited to breakdown of skin: Secondary | ICD-10-CM

## 2019-05-27 LAB — BASIC METABOLIC PANEL
Anion gap: 10 (ref 5–15)
BUN: 30 mg/dL — ABNORMAL HIGH (ref 8–23)
CO2: 21 mmol/L — ABNORMAL LOW (ref 22–32)
Calcium: 8.9 mg/dL (ref 8.9–10.3)
Chloride: 105 mmol/L (ref 98–111)
Creatinine, Ser: 0.64 mg/dL (ref 0.44–1.00)
GFR calc Af Amer: >60 mL/min (ref >60)
GFR calc non Af Amer: >60 mL/min (ref >60)
Glucose, Bld: 168 mg/dL — ABNORMAL HIGH (ref 70–99)
Potassium: 3.5 mmol/L (ref 3.5–5.1)
Sodium: 136 mmol/L (ref 135–145)

## 2019-05-27 LAB — CBC
HCT: 38.1 % (ref 36.0–46.0)
Hemoglobin: 12.7 g/dL (ref 12.0–15.0)
MCH: 34.8 pg — ABNORMAL HIGH (ref 26.0–34.0)
MCHC: 33.3 g/dL (ref 30.0–36.0)
MCV: 104.4 fL — ABNORMAL HIGH (ref 80.0–100.0)
Platelets: 62 10*3/uL — ABNORMAL LOW (ref 150–400)
RBC: 3.65 MIL/uL — ABNORMAL LOW (ref 3.87–5.11)
RDW: 16.1 % — ABNORMAL HIGH (ref 11.5–15.5)
WBC: 5 10*3/uL (ref 4.0–10.5)
nRBC: 0.4 % — ABNORMAL HIGH (ref 0.0–0.2)

## 2019-05-27 LAB — GLUCOSE, CAPILLARY
Glucose-Capillary: 158 mg/dL — ABNORMAL HIGH (ref 70–99)
Glucose-Capillary: 237 mg/dL — ABNORMAL HIGH (ref 70–99)
Glucose-Capillary: 168 mg/dL — ABNORMAL HIGH (ref 70–99)
Glucose-Capillary: 135 mg/dL — ABNORMAL HIGH (ref 70–99)

## 2019-05-27 NOTE — Consult Note (Signed)
[facility] Nurse Consult Note: Reason for Consult: [facility] nurse consult placed simultaneously to consult to Podiatry. Their orders and POC development supercedes that of a [place].  Podiatry (Dr. Posey Pronto) has seen this morning and assessed and photographed wound as well as provided guidance for three times daily wound care with betadine dressings.  Off-loading is recommended. Patient to be followed in the outpatient setting by Dr. Jacqualyn Posey.  Additionally, HHRN is recommended by provider. Please see Dr. Serita Grit noted dated today at 9:44am.  If you agree with Dr. Serita Grit recommendations, please order.  Wound type: arterial insufficiency, infectious Pressure Injury POA: N/A   WOC nursing team will not follow, but will remain available to this patient, the nursing and medical teams.  Please re-consult if needed. Thanks, Maudie Flakes, MSN, RN, [place], Arther Abbott  Pager# [phone]

## 2019-05-27 NOTE — Progress Notes (Addendum)
PROGRESS NOTE    Alexis [name]  XTG:[id number] DOB: Mar 02, 1950 DOA: 05/25/2019 PCP: Mackie Pai, PA-C  Brief Narrative: 69 y.o. female with medical history significant for insulin-dependent diabetes mellitus, hypertension, atrial fibrillation, coronary artery disease and peripheral arterial disease status post amputation of the right second toe who was transferred from the ER at [facility] for treatment of cellulitis involving the right foot and diabetic foot infection involving the right great toe.  Patient has been seen by podiatry and is status post debridement of the right great toe.  She states that she was supposed to start antibiotic, doxycycline but that it was not called in.  She developed a fever and redness involving the right foot presented to the emergency room.  Her T-max was 99.40F in the ER but per patient it was 100.8F at home.  Denies having any cough, no chest pain, no shortness of breath, no dizziness or lightheadedness.  Denies having any abdominal pain, no nausea, no vomiting or diarrhea.  Has no urinary symptoms She had an x-ray of the right foot which showed no acute displaced fracture or dislocation. No radiographic evidence for osteomyelitis  ED Course: Patient presented to the emergency room for evaluation of a fever. Patient's vital signs not meeting strict sepsis criteria. But a little bit of concern for perhaps a preseptic picture. With her heart rate up some. Never hypotensive. Initially had 2 blood pressures when out in triage that were low. But since she was been back in the room  her initial one was 270 systolic. And they have remained in that ballpark. Heart rates been in the low 100s. Covid testing is pending. Patient started on broad-spectrum antibiotics initially Zosyn for diabetic cellulitis. And vancomycin was added. Patient without any chest pain. But initial troponin was 31. Repeat 2-hour troponin was 33 so no significant delta change. Patient  without a leukocytosis. Labs as far as electrolytes unchanged from baseline. Chest x-ray was negative. Patient EKG had a little bit of subtle ST changes but nothing acute. And again patient's had no chest pain I think with 2 - troponins unlikely that there is an acute cardiac component.  Patient will be admitted to telemetry. As this could be early sepsis. In addition x-ray of the right foot showed no bony problems or any evidence of gas in the soft tissue.  Assessment & Plan:   Principal Problem:   Cellulitis in diabetic foot ([place]) Active Problems:   Peripheral vascular disease (HCC)   Permanent atrial fibrillation (HCC)   HTN (hypertension)   Hypothyroidism   Thrombocytopenia (HCC)   Type 2 diabetes mellitus with foot ulcer ([place])   Depression   Cellulitis   Other cirrhosis of liver (HCC)   Chronic systolic CHF (congestive heart failure) ([place])   #1 right lower extremity diabetic foot infection with cellulitis-patient admitted with fever and chills and right lower extremity swelling and warmth. Patient followed by Dr. Jacqualyn Posey who has been consulted from the ED. Continue cefazolin for now. MRSA PCR negative  #2 type 2 diabetes uncontrolled with right great toe ulcer CBG (last 3) -on ssi and lantus 10 units Recent Labs    05/26/19 1624 05/26/19 2137 05/27/19 0804  GLUCAP 161* 156* 158*     #3 PVD on statin and Plavix continue  #4 history of chronic atrial fibrillation rate controlled on metoprolol Not on any anticoagulation due to thrombocytopenia  #5 history of Karlene Lineman with cirrhosis and evidence of portal hypertension and thrombocytopenia with elevated LFTs- on Lasix  spironolactone at home continue  #6 depression continue BuSpar and Effexor  #7 history of systolic congestive heart failure with ejection fraction 40% on Lasix spironolactone and Cozaar continue continue metoprolol  #8 multiple lower abdominal ulcerations chronic present on admission over 1 year  followed up at wound care clinic Will consult wound care in a.m.   Estimated body mass index is 34.75 kg/m as calculated from the following:   Height as of this encounter: 5' 2"  (1.575 m).   Weight as of this encounter: 86.2 kg.  DVT prophylaxis: None due to thrombocytopenia and PAD with foot ulcer Code Status: Full code  family Communication: Discussed with patient Disposition Plan: Patient came from home Plan is to return home Barrier to discharge is diabetic foot ulcer with cellulitis  Consultants:   Podiatry  Procedures: None Antimicrobials: Cefazolin  Subjective: Patient resting in bed she has no pain or sensation to her lower extremities with severe neuropathy She also has chronic lower abdominal open areas/ulcers for over 1 year for which she follows up with wound care center.  Objective: Vitals:   05/26/19 2134 05/27/19 0309 05/27/19 0623 05/27/19 0808  BP: (!) 120/59 (!) 122/53 (!) 137/96 137/61  Pulse: (!) 55 70 61 (!) 57  Resp: 20 20 20  (!) 22  Temp: 98 F (36.7 C) 98.6 F (37 C) 97.7 F (36.5 C) 97.8 F (36.6 C)  TempSrc: Oral Oral Oral Oral  SpO2: 99% 92% 98% 97%  Weight:      Height:       No intake or output data in the 24 hours ending 05/27/19 0907 Filed Weights   05/25/19 1630  Weight: 86.2 kg    Examination:  General exam: Appears calm and comfortable  Respiratory system: Clear to auscultation. Respiratory effort normal. Cardiovascular system: S1 & S2 heard, RRR. No JVD, murmurs, rubs, gallops or clicks. No pedal edema. Gastrointestinal system: Abdomen is nondistended, soft and nontender. No organomegaly or masses felt. Normal bowel sounds heard. Central nervous system: Alert and oriented. No focal neurological deficits. Extremities: Right great toe covered with dressing, erythema to the right lower extremity noted  skin: Multiple lower abdominal open areas covered with dressings that needs to be changed every 3 days  psychiatry: Judgement  and insight appear normal. Mood & affect appropriate.     Data Reviewed: I have personally reviewed following labs and imaging studies  CBC: Recent Labs  Lab 05/25/19 1739 05/27/19 0604  WBC 6.1 5.0  NEUTROABS 4.9  --   HGB 14.2 12.7  HCT 42.1 38.1  MCV 101.7* 104.4*  PLT 71* 62*   Basic Metabolic Panel: Recent Labs  Lab 05/23/19 1338 05/25/19 1739 05/27/19 0604  NA 128* 131* 136  K 4.0 4.0 3.5  CL 91* 94* 105  CO2 30 25 21*  GLUCOSE 175* 173* 168*  BUN 55* 46* 30*  CREATININE 0.96 1.10* 0.64  CALCIUM 9.9 9.7 8.9   GFR: Estimated Creatinine Clearance: 66.6 mL/min (by C-G formula based on SCr of 0.64 mg/dL). Liver Function Tests: Recent Labs  Lab 05/23/19 1338 05/25/19 1739  AST 78* 163*  ALT 69* 135*  ALKPHOS 256* 218*  BILITOT 0.9 1.1  PROT 7.4 7.4  ALBUMIN 4.0 3.7   No results for input(s): LIPASE, AMYLASE in the last 168 hours. No results for input(s): AMMONIA in the last 168 hours. Coagulation Profile: No results for input(s): INR, PROTIME in the last 168 hours. Cardiac Enzymes: No results for input(s): CKTOTAL, CKMB, CKMBINDEX, TROPONINI in the  last 168 hours. BNP (last 3 results) Recent Labs    09/26/18 1650 10/27/18 1416 05/23/19 1338  PROBNP 417* 621* 73.0   HbA1C: Recent Labs    05/26/19 1649  HGBA1C 7.7*   CBG: Recent Labs  Lab 05/26/19 0401 05/26/19 1213 05/26/19 1624 05/26/19 2137 05/27/19 0804  GLUCAP 179* 235* 161* 156* 158*   Lipid Profile: No results for input(s): CHOL, HDL, LDLCALC, TRIG, CHOLHDL, LDLDIRECT in the last 72 hours. Thyroid Function Tests: No results for input(s): TSH, T4TOTAL, FREET4, T3FREE, THYROIDAB in the last 72 hours. Anemia Panel: No results for input(s): VITAMINB12, FOLATE, FERRITIN, TIBC, IRON, RETICCTPCT in the last 72 hours. Sepsis Labs: Recent Labs  Lab 05/25/19 1739  LATICACIDVEN 1.6    Recent Results (from the past 240 hour(s))  Culture, blood (Routine X 2) w Reflex to ID Panel      Status: None (Preliminary result)   Collection Time: 05/25/19  5:39 PM   Specimen: Right Antecubital; Blood  Result Value Ref Range Status   Specimen Description   Final    RIGHT ANTECUBITAL Performed at [facility], [address] [place], [place] [address]    Special Requests   Final    BOTTLES DRAWN AEROBIC AND ANAEROBIC Blood Culture results may not be optimal due to an inadequate volume of blood received in culture bottles Performed at [facility], [address] [place], [place] [address]    Culture   Final    NO GROWTH 2 DAYS Performed at [facility], [place] [address] [place], [place] [address]    Report Status PENDING  Incomplete  SARS CORONAVIRUS 2 (TAT 6-24 HRS) Nasopharyngeal Nasopharyngeal Swab     Status: None   Collection Time: 05/25/19  5:39 PM   Specimen: Nasopharyngeal Swab  Result Value Ref Range Status   SARS Coronavirus 2 NEGATIVE NEGATIVE Final    Comment: (NOTE) SARS-CoV-2 target nucleic acids are NOT DETECTED. The SARS-CoV-2 RNA is generally detectable in upper and lower respiratory specimens during the acute phase of infection. Negative results do not preclude SARS-CoV-2 infection, do not rule out co-infections with other pathogens, and should not be used as the sole basis for treatment or other patient management decisions. Negative results must be combined with clinical observations, patient history, and epidemiological information. The expected result is Negative. Fact Sheet for Patients: SugarRoll.be Fact Sheet for Healthcare Providers: https://www.[employer].com/ This test is not yet approved or cleared by the [place] FDA and  has been authorized for detection and/or diagnosis of SARS-CoV-2 by FDA under an Emergency Use Authorization (EUA). This EUA will remain  in effect (meaning this test can be used) for the duration of the COVID-19 declaration under Section 56  4(b)(1) of the Act, 21 U.S.C. section 360bbb-3(b)(1), unless the authorization is terminated or revoked sooner. Performed at [facility], [place] [address], [place], [place] [address]   Culture, blood (Routine X 2) w Reflex to ID Panel     Status: None (Preliminary result)   Collection Time: 05/25/19  5:40 PM   Specimen: Left Antecubital; Blood  Result Value Ref Range Status   Specimen Description   Final    LEFT ANTECUBITAL Performed at [facility], [address] [place], [place] [address]    Special Requests   Final    BOTTLES DRAWN AEROBIC AND ANAEROBIC Blood Culture results may not be optimal due to an inadequate volume of blood received in culture  bottles Performed at [facility], [address] [place], [place] [address]    Culture   Final    NO GROWTH 2 DAYS Performed at [facility], [place] [address], [place], [place] [address]    Report Status PENDING  Incomplete  MRSA PCR Screening     Status: None   Collection Time: 05/26/19  2:54 PM   Specimen: Nasopharyngeal  Result Value Ref Range Status   MRSA by PCR NEGATIVE NEGATIVE Final    Comment:        The GeneXpert MRSA Assay (FDA approved for NASAL specimens only), is one component of a comprehensive MRSA colonization surveillance program. It is not intended to diagnose MRSA infection nor to guide or monitor treatment for MRSA infections. Performed at [facility], [place] [address] [place], [place] [address]          Radiology Studies: [facility] Chest Port 1 View  Result Date: 05/25/2019 CLINICAL DATA:  Fever and chills EXAM: PORTABLE CHEST 1 VIEW COMPARISON:  November 08, 2018 FINDINGS: Again noted is mild cardiomegaly. A left-sided pacemaker seen with the lead tips in the right atrium right ventricle. There is prominence of the central pulmonary vasculature. No large airspace consolidation or pleural effusion. No acute osseous abnormality. IMPRESSION: Cardiomegaly  and pulmonary vascular congestion. Electronically Signed   By: Prudencio Pair M.D.   On: 05/25/2019 19:24   DG Foot Complete Right  Result Date: 05/25/2019 CLINICAL DATA:  Infection. EXAM: RIGHT FOOT COMPLETE - 3+ VIEW COMPARISON:  None. FINDINGS: The patient is status post prior amputation through the proximal aspect of the proximal phalanx of the second digit. There is no convincing radiographic evidence for osteomyelitis. There is no acute displaced fracture or dislocation. There may be some mild soft tissue swelling without evidence for associated subcutaneous gas. Vascular calcifications are noted. There is a small plantar calcaneal spur. There is an Achilles tendon enthesophyte. IMPRESSION: 1. No acute displaced fracture or dislocation. No radiographic evidence for osteomyelitis. 2. Postsurgical changes as above. Electronically Signed   By: Constance Holster M.D.   On: 05/25/2019 19:25        Scheduled Meds: . acidophilus  2 capsule Oral Daily  . allopurinol  100 mg Oral BID  . atorvastatin  10 mg Oral q1800  . busPIRone  7.5 mg Oral BID  . Chlorhexidine Gluconate Cloth  6 each Topical Daily  . clopidogrel  75 mg Oral Daily  . fluticasone  2 spray Each Nare QHS  . furosemide  20 mg Oral Q1200  . furosemide  40 mg Oral Daily  . insulin aspart  0-15 Units Subcutaneous TID WC  . insulin aspart  4 Units Subcutaneous TID WC  . insulin glargine  10 Units Subcutaneous Daily  . levothyroxine  150 mcg Oral QAC breakfast  . loratadine  10 mg Oral QHS  . metoprolol succinate  25 mg Oral Daily  . multivitamins with iron  1 tablet Oral Daily  . pantoprazole  40 mg Oral Daily  . psyllium  1 packet Oral Daily  . spironolactone  50 mg Oral Daily   Continuous Infusions: . sodium chloride 75 mL/hr at 05/27/19 0009  .  ceFAZolin (ANCEF) IV 1 g (05/27/19 0627)  . vancomycin 750 mg (05/27/19 0211)     LOS: 2 days     Georgette Shell, MD 05/27/2019, 9:07 AM

## 2019-05-27 NOTE — Progress Notes (Signed)
Subjective: 69 year old female presents the office today for "evaluation of gangrene to the right hallux as well as right heel wound.  Since I last saw her she underwent arthrectomy and does not require bypass.  Since the procedure she has noticed swelling to her right foot.  She denies any drainage or pus coming from the area.  She did send a message over the weekend she had some bloody drainage but she has been keeping clean and dry.  Having some pain to this left foot not requiring pain medicine. Denies any systemic complaints such as fevers, chills, nausea, vomiting. No acute changes since last appointment, and no other complaints at this time.   Angio 05/14/2019 SFA had 16 cm occlusive lesion extending down into the at the knee popliteal artery.  After laser atherectomy we had a channel with one area of dissection this was stented.  We ballooned the stent as well as the SFA with 4 mm balloon.  She then had brisk flow to the foot via the peroneal.  The anterior tibial artery was occluded after the takeoff did reconstitute.  We did not demonstrate flow via the posterior tibial artery has had a wire and sheath in place completion but I suspect there is good flow there as well.  Where previously there was a 16 cm lesion there is now 0% residual stenosis.  Objective: AAO x3, NAD DP, PT pulses decreased.  There is increased swelling to the right foot there is no warmth.  Gangrenous changes present with distal aspect of right hallux as well as medial hallux.  There is some dry skin along the periphery of the eschars.  Healed wound present on the medial aspect of the heel measuring approximate 0.3 x 0.3 cm no superficial or any probing, undermining or tunneling.  There is no fluctuation crepitation.  No significant malodor. No open lesions or pre-ulcerative lesions.  No pain with calf compression, swelling, warmth, erythema                Assessment: Gangrene right hallux, heel  wound  Plan: -All treatment options discussed with the patient including all alternatives, risks, complications.  -I do think this was more as a result of increased circulation but as a precaution will start doxycycline.  I want her to keep the toe wound as well as the heel clean and dry.  She contacted a small amount of Betadine on to the hallux.  Small amount of antibiotic ointment to the heel wound. -Continue offloading shoe -Elevation -Monitor for any clinical signs or symptoms of infection and directed to call the office immediately should any occur or go to the ER. -Patient encouraged to call the office with any questions, concerns, change in symptoms.   Trula Slade DPM

## 2019-05-27 NOTE — Progress Notes (Signed)
Received a message from Dr. Boneta Lucks who is on call that Alexis [name] has been admitted to the hospital. He will be by to see her Sunday morning. I will also follow.   I did call the patient to see how she was doing. She did not start the antibiotics until Wednesday. She has come to the office on Monday and due to swelling I ordered doxycyline in case of infection although she had no systemic signs at the time. The antibiotics did not get sent until until Tuesday. She didn't pick them up until Wednesday. We attempted to help get a nurse to the house to change the bandage but she states nobody came to do this.

## 2019-05-27 NOTE — Consult Note (Signed)
Subjective:  Patient ID: Alexis [name], female    DOB: [birth year],  MRN: [id number] HPI:  A 69 y.o. female with medical history significant forinsulin-dependent diabetes mellitus,hypertension,atrial fibrillation,coronary artery disease and peripheral arterial disease status post amputation of the right second toe who was transferred from the ER at [facility] for treatment of cellulitis involvingthe right foot and diabetic foot infection involving the right great toe. Patient states that she has subjective fever of 101.9 at home with associated chills.  She is well-known to Dr. Earleen Newport.  She also had a previous vascular procedure done. Angio 05/14/2019 SFA had 16 cm occlusive lesion extending down into the at the knee popliteal artery. After laser atherectomy we had a channel with one area of dissection this was stented. We ballooned the stent as well as the SFA with 4 mm balloon. She then had brisk flow to the foot via the peroneal. The anterior tibial artery was occluded after the takeoff did reconstitute. We did not demonstrate flow via the posterior tibial artery has had a wire and sheath in place completion but I suspect there is good flow there as well. Where previously there was a 16 cm lesion there is now 0% residual stenosis.  Patient was seen at bedside by me today.  Overall patient is in good spirit.  She denies any acute complaints.  She states overall the toe is looking much better.  She just wanted to make sure that she is not going to have to undergo an amputation.  She also has an ulceration to the right heel very small and superficial in nature.  She states that that is looking about the same as well.  She has been applying some triple antibiotic to the heel wound.  She denies any other acute complaints.  Objective:   Vitals:   05/27/19 0623 05/27/19 0808  BP: (!) 137/96 137/61  Pulse: 61 (!) 57  Resp: 20 (!) 22  Temp: 97.7 F (36.5 C) 97.8 F (36.6 C)  SpO2: 98%  97%   General AA&O x3. Normal mood and affect.  Vascular Dorsalis pedis and posterior tibial pulses 2/4 bilat. Brisk capillary refill to all digits. Pedal hair present.  Neurologic Epicritic sensation grossly intact.  Dermatologic  right hallux gangrene with a superficial necrotic.  No clinical signs of infection noted.  Cellulitis has resolved.  No exposure of bone.  No purulent drainage was expressed.  Dry stable in nature. Right heel ulceration measuring 0.2 cm x 0.2 cm x 0.1 cm.  Fibrogranular in nature no periwound cellulitis/purulent drainage noted.  No malodor or fluctuance noted.  Orthopedic: MMT 5/5 in dorsiflexion, plantarflexion, inversion, and eversion. Normal joint ROM without pain or crepitus.        Assessment & Plan:  Patient was evaluated and treated and all questions answered.  Right hallux gangrene improving and right heel ulceration improving  -All treatment options discussed with the patient including alternatives risk and all the complications associated with it. -Patient cellulitis has completely resolved.  There is improvement associated with right heel ulceration as well as right hallux gangrenous changes. -Patient will benefit from offloading the right heel as well with pillows or an offloading boot. -Given the patient has had increase of circulation, her gangrenous changes to the digits is improving and I am hopeful that over time the distal necrotic calf will fall off with healthy underlying granular tissue.  However as long as there is an open wound there is always a risk of loss of  digit versus foot.  I explained this to the patient.  Patient states understanding. -She will need to set up home nursing prior to discharge.  She will benefit from Betadine wet-to-dry changes 3 times a week with home nursing. -Continue offloading with flat offloading shoes.  She can be weightbearing as tolerated with a offloading shoes. -She will benefit from 48 to 72 hours of IV  antibiotics followed by discharge on p.o. doxycycline for 14-day course. -Patient will be seen by Dr. Jacqualyn Posey in on an outpatient setting.  She will be scheduled for an appointment in an outpatient setting.   Felipa Furnace, DPM  Accessible via secure chat for questions or concerns.

## 2019-05-28 ENCOUNTER — Other Ambulatory Visit: Payer: Self-pay

## 2019-05-28 DIAGNOSIS — I96 Gangrene, not elsewhere classified: Secondary | ICD-10-CM

## 2019-05-28 DIAGNOSIS — M109 Gout, unspecified: Secondary | ICD-10-CM

## 2019-05-28 DIAGNOSIS — M1990 Unspecified osteoarthritis, unspecified site: Secondary | ICD-10-CM

## 2019-05-28 LAB — GLUCOSE, CAPILLARY
Glucose-Capillary: 149 mg/dL — ABNORMAL HIGH (ref 70–99)
Glucose-Capillary: 144 mg/dL — ABNORMAL HIGH (ref 70–99)
Glucose-Capillary: 170 mg/dL — ABNORMAL HIGH (ref 70–99)
Glucose-Capillary: 133 mg/dL — ABNORMAL HIGH (ref 70–99)
Glucose-Capillary: 159 mg/dL — ABNORMAL HIGH (ref 70–99)

## 2019-05-28 MED ORDER — INSULIN GLARGINE 100 UNIT/ML ~~LOC~~ SOLN
15.0000 [IU] | Freq: Every day | SUBCUTANEOUS | Status: DC
  Administered 2019-05-29: 11:00:00 15 [IU] via SUBCUTANEOUS
  Filled 2019-05-28: qty 0.15

## 2019-05-28 NOTE — Progress Notes (Signed)
Nutrition Brief Note  Patient identified on the Malnutrition Screening Tool (MST) Report  Wt Readings from Last 15 Encounters:  05/25/19 86.2 kg  05/14/19 84.8 kg  05/04/19 85.3 kg  05/03/19 85.3 kg  05/01/19 86.6 kg  04/30/19 88.5 kg  04/25/19 87.5 kg  04/19/19 86.6 kg  04/12/19 89.4 kg  01/15/19 98.9 kg  01/08/19 97.5 kg  01/08/19 97.5 kg  01/05/19 100.9 kg  12/25/18 98.4 kg  11/17/18 108.9 kg    Body mass index is 34.75 kg/m. Patient meets criteria for obesity based on current BMI. Cellulitis to R foot; R foot DM ulcer. She reports being unsure about any recent weight changes. She denies any changes in appetite and states she has a good appetite at baseline.   Current diet order is Carb Modified and flow sheet documentation indicates 75% completion of lunch on 3/28 and 75% of breakfast today.  Labs and medications reviewed.   No nutrition interventions warranted at this time. If nutrition issues arise, please consult RD.     Jarome Matin, MS, RD, LDN, CNSC Inpatient Clinical Dietitian RD pager # available in [place]  After hours/weekend pager # available in [facility]

## 2019-05-28 NOTE — Progress Notes (Signed)
PROGRESS NOTE    Alexis [name]  FHL:[id number] DOB: [birth year] DOA: 05/25/2019 PCP: Mackie Pai, PA-C  Brief Narrative: 69 y.o.femalewith medical history significant forinsulin-dependent diabetes mellitus,hypertension,atrial fibrillation,coronary artery disease and peripheral arterial disease status post amputation of the right second toe who was transferred from the ER at [facility] for treatment of cellulitis involvingthe right foot and diabetic foot infection involving the right great toe.Patient has been seen by podiatry and is status post debridement of the right great toe.She states that she was supposed to start antibiotic,doxycycline but that it was not called in.She developed a fever and redness involving the right foot presented to the emergency room.Her T-max was 99.40F in the ER but per patient it was 100.69F at home.Denies having any cough,no chest pain,no shortness of breath,no dizziness or lightheadedness.Denies having any abdominal pain,no nausea,no vomiting or diarrhea.Has no urinary symptoms She had an x-ray of the right foot which showedno acute displaced fracture or dislocation. No radiographic evidence for osteomyelitis  ED Course:Patient presented to the emergency room for evaluation of a fever.Patient's vital signs not meeting strict sepsis criteria. But a little bit of concern for perhaps a preseptic picture. With her heart rate up some. Never hypotensive. Initially had 2 blood pressures when out in triage that were low. But since she was been back in the room her initial one was 373 systolic. And they have remained in that ballpark. Heart rates been in the low 100s. Covid testing is pending. Patient started on broad-spectrum antibiotics initially Zosyn for diabetic cellulitis. And vancomycin was added. Patient without any chest pain. But initial troponin was 31. Repeat 2-hour troponin was 33 so no significant delta change. Patient  without a leukocytosis. Labs as far as electrolytes unchanged from baseline. Chest x-ray was negative. Patient EKG had a little bit of subtle ST changes but nothing acute. And again patient's had no chest pain I think with 2 - troponins unlikely that there is an acute cardiac component.  Patient will beadmittedto telemetry. As this could be early sepsis. In addition x-ray of the right foot showed no bony problems or any evidence of gas in the soft tissue.  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 Appreciate podiatry consult.  Recommendations from podiatry include  --She will need to set up home nursing prior to discharge.  She will benefit from Betadine wet-to-dry changes 3 times a week with home nursing. -Continue offloading with flat offloading shoes.  She can be weightbearing as tolerated with a offloading shoes. -She will benefit from 48 to 72 hours of IV antibiotics followed by discharge on p.o. doxycycline for 14-day course. Continue cefazolin for another day or 2. MRSA PCR negative.  #2 type 2 diabetes uncontrolled with right great toe ulcer CBG (last 3)  Recent Labs    05/28/19 0127 05/28/19 0737 05/28/19 1142  GLUCAP 149* 144* 170*   Increase Lantus to 15 units nightly continue NovoLog 4 units 3 times a day.  #  3 PVD on statin and Plavix continue  #4 history of chronic atrial fibrillation rate controlled on  metoprolol Not on any anticoagulation due to thrombocytopenia  #5 history of Karlene Lineman with cirrhosis and evidence of portal hypertension and thrombocytopenia with elevated LFTs- on Lasix spironolactone at home continue  #6 depression continue BuSpar and Effexor  #7 history of systolic congestive heart failure with ejection fraction 40% on Lasix spironolactone and Cozaar continue continue metoprolol  #8 multiple lower abdominal ulcerations chronic present on admission over 1 year followed up at wound care clinic Will consult wound care in a.m.    Estimated body mass index is 34.75 kg/m as calculated from the following:   Height as of this encounter: 5' 2"  (1.575 m).   Weight as of this encounter: 86.2 kg.  DVT prophylaxis: None due to thrombocytopenia and PAD with foot ulcer Code Status: Full code  family Communication: Discussed with patient Disposition Plan: Patient came from home Plan is to return home with home health Barrier to discharge is diabetic foot ulcer with cellulitis  Consultants:   Podiatry  Procedures: None Antimicrobials: Cefazolin  Subjective: Patient is resting in bed reports she has not slept at all last night no nausea vomiting complains of chills and rigors.  Objective: Vitals:   05/27/19 0808 05/27/19 1030 05/27/19 2200 05/28/19 0536  BP: 137/61 126/69 135/72 137/84  Pulse: (!) 57 82  (!) 53  Resp: (!) 22 19 20 20   Temp: 97.8 F (36.6 C) 97.6 F (36.4 C) 98.4 F (36.9 C) 97.6 F (36.4 C)  TempSrc: Oral Oral  Oral  SpO2: 97% 100% 97% 99%  Weight:      Height:        Intake/Output Summary (Last 24 hours) at 05/28/2019 1145 Last data filed at 05/28/2019 0859 Gross per 24 hour  Intake 472 ml  Output -  Net 472 ml   Filed Weights   05/25/19 1630  Weight: 86.2 kg    Examination:  General exam: Appears calm and comfortable  Respiratory system: Clear to auscultation. Respiratory effort normal. Cardiovascular system: S1 & S2 heard,  RRR. No JVD, murmurs, rubs, gallops or clicks. No pedal edema. Gastrointestinal system: Abdomen is nondistended, soft and nontender. No organomegaly or masses felt. Normal bowel sounds heard. Central nervous system: Alert and oriented. No focal neurological deficits. Extremities: See pictures of right great toe with gangrene  skin: No rashes, lesions or ulcers Psychiatry: Judgement and insight appear normal. Mood & affect appropriate.     Data Reviewed: I have personally reviewed following labs and imaging studies  CBC: Recent Labs  Lab 05/25/19 1739 05/27/19 0604  WBC 6.1 5.0  NEUTROABS 4.9  --   HGB 14.2 12.7  HCT 42.1 38.1  MCV 101.7* 104.4*  PLT 71* 62*   Basic Metabolic Panel: Recent Labs  Lab 05/23/19 1338 05/25/19 1739 05/27/19 0604  NA 128* 131* 136  K 4.0 4.0 3.5  CL 91* 94* 105  CO2 30 25 21*  GLUCOSE 175* 173* 168*  BUN 55* 46* 30*  CREATININE 0.96 1.10* 0.64  CALCIUM 9.9 9.7 8.9   GFR: Estimated Creatinine Clearance: 66.6 mL/min (by C-G formula based on SCr of 0.64 mg/dL). Liver Function Tests: Recent Labs  Lab 05/23/19 1338 05/25/19 1739  AST 78* 163*  ALT 69* 135*  ALKPHOS 256* 218*  BILITOT 0.9 1.1  PROT 7.4 7.4  ALBUMIN 4.0 3.7   No results for input(s): LIPASE, AMYLASE in the last 168 hours. No results for  input(s): AMMONIA in the last 168 hours. Coagulation Profile: No results for input(s): INR, PROTIME in the last 168 hours. Cardiac Enzymes: No results for input(s): CKTOTAL, CKMB, CKMBINDEX, TROPONINI in the last 168 hours. BNP (last 3 results) Recent Labs    09/26/18 1650 10/27/18 1416 05/23/19 1338  PROBNP 417* 621* 73.0   HbA1C: Recent Labs    05/26/19 1649  HGBA1C 7.7*   CBG: Recent Labs  Lab 05/27/19 1631 05/27/19 2141 05/28/19 0127 05/28/19 0737 05/28/19 1142  GLUCAP 168* 135* 149* 144* 170*   Lipid Profile: No results for input(s): CHOL, HDL, LDLCALC, TRIG, CHOLHDL, LDLDIRECT in the last 72 hours. Thyroid  Function Tests: No results for input(s): TSH, T4TOTAL, FREET4, T3FREE, THYROIDAB in the last 72 hours. Anemia Panel: No results for input(s): VITAMINB12, FOLATE, FERRITIN, TIBC, IRON, RETICCTPCT in the last 72 hours. Sepsis Labs: Recent Labs  Lab 05/25/19 1739  LATICACIDVEN 1.6    Recent Results (from the past 240 hour(s))  Culture, blood (Routine X 2) w Reflex to ID Panel     Status: None (Preliminary result)   Collection Time: 05/25/19  5:39 PM   Specimen: Right Antecubital; Blood  Result Value Ref Range Status   Specimen Description RIGHT ANTECUBITAL  Final   Special Requests   Final    BOTTLES DRAWN AEROBIC AND ANAEROBIC Blood Culture results may not be optimal due to an inadequate volume of blood received in culture bottles Performed at [facility], [address] [place], [place] [address]    Culture NO GROWTH 3 DAYS  Final   Report Status PENDING  Incomplete  SARS CORONAVIRUS 2 (TAT 6-24 HRS) Nasopharyngeal Nasopharyngeal Swab     Status: None   Collection Time: 05/25/19  5:39 PM   Specimen: Nasopharyngeal Swab  Result Value Ref Range Status   SARS Coronavirus 2 NEGATIVE NEGATIVE Final    Comment: (NOTE) SARS-CoV-2 target nucleic acids are NOT DETECTED. The SARS-CoV-2 RNA is generally detectable in upper and lower respiratory specimens during the acute phase of infection. Negative results do not preclude SARS-CoV-2 infection, do not rule out co-infections with other pathogens, and should not be used as the sole basis for treatment or other patient management decisions. Negative results must be combined with clinical observations, patient history, and epidemiological information. The expected result is Negative. Fact Sheet for Patients: SugarRoll.be Fact Sheet for Healthcare Providers: https://www.woods- [place].com/ This test is not yet approved or cleared by the [place] FDA and  has been authorized for  detection and/or diagnosis of SARS-CoV-2 by FDA under an Emergency Use Authorization (EUA). This EUA will remain  in effect (meaning this test can be used) for the duration of the COVID-19 declaration under Section 56 4(b)(1) of the Act, 21 U.S.C. section 360bbb-3(b)(1), unless the authorization is terminated or revoked sooner. Performed at [facility], [place] [address] [place], [place] [address]   Culture, blood (Routine X 2) w Reflex to ID Panel     Status: None (Preliminary result)   Collection Time: 05/25/19  5:40 PM   Specimen: Left Antecubital; Blood  Result Value Ref Range Status   Specimen Description LEFT ANTECUBITAL  Final   Special Requests   Final    BOTTLES DRAWN AEROBIC AND ANAEROBIC Blood Culture results may not be optimal due to an inadequate volume of blood received in culture bottles Performed at [facility], [address] [place], [place] [address]    Culture NO GROWTH 3 DAYS  Final   Report Status PENDING  Incomplete  MRSA PCR Screening     Status: None   Collection Time: 05/26/19  2:54 PM   Specimen: Nasopharyngeal  Result Value Ref Range Status   MRSA by PCR NEGATIVE NEGATIVE Final    Comment:        The GeneXpert MRSA Assay (FDA approved for NASAL specimens only), is one component of a comprehensive MRSA colonization surveillance program. It is not intended to diagnose MRSA infection nor to guide or monitor treatment for MRSA infections. Performed at [facility], [place] [address], [place], [place] [address]          Radiology Studies: No results found.      Scheduled Meds: . acidophilus  2 capsule Oral Daily  . allopurinol  100 mg Oral BID  . atorvastatin  10 mg Oral q1800  . busPIRone  7.5 mg Oral BID  . Chlorhexidine Gluconate Cloth  6 each Topical Daily  . clopidogrel  75 mg Oral Daily  . fluticasone  2 spray Each Nare QHS  . furosemide  20 mg Oral Q1200  . furosemide  40 mg Oral Daily  .  insulin aspart  0-15 Units Subcutaneous TID WC  . insulin aspart  4 Units Subcutaneous TID WC  . insulin glargine  10 Units Subcutaneous Daily  . levothyroxine  150 mcg Oral QAC breakfast  . loratadine  10 mg Oral QHS  . metoprolol succinate  25 mg Oral Daily  . multivitamins with iron  1 tablet Oral Daily  . pantoprazole  40 mg Oral Daily  . psyllium  1 packet Oral Daily  . spironolactone  50 mg Oral Daily   Continuous Infusions: .  ceFAZolin (ANCEF) IV 1 g (05/28/19 0532)     LOS: 3 days     Georgette Shell, MD  05/28/2019, 11:45 AM

## 2019-05-28 NOTE — Progress Notes (Signed)
Subjective: 69 year old female was admitted to the hospital over the weekend for cellulitis, infection to her right foot.  She started doxycycline on Wednesday.  She states that on Friday she started noticing increased swelling and redness to her feet as well as fevers.  Today upon discussion with her she states that she has been on her feet quite a bit since I last saw her and after her vascular procedure.  She has done a lot of shopping and walking. She states that since this is when the foot got worse.  Currently denies any systemic complaints such as fevers, chills, nausea, vomiting. No acute changes since last appointment, and no other complaints at this time.   Objective: AAO x3, NAD Scab, eschar present along the medial aspect of the hallux of both the distal hallux.  There is no edema, erythema, drainage or pus or any signs of infection.  Superficial wound which appears to be almost healed to the medial half of the heel.  No other open lesions identified.  There is no Silvadene there is no erythema or warmth of the foot.  The apparent cellulitis has resolved.  The areas of the scab, eschar are starting to come off.  There appears to be new skin underneath the area.  No drainage. No pain with calf compression, swelling, warmth, erythema          Assessment: Gangrenous changes right hallux, wound right heel with improvement  Plan: Overall her foot is doing well and appears to be much better.  Even when I saw her last week.  I want her to continue with Mepilex dressing changes daily as this appears to be doing well for her.  I ordered a surgical shoe for offloading.  I encouraged her to stay off of her foot is much as possible keep the foot elevated until the wounds heal.  Unfortunately still at risk of amputation until the wounds heal completely.  Discussed the importance of this with her.  Continue IV robotics for now can be discharged on Tuesday.  Would recommend discharge home with  doxycycline for 10 days.  I will follow-up with her in the office next Monday.  Celesta Gentile, DPM C: [phone] O: [phone]

## 2019-05-28 NOTE — Consult Note (Signed)
[facility] Nurse Consult Note: Patient's abdominal wound care discussed with provider, Dr. Rodena Piety.  She has instructed the patient and bedside nurse to continue wound care as it is directed by the outpatient wound care center [facility]).  I will provide Nursing with guidance for how to obtain supply and perform the wound care according to this plan, outlining a soap and water cleanse, with rinsing and patting dry.  This is to be followed by the placement of a silver hydrofiber dressing (Aquacel Advantage) topped with a dry dressing and secured with tape.  Three times weekly changes on Mondays, Wednesdays and Fridays are ordered.  Patient will follow up with the outpatient [facility] upon discharge.  [facility] nursing team will not follow, but will remain available to this patient, the nursing and medical teams.  Please re-consult if needed. Thanks, Maudie Flakes, MSN, RN, [place], Arther Abbott  Pager# [phone]

## 2019-05-28 NOTE — Patient Outreach (Addendum)
  [facility]) Care Management Chronic Special Needs Program    05/28/2019  Name: Alexis [name], DOB: [birth year]  MRN: [id number]   [name] is enrolled in a chronic special needs plan for Diabetes. RNCM received notification that client admitted to [facility] on 05/25/19 with cellulitis right foot and diabetic infection right great toe. Per policy and procedure individualized care plan sent to [facility] utilization management department. Client followed by Daleen Squibb; RNCM will notify [facility] care management hospital liaison of clients admission and request follow up on client's discharge disposition.  RNCM will notify [facility] of clients admission RNCM will continue to follow as client's C-[facility] care management coordinator.   Quinn Plowman RN,BSN,CCM [facility] Care Management [phone]

## 2019-05-28 NOTE — Care Management Important Message (Signed)
Important Message  Patient Details IM Letter given to Roque Lias SW Case Manager to present to the Patient Name: Alexis [name] MRN: [id number] Date of Birth: Apr 21, 1950   Medicare Important Message Given:  Yes     Kerin Salen 05/28/2019, 10:34 AM

## 2019-05-29 ENCOUNTER — Encounter: Payer: Self-pay | Admitting: Podiatry

## 2019-05-29 LAB — GLUCOSE, CAPILLARY
Glucose-Capillary: 127 mg/dL — ABNORMAL HIGH (ref 70–99)
Glucose-Capillary: 136 mg/dL — ABNORMAL HIGH (ref 70–99)

## 2019-05-29 MED ORDER — DOXYCYCLINE MONOHYDRATE 100 MG PO TABS: 100.0000 mg | ORAL_TABLET | Freq: Two times a day (BID) | ORAL | 1 refills | Status: DC

## 2019-05-29 MED ORDER — PSYLLIUM 95 % PO PACK: 1.0000 | PACK | Freq: Every day | ORAL | Status: DC

## 2019-05-29 NOTE — Plan of Care (Signed)
  Problem: Education: Goal: Knowledge of General Education information will improve Description: Including pain rating scale, medication(s)/side effects and non-pharmacologic comfort measures 05/29/2019 1256 by Rance Muir, RN Outcome: Adequate for Discharge 05/29/2019 1211 by Rance Muir, RN Outcome: Adequate for Discharge   Problem: Health Behavior/Discharge Planning: Goal: Ability to manage health-related needs will improve 05/29/2019 1256 by Rance Muir, RN Outcome: Adequate for Discharge 05/29/2019 1211 by Rance Muir, RN Outcome: Adequate for Discharge   Problem: Clinical Measurements: Goal: Ability to maintain clinical measurements within normal limits will improve 05/29/2019 1256 by Rance Muir, RN Outcome: Adequate for Discharge 05/29/2019 1211 by Rance Muir, RN Outcome: Adequate for Discharge Goal: Will remain free from infection 05/29/2019 1256 by Rance Muir, RN Outcome: Adequate for Discharge 05/29/2019 1211 by Rance Muir, RN Outcome: Adequate for Discharge Goal: Diagnostic test results will improve 05/29/2019 1256 by Rance Muir, RN Outcome: Adequate for Discharge 05/29/2019 1211 by Rance Muir, RN Outcome: Adequate for Discharge Goal: Respiratory complications will improve 05/29/2019 1256 by Rance Muir, RN Outcome: Adequate for Discharge 05/29/2019 1211 by Rance Muir, RN Outcome: Adequate for Discharge Goal: Cardiovascular complication will be avoided 05/29/2019 1256 by Rance Muir, RN Outcome: Adequate for Discharge 05/29/2019 1211 by Rance Muir, RN Outcome: Adequate for Discharge   Problem: Activity: Goal: Risk for activity intolerance will decrease 05/29/2019 1256 by Rance Muir, RN Outcome: Adequate for Discharge 05/29/2019 1211 by Rance Muir, RN Outcome: Adequate for Discharge   Problem: Nutrition: Goal: Adequate nutrition will be maintained 05/29/2019 1256 by Rance Muir, RN Outcome: Adequate for Discharge 05/29/2019 1211 by Rance Muir, RN Outcome: Adequate for Discharge   Problem:  Coping: Goal: Level of anxiety will decrease 05/29/2019 1256 by Rance Muir, RN Outcome: Adequate for Discharge 05/29/2019 1211 by Rance Muir, RN Outcome: Adequate for Discharge   Problem: Elimination: Goal: Will not experience complications related to bowel motility 05/29/2019 1256 by Rance Muir, RN Outcome: Adequate for Discharge 05/29/2019 1211 by Rance Muir, RN Outcome: Adequate for Discharge Goal: Will not experience complications related to urinary retention 05/29/2019 1256 by Rance Muir, RN Outcome: Adequate for Discharge 05/29/2019 1211 by Rance Muir, RN Outcome: Adequate for Discharge   Problem: Pain Managment: Goal: General experience of comfort will improve 05/29/2019 1256 by Rance Muir, RN Outcome: Adequate for Discharge 05/29/2019 1211 by Rance Muir, RN Outcome: Adequate for Discharge   Problem: Safety: Goal: Ability to remain free from injury will improve 05/29/2019 1256 by Rance Muir, RN Outcome: Adequate for Discharge 05/29/2019 1211 by Rance Muir, RN Outcome: Adequate for Discharge   Problem: Skin Integrity: Goal: Risk for impaired skin integrity will decrease 05/29/2019 1256 by Rance Muir, RN Outcome: Adequate for Discharge 05/29/2019 1211 by Rance Muir, RN Outcome: Adequate for Discharge

## 2019-05-29 NOTE — Discharge Summary (Signed)
Physician Discharge Summary  Alexis [name] ENI:[id number] DOB: 06/16/1950 DOA: 05/25/2019  PCP: Mackie Pai, PA-C  Admit date: 05/25/2019 Discharge date: 05/29/2019  Admitted From: Home \ disposition: Home Recommendations for Outpatient Follow-up:  1. Follow up with PCP in 1-2 weeks 2. Please obtain BMP/CBC in one week 3. Please follow up with Dr. Jacqualyn Posey as an outpatient  [place]: Yes  equipment/Devices: Offloading shoes Discharge Condition: Stable and improved CODE STATUS full code Diet recommendation: Carb modified heart healthy diet Brief/Interim Summary:68 y.o.femalewith medical history significant forinsulin-dependent diabetes mellitus,hypertension,atrial fibrillation,coronary artery disease and peripheral arterial disease status post amputation of the right second toe who was transferred from the ER at [facility] for treatment of cellulitis involvingthe right foot and diabetic foot infection involving the right great toe.Patient has been seen by podiatry and is status post debridement of the right great toe.She states that she was supposed to start antibiotic,doxycycline but that it was not called in.She developed a fever and redness involving the right foot presented to the emergency room.Her T-max was 99.28F in the ER but per patient it was 100.50F at home.Denies having any cough,no chest pain,no shortness of breath,no dizziness or lightheadedness.Denies having any abdominal pain,no nausea,no vomiting or diarrhea.Has no urinary symptoms She had an x-ray of the right foot which showedno acute displaced fracture or dislocation. No radiographic evidence for osteomyelitis  ED Course:Patient presented to the emergency room for evaluation of a fever.Patient's vital signs not meeting strict sepsis criteria. But a little bit of concern for perhaps a preseptic picture. With her heart rate up some. Never hypotensive. Initially had 2 blood pressures when  out in triage that were low. But since she was been back in the room her initial one was 614 systolic. And they have remained in that ballpark. Heart rates been in the low 100s. Covid testing is pending. Patient started on broad-spectrum antibiotics initially Zosyn for diabetic cellulitis. And vancomycin was added. Patient without any chest pain. But initial troponin was 31. Repeat 2-hour troponin was 33 so no significant delta change. Patient without a leukocytosis. Labs as far as electrolytes unchanged from baseline. Chest x-ray was negative. Patient EKG had a little bit of subtle ST changes but nothing acute. And again patient's had no chest pain I think with 2 - troponins unlikely that there is an acute cardiac component.  Patient will beadmittedto telemetry. As this could be early sepsis. In addition x-ray of the right foot showed no bony problems or any evidence of gas in the soft tissue.  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 Appreciate podiatry consult.  Recommendations from podiatry include  --She will need to set up home nursing prior to discharge. She will benefit from Betadine wet-to-dry changes 3 times a week with home nursing. -Continue offloading with flat offloading shoes. She can be weightbearing as tolerated with a offloading shoes. She received Ancef during the hospital stay and will be discharged home  on doxycycline 100  twice daily for 10 days. -MRSA PCR negative.  #2 type 2 diabetes uncontrolled with right great toe ulcer.continue home meds CBG (last 3)  Recent Labs (last 2 labs)        Recent Labs    05/28/19 0127 05/28/19 0737 05/28/19 1142  GLUCAP 149* 144* 170*     #3 PVD on statin and Plavix continue  #4 history of chronic atrial fibrillation rate controlled on metoprolol Not on any anticoagulation due to thrombocytopenia  #5 history of Karlene Lineman with cirrhosis and evidence of portal hypertension and thrombocytopenia with elevated LFTs-on Lasix spironolactone at home continue  #6 depression continue BuSpar and Effexor  #7 history of systolic congestive heart failure with ejection fraction 40% on Lasix spironolactone and Cozaar continue continue metoprolol  8 multiple lower abdominal ulcerations chronic present on admission over 1 year -continue alginate dressings as prior to admission.   Estimated body mass index is 34.75 kg/m as calculated from the following:   Height as of this encounter: 5' 2"  (1.575 m).   Weight as of this encounter: 86.2 kg.  Discharge Instructions  Discharge Instructions    Diet - low sodium heart healthy   Complete by: As directed    Increase activity slowly   Complete by: As directed      Allergies as of 05/29/2019      Reactions   Indomethacin Other (See Comments)   Renal Insufficiency   Pregabalin Other (See Comments)   DIZZINESS   Sulfa Antibiotics Hives   Morphine And Related Nausea And Vomiting      Medication List    STOP taking these medications   busPIRone 7.5 MG tablet Commonly known as: BUSPAR   dicloxacillin 500 MG capsule Commonly known as: DYNAPEN   doxycycline 100 MG tablet Commonly known as: VIBRA-TABS     TAKE these medications   acetaminophen 325 MG tablet Commonly known as: TYLENOL Take 650 mg by mouth every 6 (six) hours as needed for moderate pain or headache.   albuterol 108 (90 Base)  MCG/ACT inhaler Commonly known as: VENTOLIN HFA Inhale 2 puffs into the lungs every 6 (six) hours as needed for wheezing or shortness of breath.   allopurinol 100 MG tablet Commonly known as: ZYLOPRIM TAKE ONE TABLET BY MOUTH TWICE DAILY What changed: how to take this   [place] scooter.  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   clopidogrel 75 MG tablet Commonly known as: Plavix Take 1 tablet (75 mg total) by mouth daily.   doxycycline 100 MG tablet Commonly known as: ADOXA Take 1 tablet (100 mg total) by mouth 2 (two) times daily.   fluticasone 50 MCG/ACT nasal spray Commonly known as: FLONASE Place 2 sprays into both nostrils at bedtime. What changed: when to take this   FreeStyle Libre 14 Day Reader Kerrin Mo Use as directed to monitor BG.  Dx E11.65   furosemide 20 MG tablet Commonly known as: LASIX Take 2 tables in the morning and 1 tablet at noon What changed:   how much to take  how to take this  when to take this  additional instructions   Insulin Aspart FlexPen 100 UNIT/ML Sopn Inject 0-10 Units into the skin 3 (three) times daily with meals.   insulin degludec 100 UNIT/ML FlexTouch Pen Commonly known as: TRESIBA Inject 10 Units into the skin daily.   levocetirizine 5 MG tablet Commonly known  asHarlow Ohms Take 5 mg by mouth at bedtime.   levothyroxine 150 MCG tablet Commonly known as: SYNTHROID Take 150 mcg by mouth every morning. What changed: Another medication with the same name was removed. Continue taking this medication, and follow the directions you see here.   Lidocaine HCl 4 % Liqd Apply 10 mLs topically 2 (two) times daily. What changed:   when to take this  reasons to take this   losartan 25 MG tablet Commonly known as: COZAAR TAKE ONE (1) TABLET BY MOUTH EVERY DAY What changed: See the new instructions.    metoprolol succinate 25 MG 24 hr tablet Commonly known as: TOPROL-XL TAKE ONE (1) TABLET BY MOUTH EACH DAY What changed: See the new instructions.   multivitamin-prenatal 27-0.8 MG Tabs tablet Take 1 tablet by mouth daily.   ondansetron 4 MG disintegrating tablet Commonly known as: ZOFRAN-ODT Take 4 mg by mouth every 8 (eight) hours as needed for nausea or vomiting.   PEN NEEDLES 31GX5/16" 31G X 8 MM Misc Use as needed 4 times/day.  Dx E11.65   Probiotic Caps Take 2 capsules by mouth daily.   psyllium 95 % Pack Commonly known as: HYDROCIL/METAMUCIL Take 1 packet by mouth daily. What changed: medication strength   RABEprazole 20 MG tablet Commonly known as: Aciphex Take 1 tablet (20 mg total) by mouth daily.   spironolactone 50 MG tablet Commonly known as: Aldactone Take 1 tablet (50 mg total) by mouth daily.      Follow-up Information    Saguier, Percell Miller, PA-C Follow up.   Specialties: Internal Medicine, Family Medicine Contact information: [address] [place] [address] [phone]        Constance Haw, MD .   Specialty: Cardiology Contact information: [address] [place] [address] [phone]        Park Liter, MD .   Specialty: Cardiology Contact information: [place] [address] [phone]          Allergies  Allergen Reactions  . Indomethacin Other (See Comments)    Renal Insufficiency  . Pregabalin Other (See Comments)    DIZZINESS   . Sulfa Antibiotics Hives  . Morphine And Related Nausea And Vomiting    Consultations: Podiatry  Procedures/Studies: PERIPHERAL VASCULAR CATHETERIZATION  Result Date: 05/14/2019 Patient name: Alexis [name] MRN: [id number] DOB: [birth year] Sex: female 05/14/2019 Pre-operative Diagnosis: Critical right lower extremity ischemia Post-operative diagnosis:  Same Surgeon:  Erlene Quan C. Donzetta Matters, MD Procedure Performed: 1.   Ultrasound-guided cannulation left common femoral artery 2.  Right lower extremity angiogram 3.  Ultrasound-guided cannulation right posterior tibial artery 4.  Laser atherectomy with 1.5 mm catheter of right SFA and popliteal arteries 5.  Stent of right popliteal artery with 5 x 60 mm [place] 6.  Minx device closure left common femoral artery 7.  Moderate sedation with fentanyl and Versed 79 minutes Indications: 69 year old female with gangrenous changes to right great toe.  She is undergone angiography no intervention was undertaken.  She is now indicated for repeat angiography possible intervention from a tibial access site. Findings: SFA had 16 cm occlusive lesion extending down into the at the knee popliteal artery.  After laser atherectomy we had a channel with one area of dissection this was stented.  We ballooned the stent as well as the SFA with 4 mm balloon.  She then had brisk flow to the foot via the peroneal.  The anterior  tibial artery was occluded after the takeoff did reconstitute.  We did not demonstrate flow via the posterior tibial artery has had a wire and sheath in place completion but I suspect there is good flow there as well.  Where previously there was a 16 cm lesion there is now 0% residual stenosis.  Procedure:  The patient was identified in the holding area and taken to room 8.  The patient was then placed supine on the table and prepped and draped in the usual sterile fashion.  A time out was called.  Ultrasound was used to evaluate the left common femoral artery.  There was significant hematoma there.  The areas anesthetized 1% lidocaine cannulated with micropuncture needle followed the wire sheath.  And images saved the permanent record.  We placed a 5 French sheath followed by Bentson wire we crossed the bifurcation with Omni catheter and Glidewire advantage.  We then placed a long 6 French sheath patient was fully heparinized.  We used a bare catheter to get the lower advantage into  the SFA.  We then used a quick cross catheter.  We could not cross antegrade.  We used ultrasound to identify the posterior tibial artery.  This was large somewhat calcified.  There is anesthetized 1% lidocaine.  We cannulated posterior tibial artery at the ankle with micropuncture needle followed wire and sheath.  Images saved the permanent record.  We used a CXI catheter and V 18 wire to traverse the lesion retrograde.  We then confirmed intraluminal access.  We exchanged for an 014 wire snare this through and through.  We then performed laser atherectomy of the SFA and popliteal occlusive segment.  This was then ballooned with 3.5 mm balloon.  Completion demonstrated dissection of the just above the knee joint.  This was primarily stented with [place] stent.  The entire segment was postdilated with 4 mm balloon at nominal pressure.  Completion demonstrated brisk flow through the peroneal artery.  The anterior tibial artery occluded but did reconstitute.  Posterior tibial appears to be patent however there is a wire and sheath in place.  There is much better filling of the foot than her previous imaging.  Satisfied we remove the wire sheath.  We exchanged over a stiffer wire for short 6 French sheath a minx device was deployed.  We remove the sheath at the ankle pressure was held till hemostasis obtained.  She tolerated seizure without any complication. Contrast: 75cc Brandon C. Donzetta Matters, MD [facility] Office: [phone] Pager: [phone]  PERIPHERAL VASCULAR CATHETERIZATION  Result Date: 05/04/2019 PATIENT: Alexis [name]      MRN: [id number] DOB: [birth year]    DATE OF PROCEDURE: 05/04/2019 INDICATIONS:  AILANIE RUTTAN is a 69 y.o. female who presents with critical limb ischemia.  She has a gangrenous wound in her right great toe and also a wound on her heel.  She is undergone previous atherectomy and angioplasty of the right lower extremity.  She was seen in the office 2 days  ago with markedly diminished flow in the right foot and clearly limb threatening ischemia.  She is brought in for arteriography. PROCEDURE:  1.  Conscious sedation 2.  Ultrasound-guided access to the left common femoral artery 3.  Aortogram with bilateral iliac arteriogram 4.  Selective catheterization of the right external iliac artery with right lower extremity runoff 5.  Retrograde left femoral arteriogram SURGEON: Judeth Cornfield. Scot Dock, MD, FACS ANESTHESIA: Local with sedation EBL: Minimal TECHNIQUE:  The  patient was brought to the peripheral vascular lab and was sedated. The period of conscious sedation was 43 minutes.  During that time period, I was present face-to-face 100% of the time.  The patient was administered 1 mg of Versed and 50 mcg of fentanyl. The patient's heart rate, blood pressure, and oxygen saturation were monitored by the nurse continuously during the procedure. Both groins were prepped and draped in the usual sterile fashion.  Under ultrasound guidance, after the skin was anesthetized, I cannulated the left common femoral artery with a micropuncture needle and a micropuncture sheath was introduced over a wire.  This was exchanged for a 5 [place] sheath over a Bentson wire.  By ultrasound the femoral artery was patent. A real-time image was obtained and sent to the server. The pigtail catheter was positioned at the L1 vertebral body.  Flush aortogram was obtained.  The catheter was in position above the aortic bifurcation and an oblique iliac projection was obtained.  Next the pigtail catheter was exchanged for a crossover catheter which was positioned into the right common iliac artery.  The wire was advanced into the external iliac artery and the crossover catheter exchanged for a Nava 6 catheter which was positioned in the distal right external iliac artery.  Selective right external iliac arteriogram was obtained with right lower extremity runoff. This catheter was then removed and a  retrograde left femoral arteriogram obtained.  The sheath was then removed and pressure held for hemostasis.  No immediate complications were noted. FINDINGS: 1.  There are single renal arteries bilaterally with no significant renal artery stenosis identified. 2.  On the right side, which is the side of concern, there is no significant inflow disease.  The common iliac external iliac and hypogastric arteries are patent. 3. On the right side, the patient has severe infrainguinal arterial occlusive disease.  The common femoral and deep femoral artery are patent.  There is mild to moderate diffuse disease in the proximal superficial femoral artery with an occlusion at the adductor canal and disease in the popliteal artery below that.  There is reconstitution of a diseased tibial peroneal trunk.  The peroneal artery is patent with moderate disease in the posterior tibial artery is small but patent.  The anterior tibial artery is occluded. 4.  On the left side, there is no significant inflow disease.  The common iliac, external iliac, and hypogastric arteries are patent. 5.  On the left side, there is severe infrainguinal arterial occlusive disease.  The common femoral and deep femoral artery are patent.  There is moderate disease of the superficial femoral artery throughout with severe disease distally.  It appears that the below-knee popliteal artery is patent with distal stenosis distally.  The anterior tibial is occluded.  There appears to be single-vessel runoff on the left via the peroneal artery. CLINICAL NOTE: This patient clearly has a limb threatening situation on the right foot.  Without revascularization she will require below the knee or above the knee amputation.  Her only options for revascularization would be a right femoral to posterior tibial bypass versus a retrograde endovascular approach.  Both would be associated with significant increased risk.  I have also explained that even with  revascularization given the extent of the wounds she is at high risk for limb loss.  I will obtain a vein map of her right great saphenous vein.  If she has a nice vein for bypass then certainly this could be an option.  If the  vein is not adequate then I would not recommend placing a prosthetic graft given her diabetes and obesity.  In addition she has a significant cardiac history and we will have to arrange preoperative cardiac evaluation by [facility]. [name], MD, FACS [facility] DATE OF DICTATION:   05/04/2019  DG Chest Port 1 View  Result Date: 05/25/2019 CLINICAL DATA:  Fever and chills EXAM: PORTABLE CHEST 1 VIEW COMPARISON:  November 08, 2018 FINDINGS: Again noted is mild cardiomegaly. A left-sided pacemaker seen with the lead tips in the right atrium right ventricle. There is prominence of the central pulmonary vasculature. No large airspace consolidation or pleural effusion. No acute osseous abnormality. IMPRESSION: Cardiomegaly and pulmonary vascular congestion. Electronically Signed   By: Prudencio Pair M.D.   On: 05/25/2019 19:24   DG Foot Complete Right  Result Date: 05/25/2019 CLINICAL DATA:  Infection. EXAM: RIGHT FOOT COMPLETE - 3+ VIEW COMPARISON:  None. FINDINGS: The patient is status post prior amputation through the proximal aspect of the proximal phalanx of the second digit. There is no convincing radiographic evidence for osteomyelitis. There is no acute displaced fracture or dislocation. There may be some mild soft tissue swelling without evidence for associated subcutaneous gas. Vascular calcifications are noted. There is a small plantar calcaneal spur. There is an Achilles tendon enthesophyte. IMPRESSION: 1. No acute displaced fracture or dislocation. No radiographic evidence for osteomyelitis. 2. Postsurgical changes as above. Electronically Signed   By: Constance Holster M.D.   On: 05/25/2019 19:25   VAS [place] ABI WITH/WO TBI  Result  Date: 05/03/2019 LOWER EXTREMITY DOPPLER STUDY Indications: Ulceration, peripheral artery disease, and Peripheral vascular              disease. High Risk         Hypertension, hyperlipidemia, past history of smoking, prior Factors:          MI.  Vascular Interventions: 04/29/2016: Jetstream atherectomy of right SFA and                         popliteal arteries; Drug coated balloon angioplasty of                         Right sfa and popliteal arteries with 73m impact. Comparison       Prior ABIs performed on 10/04/2018 were Right: 0.41 and Left: Study:           0.66 Performing Technologist: MDelorise ShinerRVT  Examination Guidelines: A complete evaluation includes at minimum, Doppler waveform signals and systolic blood pressure reading at the level of bilateral brachial, anterior tibial, and posterior tibial arteries, when vessel segments are accessible. Bilateral testing is considered an integral part of a complete examination. Photoelectric Plethysmograph (PPG) waveforms and toe systolic pressure readings are included as required and additional duplex testing as needed. Limited examinations for reoccurring indications may be performed as noted.  ABI Findings: +---------+------------------+-----+----------+--------+ Right    Rt Pressure (mmHg)IndexWaveform  Comment  +---------+------------------+-----+----------+--------+ Brachial 161                                       +---------+------------------+-----+----------+--------+ ATA      42                0.26                    +---------+------------------+-----+----------+--------+  PTA      66                0.41 monophasic         +---------+------------------+-----+----------+--------+ DP                              monophasic         +---------+------------------+-----+----------+--------+ Great Toe0                 0.00                    +---------+------------------+-----+----------+--------+  +---------+------------------+-----+----------+-------+ Left     Lt Pressure (mmHg)IndexWaveform  Comment +---------+------------------+-----+----------+-------+ Brachial 160                                      +---------+------------------+-----+----------+-------+ ATA      74                0.46                   +---------+------------------+-----+----------+-------+ PTA      88                0.55 monophasic        +---------+------------------+-----+----------+-------+ DP                              monophasic        +---------+------------------+-----+----------+-------+ Great Toe0                 0.00                   +---------+------------------+-----+----------+-------+ +-------+-----------+-----------+------------+------------+ ABI/TBIToday's ABIToday's TBIPrevious ABIPrevious TBI +-------+-----------+-----------+------------+------------+ Right  0.41       0          0.41        0            +-------+-----------+-----------+------------+------------+ Left   0.55       0          0.66        0            +-------+-----------+-----------+------------+------------+ Right ABIs appear essentially unchanged compared to prior study on 10/04/2018. Left ABIs appear decreased compared to prior study on 10/04/2018.  Summary: Right: Resting right ankle-brachial index indicates severe right lower extremity arterial disease. The right toe-brachial index is abnormal. RT great toe pressure = 0 mmHg. Left: Resting left ankle-brachial index indicates moderate left lower extremity arterial disease. The left toe-brachial index is abnormal. LT Great toe pressure = 0 mmHg.  *See table(s) above for measurements and observations.  Electronically signed by [name] MD on 05/03/2019 at 12:47:23 PM.    Final    VAS [place]  Result Date: 05/04/2019 LOWER EXTREMITY VEIN MAPPING Indications:  PVD with gangrene Risk Factors:  Hypertension, hyperlipidemia, Diabetes.  Performing Technologist: Abram Sander RVS  Examination Guidelines: A complete evaluation includes B-mode imaging, spectral Doppler, color Doppler, and power Doppler as needed of all accessible portions of each vessel. Bilateral testing is considered an integral part of a complete examination. Limited examinations for reoccurring indications may be performed as noted. +---------------+-----------+----------------------+---------------+-----------+   RT Diameter  RT Findings         GSV  LT Diameter  LT Findings      (cm)                                            (cm)                  +---------------+-----------+----------------------+---------------+-----------+      0.36                     Saphenofemoral                                                                Junction                                  +---------------+-----------+----------------------+---------------+-----------+      0.37                     Proximal thigh                               +---------------+-----------+----------------------+---------------+-----------+      0.43                       Mid thigh                                  +---------------+-----------+----------------------+---------------+-----------+      0.36                      Distal thigh                                +---------------+-----------+----------------------+---------------+-----------+      0.29                          Knee                                    +---------------+-----------+----------------------+---------------+-----------+      0.17                       Prox calf                                  +---------------+-----------+----------------------+---------------+-----------+      0.20                        Mid calf                                  +---------------+-----------+----------------------+---------------+-----------+       0.23  Distal calf                                 +---------------+-----------+----------------------+---------------+-----------+      0.19                         Ankle                                    +---------------+-----------+----------------------+---------------+-----------+ +----------------+-----------+---------------+----------------+-----------+ RT diameter (cm)RT Findings      SSV      LT Diameter (cm)LT Findings +----------------+-----------+---------------+----------------+-----------+       0.29                 Popliteal fossa                            +----------------+-----------+---------------+----------------+-----------+       0.27                  Proximal calf                             +----------------+-----------+---------------+----------------+-----------+      242.00                   Mid calf                                +----------------+-----------+---------------+----------------+-----------+ Diagnosing physician: Monica Martinez MD Electronically signed by Monica Martinez MD on 05/04/2019 at 4:42:40 PM.    Final     (Echo, Carotid, EGD, Colonoscopy, ERCP)    Subjective:  Patient resting in bed no acute distress anxious to go home Discharge Exam: Vitals:   05/29/19 0505 05/29/19 0830  BP: (!) 144/68 (!) 149/69  Pulse: 78 68  Resp: 20   Temp: 98 F (36.7 C) 97.6 F (36.4 C)  SpO2: 99% 100%   Vitals:   05/28/19 1516 05/28/19 2110 05/29/19 0505 05/29/19 0830  BP: (!) 141/69 127/82 (!) 144/68 (!) 149/69  Pulse: 99 72 78 68  Resp: 20 19 20    Temp: 98.4 F (36.9 C) 98 F (36.7 C) 98 F (36.7 C) 97.6 F (36.4 C)  TempSrc: Oral   Oral  SpO2: 98% 99% 99% 100%  Weight:      Height:        General: Pt is alert, awake, not in acute distress Cardiovascular: RRR, S1/S2 +, no rubs, no gallops Respiratory: CTA bilaterally, no wheezing, no rhonchi Abdominal: Soft, NT, ND, bowel sounds  + Extremities: Right leg cellulitis with ulceration  The results of significant diagnostics from this hospitalization (including imaging, microbiology, ancillary and laboratory) are listed below for reference.     Microbiology: Recent Results (from the past 240 hour(s))  Culture, blood (Routine X 2) w Reflex to ID Panel     Status: None (Preliminary result)   Collection Time: 05/25/19  5:39 PM   Specimen: Right Antecubital; Blood  Result Value Ref Range Status   Specimen Description   Final    RIGHT ANTECUBITAL Performed at [facility], [address] [place], [place] [address]    Special Requests   Final    BOTTLES DRAWN AEROBIC AND  ANAEROBIC Blood Culture results may not be optimal due to an inadequate volume of blood received in culture bottles Performed at [facility], [address] [place], [place] [address]    Culture   Final    NO GROWTH 4 DAYS Performed at [facility], [place] [address], [place], [place] [address]    Report Status PENDING  Incomplete  SARS CORONAVIRUS 2 (TAT 6-24 HRS) Nasopharyngeal Nasopharyngeal Swab     Status: None   Collection Time: 05/25/19  5:39 PM   Specimen: Nasopharyngeal Swab  Result Value Ref Range Status   SARS Coronavirus 2 NEGATIVE NEGATIVE Final    Comment: (NOTE) SARS-CoV-2 target nucleic acids are NOT DETECTED. The SARS-CoV-2 RNA is generally detectable in upper and lower respiratory specimens during the acute phase of infection. Negative results do not preclude SARS-CoV-2 infection, do not rule out co-infections with other pathogens, and should not be used as the sole basis for treatment or other patient management decisions. Negative results must be combined with clinical observations, patient history, and epidemiological information. The expected result is Negative. Fact Sheet for Patients: SugarRoll.be Fact Sheet for Healthcare  Providers: https://www.woods-[employer]onnette Shrode.com/ This test is not yet approved or cleared by the [place] FDA and  has been authorized for detection and/or diagnosis of SARS-CoV-2 by FDA under an Emergency Use Authorization (EUA). This EUA will remain  in effect (meaning this test can be used) for the duration of the COVID-19 declaration under Section 56 4(b)(1) of the Act, 21 U.S.C. section 360bbb-3(b)(1), unless the authorization is terminated or revoked sooner. Performed at [facility], [place] [address], [place], [place] [address]   Culture, blood (Routine X 2) w Reflex to ID Panel     Status: None (Preliminary result)   Collection Time: 05/25/19  5:40 PM   Specimen: Left Antecubital; Blood  Result Value Ref Range Status   Specimen Description   Final    LEFT ANTECUBITAL Performed at [facility], [address] [place], [place] [address]    Special Requests   Final    BOTTLES DRAWN AEROBIC AND ANAEROBIC Blood Culture results may not be optimal due to an inadequate volume of blood received in culture bottles Performed at [facility], [address] [place], [place] [address]    Culture   Final    NO GROWTH 4 DAYS Performed at [facility], [place] [address], [place], [place] [address]    Report Status PENDING  Incomplete  MRSA PCR Screening     Status: None   Collection Time: 05/26/19  2:54 PM   Specimen: Nasopharyngeal  Result Value Ref Range Status   MRSA by PCR NEGATIVE NEGATIVE Final    Comment:        The GeneXpert MRSA Assay (FDA approved for NASAL specimens only), is one component of a comprehensive MRSA colonization surveillance program. It is not intended to diagnose MRSA infection nor to guide or monitor treatment for MRSA infections. Performed at [facility], [place] [address], [place], [place] [address]      Labs: BNP (last 3 results) Recent Labs    08/31/18 1441 09/09/18 1531  BNP 81.6  458.0*   Basic Metabolic Panel: Recent Labs  Lab 05/23/19 1338 05/25/19 1739 05/27/19 0604  NA 128* 131* 136  K 4.0 4.0 3.5  CL 91* 94* 105  CO2 30 25 21*  GLUCOSE 175* 173* 168*  BUN 55* 46*  30*  CREATININE 0.96 1.10* 0.64  CALCIUM 9.9 9.7 8.9   Liver Function Tests: Recent Labs  Lab 05/23/19 1338 05/25/19 1739  AST 78* 163*  ALT 69* 135*  ALKPHOS 256* 218*  BILITOT 0.9 1.1  PROT 7.4 7.4  ALBUMIN 4.0 3.7   No results for input(s): LIPASE, AMYLASE in the last 168 hours. No results for input(s): AMMONIA in the last 168 hours. CBC: Recent Labs  Lab 05/25/19 1739 05/27/19 0604  WBC 6.1 5.0  NEUTROABS 4.9  --   HGB 14.2 12.7  HCT 42.1 38.1  MCV 101.7* 104.4*  PLT 71* 62*   Cardiac Enzymes: No results for input(s): CKTOTAL, CKMB, CKMBINDEX, TROPONINI in the last 168 hours. BNP: Invalid input(s): POCBNP CBG: Recent Labs  Lab 05/28/19 0737 05/28/19 1142 05/28/19 1653 05/28/19 2112 05/29/19 0804  GLUCAP 144* 170* 133* 159* 127*   D-Dimer No results for input(s): DDIMER in the last 72 hours. Hgb A1c Recent Labs    05/26/19 1649  HGBA1C 7.7*   Lipid Profile No results for input(s): CHOL, HDL, LDLCALC, TRIG, CHOLHDL, LDLDIRECT in the last 72 hours. Thyroid function studies No results for input(s): TSH, T4TOTAL, T3FREE, THYROIDAB in the last 72 hours.  Invalid input(s): FREET3 Anemia work up No results for input(s): VITAMINB12, FOLATE, FERRITIN, TIBC, IRON, RETICCTPCT in the last 72 hours. Urinalysis    Component Value Date/Time   COLORURINE YELLOW 04/05/2016 [place] 04/05/2016 1203   LABSPEC 1.022 04/05/2016 1203   PHURINE 6.0 04/05/2016 1203   GLUCOSEU 250 (A) 04/05/2016 1203   HGBUR NEGATIVE 04/05/2016 [place] 04/05/2016 [place] 04/05/2016 [place] 04/05/2016 1203   NITRITE NEGATIVE 04/05/2016 1203   LEUKOCYTESUR TRACE (A) 04/05/2016 1203   Sepsis Labs Invalid  input(s): PROCALCITONIN,  WBC,  LACTICIDVEN Microbiology Recent Results (from the past 240 hour(s))  Culture, blood (Routine X 2) w Reflex to ID Panel     Status: None (Preliminary result)   Collection Time: 05/25/19  5:39 PM   Specimen: Right Antecubital; Blood  Result Value Ref Range Status   Specimen Description   Final    RIGHT ANTECUBITAL Performed at [facility], [address] [place], [place] [address]    Special Requests   Final    BOTTLES DRAWN AEROBIC AND ANAEROBIC Blood Culture results may not be optimal due to an inadequate volume of blood received in culture bottles Performed at [facility], [address] [place], [place] [address]    Culture   Final    NO GROWTH 4 DAYS Performed at [facility], [place] [address], [place], [place] [address]    Report Status PENDING  Incomplete  SARS CORONAVIRUS 2 (TAT 6-24 HRS) Nasopharyngeal Nasopharyngeal Swab     Status: None   Collection Time: 05/25/19  5:39 PM   Specimen: Nasopharyngeal Swab  Result Value Ref Range Status   SARS Coronavirus 2 NEGATIVE NEGATIVE Final    Comment: (NOTE) SARS-CoV-2 target nucleic acids are NOT DETECTED. The SARS-CoV-2 RNA is generally detectable in upper and lower respiratory specimens during the acute phase of infection. Negative results do not preclude SARS-CoV-2 infection, do not rule out co-infections with other pathogens, and should not be used as the sole basis for treatment or other patient management decisions. Negative results must be combined with clinical observations, patient history, and epidemiological information. The expected result is Negative. Fact Sheet for Patients:  SugarRoll.be Fact Sheet for Healthcare Providers: https://www.woods-Catheryn Slifer.com/ This test is not yet approved or cleared by the [place] FDA and  has been authorized for detection and/or diagnosis of SARS-CoV-2 by FDA under an Emergency  Use Authorization (EUA). This EUA will remain  in effect (meaning this test can be used) for the duration of the COVID-19 declaration under Section 56 4(b)(1) of the Act, 21 U.S.C. section 360bbb-3(b)(1), unless the authorization is terminated or revoked sooner. Performed at [facility], [place] [address], [place], [place] [address]   Culture, blood (Routine X 2) w Reflex to ID Panel     Status: None (Preliminary result)   Collection Time: 05/25/19  5:40 PM   Specimen: Left Antecubital; Blood  Result Value Ref Range Status   Specimen Description   Final    LEFT ANTECUBITAL Performed at [facility], [address] [place], [place] [address]    Special Requests   Final    BOTTLES DRAWN AEROBIC AND ANAEROBIC Blood Culture results may not be optimal due to an inadequate volume of blood received in culture bottles Performed at [facility], [address] [place], [place] [address]    Culture   Final    NO GROWTH 4 DAYS Performed at [facility], [place] [address], [place], [place] [address]    Report Status PENDING  Incomplete  MRSA PCR Screening     Status: None   Collection Time: 05/26/19  2:54 PM   Specimen: Nasopharyngeal  Result Value Ref Range Status   MRSA by PCR NEGATIVE NEGATIVE Final    Comment:        The GeneXpert MRSA Assay (FDA approved for NASAL specimens only), is one component of a comprehensive MRSA colonization surveillance program. It is not intended to diagnose MRSA infection nor to guide or monitor treatment for MRSA infections. Performed at [facility], [place] [address] [place], [place] [address]      Time coordinating discharge:  38 minutes  SIGNED:   Georgette Shell, MD  [facility] 05/29/2019, 10:42 AM Pager   If 7PM-7AM, please contact night-coverage [email or web address] Password [email or web address]

## 2019-05-29 NOTE — TOC Transition Note (Signed)
Transition of Care [facility]) - CM/SW Discharge Note   Patient Details  Name: [name] MRN: [id number] Date of Birth: Oct 01, 1950  Transition of Care [facility] CM/SW Contact:  Trish Mage, LCSW Phone Number: 05/29/2019, 2:04 PM   Clinical Narrative:  Patient to d/c today.  She has a ride home.  Confirmed Dr order for [facility] RN and PT with Amy of [facility], who is already working with patient.  No other needs identified. TOC sign off.     Final next level of care: [place] Barriers to Discharge: No Barriers Identified   Patient Goals and CMS Choice        Discharge Placement                       Discharge Plan and Services                                     Social Determinants of Health (SDOH) Interventions     Readmission Risk Interventions No flowsheet data found.

## 2019-05-29 NOTE — Evaluation (Signed)
Physical Therapy One Time Evaluation Patient Details Name: Alexis [name] MRN: [id number] DOB: [birth year] Today's Date: 05/29/2019   History of Present Illness  69 y.o. female with medical history significant for insulin-dependent diabetes mellitus, hypertension, atrial fibrillation, coronary artery disease and peripheral arterial disease status post amputation of the right second toe who was transferred from the ER at [facility] for treatment of cellulitis involving the right foot and diabetic foot infection involving the right great toe.  Patient has been seen by podiatry and is status post debridement of the right great toe.  Clinical Impression  Patient evaluated by Physical Therapy with no further acute PT needs identified. All education has been completed and the patient has no further questions.  Pt educated on using post op shoe and encouraged to use RW upon d/c for assisting with offloading.  See below for any follow-up Physical Therapy or equipment needs. PT is signing off. Thank you for this referral.     Follow Up Recommendations Home health PT(resume HHPT)    Equipment Recommendations  Rolling walker with 5" wheels    Recommendations for Other Services       Precautions / Restrictions Precautions Required Braces or Orthoses: Other Brace Other Brace: post op shoe Restrictions Weight Bearing Restrictions: No      Mobility  Bed Mobility Overal bed mobility: Modified Independent                Transfers Overall transfer level: Needs assistance Equipment used: Rolling walker (2 wheeled) Transfers: Sit to/from Stand Sit to Stand: Supervision         General transfer comment: cues for use of RW  Ambulation/Gait Ambulation/Gait assistance: Supervision Gait Distance (Feet): 120 Feet Assistive device: Rolling walker (2 wheeled) Gait Pattern/deviations: Step-through pattern;Decreased stride length     General Gait Details: verbal cues for safe use of RW,  pt encouraged to use RW upon d/c for assisting with off loading and aware to have on post op shoe if standing or ambulating  Stairs            Wheelchair Mobility    Modified Rankin (Stroke Patients Only)       Balance                                             Pertinent Vitals/Pain Pain Assessment: No/denies pain    Home Living Family/patient expects to be discharged to:: Private residence Living Arrangements: Alone   Type of Home: House Home Access: Stairs to enter;Ramped entrance     Home Layout: One level Home Equipment: None      Prior Function Level of Independence: Independent               Hand Dominance        Extremity/Trunk Assessment        Lower Extremity Assessment Lower Extremity Assessment: RLE deficits/detail RLE Deficits / Details: dressings in place, pt educated on post op shoe, strength WFL for tasks assessed       Communication   Communication: No difficulties  Cognition Arousal/Alertness: Awake/alert Behavior During Therapy: WFL for tasks assessed/performed Overall Cognitive Status: Within Functional Limits for tasks assessed  General Comments      Exercises     Assessment/Plan    PT Assessment All further PT needs can be met in the next venue of care  PT Problem List Decreased strength;Decreased activity tolerance;Decreased knowledge of use of DME;Impaired sensation;Decreased knowledge of precautions;Decreased mobility       PT Treatment Interventions      PT Goals (Current goals can be found in the Care Plan section)  Acute Rehab PT Goals PT Goal Formulation: All assessment and education complete, DC therapy    Frequency     Barriers to discharge        Co-evaluation               AM-PAC PT "6 Clicks" Mobility  Outcome Measure Help needed turning from your back to your side while in a flat bed without using bedrails?:  None Help needed moving from lying on your back to sitting on the side of a flat bed without using bedrails?: None Help needed moving to and from a bed to a chair (including a wheelchair)?: None Help needed standing up from a chair using your arms ([name].g., wheelchair or bedside chair)?: A Little Help needed to walk in hospital room?: A Little Help needed climbing 3-5 steps with a railing? : A Little 6 Click Score: 21    End of Session   Activity Tolerance: Patient tolerated treatment well Patient left: in bed;with call bell/phone within reach Nurse Communication: Mobility status PT Visit Diagnosis: Other abnormalities of gait and mobility (R26.89);Muscle weakness (generalized) (M62.81)    Time: 1127-1147 PT Time Calculation (min) (ACUTE ONLY): 20 min   Charges:   PT Evaluation $PT Eval Low Complexity: 1 Low         Kati PT, DPT Acute Rehabilitation Services Office: [phone]  Alexis [name],Alexis [name] 05/29/2019, 12:18 PM   

## 2019-05-29 NOTE — Plan of Care (Signed)

## 2019-05-30 ENCOUNTER — Other Ambulatory Visit: Payer: Self-pay

## 2019-05-30 LAB — CULTURE, BLOOD (ROUTINE X 2)
Culture: NO GROWTH
Culture: NO GROWTH

## 2019-05-30 NOTE — Patient Outreach (Signed)
[facility]) Care Management Chronic Special Needs Program    05/30/2019  Name: Alexis [name], DOB: 1950/10/18  MRN: [id number]   Alexis [name] is enrolled in a chronic special needs plan for Heart Failure. Client admitted on 05/25/19 with cellulitis of right foot and diabetes foot infection to right great toe.  Discharged to home on 05/29/19 with home health. Reviewed and updated individualized care plan. Transition of care to be completed by [facility] general discharge.   Goals    . A1c goal <7%     Per client recent Hgb A1c is 8.0 Goal renewed Discussed diabetes self management actions:  Glucose monitoring per provider recommendation  Visit provider every 3-6 months as directed  Hbg A1C level every 3-6 months.  Carbohydrate controlled meal planning  Taking diabetes medication as prescribed by provider  Physical activity     . Client verbalize knowledge of Heart Failure disease self management skills by 02/16/19     Client verbalizes understanding of heart failure self management. Client denies ED or hospitalization related to heart failure. Client reports taking medication as prescribed Attends doctor appointments as scheduled.  Client reports weighing daily and recording.  Goal Renewed 2021.  Attend doctor appointments Signs and symptoms of heart failure reviewed.  Advised to notify doctor for mild to moderate symptoms.  Call 911 for severe symptoms Weight daily and record weights  Continue with low salt diet.   Discussed heart failure action plan.  Review Action plan in HTA calendar      . Client verbalizes knowledge of Heart Attack self management skills by 04/30/19     Client verbalizes understanding of heart attack self management skills Heart attack signs/ symptoms discussed/ reviewed. Goal renewed 2021 Take your medications as prescribed.  RN case manager discussed heart attack signs/ symptoms Advised to call 911 for symptoms Keep  scheduled appointments with provider Send education article: Lowering your risk of heart disease      . Client will increase activity tolerance within 3 months     Client reports she continues to use cane, wheelchair, and scooter to assist with ambulation Client reports having home health physical therapy with [facility].  Goal renewed 2021 Continue to utilize medical equipment as advised for ambulation Continue to do home exercises provided by home health physical therapist.     . Client will report no worsening of symptoms of Atrial Fibrillation within the next 3 months     Client states she uses a smart watch to monitor her blood pressure and heart rate and states her cardiologist is aware.  Client denies symptoms related to Atrial fibrillation Goal renewed 2021 Take your medication as prescribed Keep follow up appointments with your doctor.  Signs and symptoms of atrial fibrillation discussed.  Report symptoms to your doctor.  RN case manager will send education article: Atrial fibrillation and the risk of stroke      . Client will report no worsening of symptoms related to heart disease within the next 3 months     Client denies symptoms related to heart disease Client reports keeping her follow up appointments with her cardiologist.  Cardiology visit 04/19/19 Goal renewed 2021 Continue to take your medication as prescribed.  Continue to follow up with your doctor. Call your doctor if you have questions / concerns Heart attack signs/ symptoms discussed. Call 911 for heart attack symptoms.  RN case manager will send client education article: Lowering your risk of heart disease.     Marland Kitchen  Client will verbalize knowledge of chronic lung disease as evidenced by no ED visits or Inpatient stays related to chronic lung disease      [facility] care management pharmacist contacted client on 02/19/19 to assist client with medication needs.  Client denies having any emergency department  or hospitalizations related to chronic lung conditions.  Goal renewed 2021 Take your medications as prescribed.  Client advised to keep scheduled appointments with provider Call your doctor if you have questions.       . Client will verbalize knowledge of self management of Hypertension as evidences by BP reading of 140/90 or less; or as defined by provider     Client reports using smart watch to monitor blood pressure and reports results to provider. Client states she takes her medications as prescribed.  Client states she limits her salt intake.  Client reports using her smart watch to monitor her blood pressure.  Goal renewed 2021 Take medications as prescribed by provider Ask Your doctor, "what is my target blood pressure range."  Follow up with your doctor as recommended.  Continue to monitor your blood pressure as advised and take result to your doctor appointments.  Review High blood pressure information in  HTA calendar.     . Client will verbalize understanding of treatment plan for impaired skin integrity and follow up with provider by 04/30/18     Client reports having abdominal dressing changes 2 times per week with [facility]. Client reports having surgical procedure to her right lower extremity to improve blood flow to toe/foot. Goal renewed 2021 Sign/symptoms of infection reviewed with client Contact your doctor for signs of infections.      . General - Client will not be readmitted within 30 days (C-SNP)     Client discharged on 05/29/19 Please follow discharge instructions and call provider if you have any questions. Please attend all follow up appointments as scheduled. Please take your medications as prescribed. Please call 24 Hour nurse advice line as needed [phone]).     Marland Kitchen HEMOGLOBIN A1C < 7.0     Per client recent A1c is 8.0 Goal renewed 2021: Discussed diabetes self management actions:  Glucose monitoring per provider  recommendation  Visit provider every 3-6 months as directed  Hbg A1C level every 3-6 months.  Carbohydrate controlled meal planning  Taking diabetes medication as prescribed by provider  Physical activity     . Maintain timely refills of diabetic medication as prescribed within the year .     Client reports taking her diabetic medication as prescribed.  Review of clients medical record indicates client maintains timely refills of diabetic medications Goal renewed 2021 Continue to take your medications as prescribed Follow up with your doctor if you have questions Contact your assigned RN care manager if you have difficulty obtaining your medication RN case manager has referred you to  [facility] for medication review.     . Obtain annual  Lipid Profile, LDL-C     Lipid completed 05/10/18 Goal renewed 2021 The goal for LDL is less than 70 mg/ dl as you are at high risk for complications try to avoid saturated fats, trans-fats, and eat more fiber. RN case manager will send client education article: Heart healthy diet.     Illa Level Annual Eye (retinal)  Exam      Annual eye exam 04/13/17 Goal renewed 2021 Client reports next scheduled eye exam is April 2021 Discussed with client importance of yearly eye exam.     .  Obtain Annual Foot Exam     Annual foot exam completed 04/24/19 Goal renewed 2021 Diabetes foot care - Check feet daily at home (look for skin color changes, cuts, sores or cracks in the skin, swelling of feet or ankles, ingrown or fungal toenails, corn or calluses). Report these findings to your doctor - Wash feet with soap and water, dry feet well especially between toes - Moisturize your feet but not between the toes - Always wear shoes that protect your whole feet.      . Obtain annual screen for micro albuminuria (urine) , nephropathy (kidney problems)     No recent microalbumin noted in chart Goal renewed 2021 Continue to obtain yearly physicals and follow up  visit with         . Pharmacy Care Plan     Current Barriers:  . Chronic Disease Management support, education, and care coordination needs related to DM, CHF, PVD, CHF, AFib, HTN, Hypothyroid, Osteoarthritis, Gout, HLD  Pharmacist Clinical Goal(s):  Marland Kitchen A1c goal <7%  Interventions: . Patient scheduled with Endocrinology for 04/30/19 . Patient to get set up with V-go or basal insulin regimen . Initial visit rescheduled for completion  Patient Self Care Activities:  . Patient verbalizes understanding of plan to follow as described above, Self administers medications as prescribed, Calls pharmacy for medication refills, and Calls provider office for new concerns or questions  Initial goal documentation     . Weight < 200 lb (90.719 kg)     Client states she weighs daily and records the weight, she reports current weight is 207 lbs        PLAN: Care plan updated and sent to client and primary care provider.  RNCM will continue to follow and collaborate/ care coordinate as needed.   Quinn Plowman RN,BSN,CCM [facility] Care Management [phone]

## 2019-05-31 ENCOUNTER — Telehealth: Payer: Self-pay | Admitting: *Deleted

## 2019-05-31 ENCOUNTER — Ambulatory Visit: Payer: HMO | Admitting: Podiatry

## 2019-05-31 ENCOUNTER — Telehealth: Payer: Self-pay | Admitting: Medical

## 2019-05-31 DIAGNOSIS — I110 Hypertensive heart disease with heart failure: Secondary | ICD-10-CM | POA: Diagnosis not present

## 2019-05-31 DIAGNOSIS — I509 Heart failure, unspecified: Secondary | ICD-10-CM | POA: Diagnosis not present

## 2019-05-31 DIAGNOSIS — N390 Urinary tract infection, site not specified: Secondary | ICD-10-CM | POA: Diagnosis not present

## 2019-05-31 DIAGNOSIS — S31109D Unspecified open wound of abdominal wall, unspecified quadrant without penetration into peritoneal cavity, subsequent encounter: Secondary | ICD-10-CM | POA: Diagnosis not present

## 2019-05-31 DIAGNOSIS — E119 Type 2 diabetes mellitus without complications: Secondary | ICD-10-CM | POA: Diagnosis not present

## 2019-05-31 DIAGNOSIS — G609 Hereditary and idiopathic neuropathy, unspecified: Secondary | ICD-10-CM | POA: Diagnosis not present

## 2019-05-31 DIAGNOSIS — I4891 Unspecified atrial fibrillation: Secondary | ICD-10-CM | POA: Diagnosis not present

## 2019-05-31 NOTE — Telephone Encounter (Signed)
[facility] called  patient resuming care , nursing & physical home health , and treating right toe. Ant question call [phone]

## 2019-05-31 NOTE — Telephone Encounter (Signed)
1st attempt. Unable to reach patient. LVM for pt to call office to schedule hospital follow up appointment.

## 2019-05-31 NOTE — Telephone Encounter (Signed)
This encounter was created in error - please disregard.

## 2019-06-04 ENCOUNTER — Ambulatory Visit (INDEPENDENT_AMBULATORY_CARE_PROVIDER_SITE_OTHER): Payer: HMO | Admitting: Podiatry

## 2019-06-04 ENCOUNTER — Encounter: Payer: Self-pay | Admitting: Podiatry

## 2019-06-04 ENCOUNTER — Other Ambulatory Visit: Payer: Self-pay

## 2019-06-04 VITALS — Temp 94.1°F

## 2019-06-04 DIAGNOSIS — I739 Peripheral vascular disease, unspecified: Secondary | ICD-10-CM

## 2019-06-04 DIAGNOSIS — I96 Gangrene, not elsewhere classified: Secondary | ICD-10-CM

## 2019-06-04 DIAGNOSIS — L97411 Non-pressure chronic ulcer of right heel and midfoot limited to breakdown of skin: Secondary | ICD-10-CM | POA: Diagnosis not present

## 2019-06-04 NOTE — Telephone Encounter (Signed)
Ok. Will wait for her response.

## 2019-06-04 NOTE — Progress Notes (Addendum)
Subjective: Alexis [name] presents the office today for Pap evaluation of gangrene to her right hallux as well as a wound to the right heel.  She still on doxycycline and she denies any drainage or pus and denies any swelling or redness to her foot.  She also states that she is not happy with the way the nurses been changing the bandage.  The dressing gets stuck onto the wound.Denies any systemic complaints such as fevers, chills, nausea, vomiting. No acute changes since last appointment, and no other complaints at this time.   Objective: AAO x3, NAD Neurovascular status unchanged.  Overall the color to the foot is doing much better prior to intervention.  Gangrenous changes present the distal aspect of the hallux which is dry from the medial aspect the eschar has come off some and there was a small amount of drainage underneath that area.  This area measures approximately 1.2 x 0.6 cm with a depth of 0.1 and is full-thickness.  Minimal drainage present.  There is no edema, erythema.  No ascending cellulitis.  No fluctuation crepitation.  The wound to the medial heel has almost healed. No open lesions or pre-ulcerative lesions.  No pain with calf compression, swelling, warmth, erythema  Assessment: 69 year old female with gangrene/ulceration to the right hallux/heel  Plan: -All treatment options discussed with the patient including all alternatives, risks, complications.  -I debrided some the loose tissue off the medial right hallux. Will switch to Mepilex. We applied this today and also ordered this today through [facility].  -Finish course of antibiotics -Monitor for any clinical signs or symptoms of infection and directed to call the office immediately should any occur or go to the ER.   Trula Slade DPM      Some of the drainage but after debridement there is no further drainage or purulence -Patient encouraged to call the office with any questions, concerns, change in symptoms.

## 2019-06-04 NOTE — Telephone Encounter (Signed)
I have made two attempts and have been unable to reach patient. I have mailed the patient a letter requesting they call the office to schedule a hospital follow up appointment.   

## 2019-06-05 ENCOUNTER — Encounter: Payer: Self-pay | Admitting: Podiatry

## 2019-06-05 ENCOUNTER — Other Ambulatory Visit: Payer: Self-pay | Admitting: Cardiology

## 2019-06-05 ENCOUNTER — Telehealth: Payer: Self-pay | Admitting: *Deleted

## 2019-06-05 ENCOUNTER — Other Ambulatory Visit: Payer: Self-pay | Admitting: Pharmacist

## 2019-06-05 ENCOUNTER — Encounter: Payer: Self-pay | Admitting: Medical

## 2019-06-05 NOTE — Telephone Encounter (Signed)
Spoke with Debbie at  [facility] gave verbal order per  Valerie's note that she is to apply a small amount of betadine to the wounds followed by a nonstick dressing and then dry dressing until the Mepilex comes and to disregard the betadine and to clean with wound cleaner or saline before applying dressing. She verbalized understanding and said ok.

## 2019-06-05 NOTE — Telephone Encounter (Signed)
Dr. Jacqualyn Posey ordered can do mepilex dressing to the hallux and the heel wound daily. Until then they can keep the areas clean and dry. Apply a small amount of betadine to the wounds followed by a nonstick dressing and then a dry dressing. Orders faxed to [facility].

## 2019-06-05 NOTE — Patient Outreach (Addendum)
[place] [facility])  [place] Team    06/05/2019  Alexis [name] [birth year] [id number]  Reason for referral: Medication Review  Referral source: [employer] C-SNP Care Manager with [facility] Current insurance: [employer] C-SNP  PMHx includes but not limited to:   Patient is a 69 year old femalewith medical history significant for:  Type 2 diabetes,hypertension,atrial fibrillation,coronary artery disease and peripheral arterial disease status post amputation of the right second toe. She was recently hospitalized for cellulitis in diabetic foot.  Outreach:  Successful telephone call with patient.  HIPAA identifiers verified.   Subjective:   Does the patient ever forget to take medication?  no Does the patient have problems obtaining medications due to transportation?   no Does the patient have problems obtaining medications due to cost?  no   Does the patient feel that medications prescribed are effective?  no Does the patient ever experience any side effects to the medications prescribed?  no  Does the patient measure his/her own blood glucose at home?  Yes- 140 mg/dl this morning  Does the patient measure his/her own blood pressure at home? Yes   Objective: The ASCVD Risk score Mikey Bussing DC Jr., et al., 2013) failed to calculate for the following reasons:   The patient has a prior MI or stroke diagnosis  Lab Results  Component Value Date   CREATININE 0.64 05/27/2019   CREATININE 1.10 (H) 05/25/2019   CREATININE 0.96 05/23/2019    Lab Results  Component Value Date   HGBA1C 7.7 (H) 05/26/2019    Lipid Panel     Component Value Date/Time   CHOL 93 (L) 05/10/2018 0944   TRIG 147 05/10/2018 0944   HDL 31 (L) 05/10/2018 0944   CHOLHDL 3.0 05/10/2018 0944   CHOLHDL 6 04/10/2015 1040   VLDL 50.8 (H) 04/10/2015 1040   LDLCALC 33 05/10/2018 0944   LDLDIRECT 111.0 04/10/2015 1040    BP Readings from Last 3 Encounters:   05/29/19 126/60  05/14/19 (!) 107/56  05/04/19 (!) 107/49    Allergies  Allergen Reactions  . Indomethacin Other (See Comments)    Renal Insufficiency  . Pregabalin Other (See Comments)    DIZZINESS   . Sulfa Antibiotics Hives  . Morphine And Related Nausea And Vomiting    Medications Reviewed Today    Reviewed by Elayne Guerin, [facility] (Pharmacist) on 06/05/19 at 1332  Med List Status: <None>  Medication Order Taking? Sig Documenting Provider Last Dose Status Informant  albuterol (VENTOLIN HFA) 108 (90 Base) MCG/ACT inhaler [id number] Yes Inhale 2 puffs into the lungs every 6 (six) hours as needed for wheezing or shortness of breath. [provider] Taking Active Self  allopurinol (ZYLOPRIM) 100 MG tablet [id number] Yes TAKE ONE TABLET BY MOUTH TWICE DAILY  Patient taking differently: 100 mg 2 (two) times daily.    Saguier, Percell Miller, PA-C Taking Active Self  AMBULATORY NON [facility] MEDICATION [id number] Yes Motorized scooter.  Diagnosis: Osteoarthritis M19.90 Ann Held, DO Taking Active Self           Med Note Elayne Guerin   Tue Jun 05, 2019  1:28 PM) Reports her scooter and has some mechanical issues and a technician is scheduled to come and look at it this week.  atorvastatin (LIPITOR) 10 MG tablet [id number] Yes Take 1 tablet (10 mg total) by mouth daily. Angelia Mould, MD Taking Active Self  clopidogrel (PLAVIX) 75 MG tablet [id number] Yes Take 1 tablet (75  mg total) by mouth daily. Waynetta Sandy, MD Taking Active Self  Continuous Blood Gluc Receiver (FREESTYLE LIBRE 14 DAY READER) [place] [id number] Yes Use as directed to monitor BG.  Dx E11.65 [provider] Taking Active Self  doxycycline (ADOXA) 100 MG tablet [id number] Yes Take 1 tablet (100 mg total) by mouth 2 (two) times daily. Georgette Shell, MD Taking Active   doxycycline (VIBRA-TABS) 100 MG tablet [id number] Yes Take 100 mg by mouth 2 (two) times daily. [provider] Taking Active   fluticasone (FLONASE) 50 MCG/ACT nasal spray [id number] Yes Place 2 sprays into both nostrils at bedtime.  Patient taking differently: Place 2 sprays into both nostrils 2 (two) times daily.    Alvia Grove, PA-C Taking Active Self  furosemide (LASIX) 20 MG tablet [id number] Yes Take 2 tables in the morning and 1 tablet at noon  Patient taking differently: Take 20-40 mg by mouth See admin instructions. Take 40 mg in the morning and 20 mg in the afternoon   Park Liter, MD Taking Active Self  Insulin Aspart FlexPen 100 UNIT/ML SOPN [id number] Yes Inject 0-10 Units into the skin 3 (three) times daily with meals. [provider] Taking Active Self           Med Note Hali Marry, Clarise Cruz E   Sat May 26, 2019  5:48 PM) Pt uses sliding scale, unable to recall exact scale  insulin degludec (TRESIBA) 100 UNIT/ML FlexTouch Pen [id number] Yes Inject 10 Units into the skin daily.  [provider] Taking Active Self  Insulin Pen Needle (PEN NEEDLES 31GX5/16") 31G X 8 MM MISC [id number] Yes Use as needed 4 times/day.  Dx E11.65 [provider] Taking Active Self  levocetirizine (XYZAL) 5 MG tablet [id number] Yes Take 5 mg by mouth at bedtime. [provider] Taking Active Self  levothyroxine (SYNTHROID) 150 MCG tablet [id number] Yes Take 150 mcg by mouth every morning. [provider] Taking Active Self  Lidocaine HCl 4 % LIQD [id number] Yes Apply 10 mLs topically 2 (two) times daily.  Patient taking differently: Apply 10 mLs topically 2 (two) times daily as needed (pain).    Saguier, Percell Miller, PA-C Taking Active Self  losartan (COZAAR) 25 MG tablet [id number] Yes TAKE ONE (1) TABLET BY MOUTH EVERY DAY  Patient taking differently: Take 25 mg by mouth daily.    Park Liter, MD Taking Active Self  metoprolol succinate (TOPROL-XL) 25 MG 24 hr tablet [id number] Yes TAKE ONE (1) TABLET BY MOUTH EACH DAY  Patient taking differently: Take 25 mg by  mouth daily.    Saguier, Percell Miller, PA-C Taking Active Self  ondansetron (ZOFRAN-ODT) 4 MG disintegrating tablet [id number] Yes Take 4 mg by mouth every 8 (eight) hours as needed for nausea or vomiting.  [provider] Taking Active Self  Prenatal Vit-Fe Fumarate-FA (MULTIVITAMIN-PRENATAL) 27-0.8 MG TABS tablet [id number] Yes Take 1 tablet by mouth daily.  [provider] Taking Active Self  Probiotic CAPS [id number] Yes Take 2 capsules by mouth daily. [provider] Taking Active Self  psyllium (HYDROCIL/METAMUCIL) 95 % PACK [id number] No Take 1 packet by mouth daily.  Patient not taking: Reported on 06/05/2019   Georgette Shell, MD Not Taking Active   RABEprazole (ACIPHEX) 20 MG tablet [id number] Yes Take 1 tablet (20 mg total) by mouth daily. Jackquline Denmark, MD Taking Active Self  spironolactone (ALDACTONE) 50 MG tablet [id number] Yes Take 1 tablet (50 mg total) by mouth daily. Jackquline Denmark, MD Taking  Active Self          Assessment: ASSESSMENT: Date Discharged from Hospital: 05/29/2019 Date Medication Reconciliation Performed: 06/05/2019  Patient was recently discharged from hospital and all medications have been reviewed:  START taking: doxycycline (ADOXA)  CHANGE how you take: levothyroxine (SYNTHROID) psyllium (HYDROCIL/METAMUCIL)  STOP taking: busPIRone 7.5 MG tablet (BUSPAR) dicloxacillin 500 MG capsule (DYNAPEN) doxycycline 100 MG tablet (VIBRA-TABS)  K-3.5 (05/27/2019)  HgA1c-7.7%  On statin-Atorvastatin last filled 05/28/2019 for a 30 day supply  Medication Assistance Findings:  No medication assistance needs identified   Plan: . Will close [facility] pharmacy case as no further medication needs identified at this time.  Am happy to assist in the future as needed.    . Patient's case is being closed as the [place] Team has transitioned to the Quality Department and will no longer document in [facility].  Elayne Guerin, PharmD, [place] Clinical  Pharmacist [phone]

## 2019-06-06 ENCOUNTER — Telehealth: Payer: Self-pay | Admitting: Medical

## 2019-06-06 ENCOUNTER — Encounter: Payer: Self-pay | Admitting: Medical

## 2019-06-06 ENCOUNTER — Ambulatory Visit (INDEPENDENT_AMBULATORY_CARE_PROVIDER_SITE_OTHER): Payer: HMO | Admitting: Medical

## 2019-06-06 ENCOUNTER — Other Ambulatory Visit: Payer: Self-pay

## 2019-06-06 VITALS — BP 140/93 | HR 60 | Ht 62.0 in | Wt 191.0 lb

## 2019-06-06 DIAGNOSIS — E118 Type 2 diabetes mellitus with unspecified complications: Secondary | ICD-10-CM

## 2019-06-06 DIAGNOSIS — I10 Essential (primary) hypertension: Secondary | ICD-10-CM | POA: Diagnosis not present

## 2019-06-06 DIAGNOSIS — F32A Depression, unspecified: Secondary | ICD-10-CM

## 2019-06-06 DIAGNOSIS — F329 Major depressive disorder, single episode, unspecified: Secondary | ICD-10-CM

## 2019-06-06 DIAGNOSIS — I96 Gangrene, not elsewhere classified: Secondary | ICD-10-CM | POA: Diagnosis not present

## 2019-06-06 DIAGNOSIS — I509 Heart failure, unspecified: Secondary | ICD-10-CM

## 2019-06-06 MED ORDER — SERTRALINE HCL 50 MG PO TABS: 50.0000 mg | ORAL_TABLET | Freq: Every day | ORAL | 3 refills | Status: DC

## 2019-06-06 NOTE — Patient Instructions (Addendum)
Glad to hear that you are doing well post hospitalization.  History of CHF but recent chest x-ray was negative and recent BNP was not elevated.  Continue to monitor weight daily and continue current diuretics.  If rapid weight gain, dyspnea or increase of pedal edema notify us.  Recently sugar levels in the low 100s.  Continue current diabetic med regimen and eat low sugar diet.  Follow-up with endocrinologist next month.  Treated for gangrene of heel with IV antibiotics in the hospital.  Continue doxycycline tablets and follow advice of podiatrist on seeing this Monday.  You have 1 small superficial abdomen wound that persist.  I put in referral for wound care in February.  I would asked that you contact in [facility] and have them send over order to continue wound care.  I will sign the order and fax it back will give verbal.  If they need a new referral I will place that as well.   For anxiety and depression recommend you restart sertraline and discontinue BuSpar due to recent side effect with BuSpar.  Follow-up in 3 weeks or as needed.

## 2019-06-06 NOTE — Progress Notes (Signed)
Subjective:    Patient ID: Alexis [name], female    DOB: May 13, 1950, 69 y.o.   MRN: [id number]  HPI   Virtual Visit via Video Note  I connected with Alexis [name] on 06/06/19 at 11:20 AM EDT by a video enabled telemedicine application and verified that I am speaking with the correct person using two identifiers.  Location: Patient: home Provider: office   I discussed the limitations of evaluation and management by telemedicine and the availability of in person appointments. The patient expressed understanding and agreed to proceed.  History of Present Illness: Pt in for follow up.  She did get cxr since last visit.   Admitted From: Home \ disposition: Home Recommendations for Outpatient Follow-up:  1. Follow up with PCP in 1-2 weeks 2. Please obtain BMP/CBC in one week 3. Please follow up with Dr. Jacqualyn Posey as an outpatient  [place]: Yes  equipment/Devices: Offloading shoes Discharge Condition: Stable and improved CODE STATUS full code Diet recommendation: Carb modified heart healthy diet Brief/Interim Summary:68 y.o.femalewith medical history significant forinsulin-dependent diabetes mellitus,hypertension,atrial fibrillation,coronary artery disease and peripheral arterial disease status post amputation of the right second toe who was transferred from the ER at [facility] for treatment of cellulitis involvingthe right foot and diabetic foot infection involving the right great toe.Patient has been seen by podiatry and is status post debridement of the right great toe.She states that she was supposed to start antibiotic,doxycycline but that it was not called in.She developed a fever and redness involving the right foot presented to the emergency room.Her T-max was 99.48F in the ER but per patient it was 100.32F at home.Denies having any cough,no chest pain,no shortness of breath,no dizziness or lightheadedness.Denies having any abdominal pain,no  nausea,no vomiting or diarrhea.Has no urinary symptoms She had an x-ray of the right foot which showedno acute displaced fracture or dislocation. No radiographic evidence for osteomyelitis  ED Course:Patient presented to the emergency room for evaluation of a fever.Patient's vital signs not meeting strict sepsis criteria. But a little bit of concern for perhaps a preseptic picture. With her heart rate up some. Never hypotensive. Initially had 2 blood pressures when out in triage that were low. But since she was been back in the room her initial one was 295 systolic. And they have remained in that ballpark. Heart rates been in the low 100s. Covid testing is pending. Patient started on broad-spectrum antibiotics initially Zosyn for diabetic cellulitis. And vancomycin was added. Patient without any chest pain. But initial troponin was 31. Repeat 2-hour troponin was 33 so no significant delta change. Patient without a leukocytosis. Labs as far as electrolytes unchanged from baseline. Chest x-ray was negative. Patient EKG had a little bit of subtle ST changes but nothing acute. And again patient's had no chest pain I think with 2 - troponins unlikely that there is an acute cardiac component.  Patient will beadmittedto telemetry. As this could be early sepsis. In addition x-ray of the right foot showed no bony problems or any evidence of gas in the soft tissue.  Since last hospitalization she has followed up with podiatrist this Monday.  Assessment: 69 year old female with gangrene/ulceration to the right hallux/heel  Plan: -All treatment options discussed with the patient including all alternatives, risks, complications.  -I debrided some the loose tissue off the medial right hallux. Will switch to Mepilex. We applied this today and also ordered this today through [facility].  -Finish course of antibiotics -Monitor for any clinical signs or symptoms  of infection and directed to  call the office immediately should any occur or go to the ER.  Pt states her weight has been stable since dc from hospital. No sob/dysnea.  Pt sugar have been in lower 100 recently. Pt sees endocrinologist next month.  Pt had iv antibiotics for 36 hours for wound infection. On DC gave her doxycyline antibiotic.  Pt also has history of breakdown on her stomach. She had various open area in past. Most recently only has one open area. Pt needs referral for [facility].  Pt wants to restart sertraline for anxiety and depression. Recently buspar made her hyperactive and had insomnia.     Observations/Objective: General-no acute distress, pleasant, oriented. Lungs- on inspection lungs appear unlabored. Neck- no tracheal deviation or jvd on inspection. Neuro- gross motor function appears intact.  Assessment and Plan: Glad to hear that you are doing well post hospitalization.  History of CHF but recent chest x-ray was negative and recent BNP was not elevated.  Continue to monitor weight daily and continue current diuretics.  If rapid weight gain, dyspnea or increase of pedal edema notify us.  Recently sugar levels in the low 100s.  Continue current diabetic med regimen and eat low sugar diet.  Follow-up with endocrinologist next month.  Treated for gangrene of heel with IV antibiotics in the hospital.  Continue doxycycline tablets and follow advice of podiatrist on seeing this Monday.  You have 1 small superficial abdomen wound that persist.  I put in referral for wound care in February.  I would asked that you contact in [facility] and have them send over order to continue wound care.  I will sign the order and fax it back will give verbal.  If they need a new referral I will place that as well.   For anxiety and depression recommend you restart sertraline and discontinue BuSpar due to recent side effect with BuSpar.  Follow-up in 3 weeks or as needed.  Mackie Pai, PA-C   Time spent  with patient today was  30  minutes which consisted of chart review, discussing diagnosis,  treatment plans  and documentation.  Follow Up Instructions:    I discussed the assessment and treatment plan with the patient. The patient was provided an opportunity to ask questions and all were answered. The patient agreed with the plan and demonstrated an understanding of the instructions.   The patient was advised to call back or seek an in-person evaluation if the symptoms worsen or if the condition fails to improve as anticipated.     Mackie Pai, PA-C   Review of Systems     Objective:   Physical Exam        Assessment & Plan:

## 2019-06-06 NOTE — Telephone Encounter (Signed)
They misunderstood. I told pt that I would approve wound care thru wound care nurse/specialist thru home health. They had treated this in this  past February. So I want them to continue with wound care. I don't have specific recommendations. I want them wound care nurse to evaluate and follow her protocols.   I just want them to re-initiate care ordered in February. I don't want to put in another referral but will if that should be next step.

## 2019-06-06 NOTE — Telephone Encounter (Signed)
PER Nikkie with [facility]. Patient states that she has a rash/wound that she discuss with edward. Percell Miller states that he will gives orders on how to treat wound/rash.   Please fax [phone] order over .

## 2019-06-07 ENCOUNTER — Ambulatory Visit: Payer: Self-pay | Admitting: *Deleted

## 2019-06-07 ENCOUNTER — Telehealth: Payer: Self-pay | Admitting: *Deleted

## 2019-06-07 ENCOUNTER — Encounter: Payer: Self-pay | Admitting: Medical

## 2019-06-07 NOTE — Telephone Encounter (Signed)
Spoke with Anderson Malta about continuing wound care orders from [place].

## 2019-06-07 NOTE — Telephone Encounter (Signed)
[facility] - Alexis [name] states they have received supplies order without frequency, please fax to [phone].

## 2019-06-08 ENCOUNTER — Other Ambulatory Visit: Payer: Self-pay | Admitting: Cardiology

## 2019-06-08 DIAGNOSIS — L97519 Non-pressure chronic ulcer of other part of right foot with unspecified severity: Secondary | ICD-10-CM | POA: Diagnosis not present

## 2019-06-08 NOTE — Telephone Encounter (Signed)
Called [facility] and spoke with Jinny Blossom and gave the frequency 15 days and changing the dressing every day. Lattie Haw

## 2019-06-09 ENCOUNTER — Encounter: Payer: Self-pay | Admitting: Podiatry

## 2019-06-11 ENCOUNTER — Encounter: Payer: Self-pay | Admitting: Gastroenterology

## 2019-06-11 ENCOUNTER — Telehealth (INDEPENDENT_AMBULATORY_CARE_PROVIDER_SITE_OTHER): Payer: HMO | Admitting: Gastroenterology

## 2019-06-11 ENCOUNTER — Other Ambulatory Visit: Payer: Self-pay

## 2019-06-11 VITALS — Ht 62.0 in | Wt 187.0 lb

## 2019-06-11 DIAGNOSIS — Z01818 Encounter for other preprocedural examination: Secondary | ICD-10-CM

## 2019-06-11 DIAGNOSIS — I8500 Esophageal varices without bleeding: Secondary | ICD-10-CM

## 2019-06-11 DIAGNOSIS — K219 Gastro-esophageal reflux disease without esophagitis: Secondary | ICD-10-CM | POA: Diagnosis not present

## 2019-06-11 DIAGNOSIS — K7460 Unspecified cirrhosis of liver: Secondary | ICD-10-CM

## 2019-06-11 NOTE — Patient Instructions (Signed)
If you are age 71 or older, your body mass index should be between 23-30. Your Body mass index is 34.2 kg/m. If this is out of the aforementioned range listed, please consider follow up with your Primary Care Provider.  If you are age 67 or younger, your body mass index should be between 19-25. Your Body mass index is 34.2 kg/m. If this is out of the aformentioned range listed, please consider follow up with your Primary Care Provider.   You have been scheduled for an endoscopy. Please follow written instructions given to you at your visit today. If you use inhalers (even only as needed), please bring them with you on the day of your procedure. Your physician has requested that you go to www.startemmi.com and enter the access code given to you at your visit today. This web site gives a general overview about your procedure. However, you should still follow specific instructions given to you by our office regarding your preparation for the procedure.  You have been scheduled for an abdominal ultrasound at [place] on 07/16/19 at 11am. Please arrive 15 minutes prior to your appointment for registration. Make certain not to have anything to eat or drink 6 hours prior to your appointment. Should you need to reschedule your appointment, please contact radiology at [phone]. This test typically takes about 30 minutes to perform.   Please go to the lab at [facility] ([place] You will need to go to level "B", you do not need an appointment for this. Hours available are 7:30 am - 4:30 pm.   Thank you,  Dr. Jackquline Denmark

## 2019-06-11 NOTE — Progress Notes (Signed)
Chief Complaint: FU  Referring Provider:  Mackie Pai, PA-C      ASSESSMENT AND PLAN;   #1.  Liver cirrhosis d/t  R sided CHF, NASH. R/O other etiologies (no ETOH x 20 yrs). Dx on CT 08/2018, with portal hypertension in form of splenomegaly with hypersplenism, esophageal varices, pancytopenia, mild ascites. No definite clinical HE.  MELD Na 13 (11/2018). Neg AI screen. Immune to A, getting hep B vaccine  #2. Esophageal varices (Gd III) s/p EVL 11/2018, 05/01/2019.   #3. GERD with small transient HH.  #4. Comorbid conditions include CHF, PVD, HTN, A. fib, HLD, PAD, DM 2, CAD, pacemaker and obesity.   Plan:  - EGD with rpt EVL (if needed) at [facility]. - [place] complete 06/2019 - Check CBC, CMP, PT INR, AFP, ammonia at time of [place] - Continue lasix 88m/aldactone 531mpo qd. - Salt restricted diet. - [facility]     HPI:    Alexis SWEIGERTs a [age].o. female  With newly diagnosed liver cirrhosis on CT 08/2018  For FU.  Feels great since she has stopped Cymbalta.  Underwent EGD and colonoscopy December 25, 2018.  EGD showed grade 3 esophageal varices s/p EVL, portal hypertensive gastropathy.  Colonoscopy 11/2018 showed mild portal hypertensive colopathy, grade 4 internal hemorrhoids, mild pancolonic diverticulosis.  She had some problems with epigastric pain and dysphagia after endoscopic variceal ligation.  She took Jell-O for 2 to 3 days thereafter.  Currently better except for occasional epigastric discomfort, regurgitation and heartburn despite AcipHex once a day.  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  After that she did not drink any alcohol.  She is alcohol free for last 20 years.  To me she denies having any history of hematochezia.  Has more constipation but is better on Metamucil.  No change in mental status.  Has history of easy bruisability.  No history of itching, skin lesions, easy  bruisability, intake of over-the-counter medications including diet pills, herbal medications, anabolic steroids or Tylenol.  There is no history of blood transfusions, IV drug use or family history of liver disease.  No jaundice dark urine or pale stools.  Recently seen by cardiology and vascular surgery as well.  Has chronic mild shortness of breath.  No nonsteroidals.  Wt 187lb   2DE 09/2018: (Dr K)Raliegh Ip1. The right ventricle has moderately reduced systolic function. The cavity was mildly enlarged. 2. Right atrial size was severely dilated. 3. Small pericardial effusion. 4. EF 40%  CT AP with contrast 09/28/2018: 1. Sigmoid diverticulosis. 2.  Stigmata of cirrhosis and splenomegaly (15.6 cm).  Trace ascites. 3. Gallstone near the gallbladder neck  Past Medical History:  Diagnosis Date  . Allergy   . Anxiety   . Arthritis   . Asthma   . Atrial fibrillation (H[place]  . CHF (congestive heart failure) (HCLiberty  . Cirrhosis (HCBallou  . COPD (chronic obstructive pulmonary disease) (HCTrigg  . Depression 05/26/2019  . Diabetes mellitus without complication (HCWray  . Hyperlipidemia   . Hypertension   . Macrocytic anemia 09/04/2018  . Myocardial infarction (HCBurt   several  . Pacemaker    CRT-P with RV lead and His Bundle lead ( high threshold)   . Peripheral neuropathy   . Pneumonia   . PONV (postoperative nausea and vomiting)    unsure of exactly what happened at  [facility] in 2010 (knee surgery) that led to crital care  . Thyroid disease     Past Surgical History:  Procedure Laterality Date  . ABDOMINAL AORTOGRAM W/LOWER EXTREMITY N/A 04/29/2016   Procedure: Abdominal Aortogram w/Lower Extremity;  Surgeon: Waynetta Sandy, MD;  Location: [place] [facility];  Service: Cardiovascular;  Laterality: N/A;  . ABDOMINAL AORTOGRAM W/LOWER EXTREMITY N/A 05/06/2017   Procedure: ABDOMINAL AORTOGRAM W/LOWER EXTREMITY;  Surgeon: Angelia Mould, MD;  Location: [place] [facility]  [facility];  Service: Cardiovascular;  Laterality: N/A;  bilateral  . ABDOMINAL AORTOGRAM W/LOWER EXTREMITY Bilateral 05/04/2019   Procedure: ABDOMINAL AORTOGRAM W/LOWER EXTREMITY;  Surgeon: Angelia Mould, MD;  Location: [place] [facility];  Service: Cardiovascular;  Laterality: Bilateral;  . ABDOMINAL HYSTERECTOMY    . AMPUTATION Right 07/30/2016   Procedure: AMPUTATION RIGHT SECOND TOE;  Surgeon: Angelia Mould, MD;  Location: [place];  Service: Vascular;  Laterality: Right;  . CARDIAC CATHETERIZATION     2005 at [place] N/A 12/25/2018   Procedure: COLONOSCOPY WITH PROPOFOL;  Surgeon: Jackquline Denmark, MD;  Location: [facility];  Service: Endoscopy;  Laterality: N/A;  . ESOPHAGEAL BANDING  12/25/2018   Procedure: ESOPHAGEAL BANDING;  Surgeon: Jackquline Denmark, MD;  Location: [facility];  Service: Endoscopy;;  . ESOPHAGEAL BANDING  05/01/2019   Procedure: ESOPHAGEAL BANDING;  Surgeon: Jackquline Denmark, MD;  Location: [facility];  Service: Endoscopy;;  . ESOPHAGOGASTRODUODENOSCOPY (EGD) WITH PROPOFOL N/A 12/25/2018   Procedure: ESOPHAGOGASTRODUODENOSCOPY (EGD) WITH PROPOFOL;  Surgeon: Jackquline Denmark, MD;  Location: [facility];  Service: Endoscopy;  Laterality: N/A;  . ESOPHAGOGASTRODUODENOSCOPY (EGD) WITH PROPOFOL N/A 05/01/2019   Procedure: ESOPHAGOGASTRODUODENOSCOPY (EGD) WITH PROPOFOL;  Surgeon: Jackquline Denmark, MD;  Location: [facility];  Service: Endoscopy;  Laterality: N/A;  . LOWER EXTREMITY ANGIOGRAPHY Right 05/14/2019   Procedure: LOWER EXTREMITY ANGIOGRAPHY;  Surgeon: Waynetta Sandy, MD;  Location: [place] [facility];  Service: Cardiovascular;  Laterality: Right;  . PERIPHERAL VASCULAR ATHERECTOMY Right 04/29/2016   Procedure: Peripheral Vascular Atherectomy-Right Popliteal;  Surgeon: Waynetta Sandy, MD;  Location: [place] [facility];  Service: Cardiovascular;  Laterality: Right;  . PERIPHERAL VASCULAR ATHERECTOMY Right  05/14/2019   Procedure: PERIPHERAL VASCULAR ATHERECTOMY;  Surgeon: Waynetta Sandy, MD;  Location: [place] [facility];  Service: Cardiovascular;  Laterality: Right;  right SFA/pop  . PERIPHERAL VASCULAR INTERVENTION Right 04/29/2016   Procedure: Peripheral Vascular Intervention-Right Popliteal;  Surgeon: Waynetta Sandy, MD;  Location: [place] [facility];  Service: Cardiovascular;  Laterality: Right;  POPLITEAL PTA  . PERIPHERAL VASCULAR INTERVENTION Right 05/14/2019   Procedure: PERIPHERAL VASCULAR INTERVENTION;  Surgeon: Waynetta Sandy, MD;  Location: [place] [facility];  Service: Cardiovascular;  Laterality: Right;  popliteal stent  . RADIOFREQUENCY ABLATION    . TOTAL KNEE ARTHROPLASTY    . TUBAL LIGATION      Family History  Problem Relation Age of Onset  . Diabetes Mother   . Hyperlipidemia Mother   . Diabetes Father   . Hyperlipidemia Father   . Heart disease Father        before age 51  . Heart attack Father   . Diabetes Brother   . Hyperlipidemia Brother   . Heart attack Brother     Social History   Tobacco Use  . Smoking status: Former Smoker    Quit date: 03/01/1998    Years since quitting: 21.2  . Smokeless tobacco: Never Used  Substance  Use Topics  . 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  . Drug use: Yes    Types: Marijuana    Comment: remote use    Current Outpatient Medications  Medication Sig Dispense Refill  . albuterol (VENTOLIN HFA) 108 (90 Base) MCG/ACT inhaler Inhale 2 puffs into the lungs every 6 (six) hours as needed for wheezing or shortness of breath.    . allopurinol (ZYLOPRIM) 100 MG tablet TAKE ONE TABLET BY MOUTH TWICE DAILY (Patient taking differently: 100 mg 2 (two) times daily. ) 60 tablet 3  . AMBULATORY NON FORMULARY MEDICATION Motorized scooter.  Diagnosis: Osteoarthritis M19.90 1 each 0  . atorvastatin (LIPITOR) 10 MG tablet Take 1  tablet (10 mg total) by mouth daily. 30 tablet 11  . clopidogrel (PLAVIX) 75 MG tablet Take 1 tablet (75 mg total) by mouth daily. 30 tablet 11  . Continuous Blood Gluc Receiver (FREESTYLE LIBRE 14 DAY READER) DEVI Use as directed to monitor BG.  Dx E11.65    . doxycycline (ADOXA) 100 MG tablet Take 1 tablet (100 mg total) by mouth 2 (two) times daily. 20 tablet 1  . fluticasone (FLONASE) 50 MCG/ACT nasal spray Place 2 sprays into both nostrils at bedtime. (Patient taking differently: Place 2 sprays into both nostrils 2 (two) times daily. )    . furosemide (LASIX) 20 MG tablet Take 1-2 tablets (20-40 mg total) by mouth See admin instructions. Take 40 mg in the morning and 20 mg in the afternoon. PLEASE CALL OFFICE TO SCHEDULE AN APPOINTMENT 90 tablet 0  . Insulin Aspart FlexPen 100 UNIT/ML SOPN Inject 0-10 Units into the skin as needed. If glucose if over 200    . insulin degludec (TRESIBA) 100 UNIT/ML FlexTouch Pen Inject 10 Units into the skin daily.     . Insulin Pen Needle (PEN NEEDLES 31GX5/16") 31G X 8 MM MISC Use as needed 4 times/day.  Dx E11.65    . levocetirizine (XYZAL) 5 MG tablet Take 5 mg by mouth at bedtime.    Marland Kitchen levothyroxine (SYNTHROID) 150 MCG tablet Take 150 mcg by mouth every morning.    . Lidocaine HCl 4 % LIQD Apply 10 mLs topically 2 (two) times daily. (Patient taking differently: Apply 10 mLs topically 2 (two) times daily as needed (pain). ) 73 mL 0  . losartan (COZAAR) 25 MG tablet TAKE ONE (1) TABLET BY MOUTH EVERY DAY 30 tablet 0  . metoprolol succinate (TOPROL-XL) 25 MG 24 hr tablet TAKE ONE (1) TABLET BY MOUTH EACH DAY (Patient taking differently: Take 25 mg by mouth daily. ) 90 tablet 1  . ondansetron (ZOFRAN-ODT) 4 MG disintegrating tablet Take 4 mg by mouth every 8 (eight) hours as needed for nausea or vomiting.     . Prenatal Vit-Fe Fumarate-FA (MULTIVITAMIN-PRENATAL) 27-0.8 MG TABS tablet Take 1 tablet by mouth daily.     . Probiotic CAPS Take 2 capsules by mouth  daily. Does 1000 units    . RABEprazole (ACIPHEX) 20 MG tablet Take 1 tablet (20 mg total) by mouth daily. 30 tablet 1  . spironolactone (ALDACTONE) 50 MG tablet Take 1 tablet (50 mg total) by mouth daily. 30 tablet 11  . sertraline (ZOLOFT) 50 MG tablet Take 1 tablet (50 mg total) by mouth daily. (Patient not taking: Reported on 06/11/2019) 30 tablet 3   No current facility-administered medications for this visit.    Allergies  Allergen  Reactions  . Indomethacin Other (See Comments)    Renal Insufficiency  . Pregabalin Other (See Comments)    DIZZINESS   . Sulfa Antibiotics Hives  . Morphine And Related Nausea And Vomiting    Review of Systems:  Constitutional: Denies fever, chills, diaphoresis, appetite change and has fatigue.  HEENT: Denies photophobia, eye pain, redness, hearing loss, ear pain, congestion, sore throat, rhinorrhea, sneezing, mouth sores, neck pain, neck stiffness and tinnitus.   Respiratory: Has SOB, DOE, cough, No chest tightness/wheezing.   Cardiovascular: Denies chest pain, palpitations and leg swelling.  Genitourinary: Denies dysuria, urgency, frequency, hematuria, flank pain and difficulty urinating.  Musculoskeletal: Has myalgias, back pain, joint swelling, arthralgias and gait problem.  Skin: No rash.  Neurological: Denies dizziness, seizures, syncope, weakness, light-headedness, numbness and headaches.  Hematological: Denies adenopathy. Has Easy bruising. Psychiatric/Behavioral: Has anxiety or depression     Physical Exam:    Ht 5' 2"  (1.575 m)   Wt 187 lb (84.8 kg)   BMI 34.20 kg/m  Filed Weights   06/11/19 0954  Weight: 187 lb (84.8 kg)   Constitutional:  Well-developed, in no acute distress. Psychiatric: Normal mood and affect. Behavior is normal. HEENT: Pupils normal.  Conjunctivae are normal. No scleral icterus. Neck supple.  Cardiovascular: Normal rate, regular rhythm. No edema Pulmonary/chest: Effort normal and breath sounds-decreased.  no wheezing, rales or rhonchi. Abdominal: Soft, nondistended. Nontender. Bowel sounds active throughout. There are no masses palpable. No hepatomegaly. Rectal:  defered Neurological: Alert and oriented to person place and time. Skin: Skin is warm and dry. No rashes noted.  Data Reviewed: I have personally reviewed following labs and imaging studies  CBC: CBC Latest Ref Rng & Units 05/27/2019 05/25/2019 05/14/2019  WBC 4.0 - 10.5 K/uL 5.0 6.1 -  Hemoglobin 12.0 - 15.0 g/dL 12.7 14.2 15.0  Hematocrit 36.0 - 46.0 % 38.1 42.1 44.0  Platelets 150 - 400 K/uL 62(L) 71(L) -    CMP: CMP Latest Ref Rng & Units 05/27/2019 05/25/2019 05/23/2019  Glucose 70 - 99 mg/dL 168(H) 173(H) 175(H)  BUN 8 - 23 mg/dL 30(H) 46(H) 55(H)  Creatinine 0.44 - 1.00 mg/dL 0.64 1.10(H) 0.96  Sodium 135 - 145 mmol/L 136 131(L) 128(L)  Potassium 3.5 - 5.1 mmol/L 3.5 4.0 4.0  Chloride 98 - 111 mmol/L 105 94(L) 91(L)  CO2 22 - 32 mmol/L 21(L) 25 30  Calcium 8.9 - 10.3 mg/dL 8.9 9.7 9.9  Total Protein 6.5 - 8.1 g/dL - 7.4 7.4  Total Bilirubin 0.3 - 1.2 mg/dL - 1.1 0.9  Alkaline Phos 38 - 126 U/L - 218(H) 256(H)  AST 15 - 41 U/L - 163(H) 78(H)  ALT 0 - 44 U/L - 135(H) 69(H)   Hepatic Function Latest Ref Rng & Units 05/25/2019 05/23/2019 04/12/2019  Total Protein 6.5 - 8.1 g/dL 7.4 7.4 7.8  Albumin 3.5 - 5.0 g/dL 3.7 4.0 4.1  AST 15 - 41 U/L 163(H) 78(H) 49(H)  ALT 0 - 44 U/L 135(H) 69(H) 34  Alk Phosphatase 38 - 126 U/L 218(H) 256(H) 172(H)  Total Bilirubin 0.3 - 1.2 mg/dL 1.1 0.9 0.9  Bilirubin, Direct 0.00 - 0.40 mg/dL - - -       Radiology Studies: PERIPHERAL VASCULAR CATHETERIZATION  Result Date: 05/14/2019 Patient name: BLEU MOISAN MRN: [id number] DOB: Mar 08, 1950 Sex: female 05/14/2019 Pre-operative Diagnosis: Critical right lower extremity ischemia Post-operative diagnosis:  Same Surgeon:  Erlene Quan C. Donzetta Matters, MD Procedure Performed: 1.  Ultrasound-guided cannulation left common femoral artery 2.  Right  lower  extremity angiogram 3.  Ultrasound-guided cannulation right posterior tibial artery 4.  Laser atherectomy with 1.5 mm catheter of right SFA and popliteal arteries 5.  Stent of right popliteal artery with 5 x 60 mm [place] 6.  Minx device closure left common femoral artery 7.  Moderate sedation with fentanyl and Versed 79 minutes Indications: 69 year old female with gangrenous changes to right great toe.  She is undergone angiography no intervention was undertaken.  She is now indicated for repeat angiography possible intervention from a tibial access site. Findings: SFA had 16 cm occlusive lesion extending down into the at the knee popliteal artery.  After laser atherectomy we had a channel with one area of dissection this was stented.  We ballooned the stent as well as the SFA with 4 mm balloon.  She then had brisk flow to the foot via the peroneal.  The anterior tibial artery was occluded after the takeoff did reconstitute.  We did not demonstrate flow via the posterior tibial artery has had a wire and sheath in place completion but I suspect there is good flow there as well.  Where previously there was a 16 cm lesion there is now 0% residual stenosis.  Procedure:  The patient was identified in the holding area and taken to room 8.  The patient was then placed supine on the table and prepped and draped in the usual sterile fashion.  A time out was called.  Ultrasound was used to evaluate the left common femoral artery.  There was significant hematoma there.  The areas anesthetized 1% lidocaine cannulated with micropuncture needle followed the wire sheath.  And images saved the permanent record.  We placed a 5 French sheath followed by Bentson wire we crossed the bifurcation with Omni catheter and Glidewire advantage.  We then placed a long 6 French sheath patient was fully heparinized.  We used a bare catheter to get the lower advantage into the SFA.  We then used a quick cross catheter.  We could not cross  antegrade.  We used ultrasound to identify the posterior tibial artery.  This was large somewhat calcified.  There is anesthetized 1% lidocaine.  We cannulated posterior tibial artery at the ankle with micropuncture needle followed wire and sheath.  Images saved the permanent record.  We used a CXI catheter and V 18 wire to traverse the lesion retrograde.  We then confirmed intraluminal access.  We exchanged for an 014 wire snare this through and through.  We then performed laser atherectomy of the SFA and popliteal occlusive segment.  This was then ballooned with 3.5 mm balloon.  Completion demonstrated dissection of the just above the knee joint.  This was primarily stented with [place] stent.  The entire segment was postdilated with 4 mm balloon at nominal pressure.  Completion demonstrated brisk flow through the peroneal artery.  The anterior tibial artery occluded but did reconstitute.  Posterior tibial appears to be patent however there is a wire and sheath in place.  There is much better filling of the foot than her previous imaging.  Satisfied we remove the wire sheath.  We exchanged over a stiffer wire for short 6 French sheath a minx device was deployed.  We remove the sheath at the ankle pressure was held till hemostasis obtained.  She tolerated seizure without any complication. Contrast: 75cc Brandon C. Donzetta Matters, MD [facility] Office: [phone] Pager: [phone]  [facility] Chest Port 1 View  Result Date: 05/25/2019 CLINICAL DATA:  Fever and chills EXAM: PORTABLE CHEST 1 VIEW COMPARISON:  November 08, 2018 FINDINGS: Again noted is mild cardiomegaly. A left-sided pacemaker seen with the lead tips in the right atrium right ventricle. There is prominence of the central pulmonary vasculature. No large airspace consolidation or pleural effusion. No acute osseous abnormality. IMPRESSION: Cardiomegaly and pulmonary vascular congestion. Electronically Signed   By: Prudencio Pair M.D.    On: 05/25/2019 19:24   DG Foot Complete Right  Result Date: 05/25/2019 CLINICAL DATA:  Infection. EXAM: RIGHT FOOT COMPLETE - 3+ VIEW COMPARISON:  None. FINDINGS: The patient is status post prior amputation through the proximal aspect of the proximal phalanx of the second digit. There is no convincing radiographic evidence for osteomyelitis. There is no acute displaced fracture or dislocation. There may be some mild soft tissue swelling without evidence for associated subcutaneous gas. Vascular calcifications are noted. There is a small plantar calcaneal spur. There is an Achilles tendon enthesophyte. IMPRESSION: 1. No acute displaced fracture or dislocation. No radiographic evidence for osteomyelitis. 2. Postsurgical changes as above. Electronically Signed   By: Constance Holster M.D.   On: 05/25/2019 19:25      Carmell Austria, MD 06/11/2019, 10:10 AM  Cc: Mackie Pai, PA-C

## 2019-06-11 NOTE — Telephone Encounter (Signed)
I called the patient back. She states she did get the right pads and she is doing well. She did notice some bloody drainage but denies any pus. No swelling or redness or warmth to the foot. Otherwise doing well. Advised her not to cut any dead skin off.

## 2019-06-12 ENCOUNTER — Encounter: Payer: Self-pay | Admitting: Podiatry

## 2019-06-12 ENCOUNTER — Ambulatory Visit: Payer: HMO | Admitting: Podiatry

## 2019-06-13 ENCOUNTER — Other Ambulatory Visit: Payer: Self-pay | Admitting: Medical

## 2019-06-13 ENCOUNTER — Other Ambulatory Visit: Payer: Self-pay | Admitting: Cardiology

## 2019-06-14 ENCOUNTER — Other Ambulatory Visit: Payer: Self-pay | Admitting: Podiatry

## 2019-06-14 ENCOUNTER — Ambulatory Visit: Payer: HMO

## 2019-06-14 ENCOUNTER — Telehealth: Payer: Self-pay | Admitting: Podiatry

## 2019-06-14 DIAGNOSIS — H2513 Age-related nuclear cataract, bilateral: Secondary | ICD-10-CM | POA: Diagnosis not present

## 2019-06-14 DIAGNOSIS — H52203 Unspecified astigmatism, bilateral: Secondary | ICD-10-CM | POA: Diagnosis not present

## 2019-06-14 DIAGNOSIS — H43393 Other vitreous opacities, bilateral: Secondary | ICD-10-CM | POA: Diagnosis not present

## 2019-06-14 DIAGNOSIS — Z794 Long term (current) use of insulin: Secondary | ICD-10-CM | POA: Diagnosis not present

## 2019-06-14 DIAGNOSIS — H5203 Hypermetropia, bilateral: Secondary | ICD-10-CM | POA: Diagnosis not present

## 2019-06-14 DIAGNOSIS — H524 Presbyopia: Secondary | ICD-10-CM | POA: Diagnosis not present

## 2019-06-14 DIAGNOSIS — E119 Type 2 diabetes mellitus without complications: Secondary | ICD-10-CM | POA: Diagnosis not present

## 2019-06-14 MED ORDER — DOXYCYCLINE HYCLATE 100 MG PO TABS: 100.0000 mg | ORAL_TABLET | Freq: Two times a day (BID) | ORAL | 0 refills | Status: DC

## 2019-06-14 NOTE — Telephone Encounter (Signed)
Patient is calling to r/s her appointment again, and I dont have anywhere to reschedule her to until 5/10, that is actually too far off, can you please give patient a call to r/s for a sooner appointment.

## 2019-06-15 ENCOUNTER — Other Ambulatory Visit: Payer: Self-pay | Admitting: *Deleted

## 2019-06-15 DIAGNOSIS — L97909 Non-pressure chronic ulcer of unspecified part of unspecified lower leg with unspecified severity: Secondary | ICD-10-CM

## 2019-06-15 DIAGNOSIS — I70299 Other atherosclerosis of native arteries of extremities, unspecified extremity: Secondary | ICD-10-CM

## 2019-06-15 DIAGNOSIS — I739 Peripheral vascular disease, unspecified: Secondary | ICD-10-CM

## 2019-06-16 ENCOUNTER — Ambulatory Visit: Payer: HMO | Attending: Internal Medicine

## 2019-06-16 DIAGNOSIS — Z23 Encounter for immunization: Secondary | ICD-10-CM

## 2019-06-16 NOTE — Progress Notes (Signed)
   Covid-19 Vaccination Clinic  Name:  KEYAH BLIZARD    MRN: [id number] DOB: [birth year]  06/16/2019  Ms. Rasmus was observed post Covid-19 immunization for 15 minutes without incident. She was provided with Vaccine Information Sheet and instruction to access the V-Safe system.   Ms. Schermerhorn was instructed to call 911 with any severe reactions post vaccine: Marland Kitchen Difficulty breathing  . Swelling of face and throat  . A fast heartbeat  . A bad rash all over body  . Dizziness and weakness   Immunizations Administered    Name Date Dose VIS Date Route   Pfizer COVID-19 Vaccine 06/16/2019  8:11 AM 0.3 mL 02/09/2019 Intramuscular   Manufacturer: [place]   Lot: [id number]   [place]: [id number]

## 2019-06-18 ENCOUNTER — Other Ambulatory Visit: Payer: Self-pay

## 2019-06-18 ENCOUNTER — Ambulatory Visit: Payer: HMO | Admitting: Podiatry

## 2019-06-18 ENCOUNTER — Encounter: Payer: Self-pay | Admitting: Podiatry

## 2019-06-18 DIAGNOSIS — I96 Gangrene, not elsewhere classified: Secondary | ICD-10-CM | POA: Diagnosis not present

## 2019-06-18 DIAGNOSIS — I739 Peripheral vascular disease, unspecified: Secondary | ICD-10-CM

## 2019-06-18 DIAGNOSIS — M869 Osteomyelitis, unspecified: Secondary | ICD-10-CM | POA: Diagnosis not present

## 2019-06-18 DIAGNOSIS — R269 Unspecified abnormalities of gait and mobility: Secondary | ICD-10-CM | POA: Diagnosis not present

## 2019-06-18 DIAGNOSIS — J449 Chronic obstructive pulmonary disease, unspecified: Secondary | ICD-10-CM | POA: Diagnosis not present

## 2019-06-18 NOTE — Progress Notes (Signed)
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  She states that she is doing better the toes as well as the heel starting to scab over.  She did call significant clear drainage so I restarted antibiotics.  She is on doxycycline.  She is concerned about her left foot as well.  She has no wound to the left foot but she has noticed discoloration of the toes.  She has new arterial studies ordered for later this week. Denies any systemic complaints such as fevers, chills, nausea, vomiting. No acute changes since last appointment, and no other complaints at this time.   Objective: AAO x3, NAD On the dorsal aspect of the right hallux is an eschar with significant hyperkeratotic tissue which is loose.  Also present medial aspect hallux as well as the medial heel.  Is able to debride quite a bit of loose tissue today without any complications or bleeding.  There is new, healthy skin present with a small eschar does remain at the distal and medial aspect of the hallux.  There is no drainage or pus. No open lesions or pre-ulcerative lesions.  No pain with calf compression, swelling, warmth, erythema          Assessment: 69 year old female with gangrene/ulceration to the right hallux/heel  Plan: -All treatment options discussed with the patient including all alternatives, risks, complications.  -I sharply debrided the loose tissue to the any complications or bleeding.  I would continue with daily dressing changes with Mepilex.  Finish course of antibiotics.  Offloading at all times.  She wants to return to regular shoe but I advised her to remain in the surgical shoe for offloading till the wounds heal. -She has repeat arterial studies later this week.  Follow-up with vascular surgery. -Monitor for any clinical signs or symptoms of infection and directed to call the office immediately should any occur or go to the  ER.  Trula Slade DPM

## 2019-06-20 ENCOUNTER — Ambulatory Visit: Payer: Self-pay

## 2019-06-21 ENCOUNTER — Telehealth (HOSPITAL_COMMUNITY): Payer: Self-pay

## 2019-06-21 NOTE — Telephone Encounter (Signed)

## 2019-06-22 ENCOUNTER — Other Ambulatory Visit: Payer: Self-pay

## 2019-06-22 ENCOUNTER — Ambulatory Visit (INDEPENDENT_AMBULATORY_CARE_PROVIDER_SITE_OTHER)
Admit: 2019-06-22 | Discharge: 2019-06-22 | Disposition: A | Discharge status: Home / Self Care | Payer: HMO | Attending: Vascular Surgery | Admitting: Vascular Surgery

## 2019-06-22 ENCOUNTER — Ambulatory Visit (HOSPITAL_COMMUNITY)
Admission: RE | Admit: 2019-06-22 | Discharge: 2019-06-22 | Disposition: A | Discharge status: Home / Self Care | Payer: HMO | Source: Ambulatory Visit | Attending: Vascular Surgery | Admitting: Vascular Surgery

## 2019-06-22 ENCOUNTER — Ambulatory Visit (INDEPENDENT_AMBULATORY_CARE_PROVIDER_SITE_OTHER): Payer: HMO | Admitting: Physician Assistant

## 2019-06-22 VITALS — BP 146/76 | HR 62 | Resp 20 | Wt 191.4 lb

## 2019-06-22 DIAGNOSIS — I70299 Other atherosclerosis of native arteries of extremities, unspecified extremity: Secondary | ICD-10-CM | POA: Diagnosis not present

## 2019-06-22 DIAGNOSIS — I739 Peripheral vascular disease, unspecified: Secondary | ICD-10-CM

## 2019-06-22 DIAGNOSIS — L97909 Non-pressure chronic ulcer of unspecified part of unspecified lower leg with unspecified severity: Secondary | ICD-10-CM

## 2019-06-22 NOTE — Progress Notes (Signed)
Office Note     CC:  follow up Requesting Provider:  Mackie Pai, PA-C  HPI: Alexis [name] is a 69 y.o. (Aug 06, 1950) female who presents for post procedure follow-up, chronic right foot ulcers.  She had previous intervention of the right SFA and popliteal arteries in 2018.  She presented in March of this year with chronic right first toe ischemic ulcer and right heel ulcer.  On May 14, 2019 she underwent atherectomy of the right SFA and popliteal arteries and right popliteal artery stent placement.  She sees Dr. Jacqualyn Posey on a regular basis who is following and treating her wounds.   She denies pain with exertion or rest pain.  She has chronic bilateral foot pain secondary to diabetic neuropathy.  The pt is on a statin for cholesterol management.  The pt is nott on a daily aspirin.   Other AC:  Plavix The pt is on ARB, BB, diuretic for hypertension.   The pt is diabetic.  insulin Tobacco hx: quit 2000  Past Medical History:  Diagnosis Date  . Allergy   . Anxiety   . Arthritis   . Asthma   . Atrial fibrillation ([place])   . CHF (congestive heart failure) ([place])   . Cirrhosis ([place])   . COPD (chronic obstructive pulmonary disease) ([place])   . Depression 05/26/2019  . Diabetes mellitus without complication ([place])   . Hyperlipidemia   . Hypertension   . Macrocytic anemia 09/04/2018  . Myocardial infarction ([place])    several  . Pacemaker    CRT-P with RV lead and His Bundle lead ( high threshold)   . Peripheral neuropathy   . Pneumonia   . PONV (postoperative nausea and vomiting)    unsure of exactly what happened at [facility] in 2010 (knee surgery) that led to crital care  . Thyroid disease     Past Surgical History:  Procedure Laterality Date  . ABDOMINAL AORTOGRAM W/LOWER EXTREMITY N/A 04/29/2016   Procedure: Abdominal Aortogram w/Lower Extremity;  Surgeon: Waynetta Sandy, MD;  Location: [place] [facility];  Service: Cardiovascular;  Laterality: N/A;  .  ABDOMINAL AORTOGRAM W/LOWER EXTREMITY N/A 05/06/2017   Procedure: ABDOMINAL AORTOGRAM W/LOWER EXTREMITY;  Surgeon: Angelia Mould, MD;  Location: [place] [facility];  Service: Cardiovascular;  Laterality: N/A;  bilateral  . ABDOMINAL AORTOGRAM W/LOWER EXTREMITY Bilateral 05/04/2019   Procedure: ABDOMINAL AORTOGRAM W/LOWER EXTREMITY;  Surgeon: Angelia Mould, MD;  Location: [place] [facility];  Service: Cardiovascular;  Laterality: Bilateral;  . ABDOMINAL HYSTERECTOMY    . AMPUTATION Right 07/30/2016   Procedure: AMPUTATION RIGHT SECOND TOE;  Surgeon: Angelia Mould, MD;  Location: [facility];  Service: Vascular;  Laterality: Right;  . CARDIAC CATHETERIZATION     2005 at [place] N/A 12/25/2018   Procedure: COLONOSCOPY WITH PROPOFOL;  Surgeon: Jackquline Denmark, MD;  Location: [facility];  Service: Endoscopy;  Laterality: N/A;  . ESOPHAGEAL BANDING  12/25/2018   Procedure: ESOPHAGEAL BANDING;  Surgeon: Jackquline Denmark, MD;  Location: [facility];  Service: Endoscopy;;  . ESOPHAGEAL BANDING  05/01/2019   Procedure: ESOPHAGEAL BANDING;  Surgeon: Jackquline Denmark, MD;  Location: [facility];  Service: Endoscopy;;  . ESOPHAGOGASTRODUODENOSCOPY (EGD) WITH PROPOFOL N/A 12/25/2018   Procedure: ESOPHAGOGASTRODUODENOSCOPY (EGD) WITH PROPOFOL;  Surgeon: Jackquline Denmark, MD;  Location: [facility];  Service: Endoscopy;  Laterality: N/A;  . ESOPHAGOGASTRODUODENOSCOPY (EGD) WITH PROPOFOL N/A 05/01/2019   Procedure: ESOPHAGOGASTRODUODENOSCOPY (EGD) WITH PROPOFOL;  Surgeon: Lyndel Safe,  Peyton Bottoms, MD;  Location: [facility];  Service: Endoscopy;  Laterality: N/A;  . LOWER EXTREMITY ANGIOGRAPHY Right 05/14/2019   Procedure: LOWER EXTREMITY ANGIOGRAPHY;  Surgeon: Waynetta Sandy, MD;  Location: [facility];  Service: Cardiovascular;  Laterality: Right;  . PERIPHERAL VASCULAR ATHERECTOMY Right 04/29/2016   Procedure: Peripheral Vascular Atherectomy-Right Popliteal;   Surgeon: Waynetta Sandy, MD;  Location: [facility];  Service: Cardiovascular;  Laterality: Right;  . PERIPHERAL VASCULAR ATHERECTOMY Right 05/14/2019   Procedure: PERIPHERAL VASCULAR ATHERECTOMY;  Surgeon: Waynetta Sandy, MD;  Location: [facility];  Service: Cardiovascular;  Laterality: Right;  right SFA/pop  . PERIPHERAL VASCULAR INTERVENTION Right 04/29/2016   Procedure: Peripheral Vascular Intervention-Right Popliteal;  Surgeon: Waynetta Sandy, MD;  Location: [place];  Service: Cardiovascular;  Laterality: Right;  POPLITEAL PTA  . PERIPHERAL VASCULAR INTERVENTION Right 05/14/2019   Procedure: PERIPHERAL VASCULAR INTERVENTION;  Surgeon: Waynetta Sandy, MD;  Location: [place] [facility];  Service: Cardiovascular;  Laterality: Right;  popliteal stent  . RADIOFREQUENCY ABLATION    . TOTAL KNEE ARTHROPLASTY    . TUBAL LIGATION      Social History   Socioeconomic History  . Marital status: Married    Spouse name: Juanda Crumble  . Number of children: 1  . Years of education: Not on file  . Highest education level: Not on file  Occupational History  . 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 Smoking status: Former Smoker    Quit date: 03/01/1998    Years since quitting: 21.3  . Smokeless tobacco: Never Used  Substance and Sexual Activity  . 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  . Drug use: Yes    Types: Marijuana    Comment: remote use  . Sexual activity: Never  Other Topics Concern  . 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 Difficulty of Paying Living Expenses: Patient refused  Food Insecurity: No Food Insecurity  . Worried About Charity fundraiser in the Last Year: Never true  . Ran Out of Food in the Last Year: Never true  Transportation Needs: No Transportation Needs  . Lack of Transportation (Medical): No  . Lack of Transportation (Non-Medical): No  Physical Activity: Unknown  . Days of Exercise per Week: 0 days  . Minutes of Exercise per Session: Not on file  Stress: No Stress Concern Present  . Feeling of Stress : Only a little  Social Connections: Unknown  . Frequency of Communication with Friends and Family: More than three times a week  . Frequency of Social Gatherings with Friends and Family: Not on file  . Attends Religious Services: Not on file  . Active Member of Clubs or Organizations: Not on file  . Attends Archivist Meetings: Not on file  . Marital Status: Married  Human resources officer Violence:   . Fear of Current or Ex-Partner:   . Emotionally Abused:   Marland Kitchen Physically Abused:   . Sexually Abused:  Family History  Problem Relation Age of Onset  . Diabetes Mother   . Hyperlipidemia Mother   . Diabetes Father   . Hyperlipidemia Father   . Heart disease Father        before age 88  . Heart attack Father   . Diabetes Brother   . Hyperlipidemia Brother   . Heart attack Brother     Current Outpatient Medications  Medication Sig Dispense Refill  . albuterol (VENTOLIN HFA) 108 (90 Base) MCG/ACT inhaler Inhale 2 puffs into the lungs every 6 (six) hours as needed for wheezing or shortness of breath.    . allopurinol (ZYLOPRIM) 100 MG tablet TAKE ONE TABLET BY MOUTH TWICE DAILY (Patient taking differently: 100 mg 2 (two) times daily. ) 60 tablet 3  . AMBULATORY NON FORMULARY MEDICATION Motorized scooter.  Diagnosis: Osteoarthritis M19.90 1 each 0  . atorvastatin (LIPITOR) 10 MG tablet Take 1 tablet (10 mg total) by mouth daily. 30 tablet 11  .  clopidogrel (PLAVIX) 75 MG tablet Take 1 tablet (75 mg total) by mouth daily. 30 tablet 11  . Continuous Blood Gluc Receiver (FREESTYLE LIBRE 14 DAY READER) DEVI Use as directed to monitor BG.  Dx E11.65    . doxycycline (ADOXA) 100 MG tablet Take 1 tablet (100 mg total) by mouth 2 (two) times daily. 20 tablet 1  . doxycycline (VIBRA-TABS) 100 MG tablet Take 1 tablet (100 mg total) by mouth 2 (two) times daily. 20 tablet 0  . fluticasone (FLONASE) 50 MCG/ACT nasal spray Place 2 sprays into both nostrils at bedtime. (Patient taking differently: Place 2 sprays into both nostrils 2 (two) times daily. )    . furosemide (LASIX) 20 MG tablet Take 1-2 tablets (20-40 mg total) by mouth See admin instructions. Take 40 mg in the morning and 20 mg in the afternoon. PLEASE CALL OFFICE TO SCHEDULE AN APPOINTMENT 90 tablet 0  . Insulin Aspart FlexPen 100 UNIT/ML SOPN Inject 0-10 Units into the skin as needed. If glucose if over 200    . insulin degludec (TRESIBA) 100 UNIT/ML FlexTouch Pen Inject 10 Units into the skin daily.     . Insulin Pen Needle (PEN NEEDLES 31GX5/16") 31G X 8 MM MISC Use as needed 4 times/day.  Dx E11.65    . levocetirizine (XYZAL) 5 MG tablet TAKE 1 TABLET BY MOUTH EVERY EVENING 30 tablet 2  . levothyroxine (SYNTHROID) 150 MCG tablet Take 150 mcg by mouth every morning.    . Lidocaine HCl 4 % LIQD Apply 10 mLs topically 2 (two) times daily. (Patient taking differently: Apply 10 mLs topically 2 (two) times daily as needed (pain). ) 73 mL 0  . losartan (COZAAR) 25 MG tablet Take 1 tablet (25 mg total) by mouth daily. PLEASE CALL OFFICE TO SCHEDULE APPOINTMENT 30 tablet 0  . metoprolol succinate (TOPROL-XL) 25 MG 24 hr tablet TAKE ONE (1) TABLET BY MOUTH EVERY DAY 90 tablet 1  . ondansetron (ZOFRAN-ODT) 4 MG disintegrating tablet Take 4 mg by mouth every 8 (eight) hours as needed for nausea or vomiting.     . Prenatal Vit-Fe Fumarate-FA (MULTIVITAMIN-PRENATAL) 27-0.8 MG TABS tablet Take 1 tablet  by mouth daily.     . Probiotic CAPS Take 2 capsules by mouth daily. Does 1000 units    . RABEprazole (ACIPHEX) 20 MG tablet Take 1 tablet (20 mg total) by mouth daily. 30 tablet 1  . sertraline (ZOLOFT) 50 MG tablet Take 1 tablet (50 mg total) by mouth daily. [place]  tablet 3  . spironolactone (ALDACTONE) 50 MG tablet Take 1 tablet (50 mg total) by mouth daily. 30 tablet 11   No current facility-administered medications for this visit.    Allergies  Allergen Reactions  . Indomethacin Other (See Comments)    Renal Insufficiency  . Pregabalin Other (See Comments)    DIZZINESS   . Sulfa Antibiotics Hives  . Morphine And Related Nausea And Vomiting     REVIEW OF SYSTEMS:   [X]  denotes positive finding, [ ]  denotes negative finding Cardiac  Comments:  Chest pain or chest pressure:    Shortness of breath upon exertion:    Short of breath when lying flat:    Irregular heart rhythm:        Vascular    Pain in calf, thigh, or hip brought on by ambulation:    Pain in feet at night that wakes you up from your sleep:     Blood clot in your veins:    Leg swelling:         Pulmonary    Oxygen at home:    Productive cough:     Wheezing:         Neurologic    Sudden weakness in arms or legs:     Sudden numbness in arms or legs:     Sudden onset of difficulty speaking or slurred speech:    Temporary loss of vision in one eye:     Problems with dizziness:         Gastrointestinal    Blood in stool:     Vomited blood:         Genitourinary    Burning when urinating:     Blood in urine:        Psychiatric    Major depression:         Hematologic    Bleeding problems:    Problems with blood clotting too easily:        Skin    Rashes or ulcers:        Constitutional    Fever or chills:      PHYSICAL EXAMINATION:  Vitals:   06/22/19 1041  BP: (!) 146/76  Pulse: 62  Resp: 20  Weight: 191 lb 6.4 oz (86.8 kg)    General:  WDWN in NAD; vital signs documented  above Gait: No ataxia, ambulates with cane HENT: WNL, normocephalic Pulmonary: normal non-labored breathing , Cardiac: regular HR,  Skin: without rashes Vascular Exam/Pulses: Pedal pulses are nonpalpable.  Right foot warm with normal active range of motion. Extremities: with chronic ischemic changes, with dry gangrene , without cellulitis; with open wounds; examination of the right foot reveals 2 ulcers of the right great toe of approximately 1 cm in size.  Both ulcers have dry eschar.  The right heel ulcer is. Musculoskeletal: no muscle wasting or atrophy  Neurologic: A&O X 3;  No focal weakness or paresthesias are detected Psychiatric:  The pt has Normal affect.        Non-Invasive Vascular Imaging:   Right lower extremity duplex study today reveals patent stent with biphasic waveforms.  Velocities range 81 to 114 cm/s  ABI/TBIToday's ABIToday's TBIPrevious ABIPrevious TBI  +-------+-----------+-----------+------------+------------+  Right 0.90    bandage  0.41    0.00      +-------+-----------+-----------+------------+------------+  Left  0.50    0.00    0.55    0.00      ASSESSMENT/PLAN:: 69 y.o. female here for follow up  for occlusive peripheral vascular disease with recent intervention to right lower extremity.  She is status post right popliteal artery stent placement.  She has ischemic ulcers of the right great toe.  Significant improvement in ABIs on the right.  Left ABI is decreased, however she has no tissue loss, skin issues or claudication.  Right great toe ulcers are greatly improved in appearance.  Left heel ulcer is nearly healed.  Continue aspirin, statin, glucose control.  She is seeing Dr. Jacqualyn Posey for continued wound care.  Follow-up in 6 months with duplex and ABIs.  Encouraged her to call for any changes in her wounds, skin on either foot or leg pain.    Barbie Banner, PA-C [facility] [phone]  Clinic MD:   Donzetta Matters

## 2019-06-22 NOTE — Patient Outreach (Signed)
  [facility]) Care Management Chronic Special Needs Program    06/22/2019  Name: ROSALIE BUENAVENTURA, DOB: Dec 13, 1950  MRN: [id number]   Ms. Bindi Klomp is enrolled in a chronic special needs plan for Diabetes. Telephone call to client for follow up. HIPAA verified. Client states she is doing better from recent admission. She reports her toe and foot are healing well.  She states she is still receiving services with [facility] home [facility] 1 time per week for dressing changes to her abdomen. She reports she changed the dressing to her abdomen yesterday evening. She states the small wound area to her abdomen is still red. Client reports doctor is aware.   Client states she is taking her last antibiotic pill today. She reports recent follow up with the vascular surgeon and her podiatrist regarding her foot/ toe wounds.  Client reports she has lost 65 lbs which she is very proud of and states her blood sugars are more in control. Client denies any further needs at this time.   PLAN: RNCM will follow up with client within 1 month.   Quinn Plowman RN,BSN,CCM [facility] [phone]

## 2019-06-25 ENCOUNTER — Ambulatory Visit: Payer: Self-pay

## 2019-06-25 ENCOUNTER — Encounter: Payer: Self-pay | Admitting: Medical

## 2019-06-26 ENCOUNTER — Other Ambulatory Visit: Payer: Self-pay | Admitting: *Deleted

## 2019-06-26 DIAGNOSIS — L97909 Non-pressure chronic ulcer of unspecified part of unspecified lower leg with unspecified severity: Secondary | ICD-10-CM

## 2019-06-26 DIAGNOSIS — I739 Peripheral vascular disease, unspecified: Secondary | ICD-10-CM

## 2019-06-26 DIAGNOSIS — I70299 Other atherosclerosis of native arteries of extremities, unspecified extremity: Secondary | ICD-10-CM

## 2019-06-28 ENCOUNTER — Encounter: Payer: Self-pay | Admitting: Medical

## 2019-06-29 DIAGNOSIS — M818 Other osteoporosis without current pathological fracture: Secondary | ICD-10-CM | POA: Diagnosis not present

## 2019-06-29 MED ORDER — FLUTICASONE PROPIONATE 50 MCG/ACT NA SUSP: 2.0000 | Freq: Every day | NASAL | Status: DC

## 2019-07-02 ENCOUNTER — Encounter: Payer: Self-pay | Admitting: Medical

## 2019-07-03 ENCOUNTER — Encounter: Payer: Self-pay | Admitting: Podiatry

## 2019-07-03 ENCOUNTER — Other Ambulatory Visit: Payer: Self-pay

## 2019-07-03 ENCOUNTER — Telehealth (INDEPENDENT_AMBULATORY_CARE_PROVIDER_SITE_OTHER): Payer: HMO | Admitting: Medical

## 2019-07-03 ENCOUNTER — Ambulatory Visit: Payer: HMO | Admitting: Podiatry

## 2019-07-03 ENCOUNTER — Encounter: Payer: Self-pay | Admitting: Medical

## 2019-07-03 ENCOUNTER — Telehealth: Payer: Self-pay | Admitting: Medical

## 2019-07-03 VITALS — Temp 97.7°F

## 2019-07-03 DIAGNOSIS — T148XXD Other injury of unspecified body region, subsequent encounter: Secondary | ICD-10-CM

## 2019-07-03 DIAGNOSIS — I96 Gangrene, not elsewhere classified: Secondary | ICD-10-CM

## 2019-07-03 DIAGNOSIS — T148XXA Other injury of unspecified body region, initial encounter: Secondary | ICD-10-CM | POA: Diagnosis not present

## 2019-07-03 DIAGNOSIS — I739 Peripheral vascular disease, unspecified: Secondary | ICD-10-CM

## 2019-07-03 DIAGNOSIS — L97411 Non-pressure chronic ulcer of right heel and midfoot limited to breakdown of skin: Secondary | ICD-10-CM | POA: Diagnosis not present

## 2019-07-03 NOTE — Telephone Encounter (Signed)
Patient wants referral to see derm.

## 2019-07-03 NOTE — Progress Notes (Signed)
   Subjective:    Patient ID: Alexis [name], female    DOB: January 21, 1951, 69 y.o.   MRN: [id number]  HPI  Virtual Visit via Video Note  I connected with Alexis [name] on 07/03/19 at  3:40 PM EDT by a video enabled telemedicine application and verified that I am speaking with the correct person using two identifiers.  Location: Patient: home Provider: office.   I discussed the limitations of evaluation and management by telemedicine and the availability of in person appointments. The patient expressed understanding and agreed to proceed.  Pt did not check her bp or pulse.  History of Present Illness:   Pt has left upper abdomen. Open wound and raw. Pt has seen wound care has tried to heal area various times. Home healthy was coming out but then quit since no progress per pt. Pt has scheduled to see dermatologist Dr. Tamala Julian on Thursday at 2:30. She has seen him before.   Observations/Objective: General-no acute distress, pleasant, oriented. Lungs- on inspection lungs appear unlabored. Neck- no tracheal deviation or jvd on inspection. Neuro- gross motor function appears intact. Abdomen- wrapped presently.  Assessment and Plan: For chronic superficial wound on abdomen will place referral to dermatologist. Let me know if any problem with referral being placed since your appointment is this Thursday.  Please update me on your bp and pulse today.  Time spent with patient today was 20  minutes which consisted of chart review, discussing diagnosis, referral and documentation.  Mackie Pai, PA-C  Follow Up Instructions:    I discussed the assessment and treatment plan with the patient. The patient was provided an opportunity to ask questions and all were answered. The patient agreed with the plan and demonstrated an understanding of the instructions.   The patient was advised to call back or seek an in-person evaluation if the symptoms worsen or if the condition fails to  improve as anticipated.     Mackie Pai, PA-C   Review of Systems     Objective:   Physical Exam        Assessment & Plan:

## 2019-07-03 NOTE — Telephone Encounter (Signed)
Will you call pt for number of dermatologist. She need referral placed by tomorrow.

## 2019-07-03 NOTE — Patient Instructions (Signed)
For chronic superficial wound on abdomen will place referral to dermatologist. Let me know if any problem with referral being placed since your appointment is this Thursday.  Please update me on your bp and pulse today.

## 2019-07-04 ENCOUNTER — Encounter: Payer: Self-pay | Admitting: Medical

## 2019-07-04 NOTE — Telephone Encounter (Signed)
I have called [facility] as pt has been there before. She has appt with Dr. Orbie Hurst 07/05/19. Referral faxed via [facility].

## 2019-07-05 DIAGNOSIS — L304 Erythema intertrigo: Secondary | ICD-10-CM | POA: Diagnosis not present

## 2019-07-05 DIAGNOSIS — L900 Lichen sclerosus et atrophicus: Secondary | ICD-10-CM | POA: Diagnosis not present

## 2019-07-05 DIAGNOSIS — L989 Disorder of the skin and subcutaneous tissue, unspecified: Secondary | ICD-10-CM | POA: Diagnosis not present

## 2019-07-06 ENCOUNTER — Other Ambulatory Visit: Payer: Self-pay | Admitting: Cardiology

## 2019-07-06 ENCOUNTER — Other Ambulatory Visit (HOSPITAL_COMMUNITY)
Admission: RE | Admit: 2019-07-06 | Discharge: 2019-07-06 | Disposition: A | Discharge status: Home / Self Care | Payer: HMO | Source: Ambulatory Visit | Attending: Gastroenterology | Admitting: Gastroenterology

## 2019-07-06 DIAGNOSIS — Z20822 Contact with and (suspected) exposure to covid-19: Secondary | ICD-10-CM | POA: Insufficient documentation

## 2019-07-06 DIAGNOSIS — S31109D Unspecified open wound of abdominal wall, unspecified quadrant without penetration into peritoneal cavity, subsequent encounter: Secondary | ICD-10-CM | POA: Diagnosis not present

## 2019-07-06 DIAGNOSIS — I110 Hypertensive heart disease with heart failure: Secondary | ICD-10-CM | POA: Diagnosis not present

## 2019-07-06 DIAGNOSIS — Z01812 Encounter for preprocedural laboratory examination: Secondary | ICD-10-CM | POA: Insufficient documentation

## 2019-07-06 DIAGNOSIS — I4891 Unspecified atrial fibrillation: Secondary | ICD-10-CM | POA: Diagnosis not present

## 2019-07-06 DIAGNOSIS — G609 Hereditary and idiopathic neuropathy, unspecified: Secondary | ICD-10-CM | POA: Diagnosis not present

## 2019-07-06 DIAGNOSIS — E119 Type 2 diabetes mellitus without complications: Secondary | ICD-10-CM | POA: Diagnosis not present

## 2019-07-06 DIAGNOSIS — I509 Heart failure, unspecified: Secondary | ICD-10-CM | POA: Diagnosis not present

## 2019-07-06 DIAGNOSIS — N390 Urinary tract infection, site not specified: Secondary | ICD-10-CM | POA: Diagnosis not present

## 2019-07-06 NOTE — Progress Notes (Signed)
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  States that she is doing better she is happy with the progress so far.  She is continue with daily dressing changes.  She denies any new sores or any new issues.  Denies any systemic complaints such as fevers, chills, nausea, vomiting. No acute changes since last appointment, and no other complaints at this time.   Objective: AAO x3, NAD On the  distal aspect of the right hallux is an eschar with significant hyperkeratotic tissue which is loose.  Upon opening there is new, pink skin present but there is still superficial wound identified and some of the scab was not quite ready to come off.  On the medial aspect of the foot is some fibrotic tissue with superficial wound.  There is no surrounding erythema, ascending cellulitis there is no fluctuation or crepitation.  There is no malodor.  The ulcer that was present in the bilateral heels almost completely healed at this time.  There is no new ulcerations identified. No open lesions or pre-ulcerative lesions.  No pain with calf compression, swelling, warmth, erythema           Assessment: 69 year old female with gangrene/ulceration to the right hallux/heel  Plan: -All treatment options discussed with the patient including all alternatives, risks, complications.  -While he debrided the loose tissue utilizing in #312 with scalpel without any complications or bleeding.  Continue the daily dressing changes as previous. -Continue with offloading shoe. -Monitor for any clinical signs or symptoms of infection and directed to call the office immediately should any occur or go to the ER.  Trula Slade DPM

## 2019-07-07 ENCOUNTER — Encounter: Payer: Self-pay | Admitting: Medical

## 2019-07-07 LAB — SARS CORONAVIRUS 2 (TAT 6-24 HRS): SARS Coronavirus 2: NEGATIVE

## 2019-07-09 ENCOUNTER — Ambulatory Visit: Payer: HMO | Attending: Internal Medicine

## 2019-07-09 ENCOUNTER — Telehealth: Payer: Self-pay | Admitting: Gastroenterology

## 2019-07-09 DIAGNOSIS — Z23 Encounter for immunization: Secondary | ICD-10-CM

## 2019-07-09 MED ORDER — FLUTICASONE PROPIONATE 50 MCG/ACT NA SUSP: 2.0000 | Freq: Every day | NASAL | 3 refills | Status: AC

## 2019-07-09 NOTE — Progress Notes (Signed)
   Covid-19 Vaccination Clinic  Name:  Alexis [name]    MRN: [id number] DOB: Nov 02, 1950  07/09/2019  [name] was observed post Covid-19 immunization for 15 minutes without incident. She was provided with Vaccine Information Sheet and instruction to access the V-Safe system.   [name] was instructed to call 911 with any severe reactions post vaccine: Marland Kitchen Difficulty breathing  . Swelling of face and throat  . A fast heartbeat  . A bad rash all over body  . Dizziness and weakness   Immunizations Administered    Name Date Dose VIS Date Route   Pfizer COVID-19 Vaccine 07/09/2019  9:30 AM 0.3 mL 04/25/2018 Intramuscular   Manufacturer: [place]   Lot: [id number]   [place]: [id number]

## 2019-07-09 NOTE — Telephone Encounter (Signed)
Patient is wanting to let us know that she had her 2nd vaccine today. Has hospital procedure for tomorrow will call in the morning if she is not well.

## 2019-07-09 NOTE — Anesthesia Preprocedure Evaluation (Addendum)
Anesthesia Evaluation  Patient identified by MRN, date of birth, ID band Patient awake    Reviewed: Allergy & Precautions, NPO status , Patient's Chart, lab work & pertinent test results  History of Anesthesia Complications (+) PONV  Airway Mallampati: I  TM Distance: >3 FB Neck ROM: Full    Dental no notable dental hx. (+) Upper Dentures, Dental Advisory Given   Pulmonary asthma , COPD,  COPD inhaler, former smoker,    Pulmonary exam normal breath sounds clear to auscultation       Cardiovascular hypertension, Pt. on medications and Pt. on home beta blockers + Past MI, + Peripheral Vascular Disease and +CHF  negative cardio ROS Normal cardiovascular exam+ pacemaker  Rhythm:Regular Rate:Normal     Neuro/Psych PSYCHIATRIC DISORDERS Anxiety TIA   GI/Hepatic negative GI ROS, (+) Cirrhosis   Esophageal Varices and ascites    ,   Endo/Other  diabetes, Type 1, Insulin DependentHypothyroidism Morbid obesity  Renal/GU negative Renal ROS     Musculoskeletal   Abdominal   Peds  Hematology  (+) anemia ,   Anesthesia Other Findings   Reproductive/Obstetrics negative OB ROS                            Anesthesia Physical Anesthesia Plan  ASA: IV  Anesthesia Plan: MAC   Post-op Pain Management:    Induction: Intravenous  PONV Risk Score and Plan: Treatment may vary due to age or medical condition  Airway Management Planned: Nasal Cannula and Natural Airway  Additional Equipment: None  Intra-op Plan:   Post-operative Plan:   Informed Consent: I have reviewed the patients History and Physical, chart, labs and discussed the procedure including the risks, benefits and alternatives for the proposed anesthesia with the patient or authorized representative who has indicated his/her understanding and acceptance.     Dental advisory given  Plan Discussed with:   Anesthesia Plan Comments:  (GERD  Cirrhosis esophageal varices  Case cancelle by surgeon d pt on plavix)       Anesthesia Quick Evaluation

## 2019-07-10 ENCOUNTER — Other Ambulatory Visit: Payer: Self-pay

## 2019-07-10 ENCOUNTER — Encounter (HOSPITAL_COMMUNITY): Payer: Self-pay | Admitting: Gastroenterology

## 2019-07-10 ENCOUNTER — Ambulatory Visit (HOSPITAL_COMMUNITY)
Admission: RE | Admit: 2019-07-10 | Discharge: 2019-07-10 | Disposition: A | Discharge status: Home / Self Care | Payer: HMO | Source: Ambulatory Visit | Attending: Gastroenterology | Admitting: Gastroenterology

## 2019-07-10 ENCOUNTER — Telehealth: Payer: Self-pay | Admitting: Medical

## 2019-07-10 ENCOUNTER — Telehealth: Payer: Self-pay | Admitting: Gastroenterology

## 2019-07-10 ENCOUNTER — Ambulatory Visit (HOSPITAL_COMMUNITY): Payer: HMO | Admitting: Anesthesiology

## 2019-07-10 ENCOUNTER — Encounter (HOSPITAL_COMMUNITY)
Admission: RE | Disposition: A | Discharge status: Home / Self Care | Payer: Self-pay | Source: Ambulatory Visit | Attending: Gastroenterology

## 2019-07-10 DIAGNOSIS — Z01818 Encounter for other preprocedural examination: Secondary | ICD-10-CM

## 2019-07-10 DIAGNOSIS — Z5309 Procedure and treatment not carried out because of other contraindication: Secondary | ICD-10-CM | POA: Insufficient documentation

## 2019-07-10 DIAGNOSIS — K219 Gastro-esophageal reflux disease without esophagitis: Secondary | ICD-10-CM

## 2019-07-10 DIAGNOSIS — K7460 Unspecified cirrhosis of liver: Secondary | ICD-10-CM | POA: Diagnosis not present

## 2019-07-10 DIAGNOSIS — I8500 Esophageal varices without bleeding: Secondary | ICD-10-CM | POA: Insufficient documentation

## 2019-07-10 LAB — GLUCOSE, CAPILLARY: Glucose-Capillary: 172 mg/dL — ABNORMAL HIGH (ref 70–99)

## 2019-07-10 SURGERY — CANCELLED PROCEDURE
Anesthesia: Monitor Anesthesia Care

## 2019-07-10 MED ORDER — SODIUM CHLORIDE 0.9 % IV SOLN: INTRAVENOUS | Status: DC

## 2019-07-10 MED ORDER — LACTATED RINGERS IV SOLN: INTRAVENOUS | Status: DC

## 2019-07-10 SURGICAL SUPPLY — 14 items

## 2019-07-10 NOTE — Telephone Encounter (Signed)
Not so sure what she is asking Several medicines for autoimmune disorders including steroids I could not find anything in the notes She can always use medications Let us know specifically which ones  RG

## 2019-07-10 NOTE — Telephone Encounter (Signed)
Pls call pt. She stated that she was just dx with an autoimmune disease and would like to know what kind of medications she cannot take due to her liver issues.

## 2019-07-10 NOTE — Telephone Encounter (Signed)
Caller Shanica Castellanos  Call Back # [phone]  Patient has a rare autoimmune disease. This is a result of her biopsy

## 2019-07-10 NOTE — Progress Notes (Signed)
EGD canceled per verbal order from Dr. Lyndel Safe.  Pt took Plavix on 07/09/19.  Education provided to pt and pt verbalized understanding.

## 2019-07-10 NOTE — Telephone Encounter (Signed)
Please advise 

## 2019-07-11 ENCOUNTER — Ambulatory Visit: Payer: Self-pay

## 2019-07-11 NOTE — Telephone Encounter (Signed)
Patient says she is not sure what the name of the auto immune disease is but she is being referred to a doctor in [place] for it. Patient says she will call back once she has more information.

## 2019-07-11 NOTE — Telephone Encounter (Signed)
I have called patient and left message for her to return my call.

## 2019-07-13 ENCOUNTER — Ambulatory Visit: Payer: HMO | Admitting: Cardiology

## 2019-07-16 ENCOUNTER — Encounter: Payer: Self-pay | Admitting: Medical

## 2019-07-16 ENCOUNTER — Ambulatory Visit (HOSPITAL_BASED_OUTPATIENT_CLINIC_OR_DEPARTMENT_OTHER): Admission: RE | Admit: 2019-07-16 | Payer: HMO | Source: Ambulatory Visit

## 2019-07-16 ENCOUNTER — Encounter: Payer: Self-pay | Admitting: Podiatry

## 2019-07-18 ENCOUNTER — Other Ambulatory Visit: Payer: Self-pay

## 2019-07-18 NOTE — Patient Outreach (Addendum)
  [facility]) Care Management Chronic Special Needs Program   07/18/2019  Name: Alexis [name], DOB: 05-23-1950  MRN: [id number]  The client was discussed in today's interdisciplinary care team meeting.  The following issues were discussed:  Client's needs, Changes in health status, Key risk triggers/risk stratification, Care Plan, Coordination of care and Issues/barriers to care  Bary Castilla, BSN, MS, CCM  Quinn Plowman, BSN, CCM   [employer], BSN, CCM, CDE  Thea Silversmith, BSN, MSN, CCM  Jacqlyn Larsen, RN, BSN   [facility] Care, CBCC/ CMAA  Dr. Maryella Shivers   Dr. Coralie Carpen   Roney Mans, RD, LDN ([facility])  Karrie Meres, Pharm D ([facility])  Babs Bertin, RN ([facility])  Dr. Cherre Huger ([facility])      Plan:  Continue care coordination and collaboration with [facility] team RNCM will continue to follow as clients CSNP care management care coordinator.   Quinn Plowman RN,BSN,CCM [facility] [phone]

## 2019-07-19 ENCOUNTER — Encounter: Payer: Self-pay | Admitting: Medical

## 2019-07-20 ENCOUNTER — Ambulatory Visit: Payer: Self-pay

## 2019-07-20 NOTE — Telephone Encounter (Signed)
Please advise on med request

## 2019-07-22 ENCOUNTER — Encounter: Payer: Self-pay | Admitting: Medical

## 2019-07-23 ENCOUNTER — Ambulatory Visit (INDEPENDENT_AMBULATORY_CARE_PROVIDER_SITE_OTHER): Payer: HMO | Admitting: Podiatry

## 2019-07-23 ENCOUNTER — Other Ambulatory Visit: Payer: Self-pay

## 2019-07-23 DIAGNOSIS — L97411 Non-pressure chronic ulcer of right heel and midfoot limited to breakdown of skin: Secondary | ICD-10-CM

## 2019-07-23 DIAGNOSIS — I739 Peripheral vascular disease, unspecified: Secondary | ICD-10-CM | POA: Diagnosis not present

## 2019-07-23 DIAGNOSIS — I96 Gangrene, not elsewhere classified: Secondary | ICD-10-CM | POA: Diagnosis not present

## 2019-07-23 NOTE — Progress Notes (Signed)
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  She said that she is doing much better and overall she feels that the wounds are healing.  Denies any drainage or pus.  Occasional discomfort but no swelling or redness or any red streaks.    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  She is currently on steroids.   Denies any systemic complaints such as fevers, chills, nausea, vomiting. No acute changes since last appointment, and no other complaints at this time.   Objective: AAO x3, NAD See pictures below.  Eschar, scab at the distal aspect of the toe which is starting to come off and there is new healthy skin present.  Superficial fibrotic/granular wound on the medial aspect hallux which is starting to heal.  There is no sign of edema, erythema.  The wound on the heel is healed at this time.  There is hyperkeratotic tissue upon debridement the wound is healed. 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  -Lately debrided some of the loose tissue the distal aspect hallux pending complications or bleeding.  Continue daily dressing changes.  Continue offloading all times. -Monitor for any clinical signs or symptoms of infection and directed to call the office immediately should any occur or go to the ER.  Trula Slade DPM

## 2019-07-24 ENCOUNTER — Telehealth: Payer: Self-pay | Admitting: Medical

## 2019-07-24 ENCOUNTER — Telehealth (INDEPENDENT_AMBULATORY_CARE_PROVIDER_SITE_OTHER): Payer: HMO | Admitting: Medical

## 2019-07-24 DIAGNOSIS — M79676 Pain in unspecified toe(s): Secondary | ICD-10-CM | POA: Diagnosis not present

## 2019-07-24 DIAGNOSIS — T148XXA Other injury of unspecified body region, initial encounter: Secondary | ICD-10-CM

## 2019-07-24 MED ORDER — OXYCODONE HCL 5 MG PO TABS: ORAL_TABLET | ORAL | 0 refills | Status: DC

## 2019-07-24 NOTE — Progress Notes (Addendum)
   Subjective:    Patient ID: Alexis [name], female    DOB: [birth year], 69 y.o.   MRN: [id number]  HPI  Virtual Visit via Video Note  I connected with Alexis [name] on 07/24/19 at 11:00 AM EDT by a video enabled telemedicine application and verified that I am speaking with the correct person using two identifiers.  Location: Patient: home Provider: office   I discussed the limitations of evaluation and management by telemedicine and the availability of in person appointments. The patient expressed understanding and agreed to proceed.  History of Present Illness:  Pt in for pain.    9-10 years ago pt was on darvocet for chronic pain(that was discontinued when me withdraw off market). Then pt was switched to norco years ago for neuropathy by former MD. She admits getting dependant on xanax in past. She had a hard time coming off but she did not report buying tablets from other or running out early. But had to be tapered off and had difficulty time.  Pt states recently has rt great toe wound is very tender when changes dressing. Pt has been taking percocet that his friend gave.Pt took 1/2 of 10 mg dose.   Pt had severe side effects to lyrica. Pt failed gabapentin in past. Pt used tramadol and she states it did not help for pain. And she had severe confusion with tramadol  Pt has been on the percocet 10 mg 1/2 tab for 3 days. Pt denies any euphoric. No longer has any access to further tabs.   Observations/Objective: General-no acute distress, pleasant, oriented. Lungs- on inspection lungs appear unlabored. Neck- no tracheal deviation or jvd on inspection. Neuro- gross motor function appears intact. Foot- reviewed picture of foot on podiatrist note yesterday.  Assessment and Plan: For your recent severe rt foot/toe pain, I am giving you only 4 tabs of oxycodone 5 mg to use only 1 tab po q day before dressing change. Not giving form with tylenol since you have elevated liver  enzymes/liver disease.   We had conversation today that I will only give 30 tab of oxycodone after you sign contract and give uds. Controlled med rules and regulations explained.   Will get you to sign contract and give uds.  Expressed will not go higher on dose, refill med early or give more frequency/higher number.  If pain worsens the will refer to pain management.  Follow up one month  or as needed.  Follow Up Instructions:    I discussed the assessment and treatment plan with the patient. The patient was provided an opportunity to ask questions and all were answered. The patient agreed with the plan and demonstrated an understanding of the instructions.   The patient was advised to call back or seek an in-person evaluation if the symptoms worsen or if the condition fails to improve as anticipated.  .Time spent with patient today was  30 minutes which consisted of chart rediew, discussing diagnosis,  treatment and documentation.   Mackie Pai, PA-C   Review of Systems     Objective:   Physical Exam        Assessment & Plan:

## 2019-07-24 NOTE — Telephone Encounter (Signed)
Patient scheduled for Friday for Lab

## 2019-07-24 NOTE — Telephone Encounter (Signed)
Pt needs to give uds and sign controlled medications contract. Can get her scheduled tomorrow or Thursday. Does not need appoitment with me. She will use oxycodone 5 mg 1 tab po q day. 30 tab rx for entire month.

## 2019-07-24 NOTE — Patient Instructions (Addendum)
For your recent severe rt foot/toe pain, I am giving you only 4 tabs of oxycodone 5 mg to use only 1 tab po q day before dressing change. Not giving form with tylenol since you have elevated liver enzymes/liver disease.   We had conversation today that I will only give 30 tab of oxycodone after you sign contract and give uds. Controlled med rules and regulations explained.   Will get you to sign contract and give uds.  Expressed will not go higher on dose, refill med early or give more frequency/higher number.  If pain worsens the will refer to pain management.  Follow up one month  or as needed.

## 2019-07-25 NOTE — Telephone Encounter (Signed)
Okay to set up for EGD at [facility] off Plavix x 5 days. RG

## 2019-07-26 ENCOUNTER — Telehealth: Payer: Self-pay

## 2019-07-26 NOTE — Telephone Encounter (Signed)
Pt states she is no longer on Plavix and hasnt taken since April.  I told patient that Dr Lyndel Safe said to schedule her for EGD at [facility] but there is a wait list.  We will contact her when a date becomes available.  Patient verbalized understanding.

## 2019-07-27 ENCOUNTER — Other Ambulatory Visit: Payer: Self-pay

## 2019-07-27 ENCOUNTER — Other Ambulatory Visit: Payer: HMO

## 2019-07-27 DIAGNOSIS — T148XXA Other injury of unspecified body region, initial encounter: Secondary | ICD-10-CM

## 2019-07-29 ENCOUNTER — Encounter: Payer: Self-pay | Admitting: Medical

## 2019-07-29 LAB — DRUG MONITORING, PANEL 8 WITH CONFIRMATION, URINE
6 Acetylmorphine: NEGATIVE ng/mL (ref <10)
Alcohol Metabolites: NEGATIVE ng/mL
Amphetamines: NEGATIVE ng/mL (ref <500)
Benzodiazepines: NEGATIVE ng/mL (ref <100)
Buprenorphine, Urine: NEGATIVE ng/mL (ref <5)
Cocaine Metabolite: NEGATIVE ng/mL (ref <150)
Creatinine: 6.7 mg/dL
MDMA: NEGATIVE ng/mL (ref <500)
Marijuana Metabolite: NEGATIVE ng/mL (ref <20)
Noroxycodone: NEGATIVE ng/mL (ref <50)
Opiates: NEGATIVE ng/mL (ref <100)
Oxidant: NEGATIVE ug/mL
Oxycodone: NEGATIVE ng/mL (ref <100)
Oxycodone: NEGATIVE ng/mL (ref <50)
Oxymorphone: NEGATIVE ng/mL (ref <50)
Specific Gravity: 1.005 (ref >=1.0)
pH: 6.6 (ref 4.5–9.0)

## 2019-07-29 LAB — DM TEMPLATE

## 2019-07-31 ENCOUNTER — Other Ambulatory Visit: Payer: Self-pay

## 2019-07-31 NOTE — Patient Outreach (Signed)
  [facility]) Care Management Chronic Special Needs Program    07/31/2019  Name: Alexis [name], DOB: 1950/12/13  MRN: [id number]   Alexis [name] is enrolled in a chronic special needs plan.  Telephone call to client for assessment follow up. Unable to reach. HIPAA compliant voice message left with call back phone number.   PLAN; RNCM will attempt 2nd telephone call to client in 2 weeks.   Quinn Plowman RN,BSN,CCM Chronic Care Management Coordinator [facility] [phone]  .

## 2019-07-31 NOTE — Telephone Encounter (Signed)
Patient called in regards to pain mgt.

## 2019-07-31 NOTE — Telephone Encounter (Signed)
Patient wants to know if her meds was approved to be sent in. Please advise oxyCODONE (OXY IR/ROXICODONE) 5 MG immediate release tablet [[id number]]

## 2019-08-01 ENCOUNTER — Ambulatory Visit: Payer: Self-pay

## 2019-08-01 ENCOUNTER — Encounter: Payer: Self-pay | Admitting: Medical

## 2019-08-01 NOTE — Telephone Encounter (Signed)
Our UDS protocol/ lab drop-down  has changed so she has an update to UDS but no update to date contract

## 2019-08-02 ENCOUNTER — Encounter: Payer: Self-pay | Admitting: Medical

## 2019-08-02 DIAGNOSIS — E1142 Type 2 diabetes mellitus with diabetic polyneuropathy: Secondary | ICD-10-CM | POA: Diagnosis not present

## 2019-08-02 DIAGNOSIS — I10 Essential (primary) hypertension: Secondary | ICD-10-CM | POA: Diagnosis not present

## 2019-08-02 DIAGNOSIS — Z794 Long term (current) use of insulin: Secondary | ICD-10-CM | POA: Diagnosis not present

## 2019-08-02 NOTE — Telephone Encounter (Signed)
Pt control contract is update to as of today

## 2019-08-03 ENCOUNTER — Telehealth: Payer: Self-pay | Admitting: Medical

## 2019-08-03 MED ORDER — OXYCODONE HCL 5 MG PO TABS: ORAL_TABLET | ORAL | 0 refills | Status: DC

## 2019-08-03 NOTE — Telephone Encounter (Signed)
Rx oxycodone sent to pt pharmacy.

## 2019-08-03 NOTE — Telephone Encounter (Signed)
Caller : [place]  Call Back # [phone]  Per Jackelyn Poling, Patient wasn't at home this morning for visit. Today would have been her last visit, patient is up for  re-certification. Per Jackelyn Poling, she will try to stop by patient house tomorrow but not guarantee.

## 2019-08-04 DIAGNOSIS — I110 Hypertensive heart disease with heart failure: Secondary | ICD-10-CM | POA: Diagnosis not present

## 2019-08-04 DIAGNOSIS — S31109D Unspecified open wound of abdominal wall, unspecified quadrant without penetration into peritoneal cavity, subsequent encounter: Secondary | ICD-10-CM | POA: Diagnosis not present

## 2019-08-04 DIAGNOSIS — Z794 Long term (current) use of insulin: Secondary | ICD-10-CM | POA: Diagnosis not present

## 2019-08-04 DIAGNOSIS — I4891 Unspecified atrial fibrillation: Secondary | ICD-10-CM | POA: Diagnosis not present

## 2019-08-04 DIAGNOSIS — I509 Heart failure, unspecified: Secondary | ICD-10-CM | POA: Diagnosis not present

## 2019-08-04 DIAGNOSIS — G609 Hereditary and idiopathic neuropathy, unspecified: Secondary | ICD-10-CM | POA: Diagnosis not present

## 2019-08-04 DIAGNOSIS — E11621 Type 2 diabetes mellitus with foot ulcer: Secondary | ICD-10-CM | POA: Diagnosis not present

## 2019-08-04 DIAGNOSIS — L97513 Non-pressure chronic ulcer of other part of right foot with necrosis of muscle: Secondary | ICD-10-CM | POA: Diagnosis not present

## 2019-08-04 DIAGNOSIS — L97523 Non-pressure chronic ulcer of other part of left foot with necrosis of muscle: Secondary | ICD-10-CM | POA: Diagnosis not present

## 2019-08-06 ENCOUNTER — Other Ambulatory Visit: Payer: Self-pay | Admitting: Medical

## 2019-08-06 ENCOUNTER — Other Ambulatory Visit: Payer: Self-pay | Admitting: Cardiology

## 2019-08-06 DIAGNOSIS — M109 Gout, unspecified: Secondary | ICD-10-CM

## 2019-08-09 ENCOUNTER — Ambulatory Visit: Payer: HMO

## 2019-08-09 ENCOUNTER — Encounter: Payer: Self-pay | Admitting: Podiatry

## 2019-08-09 ENCOUNTER — Ambulatory Visit: Payer: Self-pay

## 2019-08-09 NOTE — Telephone Encounter (Signed)
Dr. Jacqualyn Posey through [facility] states, "Ok to continue the calcium alginate every other day as she has been doing. Thanks." Faxed orders to [facility].

## 2019-08-10 ENCOUNTER — Other Ambulatory Visit: Payer: Self-pay

## 2019-08-10 NOTE — Patient Outreach (Signed)
[facility]) Care Management Chronic Special Needs Program  08/10/2019  Name: Alexis [name] DOB: 06-03-1950  MRN: [id number]  Ms. Fidencia Mccloud is enrolled in a chronic special needs plan for Diabetes. Telephone call to client for assessment follow up. HIPAA verified. Client states he is doing well. She confirms having a recent follow up visit with her endocrinologist. She states he increased her Tresiba to 14 units and she continues on a sliding scale. Client reports today's fasting blood sugar was 123.  Client states she continues to follow up with her foot doctor every two weeks and her wounds to right foot are healing well.      Goals Addressed              This Visit's Progress   .  ":Client states she wants to continue work on getting her A1c down." (pt-stated)   On track     Re- discussed diabetes self management actions:  Glucose monitoring per provider recommendation  Visit provider every 3-6 months as directed  Hbg A1C level every 3-6 months.  Carbohydrate controlled meal planning  Taking diabetes medication as prescribed by provider  Increase Physical activity as tolerated and as directed by your doctor.   Review your Health [facility] calendar for Diabetes action plan    .  A1c goal <7%   On track     Per client recent Hgb A1c is 7.4 on 08/02/19  Discussed diabetes self management actions:  Glucose monitoring per provider recommendation  Visit provider every 3-6 months as directed  Hbg A1C level every 3-6 months.  Carbohydrate controlled meal planning  Taking diabetes medication as prescribed by provider  Physical activity     .  Client verbalize knowledge of Heart Failure disease self management skills by 02/16/19   On track     Continue to attend doctor appointments RN case manager will send client education article: Heart Failure: When to call your doctor or 911.   Advised to notify doctor for mild to moderate symptoms.    Call 911 for severe symptoms Continue with low salt diet.  Review Congestive heart failure: Action plan in [facility] calendar      .  COMPLETED: Client verbalizes knowledge of Heart Attack self management skills by 04/30/19        Client verbalizes understanding of heart attack self management skills           .  Client will increase activity tolerance within 3 months   On track     Client reports she continues to use cane, wheelchair, and scooter to assist with ambulation Continue to do home exercises as advised by physical therapy.  Increase activity as tolerated and advised by doctor.     .  COMPLETED: Client will report no worsening of symptoms of Atrial Fibrillation within the next 3 months        Client denies any worsening symptoms of Atrial Fibrillation.        .  COMPLETED: Client will report no worsening of symptoms related to heart disease within the next 3 months        Client denies symptoms related to heart disease .     Marland Kitchen  Client will report ongoing improvement in right foot wounds in 3 months.        Continue to monitor right foot wounds for signs/ symptoms of infection:  Redness Drainage Swelling Running a fever Odor Report any signs/ symptoms of infection to your doctor.  Wear appropriate fitting shoes as advised by doctor.     .  Client will verbalize knowledge of chronic lung disease as evidenced by no ED visits or Inpatient stays related to chronic lung disease    On track     Client denies having any emergency department or hospitalizations related to chronic lung conditions.  Contact your provider for any signs/ symptoms of mild shortness of breath.  Continue to take your medications as prescribed.         .  COMPLETED: Client will verbalize knowledge of self management of Hypertension as evidences by BP reading of 140/90 or less; or as defined by provider        Blood pressure reading at endocrinology visit on 08/02/19 was 120/72 Client reports  using smart watch to monitor blood pressure and reports results to provider. Client reports taking her medication as prescribed.      .  COMPLETED: Client will verbalize understanding of treatment plan for impaired skin integrity and follow up with provider by 04/30/18        Client reports ongoing follow up from Home health for dressing changes.  Client reports understanding of signs/ symptoms of infection.  Per chart review client has follow up with her provider on: 07/24/19, 07/03/19 and 06/06/19      .  COMPLETED: General - Client will not be readmitted within 30 days ([facility])        Client denies any hospital readmissions.     Marland Kitchen  HEMOGLOBIN A1C < 7        Per client recent A1c is 7.4 on 08/02/19 Re- Discussed diabetes self management actions:  Glucose monitoring per provider recommendation  Visit provider every 3-6 months as directed  Hbg A1C level every 3-6 months.  Carbohydrate controlled meal planning  Taking diabetes medication as prescribed by provider  Physical activity     .  Maintain timely refills of diabetic medication as prescribed within the year .   On track     Client reports taking her diabetic medication as prescribed.  Chart review indicates client maintains timely refills of diabetic medication.  Continue to take your medications as prescribe.  Contact your RN case manager if you have difficulty obtaining your medications.     .  Obtain annual  Lipid Profile, LDL-C   On track     Lipid completed 05/10/18 Continue to adhere to low carb/diabetic, heart healthy, low salt diet.     .  COMPLETED: Obtain Annual Eye (retinal)  Exam         Annual eye exam 06/14/19.  Client reports she has received her new glasses.      .  COMPLETED: Obtain Annual Foot Exam        Annual foot exam completed 04/24/19     .  Obtain annual screen for micro albuminuria (urine) , nephropathy (kidney problems)   On track     No recent microalbumin noted in chart It is important for your  doctor to check your urine at least once a year. These tests show how your kidney's are working. Discuss this with your doctor at your next visit.         Marland Kitchen  Pharmacy Care Plan   On track     Current Barriers:  . Chronic Disease Management support, education, and care coordination needs related to DM, CHF, PVD, CHF, AFib, HTN, Hypothyroid, Osteoarthritis, Gout, HLD  Pharmacist Clinical Goal(s):  Marland Kitchen A1c goal <7%  Interventions: .  Patient scheduled with Endocrinology for 04/30/19 . Patient to get set up with V-go or basal insulin regimen . Initial visit rescheduled for completion  Patient Self Care Activities:  . Patient verbalizes understanding of plan to follow as described above, Self administers medications as prescribed, Calls pharmacy for medication refills, and Calls provider office for new concerns or questions  Initial goal documentation     .  Weight < 200 lb (90.719 kg)        Client states she weighs daily and records the weight, she reports current weight is 207 lbs Client reports losing 81 lbs since last year (2020). Client reports her current weight fluctuates between 178 - 180 lbs.        Plan:  Send successful outreach letter with a copy of their individualized care plan, Send individual care plan to provider and Send educational material  Chronic care management coordinator will outreach in:  3 Months      [place] Case Manager, [facility]   .

## 2019-08-12 ENCOUNTER — Encounter: Payer: Self-pay | Admitting: Medical

## 2019-08-13 ENCOUNTER — Other Ambulatory Visit: Payer: Self-pay

## 2019-08-13 ENCOUNTER — Ambulatory Visit (INDEPENDENT_AMBULATORY_CARE_PROVIDER_SITE_OTHER): Payer: HMO | Admitting: Podiatry

## 2019-08-13 DIAGNOSIS — L97511 Non-pressure chronic ulcer of other part of right foot limited to breakdown of skin: Secondary | ICD-10-CM | POA: Diagnosis not present

## 2019-08-13 DIAGNOSIS — I96 Gangrene, not elsewhere classified: Secondary | ICD-10-CM | POA: Diagnosis not present

## 2019-08-13 DIAGNOSIS — L97411 Non-pressure chronic ulcer of right heel and midfoot limited to breakdown of skin: Secondary | ICD-10-CM

## 2019-08-13 DIAGNOSIS — I739 Peripheral vascular disease, unspecified: Secondary | ICD-10-CM | POA: Diagnosis not present

## 2019-08-14 ENCOUNTER — Ambulatory Visit (INDEPENDENT_AMBULATORY_CARE_PROVIDER_SITE_OTHER): Payer: HMO | Admitting: Gastroenterology

## 2019-08-14 DIAGNOSIS — Z23 Encounter for immunization: Secondary | ICD-10-CM | POA: Diagnosis not present

## 2019-08-15 ENCOUNTER — Telehealth (INDEPENDENT_AMBULATORY_CARE_PROVIDER_SITE_OTHER): Payer: HMO | Admitting: Medical

## 2019-08-15 ENCOUNTER — Encounter: Payer: Self-pay | Admitting: Medical

## 2019-08-15 VITALS — BP 113/80 | HR 65 | Temp 97.6°F

## 2019-08-15 DIAGNOSIS — R300 Dysuria: Secondary | ICD-10-CM

## 2019-08-15 DIAGNOSIS — R350 Frequency of micturition: Secondary | ICD-10-CM | POA: Diagnosis not present

## 2019-08-15 LAB — POC URINALSYSI DIPSTICK (AUTOMATED)
Bilirubin, UA: NEGATIVE
Blood, UA: NEGATIVE
Glucose, UA: NEGATIVE
Ketones, UA: NEGATIVE
Nitrite, UA: NEGATIVE
Protein, UA: NEGATIVE
Spec Grav, UA: 1.015 (ref 1.010–1.025)
Urobilinogen, UA: 0.2 E.U./dL
pH, UA: 6.5 (ref 5.0–8.0)

## 2019-08-15 MED ORDER — NITROFURANTOIN MONOHYD MACRO 100 MG PO CAPS: 100.0000 mg | ORAL_CAPSULE | Freq: Two times a day (BID) | ORAL | 0 refills | Status: DC

## 2019-08-15 NOTE — Progress Notes (Signed)
Subjective:    Patient ID: Alexis [name], female    DOB: [birth year], 69 y.o.   MRN: [id number]  HPI   Talked to pt initially on phone since virtual failed. But she need to come in for urine sample so talked with her and did exam.  Temp 97.6 pulse 65, bp 113/80  History of Present Illness: Pt in today reporting urinary symptoms for 5 days. Pt took azostandard for about 5 days.  Dysuria- yes Frequent urination-some slight increase in urination. Hesitancy- no Suprapubic pressure-yes Fever-no chills-no Nausea-no Vomiting-no CVA pain- History of UTI-not chronic. Gross hematuria-no  Has felt tired today.  Review of Systems  Constitutional: Positive for fatigue. Negative for chills and fever.       Mild today.  Respiratory: Negative for cough, chest tightness, shortness of breath and wheezing.   Cardiovascular: Negative for chest pain and palpitations.  Gastrointestinal: Negative for abdominal distention, abdominal pain, constipation, diarrhea and nausea.  Genitourinary: Positive for dysuria and frequency. Negative for decreased urine volume and urgency.  Skin: Negative for rash.  Neurological: Negative for dizziness and headaches.  Hematological: Negative for adenopathy. Does not bruise/bleed easily.  Psychiatric/Behavioral: Negative for behavioral problems and confusion.   Past Medical History:  Diagnosis Date  . Allergy   . Anxiety   . Arthritis   . Asthma   . Atrial fibrillation ([place])   . CHF (congestive heart failure) ([place])   . Cirrhosis ([place])   . COPD (chronic obstructive pulmonary disease) ([place])   . Depression 05/26/2019  . Diabetes mellitus without complication ([place])   . Hyperlipidemia   . Hypertension   . Macrocytic anemia 09/04/2018  . Myocardial infarction ([place])    several  . Pacemaker    CRT-P with RV lead and His Bundle lead ( high threshold)   . Peripheral neuropathy   . Pneumonia   . PONV (postoperative nausea and vomiting)    unsure of exactly  what happened at [facility] in 2010 (knee surgery) that led to crital care  . Thyroid disease      Social History   Socioeconomic History  . Marital status: Married    Spouse name: Juanda Crumble  . Number of children: 1  . Years of education: Not on file  . Highest education level: Not on file  Occupational History  . 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 Smoking status: Former Smoker    Types: Cigarettes    Quit date: 03/01/1998    Years since quitting: 21.4  . Smokeless tobacco: Never Used  Vaping Use  . Vaping Use: Never used  Substance and Sexual Activity  . 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  . Drug use: Not Currently    Types: Marijuana    Comment: remote use  . Sexual activity: Never  Other Topics Concern  . 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 Difficulty of Paying Living Expenses: Patient refused  Food Insecurity: No Food Insecurity  . Worried About Charity fundraiser  in the Last Year: Never true  . Ran Out of Food in the Last Year: Never true  Transportation Needs: No Transportation Needs  . Lack of Transportation (Medical): No  . Lack of Transportation (Non-Medical): No  Physical Activity: Unknown  . Days of Exercise per Week: 0 days  . Minutes of Exercise per Session: Not on file  Stress: No Stress Concern Present  . Feeling of Stress : Only a little  Social Connections: Unknown  . Frequency of Communication with Friends and Family: More than three times a week  . Frequency of Social Gatherings with Friends and Family: Not  on file  . Attends Religious Services: Not on file  . Active Member of Clubs or Organizations: Not on file  . Attends Archivist Meetings: Not on file  . Marital Status: Married  Human resources officer Violence:   . Fear of Current or Ex-Partner:   . Emotionally Abused:   Marland Kitchen Physically Abused:   . Sexually Abused:     Past Surgical History:  Procedure Laterality Date  . ABDOMINAL AORTOGRAM W/LOWER EXTREMITY N/A 04/29/2016   Procedure: Abdominal Aortogram w/Lower Extremity;  Surgeon: Waynetta Sandy, MD;  Location: [facility];  Service: Cardiovascular;  Laterality: N/A;  . ABDOMINAL AORTOGRAM W/LOWER EXTREMITY N/A 05/06/2017   Procedure: ABDOMINAL AORTOGRAM W/LOWER EXTREMITY;  Surgeon: Angelia Mould, MD;  Location: [facility];  Service: Cardiovascular;  Laterality: N/A;  bilateral  . ABDOMINAL AORTOGRAM W/LOWER EXTREMITY Bilateral 05/04/2019   Procedure: ABDOMINAL AORTOGRAM W/LOWER EXTREMITY;  Surgeon: Angelia Mould, MD;  Location: [facility];  Service: Cardiovascular;  Laterality: Bilateral;  . ABDOMINAL HYSTERECTOMY    . AMPUTATION Right 07/30/2016   Procedure: AMPUTATION RIGHT SECOND TOE;  Surgeon: Angelia Mould, MD;  Location: [facility];  Service: Vascular;  Laterality: Right;  . CARDIAC CATHETERIZATION     2005 at [place] N/A 12/25/2018   Procedure: COLONOSCOPY WITH PROPOFOL;  Surgeon: Jackquline Denmark, MD;  Location: [facility];  Service: Endoscopy;  Laterality: N/A;  . ESOPHAGEAL BANDING  12/25/2018   Procedure: ESOPHAGEAL BANDING;  Surgeon: Jackquline Denmark, MD;  Location: [facility];  Service: Endoscopy;;  . ESOPHAGEAL BANDING  05/01/2019   Procedure: ESOPHAGEAL BANDING;  Surgeon: Jackquline Denmark, MD;  Location: [facility];  Service: Endoscopy;;  . ESOPHAGOGASTRODUODENOSCOPY (EGD) WITH PROPOFOL N/A 12/25/2018   Procedure: ESOPHAGOGASTRODUODENOSCOPY (EGD) WITH PROPOFOL;  Surgeon: Jackquline Denmark, MD;  Location: [facility];  Service: Endoscopy;  Laterality: N/A;  . ESOPHAGOGASTRODUODENOSCOPY (EGD) WITH PROPOFOL N/A 05/01/2019   Procedure: ESOPHAGOGASTRODUODENOSCOPY (EGD) WITH PROPOFOL;  Surgeon: Jackquline Denmark, MD;  Location: [facility];  Service: Endoscopy;  Laterality: N/A;  . LOWER EXTREMITY ANGIOGRAPHY Right 05/14/2019   Procedure: LOWER EXTREMITY ANGIOGRAPHY;  Surgeon: Waynetta Sandy, MD;  Location: [facility];  Service: Cardiovascular;  Laterality: Right;  . PERIPHERAL VASCULAR ATHERECTOMY Right 04/29/2016   Procedure: Peripheral Vascular Atherectomy-Right Popliteal;  Surgeon: Waynetta Sandy, MD;  Location: [facility];  Service: Cardiovascular;  Laterality: Right;  . PERIPHERAL VASCULAR ATHERECTOMY Right 05/14/2019   Procedure: PERIPHERAL VASCULAR ATHERECTOMY;  Surgeon: Waynetta Sandy, MD;  Location: [facility];  Service: Cardiovascular;  Laterality: Right;  right SFA/pop  . PERIPHERAL VASCULAR INTERVENTION Right 04/29/2016   Procedure: Peripheral Vascular Intervention-Right Popliteal;  Surgeon: Waynetta Sandy, MD;  Location: [facility];  Service: Cardiovascular;  Laterality: Right;  POPLITEAL PTA  . PERIPHERAL VASCULAR  INTERVENTION Right 05/14/2019   Procedure: PERIPHERAL VASCULAR INTERVENTION;  Surgeon: Waynetta Sandy, MD;  Location: [facility];  Service: Cardiovascular;  Laterality: Right;  popliteal stent  . RADIOFREQUENCY ABLATION    . TOTAL KNEE ARTHROPLASTY    . TUBAL LIGATION      Family History  Problem Relation Age of Onset  . Diabetes Mother   . Hyperlipidemia Mother   . Diabetes Father   . Hyperlipidemia Father   . Heart disease Father        before age 34  . Heart attack Father   . Diabetes Brother   . Hyperlipidemia Brother   . Heart attack Brother     Allergies  Allergen Reactions  . Indomethacin Other (See Comments)    Renal Insufficiency  . Pregabalin Other  (See Comments)    DIZZINESS   . Sulfa Antibiotics Hives  . Morphine And Related Nausea And Vomiting    Current Outpatient Medications on File Prior to Visit  Medication Sig Dispense Refill  . albuterol (VENTOLIN HFA) 108 (90 Base) MCG/ACT inhaler Inhale 2 puffs into the lungs every 6 (six) hours as needed for wheezing or shortness of breath.    . allopurinol (ZYLOPRIM) 100 MG tablet TAKE ONE TABLET BY MOUTH TWICE DAILY 60 tablet 3  . AMBULATORY NON FORMULARY MEDICATION Motorized scooter.  Diagnosis: Osteoarthritis M19.90 1 each 0  . atorvastatin (LIPITOR) 10 MG tablet Take 1 tablet (10 mg total) by mouth daily. 30 tablet 11  . cephALEXin (KEFLEX) 500 MG capsule Take 500 mg by mouth 2 (two) times daily.    . clopidogrel (PLAVIX) 75 MG tablet Take 1 tablet (75 mg total) by mouth daily. 30 tablet 11  . Continuous Blood Gluc Receiver (FREESTYLE LIBRE 14 DAY READER) DEVI Use as directed to monitor BG.  Dx E11.65    . Continuous Blood Gluc Sensor (FREESTYLE LIBRE 14 DAY SENSOR) MISC INJECT INTO THE SKIN EVERY 14 DAYS    . dexamethasone (DECADRON) 0.75 MG tablet TAKE 2 TABLETS BY MOUTH EVERY MORNING FOR 5 DAYS THEN 1 TABLET EVERY MORNING FOR 5 DAYS    . doxycycline (VIBRA-TABS) 100 MG tablet Take 1 tablet (100 mg total) by mouth 2 (two) times daily. 20 tablet 0  . fluticasone (FLONASE) 50 MCG/ACT nasal spray Place 2 sprays into both nostrils at bedtime. 16 g 3  . furosemide (LASIX) 20 MG tablet TAKE 2 TABLETS BY MOUTH EVERY MORNING AND 1 TABLET AT NOON 270 tablet 1  . Insulin Aspart FlexPen 100 UNIT/ML SOPN Inject 0-10 Units into the skin as needed (If glucose if over 200). Sliding scale    . insulin degludec (TRESIBA) 100 UNIT/ML FlexTouch Pen Inject 10 Units into the skin daily.     . Insulin Pen Needle (PEN NEEDLES 31GX5/16") 31G X 8 MM MISC Use as needed 4 times/day.  Dx E11.65    . levocetirizine (XYZAL) 5 MG tablet TAKE 1 TABLET BY MOUTH EVERY EVENING (Patient taking differently: Take 5 mg by  mouth at bedtime. ) 30 tablet 2  . levothyroxine (SYNTHROID) 150 MCG tablet Take 150 mcg by mouth every morning.    . Lidocaine HCl 4 % LIQD Apply 10 mLs topically 2 (two) times daily. (Patient taking differently: Apply 10 mLs topically 2 (two) times daily as needed (pain). ) 73 mL 0  . losartan (COZAAR) 25 MG tablet TAKE ONE (1) TABLET BY MOUTH EVERY DAY 90 tablet 1  . metoprolol succinate (TOPROL-XL) 25 MG 24 hr  tablet TAKE ONE (1) TABLET BY MOUTH EVERY DAY (Patient taking differently: Take 25 mg by mouth daily. ) 90 tablet 1  . oxyCODONE (OXY IR/ROXICODONE) 5 MG immediate release tablet 1 tab po daily as needed for severe pain 30 tablet 0  . Prenatal Vit-Fe Fumarate-FA (MULTIVITAMIN-PRENATAL) 27-0.8 MG TABS tablet Take 1 tablet by mouth daily.     . Probiotic CAPS Take 2 capsules by mouth daily. 50 billion    . RABEprazole (ACIPHEX) 20 MG tablet Take 1 tablet (20 mg total) by mouth daily. 30 tablet 1  . sertraline (ZOLOFT) 50 MG tablet Take 1 tablet (50 mg total) by mouth daily. 30 tablet 3  . spironolactone (ALDACTONE) 50 MG tablet Take 1 tablet (50 mg total) by mouth daily. 30 tablet 11  . venlafaxine (EFFEXOR) 37.5 MG tablet TAKE 1 TABLET BY MOUTH DAILY FOR 1 WEEK THEN 1 TABLET TWICE A DAY     No current facility-administered medications on file prior to visit.    There were no vitals taken for this visit.      Objective:   Physical Exam  General- No acute distress. Pleasant patient. Neck- Full range of motion, no jvd Lungs- Clear, even and unlabored. Heart- regular rate and rhythm. Neurologic- CNII- XII grossly intact. Back- no cva tenderness.  Abdomen- soft, nt, nd, +bs, no suprapubic tenderness.      Assessment & Plan:  You appear to have a urinary tract infection. I am prescribing macrobid antibiotic for the probable infection. Hydrate well. I am sending out a urine culture. During the interim if your signs and symptoms worsen rather than improving please notify us. We  will notify your when the culture results are back.  Follow up in 7 days or as needed.  Time spent with patient today was 25  minutes which consisted of chart review, discussing diagnosis, work up treatment and documentation.  Started note as virtual. But converted to in office since she needed to give urine sample in office anyway.

## 2019-08-15 NOTE — Patient Instructions (Addendum)
You appear to have a urinary tract infection. I am prescribing macrobid antibiotic for the probable infection. Hydrate well. I am sending out a urine culture. During the interim if your signs and symptoms worsen rather than improving please notify us. We will notify your when the culture results are back.  Follow up in 7 days or as needed.

## 2019-08-16 NOTE — Addendum Note (Signed)
Addended by: Kelle Darting A on: 08/16/2019 09:27 AM   Modules accepted: Orders

## 2019-08-18 LAB — URINE CULTURE
MICRO NUMBER:: 10603109
SPECIMEN QUALITY:: ADEQUATE

## 2019-08-19 ENCOUNTER — Telehealth: Payer: Self-pay | Admitting: Medical

## 2019-08-19 MED ORDER — CEPHALEXIN 500 MG PO CAPS
500.0000 mg | ORAL_CAPSULE | Freq: Two times a day (BID) | ORAL | 0 refills | Status: DC
Start: 2019-08-19 — End: 2019-10-05

## 2019-08-19 MED ORDER — CEPHALEXIN 500 MG PO CAPS: 500.0000 mg | ORAL_CAPSULE | Freq: Two times a day (BID) | ORAL | 0 refills | Status: DC

## 2019-08-19 NOTE — Telephone Encounter (Signed)
Rx keflex sent to pt pharmacy.

## 2019-08-20 ENCOUNTER — Telehealth: Payer: Self-pay

## 2019-08-20 NOTE — Telephone Encounter (Signed)
Triage call received from pt c/o cramping in the calf, ankle and toes in addition to aching pain in calf when walking from room to room that resolves with rest and occasionally with an OTC topical pain relief foam x 2 weeks. States she was told to call for appt if she developed any of these symptoms. She denies any other symptoms. Pt will be contacted by scheduling for an ABI and f/u appt with PA. Pt voiced understanding.

## 2019-08-21 NOTE — Telephone Encounter (Signed)
Dr. Zigmund Daniel with [facility] seen pt on yesterday evening and reports her LLE was more dusky and cyanotic, Lt great toe turning dark, foot cool to touch and has claudication pain. She is concerned about new ischemia and inquires if pt can be seen earlier.  Pts symptoms differ from phone conversation on yesterday. Per Arlee Muslim, PA-C if pt symptoms are worsening, should proceed to ER for eval due to pt was scheduled at earliest appt avail on 7/26.  Will also discuss with Dr. Scot Dock in office for further recommendations.

## 2019-08-21 NOTE — Progress Notes (Signed)
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  She states that overall she is doing much better in her big toe.  She denies any swelling.  With some occasional redness the tips of the toes.  Clear drainage intermittently but no warmth.  Denies any pus.  She is been continue with silver alginate dressing daily.  Her last morning blood sugar was 143.  She currently denies any fevers, chills, nausea, vomiting.  No calf pain, chest pain, shortness of breath.   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  There is course of steroids which were not helpful.  Denies any systemic complaints such as fevers, chills, nausea, vomiting. No acute changes since last appointment, and no other complaints at this time.   Objective: AAO x3, NAD See pictures below.  Eschar, scab at the distal aspect of the toe which upon debridement came off and there is a small superficial wound underneath.  Wound present on the medial after the toe with a granular wound base.  Appears to be healthier today as previously spent fibrotic.  There is no probing, undermining or tunneling.  Mild chronic erythema to the distal aspect of the toes bilaterally.  There is no warmth.  No ascending cellulitis.  No fluctuation crepitation.  There is no malodor.  The wound on the medial heel has healed. No open lesions otherwise No pain with calf compression, swelling, warmth, erythema            Assessment: 69 year old female with gangrene/ulceration to the right hallux/heel-evidence of healing  Plan: -All treatment options discussed with the patient including all alternatives, risks, complications.  -Debrided the eschar to the distal aspect of the hallux and complications of chronic wound today.  We will continue with daily dressing changes as she has been doing.  Continue offloading. -Monitor  for any clinical signs or symptoms of infection and directed to call the office immediately should any occur or go to the ER.  Return in about 3 weeks (around 09/03/2019).  Trula Slade DPM

## 2019-08-22 ENCOUNTER — Ambulatory Visit: Payer: HMO | Admitting: Orthotics

## 2019-08-22 ENCOUNTER — Other Ambulatory Visit: Payer: Self-pay

## 2019-08-22 ENCOUNTER — Ambulatory Visit (INDEPENDENT_AMBULATORY_CARE_PROVIDER_SITE_OTHER): Payer: HMO | Admitting: Physician Assistant

## 2019-08-22 ENCOUNTER — Telehealth: Payer: Self-pay | Admitting: Gastroenterology

## 2019-08-22 VITALS — BP 117/57 | HR 80 | Temp 96.9°F | Resp 20 | Ht 62.0 in | Wt 194.3 lb

## 2019-08-22 DIAGNOSIS — I739 Peripheral vascular disease, unspecified: Secondary | ICD-10-CM | POA: Diagnosis not present

## 2019-08-22 NOTE — Progress Notes (Signed)
HISTORY AND PHYSICAL     CC:  follow up. Requesting Provider:  Dr. Zigmund Daniel, [facility]  HPI: This is a 69 y.o. female who is here today for follow up for onset of left lower extremity pain with walking.  She complains of left calf cramping when walking from our lobby to exam room.  She noticed this pain as of Saturday when walking around her swimming pool. She has been staying off her feet for the last 2 days.  She is awakening at night with LLE cramps. She has significant diabetic neuropathy of both feet. Right foot ulcers continue to heal. She denies right lower extremity pain.  She was last seen 06/22/2019 and at that time, she was not having any rest pain or pain on exertion.  At that visit, she had significant improvement of her ABI's.  Her right great toe ulcer had improved and the left heel ulcer was nearly healed.  She was to follow up in 6 months if she had any changes.  She sees Dr. Jacqualyn Posey for wound care.  Aortogram on 05/04/2019: On the left side, there is no significant inflow disease.  The common iliac, external iliac, and hypogastric arteries are patent. On the left side, there is severe infrainguinal arterial occlusive disease.  The common femoral and deep femoral artery are patent.  There is moderate disease of the superficial femoral artery throughout with severe disease distally.  It appears that the below-knee popliteal artery is patent with distal stenosis distally.  The anterior tibial is occluded.  There appears to be single-vessel runoff on the left via the peroneal artery.  She underwent laser atherectomy of the right SFA and popliteal arteries with stent of the right popliteal artery on 05/14/2019 by Dr. Donzetta Matters.  SFA had 16 cm occlusive lesion extending down into the at the knee popliteal artery.  After laser atherectomy we had a channel with one area of dissection this was stented.  We ballooned the stent as well as the SFA with 4 mm balloon.  She then had brisk flow to  the foot via the peroneal.  The anterior tibial artery was occluded after the takeoff did reconstitute.  We did not demonstrate flow via the posterior tibial artery has had a wire and sheath in place completion but I suspect there is good flow there as well.  Where previously there was a 16 cm lesion there is now 0% residual stenosis.  She states she has an very rare skin condition called morphea.  She has painful skin areas of the lower abdomen.  Her groin areas are not involved.   The pt is on a statin for cholesterol management.    The pt is not on an aspirin.    Other AC:  Plavix>>she has not taken her Plavix since April The pt is on BB, ARB for hypertension.  The pt does have diabetes. Tobacco hx:  former  Past Medical History:  Diagnosis Date  . Allergy   . Anxiety   . Arthritis   . Asthma   . Atrial fibrillation ([place])   . CHF (congestive heart failure) ([place])   . Cirrhosis ([place])   . COPD (chronic obstructive pulmonary disease) ([place])   . Depression 05/26/2019  . Diabetes mellitus without complication ([place])   . Hyperlipidemia   . Hypertension   . Macrocytic anemia 09/04/2018  . Myocardial infarction ([place])    several  . Pacemaker    CRT-P with RV lead and His Bundle lead (  high threshold)   . Peripheral neuropathy   . Pneumonia   . PONV (postoperative nausea and vomiting)    unsure of exactly what happened at [facility] in 2010 (knee surgery) that led to crital care  . Thyroid disease     Past Surgical History:  Procedure Laterality Date  . ABDOMINAL AORTOGRAM W/LOWER EXTREMITY N/A 04/29/2016   Procedure: Abdominal Aortogram w/Lower Extremity;  Surgeon: Waynetta Sandy, MD;  Location: [facility];  Service: Cardiovascular;  Laterality: N/A;  . ABDOMINAL AORTOGRAM W/LOWER EXTREMITY N/A 05/06/2017   Procedure: ABDOMINAL AORTOGRAM W/LOWER EXTREMITY;  Surgeon: Angelia Mould, MD;  Location: [facility];  Service: Cardiovascular;  Laterality:  N/A;  bilateral  . ABDOMINAL AORTOGRAM W/LOWER EXTREMITY Bilateral 05/04/2019   Procedure: ABDOMINAL AORTOGRAM W/LOWER EXTREMITY;  Surgeon: Angelia Mould, MD;  Location: [facility];  Service: Cardiovascular;  Laterality: Bilateral;  . ABDOMINAL HYSTERECTOMY    . AMPUTATION Right 07/30/2016   Procedure: AMPUTATION RIGHT SECOND TOE;  Surgeon: Angelia Mould, MD;  Location: [facility];  Service: Vascular;  Laterality: Right;  . CARDIAC CATHETERIZATION     2005 at [place] N/A 12/25/2018   Procedure: COLONOSCOPY WITH PROPOFOL;  Surgeon: Jackquline Denmark, MD;  [facility]: [facility];  Service: Endoscopy;  Laterality: N/A;  . ESOPHAGEAL BANDING  12/25/2018   Procedure: ESOPHAGEAL BANDING;  Surgeon: Jackquline Denmark, MD;  [facility]: [facility];  Service: Endoscopy;;  . ESOPHAGEAL BANDING  05/01/2019   Procedure: ESOPHAGEAL BANDING;  Surgeon: Jackquline Denmark, MD;  [facility];  Service: Endoscopy;;  . ESOPHAGOGASTRODUODENOSCOPY (EGD) WITH PROPOFOL N/A 12/25/2018   Procedure: ESOPHAGOGASTRODUODENOSCOPY (EGD) WITH PROPOFOL;  Surgeon: Jackquline Denmark, MD;  [facility];  Service: Endoscopy;  Laterality: N/A;  . ESOPHAGOGASTRODUODENOSCOPY (EGD) WITH PROPOFOL N/A 05/01/2019   Procedure: ESOPHAGOGASTRODUODENOSCOPY (EGD) WITH PROPOFOL;  Surgeon: Jackquline Denmark, MD;  [facility];  Service: Endoscopy;  Laterality: N/A;  . LOWER EXTREMITY ANGIOGRAPHY Right 05/14/2019   Procedure: LOWER EXTREMITY ANGIOGRAPHY;  Surgeon: Waynetta Sandy, MD;  Location: [facility];  Service: Cardiovascular;  Laterality: Right;  . PERIPHERAL VASCULAR ATHERECTOMY Right 04/29/2016   Procedure: Peripheral Vascular Atherectomy-Right Popliteal;  Surgeon: Waynetta Sandy, MD;  Location: [facility];  Service: Cardiovascular;  Laterality: Right;  . PERIPHERAL VASCULAR ATHERECTOMY Right 05/14/2019   Procedure: PERIPHERAL VASCULAR  ATHERECTOMY;  Surgeon: Waynetta Sandy, MD;  Location: [facility];  Service: Cardiovascular;  Laterality: Right;  right SFA/pop  . PERIPHERAL VASCULAR INTERVENTION Right 04/29/2016   Procedure: Peripheral Vascular Intervention-Right Popliteal;  Surgeon: Waynetta Sandy, MD;  Location: [facility];  Service: Cardiovascular;  Laterality: Right;  POPLITEAL PTA  . PERIPHERAL VASCULAR INTERVENTION Right 05/14/2019   Procedure: PERIPHERAL VASCULAR INTERVENTION;  Surgeon: Waynetta Sandy, MD;  Location: [facility];  Service: Cardiovascular;  Laterality: Right;  popliteal stent  . RADIOFREQUENCY ABLATION    . TOTAL KNEE ARTHROPLASTY    . TUBAL LIGATION      Allergies  Allergen Reactions  . Indomethacin Other (See Comments)    Renal Insufficiency  . Pregabalin Other (See Comments)    DIZZINESS   . Sulfa Antibiotics Hives  . Morphine And Related Nausea And Vomiting    Current Outpatient Medications  Medication Sig Dispense Refill  . albuterol (VENTOLIN HFA) 108 (90 Base) MCG/ACT inhaler Inhale 2 puffs into the lungs every 6 (six) hours as needed for  wheezing or shortness of breath.    . allopurinol (ZYLOPRIM) 100 MG tablet TAKE ONE TABLET BY MOUTH TWICE DAILY 60 tablet 3  . AMBULATORY NON FORMULARY MEDICATION Motorized scooter.  Diagnosis: Osteoarthritis M19.90 1 each 0  . atorvastatin (LIPITOR) 10 MG tablet Take 1 tablet (10 mg total) by mouth daily. 30 tablet 11  . cephALEXin (KEFLEX) 500 MG capsule Take 1 capsule (500 mg total) by mouth 2 (two) times daily. 20 capsule 0  . cephALEXin (KEFLEX) 500 MG capsule Take 1 capsule (500 mg total) by mouth 2 (two) times daily. 14 capsule 0  . clopidogrel (PLAVIX) 75 MG tablet Take 1 tablet (75 mg total) by mouth daily. 30 tablet 11  . Continuous Blood Gluc Receiver (FREESTYLE LIBRE 14 DAY READER) DEVI Use as directed to monitor BG.  Dx E11.65    . Continuous Blood Gluc Sensor (FREESTYLE LIBRE 14 DAY  SENSOR) MISC INJECT INTO THE SKIN EVERY 14 DAYS    . dexamethasone (DECADRON) 0.75 MG tablet TAKE 2 TABLETS BY MOUTH EVERY MORNING FOR 5 DAYS THEN 1 TABLET EVERY MORNING FOR 5 DAYS    . fluticasone (FLONASE) 50 MCG/ACT nasal spray Place 2 sprays into both nostrils at bedtime. 16 g 3  . furosemide (LASIX) 20 MG tablet TAKE 2 TABLETS BY MOUTH EVERY MORNING AND 1 TABLET AT NOON 270 tablet 1  . Insulin Aspart FlexPen 100 UNIT/ML SOPN Inject 0-10 Units into the skin as needed (If glucose if over 200). Sliding scale    . insulin degludec (TRESIBA) 100 UNIT/ML FlexTouch Pen Inject 10 Units into the skin daily.     . Insulin Pen Needle (PEN NEEDLES 31GX5/16") 31G X 8 MM MISC Use as needed 4 times/day.  Dx E11.65    . levocetirizine (XYZAL) 5 MG tablet TAKE 1 TABLET BY MOUTH EVERY EVENING (Patient taking differently: Take 5 mg by mouth at bedtime. ) 30 tablet 2  . levothyroxine (SYNTHROID) 150 MCG tablet Take 150 mcg by mouth every morning.    . Lidocaine HCl 4 % LIQD Apply 10 mLs topically 2 (two) times daily. (Patient taking differently: Apply 10 mLs topically 2 (two) times daily as needed (pain). ) 73 mL 0  . losartan (COZAAR) 25 MG tablet TAKE ONE (1) TABLET BY MOUTH EVERY DAY 90 tablet 1  . metoprolol succinate (TOPROL-XL) 25 MG 24 hr tablet TAKE ONE (1) TABLET BY MOUTH EVERY DAY (Patient taking differently: Take 25 mg by mouth daily. ) 90 tablet 1  . oxyCODONE (OXY IR/ROXICODONE) 5 MG immediate release tablet 1 tab po daily as needed for severe pain 30 tablet 0  . Prenatal Vit-Fe Fumarate-FA (MULTIVITAMIN-PRENATAL) 27-0.8 MG TABS tablet Take 1 tablet by mouth daily.     . Probiotic CAPS Take 2 capsules by mouth daily. 50 billion    . RABEprazole (ACIPHEX) 20 MG tablet Take 1 tablet (20 mg total) by mouth daily. 30 tablet 1  . sertraline (ZOLOFT) 50 MG tablet Take 1 tablet (50 mg total) by mouth daily. 30 tablet 3  . spironolactone (ALDACTONE) 50 MG tablet Take 1 tablet (50 mg total) by mouth daily. 30  tablet 11  . venlafaxine (EFFEXOR) 37.5 MG tablet TAKE 1 TABLET BY MOUTH DAILY FOR 1 WEEK THEN 1 TABLET TWICE A DAY     No current facility-administered medications for this visit.    Family History  Problem Relation Age of Onset  . Diabetes Mother   . Hyperlipidemia Mother   . Diabetes Father   .  Hyperlipidemia Father   . Heart disease Father        before age 72  . Heart attack Father   . Diabetes Brother   . Hyperlipidemia Brother   . Heart attack Brother     Social History   Socioeconomic History  . Marital status: Married    Spouse name: Juanda Crumble  . Number of children: 1  . Years of education: Not on file  . Highest education level: Not on file  Occupational History  . 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 Smoking status: Former Smoker    Types: Cigarettes    Quit date: 03/01/1998    Years since quitting: 21.4  . Smokeless tobacco: Never Used  Vaping Use  . Vaping Use: Never used  Substance and Sexual Activity  . 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  . Drug use: Not Currently    Types: Marijuana    Comment: remote use  . Sexual activity: Never  Other Topics Concern  . 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 Difficulty of Paying Living Expenses: Patient refused  Food Insecurity: No Food Insecurity  . Worried About Charity fundraiser in the Last Year: Never true  . Ran Out of Food in the Last Year: Never true  Transportation Needs: No Transportation Needs  . Lack of Transportation (Medical): No  . Lack of Transportation  (Non-Medical): No  Physical Activity: Unknown  . Days of Exercise per Week: 0 days  . Minutes of Exercise per Session: Not on file  Stress: No Stress Concern Present  . Feeling of Stress : Only a little  Social Connections: Unknown  . Frequency of Communication with Friends and Family: More than three times a week  . Frequency of Social Gatherings with Friends and Family: Not on file  . Attends Religious Services: Not on file  . Active Member of Clubs or Organizations: Not on file  . Attends Archivist Meetings: Not on file  . Marital Status: Married  Human resources officer Violence:   . Fear of Current or Ex-Partner:   . Emotionally Abused:   Marland Kitchen Physically Abused:   . Sexually Abused:      REVIEW OF SYSTEMS:   [X]  denotes positive finding, [ ]  denotes negative finding Cardiac  Comments:  Chest pain or chest pressure:    Shortness of breath upon exertion:    Short of breath when lying flat:    Irregular heart rhythm:        Vascular    Pain in calf, thigh, or hip brought on by ambulation: x   Pain in feet at night that wakes you up from your sleep:  x   Blood clot in your veins:    Leg swelling:         Pulmonary    Oxygen at home:    Productive cough:     Wheezing:         Neurologic    Sudden weakness in arms or legs:  Sudden numbness in arms or legs:     Sudden onset of difficulty speaking or slurred speech:    Temporary loss of vision in one eye:     Problems with dizziness:         Gastrointestinal    Blood in stool:     Vomited blood:         Genitourinary    Burning when urinating:     Blood in urine:        Psychiatric    Major depression:         Hematologic    Bleeding problems:    Problems with blood clotting too easily:        Skin    Rashes or ulcers: x       Constitutional    Fever or chills:      PHYSICAL EXAMINATION:  Vitals:   08/22/19 1419  BP: (!) 117/57  Pulse: 80  Resp: 20  Temp: (!) 96.9 F (36.1 C)  SpO2: 97%      General:  WDWN in NAD; vital signs documented above Gait: Steady with cane HENT: WNL, normocephalic Pulmonary: normal non-labored breathing , without wheezing Cardiac: irregular HR, without  Murmur;  Abdomen: soft, NT,bandanges over areas of lower abdomen Skin: without rashes Vascular Exam/Pulses: Doppler signals are monophasic bilaterally of the PT, DP and peroneal arteries. Extremities: with chronic ischemic changes, without Gangrene , without cellulitis; without open wounds; Small punctate dark blister of 2nd toe Musculoskeletal: no muscle wasting or atrophy  Neurologic: A&O X 3;  No focal weakness or paresthesias are detected Psychiatric:  The pt has Normal affect.   Right heel   Right great toe     Non-Invasive Vascular Imaging:   ABI's/TBI's None today  Previous ABI's/TBI's on 06/22/2019: Right:  0.90/bandage - Great toe pressure: not done Left:  0.50/0.0 - Great toe pressure:  absent  Previous arterial duplex on 06/22/2019: +----------+--------+-----+--------+----------+--------+  RIGHT   PSV cm/sRatioStenosisWaveform Comments  +----------+--------+-----+--------+----------+--------+  CFA Distal199          biphasic       +----------+--------+-----+--------+----------+--------+  SFA Prox 139          biphasic       +----------+--------+-----+--------+----------+--------+  SFA Mid  135          biphasic       +----------+--------+-----+--------+----------+--------+  SFA Distal76          biphasic       +----------+--------+-----+--------+----------+--------+  POP Prox 76          monophasic      +----------+--------+-----+--------+----------+--------+  POP Mid  84          biphasic       +----------+--------+-----+--------+----------+--------+  POP Distal136          biphasic        +----------+--------+-----+--------+----------+--------+   Right Stent(s):  +---------------+--------+--------+--------+--------+  popliteal   PSV cm/sStenosisWaveformComments  +---------------+--------+--------+--------+--------+  Prox to Stent 82       biphasic      +---------------+--------+--------+--------+--------+  Proximal Stent 61       biphasic      +---------------+--------+--------+--------+--------+  Mid Stent   81       biphasic      +---------------+--------+--------+--------+--------+  Distal Stent  83       biphasic      +---------------+--------+--------+--------+--------+  Distal to Stent114       biphasic        ASSESSMENT/PLAN:: 69 y.o. female here for LLE claudication with  known history of left infrainguinal occlusive PAD.  Will arrange arteriography with possible LLE intervention. I explained she needs to continue Plavix as arranged, continue statin, antihypertensives, good glucose control.    Right lower extremity wounds continue to heal.  Barbie Banner, PA-C [facility] [phone]  Clinic MD:   Scot Dock

## 2019-08-22 NOTE — Telephone Encounter (Signed)
Patient advised she is 7th on the waiting list.

## 2019-08-23 ENCOUNTER — Other Ambulatory Visit: Payer: Self-pay

## 2019-08-24 ENCOUNTER — Encounter: Payer: Self-pay | Admitting: Medical

## 2019-08-25 ENCOUNTER — Other Ambulatory Visit (HOSPITAL_COMMUNITY)
Admission: RE | Admit: 2019-08-25 | Discharge: 2019-08-25 | Disposition: A | Discharge status: Home / Self Care | Payer: HMO | Source: Ambulatory Visit | Attending: Vascular Surgery | Admitting: Vascular Surgery

## 2019-08-25 DIAGNOSIS — Z01812 Encounter for preprocedural laboratory examination: Secondary | ICD-10-CM | POA: Insufficient documentation

## 2019-08-25 DIAGNOSIS — Z20822 Contact with and (suspected) exposure to covid-19: Secondary | ICD-10-CM | POA: Diagnosis not present

## 2019-08-25 LAB — SARS CORONAVIRUS 2 (TAT 6-24 HRS): SARS Coronavirus 2: NEGATIVE

## 2019-08-27 ENCOUNTER — Encounter: Payer: Self-pay | Admitting: Medical

## 2019-08-27 ENCOUNTER — Ambulatory Visit (HOSPITAL_COMMUNITY)
Admission: RE | Admit: 2019-08-27 | Discharge: 2019-08-27 | Disposition: A | Discharge status: Home / Self Care | Payer: HMO | Attending: Vascular Surgery | Admitting: Vascular Surgery

## 2019-08-27 ENCOUNTER — Other Ambulatory Visit: Payer: Self-pay

## 2019-08-27 ENCOUNTER — Encounter (HOSPITAL_COMMUNITY)
Admission: RE | Disposition: A | Discharge status: Home / Self Care | Payer: Self-pay | Source: Home / Self Care | Attending: Vascular Surgery

## 2019-08-27 DIAGNOSIS — Z885 Allergy status to narcotic agent status: Secondary | ICD-10-CM | POA: Diagnosis not present

## 2019-08-27 DIAGNOSIS — Z79899 Other long term (current) drug therapy: Secondary | ICD-10-CM | POA: Insufficient documentation

## 2019-08-27 DIAGNOSIS — E785 Hyperlipidemia, unspecified: Secondary | ICD-10-CM | POA: Insufficient documentation

## 2019-08-27 DIAGNOSIS — I70234 Atherosclerosis of native arteries of right leg with ulceration of heel and midfoot: Secondary | ICD-10-CM | POA: Insufficient documentation

## 2019-08-27 DIAGNOSIS — L97419 Non-pressure chronic ulcer of right heel and midfoot with unspecified severity: Secondary | ICD-10-CM | POA: Insufficient documentation

## 2019-08-27 DIAGNOSIS — I509 Heart failure, unspecified: Secondary | ICD-10-CM | POA: Insufficient documentation

## 2019-08-27 DIAGNOSIS — I4891 Unspecified atrial fibrillation: Secondary | ICD-10-CM | POA: Insufficient documentation

## 2019-08-27 DIAGNOSIS — Z7902 Long term (current) use of antithrombotics/antiplatelets: Secondary | ICD-10-CM | POA: Insufficient documentation

## 2019-08-27 DIAGNOSIS — J449 Chronic obstructive pulmonary disease, unspecified: Secondary | ICD-10-CM | POA: Insufficient documentation

## 2019-08-27 DIAGNOSIS — I110 Hypertensive heart disease with heart failure: Secondary | ICD-10-CM | POA: Insufficient documentation

## 2019-08-27 DIAGNOSIS — E1142 Type 2 diabetes mellitus with diabetic polyneuropathy: Secondary | ICD-10-CM | POA: Diagnosis not present

## 2019-08-27 DIAGNOSIS — Z950 Presence of cardiac pacemaker: Secondary | ICD-10-CM | POA: Insufficient documentation

## 2019-08-27 DIAGNOSIS — Z833 Family history of diabetes mellitus: Secondary | ICD-10-CM | POA: Insufficient documentation

## 2019-08-27 DIAGNOSIS — I252 Old myocardial infarction: Secondary | ICD-10-CM | POA: Insufficient documentation

## 2019-08-27 DIAGNOSIS — Z87891 Personal history of nicotine dependence: Secondary | ICD-10-CM | POA: Insufficient documentation

## 2019-08-27 DIAGNOSIS — Z882 Allergy status to sulfonamides status: Secondary | ICD-10-CM | POA: Diagnosis not present

## 2019-08-27 DIAGNOSIS — Z79890 Hormone replacement therapy (postmenopausal): Secondary | ICD-10-CM | POA: Insufficient documentation

## 2019-08-27 DIAGNOSIS — I70212 Atherosclerosis of native arteries of extremities with intermittent claudication, left leg: Secondary | ICD-10-CM | POA: Insufficient documentation

## 2019-08-27 DIAGNOSIS — E11621 Type 2 diabetes mellitus with foot ulcer: Secondary | ICD-10-CM | POA: Insufficient documentation

## 2019-08-27 DIAGNOSIS — E039 Hypothyroidism, unspecified: Secondary | ICD-10-CM | POA: Diagnosis not present

## 2019-08-27 DIAGNOSIS — Z888 Allergy status to other drugs, medicaments and biological substances status: Secondary | ICD-10-CM | POA: Insufficient documentation

## 2019-08-27 DIAGNOSIS — K7460 Unspecified cirrhosis of liver: Secondary | ICD-10-CM | POA: Insufficient documentation

## 2019-08-27 DIAGNOSIS — Z8249 Family history of ischemic heart disease and other diseases of the circulatory system: Secondary | ICD-10-CM | POA: Insufficient documentation

## 2019-08-27 DIAGNOSIS — Z89421 Acquired absence of other right toe(s): Secondary | ICD-10-CM | POA: Diagnosis not present

## 2019-08-27 DIAGNOSIS — Z794 Long term (current) use of insulin: Secondary | ICD-10-CM | POA: Insufficient documentation

## 2019-08-27 DIAGNOSIS — M1990 Unspecified osteoarthritis, unspecified site: Secondary | ICD-10-CM | POA: Diagnosis not present

## 2019-08-27 HISTORY — PX: PERIPHERAL VASCULAR BALLOON ANGIOPLASTY: CATH118281

## 2019-08-27 HISTORY — PX: ABDOMINAL AORTOGRAM W/LOWER EXTREMITY: CATH118223

## 2019-08-27 LAB — POCT I-STAT, CHEM 8
BUN: 36 mg/dL — ABNORMAL HIGH (ref 8–23)
Calcium, Ion: 1.19 mmol/L (ref 1.15–1.40)
Chloride: 95 mmol/L — ABNORMAL LOW (ref 98–111)
Creatinine, Ser: 1.2 mg/dL — ABNORMAL HIGH (ref 0.44–1.00)
Glucose, Bld: 201 mg/dL — ABNORMAL HIGH (ref 70–99)
HCT: 41 % (ref 36.0–46.0)
Hemoglobin: 13.9 g/dL (ref 12.0–15.0)
Potassium: 3.9 mmol/L (ref 3.5–5.1)
Sodium: 134 mmol/L — ABNORMAL LOW (ref 135–145)
TCO2: 31 mmol/L (ref 22–32)

## 2019-08-27 SURGERY — ABDOMINAL AORTOGRAM W/LOWER EXTREMITY
Anesthesia: LOCAL | Laterality: Left

## 2019-08-27 MED ORDER — HYDROMORPHONE HCL 1 MG/ML IJ SOLN: 0.5000 mg | INTRAMUSCULAR | Status: DC | PRN

## 2019-08-27 MED ORDER — HEPARIN SODIUM (PORCINE) 1000 UNIT/ML IJ SOLN
INTRAMUSCULAR | Status: DC | PRN
  Administered 2019-08-27: 9000 [IU] via INTRAVENOUS

## 2019-08-27 MED ORDER — HYDRALAZINE HCL 20 MG/ML IJ SOLN: 5.0000 mg | INTRAMUSCULAR | Status: DC | PRN

## 2019-08-27 MED ORDER — FENTANYL CITRATE (PF) 100 MCG/2ML IJ SOLN
INTRAMUSCULAR | Status: DC | PRN
  Administered 2019-08-27: 50 ug via INTRAVENOUS
  Administered 2019-08-27: 25 ug via INTRAVENOUS

## 2019-08-27 MED ORDER — SODIUM CHLORIDE 0.9 % IV SOLN: INTRAVENOUS | Status: DC

## 2019-08-27 MED ORDER — SODIUM CHLORIDE 0.9 % IV SOLN
250.0000 mL | INTRAVENOUS | Status: DC | PRN
Start: 2019-08-27 — End: 2019-08-27

## 2019-08-27 MED ORDER — ACETAMINOPHEN 325 MG PO TABS: 650.0000 mg | ORAL_TABLET | ORAL | Status: DC | PRN

## 2019-08-27 MED ORDER — LIDOCAINE HCL (PF) 1 % IJ SOLN
INTRAMUSCULAR | Status: AC
  Filled 2019-08-27: qty 30

## 2019-08-27 MED ORDER — SODIUM CHLORIDE 0.9 % IV SOLN
INTRAVENOUS | Status: DC
Start: 2019-08-27 — End: 2019-08-27

## 2019-08-27 MED ORDER — HEPARIN (PORCINE) IN NACL 1000-0.9 UT/500ML-% IV SOLN
INTRAVENOUS | Status: AC
  Filled 2019-08-27: qty 1000

## 2019-08-27 MED ORDER — SODIUM CHLORIDE 0.9% FLUSH: 3.0000 mL | Freq: Two times a day (BID) | INTRAVENOUS | Status: DC

## 2019-08-27 MED ORDER — FENTANYL CITRATE (PF) 100 MCG/2ML IJ SOLN
INTRAMUSCULAR | Status: AC
  Filled 2019-08-27: qty 2

## 2019-08-27 MED ORDER — LABETALOL HCL 5 MG/ML IV SOLN
10.0000 mg | INTRAVENOUS | Status: DC | PRN
Start: 2019-08-27 — End: 2019-08-27

## 2019-08-27 MED ORDER — OXYCODONE HCL 5 MG PO TABS
5.0000 mg | ORAL_TABLET | ORAL | Status: DC | PRN
Start: 2019-08-27 — End: 2019-08-27

## 2019-08-27 MED ORDER — CLOPIDOGREL BISULFATE 75 MG PO TABS: 75.0000 mg | ORAL_TABLET | Freq: Every day | ORAL | Status: DC

## 2019-08-27 MED ORDER — MIDAZOLAM HCL 5 MG/5ML IJ SOLN
INTRAMUSCULAR | Status: DC | PRN
  Administered 2019-08-27 (×2): 1 mg via INTRAVENOUS

## 2019-08-27 MED ORDER — HEPARIN SODIUM (PORCINE) 1000 UNIT/ML IJ SOLN
INTRAMUSCULAR | Status: AC
  Filled 2019-08-27: qty 1

## 2019-08-27 MED ORDER — SODIUM CHLORIDE 0.9% FLUSH: 3.0000 mL | INTRAVENOUS | Status: DC | PRN

## 2019-08-27 MED ORDER — IODIXANOL 320 MG/ML IV SOLN
INTRAVENOUS | Status: DC | PRN
  Administered 2019-08-27: 140 mL via INTRA_ARTERIAL

## 2019-08-27 MED ORDER — LIDOCAINE HCL (PF) 1 % IJ SOLN
INTRAMUSCULAR | Status: DC | PRN
  Administered 2019-08-27: 15 mL via INTRADERMAL

## 2019-08-27 MED ORDER — ONDANSETRON HCL 4 MG/2ML IJ SOLN
4.0000 mg | Freq: Four times a day (QID) | INTRAMUSCULAR | Status: DC | PRN
Start: 2019-08-27 — End: 2019-08-27

## 2019-08-27 MED ORDER — HEPARIN (PORCINE) IN NACL 1000-0.9 UT/500ML-% IV SOLN
INTRAVENOUS | Status: DC | PRN
  Administered 2019-08-27 (×2): 500 mL

## 2019-08-27 MED ORDER — MIDAZOLAM HCL 5 MG/5ML IJ SOLN
INTRAMUSCULAR | Status: AC
  Filled 2019-08-27: qty 5

## 2019-08-27 SURGICAL SUPPLY — 22 items
BALLN STERLING OTW 5X150X150 (BALLOONS) ×2
BALLOON STERLING OTW 5X150X150 (BALLOONS) IMPLANT
CATH AURYON 5FR ATHEREC 1.5 (CATHETERS) ×1 IMPLANT
CATH NAVICROSS ST .035X135CM (MICROCATHETER) ×1 IMPLANT
CATH OMNI FLUSH 5F 65CM (CATHETERS) ×1 IMPLANT
CATH QUICKCROSS .035X135CM (MICROCATHETER) ×1 IMPLANT
CLOSURE MYNX CONTROL 6F/7F (Vascular Products) ×1 IMPLANT
DCB RANGER 5.0X150 150 (BALLOONS) IMPLANT
DEVICE TORQUE .025-.038 (MISCELLANEOUS) ×1 IMPLANT
GLIDEWIRE ADV .035X260CM (WIRE) ×1 IMPLANT
KIT ENCORE 26 ADVANTAGE (KITS) ×1 IMPLANT
KIT MICROPUNCTURE NIT STIFF (SHEATH) ×1 IMPLANT
KIT PV (KITS) ×2 IMPLANT
RANGER DCB 5.0X150 150 (BALLOONS) ×2
SHEATH PINNACLE 5F 10CM (SHEATH) ×1 IMPLANT
SHEATH PINNACLE 6F 10CM (SHEATH) ×1 IMPLANT
SHEATH PINNACLE ST 6F 45CM (SHEATH) ×1 IMPLANT
SYR MEDRAD MARK 7 150ML (SYRINGE) ×2 IMPLANT
TRANSDUCER W/STOPCOCK (MISCELLANEOUS) ×2 IMPLANT
TRAY PV CATH (CUSTOM PROCEDURE TRAY) ×2 IMPLANT
WIRE SPARTACORE .014X300CM (WIRE) ×1 IMPLANT
WIRE STARTER BENTSON 035X150 (WIRE) ×1 IMPLANT

## 2019-08-27 NOTE — Discharge Instructions (Signed)
° °  [facility] [place] ° °Discharge Instructions ° °Lower Extremity Angiogram; Angioplasty/Stenting ° °Please refer to the following instructions for your post-procedure care. Your surgeon or physician assistant will discuss any changes with you. ° °Activity ° °Avoid lifting more than 8 pounds (1 gallons of milk) for 72 hours (3 days) after your procedure. You may walk as much as you can tolerate. It's OK to drive after 72 hours. ° °Bathing/Showering ° °You may shower the day after your procedure. If you have a bandage, you may remove it at 24- 48 hours. Clean your incision site with mild soap and water. Pat the area dry with a clean towel. ° °Diet ° °Resume your pre-procedure diet. There are no special food restrictions following this procedure. All patients with peripheral vascular disease should follow a low fat/low cholesterol diet. In order to heal from your surgery, it is CRITICAL to get adequate nutrition. Your body requires vitamins, minerals, and protein. Vegetables are the best source of vitamins and minerals. Vegetables also provide the perfect balance of protein. Processed food has little nutritional value, so try to avoid this. ° °Medications ° °Resume taking all of your medications unless your doctor tells you not to. If your incision is causing pain, you may take over-the-counter pain relievers such as acetaminophen (Tylenol) ° °Follow Up ° °Follow up will be arranged at the time of your procedure. You may have an office visit scheduled or may be scheduled for surgery. Ask your surgeon if you have any questions. ° °Please call us immediately for any of the following conditions: °•Severe or worsening pain your legs or feet at rest or with walking. °•Increased pain, redness, drainage at your groin puncture site. °•Fever of 101 degrees or higher. °•If you have any mild or slow bleeding from your puncture site: lie down, apply firm constant pressure over the area with a piece of  gauze or a clean wash cloth for 30 minutes- no peeking!, call 911 right away if you are still bleeding after 30 minutes, or if the bleeding is heavy and unmanageable. ° °Reduce your risk factors of vascular disease: ° °Stop smoking. If you would like help call [facility] at 1-800-QUIT-NOW (1-800-784-8669) or [place] at 336-586-4000. °Manage your cholesterol °Maintain a desired weight °Control your diabetes °Keep your blood pressure down ° °If you have any questions, please call the office at [phone] ° °

## 2019-08-27 NOTE — Op Note (Signed)
Patient name: Alexis [name] MRN: [id number] DOB: 11/01/1950 Sex: female  08/27/2019 Pre-operative Diagnosis: pad, left lower extremity claudication Post-operative diagnosis:  Same Surgeon:  Eda Paschal. Donzetta Matters, MD Procedure Performed: 1.  Ultrasound-guided cannulation right common femoral artery 2.  Aortogram with bilateral lower extremity runoff 3.  Laser arthrectomy with 1.5 [place] left popliteal artery 4.  Drug-coated balloon angioplasty left popliteal artery with 5 x 150 mm Ranger 5.  Mynx device closure right common femoral artery 6.  Moderate sedation with fentanyl and Versed for 73 minutes   Indications: 69 year old female previously critical right lower extremity ischemia with a wound that is now healing after laser arthrectomy and stenting of the right SFA popliteal.  She is now negated for angiography of the left lower extremity for short distance claudication.  Findings: Aortoiliac segments are free of flow-limiting stenosis.  Right side there appears to be residual 80% stenosis in two areas in the popliteal and distal SFA she does appear to have runoff via the peroneal is her dominant runoff as well as the posterior tibial on the right.  Anterior tibial gives out after several centimeters.  On the left side she has high-grade stenosis of the distal left SFA popliteal as well as the popliteal behind the knee.  This measures approximately 90% of the distal SFA and is occluded behind the knee.  After laser atherectomy drug-coated balloon angioplasty there is no residual stenosis there is one small area of dissection that is nonflow limiting.  Runoff is via the anterior tibial and peroneal arteries.  We will follow the patient up in a couple weeks to check her wound healing on her right lower extremity she may need repeat revascularization right lower extremity.   Procedure:  The patient was identified in the holding area and taken to room 2.  The patient was then placed supine on the  table and prepped and draped in the usual sterile fashion.  A time out was called.  Ultrasound was used to evaluate the right common femoral artery.  This was noted to be patent.  The areas anesthetized 1% lidocaine cannulated with direct ultrasound visualization with micropuncture needle followed wire sheath.  And images saved the permanent record.  Patient was also administered fentanyl and Versed for a total of 73 minutes and her vital signs were monitored throughout the case.  We placed a Bentson wire followed by five [place] sheath Omni catheter to L1 aortogram was performed followed by bilateral extremity runoff.  We crossed the bifurcation with Omni catheter Glidewire advantage.  We placed a long six French sheath into the left SFA patient was fully heparinized.  We were able to cross the lesion with a Glidewire quick cross catheter and Ferd Hibbs cross catheter would not cross we are able to get an 014 wire across.  We then performed laser arthrectomy of the entire popliteal artery from above the knee to just behind and below the knee.  We then performed a 5 mm balloon angioplasty this demonstrated much improved flow.  We performed drug-coated balloon angioplasty with 5 mm Ranger at nominal pressure for 3 minutes.  Completion demonstrated no residual stenosis made once more of dissection.  She had two-vessel runoff via the anterior tibial and peroneal arteries.  We elected to exchange for a short six [place] sheath.  We performed retrograde angiography.  We deployed a minx device.  She tolerated procedure without any complication.  Contrast: 140 cc    [name] C. Donzetta Matters, MD [facility] Office: [phone] Pager: [phone]

## 2019-08-27 NOTE — H&P (Signed)
   History and Physical Update  The patient was interviewed and re-examined.  The patient's previous History and Physical has been reviewed and is unchanged recent office visit.  Plan for left lower extremity angiography with possible invention today.  Miroslav Gin C. Donzetta Matters, MD [facility] [place] Office: [phone] Pager: [phone]   08/27/2019, 10:33 AM

## 2019-08-28 ENCOUNTER — Encounter (HOSPITAL_COMMUNITY): Payer: Self-pay | Admitting: Vascular Surgery

## 2019-08-28 ENCOUNTER — Other Ambulatory Visit: Payer: Self-pay | Admitting: Medical

## 2019-08-28 ENCOUNTER — Telehealth: Payer: Self-pay

## 2019-08-28 NOTE — Telephone Encounter (Signed)
Triage call received from pt reporting having severe pain and inability to sleep after procedure on yesterday. Attempted to reach pt. Left message to return call.

## 2019-08-28 NOTE — Telephone Encounter (Signed)
Requesting:oxycodone Contract:08/07/19 UDS:07/27/19 Last Visit:08/15/19 Next Visit:n/a  Last Refill:08/03/19  Please Advise

## 2019-08-29 ENCOUNTER — Telehealth: Payer: Self-pay | Admitting: Medical

## 2019-08-29 ENCOUNTER — Telehealth: Payer: Self-pay

## 2019-08-29 ENCOUNTER — Encounter: Payer: Self-pay | Admitting: Medical

## 2019-08-29 MED ORDER — OXYCODONE HCL 5 MG PO TABS: ORAL_TABLET | ORAL | 0 refills | Status: DC

## 2019-08-29 NOTE — Telephone Encounter (Signed)
I sent you note discussing decision to put on oxycodone.  She is requesting  refill little early. She has pain following various procedures. She has severe vascular disease/procedures. Has avoid amputations. Recent procedure with post op pain.   Will ask Jarrett Soho to reach out to patient ad see if she is out completely as by chart review she will be out around July 3.  Sending for your review.

## 2019-08-29 NOTE — Telephone Encounter (Signed)
Spoke with pt regarding pain in left groin. Reports she's been having discomfort since during the procedure and request pain medication. She has taken oxycodone prescribed for another medical condition and reports effective pain relief. Pt denies any bruising, swelling, warmth to touch, drainage, chills or fever. Advised pt noticed a new Rx was sent from PCP to pharmacy. Pt voiced understanding. Advised pt can alternate using cold/warm compress to groin for pain relief and to call office if for any adverse symptoms previously mentioned. Pt voiced understanding.

## 2019-08-29 NOTE — Telephone Encounter (Signed)
Spoke with patient and she stated she is not due until the 4th and has enough pills to last her until Saturday(7/3).

## 2019-08-29 NOTE — Telephone Encounter (Signed)
MyChart message received from pt reporting pain post procedure and requesting pain medication. Attempted to reach pt, no answer. Left message on machine to return call.   Spoke with Tiffany at [facility], PA-C's office regarding pts request for pain medication. Voiced understanding.

## 2019-08-29 NOTE — Telephone Encounter (Signed)
Caller: [facility]  Call Back # [phone]  Per Kia, patient had a procedure at their office. Patient called their office today requesting pain medication. Per Kia, patient is currently on pain medication (oxycodone) prescribed by her PCP. (Edaward Saguier). Kia, would like [facility] to know that patient is calling their office requesting pain medication.

## 2019-08-29 NOTE — Telephone Encounter (Signed)
Rx sent to pharmacy. Alexis [name] did talk with pt. She come due over weekend. I sent in rx.

## 2019-08-30 ENCOUNTER — Ambulatory Visit: Payer: HMO | Admitting: Orthotics

## 2019-08-30 NOTE — Telephone Encounter (Signed)
noted 

## 2019-08-31 ENCOUNTER — Other Ambulatory Visit: Payer: Self-pay

## 2019-08-31 ENCOUNTER — Ambulatory Visit: Payer: HMO | Admitting: Podiatry

## 2019-08-31 DIAGNOSIS — Z794 Long term (current) use of insulin: Secondary | ICD-10-CM

## 2019-08-31 DIAGNOSIS — I739 Peripheral vascular disease, unspecified: Secondary | ICD-10-CM

## 2019-08-31 DIAGNOSIS — L97509 Non-pressure chronic ulcer of other part of unspecified foot with unspecified severity: Secondary | ICD-10-CM

## 2019-08-31 DIAGNOSIS — L97511 Non-pressure chronic ulcer of other part of right foot limited to breakdown of skin: Secondary | ICD-10-CM

## 2019-08-31 DIAGNOSIS — E11621 Type 2 diabetes mellitus with foot ulcer: Secondary | ICD-10-CM

## 2019-08-31 NOTE — Telephone Encounter (Signed)
This is being addressed by Triage. This patient is on chronic pain medication at this time.

## 2019-09-04 ENCOUNTER — Encounter: Payer: Self-pay | Admitting: Podiatry

## 2019-09-04 NOTE — Progress Notes (Addendum)
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  She states that overall she is doing much better in her big toe.  She denies any swelling.  With some occasional redness the tips of the toes.  Clear drainage intermittently but no warmth.  Denies any pus.  She is been continue with silver alginate dressing daily.   She currently denies any fevers, chills, nausea, vomiting.  No calf pain, chest pain, shortness of breath.   Denies any systemic complaints such as fevers, chills, nausea, vomiting. No acute changes since last appointment, and no other complaints at this time.   Objective: AAO x3, NAD See pictures below.  No rashes noted.  Mild debridement was performed.  Wound present on the medial after the toe.  Reepithelialization of the skin tissue noted.  Appears to be healthier today.  There is no probing, undermining or tunneling.  Mild chronic erythema to the distal aspect of the toes bilaterally.  There is no warmth.  No ascending cellulitis.  No fluctuation crepitation.  There is no malodor.  The wound on the medial heel has healed. 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  -Debrided the eschar to the distal aspect of the hallux and complications of chronic wound today.  We will continue with daily dressing changes as she has been doing.  Continue offloading. -Monitor for any clinical signs or symptoms of infection and directed to call the office immediately should any occur or go to the ER. -Given the patient has a history of ulceration with underlying diabetes with neuropathy I believe patient will benefit from diabetic shoes.  Should be scheduled to see Liliane Channel for diabetic shoes  and insoles.  No follow-ups on file.  Felipa Furnace DPM

## 2019-09-06 ENCOUNTER — Encounter: Payer: Self-pay | Admitting: Medical

## 2019-09-07 ENCOUNTER — Encounter: Payer: Self-pay | Admitting: Family

## 2019-09-07 ENCOUNTER — Ambulatory Visit (INDEPENDENT_AMBULATORY_CARE_PROVIDER_SITE_OTHER): Payer: HMO | Admitting: Family

## 2019-09-07 ENCOUNTER — Other Ambulatory Visit: Payer: Self-pay

## 2019-09-07 ENCOUNTER — Telehealth: Payer: Self-pay | Admitting: Gastroenterology

## 2019-09-07 VITALS — BP 130/78 | HR 72 | Temp 98.0°F | Resp 16 | Ht 62.0 in | Wt 197.0 lb

## 2019-09-07 DIAGNOSIS — N3000 Acute cystitis without hematuria: Secondary | ICD-10-CM

## 2019-09-07 DIAGNOSIS — N390 Urinary tract infection, site not specified: Secondary | ICD-10-CM | POA: Diagnosis not present

## 2019-09-07 DIAGNOSIS — B9620 Unspecified Escherichia coli [E. coli] as the cause of diseases classified elsewhere: Secondary | ICD-10-CM | POA: Diagnosis not present

## 2019-09-07 DIAGNOSIS — I509 Heart failure, unspecified: Secondary | ICD-10-CM | POA: Diagnosis not present

## 2019-09-07 DIAGNOSIS — G609 Hereditary and idiopathic neuropathy, unspecified: Secondary | ICD-10-CM | POA: Diagnosis not present

## 2019-09-07 DIAGNOSIS — R350 Frequency of micturition: Secondary | ICD-10-CM

## 2019-09-07 DIAGNOSIS — S31109D Unspecified open wound of abdominal wall, unspecified quadrant without penetration into peritoneal cavity, subsequent encounter: Secondary | ICD-10-CM | POA: Diagnosis not present

## 2019-09-07 DIAGNOSIS — I110 Hypertensive heart disease with heart failure: Secondary | ICD-10-CM | POA: Diagnosis not present

## 2019-09-07 DIAGNOSIS — L97513 Non-pressure chronic ulcer of other part of right foot with necrosis of muscle: Secondary | ICD-10-CM | POA: Diagnosis not present

## 2019-09-07 DIAGNOSIS — Z794 Long term (current) use of insulin: Secondary | ICD-10-CM | POA: Diagnosis not present

## 2019-09-07 DIAGNOSIS — I4891 Unspecified atrial fibrillation: Secondary | ICD-10-CM | POA: Diagnosis not present

## 2019-09-07 DIAGNOSIS — L97523 Non-pressure chronic ulcer of other part of left foot with necrosis of muscle: Secondary | ICD-10-CM | POA: Diagnosis not present

## 2019-09-07 DIAGNOSIS — E11621 Type 2 diabetes mellitus with foot ulcer: Secondary | ICD-10-CM | POA: Diagnosis not present

## 2019-09-07 LAB — POC URINALSYSI DIPSTICK (AUTOMATED)
Bilirubin, UA: NEGATIVE
Glucose, UA: NEGATIVE
Ketones, UA: NEGATIVE
Nitrite, UA: POSITIVE
Protein, UA: POSITIVE — AB
Spec Grav, UA: 1.02 (ref 1.010–1.025)
Urobilinogen, UA: 1 E.U./dL
pH, UA: 6 (ref 5.0–8.0)

## 2019-09-07 MED ORDER — AMOXICILLIN-POT CLAVULANATE 875-125 MG PO TABS
1.0000 | ORAL_TABLET | Freq: Two times a day (BID) | ORAL | 0 refills | Status: DC
Start: 2019-09-07 — End: 2019-10-05

## 2019-09-07 NOTE — Progress Notes (Signed)
Subjective:    Patient ID: Alexis [name], female    DOB: 1950-12-16, 69 y.o.   MRN: [id number]  HPI  Patient is a 69 yr old female who presents today with chief complaint of dysuria. She was recently treated for an E. Coli UTI with keflex.  Last dose of abx was 2 days ago. Then developed   Reports that she had a "diarrhea blow out" last week x 1.    She took her last dose of antibiotics 2 days ago.  Reports + dysuria.  Took AZO this AM.  She denies fever but has had chills.    6/20 keflex She sees Dr. Lyndel Safe for her hx of cirrhosis.  Review of Systems See HPI  Past Medical History:  Diagnosis Date  . Allergy   . Anxiety   . Arthritis   . Asthma   . Atrial fibrillation ([place])   . CHF (congestive heart failure) ([place])   . Cirrhosis ([place])   . COPD (chronic obstructive pulmonary disease) ([place])   . Depression 05/26/2019  . Diabetes mellitus without complication ([place])   . Hyperlipidemia   . Hypertension   . Macrocytic anemia 09/04/2018  . Myocardial infarction ([place])    several  . Pacemaker    CRT-P with RV lead and His Bundle lead ( high threshold)   . Peripheral neuropathy   . Pneumonia   . PONV (postoperative nausea and vomiting)    unsure of exactly what happened at [facility] in 2010 (knee surgery) that led to crital care  . Thyroid disease      Social History   Socioeconomic History  . Marital status: Married    Spouse name: Juanda Crumble  . Number of children: 1  . Years of education: Not on file  . Highest education level: Not on file  Occupational History  . 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 Smoking status: Former Smoker    Types: Cigarettes    Quit date: 03/01/1998    Years since quitting: 21.5  . Smokeless tobacco: Never Used  Vaping Use  . Vaping Use: Never used  Substance and Sexual Activity  . Alcohol use: No    Alcohol/week: 0.0 standard drinks    Comment: Previous hx: recovering alcoholic.   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  . Drug use: Not Currently    Types: Marijuana    Comment: remote use  . Sexual activity: Never  Other Topics Concern  . 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 Difficulty of Paying Living Expenses: Patient refused  Food Insecurity: No Food Insecurity  . Worried About Charity fundraiser in the Last Year: Never true  . Ran Out of Food in the Last Year: Never true  Transportation Needs: No Transportation Needs  . Lack of Transportation (Medical): No  . Lack of Transportation (Non-Medical): No  Physical Activity: Unknown  . Days of Exercise per Week: 0 days  . Minutes of Exercise per Session: Not on file  Stress: No Stress Concern Present  . Feeling of Stress : Only a little  Social Connections: Unknown  .  Frequency of Communication with Friends and Family: More than three times a week  . Frequency of Social Gatherings with Friends and Family: Not on file  . Attends Religious Services: Not on file  . Active Member of Clubs or Organizations: Not on file  . Attends Archivist Meetings: Not on file  . Marital Status: Married  Human resources officer Violence:   . Fear of Current or Ex-Partner:   . Emotionally Abused:   Marland Kitchen Physically Abused:   . Sexually Abused:     Past Surgical History:  Procedure Laterality Date  . ABDOMINAL AORTOGRAM W/LOWER EXTREMITY N/A 04/29/2016   Procedure: Abdominal Aortogram w/Lower Extremity;  Surgeon: Waynetta Sandy, MD;  Location: [facility];  Service: Cardiovascular;  Laterality: N/A;  . ABDOMINAL AORTOGRAM W/LOWER EXTREMITY N/A 05/06/2017   Procedure: ABDOMINAL AORTOGRAM W/LOWER EXTREMITY;  Surgeon: Angelia Mould, MD;  Location: [facility];  Service: Cardiovascular;  Laterality: N/A;  bilateral  . ABDOMINAL AORTOGRAM W/LOWER EXTREMITY Bilateral 05/04/2019   Procedure: ABDOMINAL AORTOGRAM W/LOWER EXTREMITY;  Surgeon: Angelia Mould, MD;  Location: [facility];  Service: Cardiovascular;  Laterality: Bilateral;  . ABDOMINAL AORTOGRAM W/LOWER EXTREMITY Left 08/27/2019   Procedure: ABDOMINAL AORTOGRAM W/LOWER EXTREMITY;  Surgeon: Waynetta Sandy, MD;  Location: [place];  Service: Cardiovascular;  Laterality: Left;  . ABDOMINAL HYSTERECTOMY    . AMPUTATION Right 07/30/2016   Procedure: AMPUTATION RIGHT SECOND TOE;  Surgeon: Angelia Mould, MD;  Location: [facility];  Service: Vascular;  Laterality: Right;  . CARDIAC CATHETERIZATION     2005 at [place] N/A 12/25/2018   Procedure: COLONOSCOPY WITH PROPOFOL;  Surgeon: Jackquline Denmark, MD;  Location: [facility];  Service: Endoscopy;  Laterality: N/A;  . ESOPHAGEAL BANDING  12/25/2018   Procedure: ESOPHAGEAL BANDING;  Surgeon: Jackquline Denmark, MD;  Location: [facility];  Service: Endoscopy;;  . ESOPHAGEAL BANDING  05/01/2019   Procedure: ESOPHAGEAL BANDING;  Surgeon: Jackquline Denmark, MD;  Location: [facility];  Service: Endoscopy;;  . ESOPHAGOGASTRODUODENOSCOPY (EGD) WITH PROPOFOL N/A 12/25/2018   Procedure: ESOPHAGOGASTRODUODENOSCOPY (EGD) WITH PROPOFOL;  Surgeon: Jackquline Denmark, MD;  Location: [facility];  Service: Endoscopy;  Laterality: N/A;  . ESOPHAGOGASTRODUODENOSCOPY (EGD) WITH PROPOFOL N/A 05/01/2019   Procedure: ESOPHAGOGASTRODUODENOSCOPY (EGD) WITH PROPOFOL;  Surgeon: Jackquline Denmark, MD;  Location: [facility];  Service: Endoscopy;  Laterality: N/A;  . LOWER EXTREMITY ANGIOGRAPHY Right 05/14/2019   Procedure: LOWER EXTREMITY ANGIOGRAPHY;  Surgeon: Waynetta Sandy, MD;  Location: [facility];  Service: Cardiovascular;  Laterality: Right;  .  PERIPHERAL VASCULAR ATHERECTOMY Right 04/29/2016   Procedure: Peripheral Vascular Atherectomy-Right Popliteal;  Surgeon: Waynetta Sandy, MD;  Location: [facility];  Service: Cardiovascular;  Laterality: Right;  . PERIPHERAL VASCULAR ATHERECTOMY Right 05/14/2019   Procedure: PERIPHERAL VASCULAR ATHERECTOMY;  Surgeon: Waynetta Sandy, MD;  Location: [facility];  Service: Cardiovascular;  Laterality: Right;  right SFA/pop  . PERIPHERAL VASCULAR BALLOON ANGIOPLASTY Left 08/27/2019   Procedure: PERIPHERAL VASCULAR BALLOON ANGIOPLASTY;  Surgeon: Waynetta Sandy, MD;  Location: [facility];  Service: Cardiovascular;  Laterality: Left;  popliteal  . PERIPHERAL VASCULAR INTERVENTION Right 04/29/2016   Procedure: Peripheral Vascular Intervention-Right Popliteal;  Surgeon: Waynetta Sandy, MD;  Location: [facility];  Service: Cardiovascular;  Laterality: Right;  POPLITEAL PTA  . PERIPHERAL VASCULAR INTERVENTION Right 05/14/2019   Procedure: PERIPHERAL VASCULAR INTERVENTION;  Surgeon: Waynetta Sandy,  MD;  Location: [facility];  Service: Cardiovascular;  Laterality: Right;  popliteal stent  . RADIOFREQUENCY ABLATION    . TOTAL KNEE ARTHROPLASTY    . TUBAL LIGATION      Family History  Problem Relation Age of Onset  . Diabetes Mother   . Hyperlipidemia Mother   . Diabetes Father   . Hyperlipidemia Father   . Heart disease Father        before age 69  . Heart attack Father   . Diabetes Brother   . Hyperlipidemia Brother   . Heart attack Brother     Allergies  Allergen Reactions  . Indomethacin Other (See Comments)    Renal Insufficiency  . Pregabalin Other (See Comments)    DIZZINESS   . Sulfa Antibiotics Hives  . Morphine And Related Nausea And Vomiting    Current Outpatient Medications on File Prior to Visit  Medication Sig Dispense Refill  . albuterol (VENTOLIN HFA) 108 (90 Base) MCG/ACT inhaler Inhale 2  puffs into the lungs every 6 (six) hours as needed for wheezing or shortness of breath.    . allopurinol (ZYLOPRIM) 100 MG tablet TAKE ONE TABLET BY MOUTH TWICE DAILY (Patient taking differently: Take 100 mg by mouth in the morning and at bedtime. ) 60 tablet 3  . AMBULATORY NON FORMULARY MEDICATION Motorized scooter.  Diagnosis: Osteoarthritis M19.90 1 each 0  . atorvastatin (LIPITOR) 10 MG tablet Take 1 tablet (10 mg total) by mouth daily. 30 tablet 11  . cephALEXin (KEFLEX) 500 MG capsule Take 1 capsule (500 mg total) by mouth 2 (two) times daily. 20 capsule 0  . clopidogrel (PLAVIX) 75 MG tablet Take 1 tablet (75 mg total) by mouth daily. 30 tablet 11  . Continuous Blood Gluc Receiver (FREESTYLE LIBRE 14 DAY READER) DEVI Use as directed to monitor BG.  Dx E11.65    . Continuous Blood Gluc Sensor (FREESTYLE LIBRE 14 DAY SENSOR) MISC INJECT INTO THE SKIN EVERY 14 DAYS    . fluticasone (FLONASE) 50 MCG/ACT nasal spray Place 2 sprays into both nostrils at bedtime. (Patient taking differently: Place 2 sprays into both nostrils in the morning and at bedtime. ) 16 g 3  . furosemide (LASIX) 20 MG tablet TAKE 2 TABLETS BY MOUTH EVERY MORNING AND 1 TABLET AT NOON (Patient taking differently: Take 20-40 mg by mouth See admin instructions. ) 270 tablet 1  . Insulin Aspart FlexPen 100 UNIT/ML SOPN Inject 0-10 Units into the skin daily as needed (If glucose if over 200). Sliding scale    . insulin degludec (TRESIBA) 100 UNIT/ML FlexTouch Pen Inject 14 Units into the skin daily.     . Insulin Pen Needle (PEN NEEDLES 31GX5/16") 31G X 8 MM MISC Use as needed 4 times/day.  Dx E11.65    . levocetirizine (XYZAL) 5 MG tablet TAKE 1 TABLET BY MOUTH EVERY EVENING (Patient taking differently: Take 5 mg by mouth daily. ) 30 tablet 2  . levothyroxine (SYNTHROID) 150 MCG tablet Take 150 mcg by mouth daily before breakfast.     . losartan (COZAAR) 25 MG tablet TAKE ONE (1) TABLET BY MOUTH EVERY DAY (Patient taking  differently: Take 25 mg by mouth daily. ) 90 tablet 1  . Melatonin 10 MG TABS Take 20 mg by mouth at bedtime.    . metoprolol succinate (TOPROL-XL) 25 MG 24 hr tablet TAKE ONE (1) TABLET BY MOUTH EVERY DAY (Patient taking differently: Take 25 mg by mouth daily. ) 90 tablet 1  .  ondansetron (ZOFRAN-ODT) 4 MG disintegrating tablet Take 4 mg by mouth every 8 (eight) hours as needed for nausea or vomiting.     Glory Rosebush VERIO test strip 3 (three) times daily.    Marland Kitchen oxyCODONE (OXY IR/ROXICODONE) 5 MG immediate release tablet 1 tab po daily as needed for severe pain 30 tablet 0  . Prenatal Vit-Fe Fumarate-FA (MULTIVITAMIN-PRENATAL) 27-0.8 MG TABS tablet Take 1 tablet by mouth daily.     . Probiotic CAPS Take 2 capsules by mouth daily. 50 billion (gummy)    . RABEprazole (ACIPHEX) 20 MG tablet Take 1 tablet (20 mg total) by mouth daily. 30 tablet 1  . sertraline (ZOLOFT) 50 MG tablet Take 1 tablet (50 mg total) by mouth daily. 30 tablet 3  . spironolactone (ALDACTONE) 50 MG tablet Take 1 tablet (50 mg total) by mouth daily. 30 tablet 11   No current facility-administered medications on file prior to visit.    BP 130/78 (BP Location: Left Arm, Patient Position: Sitting, Cuff Size: Small)   Pulse 72   Temp 98 F (36.7 C) (Oral)   Resp 16   Ht 5' 2"  (1.575 m)   Wt 197 lb (89.4 kg)   SpO2 100%   BMI 36.03 kg/m       Objective:   Physical Exam Constitutional:      Appearance: She is well-developed.  Neck:     Thyroid: No thyromegaly.  Cardiovascular:     Rate and Rhythm: Normal rate and regular rhythm.     Heart sounds: Normal heart sounds. No murmur heard.   Pulmonary:     Effort: Pulmonary effort is normal. No respiratory distress.     Breath sounds: Normal breath sounds. No wheezing.  Abdominal:     Tenderness: There is no right CVA tenderness or left CVA tenderness.  Musculoskeletal:     Cervical back: Neck supple.  Skin:    General: Skin is warm and dry.  Neurological:      Mental Status: She is alert and oriented to person, place, and time.  Psychiatric:        Behavior: Behavior normal.        Thought Content: Thought content normal.        Judgment: Judgment normal.           Assessment & Plan:  UTI- recurrent. UA + large leuks- will send for culture. Rx with Augmentin. She is advised as follows:  Please start augmentin twice daily for 5 days for your urinary tract infection. Please call if you develop fever, or if symptoms worsen or if not improved in 2-3 days.

## 2019-09-07 NOTE — Patient Instructions (Signed)
Please start augmentin twice daily for 5 days for your urinary tract infection. Please call if you develop fever, or if symptoms worsen or if not improved in 2-3 days.

## 2019-09-07 NOTE — Telephone Encounter (Signed)
Spoke to patient to let her know that her weight since 04/12/19 has been fluctuating up and down within 10 pounds.She was seen today by her PCP for  Acute cystitis without hematuria and started on Augmentin twice daily for 5 days. She continues to take Lasix 20 mg along with Aldactone 50 mg daily. Patient is concerned that all the fluid seems to be in her abdomen causing a feeling pf tightness and SOB. Patient would like to know if she should be taking her fluid pills differently.

## 2019-09-09 LAB — URINE CULTURE
MICRO NUMBER:: 10686109
SPECIMEN QUALITY:: ADEQUATE

## 2019-09-10 NOTE — Telephone Encounter (Signed)
From the notes it seems that she is on a higher dose of Lasix She should take Lasix 40 mg p.o. once a day/increase spironolactone 100 mg p.o. once a day Check BMP in 1 week She should have had an ultrasound-that would also tell us how much ascites she has.  I see it has been ordered but not done  Claiborne Billings, also make sure that she had labs done as ordered at the last office visit.  Otherwise we should get those done   RG

## 2019-09-11 ENCOUNTER — Telehealth: Payer: Self-pay | Admitting: Family

## 2019-09-11 ENCOUNTER — Other Ambulatory Visit: Payer: Self-pay | Admitting: Gastroenterology

## 2019-09-11 DIAGNOSIS — K7460 Unspecified cirrhosis of liver: Secondary | ICD-10-CM

## 2019-09-11 MED ORDER — SPIRONOLACTONE 100 MG PO TABS
100.0000 mg | ORAL_TABLET | Freq: Every day | ORAL | 6 refills | Status: DC
Start: 2019-09-11 — End: 2020-01-14

## 2019-09-11 NOTE — Telephone Encounter (Signed)
Left message for pt to return my call.

## 2019-09-11 NOTE — Telephone Encounter (Signed)
Spoke to patient to inform her of Dr Steve Rattler recommendations. [place] has been scheduled for next week. She will have labs done that day as well. A new prescription has been called in for aldactone 171m daily and she will take Lasix 40 mg daily. She will have additional labs drawn that were ordered in May next week. All questions answered. Patient voiced understanding.

## 2019-09-11 NOTE — Telephone Encounter (Signed)
Urine culture does show UTI.  How is she feeling now on the antibiotics?

## 2019-09-12 ENCOUNTER — Encounter: Payer: Self-pay | Admitting: Medical

## 2019-09-12 DIAGNOSIS — R21 Rash and other nonspecific skin eruption: Secondary | ICD-10-CM | POA: Diagnosis not present

## 2019-09-13 NOTE — Telephone Encounter (Signed)
Patient advised of results and she reports she is feeling better

## 2019-09-14 ENCOUNTER — Other Ambulatory Visit: Payer: Self-pay

## 2019-09-14 ENCOUNTER — Ambulatory Visit: Payer: HMO | Admitting: Orthotics

## 2019-09-17 ENCOUNTER — Telehealth: Payer: Self-pay

## 2019-09-17 NOTE — Telephone Encounter (Signed)
Vitals stable, Wt 190, History of UTI and has lower  L back pain, started this morning around 3 am.  Discussed using ice pack to alleviate the pain.  Pt is having pain when breathing from the back pain.  Pt reported she has not slept incorrectly to cause the back pain, per [place] with [facility].  [facility] CB [phone]

## 2019-09-18 ENCOUNTER — Ambulatory Visit (HOSPITAL_BASED_OUTPATIENT_CLINIC_OR_DEPARTMENT_OTHER)
Admission: RE | Admit: 2019-09-18 | Discharge: 2019-09-18 | Disposition: A | Discharge status: Home / Self Care | Payer: HMO | Source: Ambulatory Visit | Attending: Gastroenterology | Admitting: Gastroenterology

## 2019-09-18 ENCOUNTER — Ambulatory Visit (INDEPENDENT_AMBULATORY_CARE_PROVIDER_SITE_OTHER): Payer: HMO | Admitting: Medical

## 2019-09-18 ENCOUNTER — Ambulatory Visit (HOSPITAL_BASED_OUTPATIENT_CLINIC_OR_DEPARTMENT_OTHER)
Admission: RE | Admit: 2019-09-18 | Discharge: 2019-09-18 | Disposition: A | Discharge status: Home / Self Care | Payer: HMO | Source: Ambulatory Visit | Attending: Medical | Admitting: Medical

## 2019-09-18 ENCOUNTER — Other Ambulatory Visit: Payer: Self-pay | Admitting: Gastroenterology

## 2019-09-18 ENCOUNTER — Other Ambulatory Visit: Payer: Self-pay

## 2019-09-18 VITALS — BP 130/72 | HR 80 | Resp 20 | Ht 62.0 in | Wt 190.0 lb

## 2019-09-18 DIAGNOSIS — I8500 Esophageal varices without bleeding: Secondary | ICD-10-CM

## 2019-09-18 DIAGNOSIS — M79671 Pain in right foot: Secondary | ICD-10-CM

## 2019-09-18 DIAGNOSIS — M25559 Pain in unspecified hip: Secondary | ICD-10-CM

## 2019-09-18 DIAGNOSIS — K219 Gastro-esophageal reflux disease without esophagitis: Secondary | ICD-10-CM

## 2019-09-18 DIAGNOSIS — Z89421 Acquired absence of other right toe(s): Secondary | ICD-10-CM | POA: Diagnosis not present

## 2019-09-18 DIAGNOSIS — K7460 Unspecified cirrhosis of liver: Secondary | ICD-10-CM | POA: Diagnosis not present

## 2019-09-18 DIAGNOSIS — Z87440 Personal history of urinary (tract) infections: Secondary | ICD-10-CM

## 2019-09-18 DIAGNOSIS — K8020 Calculus of gallbladder without cholecystitis without obstruction: Secondary | ICD-10-CM | POA: Diagnosis not present

## 2019-09-18 DIAGNOSIS — T148XXA Other injury of unspecified body region, initial encounter: Secondary | ICD-10-CM | POA: Diagnosis not present

## 2019-09-18 DIAGNOSIS — M549 Dorsalgia, unspecified: Secondary | ICD-10-CM | POA: Diagnosis not present

## 2019-09-18 DIAGNOSIS — I739 Peripheral vascular disease, unspecified: Secondary | ICD-10-CM

## 2019-09-18 DIAGNOSIS — M1612 Unilateral primary osteoarthritis, left hip: Secondary | ICD-10-CM | POA: Diagnosis not present

## 2019-09-18 DIAGNOSIS — K7689 Other specified diseases of liver: Secondary | ICD-10-CM | POA: Diagnosis not present

## 2019-09-18 LAB — POC URINALSYSI DIPSTICK (AUTOMATED)
Bilirubin, UA: NEGATIVE
Blood, UA: NEGATIVE
Glucose, UA: NEGATIVE
Ketones, UA: NEGATIVE
Leukocytes, UA: NEGATIVE
Nitrite, UA: NEGATIVE
Protein, UA: NEGATIVE
Spec Grav, UA: 1.01 (ref 1.010–1.025)
Urobilinogen, UA: 0.2 E.U./dL
pH, UA: 7 (ref 5.0–8.0)

## 2019-09-18 NOTE — Progress Notes (Signed)
Subjective:    Patient ID: Alexis [name], female    DOB: January 17, 1951, 69 y.o.   MRN: [id number]  HPI  Pt in for follow up.  Pt states 2 weeks ago she saw vascular surgeon for rt lower ext. Pt had history of 2 failed vascular procedures in the past. Vascular MD tried to do procedure but failed per pt. She was in extreme pain during procedure. She describes that she wants to be referred to new vascular MD.    Since procedure she states has some aching to both hips.   Pt has hx of lower ext neuropathy. She is getting EMG end of august.  Pt states also had some left lower cva area pain x 1 day. Worse in am yesterday but now pain is minimal now.   Pt has oxycodone once a day for when changes dressing on lower abdomen wound.      Review of Systems  Constitutional: Negative for chills, fatigue and fever.  Respiratory: Negative for cough, chest tightness, shortness of breath and wheezing.   Cardiovascular: Negative for chest pain and palpitations.  Gastrointestinal: Negative for abdominal pain.  Musculoskeletal:       Hip pain.  Skin: Negative for rash.  Neurological:       Lower ext neuropathy.  Hematological: Negative for adenopathy. Does not bruise/bleed easily.  Psychiatric/Behavioral: Negative for behavioral problems, decreased concentration and dysphoric mood. The patient is not nervous/anxious.     Past Medical History:  Diagnosis Date  . Allergy   . Anxiety   . Arthritis   . Asthma   . Atrial fibrillation ([place])   . CHF (congestive heart failure) ([place])   . Cirrhosis ([place])   . COPD (chronic obstructive pulmonary disease) ([place])   . Depression 05/26/2019  . Diabetes mellitus without complication ([place])   . Hyperlipidemia   . Hypertension   . Macrocytic anemia 09/04/2018  . Myocardial infarction ([place])    several  . Pacemaker    CRT-P with RV lead and His Bundle lead ( high threshold)   . Peripheral neuropathy   . Pneumonia   . PONV (postoperative nausea and  vomiting)    unsure of exactly what happened at [facility] in 2010 (knee surgery) that led to crital care  . Thyroid disease      Social History   Socioeconomic History  . Marital status: Married    Spouse name: Juanda Crumble  . Number of children: 1  . Years of education: Not on file  . Highest education level: Not on file  Occupational History  . 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 Smoking status: Former Smoker    Types: Cigarettes    Quit date: 03/01/1998    Years since quitting: 21.5  . Smokeless tobacco: Never Used  Vaping Use  . Vaping Use: Never used  Substance and Sexual Activity  . 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  . Drug use: Not Currently    Types: Marijuana    Comment: remote use  . Sexual activity: Never  Other Topics Concern  . 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 Difficulty of Paying Living Expenses: Patient refused  Food Insecurity: No Food Insecurity  . Worried About Charity fundraiser in the Last Year: Never true  . Ran Out of Food in the Last Year: Never true  Transportation Needs: No Transportation Needs  . Lack of Transportation (Medical): No  . Lack of Transportation (Non-Medical): No  Physical Activity: Unknown  . Days of Exercise per Week: 0 days  . Minutes of Exercise per Session: Not on file  Stress: No Stress Concern Present  . Feeling of Stress : Only a little  Social Connections: Unknown  . Frequency of Communication with Friends and Family: More than three times a week  . Frequency of Social  Gatherings with Friends and Family: Not on file  . Attends Religious Services: Not on file  . Active Member of Clubs or Organizations: Not on file  . Attends Archivist Meetings: Not on file  . Marital Status: Married  Human resources officer Violence:   . Fear of Current or Ex-Partner:   . Emotionally Abused:   Marland Kitchen Physically Abused:   . Sexually Abused:     Past Surgical History:  Procedure Laterality Date  . ABDOMINAL AORTOGRAM W/LOWER EXTREMITY N/A 04/29/2016   Procedure: Abdominal Aortogram w/Lower Extremity;  Surgeon: Waynetta Sandy, MD;  Location: [place] [facility];  Service: Cardiovascular;  Laterality: N/A;  . ABDOMINAL AORTOGRAM W/LOWER EXTREMITY N/A 05/06/2017   Procedure: ABDOMINAL AORTOGRAM W/LOWER EXTREMITY;  Surgeon: Angelia Mould, MD;  Location: [place] [facility];  Service: Cardiovascular;  Laterality: N/A;  bilateral  . ABDOMINAL AORTOGRAM W/LOWER EXTREMITY Bilateral 05/04/2019   Procedure: ABDOMINAL AORTOGRAM W/LOWER EXTREMITY;  Surgeon: Angelia Mould, MD;  Location: [place] [facility];  Service: Cardiovascular;  Laterality: Bilateral;  . ABDOMINAL AORTOGRAM W/LOWER EXTREMITY Left 08/27/2019   Procedure: ABDOMINAL AORTOGRAM W/LOWER EXTREMITY;  Surgeon: Waynetta Sandy, MD;  Location: [place] [facility];  Service: Cardiovascular;  Laterality: Left;  . ABDOMINAL HYSTERECTOMY    . AMPUTATION Right 07/30/2016   Procedure: AMPUTATION RIGHT SECOND TOE;  Surgeon: Angelia Mould, MD;  Location: [place];  Service: Vascular;  Laterality: Right;  . CARDIAC CATHETERIZATION     2005 at [place] N/A 12/25/2018   Procedure: COLONOSCOPY WITH PROPOFOL;  Surgeon: Jackquline Denmark, MD;  Location: [facility];  Service: Endoscopy;  Laterality: N/A;  . ESOPHAGEAL BANDING  12/25/2018   Procedure: ESOPHAGEAL BANDING;  Surgeon: Jackquline Denmark, MD;  Location: [facility];  Service: Endoscopy;;  . ESOPHAGEAL BANDING   05/01/2019   Procedure: ESOPHAGEAL BANDING;  Surgeon: Jackquline Denmark, MD;  Location: [facility];  Service: Endoscopy;;  . ESOPHAGOGASTRODUODENOSCOPY (EGD) WITH PROPOFOL N/A 12/25/2018   Procedure: ESOPHAGOGASTRODUODENOSCOPY (EGD) WITH PROPOFOL;  Surgeon: Jackquline Denmark, MD;  [facility];  Service: Endoscopy;  Laterality: N/A;  . ESOPHAGOGASTRODUODENOSCOPY (EGD) WITH PROPOFOL N/A 05/01/2019   Procedure: ESOPHAGOGASTRODUODENOSCOPY (EGD) WITH PROPOFOL;  Surgeon: Jackquline Denmark, MD;  [facility];  Service: Endoscopy;  Laterality: N/A;  . LOWER EXTREMITY ANGIOGRAPHY Right 05/14/2019   Procedure: LOWER EXTREMITY ANGIOGRAPHY;  Surgeon: Waynetta Sandy, MD;  Location: [place] [facility];  Service: Cardiovascular;  Laterality: Right;  . PERIPHERAL VASCULAR ATHERECTOMY Right 04/29/2016   Procedure: Peripheral Vascular Atherectomy-Right Popliteal;  Surgeon: Waynetta Sandy, MD;  Location: [place] [facility];  Service: Cardiovascular;  Laterality: Right;  . PERIPHERAL VASCULAR ATHERECTOMY Right 05/14/2019   Procedure: PERIPHERAL VASCULAR ATHERECTOMY;  Surgeon: Waynetta Sandy, MD;  Location: [place] [facility];  Service: Cardiovascular;  Laterality: Right;  right SFA/pop  . PERIPHERAL VASCULAR BALLOON ANGIOPLASTY Left 08/27/2019   Procedure: PERIPHERAL VASCULAR BALLOON ANGIOPLASTY;  Surgeon: Waynetta Sandy, MD;  Location: [place] [facility];  Service: Cardiovascular;  Laterality: Left;  popliteal  . PERIPHERAL VASCULAR INTERVENTION Right 04/29/2016   Procedure: Peripheral Vascular Intervention-Right Popliteal;  Surgeon: Waynetta Sandy, MD;  Location: [place] [facility];  Service: Cardiovascular;  Laterality: Right;  POPLITEAL PTA  . PERIPHERAL VASCULAR INTERVENTION Right 05/14/2019   Procedure: PERIPHERAL VASCULAR INTERVENTION;  Surgeon: Waynetta Sandy, MD;  Location: [place] [facility];  Service: Cardiovascular;  Laterality: Right;   popliteal stent  . RADIOFREQUENCY ABLATION    . TOTAL KNEE ARTHROPLASTY    . TUBAL LIGATION      Family History  Problem Relation Age of Onset  . Diabetes Mother   . Hyperlipidemia Mother   . Diabetes Father   . Hyperlipidemia Father   . Heart disease Father        before age 47  . Heart attack Father   . Diabetes Brother   . Hyperlipidemia Brother   . Heart attack Brother     Allergies  Allergen Reactions  . Indomethacin Other (See Comments)    Renal Insufficiency  . Pregabalin Other (See Comments)    DIZZINESS   . Sulfa Antibiotics Hives  . Morphine And Related Nausea And Vomiting    Current Outpatient Medications on File Prior to Visit  Medication Sig Dispense Refill  . albuterol (VENTOLIN HFA) 108 (90 Base) MCG/ACT inhaler Inhale 2 puffs into the lungs every 6 (six) hours as needed for wheezing or shortness of breath.    . allopurinol (ZYLOPRIM) 100 MG tablet TAKE ONE TABLET BY MOUTH TWICE DAILY (Patient not taking: Reported on 09/18/2019) 60 tablet 3  . AMBULATORY NON FORMULARY MEDICATION Motorized scooter.  Diagnosis: Osteoarthritis M19.90 1 each 0  . amoxicillin-clavulanate (AUGMENTIN) 875-125 MG tablet Take 1 tablet by mouth 2 (two) times daily. (Patient not taking: Reported on 09/18/2019) 10 tablet 0  . atorvastatin (LIPITOR) 10 MG tablet Take 1 tablet (10 mg total) by mouth daily. 30 tablet 11  . cephALEXin (KEFLEX) 500 MG capsule Take 1 capsule (500 mg total) by mouth 2 (two) times daily. (Patient not taking: Reported on 09/18/2019) 20 capsule 0  . clopidogrel (PLAVIX) 75 MG tablet Take 1 tablet (75 mg total) by mouth daily. 30 tablet 11  . Continuous Blood Gluc Receiver (FREESTYLE LIBRE 14 DAY READER) DEVI Use as directed to monitor BG.  Dx E11.65    . Continuous Blood Gluc Sensor (FREESTYLE LIBRE 14 DAY SENSOR) MISC INJECT INTO THE SKIN EVERY 14 DAYS    . fluticasone (FLONASE) 50 MCG/ACT nasal spray Place 2 sprays into both nostrils at bedtime. (Patient taking  differently: Place 2 sprays into both nostrils in the morning and at bedtime. ) 16 g 3  . furosemide (LASIX) 20 MG tablet TAKE 2 TABLETS BY MOUTH EVERY MORNING AND 1 TABLET AT NOON (Patient taking differently: Take 20-40 mg by mouth See admin instructions. ) 270 tablet 1  . Insulin Aspart FlexPen 100 UNIT/ML SOPN Inject 0-10 Units into the skin daily as needed (If glucose if over 200). Sliding scale    . insulin degludec (TRESIBA) 100 UNIT/ML FlexTouch Pen Inject 14 Units into the skin  daily.     . Insulin Pen Needle (PEN NEEDLES 31GX5/16") 31G X 8 MM MISC Use as needed 4 times/day.  Dx E11.65    . levocetirizine (XYZAL) 5 MG tablet TAKE 1 TABLET BY MOUTH EVERY EVENING (Patient taking differently: Take 5 mg by mouth daily. ) 30 tablet 2  . levothyroxine (SYNTHROID) 150 MCG tablet Take 150 mcg by mouth daily before breakfast.     . losartan (COZAAR) 25 MG tablet TAKE ONE (1) TABLET BY MOUTH EVERY DAY (Patient taking differently: Take 25 mg by mouth daily. ) 90 tablet 1  . Melatonin 10 MG TABS Take 20 mg by mouth at bedtime.    . metoprolol succinate (TOPROL-XL) 25 MG 24 hr tablet TAKE ONE (1) TABLET BY MOUTH EVERY DAY (Patient taking differently: Take 25 mg by mouth daily. ) 90 tablet 1  . ondansetron (ZOFRAN-ODT) 4 MG disintegrating tablet Take 4 mg by mouth every 8 (eight) hours as needed for nausea or vomiting.     Glory Rosebush VERIO test strip 3 (three) times daily.    Marland Kitchen oxyCODONE (OXY IR/ROXICODONE) 5 MG immediate release tablet 1 tab po daily as needed for severe pain 30 tablet 0  . Prenatal Vit-Fe Fumarate-FA (MULTIVITAMIN-PRENATAL) 27-0.8 MG TABS tablet Take 1 tablet by mouth daily.     . Probiotic CAPS Take 2 capsules by mouth daily. 50 billion (gummy)    . RABEprazole (ACIPHEX) 20 MG tablet Take 1 tablet (20 mg total) by mouth daily. 30 tablet 1  . sertraline (ZOLOFT) 50 MG tablet Take 1 tablet (50 mg total) by mouth daily. 30 tablet 3  . spironolactone (ALDACTONE) 100 MG tablet Take 1 tablet  (100 mg total) by mouth daily. 30 tablet 6  . spironolactone (ALDACTONE) 50 MG tablet Take 1 tablet (50 mg total) by mouth daily. 30 tablet 11   No current facility-administered medications on file prior to visit.    BP 130/72   Pulse 80   Resp 20   Ht 5' 2"  (1.575 m)   Wt 190 lb (86.2 kg)   SpO2 98%   BMI 34.75 kg/m       Objective:   Physical Exam  General- No acute distress. Pleasant patient. Neck- Full range of motion, no jvd Lungs- Clear, even and unlabored. Heart- regular rate and rhythm. Neurologic- CNII- XII grossly intact.  Rt lower ext- rt foot mild slight hyperpigmented on top of foot. Rt great toe looks like healing well.      Assessment & Plan:  For recent left hip area pain, we got xray of hip today.  For rt foot pain, will get xray. Overall color of your foot shade hyperpigmented. Faint pulses felt. Toe in process healing. If any open wounds occur, darkening of skin or severe indication or extremes of warmth or cold extremity notify us. Any complications would need to get you back in with current vascular MD.  Will try to refer to new vascular practice.  For hip and foot pain can use low dose ibuprofen 200 mg every 8 hours if needed. Brief use 2-3 days. Use oxycodone once daily as needed.  Left cva area pain yesterday but now improving. Area of pain more in lower back. So probable muscle pain. Urine looks clear.  Follow up 10 days or as needed  Mackie Pai, PA-C   Time spent with patient today was 40  minutes which consisted of chart review, discussing diagnoses, work up, treatment, explaining referral process getting in new vasular  surgeon, answering questions  and documentation.

## 2019-09-18 NOTE — Telephone Encounter (Signed)
Saw pt today see office note.

## 2019-09-18 NOTE — Patient Instructions (Signed)
For recent left hip area pain, we got xray of hip today.  For rt foot pain, will get xray. Overall color of your foot shade hyperpigmented. Faint pulses felt. Toe in process healing. If any open wounds occur, darkening of skin or severe indication or extremes of warmth or cold extremity notify us. Any complications would need to get you back in with current vascular MD.  Will try to refer to new vascular practice.  For hip and foot pain can use low dose ibuprofen 200 mg every 8 hours if needed. Brief use 2-3 days. Use oxycodone once daily as needed.  Left cva area pain yesterday but now improving. Area of pain more in lower back. So probable muscle pain. Urine looks clear.  Follow up 10 days or as needed

## 2019-09-19 ENCOUNTER — Encounter: Payer: Self-pay | Admitting: Medical

## 2019-09-19 ENCOUNTER — Other Ambulatory Visit: Payer: Self-pay | Admitting: Medical

## 2019-09-19 DIAGNOSIS — L940 Localized scleroderma [morphea]: Secondary | ICD-10-CM | POA: Diagnosis not present

## 2019-09-19 LAB — COMPREHENSIVE METABOLIC PANEL
ALT: 27 IU/L (ref 0–32)
AST: 47 IU/L — ABNORMAL HIGH (ref 0–40)
Albumin/Globulin Ratio: 1.2 (ref 1.2–2.2)
Albumin: 3.7 g/dL — ABNORMAL LOW (ref 3.8–4.8)
Alkaline Phosphatase: 297 IU/L — ABNORMAL HIGH (ref 48–121)
BUN/Creatinine Ratio: 21 (ref 12–28)
BUN: 17 mg/dL (ref 8–27)
Bilirubin Total: 0.9 mg/dL (ref 0.0–1.2)
CO2: 25 mmol/L (ref 20–29)
Calcium: 9.6 mg/dL (ref 8.7–10.3)
Chloride: 97 mmol/L (ref 96–106)
Creatinine, Ser: 0.82 mg/dL (ref 0.57–1.00)
GFR calc Af Amer: 84 mL/min/{1.73_m2} (ref >59)
GFR calc non Af Amer: 73 mL/min/{1.73_m2} (ref >59)
Globulin, Total: 3.2 g/dL (ref 1.5–4.5)
Glucose: 196 mg/dL — ABNORMAL HIGH (ref 65–99)
Potassium: 4.2 mmol/L (ref 3.5–5.2)
Sodium: 138 mmol/L (ref 134–144)
Total Protein: 6.9 g/dL (ref 6.0–8.5)

## 2019-09-19 LAB — AFP TUMOR MARKER: AFP, Serum, Tumor Marker: 2.7 ng/mL (ref 0.0–8.3)

## 2019-09-19 LAB — PT AND PTT
INR: 1.1 (ref 0.9–1.2)
Prothrombin Time: 11.3 s (ref 9.1–12.0)
aPTT: 27 s (ref 24–33)

## 2019-09-19 LAB — AMMONIA: Ammonia: 147 ug/dL (ref 34–178)

## 2019-09-20 ENCOUNTER — Encounter: Payer: Self-pay | Admitting: Medical

## 2019-09-20 ENCOUNTER — Telehealth: Payer: Self-pay | Admitting: Medical

## 2019-09-20 DIAGNOSIS — M79671 Pain in right foot: Secondary | ICD-10-CM

## 2019-09-20 DIAGNOSIS — I739 Peripheral vascular disease, unspecified: Secondary | ICD-10-CM

## 2019-09-20 DIAGNOSIS — M8580 Other specified disorders of bone density and structure, unspecified site: Secondary | ICD-10-CM

## 2019-09-20 MED ORDER — CLINDAMYCIN HCL 150 MG PO CAPS
150.0000 mg | ORAL_CAPSULE | Freq: Three times a day (TID) | ORAL | 0 refills | Status: DC
Start: 2019-09-20 — End: 2019-09-21

## 2019-09-20 NOTE — Telephone Encounter (Signed)
Rx clindamycin sent to pt pharmacy.

## 2019-09-20 NOTE — Telephone Encounter (Signed)
I put in referral to Dr. Earleen Newport podiatrist. Reason is for bone lesion tip of toe. Can you send over referral and call to see if they can see quickly. I can call over if they want but they have xray information.   If they want me to call can you give me the number to the office.

## 2019-09-21 ENCOUNTER — Telehealth: Payer: Self-pay | Admitting: Medical

## 2019-09-21 DIAGNOSIS — L940 Localized scleroderma [morphea]: Secondary | ICD-10-CM | POA: Diagnosis not present

## 2019-09-21 MED ORDER — DOXYCYCLINE HYCLATE 100 MG PO TABS
100.0000 mg | ORAL_TABLET | Freq: Two times a day (BID) | ORAL | 0 refills | Status: DC
Start: 2019-09-21 — End: 2019-10-05

## 2019-09-21 NOTE — Telephone Encounter (Signed)
Rx doxcycline sent to pt pharmacy. Dc clindamycin due to reported severe side effect in past per pt. She never filled rx I sent yesterday.

## 2019-09-21 NOTE — Telephone Encounter (Signed)
Called pt and she states foot feels better. Picture of foot looks better as well. I prescribed doxycycline since xray mentioned possibility of small area of osteomyelitis. Pt can't take clindamyinc. Not convinced osteomyelitis but giving antibiotic to provide coverage. Asked pt to update me on Monday. If pain return will consider ct imaging.. Pt can't get mri.   She has appointment with new vascular surgeon early august. Appointment with podiatrist in September. May try to get podiatrist to see sooner if needed.  Asking Jarrett Soho to put clindamycin on her allergy list.

## 2019-09-21 NOTE — Telephone Encounter (Signed)
Opened to review 

## 2019-09-21 NOTE — Telephone Encounter (Signed)
Pt was scheduled for Thursday 10/04/2019 9:45am with Dr. Jacqualyn Posey as "urgent". You can call [facility] at [phone] if sooner appt is needed.

## 2019-09-22 NOTE — Telephone Encounter (Signed)
See telephone note on 09-21-2019 after talking to pt.

## 2019-09-24 ENCOUNTER — Ambulatory Visit: Payer: HMO

## 2019-09-24 ENCOUNTER — Encounter: Payer: Self-pay | Admitting: Medical

## 2019-09-24 ENCOUNTER — Ambulatory Visit: Payer: HMO | Admitting: Podiatry

## 2019-09-24 ENCOUNTER — Encounter (HOSPITAL_COMMUNITY): Payer: HMO

## 2019-09-24 ENCOUNTER — Telehealth: Payer: Self-pay | Admitting: Medical

## 2019-09-24 NOTE — Telephone Encounter (Signed)
Will you call pt an ask why they say she has not picked up doxycycline.

## 2019-09-24 NOTE — Telephone Encounter (Signed)
Caller: Danae Chen ([facility]) Call back # [phone]  FYI  Patient did not pick up antibiotic prescription.

## 2019-09-24 NOTE — Telephone Encounter (Signed)
Allergies updated

## 2019-09-25 ENCOUNTER — Encounter: Payer: Self-pay | Admitting: Podiatry

## 2019-09-25 ENCOUNTER — Encounter: Payer: Self-pay | Admitting: Medical

## 2019-09-25 NOTE — Telephone Encounter (Signed)
Called pt and lvm to return call.  

## 2019-09-25 NOTE — Telephone Encounter (Signed)
Patient states the copay for the medicine was $15 she doesn't have $15 until Friday.

## 2019-09-25 NOTE — Telephone Encounter (Signed)
I sent pt message. Looks like podiatrist office offered to see her on 09-26-2019. Ask her to call Dr. Jacqualyn Posey office first thing in morning and get scheduled.

## 2019-09-26 ENCOUNTER — Ambulatory Visit (INDEPENDENT_AMBULATORY_CARE_PROVIDER_SITE_OTHER): Payer: HMO | Admitting: Podiatrist

## 2019-09-26 ENCOUNTER — Other Ambulatory Visit: Payer: Self-pay

## 2019-09-26 ENCOUNTER — Encounter: Payer: Self-pay | Admitting: Podiatrist

## 2019-09-26 ENCOUNTER — Encounter: Payer: Self-pay | Admitting: Medical

## 2019-09-26 DIAGNOSIS — Z794 Long term (current) use of insulin: Secondary | ICD-10-CM

## 2019-09-26 DIAGNOSIS — L97511 Non-pressure chronic ulcer of other part of right foot limited to breakdown of skin: Secondary | ICD-10-CM | POA: Diagnosis not present

## 2019-09-26 DIAGNOSIS — L97509 Non-pressure chronic ulcer of other part of unspecified foot with unspecified severity: Secondary | ICD-10-CM

## 2019-09-26 DIAGNOSIS — E11621 Type 2 diabetes mellitus with foot ulcer: Secondary | ICD-10-CM

## 2019-09-26 DIAGNOSIS — I739 Peripheral vascular disease, unspecified: Secondary | ICD-10-CM

## 2019-09-26 NOTE — Patient Instructions (Signed)
Keep using the silver alginate-  If pus or drainage or redness occurs, call sooner to be seen.

## 2019-09-26 NOTE — Progress Notes (Signed)
Chief Complaint  Patient presents with  . Foot Ulcer    pt is here for a 3 week ulcer check of the right foot. Pt states that the right right big toe is possibly painful. Pt also states that the right big toe is worse than the left big toe.     Subjective: Alexis [name] presents to the office today for follow up evaluation of ulcer on her right hallux.  She relates the right big toe is getting more red and the right foot is cold.  She states she saw a vascular doctor at [facility] who worked on her stents 3 weeks ago  she states she has a follow up august 3rd for her blood flow and stents.  She has been continue with silver alginate dressing daily.   She currently denies any fevers, chills, nausea, vomiting.  No calf pain, chest pain, shortness of breath.    Denies any systemic complaints such as fevers, chills, nausea, vomiting. No acute changes since last appointment, and no other complaints at this time.    Objective: AAO x3, NAD eliptical ulceration present on the medial aspect of the right hallux with reepithelializing of the skin noted. measures 35m x 32mx 69m36mn depth. . There is no probing, undermining or tunneling.  Mild chronic erythema to the distal aspect of the toes bilaterally.  There is no warmth.  No ascending cellulitis.  No fluctuation crepitation.  There is no malodor. Non palpable pedal pulses noted with skin cool to touch on the distal digits bilaterally. No pain with calf compression, swelling, warmth, erythema     Assessment:     ICD-10-CM   1. Skin ulcer of toe of right foot, limited to breakdown of skin ([place]CC[place]mit ParkL97.511   2. PAD (peripheral artery disease) (HCC)  I73.9   3. Type 2 diabetes mellitus with foot ulcer, with long-term current use of insulin (HCC)  E11.621    L97.509    Z79.4    Plan:  Light debridement with a 15 blade carried out to slightly bleeding tissue.  iodosorb and a dry sterile dressing appled and she was instructed to continue using the silver alginate  at home.  She will follow up with vascular regarding her blood flow which we discussed is the ultimate way for her to heal as this area needs bloodflow to heal and at this time we are just trying to keep it from getting infected.  She demonstrates an understanding and will be seen back in 2 to 3 weeks for follow up- she will call sooner if she sees any pus or drainage from the wound or if the foot becomes red or swollen.

## 2019-09-26 NOTE — Telephone Encounter (Signed)
Pt.notified

## 2019-09-27 ENCOUNTER — Encounter: Payer: Self-pay | Admitting: Medical

## 2019-09-28 NOTE — Telephone Encounter (Signed)
Patient was seen in office on 09/26/19 by Dr. Valentina Lucks regarding the MyChart message sent from the patient.

## 2019-10-01 DIAGNOSIS — G609 Hereditary and idiopathic neuropathy, unspecified: Secondary | ICD-10-CM | POA: Diagnosis not present

## 2019-10-01 DIAGNOSIS — E11621 Type 2 diabetes mellitus with foot ulcer: Secondary | ICD-10-CM | POA: Diagnosis not present

## 2019-10-01 DIAGNOSIS — L97513 Non-pressure chronic ulcer of other part of right foot with necrosis of muscle: Secondary | ICD-10-CM | POA: Diagnosis not present

## 2019-10-01 DIAGNOSIS — L97523 Non-pressure chronic ulcer of other part of left foot with necrosis of muscle: Secondary | ICD-10-CM | POA: Diagnosis not present

## 2019-10-01 DIAGNOSIS — I509 Heart failure, unspecified: Secondary | ICD-10-CM | POA: Diagnosis not present

## 2019-10-01 DIAGNOSIS — Z794 Long term (current) use of insulin: Secondary | ICD-10-CM | POA: Diagnosis not present

## 2019-10-01 DIAGNOSIS — S31109D Unspecified open wound of abdominal wall, unspecified quadrant without penetration into peritoneal cavity, subsequent encounter: Secondary | ICD-10-CM | POA: Diagnosis not present

## 2019-10-01 DIAGNOSIS — I110 Hypertensive heart disease with heart failure: Secondary | ICD-10-CM | POA: Diagnosis not present

## 2019-10-01 DIAGNOSIS — I4891 Unspecified atrial fibrillation: Secondary | ICD-10-CM | POA: Diagnosis not present

## 2019-10-02 ENCOUNTER — Other Ambulatory Visit: Payer: Self-pay | Admitting: Medical

## 2019-10-02 DIAGNOSIS — T82898A Other specified complication of vascular prosthetic devices, implants and grafts, initial encounter: Secondary | ICD-10-CM | POA: Diagnosis not present

## 2019-10-02 DIAGNOSIS — I739 Peripheral vascular disease, unspecified: Secondary | ICD-10-CM | POA: Diagnosis not present

## 2019-10-02 NOTE — Telephone Encounter (Addendum)
Requesting: oxycodone Contract:08/07/19 UDS:07/27/19 Last Visit:09/18/19 Next Visit:n/a Last Refill:08/29/19  Please Advise  Refilled rx only 30 TAB RX.

## 2019-10-04 ENCOUNTER — Encounter: Payer: Self-pay | Admitting: Podiatry

## 2019-10-04 ENCOUNTER — Ambulatory Visit: Payer: HMO | Admitting: Podiatry

## 2019-10-05 ENCOUNTER — Other Ambulatory Visit: Payer: Self-pay | Admitting: *Deleted

## 2019-10-05 ENCOUNTER — Encounter: Payer: Self-pay | Admitting: Cardiology

## 2019-10-05 ENCOUNTER — Other Ambulatory Visit: Payer: Self-pay

## 2019-10-05 ENCOUNTER — Ambulatory Visit (INDEPENDENT_AMBULATORY_CARE_PROVIDER_SITE_OTHER): Payer: HMO | Admitting: Cardiology

## 2019-10-05 VITALS — BP 124/72 | HR 59 | Ht 62.0 in | Wt 188.0 lb

## 2019-10-05 DIAGNOSIS — Z950 Presence of cardiac pacemaker: Secondary | ICD-10-CM | POA: Diagnosis not present

## 2019-10-05 DIAGNOSIS — I4821 Permanent atrial fibrillation: Secondary | ICD-10-CM

## 2019-10-05 DIAGNOSIS — I739 Peripheral vascular disease, unspecified: Secondary | ICD-10-CM

## 2019-10-05 DIAGNOSIS — I10 Essential (primary) hypertension: Secondary | ICD-10-CM

## 2019-10-05 NOTE — Progress Notes (Signed)
Cardiology Office Note:    Date:  10/05/2019   ID:  QUINTA EIMER, DOB [birth year], MRN [id number]  PCP:  Mackie Pai, PA-C  Cardiologist:  Jenne Campus, MD    Referring MD: Elise Benne   No chief complaint on file. I am doing well cardiac wise  History of Present Illness:    Alexis [name] is a 69 y.o. female with past medical history significant for permanent atrial fibrillation, status post AV nodal ablation, status post pacemaker implantation which is Medtronic device, essential hypertension, diastolic congestive heart rate, cirrhosis of the liver, advanced peripheral vascular disease.  She comes today to my office for follow-up overall cardiac wise doing well.  Denies have any palpitation tightness squeezing pressure burning chest the biggest problem she has is ongoing problem with her lower extremities.  She did have intervention done in the left leg with some help however now the problem seems to be a healing ulcer on the right side.  She is looking for second opinion in vascular specialist in [place].  She does have follow-up appointment with them within next few months.  Overall she is trying to move around but have difficulty doing it because of neuropathy as well as pain in her legs.  Past Medical History:  Diagnosis Date  . Allergy   . Anxiety   . Arthritis   . Asthma   . Atrial fibrillation ([place])   . CHF (congestive heart failure) ([place])   . Cirrhosis ([place])   . COPD (chronic obstructive pulmonary disease) ([place])   . Depression 05/26/2019  . Diabetes mellitus without complication ([place])   . Hyperlipidemia   . Hypertension   . Macrocytic anemia 09/04/2018  . Myocardial infarction ([place])    several  . Pacemaker    CRT-P with RV lead and His Bundle lead ( high threshold)   . Peripheral neuropathy   . Pneumonia   . PONV (postoperative nausea and vomiting)    unsure of exactly what happened at [facility] in 2010 (knee surgery) that led to crital  care  . Thyroid disease     Past Surgical History:  Procedure Laterality Date  . ABDOMINAL AORTOGRAM W/LOWER EXTREMITY N/A 04/29/2016   Procedure: Abdominal Aortogram w/Lower Extremity;  Surgeon: Waynetta Sandy, MD;  Location: [place] [facility];  Service: Cardiovascular;  Laterality: N/A;  . ABDOMINAL AORTOGRAM W/LOWER EXTREMITY N/A 05/06/2017   Procedure: ABDOMINAL AORTOGRAM W/LOWER EXTREMITY;  Surgeon: Angelia Mould, MD;  Location: [place] [facility];  Service: Cardiovascular;  Laterality: N/A;  bilateral  . ABDOMINAL AORTOGRAM W/LOWER EXTREMITY Bilateral 05/04/2019   Procedure: ABDOMINAL AORTOGRAM W/LOWER EXTREMITY;  Surgeon: Angelia Mould, MD;  Location: [place] [facility];  Service: Cardiovascular;  Laterality: Bilateral;  . ABDOMINAL AORTOGRAM W/LOWER EXTREMITY Left 08/27/2019   Procedure: ABDOMINAL AORTOGRAM W/LOWER EXTREMITY;  Surgeon: Waynetta Sandy, MD;  Location: [place] [facility];  Service: Cardiovascular;  Laterality: Left;  . ABDOMINAL HYSTERECTOMY    . AMPUTATION Right 07/30/2016   Procedure: AMPUTATION RIGHT SECOND TOE;  Surgeon: Angelia Mould, MD;  Location: [place];  Service: Vascular;  Laterality: Right;  . CARDIAC CATHETERIZATION     2005 at [place] N/A 12/25/2018   Procedure: COLONOSCOPY WITH PROPOFOL;  Surgeon: Jackquline Denmark, MD;  [facility];  Service: Endoscopy;  Laterality: N/A;  . ESOPHAGEAL BANDING  12/25/2018   Procedure: ESOPHAGEAL BANDING;  Surgeon: Jackquline Denmark, MD;  [facility];  Service: Endoscopy;;  . ESOPHAGEAL BANDING  05/01/2019   Procedure: ESOPHAGEAL BANDING;  Surgeon: Jackquline Denmark, MD;  [facility];  Service: Endoscopy;;  . ESOPHAGOGASTRODUODENOSCOPY (EGD) WITH PROPOFOL N/A 12/25/2018   Procedure: ESOPHAGOGASTRODUODENOSCOPY (EGD) WITH PROPOFOL;  Surgeon: Jackquline Denmark, MD;  [facility];  Service: Endoscopy;  Laterality: N/A;  .  ESOPHAGOGASTRODUODENOSCOPY (EGD) WITH PROPOFOL N/A 05/01/2019   Procedure: ESOPHAGOGASTRODUODENOSCOPY (EGD) WITH PROPOFOL;  Surgeon: Jackquline Denmark, MD;  [facility];  Service: Endoscopy;  Laterality: N/A;  . LOWER EXTREMITY ANGIOGRAPHY Right 05/14/2019   Procedure: LOWER EXTREMITY ANGIOGRAPHY;  Surgeon: Waynetta Sandy, MD;  Location: [place] [facility];  Service: Cardiovascular;  Laterality: Right;  . PERIPHERAL VASCULAR ATHERECTOMY Right 04/29/2016   Procedure: Peripheral Vascular Atherectomy-Right Popliteal;  Surgeon: Waynetta Sandy, MD;  Location: [place] [facility];  Service: Cardiovascular;  Laterality: Right;  . PERIPHERAL VASCULAR ATHERECTOMY Right 05/14/2019   Procedure: PERIPHERAL VASCULAR ATHERECTOMY;  Surgeon: Waynetta Sandy, MD;  Location: [place] [facility];  Service: Cardiovascular;  Laterality: Right;  right SFA/pop  . PERIPHERAL VASCULAR BALLOON ANGIOPLASTY Left 08/27/2019   Procedure: PERIPHERAL VASCULAR BALLOON ANGIOPLASTY;  Surgeon: Waynetta Sandy, MD;  Location: [place] [facility];  Service: Cardiovascular;  Laterality: Left;  popliteal  . PERIPHERAL VASCULAR INTERVENTION Right 04/29/2016   Procedure: Peripheral Vascular Intervention-Right Popliteal;  Surgeon: Waynetta Sandy, MD;  Location: [place] [facility];  Service: Cardiovascular;  Laterality: Right;  POPLITEAL PTA  . PERIPHERAL VASCULAR INTERVENTION Right 05/14/2019   Procedure: PERIPHERAL VASCULAR INTERVENTION;  Surgeon: Waynetta Sandy, MD;  Location: [place] [facility];  Service: Cardiovascular;  Laterality: Right;  popliteal stent  . RADIOFREQUENCY ABLATION    . TOTAL KNEE ARTHROPLASTY    . TUBAL LIGATION      Current Medications: Current Meds  Medication Sig  . albuterol (VENTOLIN HFA) 108 (90 Base) MCG/ACT inhaler Inhale 2 puffs into the lungs every 6 (six) hours as needed for wheezing or shortness of breath.  . allopurinol (ZYLOPRIM) 100 MG tablet  TAKE ONE TABLET BY MOUTH TWICE DAILY  . AMBULATORY NON FORMULARY MEDICATION Motorized scooter.  Diagnosis: Osteoarthritis M19.90  . atorvastatin (LIPITOR) 10 MG tablet Take 1 tablet (10 mg total) by mouth daily.  . clopidogrel (PLAVIX) 75 MG tablet Take 1 tablet (75 mg total) by mouth daily.  . Continuous Blood Gluc Receiver (FREESTYLE LIBRE 14 DAY READER) DEVI Use as directed to monitor BG.  Dx E11.65  . Continuous Blood Gluc Sensor (FREESTYLE LIBRE 14 DAY SENSOR) MISC INJECT INTO THE SKIN EVERY 14 DAYS  . fluticasone (FLONASE) 50 MCG/ACT nasal spray Place 2 sprays into both nostrils at bedtime. (Patient taking differently: Place 2 sprays into both nostrils in the morning and at bedtime. )  . furosemide (LASIX) 20 MG tablet TAKE 2 TABLETS BY MOUTH EVERY MORNING AND 1 TABLET AT NOON (Patient taking differently: Take 20-40 mg by mouth See admin instructions. )  . Insulin Aspart FlexPen 100 UNIT/ML SOPN Inject 0-10 Units into the skin daily as needed (If glucose if over 200). Sliding scale  . insulin degludec (TRESIBA) 100 UNIT/ML FlexTouch Pen Inject 14 Units into the skin daily.   . Insulin Pen Needle (PEN NEEDLES 31GX5/16") 31G X 8 MM MISC Use as needed 4 times/day.  Dx E11.65  . levocetirizine (XYZAL) 5 MG tablet TAKE 1 TABLET BY MOUTH EVERY EVENING  . levothyroxine (SYNTHROID) 150 MCG tablet Take 150 mcg by mouth daily before breakfast.   .  losartan (COZAAR) 25 MG tablet TAKE ONE (1) TABLET BY MOUTH EVERY DAY (Patient taking differently: Take 25 mg by mouth daily. )  . Melatonin 10 MG TABS Take 20 mg by mouth at bedtime.  . metoprolol succinate (TOPROL-XL) 25 MG 24 hr tablet TAKE ONE (1) TABLET BY MOUTH EVERY DAY (Patient taking differently: Take 25 mg by mouth daily. )  . ondansetron (ZOFRAN-ODT) 4 MG disintegrating tablet Take 4 mg by mouth every 8 (eight) hours as needed for nausea or vomiting.   Glory Rosebush VERIO test strip 3 (three) times daily.  Marland Kitchen oxyCODONE (OXY IR/ROXICODONE) 5 MG  immediate release tablet TAKE ONE (1) TABLET BY MOUTH EVERY DAY AS NEEDED FOR SEVERE PAIN  . Prenatal Vit-Fe Fumarate-FA (MULTIVITAMIN-PRENATAL) 27-0.8 MG TABS tablet Take 1 tablet by mouth daily.   . Probiotic CAPS Take 2 capsules by mouth daily. 50 billion (gummy)  . RABEprazole (ACIPHEX) 20 MG tablet Take 1 tablet (20 mg total) by mouth daily.  . sertraline (ZOLOFT) 50 MG tablet Take 1 tablet (50 mg total) by mouth daily.  Marland Kitchen spironolactone (ALDACTONE) 100 MG tablet Take 1 tablet (100 mg total) by mouth daily.  Marland Kitchen triamcinolone ointment (KENALOG) 0.1 % Apply to     Allergies:   Indomethacin, Pregabalin, Sulfa antibiotics, Clindamycin/lincomycin, and Morphine and related   Social History   Socioeconomic History  . Marital status: Married    Spouse name: Juanda Crumble  . Number of children: 1  . Years of education: Not on file  . Highest education level: Not on file  Occupational History  . 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 Smoking status: Former Smoker    Types: Cigarettes    Quit date: 03/01/1998    Years since quitting: 21.6  . Smokeless tobacco: Never Used  Vaping Use  . Vaping Use: Never used  Substance and Sexual Activity  . 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  . Drug use: Not Currently    Types: Marijuana    Comment: remote use  . Sexual activity: Never  Other Topics Concern  . 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 Difficulty of Paying Living Expenses: Patient refused  Food Insecurity: No Food Insecurity  . Worried About Charity fundraiser  in the Last Year: Never true  . Ran Out of Food in the Last Year: Never true  Transportation Needs: No Transportation Needs  . Lack of Transportation (Medical): No  . Lack of Transportation (Non-Medical): No  Physical Activity: Unknown  . Days of Exercise per Week: 0 days  . Minutes of Exercise per Session: Not on file  Stress: No Stress Concern Present  . Feeling of Stress : Only a little  Social Connections: Unknown  . Frequency of Communication with Friends and Family: More than three times a week  . Frequency of Social Gatherings with Friends and Family: Not on file  . Attends Religious Services: Not on file  . Active Member of Clubs or Organizations: Not on file  . Attends  Club or Organization Meetings: Not on file  . Marital Status: Married     Family History: The patient's family history includes Diabetes in her brother, father, and mother; Heart attack in her brother and father; Heart disease in her father; Hyperlipidemia in her brother, father, and mother. ROS:   Please see the history of present illness.    All 14 point review of systems negative except as described per history of present illness  EKGs/Labs/Other Studies Reviewed:      Recent Labs: 02/09/2019: Magnesium 1.7 05/23/2019: Pro B Natriuretic peptide (BNP) 73.0 05/27/2019: Platelets 62 08/27/2019: Hemoglobin 13.9 09/18/2019: ALT 27; BUN 17; Creatinine, Ser 0.82; Potassium 4.2; Sodium 138  Recent Lipid Panel    Component Value Date/Time   CHOL 93 (L) 05/10/2018 0944   TRIG 147 05/10/2018 0944   HDL 31 (L) 05/10/2018 0944   CHOLHDL 3.0 05/10/2018 0944   CHOLHDL 6 04/10/2015 1040   VLDL 50.8 (H) 04/10/2015 1040   LDLCALC 33 05/10/2018 0944   LDLDIRECT 111.0 04/10/2015 1040    Physical Exam:    VS:  BP 124/72 (BP Location: Right Arm, Patient Position: Sitting, Cuff Size: Normal)   Pulse (!) 59   Ht 5' 2"  (1.575 m)   Wt 188 lb (85.3 kg)   SpO2 99%   BMI 34.39 kg/m     Wt Readings from Last 3  Encounters:  10/05/19 188 lb (85.3 kg)  09/18/19 190 lb (86.2 kg)  09/07/19 197 lb (89.4 kg)     GEN:  Well nourished, well developed in no acute distress HEENT: Normal NECK: No JVD; No carotid bruits LYMPHATICS: No lymphadenopathy CARDIAC: RRR, no murmurs, no rubs, no gallops RESPIRATORY:  Clear to auscultation without rales, wheezing or rhonchi  ABDOMEN: Soft, non-tender, non-distended MUSCULOSKELETAL:  No edema; No deformity  SKIN: Warm and dry LOWER EXTREMITIES: Distal pulses are absent.  Her legs are cold.  Skin is atrophic, she does have a nonhealing ulcer on the right toe however she did not allow me to unwrap it. NEUROLOGIC:  Alert and oriented x 3 PSYCHIATRIC:  Normal affect   ASSESSMENT:    1. Permanent atrial fibrillation ([place])   2. Peripheral vascular disease ([place])   3. Essential hypertension   4. Pacemaker    PLAN:    In order of problems listed above:  1. Permanent atrial fibrillation.  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  I think she would be rather poor candidate for watchman device but this is something we can reconsider. 2. Advanced peripheral vascular disease.  That being follow-up by vascular [facility] right now.  She still has nonhealing ulcer in the right big toe.  However because of advanced problem the favor is medical therapy.  She does have chronic limb ischemia. 3. Essential hypertension: Blood pressure seems to be well controlled continue present management. 4.  Dyslipidemia: She is on very small dose of statin because of cirrhosis of the liver. Pacemaker present: That is a Medtronic device.  We will make arrangements for interrogation of the device.  Last interrogation done more than 6 months ago showed normal functioning device.   Medication Adjustments/Labs and Tests Ordered: Current medicines are reviewed at length with the patient today.  Concerns  regarding medicines are outlined above.  No orders of the defined types were placed in this encounter.  Medication changes: No orders of the defined types were placed in this encounter.  Signed, Park Liter, MD, [facility] 10/05/2019 2:53 PM    [place]

## 2019-10-05 NOTE — Patient Instructions (Signed)
Medication Instructions:  Your physician recommends that you continue on your current medications as directed. Please refer to the Current Medication list given to you today.  *If you need a refill on your cardiac medications before your next appointment, please call your pharmacy*   Lab Work: None ordered If you have labs (blood work) drawn today and your tests are completely normal, you will receive your results only by: Marland Kitchen MyChart Message (if you have MyChart) OR . A paper copy in the mail If you have any lab test that is abnormal or we need to change your treatment, we will call you to review the results.   Testing/Procedures: Your physician has requested that you have an echocardiogram. Echocardiography is a painless test that uses sound waves to create images of your heart. It provides your doctor with information about the size and shape of your heart and how well your heart's chambers and valves are working. This procedure takes approximately one hour. There are no restrictions for this procedure.     Follow-Up: At [facility], you and your health needs are our priority.  As part of our continuing mission to provide you with exceptional heart care, we have created designated Provider Care Teams.  These Care Teams include your primary Cardiologist (physician) and Advanced Practice Providers (APPs -  Physician Assistants and Nurse Practitioners) who all work together to provide you with the care you need, when you need it.  We recommend signing up for the patient portal called "MyChart".  Sign up information is provided on this After Visit Summary.  MyChart is used to connect with patients for Virtual Visits (Telemedicine).  Patients are able to view lab/test results, encounter notes, upcoming appointments, etc.  Non-urgent messages can be sent to your provider as well.   To learn more about what you can do with MyChart, go to [email or web address].    Your next appointment:   5  month(s)  The format for your next appointment:   In Person  Provider:   Jenne Campus, MD   Other Instructions  Echocardiogram An echocardiogram is a procedure that uses painless sound waves (ultrasound) to produce an image of the heart. Images from an echocardiogram can provide important information about:  Signs of coronary artery disease (CAD).  Aneurysm detection. An aneurysm is a weak or damaged part of an artery wall that bulges out from the normal force of blood pumping through the body.  Heart size and shape. Changes in the size or shape of the heart can be associated with certain conditions, including heart failure, aneurysm, and CAD.  Heart muscle function.  Heart valve function.  Signs of a past heart attack.  Fluid buildup around the heart.  Thickening of the heart muscle.  A tumor or infectious growth around the heart valves. Tell a health care provider about:  Any allergies you have.  All medicines you are taking, including vitamins, herbs, eye drops, creams, and over-the-counter medicines.  Any blood disorders you have.  Any surgeries you have had.  Any medical conditions you have.  Whether you are pregnant or may be pregnant. What are the risks? Generally, this is a safe procedure. However, problems may occur, including:  Allergic reaction to dye (contrast) that may be used during the procedure. What happens before the procedure? No specific preparation is needed. You may eat and drink normally. What happens during the procedure?   An IV tube may be inserted into one of your veins.  You may receive  contrast through this tube. A contrast is an injection that improves the quality of the pictures from your heart.  A gel will be applied to your chest.  A wand-like tool (transducer) will be moved over your chest. The gel will help to transmit the sound waves from the transducer.  The sound waves will harmlessly bounce off of your heart to  allow the heart images to be captured in real-time motion. The images will be recorded on a computer. The procedure may vary among health care providers and hospitals. What happens after the procedure?  You may return to your normal, everyday life, including diet, activities, and medicines, unless your health care provider tells you not to do that. Summary  An echocardiogram is a procedure that uses painless sound waves (ultrasound) to produce an image of the heart.  Images from an echocardiogram can provide important information about the size and shape of your heart, heart muscle function, heart valve function, and fluid buildup around your heart.  You do not need to do anything to prepare before this procedure. You may eat and drink normally.  After the echocardiogram is completed, you may return to your normal, everyday life, unless your health care provider tells you not to do that. This information is not intended to replace advice given to you by your health care provider. Make sure you discuss any questions you have with your health care provider. Document Revised: 06/08/2018 Document Reviewed: 03/20/2016 Elsevier Patient Education  [place].

## 2019-10-08 ENCOUNTER — Ambulatory Visit (INDEPENDENT_AMBULATORY_CARE_PROVIDER_SITE_OTHER): Payer: HMO

## 2019-10-08 ENCOUNTER — Ambulatory Visit (INDEPENDENT_AMBULATORY_CARE_PROVIDER_SITE_OTHER): Payer: HMO | Admitting: Podiatry

## 2019-10-08 ENCOUNTER — Other Ambulatory Visit: Payer: Self-pay

## 2019-10-08 DIAGNOSIS — L97511 Non-pressure chronic ulcer of other part of right foot limited to breakdown of skin: Secondary | ICD-10-CM | POA: Diagnosis not present

## 2019-10-08 DIAGNOSIS — M79675 Pain in left toe(s): Secondary | ICD-10-CM

## 2019-10-08 DIAGNOSIS — L03031 Cellulitis of right toe: Secondary | ICD-10-CM

## 2019-10-08 DIAGNOSIS — M79674 Pain in right toe(s): Secondary | ICD-10-CM

## 2019-10-08 DIAGNOSIS — Z794 Long term (current) use of insulin: Secondary | ICD-10-CM

## 2019-10-08 DIAGNOSIS — Z7901 Long term (current) use of anticoagulants: Secondary | ICD-10-CM

## 2019-10-08 DIAGNOSIS — I739 Peripheral vascular disease, unspecified: Secondary | ICD-10-CM

## 2019-10-08 DIAGNOSIS — E11621 Type 2 diabetes mellitus with foot ulcer: Secondary | ICD-10-CM

## 2019-10-08 DIAGNOSIS — L02611 Cutaneous abscess of right foot: Secondary | ICD-10-CM | POA: Diagnosis not present

## 2019-10-08 DIAGNOSIS — B351 Tinea unguium: Secondary | ICD-10-CM | POA: Diagnosis not present

## 2019-10-08 MED ORDER — CEPHALEXIN 500 MG PO CAPS: 500.0000 mg | ORAL_CAPSULE | Freq: Three times a day (TID) | ORAL | 0 refills | Status: DC

## 2019-10-09 ENCOUNTER — Telehealth: Payer: Self-pay | Admitting: Podiatry

## 2019-10-09 ENCOUNTER — Encounter: Payer: Self-pay | Admitting: Podiatry

## 2019-10-09 NOTE — Telephone Encounter (Signed)
Faxed Dr. Leigh Aurora 10/08/2019 orders for daily dressing changes with Medihoney to right foot and sent to pt's MyChart.

## 2019-10-09 NOTE — Telephone Encounter (Signed)
Debbie from [facility] called needed wound orders please call [phone]

## 2019-10-09 NOTE — Progress Notes (Signed)
Subjective: Alexis [name] presents the office today for follow up evaluation of an ulceration to the medial aspect the right big toe.  She states the wound did heal however it has opened back up.  She was last seen here by Dr. Rolley Sims July 28 and at that point the wound was present and Iodosorb was applied followed by dressing.  She has been using a silver alginate at home but this has not been helping.  She has follow-up with [facility] vascular surgery for second opinion.  She is also followed with her primary care physician x-rays were performed which were worrisome for possible osteomyelitis the distal aspect of the hallux.  Also asking for nails be trimmed states they are thick and elongated she cannot do them herself.  She currently denies any fevers, chills, nausea, vomiting.  No calf pain, chest pain, shortness of breath.   Objective: AAO x3, NAD The wound at the distal aspect of the toe is healed.  There is an ulceration medial hallux on the IPJ with granular wound base and superficial.  Swelling clear drainage there is no purulence there is no fluctuation crepitation.  It is larger compared to her last appointment measuring approximately 1.2 x 0.5 x 0.2 cm.  There is localized surrounding erythema but there is no ascending cellulitis.  No fluctuation crepitation. Nails on her left foot third through fifth toes been her right foot third through fifth toes are hypertrophic, dystrophic with yellow discoloration.  Nails are causing discomfort inside shoes. No other open lesions otherwise No pain with calf compression, swelling, warmth, erythema   Assessment: 69 year old female with ulceration right foot localized erythema; symptomatic onychomycosis  Plan: -All treatment options discussed with the patient including all alternatives, risks, complications. -Repeat x-rays obtained reviewed.  Appears to be unchanged compared to the prior x-rays.  I thought this is an acute osteomyelitis but will monitor.   The wound to the distal portion of the toe is healed. -Sharply debrided the wound on the medial hallux without any complications utilizing the 312 with scalpel.  We will switch to using Medihoney daily which I gave her some today.  Continue daily dressing changes.  Prescribed Keflex.  Continue offloading at all times.  Recommended return to the surgical shoe. Monitor for any clinical signs or symptoms of infection and directed to call the office immediately should any occur or go to the ER. -Debrided nails x 6 without complications or bleeding  Trula Slade DPM

## 2019-10-09 NOTE — Telephone Encounter (Signed)
I informed [facility] - Debbie of Dr. Leigh Aurora orders to dress right foot ulcer with Medihoney daily cover with a dry sterile dressing, may teach caregiver to perform if [place] not available. Debbie asked if okay to use with a non-stick dressing, and I okayed.

## 2019-10-12 ENCOUNTER — Ambulatory Visit (HOSPITAL_COMMUNITY): Payer: HMO

## 2019-10-12 ENCOUNTER — Other Ambulatory Visit: Payer: Self-pay | Admitting: Podiatry

## 2019-10-12 ENCOUNTER — Encounter (HOSPITAL_COMMUNITY): Payer: HMO

## 2019-10-12 MED ORDER — TRAMADOL HCL 50 MG PO TABS: 50.0000 mg | ORAL_TABLET | Freq: Two times a day (BID) | ORAL | 0 refills | Status: AC | PRN

## 2019-10-14 DIAGNOSIS — L98499 Non-pressure chronic ulcer of skin of other sites with unspecified severity: Secondary | ICD-10-CM | POA: Diagnosis not present

## 2019-10-14 DIAGNOSIS — I272 Pulmonary hypertension, unspecified: Secondary | ICD-10-CM | POA: Diagnosis not present

## 2019-10-14 DIAGNOSIS — I70201 Unspecified atherosclerosis of native arteries of extremities, right leg: Secondary | ICD-10-CM | POA: Diagnosis not present

## 2019-10-14 DIAGNOSIS — E871 Hypo-osmolality and hyponatremia: Secondary | ICD-10-CM | POA: Diagnosis not present

## 2019-10-14 DIAGNOSIS — E11621 Type 2 diabetes mellitus with foot ulcer: Secondary | ICD-10-CM | POA: Diagnosis not present

## 2019-10-14 DIAGNOSIS — H2513 Age-related nuclear cataract, bilateral: Secondary | ICD-10-CM | POA: Diagnosis not present

## 2019-10-14 DIAGNOSIS — L97519 Non-pressure chronic ulcer of other part of right foot with unspecified severity: Secondary | ICD-10-CM | POA: Diagnosis not present

## 2019-10-14 DIAGNOSIS — G5710 Meralgia paresthetica, unspecified lower limb: Secondary | ICD-10-CM | POA: Diagnosis not present

## 2019-10-14 DIAGNOSIS — I70261 Atherosclerosis of native arteries of extremities with gangrene, right leg: Secondary | ICD-10-CM | POA: Diagnosis not present

## 2019-10-14 DIAGNOSIS — M7989 Other specified soft tissue disorders: Secondary | ICD-10-CM | POA: Diagnosis not present

## 2019-10-14 DIAGNOSIS — T82898A Other specified complication of vascular prosthetic devices, implants and grafts, initial encounter: Secondary | ICD-10-CM | POA: Diagnosis not present

## 2019-10-14 DIAGNOSIS — Z66 Do not resuscitate: Secondary | ICD-10-CM | POA: Diagnosis not present

## 2019-10-14 DIAGNOSIS — I517 Cardiomegaly: Secondary | ICD-10-CM | POA: Diagnosis not present

## 2019-10-14 DIAGNOSIS — I739 Peripheral vascular disease, unspecified: Secondary | ICD-10-CM | POA: Diagnosis not present

## 2019-10-14 DIAGNOSIS — E1149 Type 2 diabetes mellitus with other diabetic neurological complication: Secondary | ICD-10-CM | POA: Diagnosis not present

## 2019-10-14 DIAGNOSIS — Z049 Encounter for examination and observation for unspecified reason: Secondary | ICD-10-CM | POA: Diagnosis not present

## 2019-10-14 DIAGNOSIS — I4821 Permanent atrial fibrillation: Secondary | ICD-10-CM | POA: Diagnosis not present

## 2019-10-14 DIAGNOSIS — L03031 Cellulitis of right toe: Secondary | ICD-10-CM | POA: Diagnosis not present

## 2019-10-14 DIAGNOSIS — I8510 Secondary esophageal varices without bleeding: Secondary | ICD-10-CM | POA: Diagnosis not present

## 2019-10-14 DIAGNOSIS — E1152 Type 2 diabetes mellitus with diabetic peripheral angiopathy with gangrene: Secondary | ICD-10-CM | POA: Diagnosis not present

## 2019-10-14 DIAGNOSIS — I110 Hypertensive heart disease with heart failure: Secondary | ICD-10-CM | POA: Diagnosis not present

## 2019-10-14 DIAGNOSIS — E1136 Type 2 diabetes mellitus with diabetic cataract: Secondary | ICD-10-CM | POA: Diagnosis not present

## 2019-10-14 DIAGNOSIS — E1165 Type 2 diabetes mellitus with hyperglycemia: Secondary | ICD-10-CM | POA: Diagnosis not present

## 2019-10-14 DIAGNOSIS — M069 Rheumatoid arthritis, unspecified: Secondary | ICD-10-CM | POA: Diagnosis not present

## 2019-10-14 DIAGNOSIS — I509 Heart failure, unspecified: Secondary | ICD-10-CM | POA: Diagnosis not present

## 2019-10-14 DIAGNOSIS — M79674 Pain in right toe(s): Secondary | ICD-10-CM | POA: Diagnosis not present

## 2019-10-14 DIAGNOSIS — Z01810 Encounter for preprocedural cardiovascular examination: Secondary | ICD-10-CM | POA: Diagnosis not present

## 2019-10-14 DIAGNOSIS — E1122 Type 2 diabetes mellitus with diabetic chronic kidney disease: Secondary | ICD-10-CM | POA: Diagnosis not present

## 2019-10-14 DIAGNOSIS — I130 Hypertensive heart and chronic kidney disease with heart failure and stage 1 through stage 4 chronic kidney disease, or unspecified chronic kidney disease: Secondary | ICD-10-CM | POA: Diagnosis not present

## 2019-10-14 DIAGNOSIS — K7460 Unspecified cirrhosis of liver: Secondary | ICD-10-CM | POA: Diagnosis not present

## 2019-10-14 DIAGNOSIS — I429 Cardiomyopathy, unspecified: Secondary | ICD-10-CM | POA: Diagnosis not present

## 2019-10-14 DIAGNOSIS — Z794 Long term (current) use of insulin: Secondary | ICD-10-CM | POA: Diagnosis not present

## 2019-10-14 DIAGNOSIS — A480 Gas gangrene: Secondary | ICD-10-CM | POA: Diagnosis not present

## 2019-10-14 DIAGNOSIS — I70268 Atherosclerosis of native arteries of extremities with gangrene, other extremity: Secondary | ICD-10-CM | POA: Diagnosis not present

## 2019-10-14 DIAGNOSIS — E1151 Type 2 diabetes mellitus with diabetic peripheral angiopathy without gangrene: Secondary | ICD-10-CM | POA: Diagnosis not present

## 2019-10-14 DIAGNOSIS — I5022 Chronic systolic (congestive) heart failure: Secondary | ICD-10-CM | POA: Diagnosis not present

## 2019-10-14 DIAGNOSIS — E1142 Type 2 diabetes mellitus with diabetic polyneuropathy: Secondary | ICD-10-CM | POA: Diagnosis not present

## 2019-10-14 DIAGNOSIS — D62 Acute posthemorrhagic anemia: Secondary | ICD-10-CM | POA: Diagnosis not present

## 2019-10-14 DIAGNOSIS — M79671 Pain in right foot: Secondary | ICD-10-CM | POA: Diagnosis not present

## 2019-10-14 DIAGNOSIS — I2510 Atherosclerotic heart disease of native coronary artery without angina pectoris: Secondary | ICD-10-CM | POA: Diagnosis not present

## 2019-10-14 DIAGNOSIS — Z95820 Peripheral vascular angioplasty status with implants and grafts: Secondary | ICD-10-CM | POA: Diagnosis not present

## 2019-10-14 DIAGNOSIS — E890 Postprocedural hypothyroidism: Secondary | ICD-10-CM | POA: Diagnosis not present

## 2019-10-14 DIAGNOSIS — N179 Acute kidney failure, unspecified: Secondary | ICD-10-CM | POA: Diagnosis not present

## 2019-10-14 DIAGNOSIS — I361 Nonrheumatic tricuspid (valve) insufficiency: Secondary | ICD-10-CM | POA: Diagnosis not present

## 2019-10-14 DIAGNOSIS — B952 Enterococcus as the cause of diseases classified elsewhere: Secondary | ICD-10-CM | POA: Diagnosis not present

## 2019-10-14 DIAGNOSIS — B957 Other staphylococcus as the cause of diseases classified elsewhere: Secondary | ICD-10-CM | POA: Diagnosis not present

## 2019-10-14 DIAGNOSIS — E46 Unspecified protein-calorie malnutrition: Secondary | ICD-10-CM | POA: Diagnosis not present

## 2019-10-16 ENCOUNTER — Other Ambulatory Visit: Payer: Self-pay

## 2019-10-16 ENCOUNTER — Encounter: Payer: Self-pay | Admitting: Medical

## 2019-10-16 NOTE — Patient Outreach (Signed)
  [facility]) Care Management Chronic Special Needs Program    10/16/2019  Name: Alexis [name], DOB: [birth year]  MRN: [id number]   Ms. [name] is enrolled in a chronic special needs plan for Diabetes. Notification received, Client admitted to [facility] [place] [facility] for Right great toe ulcer infection. Individualized care plan sent to the utilization management department at [facility].  PLAN; RNCM will continue to follow.   Quinn Plowman RN,BSN,CCM [facility] Care Management [phone]

## 2019-10-18 ENCOUNTER — Telehealth: Payer: Self-pay | Admitting: Medical

## 2019-10-18 ENCOUNTER — Telehealth: Payer: Self-pay | Admitting: Podiatry

## 2019-10-18 ENCOUNTER — Other Ambulatory Visit: Payer: Self-pay

## 2019-10-18 ENCOUNTER — Encounter: Payer: Self-pay | Admitting: Medical

## 2019-10-18 ENCOUNTER — Ambulatory Visit: Payer: HMO | Admitting: Podiatry

## 2019-10-18 DIAGNOSIS — R739 Hyperglycemia, unspecified: Secondary | ICD-10-CM | POA: Diagnosis not present

## 2019-10-18 DIAGNOSIS — I70209 Unspecified atherosclerosis of native arteries of extremities, unspecified extremity: Secondary | ICD-10-CM | POA: Diagnosis not present

## 2019-10-18 DIAGNOSIS — I4891 Unspecified atrial fibrillation: Secondary | ICD-10-CM | POA: Diagnosis not present

## 2019-10-18 DIAGNOSIS — Z794 Long term (current) use of insulin: Secondary | ICD-10-CM | POA: Diagnosis not present

## 2019-10-18 DIAGNOSIS — K7460 Unspecified cirrhosis of liver: Secondary | ICD-10-CM | POA: Diagnosis not present

## 2019-10-18 DIAGNOSIS — E1149 Type 2 diabetes mellitus with other diabetic neurological complication: Secondary | ICD-10-CM | POA: Diagnosis not present

## 2019-10-18 DIAGNOSIS — E1142 Type 2 diabetes mellitus with diabetic polyneuropathy: Secondary | ICD-10-CM | POA: Diagnosis not present

## 2019-10-18 DIAGNOSIS — I110 Hypertensive heart disease with heart failure: Secondary | ICD-10-CM | POA: Diagnosis not present

## 2019-10-18 DIAGNOSIS — I8510 Secondary esophageal varices without bleeding: Secondary | ICD-10-CM | POA: Diagnosis not present

## 2019-10-18 DIAGNOSIS — F329 Major depressive disorder, single episode, unspecified: Secondary | ICD-10-CM | POA: Diagnosis not present

## 2019-10-18 DIAGNOSIS — E11621 Type 2 diabetes mellitus with foot ulcer: Secondary | ICD-10-CM | POA: Diagnosis not present

## 2019-10-18 DIAGNOSIS — E1152 Type 2 diabetes mellitus with diabetic peripheral angiopathy with gangrene: Secondary | ICD-10-CM | POA: Diagnosis not present

## 2019-10-18 DIAGNOSIS — I70261 Atherosclerosis of native arteries of extremities with gangrene, right leg: Secondary | ICD-10-CM | POA: Diagnosis not present

## 2019-10-18 DIAGNOSIS — I739 Peripheral vascular disease, unspecified: Secondary | ICD-10-CM | POA: Diagnosis not present

## 2019-10-18 DIAGNOSIS — I96 Gangrene, not elsewhere classified: Secondary | ICD-10-CM | POA: Diagnosis not present

## 2019-10-18 DIAGNOSIS — I429 Cardiomyopathy, unspecified: Secondary | ICD-10-CM | POA: Diagnosis not present

## 2019-10-18 DIAGNOSIS — D62 Acute posthemorrhagic anemia: Secondary | ICD-10-CM | POA: Diagnosis not present

## 2019-10-18 DIAGNOSIS — E46 Unspecified protein-calorie malnutrition: Secondary | ICD-10-CM | POA: Diagnosis not present

## 2019-10-18 DIAGNOSIS — H2513 Age-related nuclear cataract, bilateral: Secondary | ICD-10-CM | POA: Diagnosis not present

## 2019-10-18 DIAGNOSIS — I5022 Chronic systolic (congestive) heart failure: Secondary | ICD-10-CM | POA: Diagnosis not present

## 2019-10-18 DIAGNOSIS — I130 Hypertensive heart and chronic kidney disease with heart failure and stage 1 through stage 4 chronic kidney disease, or unspecified chronic kidney disease: Secondary | ICD-10-CM | POA: Diagnosis not present

## 2019-10-18 DIAGNOSIS — E1136 Type 2 diabetes mellitus with diabetic cataract: Secondary | ICD-10-CM | POA: Diagnosis not present

## 2019-10-18 DIAGNOSIS — I509 Heart failure, unspecified: Secondary | ICD-10-CM | POA: Diagnosis not present

## 2019-10-18 DIAGNOSIS — E1151 Type 2 diabetes mellitus with diabetic peripheral angiopathy without gangrene: Secondary | ICD-10-CM | POA: Diagnosis not present

## 2019-10-18 DIAGNOSIS — I4821 Permanent atrial fibrillation: Secondary | ICD-10-CM | POA: Diagnosis not present

## 2019-10-18 DIAGNOSIS — E871 Hypo-osmolality and hyponatremia: Secondary | ICD-10-CM | POA: Diagnosis not present

## 2019-10-18 DIAGNOSIS — E1165 Type 2 diabetes mellitus with hyperglycemia: Secondary | ICD-10-CM | POA: Diagnosis not present

## 2019-10-18 DIAGNOSIS — E1122 Type 2 diabetes mellitus with diabetic chronic kidney disease: Secondary | ICD-10-CM | POA: Diagnosis not present

## 2019-10-18 NOTE — Telephone Encounter (Signed)
Sent pt mychart message

## 2019-10-18 NOTE — Telephone Encounter (Signed)
ptis having her by pass surgery  today and wanted to update you

## 2019-10-18 NOTE — Telephone Encounter (Signed)
Caller Alazae Crymes  Call Back # [phone]  Patient states she is in [facility] and will have a bypass on her leg.

## 2019-10-18 NOTE — Telephone Encounter (Signed)
thanks

## 2019-10-19 ENCOUNTER — Other Ambulatory Visit: Payer: Self-pay

## 2019-10-19 NOTE — Patient Outreach (Signed)
  [facility]) Care Management Chronic Special Needs Program    10/19/2019  Name: Alexis [name], DOB: [birth year]  MRN: [id number]   Alexis [name] is enrolled in a chronic special needs plan for Diabetes. Notification received client transferred from [facility] to [facility] on 10/17/19 to have right lower extremity bypass.  Care plan sent to [facility] Utilization management department.   PLAN; RNCM will continue to follow.   Quinn Plowman RN,BSN,CCM [facility] Care Management [phone]

## 2019-10-24 ENCOUNTER — Other Ambulatory Visit: Payer: Self-pay

## 2019-10-24 ENCOUNTER — Telehealth: Payer: Self-pay | Admitting: Pharmacist

## 2019-10-24 NOTE — Progress Notes (Addendum)
Chronic Care Management Pharmacy Assistant   Name: Alexis [name]  MRN: [id number] DOB: [birth year]  Reason for Encounter: Disease State  Patient Questions:  1.  Have you seen any other providers since your last visit? Yes  2.  Any changes in your medicines or health? Yes    PCP : Mackie Pai, PA-C   Their chronic conditions include: DM, CHF, PVD, CHF, AFib, HTN, Hypothyroid, Osteoarthritis, Gout, HLD  Office Visits:  09-18-2019 (PCP) Patient presented in the office with Dr. Harvie Heck for follow up for left hip pain and right foot pain.An x-ray was obtained at the time of the visit for hip and right foot pain. Patient was prescribed 226m Ibuprofen to take every eight hours PRN.   Consults: 10-08-2019 (Podiatry) Patient presented in the office for follow up evaluation of an ulceration to the right big toe. Patient stated the wound on her toe did heal, but opened back up. She was prescribed Keflex, and her wound was dressed. Notes also indicate a surgical shoe was recommended  10-05-2019 (Cardiology) Patient presented in the office with Dr. KRejeana Brockfor follow up of permanent A-fib, s/p AV nodal ablation, s/p pacemaker implantation. The notes indicated patient still had a non healing ulcer on the right big toe. No medication changes.   10-02-2019 (Vascular Surgery) Patient presented in the office with Dr. DRosana Hoesfor a second opinion regarding PAD, with a primary concern of her right first toe. Medication changes include: discontinued cleocin 1572mcaps.  09-26-2019 ([place] in the office for follow up eval of ulcer on her right hallux. Patient stated the right big toe was getting more red and he right foot was cold. The notes indicate a light debridement was performed. Patient was to follow up with vascular regarding blood flow and was to be seen back in two to three weeks.   [facility]: [facility] with tissue loss. Patient presented in the OR on 10-18-19 for right femoral endarterectomy with profundaplasty and patch angioplasty and right femoral to posterior tibial artery bypass with non-reversed saphenous vein. Patient was prescribed the following medications at discharge: acetaminophen 500 MG tablet Take 1 tablet  by mouth every 6 (six) hours  docusate sodium 100 MG capsule Take 1 capsule by mouth daily for 7 days. methocarbamoL 500 MG tablet Take 1 tablet by mouth 3 times daily for 7 days. polyethylene glycol17 gram packet Take 17 g by mouth daily for 7 days. oxyCODONE 5 MG immediate release tablet Take 1 tablet by mouth every 6 (six) hours as needed for up to 7 days insulin degludec (TRESIBA FLEXTOUCH) 100 unit/mL (3 mL) injection Inject 20 Units into the skin daily. Inject as directed per sliding scale.  10-14-2019 Patient presented to the emergency deBeverlyollowing a sudden worsening of the right great toe arterial ulcer with dry gangrene and surrounding cellulitis     Allergies:   Allergies  Allergen Reactions   Indomethacin Other (See Comments)    Renal Insufficiency   Pregabalin Other (See Comments)    DIZZINESS    Sulfa Antibiotics Hives   Clindamycin/Lincomycin    Morphine And Related Nausea And Vomiting    Medications: Outpatient Encounter Medications as of 10/24/2019  Medication Sig Note   albuterol (VENTOLIN HFA) 108 (90 Base) MCG/ACT inhaler Inhale 2 puffs into the lungs every 6 (six) hours as needed for wheezing or  shortness of breath.    allopurinol (ZYLOPRIM) 100 MG tablet TAKE ONE TABLET BY MOUTH TWICE DAILY    AMBULATORY NON FORMULARY MEDICATION Motorized scooter.  Diagnosis: Osteoarthritis M19.90 06/05/2019: Reports her scooter and has some mechanical issues and a technician is scheduled to come and look at it this week.   atorvastatin (LIPITOR) 10 MG tablet Take 1 tablet (10 mg total) by  mouth daily.    cephALEXin (KEFLEX) 500 MG capsule Take 1 capsule (500 mg total) by mouth 3 (three) times daily.    clopidogrel (PLAVIX) 75 MG tablet Take 1 tablet (75 mg total) by mouth daily.    Continuous Blood Gluc Receiver (FREESTYLE LIBRE 14 DAY READER) DEVI Use as directed to monitor BG.  Dx E11.65    Continuous Blood Gluc Sensor (FREESTYLE LIBRE 14 DAY SENSOR) MISC INJECT INTO THE SKIN EVERY 14 DAYS    fluticasone (FLONASE) 50 MCG/ACT nasal spray Place 2 sprays into both nostrils at bedtime. (Patient taking differently: Place 2 sprays into both nostrils in the morning and at bedtime. )    furosemide (LASIX) 20 MG tablet TAKE 2 TABLETS BY MOUTH EVERY MORNING AND 1 TABLET AT NOON (Patient taking differently: Take 20-40 mg by mouth See admin instructions. )    Insulin Aspart FlexPen 100 UNIT/ML SOPN Inject 0-10 Units into the skin daily as needed (If glucose if over 200). Sliding scale    insulin degludec (TRESIBA) 100 UNIT/ML FlexTouch Pen Inject 14 Units into the skin daily.     Insulin Pen Needle (PEN NEEDLES 31GX5/16") 31G X 8 MM MISC Use as needed 4 times/day.  Dx E11.65    levocetirizine (XYZAL) 5 MG tablet TAKE 1 TABLET BY MOUTH EVERY EVENING    levothyroxine (SYNTHROID) 150 MCG tablet Take 150 mcg by mouth daily before breakfast.     losartan (COZAAR) 25 MG tablet TAKE ONE (1) TABLET BY MOUTH EVERY DAY (Patient taking differently: Take 25 mg by mouth daily. )    Melatonin 10 MG TABS Take 20 mg by mouth at bedtime.    metoprolol succinate (TOPROL-XL) 25 MG 24 hr tablet TAKE ONE (1) TABLET BY MOUTH EVERY DAY (Patient taking differently: Take 25 mg by mouth daily. )    ondansetron (ZOFRAN-ODT) 4 MG disintegrating tablet Take 4 mg by mouth every 8 (eight) hours as needed for nausea or vomiting.     ONETOUCH VERIO test strip 3 (three) times daily.    oxyCODONE (OXY IR/ROXICODONE) 5 MG immediate release tablet TAKE ONE (1) TABLET BY MOUTH EVERY DAY AS NEEDED FOR SEVERE PAIN    Prenatal  Vit-Fe Fumarate-FA (MULTIVITAMIN-PRENATAL) 27-0.8 MG TABS tablet Take 1 tablet by mouth daily.     Probiotic CAPS Take 2 capsules by mouth daily. 50 billion (gummy)    RABEprazole (ACIPHEX) 20 MG tablet Take 1 tablet (20 mg total) by mouth daily.    sertraline (ZOLOFT) 50 MG tablet Take 1 tablet (50 mg total) by mouth daily.    spironolactone (ALDACTONE) 100 MG tablet Take 1 tablet (100 mg total) by mouth daily.    triamcinolone ointment (KENALOG) 0.1 % Apply to    No facility-administered encounter medications on file as of 10/24/2019.    Current Diagnosis: Patient Active Problem List   Diagnosis Date Noted   Depression 05/26/2019   Cellulitis 05/26/2019   Other cirrhosis of liver ([place]) 16/11/9602   Chronic systolic CHF (congestive heart failure) ([place]) 05/26/2019   Cellulitis in diabetic foot ([place]) 05/25/2019   Epigastric pain  Idiopathic esophageal varices without bleeding (HCC)    Anemia due to chronic blood loss    Paroxysmal atrial fibrillation ([place]) 09/26/2018   Macrocytic anemia 09/04/2018   Pacemaker 08/25/2018   Insulin dependent diabetes mellitus with complications 98/33/8250   Constipation 08/25/2018   Fluid overload 08/25/2018   Chronic anticoagulation 08/25/2018   Morbid obesity ([place]) 08/25/2018   Chronic diastolic heart failure ([place]) 05/17/2018   Acute bronchitis with COPD ([place]) 01/16/2018   Acquired absence of other right toe(s) ([place]) 11/16/2017   Type 2 diabetes mellitus with foot ulcer ([place]) 11/16/2017   Crush injury of left foot, initial encounter 11/28/2016   Osteomyelitis ([place]) 07/30/2016   Gangrene of foot ([place]) 04/27/2016   Thrombocytopenia ([place])    Vomiting and diarrhea 04/05/2016   Hyperlipidemia 11/18/2015   Gout 08/05/2014   Cough 08/05/2014   Allergic rhinitis 07/12/2014   Osteoarthritis 07/12/2014   Asthma 07/12/2014   Congestive heart failure ([place]) 07/12/2014   Permanent atrial fibrillation ([place]) 07/12/2014   HTN (hypertension) 07/12/2014    Hypothyroidism 07/12/2014   History of substance abuse ([place]) 07/12/2014   Peripheral vascular disease ([place]) 04/02/2013   Discoloration of skin 04/02/2013    Goals Addressed   None    Recent Relevant Labs: Lab Results  Component Value Date/Time   HGBA1C 7.7 (H) 05/26/2019 04:49 PM   HGBA1C 9.0 04/03/2015 12:00 AM   MICROALBUR 0.3 04/10/2015 10:40 AM    Kidney Function Lab Results  Component Value Date/Time   CREATININE 0.82 09/18/2019 11:23 AM   CREATININE 1.20 (H) 08/27/2019 10:09 AM   CREATININE 0.76 10/10/2018 02:03 PM   CREATININE 1.03 (H) 09/08/2018 11:37 AM   GFR 57.41 (L) 05/23/2019 01:38 PM   GFRNONAA 73 09/18/2019 11:23 AM   GFRNONAA >60 10/10/2018 02:03 PM   GFRAA 84 09/18/2019 11:23 AM   GFRAA >60 10/10/2018 02:03 PM    Current antihyperglycemic regimen:    V-go or basal insulin regimen What recent interventions/DTPs have been made to improve glycemic control:  none Have there been any recent hospitalizations or ED visits since last visit with CPP? Yes Patient denies hypoglycemic symptoms, including None Patient denies hyperglycemic symptoms, including none How often are you checking your blood sugar? twice daily What are your blood sugars ranging? Patient states her blood sugar averages are in the 120s. It did increase to 200 while in the hospital because she had an apple sauce. She usually checks her blood sugar Before meals and two hours after meals. During the week, how often does your blood glucose drop below 70? Never Are you checking your feet daily/regularly? Yes  Adherence Review: Is the patient currently on a STATIN medication? Yes Is the patient currently on ACE/ARB medication? Yes Does the patient have >5 day gap between last estimated fill dates? Yes  Atorvastatin last filled 09/12/19 for 30 DS (hospitalizations could be contributor)   Follow-Up:  Scheduled Follow-Up With Clinical Pharmacist on Tuesday August 31st @ 11am (pharmacist unable to  schedule on this date. Will attempt to offer different appt date.)  Fanny Skates, [place] Pharmacist Assistant [phone]  Reviewed by: De Blanch, PharmD Clinical Pharmacist [facility] [phone]   I have collaborated with the care management provider regarding care management and care coordination activities outlined in this encounter and have reviewed this encounter including documentation in the note and care plan. I am certifying that I agree with the content of this note and encounter as supervising provider  Mackie Pai,  PA-C

## 2019-10-24 NOTE — Patient Outreach (Signed)
  [place] [facility]) Care Management Chronic Special Needs Program    10/24/2019  Name: Alexis [name], DOB: [birth year]  MRN: [id number]   Alexis [name] Name is enrolled in a chronic special needs plan for Diabetes. Client admitted to [place] [facility] on  10/14/19 for cellulitis of great right toe/ ulcer right foot discharged on [facility] on 10/23/19 status post Femoral distal bypass / Femoral artery endarterectomy with patch angioplast.  Reviewed and updated care plan.  Transition of care to be competed by [facility].   Goals Addressed            This Visit's Progress   . General - Client will not be readmitted within 30 days (C-SNP)       Please follow discharge instructions and call provider if you have any questions. Please attend all follow up appointments as scheduled. Please take your medications as prescribed. Please call 24 Hour nurse advice line as needed [phone]).          PLANS:  RNCM will send updated care plan to client and primary care provider. RNCM will follow up with client in 3 business days  Quinn Plowman RN,BSN,CCM [facility] [phone]

## 2019-10-25 ENCOUNTER — Encounter: Payer: Self-pay | Admitting: Podiatry

## 2019-10-25 ENCOUNTER — Encounter: Payer: Self-pay | Admitting: Medical

## 2019-10-25 ENCOUNTER — Telehealth: Payer: Self-pay | Admitting: Medical

## 2019-10-25 ENCOUNTER — Ambulatory Visit: Payer: HMO | Admitting: Podiatry

## 2019-10-25 NOTE — Telephone Encounter (Signed)
New message:    Pt is calling and states that she is needing a call back to discuss some pain medications. She states the Dr was supposed to receive a call from a Dr. Zigmund Daniel on yesterday in reference to her surgery she just had. Please advise.

## 2019-10-26 ENCOUNTER — Other Ambulatory Visit: Payer: Self-pay

## 2019-10-26 ENCOUNTER — Encounter: Payer: Self-pay | Admitting: Podiatry

## 2019-10-26 ENCOUNTER — Telehealth: Payer: Self-pay | Admitting: Medical

## 2019-10-26 NOTE — Telephone Encounter (Signed)
Patient called. Informed her of refill sent. She stated she will call pharmacy

## 2019-10-26 NOTE — Telephone Encounter (Signed)
Medication:oxyCODONE (OXY IR/ROXICODONE) 5 MG immediate release tablet [[id number]]   Has the patient contacted their pharmacy? No. (If no, request that the patient contact the pharmacy for the refill.) (If yes, when and what did the pharmacy advise?)  Preferred Pharmacy (with phone number or street name):  [place], [place] - [address]  [address], [place] [address]  Phone:  [phone] Fax:  [phone]  DEA #:  --        Agent: Please be advised that RX refills may take up to 3 business days. We ask that you follow-up with your pharmacy.

## 2019-10-26 NOTE — Patient Outreach (Addendum)
  [place] [facility]) Care Management Chronic Special Needs Program    10/26/2019  Name: Alexis [name], DOB: March 16, 1950  MRN: [id number]   Alexis [name] is enrolled in a chronic special needs plan for Diabetes. Telephone call to client for transition of care follow up call. HIPAA verified. Client states she has been experience post operative pain. She states she contacted the surgeon's office and a prescription has been called in and will be delivered today. Client states the [facility] home health nurse is scheduled to see her today.  Client states she does not have a follow up appointment scheduled with the surgeon. Client states her husband takes her to her doctors appointments.  Goals Addressed            This Visit's Progress   . Client will report decrease surgical pain in 1-2 months       Take your pain medication as prescribed. Schedule follow up appointment with your doctor Report signs / symptoms of infection: redness, drainage, increase swelling, drainage, odor from surgical site, fever to your doctor as soon as possible.  Activity as instructed by your doctor.    . client will report scheduling post hospital follow up appointment with her doctor in 2 months       Contact your doctor to schedule after hospital follow up appointment Contact your RN case manager if you are unable to secure transportation to your appointment Contact your RN case manager if you have difficulty scheduling your after hospital follow up appointment.     . General - Client will not be readmitted within 30 days (C-SNP)       Please follow discharge instructions and call provider if you have any questions. Please attend all follow up appointments as scheduled. Please take your medications as prescribed. Please call 24 Hour nurse advice line as needed [phone] Continue follow up with home health provider        PLAN; RNCM will follow up with client in 1 month      Quinn Plowman RN,BSN,CCM [facility] [phone]

## 2019-10-26 NOTE — Telephone Encounter (Signed)
Oxycodone refill.   Last OV: 09/18/2019 Last Fill: 10/02/2019 #30 and 0RF Pt sig: 1 tab qd prn UDS: 07/27/2019 Low risk

## 2019-10-26 NOTE — Progress Notes (Signed)
Received call from [place] with [facility]. The patient hit her toe on the bedside commode and there was some dried blood on the wound. The wound had been hard and eschar until this. We will switch to aquacel ag dressing every other day and verbal orders given. We will get her in to be seen next week in the office. She was just discharged form the hospital after undergoing bypass.

## 2019-10-31 DIAGNOSIS — L97523 Non-pressure chronic ulcer of other part of left foot with necrosis of muscle: Secondary | ICD-10-CM | POA: Diagnosis not present

## 2019-10-31 DIAGNOSIS — S31109D Unspecified open wound of abdominal wall, unspecified quadrant without penetration into peritoneal cavity, subsequent encounter: Secondary | ICD-10-CM | POA: Diagnosis not present

## 2019-10-31 DIAGNOSIS — G609 Hereditary and idiopathic neuropathy, unspecified: Secondary | ICD-10-CM | POA: Diagnosis not present

## 2019-10-31 DIAGNOSIS — I509 Heart failure, unspecified: Secondary | ICD-10-CM | POA: Diagnosis not present

## 2019-10-31 DIAGNOSIS — Z794 Long term (current) use of insulin: Secondary | ICD-10-CM | POA: Diagnosis not present

## 2019-10-31 DIAGNOSIS — E11621 Type 2 diabetes mellitus with foot ulcer: Secondary | ICD-10-CM | POA: Diagnosis not present

## 2019-10-31 DIAGNOSIS — L97513 Non-pressure chronic ulcer of other part of right foot with necrosis of muscle: Secondary | ICD-10-CM | POA: Diagnosis not present

## 2019-10-31 DIAGNOSIS — I110 Hypertensive heart disease with heart failure: Secondary | ICD-10-CM | POA: Diagnosis not present

## 2019-10-31 DIAGNOSIS — I4891 Unspecified atrial fibrillation: Secondary | ICD-10-CM | POA: Diagnosis not present

## 2019-11-06 ENCOUNTER — Other Ambulatory Visit: Payer: Self-pay

## 2019-11-06 ENCOUNTER — Encounter: Payer: Self-pay | Admitting: Podiatry

## 2019-11-06 ENCOUNTER — Encounter: Payer: Self-pay | Admitting: Medical

## 2019-11-07 ENCOUNTER — Ambulatory Visit (HOSPITAL_BASED_OUTPATIENT_CLINIC_OR_DEPARTMENT_OTHER): Payer: HMO | Attending: Cardiology

## 2019-11-07 DIAGNOSIS — I739 Peripheral vascular disease, unspecified: Secondary | ICD-10-CM | POA: Diagnosis not present

## 2019-11-07 NOTE — Patient Outreach (Addendum)
[place] [facility]) Care Management Chronic Special Needs Program  11/07/2019  Name: Alexis [name] DOB: January 21, 1951  MRN: [id number]  Alexis [name] is enrolled in a chronic special needs plan for Diabetes. Telephone call to client for CSNP assessment follow up. HIPAA verified. Client reports having pus and blood coming from her incision site near her groin area. Client states she has a follow up appointment scheduled with her surgeon on 11/07/19. She reports having transportation.  Client reports she continues to receive home health and [facility] visits. Client reports foot wounds are healing.     Goals Addressed              This Visit's Progress   .  ":Client states she wants to continue work on getting her A1c down." (pt-stated)   On track     Review [employer] calendar sent in the mail for diabetes action plan Plan to eat low carbohydrate and low salt meals, watch portion sizes and avoid sugar sweetened drinks.       .  A1c goal <7%   On track     Re-discussed diabetes self management actions:  Glucose monitoring per provider recommendation  Visit provider every 3-6 months as directed  Hbg A1C level every 3-6 months.  Carbohydrate controlled meal planning  Taking diabetes medication as prescribed by provider  Physical activity     .  Client verbalize knowledge of Heart Failure disease self management skills by 02/16/19   On track     Reviewed heart failure signs/ symptoms with client Continue to take your medications as prescribed.  Continue to attend doctor appointments Review Congestive heart failure: Action plan in [employer] team [employer] calendar RN case manager will send client education article: Heart failure ED      .  Client will increase activity tolerance within 3 months   On track     Continue to do home exercises as advised by physical therapy.  Continue to take prescribed pain medication as recommended by your doctor.      .   Client will report decrease surgical pain in 1-2 months   On track     Continue to take your pain medication as prescribed.  Follow up with your surgeon as recommended.  Notify your surgeon of increase symptoms    .  Client will report ongoing improvement in right foot wounds in 3 months.   On track     Client reports improvement in foot wounds  Client reports skilled nursing visits continue 2 times per week Continue to follow up with your doctor as recommended     .  client will report scheduling post hospital follow up appointment with her doctor in 2 months   On track     Client reports having a follow up visit scheduled with the vascular surgeon on 11/07/19.  Contact your RN case manager if you are unable to secure transportation to your appointment     .  COMPLETED: Client will verbalize knowledge of chronic lung disease as evidenced by no ED visits or Inpatient stays related to chronic lung disease         Client denies having any emergency department or hospitalizations related to chronic lung conditions.          .  Client will verbalize reporting infection like symptoms of surgical site to surgeon in 3 months.        Report infection like symptoms of surgical site to your surgeon  Client reports ongoing follow up from home health agency and [facility].     .  General - Client will not be readmitted within 30 days (C-SNP)   On track     Continue to follow discharge instructions and call provider if you have any questions. attend all follow up appointments as scheduled. take your medications as prescribed. call 24 Hour nurse advice line as needed [phone] Continue follow up with home health provider     .  HEMOGLOBIN A1C < 7        Per client recent A1c is 7.4 on 08/02/19 Check blood sugars daily before eating with a goal of 80-130.  You can also check 1 1/2 hours after eating with goal of 180 or less Plan to eat low carbohydrate and low salt meals, watch portion sizes  and avoid sugar sweetened drinks.   Review [employer] calendar sent in the mail for diabetes action plan.      .  COMPLETED: Maintain timely refills of diabetic medication as prescribed within the year .   On track     Client reports maintaining timely refills of her diabetic medication.  Client reports taking her diabetic medication as prescribed.     .  COMPLETED: Obtain annual  Lipid Profile, LDL-C   On track     Lipid completed 05/26/19     .  Obtain annual screen for micro albuminuria (urine) , nephropathy (kidney problems)   Not on track     No recent microalbumin noted in chart Discuss having an annual micro albuminuria screen done with your provider.             Plan:  Send successful outreach letter with a copy of their individualized care plan, Send individual care plan to provider and Send educational material  Chronic care management coordinator will outreach in:  6 Months   Quinn Plowman RN,BSN,CCM [place] [facility] [phone]     .

## 2019-11-12 ENCOUNTER — Ambulatory Visit (INDEPENDENT_AMBULATORY_CARE_PROVIDER_SITE_OTHER): Payer: HMO

## 2019-11-12 ENCOUNTER — Ambulatory Visit (INDEPENDENT_AMBULATORY_CARE_PROVIDER_SITE_OTHER): Payer: HMO | Admitting: Podiatry

## 2019-11-12 ENCOUNTER — Other Ambulatory Visit: Payer: Self-pay | Admitting: Cardiology

## 2019-11-12 ENCOUNTER — Other Ambulatory Visit: Payer: Self-pay

## 2019-11-12 ENCOUNTER — Ambulatory Visit: Payer: Self-pay | Admitting: Podiatry

## 2019-11-12 DIAGNOSIS — Z794 Long term (current) use of insulin: Secondary | ICD-10-CM | POA: Diagnosis not present

## 2019-11-12 DIAGNOSIS — I739 Peripheral vascular disease, unspecified: Secondary | ICD-10-CM

## 2019-11-12 DIAGNOSIS — L97511 Non-pressure chronic ulcer of other part of right foot limited to breakdown of skin: Secondary | ICD-10-CM | POA: Diagnosis not present

## 2019-11-12 DIAGNOSIS — L97509 Non-pressure chronic ulcer of other part of unspecified foot with unspecified severity: Secondary | ICD-10-CM | POA: Diagnosis not present

## 2019-11-12 DIAGNOSIS — M86171 Other acute osteomyelitis, right ankle and foot: Secondary | ICD-10-CM

## 2019-11-12 DIAGNOSIS — M79671 Pain in right foot: Secondary | ICD-10-CM | POA: Diagnosis not present

## 2019-11-12 DIAGNOSIS — M86172 Other acute osteomyelitis, left ankle and foot: Secondary | ICD-10-CM

## 2019-11-12 DIAGNOSIS — E11621 Type 2 diabetes mellitus with foot ulcer: Secondary | ICD-10-CM

## 2019-11-12 MED ORDER — DOXYCYCLINE HYCLATE 100 MG PO TABS: 100.0000 mg | ORAL_TABLET | Freq: Two times a day (BID) | ORAL | 0 refills | Status: DC

## 2019-11-13 ENCOUNTER — Encounter: Payer: HMO | Admitting: Podiatry

## 2019-11-13 DIAGNOSIS — T8189XA Other complications of procedures, not elsewhere classified, initial encounter: Secondary | ICD-10-CM | POA: Diagnosis not present

## 2019-11-13 NOTE — Progress Notes (Signed)
Subjective: Alexis [name] presents the office today for follow up evaluation of an ulceration to the medial aspect the right big toe.  Since I last saw her she has underwent a bypass of the right lower extremity performed at [facility].  She is concerned that the healing of the incision.  Otherwise the toe she states this area turned hard and she has been applying Vaseline to the wound and since then the wound is becoming moist on the toe.  After discharge from the hospital she was recommended to follow-up with podiatrist at [facility] they recommended amputation apparently and she is adamant she does not want to amputation.  Currently denies any fevers, chills, nausea, vomiting.  No calf pain, chest pain, shortness of breath.  Objective: AAO x3, NAD The wound at the distal aspect of the toe is healed.  Black eschar present to the medial hallux and there is fibrotic tissue was found the purulence underneath the eschar.  There is no surrounding erythema, ascending cellulitis peer there is no fluctuation crepitation.  No other open lesions identified at this time. Previous secondary intention of the right foot No pain with calf compression, swelling, warmth, erythema   Assessment: 69 year old female with ulceration right foot; PAD  Plan: -All treatment options discussed with the patient including all alternatives, risks, complications. -Sharply debrided the wound because of the 15th with scalpel to remove the eschar as well as nonviable tissue down to bleeding tissue.  Now moderate continue with Betadine wet-to-dry dressing changes.  Prescribed doxycycline. -X-rays obtained reviewed.  Questional changes to the medial hallux IPJ but no definitive cortical breakdown.  *Repeat x-rays next appointment  Trula Slade DPM

## 2019-11-17 LAB — WOUND CULTURE
MICRO NUMBER:: 10942569
SPECIMEN QUALITY:: ADEQUATE

## 2019-11-19 ENCOUNTER — Encounter: Payer: Self-pay | Admitting: Podiatry

## 2019-11-20 ENCOUNTER — Other Ambulatory Visit: Payer: Self-pay | Admitting: Medical

## 2019-11-20 ENCOUNTER — Other Ambulatory Visit: Payer: Self-pay | Admitting: Podiatry

## 2019-11-20 ENCOUNTER — Ambulatory Visit: Payer: HMO | Admitting: Podiatry

## 2019-11-20 MED ORDER — CEPHALEXIN 500 MG PO CAPS: 500.0000 mg | ORAL_CAPSULE | Freq: Three times a day (TID) | ORAL | 0 refills | Status: DC

## 2019-11-20 MED ORDER — LEVOFLOXACIN 500 MG PO TABS: 500.0000 mg | ORAL_TABLET | Freq: Every day | ORAL | 0 refills | Status: DC

## 2019-11-21 ENCOUNTER — Other Ambulatory Visit: Payer: Self-pay

## 2019-11-21 NOTE — Patient Outreach (Addendum)
  [place] [facility]) Care Management Chronic Special Needs Program   11/21/2019  Name: Klaryssa, Fauth: 1951/01/16  MRN: [id number]  The client was discussed in today's interdisciplinary care team meeting. Client is actively involved with [facility].   The following issues were discussed:  Client's needs, Changes in health status, Key risk triggers/risk stratification, Care Plan, Coordination of care and Issues/barriers to care  Interdisciplinary team: Bary Castilla, BSN, MS, CCM Dr. Hollice Espy, BSN, CCM Peter Garter, BSN, CCM, CDE Jacqlyn Larsen RN, BSN Thea Silversmith, BSN, MSN, CCM [facility] / CMAA Ysidro Evert, RN - [facility] Roney Mans, CDE, Health Coach HTA Janey Genta, RN, BSN La Leanord Hawking, RN, BSN [place]ano Heir, Mudlogger of Quality, HTA Linus Orn, Government social research officer, CSNP/ HTA Daisy Blossom, Publishing copy HTA   Plan:  RNCM will continue care coordination and collaboration with [facility] team.  RNCM will continue to follow as clients CSNP care management coordinator  Quinn Plowman RN,BSN,CCM [facility] [phone]

## 2019-11-23 DIAGNOSIS — T8189XA Other complications of procedures, not elsewhere classified, initial encounter: Secondary | ICD-10-CM | POA: Diagnosis not present

## 2019-11-25 ENCOUNTER — Encounter: Payer: Self-pay | Admitting: Medical

## 2019-11-26 DIAGNOSIS — T8189XA Other complications of procedures, not elsewhere classified, initial encounter: Secondary | ICD-10-CM | POA: Diagnosis not present

## 2019-11-27 ENCOUNTER — Telehealth: Payer: Self-pay | Admitting: Medical

## 2019-11-27 MED ORDER — OXYCODONE HCL 5 MG PO TABS
ORAL_TABLET | ORAL | 0 refills | Status: DC
Start: 2019-11-27 — End: 2019-12-27

## 2019-11-27 NOTE — Telephone Encounter (Addendum)
Requesting: oxycodone Contract:08/17/19 UDS:07/07/19 Last Visit:09/18/19  Next Visit:n/a Last Refill:10/02/19  Please Advise  I refilled oxycodone 30 tabs per contract.

## 2019-11-27 NOTE — Telephone Encounter (Signed)
Medication: oxyCODONE (OXY IR/ROXICODONE) 5 MG immediate release tablet [[id number]      Has the patient contacted their pharmacy? (If no, request that the patient contact the pharmacy for the refill.) (If yes, when and what did the pharmacy advise?)     Preferred Pharmacy (with phone number or street name): [place], [place] - [address]  [address], [place] [address]  Phone:  [phone] Fax:  [phone]      Agent: Please be advised that RX refills may take up to 3 business days. We ask that you follow-up with your pharmacy.

## 2019-11-29 ENCOUNTER — Telehealth: Payer: Self-pay | Admitting: Pharmacist

## 2019-11-29 ENCOUNTER — Other Ambulatory Visit: Payer: Self-pay

## 2019-11-29 ENCOUNTER — Ambulatory Visit (INDEPENDENT_AMBULATORY_CARE_PROVIDER_SITE_OTHER): Payer: HMO | Admitting: Podiatry

## 2019-11-29 DIAGNOSIS — L97511 Non-pressure chronic ulcer of other part of right foot limited to breakdown of skin: Secondary | ICD-10-CM

## 2019-11-29 DIAGNOSIS — I739 Peripheral vascular disease, unspecified: Secondary | ICD-10-CM

## 2019-11-29 DIAGNOSIS — L97509 Non-pressure chronic ulcer of other part of unspecified foot with unspecified severity: Secondary | ICD-10-CM

## 2019-11-29 DIAGNOSIS — E11621 Type 2 diabetes mellitus with foot ulcer: Secondary | ICD-10-CM

## 2019-11-29 DIAGNOSIS — Z794 Long term (current) use of insulin: Secondary | ICD-10-CM | POA: Diagnosis not present

## 2019-11-29 NOTE — Progress Notes (Addendum)
Verified Adherence Gap Information. Per insurance data, the patient is 100% compliant with her Statin and Ace/Arb medications. Atorvastatin 10 mg tab and Losartan 25 mg tab.  Fanny Skates, [place] Pharmacist Assistant [phone]  Reviewed by: De Blanch, PharmD Clinical Pharmacist [facility] at [facility] [phone]    I have collaborated with the care management provider regarding care management and care coordination activities outlined in this encounter and have reviewed this encounter including documentation in the note and care plan. I am certifying that I agree with the content of this note and encounter as supervising provider.  Mackie Pai, PA-C

## 2019-11-29 NOTE — Progress Notes (Signed)
Subjective: Alexis [name] presents the office today for follow up evaluation of an ulceration to the medial aspect the right big toe.  She states that nursing been changing the bandage daily and applying Betadine to the area.  She feels the wound is healing and doing better.  Denies any drainage or pus or any swelling or redness.  She has a wound VAC to the groin currently from the previous vascular surgery but otherwise has been doing well to the foot.  She has a painful callus on the side of the foot that she keeps an offloading pad which helps. Currently denies any fevers, chills, nausea, vomiting.  No calf pain, chest pain, shortness of breath.  No complications antibiotics.  Objective: AAO x3, NAD The wound at the distal aspect of the toe is healed.  Black eschar present to the medial hallux with central granulation tissue.  Upon debridement the wound is smaller and the granulation tissue is evident with out any probing, undermining or tunneling.  No edema, erythema or signs of infection.  No fluctuation crepitation. Reveals a callus to the medial heel without any ulceration drainage or signs of infection. No pain with calf compression, swelling, warmth, erythema   Assessment: 69 year old female with ulceration right foot; PAD  Plan: -All treatment options discussed with the patient including all alternatives, risks, complications. -Please debrided sharply debrided the wound today utilizing the three twelve with scalpel to any complications down to healthy, bleeding, viable tissue.  We will switch to using medihoney dressing.  -Continue offloading to the heel. -X-rays obtained reviewed.  Questional changes to the medial hallux IPJ but no definitive cortical breakdown.  *Repeat x-rays next appointment  Trula Slade DPM

## 2019-11-30 ENCOUNTER — Telehealth: Payer: Self-pay

## 2019-11-30 DIAGNOSIS — Z794 Long term (current) use of insulin: Secondary | ICD-10-CM | POA: Diagnosis not present

## 2019-11-30 DIAGNOSIS — R2681 Unsteadiness on feet: Secondary | ICD-10-CM | POA: Diagnosis not present

## 2019-11-30 DIAGNOSIS — E11621 Type 2 diabetes mellitus with foot ulcer: Secondary | ICD-10-CM | POA: Diagnosis not present

## 2019-11-30 DIAGNOSIS — I509 Heart failure, unspecified: Secondary | ICD-10-CM | POA: Diagnosis not present

## 2019-11-30 DIAGNOSIS — E1142 Type 2 diabetes mellitus with diabetic polyneuropathy: Secondary | ICD-10-CM | POA: Diagnosis not present

## 2019-11-30 DIAGNOSIS — Z4801 Encounter for change or removal of surgical wound dressing: Secondary | ICD-10-CM | POA: Diagnosis not present

## 2019-11-30 DIAGNOSIS — Z48812 Encounter for surgical aftercare following surgery on the circulatory system: Secondary | ICD-10-CM | POA: Diagnosis not present

## 2019-11-30 DIAGNOSIS — I110 Hypertensive heart disease with heart failure: Secondary | ICD-10-CM | POA: Diagnosis not present

## 2019-11-30 DIAGNOSIS — I4891 Unspecified atrial fibrillation: Secondary | ICD-10-CM | POA: Diagnosis not present

## 2019-11-30 DIAGNOSIS — L97513 Non-pressure chronic ulcer of other part of right foot with necrosis of muscle: Secondary | ICD-10-CM | POA: Diagnosis not present

## 2019-11-30 NOTE — Telephone Encounter (Signed)
[facility] called today and wanted can updated on wound care orders since pt told them that her wound care orders has changed. Please advise

## 2019-12-03 ENCOUNTER — Telehealth: Payer: Self-pay | Admitting: Gastroenterology

## 2019-12-03 NOTE — Telephone Encounter (Signed)
Spoke to patient and her home health nurse this morning who reports patient took an extra Lasix pill this weekend due to shortness of breath and leg swelling. Patient reports taking one extra Lasix 20 mg tab. She was advised to contact her cardiologist who handles this medication,and report her symptoms ASAP. Patient and the home health nurse voiced understanding.

## 2019-12-03 NOTE — Telephone Encounter (Signed)
Debbie from [facility] called states the patient had to take an extra Lasix this weekend and is having weight loss. Please call her back

## 2019-12-03 NOTE — Telephone Encounter (Signed)
Keely called and spoke with [facility]

## 2019-12-03 NOTE — Telephone Encounter (Signed)
Left VM to call back 

## 2019-12-05 NOTE — Telephone Encounter (Signed)
Left message for patient to return call.

## 2019-12-05 NOTE — Telephone Encounter (Signed)
Called patient. Informed her of Dr. Julien Nordmann recommendations. She reports she cannot get to [facility] she lives in [place] and that is too far. However, she has lost 4lbs since yesterday and her shortness of breath is better. She took a extra 20 mg of lasix on her own and it seemed to help. She will let us know if things get worse again. She has been in contact also with her surgeon regarding wound. No further questions at this time.

## 2019-12-05 NOTE — Telephone Encounter (Signed)
Patient advised if things get worse to go to the emergency department.

## 2019-12-06 ENCOUNTER — Telehealth: Payer: Self-pay | Admitting: Pharmacist

## 2019-12-06 NOTE — Progress Notes (Addendum)
Chronic Care Management Pharmacy Assistant   Name: Alexis [name]  MRN: [id number] DOB: Nov 05, 1950  Reason for Encounter: Disease State  Patient Questions:  1.  Have you seen any other providers since your last visit? Yes  2.  Any changes in your medicines or health? No  PCP : Mackie Pai, PA-C   Their chronic conditions include: DM, CHF, PVD, CHF, AFib, HTN, Hypothyroid, Osteoarthritis, Gout, HLD.  Office Visit: None since their last CCM phone call on 10-24-2019  Consults:  11-29-2019 [facility]) Patient presented in the office for f/u of an ulceration to the medial aspect the right big toe. Patient reports nursing has been changing the bandage daily and applying Betadine to the area. She states she feels the wound id healing and doing better.  11-28-2019 (Vascular Surgery) Patient presents to the clinic today for a wound check of her right groin wound following wound VAC placement. She is 5 weeks status post Pham distal bypass with saphenous vein graft by Dr. Jimmye Norman for nonhealing right foot wound and rest pain. She reported using quite a bit of narcotic pain medication with wound VAC changes requiring multiple refills from our clinic, she has since discussed her pain issues with her PCP and has plans on following up with them for further pain medications in the future.  11-14-2019 (Vascular Surgery) Patient presented in the office for f/u and wound check. Patient reported to have increased pain as she has large amounts of drainage from this groin wound. She states that the surrounding skin is burning and causing great discomfort. Notes indicate her right lower extremity following by[ass are healing well without issue. Wound VAC was placed at the time of the visit without difficulty, however, the canister and VAC machine were malfunctioning. A new machine was to be delivered to the patient's house that day  11-12-2019 ([place]) Patient presented in the [facility] for follow up  evaluation of an ulceration to the medial aspect the right big toe. Patient was concerned that the healing of the incision.  Otherwise the toe she states this area turned hard and she has been applying Vaseline to the wound and since then the wound is becoming moist on the toe. After discharge the hospital recommended amputation, but she is adamant she does not want to amputate.  11-07-2019 ([facility]) Patient presents inthe office approximately 3 weeks status post right lower extremity fem to PT bypass with saphenous vein by Dr. Jimmye Norman.c/o over drainage and appearance of her right groin wound. She states that she continues to have pain but this is mostly from her neuropathy. She is taking Roxicodone which has been helping with her discomfort. Patient was ordered wound VAC via home health RN with black sponge to be changed 3 times weekly on a Monday Wednesday Friday.  11-07-2019: (Cardio) Patient had a CLIN VAS LAB-BYPASS GRAFT DUPLEX outpatient procedure with a diagnosis of PAD at [facility]  Allergies:   Allergies  Allergen Reactions   Indomethacin Other (See Comments)    Renal Insufficiency   Pregabalin Other (See Comments)    DIZZINESS    Sulfa Antibiotics Hives   Clindamycin/Lincomycin    Morphine And Related Nausea And Vomiting    Medications: Outpatient Encounter Medications as of 12/06/2019  Medication Sig Note   acetaminophen (TYLENOL) 500 MG tablet Take by mouth.    albuterol (VENTOLIN HFA) 108 (90 Base) MCG/ACT inhaler Inhale 2 puffs into the lungs every 6 (six) hours as needed for wheezing or  shortness of breath.    allopurinol (ZYLOPRIM) 100 MG tablet TAKE ONE TABLET BY MOUTH TWICE DAILY    AMBULATORY NON FORMULARY MEDICATION Motorized scooter.  Diagnosis: Osteoarthritis M19.90 06/05/2019: Reports her scooter and has some mechanical issues and a technician is scheduled to come and look at it this week.   atorvastatin (LIPITOR) 10 MG tablet Take 1 tablet  (10 mg total) by mouth daily.    cephALEXin (KEFLEX) 500 MG capsule Take 1 capsule (500 mg total) by mouth 3 (three) times daily. (Patient not taking: Reported on 10/26/2019)    cephALEXin (KEFLEX) 500 MG capsule Take 1 capsule (500 mg total) by mouth 3 (three) times daily.    clopidogrel (PLAVIX) 75 MG tablet Take 1 tablet (75 mg total) by mouth daily.    Continuous Blood Gluc Receiver (FREESTYLE LIBRE 14 DAY READER) DEVI Use as directed to monitor BG.  Dx E11.65    Continuous Blood Gluc Sensor (FREESTYLE LIBRE 14 DAY SENSOR) MISC INJECT INTO THE SKIN EVERY 14 DAYS    doxycycline (VIBRA-TABS) 100 MG tablet Take 1 tablet (100 mg total) by mouth 2 (two) times daily.    fluticasone (FLONASE) 50 MCG/ACT nasal spray Place 2 sprays into both nostrils at bedtime. (Patient taking differently: Place 2 sprays into both nostrils in the morning and at bedtime. )    furosemide (LASIX) 20 MG tablet TAKE 2 TABLETS BY MOUTH IN THE MORNING AND 1 TABLET AT NOON    gabapentin (NEURONTIN) 300 MG capsule Take 300 mg by mouth 3 (three) times daily.    Insulin Aspart FlexPen 100 UNIT/ML SOPN Inject 0-10 Units into the skin daily as needed (If glucose if over 200). Sliding scale    insulin degludec (TRESIBA) 100 UNIT/ML FlexTouch Pen Inject 14 Units into the skin daily.  11/06/2019: Client state she takes 20 units daily   Insulin Pen Needle (PEN NEEDLES 31GX5/16") 31G X 8 MM MISC Use as needed 4 times/day.  Dx E11.65    levocetirizine (XYZAL) 5 MG tablet TAKE 1 TABLET BY MOUTH EVERY EVENING    levofloxacin (LEVAQUIN) 500 MG tablet Take 1 tablet (500 mg total) by mouth daily.    levothyroxine (SYNTHROID) 150 MCG tablet Take 150 mcg by mouth daily before breakfast.     lidocaine (XYLOCAINE) 4 % external solution Apply topically.    losartan (COZAAR) 25 MG tablet TAKE ONE (1) TABLET BY MOUTH EVERY DAY (Patient taking differently: Take 25 mg by mouth daily. )    Melatonin 10 MG TABS Take 20 mg by mouth at bedtime. (Patient not  taking: Reported on 11/06/2019)    methocarbamol (ROBAXIN) 500 MG tablet Take 500 mg by mouth 2 (two) times daily.    metoprolol succinate (TOPROL-XL) 25 MG 24 hr tablet TAKE ONE (1) TABLET BY MOUTH EVERY DAY    ondansetron (ZOFRAN-ODT) 4 MG disintegrating tablet Take 4 mg by mouth every 8 (eight) hours as needed for nausea or vomiting.     ONETOUCH VERIO test strip 3 (three) times daily.    oxyCODONE (OXY IR/ROXICODONE) 5 MG immediate release tablet TAKE ONE (1) TABLET BY MOUTH EVERY DAY AS NEEDED FOR SEVERE PAIN    Prenatal Vit-Fe Fumarate-FA (MULTIVITAMIN-PRENATAL) 27-0.8 MG TABS tablet Take 1 tablet by mouth daily.     Probiotic CAPS Take 2 capsules by mouth daily. 50 billion (gummy)    RABEprazole (ACIPHEX) 20 MG tablet Take 1 tablet (20 mg total) by mouth daily.    sertraline (ZOLOFT) 50 MG tablet Take 1  tablet (50 mg total) by mouth daily. (Patient not taking: Reported on 11/06/2019)    spironolactone (ALDACTONE) 100 MG tablet Take 1 tablet (100 mg total) by mouth daily.    triamcinolone ointment (KENALOG) 0.1 % Apply to    No facility-administered encounter medications on file as of 12/06/2019.    Current Diagnosis: Patient Active Problem List   Diagnosis Date Noted   Depression 05/26/2019   Cellulitis 05/26/2019   Other cirrhosis of liver ([place]) 47/65/4650   Chronic systolic CHF (congestive heart failure) ([place]) 05/26/2019   Cellulitis in diabetic foot ([place]) 05/25/2019   Epigastric pain    Idiopathic esophageal varices without bleeding ([place])    Anemia due to chronic blood loss    Paroxysmal atrial fibrillation ([place]) 09/26/2018   Macrocytic anemia 09/04/2018   Pacemaker 08/25/2018   Insulin dependent diabetes mellitus with complications 35/46/5681   Constipation 08/25/2018   Fluid overload 08/25/2018   Chronic anticoagulation 08/25/2018   Morbid obesity ([place]) 08/25/2018   Chronic diastolic heart failure ([place]) 05/17/2018   Acute bronchitis with COPD ([place]) 01/16/2018   Acquired  absence of other right toe(s) ([place]) 11/16/2017   Type 2 diabetes mellitus with foot ulcer ([place]) 11/16/2017   Crush injury of left foot, initial encounter 11/28/2016   Osteomyelitis ([place]) 07/30/2016   Gangrene of foot ([place]) 04/27/2016   Thrombocytopenia ([place])    Vomiting and diarrhea 04/05/2016   Hyperlipidemia 11/18/2015   Gout 08/05/2014   Cough 08/05/2014   Allergic rhinitis 07/12/2014   Osteoarthritis 07/12/2014   Asthma 07/12/2014   Congestive heart failure ([place]) 07/12/2014   Permanent atrial fibrillation ([place]) 07/12/2014   HTN (hypertension) 07/12/2014   Hypothyroidism 07/12/2014   History of substance abuse ([place]) 07/12/2014   Peripheral vascular disease ([place]) 04/02/2013   Discoloration of skin 04/02/2013    Goals Addressed   None    Recent Relevant Labs: Lab Results  Component Value Date/Time   HGBA1C 7.7 (H) 05/26/2019 04:49 PM   HGBA1C 9.0 04/03/2015 12:00 AM   MICROALBUR 0.3 04/10/2015 10:40 AM    Kidney Function Lab Results  Component Value Date/Time   CREATININE 0.82 09/18/2019 11:23 AM   CREATININE 1.20 (H) 08/27/2019 10:09 AM   CREATININE 0.76 10/10/2018 02:03 PM   CREATININE 1.03 (H) 09/08/2018 11:37 AM   GFR 57.41 (L) 05/23/2019 01:38 PM   GFRNONAA 73 09/18/2019 11:23 AM   GFRNONAA >60 10/10/2018 02:03 PM   GFRAA 84 09/18/2019 11:23 AM   GFRAA >60 10/10/2018 02:03 PM    Current antihyperglycemic regimen:  insulin degludec (TRESIBA) 100 UNIT/ML 14 units daily Insulin aspart sliding scale when blood sugar >200  What recent interventions/DTPs have been made to improve glycemic control:  None at this time  Have there been any recent hospitalizations or ED visits since last visit with CPP? Yes   Patient reports hypoglycemic symptoms, including Nervous/irritable   Patient denies hyperglycemic symptoms, including none   How often are you checking your blood sugar? 3-4 times daily   What are your blood sugars ranging?  Fasting: 161 Before meals:  134, 156 After meals: 136, 129, 176, 150 Bedtime: 170, 147, 235  During the week, how often does your blood glucose drop below 70? Never   Are you checking your feet daily/regularly? Patient has ongoing foot issues and under the treatment of a Podiatrist. She says her toe is healing well.  Adherence Review: Is the patient currently on a STATIN medication? Yes atorvastatin 10 MG tablet Is the patient currently on ACE/ARB  medication? Yes losartan 25 MG tablet Does the patient have >5 day gap between last estimated fill dates? No    Follow-Up:  Pharmacist Review and Scheduled Follow-Up With Clinical Pharmacist on Tuesday November 2nd at 1:00 pm over the telephone.   Fanny Skates, [place] Pharmacist Assistant [phone]  Reviewed by: De Blanch, PharmD Clinical Pharmacist [facility] at [facility] [phone]     I have collaborated with the care management provider regarding care management and care coordination activities outlined in this encounter and have reviewed this encounter including documentation in the note and care plan. I am certifying that I agree with the content of this note and encounter as supervising provider.  Mackie Pai, PA-C

## 2019-12-09 ENCOUNTER — Encounter: Payer: Self-pay | Admitting: Medical

## 2019-12-10 ENCOUNTER — Telehealth: Payer: Self-pay | Admitting: Cardiology

## 2019-12-10 NOTE — Telephone Encounter (Signed)
Called patient. She reports that she has gained 8 lbs since Friday. She has had increased shortness of breath for over a week and swelling in her abdomen. Last week she reached out and we were going to set her up for outpatient lasix at [facility] but she reports that was too far and things were getting better. We advised her to go to the emergency department if things got worse. She reports she has gained 8 lbs since Friday. She is taking lasix 20 mg two tablets in the morning and 1 tablet at noon. I advised her to go to there emergency department. She [place] to [place] [facility] [place]. Will check with dod to see if there are any further recommendations.

## 2019-12-10 NOTE — Telephone Encounter (Signed)
No I think I gave you good advice.  Who did Dr. Agustin Cree signout to this week?

## 2019-12-10 NOTE — Telephone Encounter (Signed)
CallerShanai Lartigue Call Back # [phone]   Patient called back in regards to refill request for muscle relaxer medication... She would like someone to reach back out to her from the clinical team, please advise

## 2019-12-10 NOTE — Telephone Encounter (Signed)
Pt c/o swelling: STAT is pt has developed SOB within 24 hours  1) How much weight have you gained and in what time span? 8 lbs since Friday  2) If swelling, where is the swelling located? no  3) Are you currently taking a fluid pill? Yes, has taken an extra lasix for 5 days  4) Are you currently SOB? no  5) Do you have a log of your daily weights (if so, list)? 10/4 204 lbs, 10/5 199 lbs, 10/6 200 lbs, 10/8 200 lbs, today 208 lbs  6) Have you gained 3 pounds in a day or 5 pounds in a week? 8 lbs since friday  7) Have you traveled recently? No   Debbie RN from [facility] states the patient has gained 8 lbs since Friday. She states she has taken an extra lasix for 5 days and it has not helped. She states she gets SOB, but is not currently. She states it comes and goes when she is sitting. She states she is not having any other symptoms and is not having any swelling.

## 2019-12-10 NOTE — Telephone Encounter (Signed)
Ok to refill 

## 2019-12-11 ENCOUNTER — Telehealth: Payer: Self-pay | Admitting: Medical

## 2019-12-11 MED ORDER — METHOCARBAMOL 500 MG PO TABS: ORAL_TABLET | ORAL | 0 refills | Status: DC

## 2019-12-11 NOTE — Telephone Encounter (Signed)
Opened to review and rx robaxin.

## 2019-12-11 NOTE — Telephone Encounter (Signed)
Rx robaxin sent to pt pharmacy.

## 2019-12-13 DIAGNOSIS — T8189XA Other complications of procedures, not elsewhere classified, initial encounter: Secondary | ICD-10-CM | POA: Diagnosis not present

## 2019-12-17 ENCOUNTER — Encounter: Payer: Self-pay | Admitting: Podiatry

## 2019-12-17 DIAGNOSIS — T8189XA Other complications of procedures, not elsewhere classified, initial encounter: Secondary | ICD-10-CM | POA: Diagnosis not present

## 2019-12-18 ENCOUNTER — Encounter: Payer: Self-pay | Admitting: Medical

## 2019-12-18 ENCOUNTER — Telehealth: Payer: Self-pay | Admitting: Medical

## 2019-12-18 NOTE — Telephone Encounter (Signed)
Opened to review 

## 2019-12-20 ENCOUNTER — Encounter: Payer: Self-pay | Admitting: Medical

## 2019-12-22 ENCOUNTER — Other Ambulatory Visit: Payer: Self-pay | Admitting: Medical

## 2019-12-22 ENCOUNTER — Telehealth: Payer: Self-pay | Admitting: Medical

## 2019-12-22 DIAGNOSIS — R109 Unspecified abdominal pain: Secondary | ICD-10-CM

## 2019-12-22 DIAGNOSIS — I8500 Esophageal varices without bleeding: Secondary | ICD-10-CM

## 2019-12-22 DIAGNOSIS — K7469 Other cirrhosis of liver: Secondary | ICD-10-CM

## 2019-12-22 DIAGNOSIS — R110 Nausea: Secondary | ICD-10-CM

## 2019-12-22 NOTE — Telephone Encounter (Signed)
Referral to new gi placed.

## 2019-12-24 ENCOUNTER — Encounter: Payer: Self-pay | Admitting: Cardiology

## 2019-12-24 ENCOUNTER — Ambulatory Visit (INDEPENDENT_AMBULATORY_CARE_PROVIDER_SITE_OTHER): Payer: HMO | Admitting: Cardiology

## 2019-12-24 ENCOUNTER — Other Ambulatory Visit: Payer: Self-pay

## 2019-12-24 VITALS — BP 128/60 | HR 78 | Ht 62.0 in | Wt 193.0 lb

## 2019-12-24 DIAGNOSIS — I4821 Permanent atrial fibrillation: Secondary | ICD-10-CM

## 2019-12-24 NOTE — Progress Notes (Signed)
Electrophysiology Office Note   Date:  12/24/2019   ID:  Alexis [name], DOB June 15, 1950, MRN [id number]  PCP:  Elise Benne  Primary Electrophysiologist:  Zurich Carreno Meredith Leeds, MD    No chief complaint on file.    History of Present Illness: Alexis [name] is a 69 y.o. female who is being seen today for the evaluation of pacemaker at the request of [place], Percell Miller, [place]. Presenting today for electrophysiology evaluation.   She has a history of atrial fibrillation, COPD, insulin-dependent diabetes, hypertension, hyperlipidemia, Graves' disease status post radioactive iodine, and a Medtronic pacemaker.  She also has cirrhosis.  Her device was implanted in 2016.  She has an RV lead, His bundle lead, and a right atrial lead.  She has longstanding persistent atrial fibrillation and thus she is set to VVIR.  Today, denies symptoms of palpitations, chest pain, shortness of breath, orthopnea, PND, lower extremity edema, claudication, dizziness, presyncope, syncope, bleeding, or neurologic sequela. The patient is tolerating medications without difficulties.  Since last being seen, she has had a quite eventful vascular surgery history.  She has had balloons and stents placed, followed by a bypass at [facility].  She said that she has a wound VAC that was taken off on Wednesday.  Her leg feels much better.  She does have a wound on her toe that is healing.   Past Medical History:  Diagnosis Date   Allergy    Anxiety    Arthritis    Asthma    Atrial fibrillation (HCC)    CHF (congestive heart failure) ([place])    Cirrhosis ([place])    COPD (chronic obstructive pulmonary disease) ([place])    Depression 05/26/2019   Diabetes mellitus without complication (HCC)    Hyperlipidemia    Hypertension    Macrocytic anemia 09/04/2018   Myocardial infarction [facility])    several   Pacemaker    CRT-P with RV lead and His Bundle lead ( high threshold)    Peripheral neuropathy     Pneumonia    PONV (postoperative nausea and vomiting)    unsure of exactly what happened at [facility] in 2010 (knee surgery) that led to crital care   Thyroid disease    Past Surgical History:  Procedure Laterality Date   ABDOMINAL AORTOGRAM W/LOWER EXTREMITY N/A 04/29/2016   Procedure: Abdominal Aortogram w/Lower Extremity;  Surgeon: Waynetta Sandy, MD;  Location: [place] [facility];  Service: Cardiovascular;  Laterality: N/A;   ABDOMINAL AORTOGRAM W/LOWER EXTREMITY N/A 05/06/2017   Procedure: ABDOMINAL AORTOGRAM W/LOWER EXTREMITY;  Surgeon: Angelia Mould, MD;  Location: [place] [facility];  Service: Cardiovascular;  Laterality: N/A;  bilateral   ABDOMINAL AORTOGRAM W/LOWER EXTREMITY Bilateral 05/04/2019   Procedure: ABDOMINAL AORTOGRAM W/LOWER EXTREMITY;  Surgeon: Angelia Mould, MD;  Location: [place] [facility];  Service: Cardiovascular;  Laterality: Bilateral;   ABDOMINAL AORTOGRAM W/LOWER EXTREMITY Left 08/27/2019   Procedure: ABDOMINAL AORTOGRAM W/LOWER EXTREMITY;  Surgeon: Waynetta Sandy, MD;  Location: [place] [facility];  Service: Cardiovascular;  Laterality: Left;   ABDOMINAL HYSTERECTOMY     AMPUTATION Right 07/30/2016   Procedure: AMPUTATION RIGHT SECOND TOE;  Surgeon: Angelia Mould, MD;  Location: [place];  Service: Vascular;  Laterality: Right;   CARDIAC CATHETERIZATION     2005 at [place] N/A 12/25/2018   Procedure: COLONOSCOPY WITH PROPOFOL;  Surgeon: Jackquline Denmark, MD;  Location: [facility];  Service: Endoscopy;  Laterality:  N/A;   ESOPHAGEAL BANDING  12/25/2018   Procedure: ESOPHAGEAL BANDING;  Surgeon: Jackquline Denmark, MD;  Location: [facility];  Service: Endoscopy;;   ESOPHAGEAL BANDING  05/01/2019   Procedure: ESOPHAGEAL BANDING;  Surgeon: Jackquline Denmark, MD;  [facility]: [facility];  Service: Endoscopy;;   ESOPHAGOGASTRODUODENOSCOPY (EGD) WITH PROPOFOL N/A 12/25/2018    Procedure: ESOPHAGOGASTRODUODENOSCOPY (EGD) WITH PROPOFOL;  Surgeon: Jackquline Denmark, MD;  [facility];  Service: Endoscopy;  Laterality: N/A;   ESOPHAGOGASTRODUODENOSCOPY (EGD) WITH PROPOFOL N/A 05/01/2019   Procedure: ESOPHAGOGASTRODUODENOSCOPY (EGD) WITH PROPOFOL;  Surgeon: Jackquline Denmark, MD;  [facility];  Service: Endoscopy;  Laterality: N/A;   LOWER EXTREMITY ANGIOGRAPHY Right 05/14/2019   Procedure: LOWER EXTREMITY ANGIOGRAPHY;  Surgeon: Waynetta Sandy, MD;  Location: [place] [facility];  Service: Cardiovascular;  Laterality: Right;   PERIPHERAL VASCULAR ATHERECTOMY Right 04/29/2016   Procedure: Peripheral Vascular Atherectomy-Right Popliteal;  Surgeon: Waynetta Sandy, MD;  Location: [place] [facility];  Service: Cardiovascular;  Laterality: Right;   PERIPHERAL VASCULAR ATHERECTOMY Right 05/14/2019   Procedure: PERIPHERAL VASCULAR ATHERECTOMY;  Surgeon: Waynetta Sandy, MD;  Location: [place] [facility];  Service: Cardiovascular;  Laterality: Right;  right SFA/pop   PERIPHERAL VASCULAR BALLOON ANGIOPLASTY Left 08/27/2019   Procedure: PERIPHERAL VASCULAR BALLOON ANGIOPLASTY;  Surgeon: Waynetta Sandy, MD;  Location: [place] [facility];  Service: Cardiovascular;  Laterality: Left;  popliteal   PERIPHERAL VASCULAR INTERVENTION Right 04/29/2016   Procedure: Peripheral Vascular Intervention-Right Popliteal;  Surgeon: Waynetta Sandy, MD;  Location: [place] [facility];  Service: Cardiovascular;  Laterality: Right;  POPLITEAL PTA   PERIPHERAL VASCULAR INTERVENTION Right 05/14/2019   Procedure: PERIPHERAL VASCULAR INTERVENTION;  Surgeon: Waynetta Sandy, MD;  Location: [place] [facility];  Service: Cardiovascular;  Laterality: Right;  popliteal stent   RADIOFREQUENCY ABLATION     TOTAL KNEE ARTHROPLASTY     TUBAL LIGATION       Current Outpatient Medications  Medication Sig Dispense Refill   acetaminophen (TYLENOL)  500 MG tablet Take by mouth.     albuterol (VENTOLIN HFA) 108 (90 Base) MCG/ACT inhaler Inhale 2 puffs into the lungs every 6 (six) hours as needed for wheezing or shortness of breath.     allopurinol (ZYLOPRIM) 100 MG tablet TAKE ONE TABLET BY MOUTH TWICE DAILY 60 tablet 3   AMBULATORY NON FORMULARY MEDICATION Motorized scooter.  Diagnosis: Osteoarthritis M19.90 1 each 0   atorvastatin (LIPITOR) 10 MG tablet Take 1 tablet (10 mg total) by mouth daily. 30 tablet 11   cephALEXin (KEFLEX) 500 MG capsule Take 1 capsule (500 mg total) by mouth 3 (three) times daily. 30 capsule 0   cephALEXin (KEFLEX) 500 MG capsule Take 1 capsule (500 mg total) by mouth 3 (three) times daily. 30 capsule 0   clopidogrel (PLAVIX) 75 MG tablet Take 1 tablet (75 mg total) by mouth daily. 30 tablet 11   Continuous Blood Gluc Receiver (FREESTYLE LIBRE 14 DAY READER) DEVI Use as directed to monitor BG.  Dx E11.65     Continuous Blood Gluc Sensor (FREESTYLE LIBRE 14 DAY SENSOR) MISC INJECT INTO THE SKIN EVERY 14 DAYS     doxycycline (VIBRA-TABS) 100 MG tablet Take 1 tablet (100 mg total) by mouth 2 (two) times daily. 20 tablet 0   fluticasone (FLONASE) 50 MCG/ACT nasal spray Place 2 sprays into both nostrils at bedtime. (Patient taking differently: Place 2 sprays into both nostrils in the morning and at bedtime. ) 16 g 3  furosemide (LASIX) 20 MG tablet TAKE 2 TABLETS BY MOUTH IN THE MORNING AND 1 TABLET AT NOON 270 tablet 2   gabapentin (NEURONTIN) 300 MG capsule Take 300 mg by mouth 3 (three) times daily.     Insulin Aspart FlexPen 100 UNIT/ML SOPN Inject 0-10 Units into the skin daily as needed (If glucose if over 200). Sliding scale     insulin degludec (TRESIBA) 100 UNIT/ML FlexTouch Pen Inject 14 Units into the skin daily.      Insulin Pen Needle (PEN NEEDLES 31GX5/16") 31G X 8 MM MISC Use as needed 4 times/day.  Dx E11.65     levocetirizine (XYZAL) 5 MG tablet TAKE 1 TABLET BY MOUTH EVERY EVENING 30  tablet 2   levofloxacin (LEVAQUIN) 500 MG tablet Take 1 tablet (500 mg total) by mouth daily. 7 tablet 0   levothyroxine (SYNTHROID) 150 MCG tablet Take 150 mcg by mouth daily before breakfast.      lidocaine (XYLOCAINE) 4 % external solution Apply topically.     losartan (COZAAR) 25 MG tablet TAKE ONE (1) TABLET BY MOUTH EVERY DAY (Patient taking differently: Take 25 mg by mouth daily. ) 90 tablet 1   Melatonin 10 MG TABS Take 20 mg by mouth at bedtime.      methocarbamol (ROBAXIN) 500 MG tablet 1 tab po bid 30 tablet 0   metoprolol succinate (TOPROL-XL) 25 MG 24 hr tablet TAKE ONE (1) TABLET BY MOUTH EVERY DAY 90 tablet 1   ondansetron (ZOFRAN-ODT) 4 MG disintegrating tablet Take 4 mg by mouth every 8 (eight) hours as needed for nausea or vomiting.      ONETOUCH VERIO test strip 3 (three) times daily.     oxyCODONE (OXY IR/ROXICODONE) 5 MG immediate release tablet TAKE ONE (1) TABLET BY MOUTH EVERY DAY AS NEEDED FOR SEVERE PAIN 30 tablet 0   Prenatal Vit-Fe Fumarate-FA (MULTIVITAMIN-PRENATAL) 27-0.8 MG TABS tablet Take 1 tablet by mouth daily.      Probiotic CAPS Take 2 capsules by mouth daily. 50 billion (gummy)     RABEprazole (ACIPHEX) 20 MG tablet Take 1 tablet (20 mg total) by mouth daily. 30 tablet 1   sertraline (ZOLOFT) 50 MG tablet Take 1 tablet (50 mg total) by mouth daily. 30 tablet 3   spironolactone (ALDACTONE) 100 MG tablet Take 1 tablet (100 mg total) by mouth daily. 30 tablet 6   triamcinolone ointment (KENALOG) 0.1 % Apply to     No current facility-administered medications for this visit.    Allergies:   Indomethacin, Pregabalin, Sulfa antibiotics, Clindamycin/lincomycin, and Morphine and related   Social History:  The patient  reports that she quit smoking about 21 years ago. Her smoking use included cigarettes. She has never used smokeless tobacco. She reports previous drug use. Drug: Marijuana. She reports that she does not drink alcohol.   Family  History:  The patient's family history includes Diabetes in her brother, father, and mother; Heart attack in her brother and father; Heart disease in her father; Hyperlipidemia in her brother, father, and mother.   ROS:  Please see the history of present illness.   Otherwise, review of systems is positive for none.   All other systems are reviewed and negative.   PHYSICAL EXAM: VS:  BP 128/60    Pulse 78    Ht 5' 2"  (1.575 m)    Wt 193 lb (87.5 kg)    SpO2 98%    BMI 35.30 kg/m  , BMI Body mass index is  35.3 kg/m. GEN: Well nourished, well developed, in no acute distress  HEENT: normal  Neck: no JVD, carotid bruits, or masses Cardiac: RRR; no murmurs, rubs, or gallops,no edema  Respiratory:  clear to auscultation bilaterally, normal work of breathing GI: soft, nontender, nondistended, + BS MS: no deformity or atrophy  Skin: warm and dry, device site well healed Neuro:  Strength and sensation are intact Psych: euthymic mood, full affect  EKG:  EKG is ordered today. Personal review of the ekg ordered shows AF rate 78, inferolateral T wave inversions  Personal review of the device interrogation today. Results in [place]: 02/09/2019: Magnesium 1.7 05/23/2019: Pro B Natriuretic peptide (BNP) 73.0 05/27/2019: Platelets 62 08/27/2019: Hemoglobin 13.9 09/18/2019: ALT 27; BUN 17; Creatinine, Ser 0.82; Potassium 4.2; Sodium 138    Lipid Panel     Component Value Date/Time   CHOL 93 (L) 05/10/2018 0944   TRIG 147 05/10/2018 0944   HDL 31 (L) 05/10/2018 0944   CHOLHDL 3.0 05/10/2018 0944   CHOLHDL 6 04/10/2015 1040   VLDL 50.8 (H) 04/10/2015 1040   LDLCALC 33 05/10/2018 0944   LDLDIRECT 111.0 04/10/2015 1040     Wt Readings from Last 3 Encounters:  12/24/19 193 lb (87.5 kg)  10/05/19 188 lb (85.3 kg)  09/18/19 190 lb (86.2 kg)      Other studies Reviewed: Additional studies/ records that were reviewed today include: TTE 11/26/15  Review of the above records today  demonstrates:  - Left ventricle: The cavity size was normal. Wall thickness was   normal. Systolic function was normal. The estimated ejection   fraction was in the range of 50% to 55%. - Left atrium: The atrium was mildly dilated. - Right ventricle: The cavity size was mildly dilated. - Atrial septum: No defect or patent foramen ovale was identified. - Impressions: Overall poor image quality   ASSESSMENT AND PLAN:  1.  Tachybradycardia syndrome: Status post Medtronic pacemaker.  She has a His lead and RV lead, and RA lead.  She is set to VVIR pacing off of the RV lead.  Device functioning appropriately.  No changes.  2.  Longstanding persistent atrial fibrillation: Currently not anticoagulated due to cirrhosis and other comorbidities.  With a CHA2DS2-VASc of 4.  Rate is well controlled.  She is somewhat volume overloaded on her pacemaker today.  I told her to increase her Lasix to 40 mg twice daily for 1 week.   Current medicines are reviewed at length with the patient today.   The patient does not have concerns regarding her medicines.  The following changes were made today:   Labs/ tests ordered today include:  Orders Placed This Encounter  Procedures   EKG 12-Lead     Disposition:   FU with Orra Nolde 12 months  Signed, Rafal Archuleta Meredith Leeds, MD  12/24/2019 4:13 PM     [place] [address] [place] [place] [address] [phone] (office) [phone] (fax)

## 2019-12-24 NOTE — Patient Instructions (Signed)
Medication Instructions:  Your physician recommends that you continue on your current medications as directed. Please refer to the Current Medication list given to you today.  *If you need a refill on your cardiac medications before your next appointment, please call your pharmacy*   Lab Work: None ordered If you have labs (blood work) drawn today and your tests are completely normal, you will receive your results only by: . MyChart Message (if you have MyChart) OR . A paper copy in the mail If you have any lab test that is abnormal or we need to change your treatment, we will call you to review the results.   Testing/Procedures: None ordered   Follow-Up: At [facility], you and your health needs are our priority.  As part of our continuing mission to provide you with exceptional heart care, we have created designated Provider Care Teams.  These Care Teams include your primary Cardiologist (physician) and Advanced Practice Providers (APPs -  Physician Assistants and Nurse Practitioners) who all work together to provide you with the care you need, when you need it.  We recommend signing up for the patient portal called "MyChart".  Sign up information is provided on this After Visit Summary.  MyChart is used to connect with patients for Virtual Visits (Telemedicine).  Patients are able to view lab/test results, encounter notes, upcoming appointments, etc.  Non-urgent messages can be sent to your provider as well.   To learn more about what you can do with MyChart, go to [email or web address].    Your next appointment:   1 year(s)  The format for your next appointment:   In Person  Provider:   Will Camnitz, MD   Thank you for choosing [facility]!!   Danette Weinfeld, RN [phone]    Other Instructions    

## 2019-12-25 ENCOUNTER — Ambulatory Visit (INDEPENDENT_AMBULATORY_CARE_PROVIDER_SITE_OTHER): Payer: HMO | Admitting: Podiatry

## 2019-12-25 ENCOUNTER — Ambulatory Visit (INDEPENDENT_AMBULATORY_CARE_PROVIDER_SITE_OTHER): Payer: HMO

## 2019-12-25 DIAGNOSIS — L97511 Non-pressure chronic ulcer of other part of right foot limited to breakdown of skin: Secondary | ICD-10-CM

## 2019-12-25 DIAGNOSIS — M86171 Other acute osteomyelitis, right ankle and foot: Secondary | ICD-10-CM

## 2019-12-25 DIAGNOSIS — E11621 Type 2 diabetes mellitus with foot ulcer: Secondary | ICD-10-CM

## 2019-12-25 NOTE — Patient Instructions (Signed)
Wash wound with saline. Apply a small amount of medihoney on the wound every other day.

## 2019-12-26 ENCOUNTER — Telehealth: Payer: Self-pay | Admitting: Medical

## 2019-12-26 ENCOUNTER — Other Ambulatory Visit: Payer: Self-pay | Admitting: Medical

## 2019-12-26 ENCOUNTER — Encounter: Payer: Self-pay | Admitting: Medical

## 2019-12-26 NOTE — Telephone Encounter (Signed)
Requesting: oxycodone  Contract:08/07/19 UDS:07/27/19 Last Visit:09/07/19 Next Visit:01/01/20 Last Refill:11/27/19  Please Advise

## 2019-12-26 NOTE — Telephone Encounter (Signed)
Alexis [name], -Should take Lasix 60 mg p.o. once a day -Proceed with EGD with EVL at [facility] -Have to hold Plavix 5 days before -Check CBC, CMP, PT/INR at the time of EGD  RG

## 2019-12-26 NOTE — Progress Notes (Signed)
Subjective: Alexis [name] presents the office today for follow up evaluation of an ulceration to the medial aspect the right big toe.  She states they have been cleaning the area with Betadine and then applying Medihoney to the wound.  She states that the toe looks better.  Denies any drainage or pus coming from the toe.  She is still having wound care for the bypass incision site.  Currently denies any fevers, chills, nausea, vomiting.  No calf pain, chest pain, shortness of breath.   Objective: AAO x3, NAD The wound at the distal aspect of the toe is healed.  On the medial aspect of the hallux there is an ulceration measuring 1 x 0.7 cm and hyper granular with a hyperkeratotic periwound.  No significant edema.  No ascending cellulitis peer there is no fluctuation crepitation peer there is no malodor. The callus is present in the right medial heel has resolved.  No open lesions otherwise. No pain with calf compression, swelling, warmth, erythema       Assessment: 69 year old female with ulceration right foot; PAD  Plan: -All treatment options discussed with the patient including all alternatives, risks, complications. -Repeat x-rays obtained reviewed.  No evidence of acute osteomyelitis identified today.  Silver nitrate was present on x-ray. -No signs of osteomyelitis and the wound is stable.  I held off on any further antibiotics today. -I sharply debrided the wound today utilizing the 312 with scalpel to any complications to healthy, viable tissue.  Silver nitrate was applied given hyper granulation tissue.  The wound was then cleansed.  Medihoney was applied followed by dressing.  Continue with similar dressings.  Continue offloading. -Monitor for any clinical signs or symptoms of infection and directed to call the office immediately should any occur or go to the ER.  Return in about 2 weeks (around 01/08/2020).  Trula Slade DPM

## 2019-12-26 NOTE — Telephone Encounter (Signed)
Medication: oxyCODONE (OXY IR/ROXICODONE) 5 MG immediate release tablet    Has the patient contacted their pharmacy? No. (If no, request that the patient contact the pharmacy for the refill.) (If yes, when and what did the pharmacy advise?)  Preferred Pharmacy (with phone number or street name): [place], [place] - [address]  [address], [place] [address]  Phone:  [phone] Fax:  [phone]  DEA #:  --  Agent: Please be advised that RX refills may take up to 3 business days. We ask that you follow-up with your pharmacy.

## 2019-12-27 MED ORDER — METHOCARBAMOL 500 MG PO TABS: ORAL_TABLET | ORAL | 0 refills | Status: DC

## 2019-12-27 MED ORDER — OXYCODONE HCL 5 MG PO TABS: ORAL_TABLET | ORAL | 0 refills | Status: DC

## 2019-12-27 NOTE — Telephone Encounter (Signed)
Rx oxycodone sent to pt pharmacy and refilled robaxin.

## 2019-12-27 NOTE — Addendum Note (Signed)
Addended by: Anabel Halon on: 12/27/2019 01:38 PM   Modules accepted: Orders

## 2020-01-01 ENCOUNTER — Ambulatory Visit: Payer: HMO | Admitting: Pharmacist

## 2020-01-01 DIAGNOSIS — I8500 Esophageal varices without bleeding: Secondary | ICD-10-CM

## 2020-01-01 DIAGNOSIS — I509 Heart failure, unspecified: Secondary | ICD-10-CM | POA: Diagnosis not present

## 2020-01-01 DIAGNOSIS — E785 Hyperlipidemia, unspecified: Secondary | ICD-10-CM

## 2020-01-01 DIAGNOSIS — I110 Hypertensive heart disease with heart failure: Secondary | ICD-10-CM | POA: Diagnosis not present

## 2020-01-01 DIAGNOSIS — I5032 Chronic diastolic (congestive) heart failure: Secondary | ICD-10-CM

## 2020-01-01 DIAGNOSIS — Z4801 Encounter for change or removal of surgical wound dressing: Secondary | ICD-10-CM | POA: Diagnosis not present

## 2020-01-01 DIAGNOSIS — L97509 Non-pressure chronic ulcer of other part of unspecified foot with unspecified severity: Secondary | ICD-10-CM

## 2020-01-01 DIAGNOSIS — Z794 Long term (current) use of insulin: Secondary | ICD-10-CM | POA: Diagnosis not present

## 2020-01-01 DIAGNOSIS — I10 Essential (primary) hypertension: Secondary | ICD-10-CM

## 2020-01-01 DIAGNOSIS — R2681 Unsteadiness on feet: Secondary | ICD-10-CM | POA: Diagnosis not present

## 2020-01-01 DIAGNOSIS — L97513 Non-pressure chronic ulcer of other part of right foot with necrosis of muscle: Secondary | ICD-10-CM | POA: Diagnosis not present

## 2020-01-01 DIAGNOSIS — I4891 Unspecified atrial fibrillation: Secondary | ICD-10-CM | POA: Diagnosis not present

## 2020-01-01 DIAGNOSIS — E1142 Type 2 diabetes mellitus with diabetic polyneuropathy: Secondary | ICD-10-CM | POA: Diagnosis not present

## 2020-01-01 DIAGNOSIS — E11621 Type 2 diabetes mellitus with foot ulcer: Secondary | ICD-10-CM

## 2020-01-01 DIAGNOSIS — Z48812 Encounter for surgical aftercare following surgery on the circulatory system: Secondary | ICD-10-CM | POA: Diagnosis not present

## 2020-01-01 NOTE — Chronic Care Management (AMB) (Addendum)
Chronic Care Management Pharmacy  Name: Alexis [name]  MRN: [id number] DOB: 11-May-1950  Chief Complaint/ HPI  Alexis [name],  69 y.o. , female presents for their Follow-Up CCM visit with the clinical pharmacist via telephone due to COVID-19 Pandemic.  PCP : Mackie Pai, PA-C  Their chronic conditions include:  DM, CHF, PVD, CHF, AFib, HTN, Hypothyroid, Osteoarthritis, Gout, HLD  Office Visits: 12/25/19: Podiatry visit w/ Dr. Jacqualyn Posey - No evidence of acute osteomyelitis. Wound stable. Hold on further abx. Wound debrided. Continue offloading.  Consult Visit: 12/24/19: Cardio visit w/ Dr. Curt Bears - Increase lasix to 79m twice daily for 1 week    Medications: Outpatient Encounter Medications as of 01/01/2020  Medication Sig Note   atorvastatin (LIPITOR) 10 MG tablet Take 1 tablet (10 mg total) by mouth daily.    clopidogrel (PLAVIX) 75 MG tablet Take 1 tablet (75 mg total) by mouth daily.    furosemide (LASIX) 20 MG tablet TAKE 2 TABLETS BY MOUTH IN THE MORNING AND 1 TABLET AT NOON    Insulin Aspart FlexPen 100 UNIT/ML SOPN Inject 0-10 Units into the skin daily as needed (If glucose if over 200). Sliding scale    insulin degludec (TRESIBA) 100 UNIT/ML FlexTouch Pen Inject 14 Units into the skin daily.  11/06/2019: Client state she takes 20 units daily   levocetirizine (XYZAL) 5 MG tablet TAKE 1 TABLET BY MOUTH EVERY EVENING    levothyroxine (SYNTHROID) 150 MCG tablet Take 150 mcg by mouth daily before breakfast.     losartan (COZAAR) 25 MG tablet TAKE ONE (1) TABLET BY MOUTH EVERY Alexis [name] (Patient taking differently: Take 25 mg by mouth daily. )    methocarbamol (ROBAXIN) 500 MG tablet 1 tab po bid    metoprolol succinate (TOPROL-XL) 25 MG 24 hr tablet TAKE ONE (1) TABLET BY MOUTH EVERY Alexis [name]    oxyCODONE (OXY IR/ROXICODONE) 5 MG immediate release tablet TAKE ONE (1) TABLET BY MOUTH EVERY Alexis [name] AS NEEDED FOR SEVERE PAIN    RABEprazole (ACIPHEX) 20 MG tablet Take 1 tablet (20 mg  total) by mouth daily.    [DISCONTINUED] allopurinol (ZYLOPRIM) 100 MG tablet TAKE ONE TABLET BY MOUTH TWICE DAILY    acetaminophen (TYLENOL) 500 MG tablet Take by mouth.    albuterol (VENTOLIN HFA) 108 (90 Base) MCG/ACT inhaler Inhale 2 puffs into the lungs every 6 (six) hours as needed for wheezing or shortness of breath.    AMBULATORY NON FORMULARY MEDICATION Motorized scooter.  Diagnosis: Osteoarthritis M19.90 06/05/2019: Reports her scooter and has some mechanical issues and a technician is scheduled to come and look at it this week.   cephALEXin (KEFLEX) 500 MG capsule Take 1 capsule (500 mg total) by mouth 3 (three) times daily.    cephALEXin (KEFLEX) 500 MG capsule Take 1 capsule (500 mg total) by mouth 3 (three) times daily.    Continuous Blood Gluc Receiver (FREESTYLE LIBRE 14 Alexis [name] READER) DEVI Use as directed to monitor BG.  Dx E11.65    Continuous Blood Gluc Sensor (FREESTYLE LIBRE 14 [name] SENSOR) MISC INJECT INTO THE SKIN EVERY 14 DAYS    doxycycline (VIBRA-TABS) 100 MG tablet Take 1 tablet (100 mg total) by mouth 2 (two) times daily.    fluticasone (FLONASE) 50 MCG/ACT nasal spray Place 2 sprays into both nostrils at bedtime. (Patient taking differently: Place 2 sprays into both nostrils in the morning and at bedtime. )    gabapentin (NEURONTIN) 300 MG capsule Take 300 mg by mouth 3 (three) times  daily. (Patient not taking: Reported on 01/01/2020) 01/01/2020: Developed tremors   Insulin Pen Needle (PEN NEEDLES 31GX5/16") 31G X 8 MM MISC Use as needed 4 times/Alexis [name].  Dx E11.65    levofloxacin (LEVAQUIN) 500 MG tablet Take 1 tablet (500 mg total) by mouth daily.    lidocaine (XYLOCAINE) 4 % external solution Apply topically.    Melatonin 10 MG TABS Take 20 mg by mouth at bedtime.     ondansetron (ZOFRAN-ODT) 4 MG disintegrating tablet Take 4 mg by mouth every 8 (eight) hours as needed for nausea or vomiting.     ONETOUCH VERIO test strip 3 (three) times daily.    Prenatal Vit-Fe Fumarate-FA  (MULTIVITAMIN-PRENATAL) 27-0.8 MG TABS tablet Take 1 tablet by mouth daily.     Probiotic CAPS Take 2 capsules by mouth daily. 50 billion (gummy)    sertraline (ZOLOFT) 50 MG tablet Take 1 tablet (50 mg total) by mouth daily. (Patient not taking: Reported on 01/01/2020)    spironolactone (ALDACTONE) 100 MG tablet Take 1 tablet (100 mg total) by mouth daily. 01/01/2020: Doesn't have at home, but knows she is supposed   triamcinolone ointment (KENALOG) 0.1 % Apply to    No facility-administered encounter medications on file as of 01/01/2020.   Immunization History  Administered Date(s) Administered   Fluad Quad(high Dose 65+) 11/08/2018   Hepatitis B, adult 01/31/2019   Hepatitis B, ped/adol 03/19/2019, 03/19/2019, 08/14/2019, 08/14/2019   Influenza Split 01/05/2007, 12/23/2010, 01/03/2012, 05/11/2012   Influenza, High Dose Seasonal PF 01/09/2015, 12/24/2015, 01/14/2017, 12/12/2017   Influenza,inj,Quad PF,6+ Mos 12/24/2013, 01/09/2015   Influenza-Unspecified 01/05/2007, 12/23/2010, 01/03/2012, 05/11/2012, 01/09/2015   PFIZER SARS-COV-2 Vaccination 06/16/2019, 07/09/2019   Pneumococcal Conjugate-13 01/09/2015   Pneumococcal Polysaccharide-23 05/11/2012   Td 12/23/2010   Tdap 12/23/2010     Current Diagnosis/Assessment:  Goals Addressed             This Visit's Progress    Chronic Care Management Pharmacy Care Plan       CARE PLAN ENTRY (see longitudinal plan of care for additional care plan information)  Current Barriers:  Chronic Disease Management support, education, and care coordination needs related to Diabetes, Heart Failure, Peripheral Vascular Disease, AFib, Hypertension, Hypothyroidism, Osteoarthritis, Gout, Hyperlipidemia   Hypertension BP Readings from Last 3 Encounters:  12/24/19 128/60  10/05/19 124/72  09/18/19 130/72   Pharmacist Clinical Goal(s): Over the next 90 days, patient will work with PharmD and providers to maintain BP goal <130/80 Current regimen:    Losartan 13m daily Metoprolol Succinate 283mdaily  Spironolactone 2568maily Interventions: Discussed BP goal Requested patient to check BP 2-3 times per week and record Patient self care activities - Over the next 90 days, patient will: Check BP 2-3 times per week, document, and provide at future appointments Ensure daily salt intake < 2300 mg/Nithin Demeo  Hyperlipidemia Lab Results  Component Value Date/Time   LDLCALC 33 05/10/2018 09:44 AM   LDLDIRECT 111.0 04/10/2015 10:40 AM   Pharmacist Clinical Goal(s): Over the next 90 days, patient will work with PharmD and providers to maintain LDL goal < 70 Current regimen:  Atorvastatin 19m65mily  Patient self care activities - Over the next 90 days, patient will: Maintain cholesterol medication regimen.   Diabetes Lab Results  Component Value Date/Time   HGBA1C 7.7 (H) 05/26/2019 04:49 PM   HGBA1C 9.0 04/03/2015 12:00 AM   Pharmacist Clinical Goal(s): Over the next 90 days, patient will work with PharmD and providers to achieve A1c goal <7% Current regimen:  Tresiba 16 units daily Novolog 4-10 units before meals Interventions: Discussed DM goals Patient self care activities - Over the next 90 days, patient will: Check blood sugar once daily, document, and provide at future appointments Contact provider with any episodes of hypoglycemia  Heart Failure Pharmacist Clinical Goal(s) Over the next 90 days, patient will work with PharmD and providers to reduce symptoms associated with heart failure Current regimen:  Furosemide 25m 2AM and 1 PM Losartan 271mdaily AM Metoprolol succinate 2581maily AM Spironolactone 45m40mily Interventions: Discussed importance of weighing daily Recommended patient to start increased furosemide dose as recommended per cardio Recommended patient get prescription of spironolactone Patient self care activities - Over the next 90 days, patient will: Maintain heart failure medication  regimen  Medication management Pharmacist Clinical Goal(s): Over the next 90 days, patient will work with PharmD and providers to achieve optimal medication adherence Current pharmacy: [name] switched to [facility] Interventions Comprehensive medication review performed. Utilize [facility] for medication synchronization, packaging and delivery Patient self care activities - Over the next 90 days, patient will: Focus on medication adherence by filling and taking medications appropriately  Take medications as prescribed Report any questions or concerns to PharmD and/or provider(s)  Please see past updates related to this goal by clicking on the "Past Updates" button in the selected goal         Patient is very confused about medication regimen.  Significant time spent addressing DM as that is the most pressing issue for the patient noting her diabetic foot infections and a1c >/= 8%.   Emergency Bypass surgery 09/2019  Has two grandkids (girl (7yea80s old and boy)  Groin Wound/Toe Wound    Patient has failed these meds in past: None noted  Patient is currently controlled on the following medications:  Gaping hole in groin wound Home health coming daily to clean wound Was told by nurse that her wound is "filling in"  Feels her toe is healing  Plan -Continue current management  Morphea   Followed by Derm  Patient has failed these meds in past: None noted  Patient is currently controlled on the following medications: Triamcinoloone 0.1% ointment  Was diagnosed by derm Developed sores on her stomach Declines further biopsy or surgeries  Plan -Continue current medications   Diabetes   Followed by Endo (Autumn Jones, PA-C)  A1c goal <7%   Problem Story During initial CCM visit in 04/2019 patient was only on bolus insulin per sliding scale with a1c 9% or greater. Coordinated care for patient to be seen by endo to start a basal/bolus regimen or pursue  V-go insulin pump patient used in past. Patient was eventually started on basal/bolus regimen.  A1c has improved, but still above goal.   Recent Relevant Labs: Lab Results  Component Value Date/Time   HGBA1C 7.7 (H) 05/26/2019 04:49 PM   HGBA1C 9.0 04/03/2015 12:00 AM   GFR 57.41 (L) 05/23/2019 01:38 PM   GFR 52.36 (L) 04/12/2019 08:47 AM   MICROALBUR 0.3 04/10/2015 10:40 AM    Last diabetic Eye exam:  Lab Results  Component Value Date/Time   HMDIABEYEEXA No Retinopathy 04/13/2017 12:00 AM    Last diabetic Foot exam: No results found for: HMDIABFOOTEX   Checking BG: Daily with Freestyle Libre  Patient is currently uncontrolled on the following medications:  Tresiba 16 units daily Novolog 4-10 units before meals   Last diabetic Eye exam:  Lab Results  Component Value Date/Time   HMDIABEYEEXA No Retinopathy  04/13/2017 12:00 AM    Last diabetic Foot exam: No results found for: HMDIABFOOTEX   We discussed:  Importance of having a basal/bolus regimen or getting set up with V-go insulin pump to gain better DM control    Update 01/01/20 No specific readings to report.   Reports Novolog Scale to be the following 200: 0 units 201-250: 2 units 251-300: 4 units 301-350: 6 units 351-400: 8 units 401-450: 10 units  Plan -Continue current medications -Follow up with Autumn Jones  Heart Failure   Type: Combined Systolic and Diastolic  Last ejection fraction: 09/2018 40%  Patient has failed these meds in past: ramipril (low bp, renal function fluctuation?) Patient is currently on the following medications:  Furosemide 66m 2AM and 1 PM Losartan 270mdaily AM Metoprolol succinate 2526maily AM Spironolactone 73m74mily (Doesn't have at home currently)  Gained 20lbs over two days about 2-3 weekends ago. Resolved after doing usual regimen and being more strict about her diet.   She has developed 10lb weight gain again and was told to start regimen of furosemide 40mg36mce  daily Encouraged patient to start regiment of increased furosemide and encouraged her to receive refill of spironolactone  Plan -Restart spironolactone -Start prescribed regiment of furosemide 40mg 76me daily -Continue to monitor weight daily  Hypertension   BP goal is:  <130/80  Office blood pressures are  BP Readings from Last 3 Encounters:  12/24/19 128/60  10/05/19 124/72  09/18/19 130/72   Patient checks BP at home infrequently Patient home BP readings are ranging: Unable to assess  Patient has failed these meds in the past: ramipril (low bp, renal function fluctuation?) Patient is currently controlled on the following medications:  Losartan 25mg d43m Metoprolol Succinate 25mg da70m Spironolactone 25mg dai69mdoes not currently have)  We discussed  BP goal  Plan -Check BP 2-3 times per week and record -Continue current medications    Hyperlipidemia   LDL goal < 70  Last lipids Lab Results  Component Value Date   CHOL 93 (L) 05/10/2018   HDL 31 (L) 05/10/2018   LDLCALC 33 05/10/2018   LDLDIRECT 111.0 04/10/2015   TRIG 147 05/10/2018   CHOLHDL 3.0 05/10/2018   Hepatic Function Latest Ref Rng & Units 09/18/2019 05/25/2019 05/23/2019  Total Protein 6.0 - 8.5 g/dL 6.9 7.4 7.4  Albumin 3.8 - 4.8 g/dL 3.7(L) 3.7 4.0  AST 0 - 40 IU/L 47(H) 163(H) 78(H)  ALT 0 - 32 IU/L 27 135(H) 69(H)  Alk Phosphatase 48 - 121 IU/L 297(H) 218(H) 256(H)  Total Bilirubin 0.0 - 1.2 mg/dL 0.9 1.1 0.9  Bilirubin, Direct 0.00 - 0.40 mg/dL - - -     The ASCVD Risk score (Goff [place] Mikey Bussinget al., 2013) failed to calculate for the following reasons:   The patient has a prior MI or stroke diagnosis   Patient has failed these meds in past: None noted  Patient is currently controlled on the following medications:  Atorvastatin 10mg dail15m   Plan -Continue current medications   Hypothyroidism   Followed by Endo (Autumn JoPeri Jeffersonab Results  Component Value Date/Time    TSH 2.09 03/15/2017 12:05 PM   TSH 15.50 (H) 12/16/2016 10:06 AM   FREET4 0.79 04/06/2016 01:57 AM    Patient has failed these meds in past: None noted  Patient is currently controlled on the following medications:  Levothyroxine 173mcg dail87mPlan -Continue current medications   Gout   Uric Acid,  Serum  Date Value Ref Range Status  11/24/2016 7.5 (H) 2.4 - 7.0 mg/dL Final  08/27/2016 7.7 (H) 2.4 - 7.0 mg/dL Final  08/05/2014 7.3 (H) 2.4 - 7.0 mg/dL Final     Goal Uric Acid < 6 mg/dL   Medications that may increase uric acid levels: Loop Diuretics   Last gout flare: When hospitalized due to not receiving allopurinol  Patient has failed these meds in past: None noted  Patient is currently controlled on the following medications:  Allopurinol 173m twice daily AM and Dinner  Plan -Continue current medications   Depression   Will assess regimen and medication options further at next visit Significant time spent on other portions of the visit.   [facility]  Decreased Interest 3  Down, Depressed, Hopeless 0  PHQ - 2 Score 3  Altered sleeping 3  Tired, decreased energy 0  Change in appetite 0  Feeling bad or failure about yourself  0  Trouble concentrating 3  Moving slowly or fidgety/restless 0  Suicidal thoughts 0  PHQ-9 Score 9  Some recent data might be hidden    Patient has failed these meds in past: amitriptyline, venlafaxine, desvenlafaxine, buspirone (listed in D/C meds. No apparent reason for D/C), duloxetine (intoleration) Patient is currently uncontrolled on the following medications:  Sertraline 557mdaily (prescribed, but not taking)  Plan -Discuss options at next visit   GERD   Patient has failed these meds in past: None noted  Patient is currently controlled on the following medications:  Rabeprazole 2020mwice daily  Breakthrough Sx: No  States new GI was told her she needed to get an EGD, but she is unsure if  would like to complete this. States she has to get GI banding every 2 months  Pt states she does not have this medication at home. Noting GI history encouraged patient to get his refilled.   Plan -Obtain refill of rabeprazole   Medication Management   [facility]  Patient admits keeping up with her medications is confusing and would appreciate adherence packaging, synchronization, and delivery.  We discussed: Verbal consent obtained for [facility] enhanced pharmacy services (medication synchronization, adherence packaging, delivery coordination). A medication sync plan was created to allow patient to get all medications delivered once every 30 to 90 days per patient preference. Patient understands they have freedom to choose pharmacy and clinical pharmacist will coordinate care between all prescribers and [facility].   Plan  Utilize [facility] for medication synchronization, packaging and delivery   Follow up:  1 month phone visit  Miscellaneous Meds Prenatal vitamin Metamucil  Ondansetron Mupirocin  lidocaine Dicloxacillin  KanDe BlanchharmD Clinical Pharmacist [phone] have collaborated with the care management provider regarding care management and care coordination activities outlined in this encounter and have reviewed this encounter including documentation in the note and care plan. I am certifying that I agree with the content of this note and encounter as supervising provider.  EdwMackie PaiA-C

## 2020-01-02 DIAGNOSIS — I10 Essential (primary) hypertension: Secondary | ICD-10-CM | POA: Diagnosis not present

## 2020-01-02 DIAGNOSIS — E1165 Type 2 diabetes mellitus with hyperglycemia: Secondary | ICD-10-CM | POA: Diagnosis not present

## 2020-01-02 DIAGNOSIS — Z794 Long term (current) use of insulin: Secondary | ICD-10-CM | POA: Diagnosis not present

## 2020-01-02 DIAGNOSIS — E1142 Type 2 diabetes mellitus with diabetic polyneuropathy: Secondary | ICD-10-CM | POA: Diagnosis not present

## 2020-01-04 ENCOUNTER — Other Ambulatory Visit: Payer: Self-pay | Admitting: Medical

## 2020-01-04 DIAGNOSIS — M109 Gout, unspecified: Secondary | ICD-10-CM

## 2020-01-07 ENCOUNTER — Telehealth: Payer: Self-pay | Admitting: Pharmacist

## 2020-01-07 NOTE — Progress Notes (Addendum)
Chronic Care Management Pharmacy Assistant   Name: Alexis [name]  MRN: [id number] DOB: [birth year]  Reason for Encounter: Disease State  Patient Questions:  1.  Have you seen any other providers since your last visit? Yes  2.  Any changes in your medicines or health? Yes    PCP : Mackie Pai, PA-C   Their chronic conditions include: DM, CHF, PVD, CHF, AFib, HTN, Hypothyroid, Osteoarthritis, Gout, HLD.  Office Visits: None since their last CCM visit with the clinical pharmacist on 01-01-2020.  Consults: 01-02-2020 (Endo) Patient presented in the office for a f/u of DM II, HTN and Neuropathy. Her wound vac is off her abdomen now, still following up with wound care. Patient had vascular surgery. Is now following with Podiatry.  Gabapentin helped with her symptoms, but caused tremors. This medication has been discontinued. Notes indicate Endo will try to get a Libre 2 to use instead of the [place] 14 day due to patient checking their blood glucose at least 4 times per day using a continuous glucose monitor.   Allergies:   Allergies  Allergen Reactions   Indomethacin Other (See Comments)    Renal Insufficiency   Pregabalin Other (See Comments)    DIZZINESS    Sulfa Antibiotics Hives   Clindamycin/Lincomycin    Morphine And Related Nausea And Vomiting    Medications: Outpatient Encounter Medications as of 01/07/2020  Medication Sig Note   acetaminophen (TYLENOL) 500 MG tablet Take by mouth.    albuterol (VENTOLIN HFA) 108 (90 Base) MCG/ACT inhaler Inhale 2 puffs into the lungs every 6 (six) hours as needed for wheezing or shortness of breath.    allopurinol (ZYLOPRIM) 100 MG tablet TAKE ONE TABLET BY MOUTH TWICE DAILY    AMBULATORY NON FORMULARY MEDICATION Motorized scooter.  Diagnosis: Osteoarthritis M19.90 06/05/2019: Reports her scooter and has some mechanical issues and a technician is scheduled to come and look at it this week.   atorvastatin (LIPITOR) 10 MG tablet Take  1 tablet (10 mg total) by mouth daily.    cephALEXin (KEFLEX) 500 MG capsule Take 1 capsule (500 mg total) by mouth 3 (three) times daily.    cephALEXin (KEFLEX) 500 MG capsule Take 1 capsule (500 mg total) by mouth 3 (three) times daily.    clopidogrel (PLAVIX) 75 MG tablet Take 1 tablet (75 mg total) by mouth daily.    Continuous Blood Gluc Receiver (FREESTYLE LIBRE 14 DAY READER) DEVI Use as directed to monitor BG.  Dx E11.65    Continuous Blood Gluc Sensor (FREESTYLE LIBRE 14 DAY SENSOR) MISC INJECT INTO THE SKIN EVERY 14 DAYS    doxycycline (VIBRA-TABS) 100 MG tablet Take 1 tablet (100 mg total) by mouth 2 (two) times daily.    fluticasone (FLONASE) 50 MCG/ACT nasal spray Place 2 sprays into both nostrils at bedtime. (Patient taking differently: Place 2 sprays into both nostrils in the morning and at bedtime. )    furosemide (LASIX) 20 MG tablet TAKE 2 TABLETS BY MOUTH IN THE MORNING AND 1 TABLET AT NOON    gabapentin (NEURONTIN) 300 MG capsule Take 300 mg by mouth 3 (three) times daily. (Patient not taking: Reported on 01/01/2020) 01/01/2020: Developed tremors   Insulin Aspart FlexPen 100 UNIT/ML SOPN Inject 0-10 Units into the skin daily as needed (If glucose if over 200). Sliding scale    insulin degludec (TRESIBA) 100 UNIT/ML FlexTouch Pen Inject 14 Units into the skin daily.  11/06/2019: Client state she takes 20 units daily  Insulin Pen Needle (PEN NEEDLES 31GX5/16") 31G X 8 MM MISC Use as needed 4 times/day.  Dx E11.65    levocetirizine (XYZAL) 5 MG tablet TAKE 1 TABLET BY MOUTH EVERY EVENING    levofloxacin (LEVAQUIN) 500 MG tablet Take 1 tablet (500 mg total) by mouth daily.    levothyroxine (SYNTHROID) 150 MCG tablet Take 150 mcg by mouth daily before breakfast.     lidocaine (XYLOCAINE) 4 % external solution Apply topically.    losartan (COZAAR) 25 MG tablet TAKE ONE (1) TABLET BY MOUTH EVERY DAY (Patient taking differently: Take 25 mg by mouth daily. )    Melatonin 10 MG TABS Take 20  mg by mouth at bedtime.     methocarbamol (ROBAXIN) 500 MG tablet 1 tab po bid    metoprolol succinate (TOPROL-XL) 25 MG 24 hr tablet TAKE ONE (1) TABLET BY MOUTH EVERY DAY    ondansetron (ZOFRAN-ODT) 4 MG disintegrating tablet Take 4 mg by mouth every 8 (eight) hours as needed for nausea or vomiting.     ONETOUCH VERIO test strip 3 (three) times daily.    oxyCODONE (OXY IR/ROXICODONE) 5 MG immediate release tablet TAKE ONE (1) TABLET BY MOUTH EVERY DAY AS NEEDED FOR SEVERE PAIN    Prenatal Vit-Fe Fumarate-FA (MULTIVITAMIN-PRENATAL) 27-0.8 MG TABS tablet Take 1 tablet by mouth daily.     Probiotic CAPS Take 2 capsules by mouth daily. 50 billion (gummy)    RABEprazole (ACIPHEX) 20 MG tablet Take 1 tablet (20 mg total) by mouth daily.    sertraline (ZOLOFT) 50 MG tablet Take 1 tablet (50 mg total) by mouth daily. (Patient not taking: Reported on 01/01/2020)    spironolactone (ALDACTONE) 100 MG tablet Take 1 tablet (100 mg total) by mouth daily. 01/01/2020: Doesn't have at home, but knows she is supposed   triamcinolone ointment (KENALOG) 0.1 % Apply to    No facility-administered encounter medications on file as of 01/07/2020.    Current Diagnosis: Patient Active Problem List   Diagnosis Date Noted   Depression 05/26/2019   Cellulitis 05/26/2019   Other cirrhosis of liver ([place]) 38/88/2800   Chronic systolic CHF (congestive heart failure) ([place]) 05/26/2019   Cellulitis in diabetic foot ([place]) 05/25/2019   Epigastric pain    Idiopathic esophageal varices without bleeding ([place])    Anemia due to chronic blood loss    Paroxysmal atrial fibrillation ([place]) 09/26/2018   Macrocytic anemia 09/04/2018   Pacemaker 08/25/2018   Insulin dependent diabetes mellitus with complications 34/91/7915   Constipation 08/25/2018   Fluid overload 08/25/2018   Chronic anticoagulation 08/25/2018   Morbid obesity ([place]) 08/25/2018   Chronic diastolic heart failure ([place]) 05/17/2018   Acute bronchitis with COPD ([place])  01/16/2018   Acquired absence of other right toe(s) ([place]) 11/16/2017   Type 2 diabetes mellitus with foot ulcer ([place]) 11/16/2017   Crush injury of left foot, initial encounter 11/28/2016   Osteomyelitis ([place]) 07/30/2016   Gangrene of foot ([place]) 04/27/2016   Thrombocytopenia ([place])    Vomiting and diarrhea 04/05/2016   Hyperlipidemia 11/18/2015   Gout 08/05/2014   Cough 08/05/2014   Allergic rhinitis 07/12/2014   Osteoarthritis 07/12/2014   Asthma 07/12/2014   Congestive heart failure ([place]) 07/12/2014   Permanent atrial fibrillation ([place]) 07/12/2014   HTN (hypertension) 07/12/2014   Hypothyroidism 07/12/2014   History of substance abuse ([place]) 07/12/2014   Peripheral vascular disease ([place]) 04/02/2013   Discoloration of skin 04/02/2013    Goals Addressed   None    Contacted  the patient for a General Adherence check to see if her weight has decreased since increasing her furosemide. Patient states her weight is has increased six pounds in the past week. She has been taking two 20 mg tabs in the morning and one 20 mg tabs in afternoon. Today she started taking two 20 mg tabs in the morning and two in the afternoon. She has also started taking her spironolactone 100 MG tablet on Saturday. Overall her wound care is healing well. She still gets wound care, a nurse comes in once a week. Patient reports her toe is almost healed completely.  Follow-Up:  Pharmacist Review   Fanny Skates, [place] Pharmacist Assistant [phone]  Reviewed by: De Blanch, PharmD Clinical Pharmacist [facility] at [facility] [phone]   I have collaborated with the care management provider regarding care management and care coordination activities outlined in this encounter and have reviewed this encounter including documentation in the note and care plan. I am certifying that I agree with the content of this note and encounter as supervising provider.  Mackie Pai, PA-C

## 2020-01-08 ENCOUNTER — Other Ambulatory Visit: Payer: Self-pay

## 2020-01-08 ENCOUNTER — Ambulatory Visit (INDEPENDENT_AMBULATORY_CARE_PROVIDER_SITE_OTHER): Payer: HMO | Admitting: Podiatry

## 2020-01-08 DIAGNOSIS — Z7901 Long term (current) use of anticoagulants: Secondary | ICD-10-CM | POA: Diagnosis not present

## 2020-01-08 DIAGNOSIS — L84 Corns and callosities: Secondary | ICD-10-CM | POA: Diagnosis not present

## 2020-01-08 DIAGNOSIS — Z794 Long term (current) use of insulin: Secondary | ICD-10-CM

## 2020-01-08 DIAGNOSIS — L97511 Non-pressure chronic ulcer of other part of right foot limited to breakdown of skin: Secondary | ICD-10-CM | POA: Diagnosis not present

## 2020-01-08 DIAGNOSIS — L97509 Non-pressure chronic ulcer of other part of unspecified foot with unspecified severity: Secondary | ICD-10-CM

## 2020-01-08 DIAGNOSIS — E11621 Type 2 diabetes mellitus with foot ulcer: Secondary | ICD-10-CM

## 2020-01-08 NOTE — Patient Instructions (Signed)
Visit Information  Goals Addressed            This Visit's Progress    Chronic Care Management Pharmacy Care Plan       CARE PLAN ENTRY (see longitudinal plan of care for additional care plan information)  Current Barriers:   Chronic Disease Management support, education, and care coordination needs related to Diabetes, Heart Failure, Peripheral Vascular Disease, AFib, Hypertension, Hypothyroidism, Osteoarthritis, Gout, Hyperlipidemia   Hypertension BP Readings from Last 3 Encounters:  12/24/19 128/60  10/05/19 124/72  09/18/19 130/72    Pharmacist Clinical Goal(s): o Over the next 90 days, patient will work with PharmD and providers to maintain BP goal <130/80  Current regimen:   Losartan 71m daily  Metoprolol Succinate 221mdaily   Spironolactone 2576maily  Interventions: o Discussed BP goal o Requested patient to check BP 2-3 times per week and record  Patient self care activities - Over the next 90 days, patient will: o Check BP 2-3 times per week, document, and provide at future appointments o Ensure daily salt intake < 2300 mg/Kristen Bushway  Hyperlipidemia Lab Results  Component Value Date/Time   LDLCALC 33 05/10/2018 09:44 AM   LDLDIRECT 111.0 04/10/2015 10:40 AM    Pharmacist Clinical Goal(s): o Over the next 90 days, patient will work with PharmD and providers to maintain LDL goal < 70  Current regimen:  o Atorvastatin 2m82mily   Patient self care activities - Over the next 90 days, patient will: o Maintain cholesterol medication regimen.   Diabetes Lab Results  Component Value Date/Time   HGBA1C 7.7 (H) 05/26/2019 04:49 PM   HGBA1C 9.0 04/03/2015 12:00 AM    Pharmacist Clinical Goal(s): o Over the next 90 days, patient will work with PharmD and providers to achieve A1c goal <7%  Current regimen:   Tresiba 16 units daily  Novolog 4-10 units before meals  Interventions: o Discussed DM goals  Patient self care activities - Over the next  90 days, patient will: o Check blood sugar once daily, document, and provide at future appointments o Contact provider with any episodes of hypoglycemia  Heart Failure  Pharmacist Clinical Goal(s) o Over the next 90 days, patient will work with PharmD and providers to reduce symptoms associated with heart failure  Current regimen:   Furosemide 20mg67m and 1 PM  Losartan 25mg 61my AM  Metoprolol succinate 25mg d48m AM  Spironolactone 50mg da35m Interventions: o Discussed importance of weighing daily o Recommended patient to start increased furosemide dose as recommended per cardio o Recommended patient get prescription of spironolactone  Patient self care activities - Over the next 90 days, patient will: o Maintain heart failure medication regimen  Medication management  Pharmacist Clinical Goal(s): o Over the next 90 days, patient will work with PharmD and providers to achieve optimal medication adherence  [place] [facility]  Interventions o Comprehensive medication review performed. o Utilize [facility] for medication synchronization, packaging and delivery  Patient self care activities - Over the next 90 days, patient will: o Focus on medication adherence by filling and taking medications appropriately  o Take medications as prescribed o Report any questions or concerns to PharmD and/or provider(s)  Please see past updates related to this goal by clicking on the "Past Updates" button in the selected goal         The patient verbalized understanding of instructions provided today and agreed to receive a mailed copy of patient instruction  and/or Scientist, clinical (histocompatibility and immunogenetics).  Telephone follow up appointment with pharmacy team member scheduled for: 01/30/2020  Verbal consent obtained for [facility] enhanced pharmacy services (medication synchronization, adherence packaging, delivery coordination). A medication sync plan  was created to allow patient to get all medications delivered once every 30 to 90 days per patient preference. Patient understands they have freedom to choose pharmacy and clinical pharmacist will coordinate care between all prescribers and [facility].    De Blanch, PharmD Clinical Pharmacist [facility] at [facility] [phone]

## 2020-01-09 ENCOUNTER — Other Ambulatory Visit: Payer: Self-pay | Admitting: Cardiology

## 2020-01-09 ENCOUNTER — Other Ambulatory Visit: Payer: Self-pay

## 2020-01-09 ENCOUNTER — Encounter: Payer: Self-pay | Admitting: Medical

## 2020-01-09 ENCOUNTER — Telehealth: Payer: Self-pay | Admitting: Pharmacist

## 2020-01-09 DIAGNOSIS — M109 Gout, unspecified: Secondary | ICD-10-CM

## 2020-01-09 MED ORDER — ATORVASTATIN CALCIUM 10 MG PO TABS: 10.0000 mg | ORAL_TABLET | Freq: Every day | ORAL | 3 refills | Status: DC

## 2020-01-09 MED ORDER — METOPROLOL SUCCINATE ER 25 MG PO TB24: ORAL_TABLET | ORAL | 1 refills | Status: AC

## 2020-01-09 MED ORDER — METHOCARBAMOL 500 MG PO TABS
ORAL_TABLET | ORAL | 0 refills | Status: DC
Start: 2020-01-09 — End: 2020-01-30

## 2020-01-09 MED ORDER — FUROSEMIDE 20 MG PO TABS: ORAL_TABLET | ORAL | 3 refills | Status: DC

## 2020-01-09 MED ORDER — CLOPIDOGREL BISULFATE 75 MG PO TABS: 75.0000 mg | ORAL_TABLET | Freq: Every day | ORAL | 3 refills | Status: DC

## 2020-01-09 MED ORDER — LOSARTAN POTASSIUM 25 MG PO TABS
25.0000 mg | ORAL_TABLET | Freq: Every day | ORAL | 3 refills | Status: DC
Start: 2020-01-09 — End: 2020-05-28

## 2020-01-09 MED ORDER — ALLOPURINOL 100 MG PO TABS: 100.0000 mg | ORAL_TABLET | Freq: Two times a day (BID) | ORAL | 3 refills | Status: DC

## 2020-01-09 MED ORDER — LEVOCETIRIZINE DIHYDROCHLORIDE 5 MG PO TABS
5.0000 mg | ORAL_TABLET | Freq: Every evening | ORAL | 2 refills | Status: DC
Start: 2020-01-09 — End: 2020-06-03

## 2020-01-09 NOTE — Telephone Encounter (Signed)
*  STAT* If patient is at the pharmacy, call can be transferred to refill team.   1. Which medications need to be refilled? (please list name of each medication and dose if known) furosemide (LASIX) 20 MG tablet, losartan (COZAAR) 25 MG tablet  2. Which pharmacy/location (including street and city if local pharmacy) is medication to be sent to? [facility] - [place], [place] - [place] [address]  3. Do they need a 30 day or 90 day supply? [place]

## 2020-01-09 NOTE — Telephone Encounter (Signed)
Refill sent in per request.  

## 2020-01-09 NOTE — Progress Notes (Addendum)
Chronic Care Management Pharmacy Assistant   Name: Alexis [name]  MRN: [id number] DOB: 1950/09/24  Reason for Encounter: Medication Review   PCP : Mackie Pai, PA-C  Allergies:   Allergies  Allergen Reactions   Indomethacin Other (See Comments)    Renal Insufficiency   Pregabalin Other (See Comments)    DIZZINESS    Sulfa Antibiotics Hives   Clindamycin/Lincomycin    Morphine And Related Nausea And Vomiting    Medications: Outpatient Encounter Medications as of 01/09/2020  Medication Sig Note   acetaminophen (TYLENOL) 500 MG tablet Take by mouth.    albuterol (VENTOLIN HFA) 108 (90 Base) MCG/ACT inhaler Inhale 2 puffs into the lungs every 6 (six) hours as needed for wheezing or shortness of breath.    allopurinol (ZYLOPRIM) 100 MG tablet TAKE ONE TABLET BY MOUTH TWICE DAILY    AMBULATORY NON FORMULARY MEDICATION Motorized scooter.  Diagnosis: Osteoarthritis M19.90 06/05/2019: Reports her scooter and has some mechanical issues and a technician is scheduled to come and look at it this week.   atorvastatin (LIPITOR) 10 MG tablet Take 1 tablet (10 mg total) by mouth daily.    cephALEXin (KEFLEX) 500 MG capsule Take 1 capsule (500 mg total) by mouth 3 (three) times daily.    cephALEXin (KEFLEX) 500 MG capsule Take 1 capsule (500 mg total) by mouth 3 (three) times daily.    clopidogrel (PLAVIX) 75 MG tablet Take 1 tablet (75 mg total) by mouth daily.    Continuous Blood Gluc Receiver (FREESTYLE LIBRE 14 DAY READER) DEVI Use as directed to monitor BG.  Dx E11.65    Continuous Blood Gluc Sensor (FREESTYLE LIBRE 14 DAY SENSOR) MISC INJECT INTO THE SKIN EVERY 14 DAYS    doxycycline (VIBRA-TABS) 100 MG tablet Take 1 tablet (100 mg total) by mouth 2 (two) times daily.    fluticasone (FLONASE) 50 MCG/ACT nasal spray Place 2 sprays into both nostrils at bedtime. (Patient taking differently: Place 2 sprays into both nostrils in the morning and at bedtime. )    furosemide (LASIX) 20  MG tablet TAKE 2 TABLETS BY MOUTH IN THE MORNING AND 1 TABLET AT NOON    gabapentin (NEURONTIN) 300 MG capsule Take 300 mg by mouth 3 (three) times daily. (Patient not taking: Reported on 01/01/2020) 01/01/2020: Developed tremors   Insulin Aspart FlexPen 100 UNIT/ML SOPN Inject 0-10 Units into the skin daily as needed (If glucose if over 200). Sliding scale    insulin degludec (TRESIBA) 100 UNIT/ML FlexTouch Pen Inject 14 Units into the skin daily.  11/06/2019: Client state she takes 20 units daily   Insulin Pen Needle (PEN NEEDLES 31GX5/16") 31G X 8 MM MISC Use as needed 4 times/day.  Dx E11.65    levocetirizine (XYZAL) 5 MG tablet TAKE 1 TABLET BY MOUTH EVERY EVENING    levofloxacin (LEVAQUIN) 500 MG tablet Take 1 tablet (500 mg total) by mouth daily.    levothyroxine (SYNTHROID) 150 MCG tablet Take 150 mcg by mouth daily before breakfast.     lidocaine (XYLOCAINE) 4 % external solution Apply topically.    losartan (COZAAR) 25 MG tablet TAKE ONE (1) TABLET BY MOUTH EVERY DAY (Patient taking differently: Take 25 mg by mouth daily. )    Melatonin 10 MG TABS Take 20 mg by mouth at bedtime.     methocarbamol (ROBAXIN) 500 MG tablet 1 tab po bid    metoprolol succinate (TOPROL-XL) 25 MG 24 hr tablet TAKE ONE (1) TABLET BY MOUTH EVERY DAY  ondansetron (ZOFRAN-ODT) 4 MG disintegrating tablet Take 4 mg by mouth every 8 (eight) hours as needed for nausea or vomiting.     ONETOUCH VERIO test strip 3 (three) times daily.    oxyCODONE (OXY IR/ROXICODONE) 5 MG immediate release tablet TAKE ONE (1) TABLET BY MOUTH EVERY DAY AS NEEDED FOR SEVERE PAIN    Prenatal Vit-Fe Fumarate-FA (MULTIVITAMIN-PRENATAL) 27-0.8 MG TABS tablet Take 1 tablet by mouth daily.     Probiotic CAPS Take 2 capsules by mouth daily. 50 billion (gummy)    RABEprazole (ACIPHEX) 20 MG tablet Take 1 tablet (20 mg total) by mouth daily.    sertraline (ZOLOFT) 50 MG tablet Take 1 tablet (50 mg total) by mouth daily. (Patient not taking: Reported  on 01/01/2020)    spironolactone (ALDACTONE) 100 MG tablet Take 1 tablet (100 mg total) by mouth daily. 01/01/2020: Doesn't have at home, but knows she is supposed   triamcinolone ointment (KENALOG) 0.1 % Apply to    No facility-administered encounter medications on file as of 01/09/2020.    Current Diagnosis: Patient Active Problem List   Diagnosis Date Noted   Depression 05/26/2019   Cellulitis 05/26/2019   Other cirrhosis of liver ([place]) 39/76/7341   Chronic systolic CHF (congestive heart failure) ([place]) 05/26/2019   Cellulitis in diabetic foot ([place]) 05/25/2019   Epigastric pain    Idiopathic esophageal varices without bleeding ([place])    Anemia due to chronic blood loss    Paroxysmal atrial fibrillation ([place]) 09/26/2018   Macrocytic anemia 09/04/2018   Pacemaker 08/25/2018   Insulin dependent diabetes mellitus with complications 93/79/0240   Constipation 08/25/2018   Fluid overload 08/25/2018   Chronic anticoagulation 08/25/2018   Morbid obesity ([place]) 08/25/2018   Chronic diastolic heart failure ([place]) 05/17/2018   Acute bronchitis with COPD ([place]) 01/16/2018   Acquired absence of other right toe(s) ([place]) 11/16/2017   Type 2 diabetes mellitus with foot ulcer ([place]) 11/16/2017   Crush injury of left foot, initial encounter 11/28/2016   Osteomyelitis ([place]) 07/30/2016   Gangrene of foot ([place]) 04/27/2016   Thrombocytopenia (H[facility])    Vomiting and diarrhea 04/05/2016   Hyperlipidemia 11/18/2015   Gout 08/05/2014   Cough 08/05/2014   Allergic rhinitis 07/12/2014   Osteoarthritis 07/12/2014   Asthma 07/12/2014   Congestive heart failure ([place]) 07/12/2014   Permanent atrial fibrillation ([place]) 07/12/2014   HTN (hypertension) 07/12/2014   Hypothyroidism 07/12/2014   History of substance abuse ([place]) 07/12/2014   Peripheral vascular disease ([place]) 04/02/2013   Discoloration of skin 04/02/2013    Goals Addressed   None    Made an outbound call to Dr. Adah Salvage office to request the  following medications to be sent to [facility]. Levothyroxine 150 mcg insulin degludec (TRESIBA) 100 UNIT/ML FlexTouch Pen Pen Needles 31G x 68m [employer]and strips Spoke with [facility] [place] gathered information necessary for medication transfer. Unable to obtain Insulin Aspart FlexPen 100 UNIT/ML SOPN (Novolog). Dr. JRonnald Rampdoes not prescribe this medication  Requested the following medications from Dr. SErnie Avenamedical assistant: Levocetirizine 5 mg  Metoprolol Succinate 25 mg  Allopurinol 100 mg  Oxycodone 5 mg  Methocarbamol 500 mg   Requested the following medications from Dr. KWendy Poetoffice to be sent to [facility]. Spoke with LDenny Peonwho stated she would forward the information to his nurse for fulfillment. Ferosemide 20 mg Losartan 25 mg  Unable to make contact with Dr. GSteve Rattleroffice. The number listed [phone] not work.  Made outbound call to Dr.  Donzetta Matters and Dr. Nicole Cella office to request Clopidogrel 75 mg  and Atorvastatin 10 mg medications to be sent to [facility]. I was transferred to a confidential voicemail. Left a message requesting medication mentioned.  Contact the patient to confirm her last fill dates of current medications and the timing to which she takes them. [place].  Patient returned my phone call 01-14-2020 at 11:30 am. Stated she was home and had her medication available to review. She located the medication she has not been able to take since July 2021, Spironolactone 100 mg tabs. Reviewed her medication and confirmed quantity as followed: spironolactone (ALDACTONE) 100 MG tablet 17 remaining levocetirizine (XYZAL) 5 MG tablet 12 remaining atorvastatin (LIPITOR) 10 MG tablet 17 remaining clopidogrel (PLAVIX) 75 MG tablet 25 remaining metoprolol succinate (TOPROL-XL) 25 MG 24 hr tablet 83 remaining thinks she has other medication mixed in. Stated some of them are a different shape. RABEprazole (ACIPHEX) 20 MG tablet 34  remaining allopurinol (ZYLOPRIM) 100 MG tablet 46 remaining losartan (COZAAR) 25 MG tablet 37 remaining furosemide (LASIX) 20 MG tablet 73 remaining  Contacted the [place] location for Gastroenterologist. The [facility] location where Dr. Lyndel Safe is located does not have a direct telephone number. Spoke with Wyatt Portela at the [facility] office to request the following medications be sent to [facility]. She stated she would forward my request: Rabeprazole 20 mg Spironolactone 100 mg  The medications from Dr. Adah Salvage office have not been received by [facility] yet. Made outbound call to check the status of those. Requested medications  Follow-Up:  Pharmacist Review   Fanny Skates, [place] Pharmacist Assistant [phone]  Reviewed by: De Blanch, PharmD Clinical Pharmacist [place] [facility] at [facility] [phone]

## 2020-01-10 NOTE — Progress Notes (Signed)
Subjective: Lovey Newcomer presents the office today for follow up evaluation of an ulceration to the medial aspect the right big toe.  She states the toe is making good progress and she is happy with the way it looks.  They have been applying Medihoney to the wound.  Denies injury pus or swelling or redness.  She has no other concerns today. Currently denies any fevers, chills, nausea, vomiting.  No calf pain, chest pain, shortness of breath.   Objective: AAO x3, NAD  On the medial aspect of the hallux there is an ulceration which is more more superficial today measuring 0.7 x 0.5 cm.  There is no probing, undermining or tunneling.  No surrounding erythema, ascending cellulitis there is no fluctuation crepitation there is no malodor. Hyperkeratotic lesion of the heel without any underlying ulceration drainage or signs of infection.  Assessment: 69 year old female with ulceration right foot; PAD  Plan: -All treatment options discussed with the patient including all alternatives, risks, complications -Sharply debrided the wound today to remove nonviable hyperkeratotic tissue utilizing the 312 with scalpel down to healthy, bleeding, viable granulation tissue from wound healing.  Wound continue Medihoney dressing changes every other day we will call [facility] to given an updated order [phone]). -Debride the hyperkeratotic tissue with any complications or bleeding.  Continue moisturizer.  Trula Slade DPM

## 2020-01-14 ENCOUNTER — Telehealth: Payer: Self-pay | Admitting: Gastroenterology

## 2020-01-14 MED ORDER — RABEPRAZOLE SODIUM 20 MG PO TBEC: 20.0000 mg | DELAYED_RELEASE_TABLET | Freq: Every day | ORAL | 6 refills | Status: AC

## 2020-01-14 MED ORDER — SPIRONOLACTONE 100 MG PO TABS
100.0000 mg | ORAL_TABLET | Freq: Every day | ORAL | 6 refills | Status: DC
Start: 2020-01-14 — End: 2020-05-28

## 2020-01-14 NOTE — Telephone Encounter (Signed)
I have sent prescription to patients pharmacy.

## 2020-01-14 NOTE — Telephone Encounter (Signed)
Ivy from PCP office called on behalf of patient to request refills for spironolactone 100 MG tablet and ACIPHEX 20 MG tablet. Please send them to [facility] in [place].

## 2020-01-15 DIAGNOSIS — E1151 Type 2 diabetes mellitus with diabetic peripheral angiopathy without gangrene: Secondary | ICD-10-CM | POA: Diagnosis not present

## 2020-01-15 DIAGNOSIS — M62262 Nontraumatic ischemic infarction of muscle, left lower leg: Secondary | ICD-10-CM | POA: Diagnosis not present

## 2020-01-15 DIAGNOSIS — N179 Acute kidney failure, unspecified: Secondary | ICD-10-CM | POA: Diagnosis not present

## 2020-01-15 DIAGNOSIS — I43 Cardiomyopathy in diseases classified elsewhere: Secondary | ICD-10-CM | POA: Diagnosis not present

## 2020-01-15 DIAGNOSIS — I82412 Acute embolism and thrombosis of left femoral vein: Secondary | ICD-10-CM | POA: Diagnosis not present

## 2020-01-15 DIAGNOSIS — Z992 Dependence on renal dialysis: Secondary | ICD-10-CM | POA: Diagnosis not present

## 2020-01-15 DIAGNOSIS — I82502 Chronic embolism and thrombosis of unspecified deep veins of left lower extremity: Secondary | ICD-10-CM | POA: Diagnosis not present

## 2020-01-15 DIAGNOSIS — J189 Pneumonia, unspecified organism: Secondary | ICD-10-CM | POA: Diagnosis not present

## 2020-01-15 DIAGNOSIS — M6281 Muscle weakness (generalized): Secondary | ICD-10-CM | POA: Diagnosis not present

## 2020-01-15 DIAGNOSIS — D696 Thrombocytopenia, unspecified: Secondary | ICD-10-CM | POA: Diagnosis not present

## 2020-01-15 DIAGNOSIS — E871 Hypo-osmolality and hyponatremia: Secondary | ICD-10-CM | POA: Diagnosis not present

## 2020-01-15 DIAGNOSIS — I110 Hypertensive heart disease with heart failure: Secondary | ICD-10-CM | POA: Diagnosis not present

## 2020-01-15 DIAGNOSIS — D6959 Other secondary thrombocytopenia: Secondary | ICD-10-CM | POA: Diagnosis not present

## 2020-01-15 DIAGNOSIS — N186 End stage renal disease: Secondary | ICD-10-CM | POA: Diagnosis not present

## 2020-01-15 DIAGNOSIS — E119 Type 2 diabetes mellitus without complications: Secondary | ICD-10-CM | POA: Diagnosis not present

## 2020-01-15 DIAGNOSIS — I132 Hypertensive heart and chronic kidney disease with heart failure and with stage 5 chronic kidney disease, or end stage renal disease: Secondary | ICD-10-CM | POA: Diagnosis not present

## 2020-01-15 DIAGNOSIS — N170 Acute kidney failure with tubular necrosis: Secondary | ICD-10-CM | POA: Diagnosis not present

## 2020-01-15 DIAGNOSIS — E1142 Type 2 diabetes mellitus with diabetic polyneuropathy: Secondary | ICD-10-CM | POA: Diagnosis not present

## 2020-01-15 DIAGNOSIS — K766 Portal hypertension: Secondary | ICD-10-CM | POA: Diagnosis not present

## 2020-01-15 DIAGNOSIS — Z87891 Personal history of nicotine dependence: Secondary | ICD-10-CM | POA: Diagnosis not present

## 2020-01-15 DIAGNOSIS — T8141XA Infection following a procedure, superficial incisional surgical site, initial encounter: Secondary | ICD-10-CM | POA: Diagnosis not present

## 2020-01-15 DIAGNOSIS — T8131XA Disruption of external operation (surgical) wound, not elsewhere classified, initial encounter: Secondary | ICD-10-CM | POA: Diagnosis not present

## 2020-01-15 DIAGNOSIS — Z95820 Peripheral vascular angioplasty status with implants and grafts: Secondary | ICD-10-CM | POA: Diagnosis not present

## 2020-01-15 DIAGNOSIS — E1140 Type 2 diabetes mellitus with diabetic neuropathy, unspecified: Secondary | ICD-10-CM | POA: Diagnosis not present

## 2020-01-15 DIAGNOSIS — R918 Other nonspecific abnormal finding of lung field: Secondary | ICD-10-CM | POA: Diagnosis not present

## 2020-01-15 DIAGNOSIS — Z48812 Encounter for surgical aftercare following surgery on the circulatory system: Secondary | ICD-10-CM | POA: Diagnosis not present

## 2020-01-15 DIAGNOSIS — R079 Chest pain, unspecified: Secondary | ICD-10-CM | POA: Diagnosis not present

## 2020-01-15 DIAGNOSIS — M79662 Pain in left lower leg: Secondary | ICD-10-CM | POA: Diagnosis not present

## 2020-01-15 DIAGNOSIS — I700 Atherosclerosis of aorta: Secondary | ICD-10-CM | POA: Diagnosis not present

## 2020-01-15 DIAGNOSIS — I5042 Chronic combined systolic (congestive) and diastolic (congestive) heart failure: Secondary | ICD-10-CM | POA: Diagnosis not present

## 2020-01-15 DIAGNOSIS — B965 Pseudomonas (aeruginosa) (mallei) (pseudomallei) as the cause of diseases classified elsewhere: Secondary | ICD-10-CM | POA: Diagnosis not present

## 2020-01-15 DIAGNOSIS — I4821 Permanent atrial fibrillation: Secondary | ICD-10-CM | POA: Diagnosis not present

## 2020-01-15 DIAGNOSIS — B952 Enterococcus as the cause of diseases classified elsewhere: Secondary | ICD-10-CM | POA: Diagnosis not present

## 2020-01-15 DIAGNOSIS — T8189XD Other complications of procedures, not elsewhere classified, subsequent encounter: Secondary | ICD-10-CM | POA: Diagnosis not present

## 2020-01-15 DIAGNOSIS — K8020 Calculus of gallbladder without cholecystitis without obstruction: Secondary | ICD-10-CM | POA: Diagnosis not present

## 2020-01-15 DIAGNOSIS — R600 Localized edema: Secondary | ICD-10-CM | POA: Diagnosis not present

## 2020-01-15 DIAGNOSIS — M79605 Pain in left leg: Secondary | ICD-10-CM | POA: Diagnosis not present

## 2020-01-15 DIAGNOSIS — I82442 Acute embolism and thrombosis of left tibial vein: Secondary | ICD-10-CM | POA: Diagnosis not present

## 2020-01-15 DIAGNOSIS — K449 Diaphragmatic hernia without obstruction or gangrene: Secondary | ICD-10-CM | POA: Diagnosis not present

## 2020-01-15 DIAGNOSIS — I5032 Chronic diastolic (congestive) heart failure: Secondary | ICD-10-CM | POA: Diagnosis not present

## 2020-01-15 DIAGNOSIS — I998 Other disorder of circulatory system: Secondary | ICD-10-CM | POA: Diagnosis not present

## 2020-01-15 DIAGNOSIS — Z6841 Body Mass Index (BMI) 40.0 and over, adult: Secondary | ICD-10-CM | POA: Diagnosis not present

## 2020-01-15 DIAGNOSIS — I779 Disorder of arteries and arterioles, unspecified: Secondary | ICD-10-CM | POA: Diagnosis not present

## 2020-01-15 DIAGNOSIS — I509 Heart failure, unspecified: Secondary | ICD-10-CM | POA: Diagnosis not present

## 2020-01-15 DIAGNOSIS — T8131XD Disruption of external operation (surgical) wound, not elsewhere classified, subsequent encounter: Secondary | ICD-10-CM | POA: Diagnosis not present

## 2020-01-15 DIAGNOSIS — I701 Atherosclerosis of renal artery: Secondary | ICD-10-CM | POA: Diagnosis not present

## 2020-01-15 DIAGNOSIS — Z951 Presence of aortocoronary bypass graft: Secondary | ICD-10-CM | POA: Diagnosis not present

## 2020-01-15 DIAGNOSIS — E1165 Type 2 diabetes mellitus with hyperglycemia: Secondary | ICD-10-CM | POA: Diagnosis not present

## 2020-01-15 DIAGNOSIS — I743 Embolism and thrombosis of arteries of the lower extremities: Secondary | ICD-10-CM | POA: Diagnosis not present

## 2020-01-15 DIAGNOSIS — I130 Hypertensive heart and chronic kidney disease with heart failure and stage 1 through stage 4 chronic kidney disease, or unspecified chronic kidney disease: Secondary | ICD-10-CM | POA: Diagnosis not present

## 2020-01-15 DIAGNOSIS — L98499 Non-pressure chronic ulcer of skin of other sites with unspecified severity: Secondary | ICD-10-CM | POA: Diagnosis not present

## 2020-01-15 DIAGNOSIS — E1122 Type 2 diabetes mellitus with diabetic chronic kidney disease: Secondary | ICD-10-CM | POA: Diagnosis not present

## 2020-01-15 DIAGNOSIS — I70222 Atherosclerosis of native arteries of extremities with rest pain, left leg: Secondary | ICD-10-CM | POA: Diagnosis not present

## 2020-01-15 DIAGNOSIS — I745 Embolism and thrombosis of iliac artery: Secondary | ICD-10-CM | POA: Diagnosis not present

## 2020-01-15 DIAGNOSIS — M2041 Other hammer toe(s) (acquired), right foot: Secondary | ICD-10-CM | POA: Diagnosis not present

## 2020-01-15 DIAGNOSIS — E1152 Type 2 diabetes mellitus with diabetic peripheral angiopathy with gangrene: Secondary | ICD-10-CM | POA: Diagnosis not present

## 2020-01-15 DIAGNOSIS — I739 Peripheral vascular disease, unspecified: Secondary | ICD-10-CM | POA: Diagnosis not present

## 2020-01-15 DIAGNOSIS — I8510 Secondary esophageal varices without bleeding: Secondary | ICD-10-CM | POA: Diagnosis not present

## 2020-01-15 DIAGNOSIS — R161 Splenomegaly, not elsewhere classified: Secondary | ICD-10-CM | POA: Diagnosis not present

## 2020-01-15 DIAGNOSIS — B9689 Other specified bacterial agents as the cause of diseases classified elsewhere: Secondary | ICD-10-CM | POA: Diagnosis not present

## 2020-01-15 DIAGNOSIS — Z20822 Contact with and (suspected) exposure to covid-19: Secondary | ICD-10-CM | POA: Diagnosis not present

## 2020-01-15 DIAGNOSIS — L98492 Non-pressure chronic ulcer of skin of other sites with fat layer exposed: Secondary | ICD-10-CM | POA: Diagnosis not present

## 2020-01-15 DIAGNOSIS — T8149XA Infection following a procedure, other surgical site, initial encounter: Secondary | ICD-10-CM | POA: Diagnosis not present

## 2020-01-15 DIAGNOSIS — K7460 Unspecified cirrhosis of liver: Secondary | ICD-10-CM | POA: Diagnosis not present

## 2020-01-15 DIAGNOSIS — T8189XA Other complications of procedures, not elsewhere classified, initial encounter: Secondary | ICD-10-CM | POA: Diagnosis not present

## 2020-01-15 DIAGNOSIS — I083 Combined rheumatic disorders of mitral, aortic and tricuspid valves: Secondary | ICD-10-CM | POA: Diagnosis not present

## 2020-01-15 DIAGNOSIS — N189 Chronic kidney disease, unspecified: Secondary | ICD-10-CM | POA: Diagnosis not present

## 2020-01-15 DIAGNOSIS — T8149XD Infection following a procedure, other surgical site, subsequent encounter: Secondary | ICD-10-CM | POA: Diagnosis not present

## 2020-01-16 ENCOUNTER — Other Ambulatory Visit: Payer: Self-pay

## 2020-01-17 ENCOUNTER — Encounter: Payer: Self-pay | Admitting: Podiatry

## 2020-01-17 ENCOUNTER — Ambulatory Visit: Payer: Self-pay

## 2020-01-17 NOTE — Patient Outreach (Signed)
  [facility]) Care Management Chronic Special Needs Program    01/17/2020  Name: Alexis [name], DOB: 1951/01/22  MRN: [id number]   [name] is enrolled in a chronic special needs plan for Diabetes. Notification received that client was admitted to [facility] on 01/15/20 for Circulatory disorder/ leg pain.    PLAN; RNCM will send client's individualized care plan to [facility] medical utilization management department.  RNCM will continue to follow for discharge disposition.  Quinn Plowman RN,BSN,CCM [facility] Care Management [phone]

## 2020-01-18 ENCOUNTER — Telehealth: Payer: Self-pay | Admitting: Pharmacist

## 2020-01-18 NOTE — Progress Notes (Addendum)
Contacted the patient to inquire if she has received her medication elsewhere since we [facility] has not received prescriptions for patient's Novolog, Tresiba, or Pen needles. I was unable to make contact with the patient. Per a message from patient in my chart she is currently admitted to the hospital for a blood clot in her left leg.  It has not been clarified who prescribed the patient's Novolog.  Contacted Dr. Ronnald Ramp office again for status of prescription requests to be sent to [facility]. Spoke with Delilah who indicated the medications were sent to [place]. I requested these medications be rerouted to [facility]. Medications needed: Levothyroxine 150 mcg, insulin degludec, (TRESIBA) 100 UNIT/ML FlexTouch Pen, Pen Needles 31G x 33m, FBorgWarnerand strips. Also inquired about the Novolog prescription. Per Delilah, patient received this medication while in the hospital. There is no specific prescriber associated with the medication. The last fill date for this medication was 01-17-2020.  IFanny Skates CMathenyPharmacist Assistant [phone] Reviewed by: KDe Blanch PharmD Clinical Pharmacist [phone]

## 2020-01-22 ENCOUNTER — Ambulatory Visit: Payer: HMO | Admitting: Podiatry

## 2020-01-28 ENCOUNTER — Other Ambulatory Visit: Payer: Self-pay

## 2020-01-28 NOTE — Patient Outreach (Signed)
  [facility]) Care Management Chronic Special Needs Program    01/28/2020  Name: Alexis [name], DOB: 27-Mar-1950  MRN: [id number]   Ms. Alexis [name] is enrolled in a chronic special needs plan for Diabetes. Unable to contact client. Per chart review client remains hospitalized at this time.   PLAN; RNCM will attempt contact with client in 1 week.   Quinn Plowman RN,BSN,CCM [facility] [phone]

## 2020-01-30 ENCOUNTER — Telehealth: Payer: Self-pay | Admitting: Medical

## 2020-01-30 ENCOUNTER — Ambulatory Visit: Payer: HMO | Admitting: Pharmacist

## 2020-01-30 DIAGNOSIS — E11621 Type 2 diabetes mellitus with foot ulcer: Secondary | ICD-10-CM

## 2020-01-30 DIAGNOSIS — Z794 Long term (current) use of insulin: Secondary | ICD-10-CM

## 2020-01-30 DIAGNOSIS — E785 Hyperlipidemia, unspecified: Secondary | ICD-10-CM

## 2020-01-30 DIAGNOSIS — I10 Essential (primary) hypertension: Secondary | ICD-10-CM

## 2020-01-30 MED ORDER — METHOCARBAMOL 500 MG PO TABS: ORAL_TABLET | ORAL | 0 refills | Status: AC

## 2020-01-30 MED ORDER — OXYCODONE HCL 5 MG PO TABS: ORAL_TABLET | ORAL | 0 refills | Status: DC

## 2020-01-30 NOTE — Telephone Encounter (Signed)
Rx oxycodone and robaxin sent to pt pharmacy.

## 2020-01-30 NOTE — Chronic Care Management (AMB) (Addendum)
Chronic Care Management Pharmacy  Name: Alexis [name]  MRN: [id number] DOB: [birth year]  Chief Complaint/ HPI  Alexis [name],  69 y.o. , female presents for their Follow-Up CCM visit with the clinical pharmacist via telephone due to COVID-19 Pandemic.  PCP : Mackie Pai, PA-C  Their chronic conditions include:  DM, CHF, PVD, CHF, AFib, HTN, Hypothyroid, Osteoarthritis, Gout, HLD  Office Visits: None since last CCM visit on 01/01/20.   Consult Visit: 01/29/20: Incision and drainage of post op wound inection - Left groin wound vac  01/08/20: Podiatry visit w/ Dr. Jacqualyn Posey - Debrided wound. Continue medihoney dressing every other Cleotha Tsang.   01/02/20: Endo visit w/ Peri Jefferson, PA-C - A1c 7.2% down from 7.4%. Continue Tresiba 14 units daily and Humalog TID per scale: BG < 200: 0 units BG 201-250: 2 units BG 251-300: 4 units BG 301-350: 6 units BG 351-400: 8 units BG 401-450: 10 units BG > 451: 12 units Will attempt to get patient Libre 2 CGM.    Medications: Outpatient Encounter Medications as of 01/30/2020  Medication Sig Note   acetaminophen (TYLENOL) 500 MG tablet Take by mouth.    albuterol (VENTOLIN HFA) 108 (90 Base) MCG/ACT inhaler Inhale 2 puffs into the lungs every 6 (six) hours as needed for wheezing or shortness of breath.    allopurinol (ZYLOPRIM) 100 MG tablet Take 1 tablet (100 mg total) by mouth 2 (two) times daily.    AMBULATORY NON FORMULARY MEDICATION Motorized scooter.  Diagnosis: Osteoarthritis M19.90 06/05/2019: Reports her scooter and has some mechanical issues and a technician is scheduled to come and look at it this week.   atorvastatin (LIPITOR) 10 MG tablet Take 1 tablet (10 mg total) by mouth daily.    cephALEXin (KEFLEX) 500 MG capsule Take 1 capsule (500 mg total) by mouth 3 (three) times daily.    cephALEXin (KEFLEX) 500 MG capsule Take 1 capsule (500 mg total) by mouth 3 (three) times daily.    clopidogrel (PLAVIX) 75 MG tablet Take 1 tablet (75  mg total) by mouth daily.    Continuous Blood Gluc Receiver (FREESTYLE LIBRE 14 Hawley Pavia READER) DEVI Use as directed to monitor BG.  Dx E11.65    Continuous Blood Gluc Sensor (FREESTYLE LIBRE 14 Ladajah Soltys SENSOR) MISC INJECT INTO THE SKIN EVERY 14 DAYS    doxycycline (VIBRA-TABS) 100 MG tablet Take 1 tablet (100 mg total) by mouth 2 (two) times daily.    fluticasone (FLONASE) 50 MCG/ACT nasal spray Place 2 sprays into both nostrils at bedtime. (Patient taking differently: Place 2 sprays into both nostrils in the morning and at bedtime. )    furosemide (LASIX) 20 MG tablet Take two tablets by mouth in the morning and one tablet by mouth at noon.    gabapentin (NEURONTIN) 300 MG capsule Take 300 mg by mouth 3 (three) times daily. (Patient not taking: Reported on 01/01/2020) 01/01/2020: Developed tremors   Insulin Aspart FlexPen 100 UNIT/ML SOPN Inject 0-10 Units into the skin daily as needed (If glucose if over 200). Sliding scale    insulin degludec (TRESIBA) 100 UNIT/ML FlexTouch Pen Inject 14 Units into the skin daily.  11/06/2019: Client state she takes 20 units daily   Insulin Pen Needle (PEN NEEDLES 31GX5/16") 31G X 8 MM MISC Use as needed 4 times/[name].  Dx E11.65    levocetirizine (XYZAL) 5 MG tablet Take 1 tablet (5 mg total) by mouth every evening.    levofloxacin (LEVAQUIN) 500 MG tablet Take 1 tablet (500  mg total) by mouth daily.    levothyroxine (SYNTHROID) 150 MCG tablet Take 150 mcg by mouth daily before breakfast.     lidocaine (XYLOCAINE) 4 % external solution Apply topically.    losartan (COZAAR) 25 MG tablet Take 1 tablet (25 mg total) by mouth daily.    Melatonin 10 MG TABS Take 20 mg by mouth at bedtime.     methocarbamol (ROBAXIN) 500 MG tablet 1 tab po bid    metoprolol succinate (TOPROL-XL) 25 MG 24 hr tablet TAKE ONE (1) TABLET BY MOUTH EVERY [name]    ondansetron (ZOFRAN-ODT) 4 MG disintegrating tablet Take 4 mg by mouth every 8 (eight) hours as needed for nausea or vomiting.     ONETOUCH  VERIO test strip 3 (three) times daily.    oxyCODONE (OXY IR/ROXICODONE) 5 MG immediate release tablet TAKE ONE (1) TABLET BY MOUTH EVERY [name] AS NEEDED FOR SEVERE PAIN    Prenatal Vit-Fe Fumarate-FA (MULTIVITAMIN-PRENATAL) 27-0.8 MG TABS tablet Take 1 tablet by mouth daily.     Probiotic CAPS Take 2 capsules by mouth daily. 50 billion (gummy)    RABEprazole (ACIPHEX) 20 MG tablet Take 1 tablet (20 mg total) by mouth daily.    sertraline (ZOLOFT) 50 MG tablet Take 1 tablet (50 mg total) by mouth daily. (Patient not taking: Reported on 01/01/2020)    spironolactone (ALDACTONE) 100 MG tablet Take 1 tablet (100 mg total) by mouth daily.    triamcinolone ointment (KENALOG) 0.1 % Apply to    No facility-administered encounter medications on file as of 01/30/2020.   Immunization History  Administered Date(s) Administered   Fluad Quad(high Dose 65+) 11/08/2018   Hepatitis B, adult 01/31/2019   Hepatitis B, ped/adol 03/19/2019, 03/19/2019, 08/14/2019, 08/14/2019   Influenza Split 01/05/2007, 12/23/2010, 01/03/2012, 05/11/2012   Influenza, High Dose Seasonal PF 01/09/2015, 12/24/2015, 01/14/2017, 12/12/2017   Influenza,inj,Quad PF,6+ Mos 12/24/2013, 01/09/2015   Influenza-Unspecified 01/05/2007, 12/23/2010, 01/03/2012, 05/11/2012, 01/09/2015   PFIZER SARS-COV-2 Vaccination 06/16/2019, 07/09/2019   Pneumococcal Conjugate-13 01/09/2015   Pneumococcal Polysaccharide-23 05/11/2012   Td 12/23/2010   Tdap 12/23/2010     Current Diagnosis/Assessment:  Goals Addressed             This Visit's Progress    Chronic Care Management Pharmacy Care Plan       CARE PLAN ENTRY (see longitudinal plan of care for additional care plan information)  Current Barriers:  Chronic Disease Management support, education, and care coordination needs related to Diabetes, Heart Failure, Peripheral Vascular Disease, AFib, Hypertension, Hypothyroidism, Osteoarthritis, Gout, Hyperlipidemia   Hypertension BP Readings  from Last 3 Encounters:  12/24/19 128/60  10/05/19 124/72  09/18/19 130/72   Pharmacist Clinical Goal(s): Over the next 90 days, patient will work with PharmD and providers to maintain BP goal <130/80 Current regimen:  Losartan 35m daily Metoprolol Succinate 275mdaily  Spironolactone 2566maily Interventions: Discussed BP goal Requested patient to check BP 2-3 times per week and record Patient self care activities - Over the next 90 days, patient will: Check BP 2-3 times per week, document, and provide at future appointments Ensure daily salt intake < 2300 mg/Virgina Deakins  Hyperlipidemia Lab Results  Component Value Date/Time   LDLCALC 33 05/10/2018 09:44 AM   LDLDIRECT 111.0 04/10/2015 10:40 AM   Pharmacist Clinical Goal(s): Over the next 90 days, patient will work with PharmD and providers to maintain LDL goal < 70 Current regimen:  Atorvastatin 26m102mily  Patient self care activities - Over the next 90 days, patient  will: Maintain cholesterol medication regimen.   Diabetes Lab Results  Component Value Date/Time   HGBA1C 7.7 (H) 05/26/2019 04:49 PM   HGBA1C 9.0 04/03/2015 12:00 AM   Pharmacist Clinical Goal(s): Over the next 90 days, patient will work with PharmD and providers to achieve A1c goal <7% Current regimen:  Tresiba 16 units daily Novolog 4-10 units before meals Interventions: Discussed DM goals Patient self care activities - Over the next 90 days, patient will: Check blood sugar once daily, document, and provide at future appointments Contact provider with any episodes of hypoglycemia  Heart Failure Pharmacist Clinical Goal(s) Over the next 90 days, patient will work with PharmD and providers to reduce symptoms associated with heart failure Current regimen:  Furosemide 80m 2AM and 1 PM Losartan 276mdaily AM Metoprolol succinate 2563maily AM Spironolactone 54m1mily Interventions: Discussed importance of weighing daily Recommended patient to start  increased furosemide dose as recommended per cardio Recommended patient get prescription of spironolactone Patient self care activities - Over the next 90 days, patient will: Maintain heart failure medication regimen  Medication management Pharmacist Clinical Goal(s): Over the next 90 days, patient will work with PharmD and providers to achieve optimal medication adherence Current pharmacy: [facility] Interventions Comprehensive medication review performed. Utilize [facility] for medication synchronization, packaging and delivery Patient self care activities - Over the next 90 days, patient will: Focus on medication adherence by filling and taking medications appropriately  Take medications as prescribed Report any questions or concerns to PharmD and/or provider(s)  Please see past updates related to this goal by clicking on the "Past Updates" button in the selected goal         Patient is very confused about medication regimen.  Significant time spent addressing DM as that is the most pressing issue for the patient noting her diabetic foot infections and a1c >/= 8%.   Emergency Bypass surgery 09/2019  Has two grandkids (girl ([id number] old and boy)  Update 01/30/20 Patient currently still hospitalized. She is unsure when she will be discharged home. Strongly encouraged her to give me a call when she is discharged to assure she has the proper medications delivered.    Diabetes   Followed by Endo (Autumn Jones, PA-C)  A1c goal <7%   Problem Story During initial CCM visit in 04/2019 patient was only on bolus insulin per sliding scale with a1c 9% or greater. Coordinated care for patient to be seen by endo to start a basal/bolus regimen or pursue V-go insulin pump patient used in past. Patient was eventually started on basal/bolus regimen.  A1c has improved, but still above goal.   Recent Relevant Labs: Lab Results  Component Value Date/Time   HGBA1C 7.7 (H) 05/26/2019  04:49 PM   HGBA1C 9.0 04/03/2015 12:00 AM   GFR 57.41 (L) 05/23/2019 01:38 PM   GFR 52.36 (L) 04/12/2019 08:47 AM   MICROALBUR 0.3 04/10/2015 10:40 AM    Last diabetic Eye exam:  Lab Results  Component Value Date/Time   HMDIABEYEEXA No Retinopathy 04/13/2017 12:00 AM    Last diabetic Foot exam: No results found for: HMDIABFOOTEX   Checking BG: Daily with Freestyle Libre  Patient is currently uncontrolled on the following medications:  Tresiba 14 units daily Novolog 4-10 units before meals   Last diabetic Eye exam:  Lab Results  Component Value Date/Time   HMDIABEYEEXA No Retinopathy 04/13/2017 12:00 AM    Last diabetic Foot exam: No results found for: HMDIABFOOTEX   We discussed:  Importance of  having a basal/bolus regimen or getting set up with V-go insulin pump to gain better DM control    Update 01/01/20 No specific readings to report.   Reports Novolog Scale to be the following 200: 0 units 201-250: 2 units 251-300: 4 units 301-350: 6 units 351-400: 8 units 401-450: 10 units  Update 01/30/20 No specific readings to report as patient is still in hospital. Not her a1c is now 7.2% as of 01/02/20.  Will continue to follow.  Plan -Continue current medications -Follow up with Autumn Jones  Heart Failure   Type: Combined Systolic and Diastolic  Last ejection fraction: 09/2018 40%  Patient has failed these meds in past: ramipril (low bp, renal function fluctuation?) Patient is currently on the following medications:  Furosemide 42m 2AM and 1 PM Losartan 222mdaily AM Metoprolol succinate 2571maily AM Spironolactone 82m60mily (Doesn't have at home currently)  Gained 20lbs over two days about 2-3 weekends ago. Resolved after doing usual regimen and being more strict about her diet.   She has developed 10lb weight gain again and was told to start regimen of furosemide 40mg6mce daily Encouraged patient to start regiment of increased furosemide and encouraged  her to receive refill of spironolactone  Update 01/30/20 Need to follow closely to assure patient has necessary medications and to prevent readmission.   Plan -Continue current medications  Hypertension   BP goal is:  <130/80  Office blood pressures are  BP Readings from Last 3 Encounters:  12/24/19 128/60  10/05/19 124/72  09/18/19 130/72   Patient checks BP at home infrequently Patient home BP readings are ranging: Unable to assess  Patient has failed these meds in the past: ramipril (low bp, renal function fluctuation?) Patient is currently controlled on the following medications:  Losartan 25mg 57my Metoprolol Succinate 25mg d36m  Spironolactone 25mg da77m(does not currently have)  We discussed  BP goal    Update 01/30/20 No specific readings to provide. Will continue to monitor  Plan -Check BP 2-3 times per week and record -Continue current medications    Hyperlipidemia   LDL goal < 70  Last lipids Lab Results  Component Value Date   CHOL 93 (L) 05/10/2018   HDL 31 (L) 05/10/2018   LDLCALC 33 05/10/2018   LDLDIRECT 111.0 04/10/2015   TRIG 147 05/10/2018   CHOLHDL 3.0 05/10/2018   Hepatic Function Latest Ref Rng & Units 09/18/2019 05/25/2019 05/23/2019  Total Protein 6.0 - 8.5 g/dL 6.9 7.4 7.4  Albumin 3.8 - 4.8 g/dL 3.7(L) 3.7 4.0  AST 0 - 40 IU/L 47(H) 163(H) 78(H)  ALT 0 - 32 IU/L 27 135(H) 69(H)  Alk Phosphatase 48 - 121 IU/L 297(H) 218(H) 256(H)  Total Bilirubin 0.0 - 1.2 mg/dL 0.9 1.1 0.9  Bilirubin, Direct 0.00 - 0.40 mg/dL - - -     The ASCVD Risk score (Goff DCMikey Bussing et al., 2013) failed to calculate for the following reasons:   The patient has a prior MI or stroke diagnosis   Patient has failed these meds in past: None noted  Patient is currently controlled on the following medications:  Atorvastatin 10mg dai33mM   Update 01/30/20 No complaints with regimen. Will assure adherence.  Plan -Continue current  medications   Hypothyroidism   Followed by Endo (Autumn JPeri JeffersonLab Results  Component Value Date/Time   TSH 2.09 03/15/2017 12:05 PM   TSH 15.50 (H) 12/16/2016 10:06 AM   FREET4 0.79 04/06/2016 01:57 AM  Patient has failed these meds in past: None noted  Patient is currently controlled on the following medications:  Levothyroxine 133mg daily  Update 01/30/20 Would benefit from updated TSH?  Plan -Continue current medications   Depression   Will assess regimen and medication options further at next visit Significant time spent on other portions of the visit.   [facility]  Decreased Interest 3  Down, Depressed, Hopeless 0  PHQ - 2 Score 3  Altered sleeping 3  Tired, decreased energy 0  Change in appetite 0  Feeling bad or failure about yourself  0  Trouble concentrating 3  Moving slowly or fidgety/restless 0  Suicidal thoughts 0  PHQ-9 Score 9  Some recent data might be hidden    Patient has failed these meds in past: amitriptyline, venlafaxine, desvenlafaxine, buspirone (listed in D/C meds. No apparent reason for D/C), duloxetine (intoleration) Patient is currently uncontrolled on the following medications:  Sertraline 536mdaily (prescribed, but not taking)  Update 01/30/20 Will assess when patient is home  Plan -Discuss options at next visit   GERD   Patient has failed these meds in past: None noted  Patient is currently controlled on the following medications:  Rabeprazole 2079mwice daily  Breakthrough Sx: No  States new GI was told her she needed to get an EGD, but she is unsure if would like to complete this. States she has to get GI banding every 2 months  Pt states she does not have this medication at home. Noting GI history encouraged patient to get his refilled.  Update 01/30/20 Need to assure patient has received refill when home.   Plan -Obtain refill of rabeprazole   Medication Management   [facility]  Patient admits keeping up with her medications is confusing and would appreciate adherence packaging, synchronization, and delivery.  We discussed: Verbal consent obtained for [facility] enhanced pharmacy services (medication synchronization, adherence packaging, delivery coordination). A medication sync plan was created to allow patient to get all medications delivered once every 30 to 90 days per patient preference. Patient understands they have freedom to choose pharmacy and clinical pharmacist will coordinate care between all prescribers and [facility].  Update 01/30/20 Will need to follow up when patient is discharged home.   Plan  Utilize [facility] for medication synchronization, packaging and delivery   Follow up:  1 week phone visit  Miscellaneous Meds Prenatal vitamin Metamucil  Ondansetron Mupirocin  lidocaine Dicloxacillin  KanMelvenia Beamy, PharmD, BCACP Clinical Pharmacist [phone] have collaborated with the care management provider regarding care management and care coordination activities outlined in this encounter and have reviewed this encounter including documentation in the note and care plan. I am certifying that I agree with the content of this note and encounter as supervising provider.  EdwMackie PaiA-C

## 2020-01-30 NOTE — Patient Instructions (Signed)
Visit Information  Goals Addressed            This Visit's Progress   . Chronic Care Management Pharmacy Care Plan       CARE PLAN ENTRY (see longitudinal plan of care for additional care plan information)  Current Barriers:  . Chronic Disease Management support, education, and care coordination needs related to Diabetes, Heart Failure, Peripheral Vascular Disease, AFib, Hypertension, Hypothyroidism, Osteoarthritis, Gout, Hyperlipidemia   Hypertension BP Readings from Last 3 Encounters:  12/24/19 128/60  10/05/19 124/72  09/18/19 130/72   . Pharmacist Clinical Goal(s): o Over the next 90 days, patient will work with PharmD and providers to maintain BP goal <130/80 . Current regimen:  . Losartan 89m daily . Metoprolol Succinate 253mdaily  . Spironolactone 2523maily . Interventions: o Discussed BP goal o Requested patient to check BP 2-3 times per week and record . Patient self care activities - Over the next 90 days, patient will: o Check BP 2-3 times per week, document, and provide at future appointments o Ensure daily salt intake < 2300 mg/[name]  Hyperlipidemia Lab Results  Component Value Date/Time   LDLCALC 33 05/10/2018 09:44 AM   LDLDIRECT 111.0 04/10/2015 10:40 AM   . Pharmacist Clinical Goal(s): o Over the next 90 days, patient will work with PharmD and providers to maintain LDL goal < 70 . Current regimen:  o Atorvastatin 49m5mily  . Patient self care activities - Over the next 90 days, patient will: o Maintain cholesterol medication regimen.   Diabetes Lab Results  Component Value Date/Time   HGBA1C 7.7 (H) 05/26/2019 04:49 PM   HGBA1C 9.0 04/03/2015 12:00 AM   . Pharmacist Clinical Goal(s): o Over the next 90 days, patient will work with PharmD and providers to achieve A1c goal <7% . Current regimen:   Tresiba 16 units daily  Novolog 4-10 units before meals . Interventions: o Discussed DM goals . Patient self care activities - Over the next  90 days, patient will: o Check blood sugar once daily, document, and provide at future appointments o Contact provider with any episodes of hypoglycemia  Heart Failure . Pharmacist Clinical Goal(s) o Over the next 90 days, patient will work with PharmD and providers to reduce symptoms associated with heart failure . Current regimen:   Furosemide 20mg3m and 1 PM  Losartan 25mg 108my AM  Metoprolol succinate 25mg d80m AM  Spironolactone 50mg da78m. Interventions: o Discussed importance of weighing daily o Recommended patient to start increased furosemide dose as recommended per cardio o Recommended patient get prescription of spironolactone . Patient self care activities - Over the next 90 days, patient will: o Maintain heart failure medication regimen  Medication management . Pharmacist Clinical Goal(s): o Over the next 90 days, patient will work with PharmD and providers to achieve optimal medication adherence . Current pharmacy: [facility] . Interventions o Comprehensive medication review performed. o Utilize [facility] for medication synchronization, packaging and delivery . Patient self care activities - Over the next 90 days, patient will: o Focus on medication adherence by filling and taking medications appropriately  o Take medications as prescribed o Report any questions or concerns to PharmD and/or provider(s)  Please see past updates related to this goal by clicking on the "Past Updates" button in the selected goal         The patient verbalized understanding of instructions, educational materials, and care plan provided today and declined offer to receive copy of patient instructions, educational  materials, and care plan.   Telephone follow up appointment with pharmacy team member scheduled for: 02/06/2020  Alexis [name], PharmD, BCACP Clinical Pharmacist [facility] [phone]

## 2020-02-01 ENCOUNTER — Other Ambulatory Visit: Payer: Self-pay

## 2020-02-01 DIAGNOSIS — I10 Essential (primary) hypertension: Secondary | ICD-10-CM | POA: Diagnosis not present

## 2020-02-01 DIAGNOSIS — T8149XD Infection following a procedure, other surgical site, subsequent encounter: Secondary | ICD-10-CM | POA: Diagnosis not present

## 2020-02-01 DIAGNOSIS — K219 Gastro-esophageal reflux disease without esophagitis: Secondary | ICD-10-CM | POA: Diagnosis not present

## 2020-02-01 DIAGNOSIS — Z452 Encounter for adjustment and management of vascular access device: Secondary | ICD-10-CM | POA: Diagnosis not present

## 2020-02-01 DIAGNOSIS — E1140 Type 2 diabetes mellitus with diabetic neuropathy, unspecified: Secondary | ICD-10-CM | POA: Diagnosis not present

## 2020-02-01 DIAGNOSIS — B965 Pseudomonas (aeruginosa) (mallei) (pseudomallei) as the cause of diseases classified elsewhere: Secondary | ICD-10-CM | POA: Diagnosis not present

## 2020-02-01 DIAGNOSIS — Z48812 Encounter for surgical aftercare following surgery on the circulatory system: Secondary | ICD-10-CM | POA: Diagnosis not present

## 2020-02-01 DIAGNOSIS — I5032 Chronic diastolic (congestive) heart failure: Secondary | ICD-10-CM | POA: Diagnosis not present

## 2020-02-01 DIAGNOSIS — N179 Acute kidney failure, unspecified: Secondary | ICD-10-CM | POA: Diagnosis not present

## 2020-02-01 DIAGNOSIS — M79605 Pain in left leg: Secondary | ICD-10-CM | POA: Diagnosis not present

## 2020-02-01 DIAGNOSIS — M62262 Nontraumatic ischemic infarction of muscle, left lower leg: Secondary | ICD-10-CM | POA: Diagnosis not present

## 2020-02-01 DIAGNOSIS — B9689 Other specified bacterial agents as the cause of diseases classified elsewhere: Secondary | ICD-10-CM | POA: Diagnosis not present

## 2020-02-01 DIAGNOSIS — I739 Peripheral vascular disease, unspecified: Secondary | ICD-10-CM | POA: Diagnosis not present

## 2020-02-01 DIAGNOSIS — D631 Anemia in chronic kidney disease: Secondary | ICD-10-CM | POA: Diagnosis not present

## 2020-02-01 DIAGNOSIS — N2581 Secondary hyperparathyroidism of renal origin: Secondary | ICD-10-CM | POA: Diagnosis not present

## 2020-02-01 DIAGNOSIS — T8131XD Disruption of external operation (surgical) wound, not elsewhere classified, subsequent encounter: Secondary | ICD-10-CM | POA: Diagnosis not present

## 2020-02-01 DIAGNOSIS — D509 Iron deficiency anemia, unspecified: Secondary | ICD-10-CM | POA: Diagnosis not present

## 2020-02-01 DIAGNOSIS — Z992 Dependence on renal dialysis: Secondary | ICD-10-CM | POA: Diagnosis not present

## 2020-02-01 DIAGNOSIS — Z951 Presence of aortocoronary bypass graft: Secondary | ICD-10-CM | POA: Diagnosis not present

## 2020-02-01 DIAGNOSIS — M6281 Muscle weakness (generalized): Secondary | ICD-10-CM | POA: Diagnosis not present

## 2020-02-01 DIAGNOSIS — B952 Enterococcus as the cause of diseases classified elsewhere: Secondary | ICD-10-CM | POA: Diagnosis not present

## 2020-02-01 DIAGNOSIS — I998 Other disorder of circulatory system: Secondary | ICD-10-CM | POA: Diagnosis not present

## 2020-02-01 DIAGNOSIS — Z95820 Peripheral vascular angioplasty status with implants and grafts: Secondary | ICD-10-CM | POA: Diagnosis not present

## 2020-02-01 DIAGNOSIS — I4891 Unspecified atrial fibrillation: Secondary | ICD-10-CM | POA: Diagnosis not present

## 2020-02-01 DIAGNOSIS — T8149XA Infection following a procedure, other surgical site, initial encounter: Secondary | ICD-10-CM | POA: Diagnosis not present

## 2020-02-01 NOTE — Telephone Encounter (Signed)
This encounter was created in error - please disregard.

## 2020-02-01 NOTE — Patient Outreach (Signed)
  [facility]) Care Management Chronic Special Needs Program    02/01/2020  Name: Alexis [name], DOB: 05-24-1950  MRN: [id number]   Ms. Alexis [name] is enrolled in a chronic special needs plan for Diabetes. Chart review indicates client remain hospitalized.   PLAN;;  RNCM will continue to follow   Quinn Plowman RN,BSN,CCM [place] [facility] [phone]

## 2020-02-04 ENCOUNTER — Other Ambulatory Visit: Payer: Self-pay

## 2020-02-04 DIAGNOSIS — D631 Anemia in chronic kidney disease: Secondary | ICD-10-CM | POA: Diagnosis not present

## 2020-02-04 DIAGNOSIS — Z992 Dependence on renal dialysis: Secondary | ICD-10-CM | POA: Diagnosis not present

## 2020-02-04 DIAGNOSIS — N179 Acute kidney failure, unspecified: Secondary | ICD-10-CM | POA: Diagnosis not present

## 2020-02-04 DIAGNOSIS — N2581 Secondary hyperparathyroidism of renal origin: Secondary | ICD-10-CM | POA: Diagnosis not present

## 2020-02-04 DIAGNOSIS — B965 Pseudomonas (aeruginosa) (mallei) (pseudomallei) as the cause of diseases classified elsewhere: Secondary | ICD-10-CM | POA: Diagnosis not present

## 2020-02-04 DIAGNOSIS — D509 Iron deficiency anemia, unspecified: Secondary | ICD-10-CM | POA: Diagnosis not present

## 2020-02-04 NOTE — Patient Outreach (Signed)
[facility]) Care Management Chronic Special Needs Program  02/04/2020  Name: TEQUISHA MAAHS DOB: 09/07/1950  MRN: [id number]  Ms. Seren Chaloux is enrolled in a chronic special needs plan for Diabetes  Unable to complete CSNP assessment follow up with client due to client being hospitalized on 01/15/20 then transferring to [place] skilled nursing facility on 02/01/20.  Individualized care plan updated based on available data.   Client continues to be followed by [facility].   Goals Addressed              This Visit's Progress   .  ":Client states she wants to continue work on getting her A1c down." (pt-stated)   On track     Continue to eat low carbohydrate and low salt meals, watch portion sizes and avoid sugar sweetened drinks.   Continue to take your diabetic medication as prescribed and follow up with your doctor as recommended.  Continue to adhere to diabetes self management actions:  Glucose monitoring per provider recommendation  Check feet daily  Visit provider every 3-6 months as directed  Hbg A1C level every 3-6 months.  Eye Exam yearly  Carbohydrate controlled meal planning  Taking diabetes medication as prescribed by provider  Physical activity      .  A1c goal <7%   On track     Continue to follow diabetes self management actions:  Glucose monitoring per provider recommendation  Visit provider every 3-6 months as directed  Hbg A1C level every 3-6 months.  Carbohydrate controlled meal planning  Taking diabetes medication as prescribed by provider  Physical activity     .  Client verbalize knowledge of Heart Failure disease self management skills by 02/16/19   On track     Continue to follow below recommendations:  Take your medications as prescribed.  Attend doctor appointments Review Congestive heart failure: Action plan in [employer] calendar       .  Client will increase activity tolerance within 3  months   Worsening     Client admitted to the hospital on 01/15/20. Once discharged to home setting: Follow treatment plan and recommendations provided to your by home health therapy team.  Schedule follow up visits with your primary provider and/ or surgeon as recommended.  Continue to take prescribed pain medication as recommended by your doctor.      .  Client will report decrease surgical pain in 1-2 months   Not on track     Client admitted to hospital on 01/15/20. Follow below recommendations once discharged to home:  Continue to take your pain medication as prescribed.  Follow up with your surgeon as recommended.  Notify your surgeon of increase symptoms  Signs/ symptoms of infection from surgical area:  Fever, chills Redness Pus or Drainage Swelling Bad smell coming from the wound Increased pain Hot to touch    .  Client will report ongoing improvement in right foot wounds in 3 months.   Not on track     Client admitted to the hospital 01/15/20 Continue ongoing follow up with home health as recommended by your providers Ongoing follow up with [facility] Report signs / symptoms of infection to your surgeon Schedule follow up appointment with your doctor as recommended        .  COMPLETED: client will report scheduling post hospital follow up appointment with her doctor in 2 months        Per chart review client had follow up  visit with vascular doctor on 11/07/19     .  Client will verbalize reporting infection like symptoms of surgical site to surgeon in 3 months.        Report infection symptoms to your doctor:  Signs/ symptoms of infection from surgical area:  Fever, chills Redness Pus or Drainage Swelling Bad smell coming from the wound Increased pain Hot to touch    .  COMPLETED: General - Client will not be readmitted within 30 days (C-SNP)        Client was not readmitted 30 days after August 2021 hospitalization      .  HEMOGLOBIN A1C < 7        Per  client recent A1c is 7.4 on 08/02/19 Continue to follow below recommendations:  Check blood sugars daily before eating with a goal of 80-130.  You can also check 1 1/2 hours after eating with goal of 180 or less Plan to eat low carbohydrate and low salt meals, watch portion sizes and avoid sugar sweetened drinks.   Review [employer] calendar sent in the mail for diabetes action plan.      .  Obtain annual screen for micro albuminuria (urine) , nephropathy (kidney problems)   Not on track     No recent microalbumin noted in chart Discuss having an annual micro albuminuria screening done at your next follow up visit with your primary care provider.             Plan:  Send successful outreach letter with a copy of their individualized care plan and Send individual care plan to provider  Chronic care management coordinator will outreach in:  3 months RNCM will notify [facility] that client was transferred to skilled nursing facility on 02/01/20. [facility] Management will continue to provide services for this member through 02/29/20.  The [employer] care management team will assume care 03/01/2020.   Quinn Plowman RN,BSN,CCM Chronic Care Management Coordinator [facility] [phone]    .

## 2020-02-04 NOTE — Patient Outreach (Addendum)
  [facility]) Care Management Chronic Special Needs Program    02/04/2020  Name: Alexis [name], DOB: 06-22-1950  MRN: [id number]   Alexis [name] is enrolled in a chronic special needs plan for Diabetes. Client discharged from [facility] medical center and transferred to [place] skilled nursing facility on 02/01/2020.  Client continues to be followed by [facility].   Goals Addressed            This Visit's Progress   . General - Client will not be readmitted within 30 days (C-SNP)       Goal renewed 02/04/2020 Please follow discharge instructions and call provider if you have any questions. Please attend all follow up appointments as scheduled. Please take your medications as prescribed. Please call 24 Hour nurse advice line as needed [phone]).        PLAN;  Care plan sent to primary care provider.  [facility] Management will continue to provide services for this member through 02/29/20.  The [employer] care management team will assume care 03/01/2020.   Quinn Plowman RN,BSN,CCM [facility] Management [phone]

## 2020-02-06 ENCOUNTER — Telehealth: Payer: HMO

## 2020-02-06 DIAGNOSIS — K219 Gastro-esophageal reflux disease without esophagitis: Secondary | ICD-10-CM | POA: Diagnosis not present

## 2020-02-06 DIAGNOSIS — I998 Other disorder of circulatory system: Secondary | ICD-10-CM | POA: Diagnosis not present

## 2020-02-06 DIAGNOSIS — I739 Peripheral vascular disease, unspecified: Secondary | ICD-10-CM | POA: Diagnosis not present

## 2020-02-06 DIAGNOSIS — I4891 Unspecified atrial fibrillation: Secondary | ICD-10-CM | POA: Diagnosis not present

## 2020-02-06 NOTE — Chronic Care Management (AMB) (Deleted)
Chronic Care Management Pharmacy  Name: Alexis [name]  MRN: [id number] DOB: 1950/03/17  Chief Complaint/ HPI  Alexis [name],  69 y.o. , female presents for their Follow-Up CCM visit with the clinical pharmacist via telephone due to COVID-19 Pandemic.  PCP : Mackie Pai, PA-C  Their chronic conditions include:  DM, CHF, PVD, CHF, AFib, HTN, Hypothyroid, Osteoarthritis, Gout, HLD  Office Visits: None since last CCM visit on 01/30/20.   Consult Visit: 01/29/20: Incision and drainage of post op wound inection - Left groin wound vac  01/08/20: Podiatry visit w/ Dr. Jacqualyn Posey - Debrided wound. Continue medihoney dressing every other Alexis [name].   01/02/20: Endo visit w/ Peri Jefferson, PA-C - A1c 7.2% down from 7.4%. Continue Tresiba 14 units daily and Humalog TID per scale: BG < 200: 0 units BG 201-250: 2 units BG 251-300: 4 units BG 301-350: 6 units BG 351-400: 8 units BG 401-450: 10 units BG > 451: 12 units Will attempt to get patient Libre 2 CGM.    Medications: Outpatient Encounter Medications as of 02/06/2020  Medication Sig Note  . acetaminophen (TYLENOL) 500 MG tablet Take by mouth.   Marland Kitchen albuterol (VENTOLIN HFA) 108 (90 Base) MCG/ACT inhaler Inhale 2 puffs into the lungs every 6 (six) hours as needed for wheezing or shortness of breath.   . allopurinol (ZYLOPRIM) 100 MG tablet Take 1 tablet (100 mg total) by mouth 2 (two) times daily.   . AMBULATORY NON FORMULARY MEDICATION Motorized scooter.  Diagnosis: Osteoarthritis M19.90 06/05/2019: Reports her scooter and has some mechanical issues and a technician is scheduled to come and look at it this week.  Marland Kitchen atorvastatin (LIPITOR) 10 MG tablet Take 1 tablet (10 mg total) by mouth daily.   . cephALEXin (KEFLEX) 500 MG capsule Take 1 capsule (500 mg total) by mouth 3 (three) times daily.   . cephALEXin (KEFLEX) 500 MG capsule Take 1 capsule (500 mg total) by mouth 3 (three) times daily.   . clopidogrel (PLAVIX) 75 MG tablet Take 1  tablet (75 mg total) by mouth daily.   . Continuous Blood Gluc Receiver (FREESTYLE LIBRE 14 Alexis [name] READER) DEVI Use as directed to monitor BG.  Dx E11.65   . Continuous Blood Gluc Sensor (FREESTYLE LIBRE 14 Breck [place] SENSOR) MISC INJECT INTO THE SKIN EVERY 14 DAYS   . doxycycline (VIBRA-TABS) 100 MG tablet Take 1 tablet (100 mg total) by mouth 2 (two) times daily.   . fluticasone (FLONASE) 50 MCG/ACT nasal spray Place 2 sprays into both nostrils at bedtime. (Patient taking differently: Place 2 sprays into both nostrils in the morning and at bedtime. )   . furosemide (LASIX) 20 MG tablet Take two tablets by mouth in the morning and one tablet by mouth at noon.   . gabapentin (NEURONTIN) 300 MG capsule Take 300 mg by mouth 3 (three) times daily. (Patient not taking: Reported on 01/01/2020) 01/01/2020: Developed tremors  . Insulin Aspart FlexPen 100 UNIT/ML SOPN Inject 0-10 Units into the skin daily as needed (If glucose if over 200). Sliding scale   . insulin degludec (TRESIBA) 100 UNIT/ML FlexTouch Pen Inject 14 Units into the skin daily.  11/06/2019: Client state she takes 20 units daily  . Insulin Pen Needle (PEN NEEDLES 31GX5/16") 31G X 8 MM MISC Use as needed 4 times/Alexis [name].  Dx E11.65   . levocetirizine (XYZAL) 5 MG tablet Take 1 tablet (5 mg total) by mouth every evening.   Marland Kitchen levofloxacin (LEVAQUIN) 500 MG tablet Take 1 tablet (500  mg total) by mouth daily.   Marland Kitchen levothyroxine (SYNTHROID) 150 MCG tablet Take 150 mcg by mouth daily before breakfast.    . lidocaine (XYLOCAINE) 4 % external solution Apply topically.   Marland Kitchen losartan (COZAAR) 25 MG tablet Take 1 tablet (25 mg total) by mouth daily.   . Melatonin 10 MG TABS Take 20 mg by mouth at bedtime.    . methocarbamol (ROBAXIN) 500 MG tablet 1 tab po bid   . metoprolol succinate (TOPROL-XL) 25 MG 24 hr tablet TAKE ONE (1) TABLET BY MOUTH EVERY Alexis [name]   . ondansetron (ZOFRAN-ODT) 4 MG disintegrating tablet Take 4 mg by mouth every 8 (eight) hours as needed for nausea  or vomiting.    Alexis [name] VERIO test strip 3 (three) times daily.   Marland Kitchen oxyCODONE (OXY IR/ROXICODONE) 5 MG immediate release tablet TAKE ONE (1) TABLET BY MOUTH EVERY Alexis [name] AS NEEDED FOR SEVERE PAIN   . Prenatal Vit-Fe Fumarate-FA (MULTIVITAMIN-PRENATAL) 27-0.8 MG TABS tablet Take 1 tablet by mouth daily.    . Probiotic CAPS Take 2 capsules by mouth daily. 50 billion (gummy)   . RABEprazole (ACIPHEX) 20 MG tablet Take 1 tablet (20 mg total) by mouth daily.   . sertraline (ZOLOFT) 50 MG tablet Take 1 tablet (50 mg total) by mouth daily. (Patient not taking: Reported on 01/01/2020)   . spironolactone (ALDACTONE) 100 MG tablet Take 1 tablet (100 mg total) by mouth daily.   Marland Kitchen triamcinolone ointment (KENALOG) 0.1 % Apply to    No facility-administered encounter medications on file as of 02/06/2020.   Immunization History  Administered Date(s) Administered  . Fluad Quad(high Dose 65+) 11/08/2018  . Hepatitis B, adult 01/31/2019  . Hepatitis B, ped/adol 03/19/2019, 03/19/2019, 08/14/2019, 08/14/2019  . Influenza Split 01/05/2007, 12/23/2010, 01/03/2012, 05/11/2012  . Influenza, High Dose Seasonal PF 01/09/2015, 12/24/2015, 01/14/2017, 12/12/2017  . Influenza,inj,Quad PF,6+ Mos 12/24/2013, 01/09/2015  . Influenza-Unspecified 01/05/2007, 12/23/2010, 01/03/2012, 05/11/2012, 01/09/2015  . PFIZER SARS-COV-2 Vaccination 06/16/2019, 07/09/2019  . Pneumococcal Conjugate-13 01/09/2015  . Pneumococcal Polysaccharide-23 05/11/2012  . Td 12/23/2010  . Tdap 12/23/2010     Current Diagnosis/Assessment:  Goals Addressed   None    Patient is very confused about medication regimen.  Significant time spent addressing DM as that is the most pressing issue for the patient noting her diabetic foot infections and a1c >/= 8%.   Emergency Bypass surgery 09/2019  Has two grandkids (girl (10years old and boy)  Update 01/30/20 Patient currently still hospitalized. She is unsure when she will be discharged home.  Strongly encouraged her to give me a call when she is discharged to assure she has the proper medications delivered.    Diabetes   Followed by Endo (Autumn Jones, PA-C)  A1c goal <7%   Problem Story During initial CCM visit in 04/2019 patient was only on bolus insulin per sliding scale with a1c 9% or greater. Coordinated care for patient to be seen by endo to start a basal/bolus regimen or pursue V-go insulin pump patient used in past. Patient was eventually started on basal/bolus regimen.  A1c has improved, but still above goal.   Recent Relevant Labs: Lab Results  Component Value Date/Time   HGBA1C 7.7 (H) 05/26/2019 04:49 PM   HGBA1C 9.0 04/03/2015 12:00 AM   GFR 57.41 (L) 05/23/2019 01:38 PM   GFR 52.36 (L) 04/12/2019 08:47 AM   MICROALBUR 0.3 04/10/2015 10:40 AM    Last diabetic Eye exam:  Lab Results  Component Value Date/Time   HMDIABEYEEXA  No Retinopathy 04/13/2017 12:00 AM    Last diabetic Foot exam: No results found for: HMDIABFOOTEX   Checking BG: Daily with Freestyle Libre  Patient is currently uncontrolled on the following medications:   Tresiba 14 units daily  Novolog 4-10 units before meals   Last diabetic Eye exam:  Lab Results  Component Value Date/Time   HMDIABEYEEXA No Retinopathy 04/13/2017 12:00 AM    Last diabetic Foot exam: No results found for: HMDIABFOOTEX   We discussed:  Importance of having a basal/bolus regimen or getting set up with V-go insulin pump to gain better DM control    Update 01/01/20 No specific readings to report.   Reports Novolog Scale to be the following 200: 0 units 201-250: 2 units 251-300: 4 units 301-350: 6 units 351-400: 8 units 401-450: 10 units  Update 01/30/20 No specific readings to report as patient is still in hospital. Note her a1c is now 7.2% as of 01/02/20.  Will continue to follow.  Update 02/06/20  Plan -Continue current medications -Follow up with Autumn Jones  Heart Failure   Type:  Combined Systolic and Diastolic  Last ejection fraction: 09/2018 40%  Patient has failed these meds in past: ramipril (low bp, renal function fluctuation?) Patient is currently on the following medications:   Furosemide 35m 2AM and 1 PM  Losartan 242mdaily AM  Metoprolol succinate 2546maily AM  Spironolactone 21m95mily (Doesn't have at home currently)  Gained 20lbs over two days about 2-3 weekends ago. Resolved after doing usual regimen and being more strict about her diet.   She has developed 10lb weight gain again and was told to start regimen of furosemide 40mg65mce daily Encouraged patient to start regiment of increased furosemide and encouraged her to receive refill of spironolactone  Update 01/30/20 Need to follow closely to assure patient has necessary medications and to prevent readmission.   Update 02/06/20  Plan -Continue current medications  Hypertension   BP goal is:  <130/80  Office blood pressures are  BP Readings from Last 3 Encounters:  12/24/19 128/60  10/05/19 124/72  09/18/19 130/72   Patient checks BP at home infrequently Patient home BP readings are ranging: Unable to assess  Patient has failed these meds in the past: ramipril (low bp, renal function fluctuation?) Patient is currently controlled on the following medications:  . Losartan 25mg 25my . Metoprolol Succinate 25mg d45m  . Spironolactone 25mg da65m(does not currently have)  We discussed  BP goal    Update 01/30/20 No specific readings to provide. Will continue to monitor  Update 02/06/20  Plan -Check BP 2-3 times per week and record -Continue current medications    Hyperlipidemia   LDL goal < 70  Last lipids Lab Results  Component Value Date   CHOL 93 (L) 05/10/2018   HDL 31 (L) 05/10/2018   LDLCALC 33 05/10/2018   LDLDIRECT 111.0 04/10/2015   TRIG 147 05/10/2018   CHOLHDL 3.0 05/10/2018   Hepatic Function Latest Ref Rng & Units 09/18/2019 05/25/2019 05/23/2019   Total Protein 6.0 - 8.5 g/dL 6.9 7.4 7.4  Albumin 3.8 - 4.8 g/dL 3.7(L) 3.7 4.0  AST 0 - 40 IU/L 47(H) 163(H) 78(H)  ALT 0 - 32 IU/L 27 135(H) 69(H)  Alk Phosphatase 48 - 121 IU/L 297(H) 218(H) 256(H)  Total Bilirubin 0.0 - 1.2 mg/dL 0.9 1.1 0.9  Bilirubin, Direct 0.00 - 0.40 mg/dL - - -     The ASCVD Risk score (Goff DCMikey Bussing et Brooke Bonito.,  2013) failed to calculate for the following reasons:   The patient has a prior MI or stroke diagnosis   Patient has failed these meds in past: None noted  Patient is currently controlled on the following medications:  . Atorvastatin 60m daily AM   Update 01/30/20 No complaints with regimen. Will assure adherence.  Update 02/06/20  Plan -Continue current medications   Hypothyroidism   Followed by Endo (Peri Jefferson PA-C)  Lab Results  Component Value Date/Time   TSH 2.09 03/15/2017 12:05 PM   TSH 15.50 (H) 12/16/2016 10:06 AM   FREET4 0.79 04/06/2016 01:57 AM    Patient has failed these meds in past: None noted  Patient is currently controlled on the following medications:  . Levothyroxine 1563m daily  Update 01/30/20 Would benefit from updated TSH?  Update 02/06/20  Plan -Continue current medications   Depression   Will assess regimen and medication options further at next visit Significant time spent on other portions of the visit.   [facility] 01/01/2020  Decreased Interest 3  Down, Depressed, Hopeless 0  PHQ - 2 Score 3  Altered sleeping 3  Tired, decreased energy 0  Change in appetite 0  Feeling bad or failure about yourself  0  Trouble concentrating 3  Moving slowly or fidgety/restless 0  Suicidal thoughts 0  PHQ-9 Score 9  Some recent data might be hidden    Patient has failed these meds in past: amitriptyline, venlafaxine, desvenlafaxine, buspirone (listed in D/C meds. No apparent reason for D/C), duloxetine (intoleration) Patient is currently uncontrolled on the following medications:   . Sertraline 5053maily (prescribed, but not taking)  Update 01/30/20 Will assess when patient is home  Update 02/06/20  Plan -Discuss options at next visit   GERD   Patient has failed these meds in past: None noted  Patient is currently controlled on the following medications:  . Rabeprazole 67m32mice daily  Breakthrough Sx: No  States new GI was told her she needed to get an EGD, but she is unsure if would like to complete this. States she has to get GI banding every 2 months  Pt states she does not have this medication at home. Noting GI history encouraged patient to get his refilled.  Update 01/30/20 Need to assure patient has received refill when home.  Update 02/06/20   Plan -Obtain refill of rabeprazole   Medication Management   Pt uses [facility] for all medications  Patient admits keeping up with her medications is confusing and would appreciate adherence packaging, synchronization, and delivery.  We discussed: Verbal consent obtained for [facility] enhanced pharmacy services (medication synchronization, adherence packaging, delivery coordination). A medication sync plan was created to allow patient to get all medications delivered once every 30 to 90 days per patient preference. Patient understands they have freedom to choose pharmacy and clinical pharmacist will coordinate care between all prescribers and [facility].  Update 01/30/20 Will need to follow up when patient is discharged home.  Update 02/06/20   Plan  Utilize [facility] for medication synchronization, packaging and delivery   Follow up:  1 week phone visit  Miscellaneous Meds Prenatal vitamin Metamucil  Ondansetron Mupirocin  lidocaine Dicloxacillin  KaneDe BlancharmD, BCACP Clinical Pharmacist [phone]

## 2020-02-08 ENCOUNTER — Telehealth: Payer: Self-pay | Admitting: Pharmacist

## 2020-02-08 NOTE — Progress Notes (Addendum)
Chronic Care Management Pharmacy Assistant   Name: Alexis [name]  MRN: [id number] DOB: 03/14/1950  Reason for Encounter: Medication Review   PCP : Mackie Pai, PA-C  Allergies:   Allergies  Allergen Reactions   Indomethacin Other (See Comments)    Renal Insufficiency   Pregabalin Other (See Comments)    DIZZINESS    Sulfa Antibiotics Hives   Clindamycin/Lincomycin    Morphine And Related Nausea And Vomiting    Medications: Outpatient Encounter Medications as of 02/08/2020  Medication Sig Note   acetaminophen (TYLENOL) 500 MG tablet Take by mouth.    albuterol (VENTOLIN HFA) 108 (90 Base) MCG/ACT inhaler Inhale 2 puffs into the lungs every 6 (six) hours as needed for wheezing or shortness of breath.    allopurinol (ZYLOPRIM) 100 MG tablet Take 1 tablet (100 mg total) by mouth 2 (two) times daily.    AMBULATORY NON FORMULARY MEDICATION Motorized scooter.  Diagnosis: Osteoarthritis M19.90 06/05/2019: Reports her scooter and has some mechanical issues and a technician is scheduled to come and look at it this week.   atorvastatin (LIPITOR) 10 MG tablet Take 1 tablet (10 mg total) by mouth daily.    cephALEXin (KEFLEX) 500 MG capsule Take 1 capsule (500 mg total) by mouth 3 (three) times daily.    cephALEXin (KEFLEX) 500 MG capsule Take 1 capsule (500 mg total) by mouth 3 (three) times daily.    clopidogrel (PLAVIX) 75 MG tablet Take 1 tablet (75 mg total) by mouth daily.    Continuous Blood Gluc Receiver (FREESTYLE LIBRE 14 DAY READER) DEVI Use as directed to monitor BG.  Dx E11.65    Continuous Blood Gluc Sensor (FREESTYLE LIBRE 14 DAY SENSOR) MISC INJECT INTO THE SKIN EVERY 14 DAYS    doxycycline (VIBRA-TABS) 100 MG tablet Take 1 tablet (100 mg total) by mouth 2 (two) times daily.    fluticasone (FLONASE) 50 MCG/ACT nasal spray Place 2 sprays into both nostrils at bedtime. (Patient taking differently: Place 2 sprays into both nostrils in the morning and at bedtime. )     furosemide (LASIX) 20 MG tablet Take two tablets by mouth in the morning and one tablet by mouth at noon.    gabapentin (NEURONTIN) 300 MG capsule Take 300 mg by mouth 3 (three) times daily. (Patient not taking: Reported on 01/01/2020) 01/01/2020: Developed tremors   Insulin Aspart FlexPen 100 UNIT/ML SOPN Inject 0-10 Units into the skin daily as needed (If glucose if over 200). Sliding scale    insulin degludec (TRESIBA) 100 UNIT/ML FlexTouch Pen Inject 14 Units into the skin daily.  11/06/2019: Client state she takes 20 units daily   Insulin Pen Needle (PEN NEEDLES 31GX5/16") 31G X 8 MM MISC Use as needed 4 times/day.  Dx E11.65    levocetirizine (XYZAL) 5 MG tablet Take 1 tablet (5 mg total) by mouth every evening.    levofloxacin (LEVAQUIN) 500 MG tablet Take 1 tablet (500 mg total) by mouth daily.    levothyroxine (SYNTHROID) 150 MCG tablet Take 150 mcg by mouth daily before breakfast.     lidocaine (XYLOCAINE) 4 % external solution Apply topically.    losartan (COZAAR) 25 MG tablet Take 1 tablet (25 mg total) by mouth daily.    Melatonin 10 MG TABS Take 20 mg by mouth at bedtime.     methocarbamol (ROBAXIN) 500 MG tablet 1 tab po bid    metoprolol succinate (TOPROL-XL) 25 MG 24 hr tablet TAKE ONE (1) TABLET BY MOUTH EVERY  DAY    ondansetron (ZOFRAN-ODT) 4 MG disintegrating tablet Take 4 mg by mouth every 8 (eight) hours as needed for nausea or vomiting.     ONETOUCH VERIO test strip 3 (three) times daily.    oxyCODONE (OXY IR/ROXICODONE) 5 MG immediate release tablet TAKE ONE (1) TABLET BY MOUTH EVERY DAY AS NEEDED FOR SEVERE PAIN    Prenatal Vit-Fe Fumarate-FA (MULTIVITAMIN-PRENATAL) 27-0.8 MG TABS tablet Take 1 tablet by mouth daily.     Probiotic CAPS Take 2 capsules by mouth daily. 50 billion (gummy)    RABEprazole (ACIPHEX) 20 MG tablet Take 1 tablet (20 mg total) by mouth daily.    sertraline (ZOLOFT) 50 MG tablet Take 1 tablet (50 mg total) by mouth daily. (Patient not taking: Reported on  01/01/2020)    spironolactone (ALDACTONE) 100 MG tablet Take 1 tablet (100 mg total) by mouth daily.    triamcinolone ointment (KENALOG) 0.1 % Apply to    No facility-administered encounter medications on file as of 02/08/2020.    Current Diagnosis: Patient Active Problem List   Diagnosis Date Noted   Depression 05/26/2019   Cellulitis 05/26/2019   Other cirrhosis of liver ([place]) 38/33/3832   Chronic systolic CHF (congestive heart failure) ([place]) 05/26/2019   Cellulitis in diabetic foot ([place]) 05/25/2019   Epigastric pain    Idiopathic esophageal varices without bleeding ([place])    Anemia due to chronic blood loss    Paroxysmal atrial fibrillation ([place]) 09/26/2018   Macrocytic anemia 09/04/2018   Pacemaker 08/25/2018   Insulin dependent diabetes mellitus with complications 91/91/6606   Constipation 08/25/2018   Fluid overload 08/25/2018   Chronic anticoagulation 08/25/2018   Morbid obesity ([place]) 08/25/2018   Chronic diastolic heart failure ([place]) 05/17/2018   Acute bronchitis with COPD ([place]) 01/16/2018   Acquired absence of other right toe(s) ([place]) 11/16/2017   Type 2 diabetes mellitus with foot ulcer ([place]) 11/16/2017   Crush injury of left foot, initial encounter 11/28/2016   Osteomyelitis ([place]) 07/30/2016   Gangrene of foot ([place]) 04/27/2016   Thrombocytopenia ([place])    Vomiting and diarrhea 04/05/2016   Hyperlipidemia 11/18/2015   Gout 08/05/2014   Cough 08/05/2014   Allergic rhinitis 07/12/2014   Osteoarthritis 07/12/2014   Asthma 07/12/2014   Congestive heart failure ([place]) 07/12/2014   Permanent atrial fibrillation ([place]) 07/12/2014   HTN (hypertension) 07/12/2014   Hypothyroidism 07/12/2014   History of substance abuse ([place]) 07/12/2014   Peripheral vascular disease ([place]) 04/02/2013   Discoloration of skin 04/02/2013    Goals Addressed   None    Reviewed chart for medication changes ahead of medication coordination call.  No OVs, Consults, or hospital visits since  last Pharmacist visit.  No medication changes indicated.  BP Readings from Last 3 Encounters:  12/24/19 128/60  10/05/19 124/72  09/18/19 130/72    Lab Results  Component Value Date   HGBA1C 7.7 (H) 05/26/2019     Patient obtains medications through Adherence Packaging  90 Days   Last adherence delivery included:  Atorvastatin 10 mg; one tab with Breakfast (18 ds on 01-30-2020) Clopidogrel 75 mg; one tab with Dinner (18 ds on 01-30-2020) Rabeprazole 20 mg; one tab with Breakfast, one tab with Dinner (18 ds on 01-30-2020) Spironolactone 100 mg; one tab with Breakfast (18 ds on 01-30-2020) Furosemide 20 mg; two tabs with Breakfast, one tab with Lunch (27 ds on 01-21-2020) Losartan 25 mg; one tab with Breakfast (8 ds on 01-30-2020) Levothyroxine 150 mcg; one tab before Breakfast (27 ds  on 01-21-2020) Freestyle Libre (01-30-2020) Levocetirizine 5 mg; one tab with Breakfast (27 ds on 01-21-2020) Metoprolol Succinate 25 mg; one tab with Breakfast Allopurinol 100 mg; one tab with Breakfast, one tab with Dinner (18 ds on 01-30-2020) Oxycodone 5 mg; one tab PRN  (30 ds on 01-21-2020) Methocarbamol 500 mg; one with Breakfast, one tab with Dinner   Patient was unable to receive the following medications last month due to prescriptions not obtained by [facility] by the specialist office: Tyler Aas 100 UNIT/ML FlexTouch Pen; inject 14 units daily Novolog Pen Needles 31G x 51m  Patient is due for next adherence delivery on: 02-15-2020. Called patient and reviewed medications and coordinated delivery. Unable to make contact  Patient needs active prescriptions for Tresiba 100 UNIT/ML FlexTouch Pen,Novolog, and Pen Needles 31G x 868m Called out to Dr. JoAdah Salvageffice on Monday December 13th at [phone] for these prescriptions. Spoke with AlVietnamho stated a note was left in the patient chart indicating the prescriptions were not able to sent to [facility] because the patient was in the hospital  on 01-18-2020. AlVietnamtated she would submit another request for these medications to be forwarded to the patient's current pharmacy.  02-11-2020: Patient returned my call. Left me a voice mail stating she was still in the facility. Per the clinical pharmacist the patient's medication needs are being met in a SNF. She will make contact with the CPP once she is released.   Follow-Up:  Pharmacist Review   IvFanny SkatesCMAcacia Villasharmacist Assistant [phone] by: KaDe BlanchPharmD, BCACP Clinical Pharmacist [phone]

## 2020-02-13 ENCOUNTER — Ambulatory Visit: Payer: Self-pay | Admitting: Pharmacist

## 2020-02-13 ENCOUNTER — Telehealth: Payer: Self-pay

## 2020-02-13 DIAGNOSIS — N179 Acute kidney failure, unspecified: Secondary | ICD-10-CM | POA: Diagnosis not present

## 2020-02-13 NOTE — Progress Notes (Addendum)
Opened for review. Saw note on pt getting dialysis.  Mackie Pai, PA-C

## 2020-02-13 NOTE — Chronic Care Management (AMB) (Signed)
Patient called to confirm she is the [facility] facility.  She states she is currently on dialysis, but was told her kidneys are starting to function.  She states she has about 38lbs of fluid on her and they are going to work to remove the fluid.  Her goal is to be discharged on Saturday.   De Blanch, PharmD, BCACP Clinical Pharmacist [facility] [phone]

## 2020-02-13 NOTE — Telephone Encounter (Signed)
Notified patient to inform her that she was due for a repeat EGD at [facility] with labs on the same day. She reports being at [place] and does not feel well enough to proceed with the procedure at this time. She will contact the office next month to possibly schedule for February. I will also contact patient next month to inquire.

## 2020-02-14 DIAGNOSIS — I739 Peripheral vascular disease, unspecified: Secondary | ICD-10-CM | POA: Diagnosis not present

## 2020-02-14 DIAGNOSIS — I4891 Unspecified atrial fibrillation: Secondary | ICD-10-CM | POA: Diagnosis not present

## 2020-02-14 DIAGNOSIS — N179 Acute kidney failure, unspecified: Secondary | ICD-10-CM | POA: Diagnosis not present

## 2020-02-14 DIAGNOSIS — Z95820 Peripheral vascular angioplasty status with implants and grafts: Secondary | ICD-10-CM | POA: Diagnosis not present

## 2020-02-14 DIAGNOSIS — I998 Other disorder of circulatory system: Secondary | ICD-10-CM | POA: Diagnosis not present

## 2020-02-15 ENCOUNTER — Telehealth: Payer: HMO

## 2020-02-18 ENCOUNTER — Encounter: Payer: Self-pay | Admitting: Medical

## 2020-02-18 ENCOUNTER — Telehealth: Payer: HMO

## 2020-02-18 ENCOUNTER — Telehealth (INDEPENDENT_AMBULATORY_CARE_PROVIDER_SITE_OTHER): Payer: HMO | Admitting: Medical

## 2020-02-18 ENCOUNTER — Other Ambulatory Visit: Payer: Self-pay

## 2020-02-18 ENCOUNTER — Other Ambulatory Visit: Payer: Self-pay | Admitting: Medical

## 2020-02-18 ENCOUNTER — Telehealth: Payer: Self-pay | Admitting: Pharmacist

## 2020-02-18 VITALS — BP 136/64 | HR 56 | Wt 220.0 lb

## 2020-02-18 DIAGNOSIS — E11621 Type 2 diabetes mellitus with foot ulcer: Secondary | ICD-10-CM | POA: Diagnosis not present

## 2020-02-18 DIAGNOSIS — L97509 Non-pressure chronic ulcer of other part of unspecified foot with unspecified severity: Secondary | ICD-10-CM | POA: Diagnosis not present

## 2020-02-18 DIAGNOSIS — I132 Hypertensive heart and chronic kidney disease with heart failure and with stage 5 chronic kidney disease, or end stage renal disease: Secondary | ICD-10-CM | POA: Diagnosis not present

## 2020-02-18 DIAGNOSIS — Z794 Long term (current) use of insulin: Secondary | ICD-10-CM

## 2020-02-18 DIAGNOSIS — I10 Essential (primary) hypertension: Secondary | ICD-10-CM

## 2020-02-18 DIAGNOSIS — I5022 Chronic systolic (congestive) heart failure: Secondary | ICD-10-CM | POA: Diagnosis not present

## 2020-02-18 DIAGNOSIS — M62262 Nontraumatic ischemic infarction of muscle, left lower leg: Secondary | ICD-10-CM | POA: Diagnosis not present

## 2020-02-18 DIAGNOSIS — I5032 Chronic diastolic (congestive) heart failure: Secondary | ICD-10-CM | POA: Diagnosis not present

## 2020-02-18 DIAGNOSIS — N186 End stage renal disease: Secondary | ICD-10-CM | POA: Diagnosis not present

## 2020-02-18 DIAGNOSIS — E1122 Type 2 diabetes mellitus with diabetic chronic kidney disease: Secondary | ICD-10-CM | POA: Diagnosis not present

## 2020-02-18 DIAGNOSIS — T8189XD Other complications of procedures, not elsewhere classified, subsequent encounter: Secondary | ICD-10-CM | POA: Diagnosis not present

## 2020-02-18 DIAGNOSIS — E1140 Type 2 diabetes mellitus with diabetic neuropathy, unspecified: Secondary | ICD-10-CM | POA: Diagnosis not present

## 2020-02-18 DIAGNOSIS — Z4801 Encounter for change or removal of surgical wound dressing: Secondary | ICD-10-CM | POA: Diagnosis not present

## 2020-02-18 DIAGNOSIS — R059 Cough, unspecified: Secondary | ICD-10-CM

## 2020-02-18 DIAGNOSIS — H1033 Unspecified acute conjunctivitis, bilateral: Secondary | ICD-10-CM

## 2020-02-18 DIAGNOSIS — R262 Difficulty in walking, not elsewhere classified: Secondary | ICD-10-CM | POA: Diagnosis not present

## 2020-02-18 MED ORDER — HYDROCODONE-HOMATROPINE 5-1.5 MG/5ML PO SYRP: 5.0000 mL | ORAL_SOLUTION | Freq: Four times a day (QID) | ORAL | 0 refills | Status: DC | PRN

## 2020-02-18 MED ORDER — TOBRAMYCIN 0.3 % OP SOLN: 2.0000 [drp] | Freq: Four times a day (QID) | OPHTHALMIC | 0 refills | Status: DC

## 2020-02-18 MED ORDER — BUDESONIDE-FORMOTEROL FUMARATE 160-4.5 MCG/ACT IN AERO: 2.0000 | INHALATION_SPRAY | Freq: Two times a day (BID) | RESPIRATORY_TRACT | 3 refills | Status: DC

## 2020-02-18 MED FILL — HYCODAN 5-1.5 MG/5ML SYRP: 5-1.5 | 5 days supply | Qty: 100 | Fill #0

## 2020-02-18 MED FILL — SYMBICORT 160-4.5 MCG INH: 160-4.5 | 30 days supply | Qty: 10 | Fill #0

## 2020-02-18 MED FILL — TOBRAMYCIN 0.3 % SOLN: 0.3 | 6 days supply | Qty: 5 | Fill #0

## 2020-02-18 NOTE — Progress Notes (Signed)
Subjective:    Patient ID: Alexis [name], female    DOB: 1950-07-02, 69 y.o.   MRN: [id number]  HPI  Virtual Visit via Video Note  I connected with Alexis [name] on 02/18/20 at 10:00 AM EST by a video enabled telemedicine application and verified that I am speaking with the correct person using two identifiers.  Location: Patient: home Provider: home     I discussed the limitations of evaluation and management by telemedicine and the availability of in person appointments. The patient expressed understanding and agreed to proceed.  History of Present Illness: Pt has recent coughing. She states her coughing was determined to be from asthma when at [facility] per pt. She has been wheezing some. Pt is on albuterol inhaler twice a day. Pt has gotten covid vaccines in past but has not been boosted.   Cough has been present for about one month.   Pt has some matting to both eyes.   Pt has hx of chf. Pt is on lasix. 80 mg in am and 20 mg 1 pm.  Pt has some recent dialysis. She has been on dialysis for about 3-4 weeks. States one week after most recent surgery had some complication with electrolyte abnormalities. She had vascular surgery at [facility] left lower extremity. She had blockages per pt. No covid test since left hospital.   Pt was discharged from [facility] around February 01, 2020 and then states went to [facility].      Observations/Objective:   General-no acute distress, pleasant, oriented. Lungs- on inspection lungs appear unlabored. Neck- no tracheal deviation or jvd on inspection. Neuro- gross motor function appears intact.  Assessment and Plan: Cough recently not been present for 1 month.  Patient associates it with no wheezing and is convinced that cough is elevated.  Her oxygen saturation is 99% presently.  She does have a history of CHF.  No fevers.  Reporting any associated Covid type signs and symptoms presently.  Though I did explain the benefit of  Covid testing.  Did recommend a over-the-counter type test if symptoms change or worsen.  Patient does have history of CHF and she is at home presently.  Daughter indicates it would be difficult/impossible presently to get her to the med center.  Therefore chest x-ray and labs not able to be done today.  Canceled would proceed with extreme caution based on her medical history.  Presently will give Symbicort inhaler and make Hycodan cough syrup available.  Continue check her O2 sats.  Patient does have home health service and they will be coming again on Wednesday to check vitals and status of patient.  If not doing well we could arrange for portable chest x-ray and lab draw.  Patient does have history of wound pain secondary to prior vascular surgeries.  In the recent past she is used oxycodone for pain.  I went over extensively with her and the daughter not to use oxycodone and Hycodan cough medicine at the same time.  In fact asked daughter to go ahead and separate out her oxycodone.  Daughter agreed she would do so.  History of diabetes.  Continue current diabetic treatment regimen.  History of recent need for dialysis post surgical complications.  Per family kidney function is now returning and she might not have to be on dialysis permanently.  History of hypertension.  BP stable.  Recent described conjunctivitis.  Will prescribe Tobrex.  Follow-up in 1 week or as needed.  Follow Up Instructions:  I discussed the assessment and treatment plan with the patient. The patient was provided an opportunity to ask questions and all were answered. The patient agreed with the plan and demonstrated an understanding of the instructions.   The patient was advised to call back or seek an in-person evaluation if the symptoms worsen or if the condition fails to improve as anticipated. Time spent with patient today was   minutes which consisted of chart revdiew, discussing diagnosis, work up treatment  and documentation..   Time spent with patient today was 40+  minutes which consisted of chart review, discussing diagnosis, work up treatment and documentation.   Mackie Pai, PA-C    Review of Systems  Constitutional: Negative for chills, fatigue and fever.  Respiratory: Positive for cough and wheezing.   Cardiovascular: Negative for chest pain and palpitations.  Gastrointestinal: Negative for abdominal pain.  Musculoskeletal: Negative for back pain.  Skin: Negative for rash.  Neurological: Negative for dizziness, speech difficulty, weakness, numbness and headaches.  Hematological: Negative for adenopathy.  Psychiatric/Behavioral: Negative for decreased concentration.     Past Medical History:  Diagnosis Date  . Allergy   . Anxiety   . Arthritis   . Asthma   . Atrial fibrillation ([place])   . CHF (congestive heart failure) ([place])   . Cirrhosis ([place])   . COPD (chronic obstructive pulmonary disease) ([place])   . Depression 05/26/2019  . Diabetes mellitus without complication ([place])   . Hyperlipidemia   . Hypertension   . Macrocytic anemia 09/04/2018  . Myocardial infarction ([place])    several  . Pacemaker    CRT-P with RV lead and His Bundle lead ( high threshold)   . Peripheral neuropathy   . Pneumonia   . PONV (postoperative nausea and vomiting)    unsure of exactly what happened at [facility] in 2010 (knee surgery) that led to crital care  . Thyroid disease      Social History   Socioeconomic History  . Marital status: Married    Spouse name: Juanda Crumble  . Number of children: 1  . Years of education: Not on file  . Highest education level: Not on file  Occupational History  . 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 Smoking status: Former Smoker    Types: Cigarettes    Quit date: 03/01/1998    Years since quitting: 21.9  . Smokeless tobacco: Never Used  Vaping Use  . Vaping Use: Never used  Substance and Sexual  Activity  . 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  . Drug use: Not Currently    Types: Marijuana    Comment: remote use  . Sexual activity: Never  Other Topics Concern  . 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 Difficulty of Paying Living Expenses: Not very hard  Food Insecurity: No Food Insecurity  . Worried About Charity fundraiser in the Last Year: Never true  . Ran Out of Food in the Last Year: Never true  Transportation  Needs: No Transportation Needs  . Lack of Transportation (Medical): No  . Lack of Transportation (Non-Medical): No  Physical Activity: Not on file  Stress: Not on file  Social Connections: Not on file  Intimate Partner Violence: Not on file    Past Surgical History:  Procedure Laterality Date  . ABDOMINAL AORTOGRAM W/LOWER EXTREMITY N/A 04/29/2016   Procedure: Abdominal Aortogram w/Lower Extremity;  Surgeon: Waynetta Sandy, MD;  Location: [facility];  Service: Cardiovascular;  Laterality: N/A;  . ABDOMINAL AORTOGRAM W/LOWER EXTREMITY N/A 05/06/2017   Procedure: ABDOMINAL AORTOGRAM W/LOWER EXTREMITY;  Surgeon: Angelia Mould, MD;  Location: [facility];  Service: Cardiovascular;  Laterality: N/A;  bilateral  . ABDOMINAL AORTOGRAM W/LOWER EXTREMITY Bilateral 05/04/2019   Procedure: ABDOMINAL AORTOGRAM W/LOWER EXTREMITY;  Surgeon: Angelia Mould, MD;  Location: [place] [facility];  Service: Cardiovascular;  Laterality: Bilateral;  . ABDOMINAL AORTOGRAM W/LOWER EXTREMITY Left 08/27/2019   Procedure: ABDOMINAL AORTOGRAM W/LOWER EXTREMITY;  Surgeon:  Waynetta Sandy, MD;  Location: [place];  Service: Cardiovascular;  Laterality: Left;  . ABDOMINAL HYSTERECTOMY    . AMPUTATION Right 07/30/2016   Procedure: AMPUTATION RIGHT SECOND TOE;  Surgeon: Angelia Mould, MD;  Location: [facility];  Service: Vascular;  Laterality: Right;  . CARDIAC CATHETERIZATION     2005 at [place] N/A 12/25/2018   Procedure: COLONOSCOPY WITH PROPOFOL;  Surgeon: Jackquline Denmark, MD;  Location: [facility];  Service: Endoscopy;  Laterality: N/A;  . ESOPHAGEAL BANDING  12/25/2018   Procedure: ESOPHAGEAL BANDING;  Surgeon: Jackquline Denmark, MD;  Location: [facility];  Service: Endoscopy;;  . ESOPHAGEAL BANDING  05/01/2019   Procedure: ESOPHAGEAL BANDING;  Surgeon: Jackquline Denmark, MD;  Location: [facility];  Service: Endoscopy;;  . ESOPHAGOGASTRODUODENOSCOPY (EGD) WITH PROPOFOL N/A 12/25/2018   Procedure: ESOPHAGOGASTRODUODENOSCOPY (EGD) WITH PROPOFOL;  Surgeon: Jackquline Denmark, MD;  Location: [facility];  Service: Endoscopy;  Laterality: N/A;  . ESOPHAGOGASTRODUODENOSCOPY (EGD) WITH PROPOFOL N/A 05/01/2019   Procedure: ESOPHAGOGASTRODUODENOSCOPY (EGD) WITH PROPOFOL;  Surgeon: Jackquline Denmark, MD;  [facility]: [facility];  Service: Endoscopy;  Laterality: N/A;  . LOWER EXTREMITY ANGIOGRAPHY Right 05/14/2019   Procedure: LOWER EXTREMITY ANGIOGRAPHY;  Surgeon: Waynetta Sandy, MD;  Location: [place] [facility];  Service: Cardiovascular;  Laterality: Right;  . PERIPHERAL VASCULAR ATHERECTOMY Right 04/29/2016   Procedure: Peripheral Vascular Atherectomy-Right Popliteal;  Surgeon: Waynetta Sandy, MD;  Location: [place];  Service: Cardiovascular;  Laterality: Right;  . PERIPHERAL VASCULAR ATHERECTOMY Right 05/14/2019   Procedure: PERIPHERAL VASCULAR ATHERECTOMY;  Surgeon: Waynetta Sandy, MD;  Location: [place];  Service: Cardiovascular;  Laterality: Right;  right SFA/pop  .  PERIPHERAL VASCULAR BALLOON ANGIOPLASTY Left 08/27/2019   Procedure: PERIPHERAL VASCULAR BALLOON ANGIOPLASTY;  Surgeon: Waynetta Sandy, MD;  Location: [facility];  Service: Cardiovascular;  Laterality: Left;  popliteal  . PERIPHERAL VASCULAR INTERVENTION Right 04/29/2016   Procedure: Peripheral Vascular Intervention-Right Popliteal;  Surgeon: Waynetta Sandy, MD;  Location: [place];  Service: Cardiovascular;  Laterality: Right;  POPLITEAL PTA  . PERIPHERAL VASCULAR INTERVENTION Right 05/14/2019   Procedure: PERIPHERAL VASCULAR INTERVENTION;  Surgeon: Waynetta Sandy, MD;  Location: [place] [facility];  Service: Cardiovascular;  Laterality: Right;  popliteal stent  . RADIOFREQUENCY ABLATION    . TOTAL KNEE ARTHROPLASTY    . TUBAL LIGATION      Family History  Problem Relation Age of Onset  .  Diabetes Mother   . Hyperlipidemia Mother   . Diabetes Father   . Hyperlipidemia Father   . Heart disease Father        before age 70  . Heart attack Father   . Diabetes Brother   . Hyperlipidemia Brother   . Heart attack Brother     Allergies  Allergen Reactions  . Indomethacin Other (See Comments)    Renal Insufficiency  . Pregabalin Other (See Comments)    DIZZINESS   . Sulfa Antibiotics Hives  . Clindamycin/Lincomycin   . Morphine And Related Nausea And Vomiting    Current Outpatient Medications on File Prior to Visit  Medication Sig Dispense Refill  . acetaminophen (TYLENOL) 500 MG tablet Take by mouth.    Marland Kitchen albuterol (VENTOLIN HFA) 108 (90 Base) MCG/ACT inhaler Inhale 2 puffs into the lungs every 6 (six) hours as needed for wheezing or shortness of breath.    . allopurinol (ZYLOPRIM) 100 MG tablet Take 1 tablet (100 mg total) by mouth 2 (two) times daily. 60 tablet 3  . AMBULATORY NON FORMULARY MEDICATION Motorized scooter.  Diagnosis: Osteoarthritis M19.90 1 each 0  . atorvastatin (LIPITOR) 10 MG tablet Take 1 tablet (10 mg total) by  mouth daily. 30 tablet 3  . cephALEXin (KEFLEX) 500 MG capsule Take 1 capsule (500 mg total) by mouth 3 (three) times daily. 30 capsule 0  . cephALEXin (KEFLEX) 500 MG capsule Take 1 capsule (500 mg total) by mouth 3 (three) times daily. 30 capsule 0  . clopidogrel (PLAVIX) 75 MG tablet Take 1 tablet (75 mg total) by mouth daily. 30 tablet 3  . Continuous Blood Gluc Receiver (FREESTYLE LIBRE 14 DAY READER) DEVI Use as directed to monitor BG.  Dx E11.65    . Continuous Blood Gluc Sensor (FREESTYLE LIBRE 14 DAY SENSOR) MISC INJECT INTO THE SKIN EVERY 14 DAYS    . doxycycline (VIBRA-TABS) 100 MG tablet Take 1 tablet (100 mg total) by mouth 2 (two) times daily. 20 tablet 0  . fluticasone (FLONASE) 50 MCG/ACT nasal spray Place 2 sprays into both nostrils at bedtime. (Patient taking differently: Place 2 sprays into both nostrils in the morning and at bedtime. ) 16 g 3  . furosemide (LASIX) 20 MG tablet Take two tablets by mouth in the morning and one tablet by mouth at noon. 270 tablet 3  . gabapentin (NEURONTIN) 300 MG capsule Take 300 mg by mouth 3 (three) times daily. (Patient not taking: Reported on 01/01/2020)    . Insulin Aspart FlexPen 100 UNIT/ML SOPN Inject 0-10 Units into the skin daily as needed (If glucose if over 200). Sliding scale    . insulin degludec (TRESIBA) 100 UNIT/ML FlexTouch Pen Inject 14 Units into the skin daily.     . Insulin Pen Needle (PEN NEEDLES 31GX5/16") 31G X 8 MM MISC Use as needed 4 times/day.  Dx E11.65    . levocetirizine (XYZAL) 5 MG tablet Take 1 tablet (5 mg total) by mouth every evening. 30 tablet 2  . levofloxacin (LEVAQUIN) 500 MG tablet Take 1 tablet (500 mg total) by mouth daily. 7 tablet 0  . levothyroxine (SYNTHROID) 150 MCG tablet Take 150 mcg by mouth daily before breakfast.     . lidocaine (XYLOCAINE) 4 % external solution Apply topically.    Marland Kitchen losartan (COZAAR) 25 MG tablet Take 1 tablet (25 mg total) by mouth daily. 90 tablet 3  . Melatonin 10 MG TABS  Take 20 mg by mouth at bedtime.     Marland Kitchen  methocarbamol (ROBAXIN) 500 MG tablet 1 tab po bid 30 tablet 0  . metoprolol succinate (TOPROL-XL) 25 MG 24 hr tablet TAKE ONE (1) TABLET BY MOUTH EVERY DAY 90 tablet 1  . ondansetron (ZOFRAN-ODT) 4 MG disintegrating tablet Take 4 mg by mouth every 8 (eight) hours as needed for nausea or vomiting.     Glory Rosebush VERIO test strip 3 (three) times daily.    . Prenatal Vit-Fe Fumarate-FA (MULTIVITAMIN-PRENATAL) 27-0.8 MG TABS tablet Take 1 tablet by mouth daily.     . Probiotic CAPS Take 2 capsules by mouth daily. 50 billion (gummy)    . RABEprazole (ACIPHEX) 20 MG tablet Take 1 tablet (20 mg total) by mouth daily. 30 tablet 6  . sertraline (ZOLOFT) 50 MG tablet Take 1 tablet (50 mg total) by mouth daily. (Patient not taking: Reported on 01/01/2020) 30 tablet 3  . spironolactone (ALDACTONE) 100 MG tablet Take 1 tablet (100 mg total) by mouth daily. 30 tablet 6  . triamcinolone ointment (KENALOG) 0.1 % Apply to     No current facility-administered medications on file prior to visit.    BP 136/64   Pulse (!) 56   Wt 220 lb (99.8 kg)   SpO2 99%   BMI 40.24 kg/m       Objective:   Physical Exam        Assessment & Plan:

## 2020-02-18 NOTE — Chronic Care Management (AMB) (Deleted)
Chronic Care Management Pharmacy  Name: Alexis [name]  MRN: [id number] DOB: September 27, 1950  Chief Complaint/ HPI  Alexis [name],  69 y.o. , female presents for their Follow-Up CCM visit with the clinical pharmacist via telephone due to COVID-19 Pandemic.  PCP : Mackie Pai, PA-C  Their chronic conditions include:  DM, CHF, PVD, CHF, AFib, HTN, Hypothyroid, Osteoarthritis, Gout, HLD  Office Visits: None since last CCM visit on 01/30/20.   Consult Visit: 01/29/20: Incision and drainage of post op wound inection - Left groin wound vac  01/08/20: Podiatry visit w/ Dr. Jacqualyn Posey - Debrided wound. Continue medihoney dressing every other Karlye Ihrig.   01/02/20: Endo visit w/ Peri Jefferson, PA-C - A1c 7.2% down from 7.4%. Continue Tresiba 14 units daily and Humalog TID per scale: BG < 200: 0 units BG 201-250: 2 units BG 251-300: 4 units BG 301-350: 6 units BG 351-400: 8 units BG 401-450: 10 units BG > 451: 12 units Will attempt to get patient Libre 2 CGM.    Medications: Outpatient Encounter Medications as of 02/18/2020  Medication Sig Note  . acetaminophen (TYLENOL) 500 MG tablet Take by mouth.   Marland Kitchen albuterol (VENTOLIN HFA) 108 (90 Base) MCG/ACT inhaler Inhale 2 puffs into the lungs every 6 (six) hours as needed for wheezing or shortness of breath.   . allopurinol (ZYLOPRIM) 100 MG tablet Take 1 tablet (100 mg total) by mouth 2 (two) times daily.   . AMBULATORY NON FORMULARY MEDICATION Motorized scooter.  Diagnosis: Osteoarthritis M19.90 06/05/2019: Reports her scooter and has some mechanical issues and a technician is scheduled to come and look at it this week.  Marland Kitchen atorvastatin (LIPITOR) 10 MG tablet Take 1 tablet (10 mg total) by mouth daily.   . budesonide-formoterol (SYMBICORT) 160-4.5 MCG/ACT inhaler Inhale 2 puffs into the lungs 2 (two) times daily.   . cephALEXin (KEFLEX) 500 MG capsule Take 1 capsule (500 mg total) by mouth 3 (three) times daily.   . cephALEXin (KEFLEX) 500 MG  capsule Take 1 capsule (500 mg total) by mouth 3 (three) times daily.   . clopidogrel (PLAVIX) 75 MG tablet Take 1 tablet (75 mg total) by mouth daily.   . Continuous Blood Gluc Receiver (FREESTYLE LIBRE 14 Xitlalli Newhard READER) DEVI Use as directed to monitor BG.  Dx E11.65   . Continuous Blood Gluc Sensor (FREESTYLE LIBRE 14 Yianna Tersigni SENSOR) MISC INJECT INTO THE SKIN EVERY 14 DAYS   . doxycycline (VIBRA-TABS) 100 MG tablet Take 1 tablet (100 mg total) by mouth 2 (two) times daily.   . fluticasone (FLONASE) 50 MCG/ACT nasal spray Place 2 sprays into both nostrils at bedtime. (Patient taking differently: Place 2 sprays into both nostrils in the morning and at bedtime. )   . furosemide (LASIX) 20 MG tablet Take two tablets by mouth in the morning and one tablet by mouth at noon.   . gabapentin (NEURONTIN) 300 MG capsule Take 300 mg by mouth 3 (three) times daily. (Patient not taking: Reported on 01/01/2020) 01/01/2020: Developed tremors  . HYDROcodone-homatropine (HYCODAN) 5-1.5 MG/5ML syrup Take 5 mLs by mouth every 6 (six) hours as needed.   . Insulin Aspart FlexPen 100 UNIT/ML SOPN Inject 0-10 Units into the skin daily as needed (If glucose if over 200). Sliding scale   . insulin degludec (TRESIBA) 100 UNIT/ML FlexTouch Pen Inject 14 Units into the skin daily.  11/06/2019: Client state she takes 20 units daily  . Insulin Pen Needle (PEN NEEDLES 31GX5/16") 31G X 8 MM MISC Use  as needed 4 times/Banner Huckaba.  Dx E11.65   . levocetirizine (XYZAL) 5 MG tablet Take 1 tablet (5 mg total) by mouth every evening.   Marland Kitchen levofloxacin (LEVAQUIN) 500 MG tablet Take 1 tablet (500 mg total) by mouth daily.   Marland Kitchen levothyroxine (SYNTHROID) 150 MCG tablet Take 150 mcg by mouth daily before breakfast.    . lidocaine (XYLOCAINE) 4 % external solution Apply topically.   Marland Kitchen losartan (COZAAR) 25 MG tablet Take 1 tablet (25 mg total) by mouth daily.   . Melatonin 10 MG TABS Take 20 mg by mouth at bedtime.    . methocarbamol (ROBAXIN) 500 MG tablet 1  tab po bid   . metoprolol succinate (TOPROL-XL) 25 MG 24 hr tablet TAKE ONE (1) TABLET BY MOUTH EVERY Kamerin Axford   . ondansetron (ZOFRAN-ODT) 4 MG disintegrating tablet Take 4 mg by mouth every 8 (eight) hours as needed for nausea or vomiting.    Glory Rosebush VERIO test strip 3 (three) times daily.   . Prenatal Vit-Fe Fumarate-FA (MULTIVITAMIN-PRENATAL) 27-0.8 MG TABS tablet Take 1 tablet by mouth daily.    . Probiotic CAPS Take 2 capsules by mouth daily. 50 billion (gummy)   . RABEprazole (ACIPHEX) 20 MG tablet Take 1 tablet (20 mg total) by mouth daily.   . sertraline (ZOLOFT) 50 MG tablet Take 1 tablet (50 mg total) by mouth daily. (Patient not taking: Reported on 01/01/2020)   . spironolactone (ALDACTONE) 100 MG tablet Take 1 tablet (100 mg total) by mouth daily.   Marland Kitchen tobramycin (TOBREX) 0.3 % ophthalmic solution Place 2 drops into both eyes every 6 (six) hours.   . triamcinolone ointment (KENALOG) 0.1 % Apply to    No facility-administered encounter medications on file as of 02/18/2020.   Immunization History  Administered Date(s) Administered  . Fluad Quad(high Dose 65+) 11/08/2018  . Hepatitis B, adult 01/31/2019  . Hepatitis B, ped/adol 03/19/2019, 03/19/2019, 08/14/2019, 08/14/2019  . Influenza Split 01/05/2007, 12/23/2010, 01/03/2012, 05/11/2012  . Influenza, High Dose Seasonal PF 01/09/2015, 12/24/2015, 01/14/2017, 12/12/2017  . Influenza,inj,Quad PF,6+ Mos 12/24/2013, 01/09/2015  . Influenza-Unspecified 01/05/2007, 12/23/2010, 01/03/2012, 05/11/2012, 01/09/2015  . PFIZER SARS-COV-2 Vaccination 06/16/2019, 07/09/2019  . Pneumococcal Conjugate-13 01/09/2015  . Pneumococcal Polysaccharide-23 05/11/2012  . Td 12/23/2010  . Tdap 12/23/2010     Current Diagnosis/Assessment:  Goals Addressed   None    Patient is very confused about medication regimen.  Significant time spent addressing DM as that is the most pressing issue for the patient noting her diabetic foot infections and a1c  >/= 8%.   Emergency Bypass surgery 09/2019  Has two grandkids (girl (78years old and boy)  Update 01/30/20 Patient currently still hospitalized. She is unsure when she will be discharged home. Strongly encouraged her to give me a call when she is discharged to assure she has the proper medications delivered.    Diabetes   Followed by Endo (Autumn Jones, PA-C)  A1c goal <7%   Problem Story During initial CCM visit in 04/2019 patient was only on bolus insulin per sliding scale with a1c 9% or greater. Coordinated care for patient to be seen by endo to start a basal/bolus regimen or pursue V-go insulin pump patient used in past. Patient was eventually started on basal/bolus regimen.  A1c has improved, but still above goal.   Recent Relevant Labs: Lab Results  Component Value Date/Time   HGBA1C 7.7 (H) 05/26/2019 04:49 PM   HGBA1C 9.0 04/03/2015 12:00 AM   GFR 57.41 (L) 05/23/2019 01:38 PM  GFR 52.36 (L) 04/12/2019 08:47 AM   MICROALBUR 0.3 04/10/2015 10:40 AM    Last diabetic Eye exam:  Lab Results  Component Value Date/Time   HMDIABEYEEXA No Retinopathy 04/13/2017 12:00 AM    Last diabetic Foot exam: No results found for: HMDIABFOOTEX   Checking BG: Daily with Freestyle Libre  Patient is currently uncontrolled on the following medications:   Tresiba 14 units daily  Novolog 4-10 units before meals   Last diabetic Eye exam:  Lab Results  Component Value Date/Time   HMDIABEYEEXA No Retinopathy 04/13/2017 12:00 AM    Last diabetic Foot exam: No results found for: HMDIABFOOTEX   We discussed:  Importance of having a basal/bolus regimen or getting set up with V-go insulin pump to gain better DM control    Update 01/01/20 No specific readings to report.   Reports Novolog Scale to be the following 200: 0 units 201-250: 2 units 251-300: 4 units 301-350: 6 units 351-400: 8 units 401-450: 10 units  Update 01/30/20 No specific readings to report as patient is still in  hospital. Note her a1c is now 7.2% as of 01/02/20.  Will continue to follow.  Update 02/06/20  Plan -Continue current medications -Follow up with Autumn Jones  Heart Failure   Type: Combined Systolic and Diastolic  Last ejection fraction: 09/2018 40%  Patient has failed these meds in past: ramipril (low bp, renal function fluctuation?) Patient is currently on the following medications:   Furosemide 58m 2AM and 1 PM  Losartan 244mdaily AM  Metoprolol succinate 2533maily AM  Spironolactone 71m12mily (Doesn't have at home currently)  Gained 20lbs over two days about 2-3 weekends ago. Resolved after doing usual regimen and being more strict about her diet.   She has developed 10lb weight gain again and was told to start regimen of furosemide 40mg81mce daily Encouraged patient to start regiment of increased furosemide and encouraged her to receive refill of spironolactone  Update 01/30/20 Need to follow closely to assure patient has necessary medications and to prevent readmission.   Update 02/06/20  Plan -Continue current medications  Hypertension   BP goal is:  <130/80  Office blood pressures are  BP Readings from Last 3 Encounters:  02/18/20 136/64  12/24/19 128/60  10/05/19 124/72   Patient checks BP at home infrequently Patient home BP readings are ranging: Unable to assess  Patient has failed these meds in the past: ramipril (low bp, renal function fluctuation?) Patient is currently controlled on the following medications:  . Losartan 25mg 1my . Metoprolol Succinate 25mg d55m  . Spironolactone 25mg da19m(does not currently have)  We discussed  BP goal    Update 01/30/20 No specific readings to provide. Will continue to monitor  Update 02/06/20  Plan -Check BP 2-3 times per week and record -Continue current medications    Hyperlipidemia   LDL goal < 70  Last lipids Lab Results  Component Value Date   CHOL 93 (L) 05/10/2018   HDL 31 (L)  05/10/2018   LDLCALC 33 05/10/2018   LDLDIRECT 111.0 04/10/2015   TRIG 147 05/10/2018   CHOLHDL 3.0 05/10/2018   Hepatic Function Latest Ref Rng & Units 09/18/2019 05/25/2019 05/23/2019  Total Protein 6.0 - 8.5 g/dL 6.9 7.4 7.4  Albumin 3.8 - 4.8 g/dL 3.7(L) 3.7 4.0  AST 0 - 40 IU/L 47(H) 163(H) 78(H)  ALT 0 - 32 IU/L 27 135(H) 69(H)  Alk Phosphatase 48 - 121 IU/L 297(H) 218(H) 256(H)  Total Bilirubin  0.0 - 1.2 mg/dL 0.9 1.1 0.9  Bilirubin, Direct 0.00 - 0.40 mg/dL - - -     The ASCVD Risk score Mikey Bussing DC Jr., et al., 2013) failed to calculate for the following reasons:   The patient has a prior MI or stroke diagnosis   Patient has failed these meds in past: None noted  Patient is currently controlled on the following medications:  . Atorvastatin 22m daily AM   Update 01/30/20 No complaints with regimen. Will assure adherence.  Update 02/06/20  Plan -Continue current medications   Hypothyroidism   Followed by Endo (Peri Jefferson PA-C)  Lab Results  Component Value Date/Time   TSH 2.09 03/15/2017 12:05 PM   TSH 15.50 (H) 12/16/2016 10:06 AM   FREET4 0.79 04/06/2016 01:57 AM    Patient has failed these meds in past: None noted  Patient is currently controlled on the following medications:  . Levothyroxine 1552m daily  Update 01/30/20 Would benefit from updated TSH?  Update 02/06/20  Plan -Continue current medications   Depression   Will assess regimen and medication options further at next visit Significant time spent on other portions of the visit.   [facility]  Decreased Interest 3  Down, Depressed, Hopeless 0  PHQ - 2 Score 3  Altered sleeping 3  Tired, decreased energy 0  Change in appetite 0  Feeling bad or failure about yourself  0  Trouble concentrating 3  Moving slowly or fidgety/restless 0  Suicidal thoughts 0  PHQ-9 Score 9  Some recent data might be hidden    Patient has failed these meds in past: amitriptyline,  venlafaxine, desvenlafaxine, buspirone (listed in D/C meds. No apparent reason for D/C), duloxetine (intoleration) Patient is currently uncontrolled on the following medications:  . Sertraline 5077maily (prescribed, but not taking)  Update 01/30/20 Will assess when patient is home  Update 02/06/20  Plan -Discuss options at next visit   GERD   Patient has failed these meds in past: None noted  Patient is currently controlled on the following medications:  . Rabeprazole 69m48mice daily  Breakthrough Sx: No  States new GI was told her she needed to get an EGD, but she is unsure if would like to complete this. States she has to get GI banding every 2 months  Pt states she does not have this medication at home. Noting GI history encouraged patient to get his refilled.  Update 01/30/20 Need to assure patient has received refill when home.  Update 02/06/20   Plan -Obtain refill of rabeprazole   Medication Management   Pt uses [facility] for all medications  Patient admits keeping up with her medications is confusing and would appreciate adherence packaging, synchronization, and delivery.  We discussed: Verbal consent obtained for [facility] enhanced pharmacy services (medication synchronization, adherence packaging, delivery coordination). A medication sync plan was created to allow patient to get all medications delivered once every 30 to 90 days per patient preference. Patient understands they have freedom to choose pharmacy and clinical pharmacist will coordinate care between all prescribers and [facility].  Update 01/30/20 Will need to follow up when patient is discharged home.  Update 02/06/20   Plan  Utilize [facility] for medication synchronization, packaging and delivery   Follow up:  1 week phone visit  Miscellaneous Meds Prenatal vitamin Metamucil  Ondansetron Mupirocin  lidocaine Dicloxacillin  KaneDe BlancharmD,  BCACP Clinical Pharmacist [phone]

## 2020-02-18 NOTE — Progress Notes (Addendum)
Chronic Care Management Pharmacy Assistant   Name: Alexis [name]  MRN: [id number] DOB: 03/05/1950  Reason for Encounter: Patient Phone Call  Patient Questions:  1.  Have you seen any other providers since your last visit? Yes  2.  Any changes in your medicines or health? Yes    PCP : Mackie Pai, PA-C    Allergies:   Allergies  Allergen Reactions   Indomethacin Other (See Comments)    Renal Insufficiency   Pregabalin Other (See Comments)    DIZZINESS    Sulfa Antibiotics Hives   Clindamycin/Lincomycin    Morphine And Related Nausea And Vomiting    Medications: Outpatient Encounter Medications as of 02/18/2020  Medication Sig Note   acetaminophen (TYLENOL) 500 MG tablet Take by mouth.    albuterol (VENTOLIN HFA) 108 (90 Base) MCG/ACT inhaler Inhale 2 puffs into the lungs every 6 (six) hours as needed for wheezing or shortness of breath.    allopurinol (ZYLOPRIM) 100 MG tablet Take 1 tablet (100 mg total) by mouth 2 (two) times daily.    AMBULATORY NON FORMULARY MEDICATION Motorized scooter.  Diagnosis: Osteoarthritis M19.90 06/05/2019: Reports her scooter and has some mechanical issues and a technician is scheduled to come and look at it this week.   atorvastatin (LIPITOR) 10 MG tablet Take 1 tablet (10 mg total) by mouth daily.    budesonide-formoterol (SYMBICORT) 160-4.5 MCG/ACT inhaler Inhale 2 puffs into the lungs 2 (two) times daily.    cephALEXin (KEFLEX) 500 MG capsule Take 1 capsule (500 mg total) by mouth 3 (three) times daily.    cephALEXin (KEFLEX) 500 MG capsule Take 1 capsule (500 mg total) by mouth 3 (three) times daily.    clopidogrel (PLAVIX) 75 MG tablet Take 1 tablet (75 mg total) by mouth daily.    Continuous Blood Gluc Receiver (FREESTYLE LIBRE 14 DAY READER) DEVI Use as directed to monitor BG.  Dx E11.65    Continuous Blood Gluc Sensor (FREESTYLE LIBRE 14 DAY SENSOR) MISC INJECT INTO THE SKIN EVERY 14 DAYS    doxycycline (VIBRA-TABS) 100 MG  tablet Take 1 tablet (100 mg total) by mouth 2 (two) times daily.    fluticasone (FLONASE) 50 MCG/ACT nasal spray Place 2 sprays into both nostrils at bedtime. (Patient taking differently: Place 2 sprays into both nostrils in the morning and at bedtime. )    furosemide (LASIX) 20 MG tablet Take two tablets by mouth in the morning and one tablet by mouth at noon.    gabapentin (NEURONTIN) 300 MG capsule Take 300 mg by mouth 3 (three) times daily. (Patient not taking: Reported on 01/01/2020) 01/01/2020: Developed tremors   HYDROcodone-homatropine (HYCODAN) 5-1.5 MG/5ML syrup Take 5 mLs by mouth every 6 (six) hours as needed.    Insulin Aspart FlexPen 100 UNIT/ML SOPN Inject 0-10 Units into the skin daily as needed (If glucose if over 200). Sliding scale    insulin degludec (TRESIBA) 100 UNIT/ML FlexTouch Pen Inject 14 Units into the skin daily.  11/06/2019: Client state she takes 20 units daily   Insulin Pen Needle (PEN NEEDLES 31GX5/16") 31G X 8 MM MISC Use as needed 4 times/day.  Dx E11.65    levocetirizine (XYZAL) 5 MG tablet Take 1 tablet (5 mg total) by mouth every evening.    levofloxacin (LEVAQUIN) 500 MG tablet Take 1 tablet (500 mg total) by mouth daily.    levothyroxine (SYNTHROID) 150 MCG tablet Take 150 mcg by mouth daily before breakfast.     lidocaine (  XYLOCAINE) 4 % external solution Apply topically.    losartan (COZAAR) 25 MG tablet Take 1 tablet (25 mg total) by mouth daily.    Melatonin 10 MG TABS Take 20 mg by mouth at bedtime.     methocarbamol (ROBAXIN) 500 MG tablet 1 tab po bid    metoprolol succinate (TOPROL-XL) 25 MG 24 hr tablet TAKE ONE (1) TABLET BY MOUTH EVERY DAY    ondansetron (ZOFRAN-ODT) 4 MG disintegrating tablet Take 4 mg by mouth every 8 (eight) hours as needed for nausea or vomiting.     ONETOUCH VERIO test strip 3 (three) times daily.    Prenatal Vit-Fe Fumarate-FA (MULTIVITAMIN-PRENATAL) 27-0.8 MG TABS tablet Take 1 tablet by mouth daily.     Probiotic CAPS Take 2  capsules by mouth daily. 50 billion (gummy)    RABEprazole (ACIPHEX) 20 MG tablet Take 1 tablet (20 mg total) by mouth daily.    sertraline (ZOLOFT) 50 MG tablet Take 1 tablet (50 mg total) by mouth daily. (Patient not taking: Reported on 01/01/2020)    spironolactone (ALDACTONE) 100 MG tablet Take 1 tablet (100 mg total) by mouth daily.    tobramycin (TOBREX) 0.3 % ophthalmic solution Place 2 drops into both eyes every 6 (six) hours.    triamcinolone ointment (KENALOG) 0.1 % Apply to    No facility-administered encounter medications on file as of 02/18/2020.    Current Diagnosis: Patient Active Problem List   Diagnosis Date Noted   Depression 05/26/2019   Cellulitis 05/26/2019   Other cirrhosis of liver ([place]) 31/49/7026   Chronic systolic CHF (congestive heart failure) ([place]) 05/26/2019   Cellulitis in diabetic foot ([place]) 05/25/2019   Epigastric pain    Idiopathic esophageal varices without bleeding ([place])    Anemia due to chronic blood loss    Paroxysmal atrial fibrillation ([place]) 09/26/2018   Macrocytic anemia 09/04/2018   Pacemaker 08/25/2018   Insulin dependent diabetes mellitus with complications 37/85/8850   Constipation 08/25/2018   Fluid overload 08/25/2018   Chronic anticoagulation 08/25/2018   Morbid obesity ([place]) 08/25/2018   Chronic diastolic heart failure ([place]) 05/17/2018   Acute bronchitis with COPD ([place]) 01/16/2018   Acquired absence of other right toe(s) ([place]) 11/16/2017   Type 2 diabetes mellitus with foot ulcer ([place]) 11/16/2017   Crush injury of left foot, initial encounter 11/28/2016   Osteomyelitis ([place]) 07/30/2016   Gangrene of foot ([place]) 04/27/2016   Thrombocytopenia ([place])    Vomiting and diarrhea 04/05/2016   Hyperlipidemia 11/18/2015   Gout 08/05/2014   Cough 08/05/2014   Allergic rhinitis 07/12/2014   Osteoarthritis 07/12/2014   Asthma 07/12/2014   Congestive heart failure ([place]) 07/12/2014   Permanent atrial fibrillation ([place]) 07/12/2014   HTN  (hypertension) 07/12/2014   Hypothyroidism 07/12/2014   History of substance abuse ([place]) 07/12/2014   Peripheral vascular disease ([place]) 04/02/2013   Discoloration of skin 04/02/2013    Goals Addressed   None    Patient called me and left a voicemail asking that I call her back in regards to new medication costs. Patient stated Dr. Harvie Heck prescribed two medications and they were sent to [facility]. The total cost for both medications were $60 and she was not comfortable with that cost. Reminded Alexis [name] that her preferred pharmacy should be [facility], and all of her medication needs should go there. Patient stated she forgot that she is using [facility] and have been providing [facility] as her pharmacy when asked by providers since being discharged from  the skill nursing facility. Attempted to review medication needs with the patient. She stated her daughter would know exactly which medications she needed and how much. She will be at her home around 2 pm. Asked that she call me then for thorough review.  Patient's daughter, Alexis [name], returned my call.  Reviewed chart for medication changes ahead of medication coordination call.  Office Visits: 02-18-2020 (PCP) Patient presented via video with Dr. Harvie Heck c/o coughing for about a month. She states her coughing was determined to be from asthma. Patient associates it with no wheezing and is convinced that cough is elevated. Pt has some recent dialysis. She has been on dialysis for about 3-4 weeks. There was also some matting in both eyes noted by PCP. Medication changes: Symbicort 160-4.5 mcg inhaler. Hydrocodone-Homatropine 5- 1.7m/ 542msusp every days six hours for cough. Tobramycin 0.3%; two drops in both eyes every six hours.  Consults: 02-14-2020 (Vascular Surg) Follow up app PA LaArdis Rowan12-16-2021 (Cardio) OV with NP PeLawson RadarNo hospital visits since last care coordination call.  Medication changes indicated.  Symbicort 160-4.5 mcg inhaler. Hydrocodone-Homatropine 5- 1.37m637m37ml31msp every days six hours for cough. Tobramycin 0.3%; two drops in both eyes every six hours.  BP Readings from Last 3 Encounters:  02/18/20 136/64  12/24/19 128/60  10/05/19 124/72    Lab Results  Component Value Date   HGBA1C 7.7 (H) 05/26/2019     Patient obtains medications through Adherence Packaging  90 Days   This is the first adherence delivery for the patient  The following medications were forwarded to the patient from the facility she was in: Levocetirizine 5 mg tab' one tab at [employer] on hand) Allopurinol 100 mg; one tab with Breakfast and one tab with Dinner one tab (12 on hand) Furosemide 20 mg: two tabs with Breakfast and two with Lunch (about 50 on hand) Atorvastatin 10 mg; one tab with Breakfast ( 26 on hand) Metoprolol Succinate 25 mg; one tab with Breakfast (9 on hand) Rabeprazole 20 mg; one tab with Breakfast (6 on hand) NephroVIte one tab at Breakfast (call Dr. SubbRosey [phone] script) Midodrine Hcl 10 mg; one tab three times a week M,W,F 30 mins prior to dialysis (call Dr. SubbRosey [phone] script) Levothyroxine 150 mcg; one tab before Breakfast Oxycodone 5 mg   Patient is due for next adherence delivery on: 02-21-2020. Called patient and reviewed medications and coordinated delivery.  This delivery to include: Losartan 25 mg; one tab with Breakfast Levocetirizine 5 mg tab' one tab at [employer] Inclopurinol 100 mg; one tab with Breakfast and one tab with Dinner Furosemide 20 mg: two tabs with Breakfast and two with Lunch  Atorvastatin 10 mg; one tab with Breakfast  Metoprolol Succinate 25 mg; one tab with Breakfast  Rabeprazole 20 mg; one tab with Breakfast  Levothyroxine 150 mcg; one tab before Breakfast Spironolactone 100 mg; one tab with Breakfast Clopidogrel 75 mg; one tab with Dinner Methocarbamol 500 mg; one tab with Breakfast, one with Dinner Oxycodone 5  mg T[place]iba Pen Needles 31 g   Patient declined the following medications due to having Freestyle Libre: One Touch Delica Lancets One Touch Test Strips  Patient does needs refills at this time .  Confirmed delivery date of 02-21-2020, advised patient that pharmacy will contact them the morning of delivery.  [facility] Dr. SubbRosey Bathscribed for the patent. Attempted to gather more information on  the duration of specific medications. Are they ongoing, or only needed during the patient's stay at SNF. Was transferred to a nursing supervisor's voicemail where I left a message to return my call.  Lattie Haw from [facility] called me back and stated she was unable to locate the patient in medical records in that facility.  Follow-Up:  Pharmacist Review   Fanny Skates, [facility] Pharmacist Assistant [phone]   Note patient has quantity of medication on hand, but if fillable, pt would benefit from adherence packaging as soon as possible.   17 minutes spent in review, coordination, and documentation.   Reviewed by: De Blanch, PharmD, BCACP Clinical Pharmacist [facility] [phone]

## 2020-02-18 NOTE — Patient Instructions (Addendum)
Cough recently not been present for 1 month.  Patient associates it with no wheezing and is convinced that cough is elevated.  Her oxygen saturation is 99% presently.  She does have a history of CHF.  No fevers.  Reporting any associated Covid type signs and symptoms presently.  Though I did explain the benefit of Covid testing.  Did recommend a over-the-counter type test if symptoms change or worsen.  Patient does have history of CHF and she is at home presently.  Daughter indicates it would be difficult/impossible presently to get her to the med center.  Therefore chest x-ray and labs not able to be done today.  Canceled would proceed with extreme caution based on her medical history.  Presently will give Symbicort inhaler and make Hycodan cough syrup available.  Continue check her O2 sats.  Patient does have home health service and they will be coming again on Wednesday to check vitals and status of patient.  If not doing well we could arrange for portable chest x-ray and lab draw.  Patient does have history of wound pain secondary to prior vascular surgeries.  In the recent past she is used oxycodone for pain.  I went over extensively with her and the daughter not to use oxycodone and Hycodan cough medicine at the same time.  In fact asked daughter to go ahead and separate out her oxycodone.  Daughter agreed she would do so.  History of diabetes.  Continue current diabetic treatment regimen.  History of recent need for dialysis post surgical complications.  Per family kidney function is now returning and she might not have to be on dialysis permanently.  History of hypertension.  BP stable.  Recent described conjunctivitis.  Will prescribe Tobrex.  Follow-up in 1 week or as needed.

## 2020-02-19 ENCOUNTER — Telehealth: Payer: Self-pay | Admitting: Medical

## 2020-02-19 NOTE — Telephone Encounter (Signed)
VO given.

## 2020-02-19 NOTE — Telephone Encounter (Signed)
Agree with pt going to dialysis.

## 2020-02-19 NOTE — Telephone Encounter (Addendum)
Caller: Will ([facility] ) Call back # [phone]  OT  Verbal Order 1 time a week for 1 week 2 time a week for 3 week  Ok to leave message  Can give verbal order and ask them to send in order. Will sign when back in office.

## 2020-02-19 NOTE — Telephone Encounter (Signed)
Alexis [name] [phone], Physical Therapist called in reference to patient's 9 lb weight gain. Patient has failed to go to dialysis for two days now, OT and PT are going to put patient in the [place] and take her to dialysis center to prevent re hospitalization.

## 2020-02-19 NOTE — Telephone Encounter (Addendum)
Caller: Gwinda Passe ([facility]) Call back number: [phone]  PT  2 time a week for 5 weeks 1 a week for 1 week  Verbal order  Verbal order authorize. If send over written will signs.  Mackie Pai, PA-C

## 2020-02-20 ENCOUNTER — Other Ambulatory Visit: Payer: Self-pay | Admitting: Vascular Surgery

## 2020-02-20 ENCOUNTER — Other Ambulatory Visit: Payer: Self-pay

## 2020-02-20 DIAGNOSIS — M79604 Pain in right leg: Secondary | ICD-10-CM | POA: Diagnosis not present

## 2020-02-20 DIAGNOSIS — R262 Difficulty in walking, not elsewhere classified: Secondary | ICD-10-CM | POA: Diagnosis not present

## 2020-02-20 DIAGNOSIS — I120 Hypertensive chronic kidney disease with stage 5 chronic kidney disease or end stage renal disease: Secondary | ICD-10-CM | POA: Diagnosis not present

## 2020-02-20 DIAGNOSIS — I959 Hypotension, unspecified: Secondary | ICD-10-CM | POA: Diagnosis not present

## 2020-02-20 DIAGNOSIS — R609 Edema, unspecified: Secondary | ICD-10-CM | POA: Diagnosis not present

## 2020-02-20 DIAGNOSIS — N186 End stage renal disease: Secondary | ICD-10-CM | POA: Diagnosis not present

## 2020-02-20 DIAGNOSIS — E1165 Type 2 diabetes mellitus with hyperglycemia: Secondary | ICD-10-CM | POA: Diagnosis not present

## 2020-02-20 DIAGNOSIS — R52 Pain, unspecified: Secondary | ICD-10-CM | POA: Diagnosis not present

## 2020-02-20 DIAGNOSIS — M25571 Pain in right ankle and joints of right foot: Secondary | ICD-10-CM | POA: Diagnosis not present

## 2020-02-20 DIAGNOSIS — I739 Peripheral vascular disease, unspecified: Secondary | ICD-10-CM | POA: Diagnosis not present

## 2020-02-20 NOTE — Patient Outreach (Signed)
  [facility]) Care Management Chronic Special Needs Program    02/20/2020  Name: Alexis [name], DOB: 1950/03/13  MRN: [id number]   Alexis [name] is enrolled in a chronic special needs plan for Diabetes. Notification received that client was discharged on 02/16/20 from [facility] to home with [facility] home health services. Individualized care plan reviewed and updated.  Client will receive transition of care follow up by [facility].    Goals Addressed            This Visit's Progress   . COMPLETED: General - Client will not be readmitted within 30 days (C-SNP)       Please follow discharge instructions and call provider if you have any questions. Please attend all follow up appointments as scheduled. Please take your medications as prescribed. Please call 24 Hour nurse advice line as needed [phone]).      . General - Client will not be readmitted within 30 days (C-SNP)       Please follow discharge instructions and call provider if you have any questions. Please attend all follow up appointments as scheduled. Please take your medications as prescribed. Please call 24 Hour nurse advice line as needed [phone]).       PLAN;  Care plan updated and sent to client's primary care provider.  [facility] will continue to provide services for this member through 02/29/20.  The [facility] care management team will assume care 03/01/2020.   Quinn Plowman RN,BSN,CCM [facility] Care Management [phone]

## 2020-02-20 NOTE — Telephone Encounter (Signed)
VO given.

## 2020-02-20 NOTE — Addendum Note (Signed)
Addended by: De Blanch on: 02/20/2020 11:15 AM   Modules accepted: Orders

## 2020-02-21 ENCOUNTER — Telehealth: Payer: Self-pay | Admitting: Podiatry

## 2020-02-21 ENCOUNTER — Telehealth: Payer: Self-pay | Admitting: Medical

## 2020-02-21 ENCOUNTER — Encounter: Payer: Self-pay | Admitting: Podiatry

## 2020-02-21 ENCOUNTER — Other Ambulatory Visit: Payer: Self-pay | Admitting: Podiatry

## 2020-02-21 ENCOUNTER — Ambulatory Visit: Payer: HMO | Admitting: Podiatry

## 2020-02-21 MED ORDER — SERTRALINE HCL 50 MG PO TABS: 50.0000 mg | ORAL_TABLET | Freq: Every day | ORAL | 3 refills | Status: DC

## 2020-02-21 NOTE — Telephone Encounter (Signed)
Refilled pt zoloft. Have her schedule virtual visit in 2  Weeks.

## 2020-02-21 NOTE — Telephone Encounter (Signed)
Patient states she's been going through a lot. Patient is feeling sad, crying spells. Patient would like to start back on zoloft.   Please advise

## 2020-02-21 NOTE — Telephone Encounter (Signed)
Opened to rx sertraline.

## 2020-02-21 NOTE — Telephone Encounter (Signed)
Pt is requesting orders to be [facility] home [facility] for her dressing change. They will not touch her toe and she needs them changed everyday like before.

## 2020-02-22 DIAGNOSIS — N179 Acute kidney failure, unspecified: Secondary | ICD-10-CM | POA: Diagnosis not present

## 2020-02-22 DIAGNOSIS — I110 Hypertensive heart disease with heart failure: Secondary | ICD-10-CM | POA: Diagnosis not present

## 2020-02-22 DIAGNOSIS — E871 Hypo-osmolality and hyponatremia: Secondary | ICD-10-CM | POA: Diagnosis not present

## 2020-02-22 DIAGNOSIS — R52 Pain, unspecified: Secondary | ICD-10-CM | POA: Diagnosis not present

## 2020-02-22 DIAGNOSIS — I9589 Other hypotension: Secondary | ICD-10-CM | POA: Diagnosis not present

## 2020-02-22 DIAGNOSIS — E039 Hypothyroidism, unspecified: Secondary | ICD-10-CM | POA: Diagnosis not present

## 2020-02-22 DIAGNOSIS — R209 Unspecified disturbances of skin sensation: Secondary | ICD-10-CM | POA: Diagnosis not present

## 2020-02-22 DIAGNOSIS — N170 Acute kidney failure with tubular necrosis: Secondary | ICD-10-CM | POA: Diagnosis not present

## 2020-02-22 DIAGNOSIS — M069 Rheumatoid arthritis, unspecified: Secondary | ICD-10-CM | POA: Diagnosis not present

## 2020-02-22 DIAGNOSIS — I252 Old myocardial infarction: Secondary | ICD-10-CM | POA: Diagnosis not present

## 2020-02-22 DIAGNOSIS — I779 Disorder of arteries and arterioles, unspecified: Secondary | ICD-10-CM | POA: Diagnosis not present

## 2020-02-22 DIAGNOSIS — I5032 Chronic diastolic (congestive) heart failure: Secondary | ICD-10-CM | POA: Diagnosis not present

## 2020-02-22 DIAGNOSIS — Z95828 Presence of other vascular implants and grafts: Secondary | ICD-10-CM | POA: Diagnosis not present

## 2020-02-22 DIAGNOSIS — N189 Chronic kidney disease, unspecified: Secondary | ICD-10-CM | POA: Diagnosis not present

## 2020-02-22 DIAGNOSIS — I70221 Atherosclerosis of native arteries of extremities with rest pain, right leg: Secondary | ICD-10-CM | POA: Diagnosis not present

## 2020-02-22 DIAGNOSIS — K7460 Unspecified cirrhosis of liver: Secondary | ICD-10-CM | POA: Diagnosis not present

## 2020-02-22 DIAGNOSIS — K219 Gastro-esophageal reflux disease without esophagitis: Secondary | ICD-10-CM | POA: Diagnosis not present

## 2020-02-22 DIAGNOSIS — E785 Hyperlipidemia, unspecified: Secondary | ICD-10-CM | POA: Diagnosis not present

## 2020-02-22 DIAGNOSIS — I4891 Unspecified atrial fibrillation: Secondary | ICD-10-CM | POA: Diagnosis not present

## 2020-02-22 DIAGNOSIS — J449 Chronic obstructive pulmonary disease, unspecified: Secondary | ICD-10-CM | POA: Diagnosis not present

## 2020-02-22 DIAGNOSIS — L97519 Non-pressure chronic ulcer of other part of right foot with unspecified severity: Secondary | ICD-10-CM | POA: Diagnosis not present

## 2020-02-22 DIAGNOSIS — I959 Hypotension, unspecified: Secondary | ICD-10-CM | POA: Diagnosis not present

## 2020-02-22 DIAGNOSIS — R918 Other nonspecific abnormal finding of lung field: Secondary | ICD-10-CM | POA: Diagnosis not present

## 2020-02-22 DIAGNOSIS — L97419 Non-pressure chronic ulcer of right heel and midfoot with unspecified severity: Secondary | ICD-10-CM | POA: Diagnosis not present

## 2020-02-22 DIAGNOSIS — I2510 Atherosclerotic heart disease of native coronary artery without angina pectoris: Secondary | ICD-10-CM | POA: Diagnosis not present

## 2020-02-22 DIAGNOSIS — I130 Hypertensive heart and chronic kidney disease with heart failure and stage 1 through stage 4 chronic kidney disease, or unspecified chronic kidney disease: Secondary | ICD-10-CM | POA: Diagnosis not present

## 2020-02-22 DIAGNOSIS — I2721 Secondary pulmonary arterial hypertension: Secondary | ICD-10-CM | POA: Diagnosis not present

## 2020-02-22 DIAGNOSIS — D6959 Other secondary thrombocytopenia: Secondary | ICD-10-CM | POA: Diagnosis not present

## 2020-02-22 DIAGNOSIS — I70222 Atherosclerosis of native arteries of extremities with rest pain, left leg: Secondary | ICD-10-CM | POA: Diagnosis not present

## 2020-02-22 DIAGNOSIS — T8189XA Other complications of procedures, not elsewhere classified, initial encounter: Secondary | ICD-10-CM | POA: Diagnosis not present

## 2020-02-22 DIAGNOSIS — E1142 Type 2 diabetes mellitus with diabetic polyneuropathy: Secondary | ICD-10-CM | POA: Diagnosis not present

## 2020-02-22 DIAGNOSIS — I953 Hypotension of hemodialysis: Secondary | ICD-10-CM | POA: Diagnosis not present

## 2020-02-22 DIAGNOSIS — E1151 Type 2 diabetes mellitus with diabetic peripheral angiopathy without gangrene: Secondary | ICD-10-CM | POA: Diagnosis not present

## 2020-02-22 DIAGNOSIS — R5381 Other malaise: Secondary | ICD-10-CM | POA: Diagnosis not present

## 2020-02-22 DIAGNOSIS — E1122 Type 2 diabetes mellitus with diabetic chronic kidney disease: Secondary | ICD-10-CM | POA: Diagnosis not present

## 2020-02-22 DIAGNOSIS — I43 Cardiomyopathy in diseases classified elsewhere: Secondary | ICD-10-CM | POA: Diagnosis not present

## 2020-02-22 DIAGNOSIS — E8770 Fluid overload, unspecified: Secondary | ICD-10-CM | POA: Diagnosis not present

## 2020-02-22 DIAGNOSIS — R609 Edema, unspecified: Secondary | ICD-10-CM | POA: Diagnosis not present

## 2020-02-22 DIAGNOSIS — T82858A Stenosis of vascular prosthetic devices, implants and grafts, initial encounter: Secondary | ICD-10-CM | POA: Diagnosis not present

## 2020-02-22 DIAGNOSIS — I739 Peripheral vascular disease, unspecified: Secondary | ICD-10-CM | POA: Diagnosis not present

## 2020-02-24 ENCOUNTER — Encounter: Payer: Self-pay | Admitting: Podiatry

## 2020-02-25 ENCOUNTER — Telehealth: Payer: Self-pay | Admitting: Pharmacist

## 2020-02-25 ENCOUNTER — Other Ambulatory Visit: Payer: Self-pay | Admitting: *Deleted

## 2020-02-25 ENCOUNTER — Encounter: Payer: Self-pay | Admitting: Podiatry

## 2020-02-25 NOTE — Telephone Encounter (Signed)
Messaged pt via [employer]

## 2020-02-25 NOTE — Telephone Encounter (Signed)
Called pt and lvm to return call.  

## 2020-02-25 NOTE — Patient Outreach (Signed)
[facility]) Care Management Chronic Special Needs Program    02/25/2020  Name: Alexis [name], DOB: 06/27/1950  MRN: [id number]   Alexis [name] is enrolled in a chronic special needs plan for Diabetes.  RN care manager received notification client is admitted to [facility] on 02/22/20 with Unspecified disturbances skin sensation. Per policy and procedure, individualized care plan sent to [facility] utilization management.  Goals      ":Client states she wants to continue work on getting her A1c down." (pt-stated)      Continue to eat low carbohydrate and low salt meals, watch portion sizes and avoid sugar sweetened drinks.   Continue to take your diabetic medication as prescribed and follow up with your doctor as recommended.  Continue to adhere to diabetes self management actions:  Glucose monitoring per provider recommendation  Check feet daily  Visit provider every 3-6 months as directed  Hbg A1C level every 3-6 months.  Eye Exam yearly  Carbohydrate controlled meal planning  Taking diabetes medication as prescribed by provider  Physical activity        A1c goal <7%      Continue to follow diabetes self management actions:  Glucose monitoring per provider recommendation  Visit provider every 3-6 months as directed  Hbg A1C level every 3-6 months.  Carbohydrate controlled meal planning  Taking diabetes medication as prescribed by provider  Physical activity       Chronic Care Management Pharmacy Care Plan      CARE PLAN ENTRY (see longitudinal plan of care for additional care plan information)  Current Barriers:   Chronic Disease Management support, education, and care coordination needs related to Diabetes, Heart Failure, Peripheral Vascular Disease, AFib, Hypertension, Hypothyroidism, Osteoarthritis, Gout, Hyperlipidemia   Hypertension BP Readings from Last 3 Encounters:  12/24/19 128/60   10/05/19 124/72  09/18/19 130/72    Pharmacist Clinical Goal(s): o Over the next 90 days, patient will work with PharmD and providers to maintain BP goal <130/80  Current regimen:   Losartan 75m daily  Metoprolol Succinate 225mdaily   Spironolactone 257maily  Interventions: o Discussed BP goal o Requested patient to check BP 2-3 times per week and record  Patient self care activities - Over the next 90 days, patient will: o Check BP 2-3 times per week, document, and provide at future appointments o Ensure daily salt intake < 2300 mg/day  Hyperlipidemia Lab Results  Component Value Date/Time   LDLCALC 33 05/10/2018 09:44 AM   LDLDIRECT 111.0 04/10/2015 10:40 AM    Pharmacist Clinical Goal(s): o Over the next 90 days, patient will work with PharmD and providers to maintain LDL goal < 70  Current regimen:  o Atorvastatin 45m8mily   Patient self care activities - Over the next 90 days, patient will: o Maintain cholesterol medication regimen.   Diabetes Lab Results  Component Value Date/Time   HGBA1C 7.7 (H) 05/26/2019 04:49 PM   HGBA1C 9.0 04/03/2015 12:00 AM    Pharmacist Clinical Goal(s): o Over the next 90 days, patient will work with PharmD and providers to achieve A1c goal <7%  Current regimen:   Tresiba 16 units daily  Novolog 4-10 units before meals  Interventions: o Discussed DM goals  Patient self care activities - Over the next 90 days, patient will: o Check blood sugar once daily, document, and provide at future appointments o Contact provider with any episodes of hypoglycemia  Heart Failure  Pharmacist  Clinical Goal(s) o Over the next 90 days, patient will work with PharmD and providers to reduce symptoms associated with heart failure  Current regimen:   Furosemide 52m 2AM and 1 PM  Losartan 255mdaily AM  Metoprolol succinate 2575maily AM  Spironolactone 65m6mily  Interventions: o Discussed importance of weighing  daily o Recommended patient to start increased furosemide dose as recommended per cardio o Recommended patient get prescription of spironolactone  Patient self care activities - Over the next 90 days, patient will: o Maintain heart failure medication regimen  Medication management  Pharmacist Clinical Goal(s): o Over the next 90 days, patient will work with PharmD and providers to achieve optimal medication adherence  Current pharmacy: [facility]  Interventions o Comprehensive medication review performed. o Utilize [facility] pharmacy for medication synchronization, packaging and delivery  Patient self care activities - Over the next 90 days, patient will: o Focus on medication adherence by filling and taking medications appropriately  o Take medications as prescribed o Report any questions or concerns to PharmD and/or provider(s)  Please see past updates related to this goal by clicking on the "Past Updates" button in the selected goal        Client verbalize knowledge of Heart Failure disease self management skills by 02/16/19      Continue to follow below recommendations:  Take your medications as prescribed.  Attend doctor appointments Review Congestive heart failure: Action plan in [employer] calendar         Client will increase activity tolerance within 3 months      Client admitted to the hospital on 01/15/20. Once discharged to home setting: Follow treatment plan and recommendations provided to your by home health therapy team.  Schedule follow up visits with your primary provider and/ or surgeon as recommended.  Continue to take prescribed pain medication as recommended by your doctor.        Client will report decrease surgical pain in 1-2 months      Client admitted to hospital on 01/15/20. Follow below recommendations once discharged to home:  Continue to take your pain medication as prescribed.  Follow up with your surgeon as recommended.  Notify  your surgeon of increase symptoms  Signs/ symptoms of infection from surgical area:  Fever, chills Redness Pus or Drainage Swelling Bad smell coming from the wound Increased pain Hot to touch      Client will report ongoing improvement in right foot wounds in 3 months.      Client admitted to the hospital 01/15/20 Continue ongoing follow up with home health as recommended by your providers Ongoing follow up with [facility] Report signs / symptoms of infection to your surgeon Schedule follow up appointment with your doctor as recommended          Client will verbalize reporting infection like symptoms of surgical site to surgeon in 3 months.      Report infection symptoms to your doctor:  Signs/ symptoms of infection from surgical area:  Fever, chills Redness Pus or Drainage Swelling Bad smell coming from the wound Increased pain Hot to touch      General - Client will not be readmitted within 30 days (C-SNP)      Please follow discharge instructions and call provider if you have any questions. Please attend all follow up appointments as scheduled. Please take your medications as prescribed. Please call 24 Hour nurse advice line as needed [phone]     HEMOGLOBIN A1C <  7      Per client recent A1c is 7.4 on 08/02/19 Continue to follow below recommendations:  Check blood sugars daily before eating with a goal of 80-130.  You can also check 1 1/2 hours after eating with goal of 180 or less Plan to eat low carbohydrate and low salt meals, watch portion sizes and avoid sugar sweetened drinks.   Review [employer] calendar sent in the mail for diabetes action plan.        Obtain annual screen for micro albuminuria (urine) , nephropathy (kidney problems)      No recent microalbumin noted in chart Discuss having an annual micro albuminuria screening done at your next follow up visit with your primary care provider.            Weight < 200 lb  (90.719 kg)      Client states she weighs daily and records the weight, she reports current weight is 207 lbs Client reports losing 81 lbs since last year (2020). Client reports her current weight fluctuates between 178 - 180 lbs.        PLAN RN care manager will continue to follow as client's CSNP care management coordinator. [employer] will resume care as of 03/01/2020.  Jacqlyn Larsen [facility], BSN [place], [place]

## 2020-02-25 NOTE — Progress Notes (Addendum)
Patient contacted me and left me a voicemail stating she has been admitted to the hospital. I returned the patient call. Ms. Alexis [name] stated she called her vascular surgeon last Wednesday evening, December 22nd, complaining of right leg and foot pain. She was told that she would be seen in the office By Dr. Rosana Hoes on Tuesday December 28th. She then went to the ED in [facility], but was not admitted.   On Thursday December 23rd, the patient submitted a message to the podiatrist requesting wound care. She shared with me her foot was hurting at that time, and she needed a dressing change.  On Friday, December 24th, the patient was admitted to [facility] via EMS.  She is currently admitted into the hospital and awaiting a response from the surgeon to see what her next steps are. She did ask that I assist her with obtaining her continuous glucose monitor system from her Endocrinologist.  Made outbound call to Endo. Nicki from Dr. Ronnald Ramp office to request [facility] receiver and sensor prescriptions to be sent to [facility]. I also verified with Nicki if the patient is utilizing the [facility], or the original. She stated she would submit my request to a nurse they would contact me with any questions.  Fanny Skates, [place] Pharmacist Assistant [phone]  6 minutes spent in review, coordination, and documentation.   Reviewed by: De Blanch, PharmD, BCACP Clinical Pharmacist [facility] [phone]

## 2020-02-27 ENCOUNTER — Telehealth: Payer: Self-pay | Admitting: Podiatry

## 2020-02-27 NOTE — Telephone Encounter (Signed)
Pt called and stated her vascular doctor stated that she has a black spot near her sx incision. She is very worried. They are telling her she might loose her leg. Patient is requesting Dr. Jacqualyn Posey to call her ASAP.

## 2020-02-27 NOTE — Telephone Encounter (Signed)
I called the patient, she is in the hospital at [facility]. States that the vascular surgeon states she is likely going to loose her leg. They are going to do one last surgery to try to save it. She states that she keeps getting "clots" in the leg. She states that the toe is looking OK. I provided reassurance and try to assure her they are doing everything they can to help her.

## 2020-02-28 NOTE — Telephone Encounter (Signed)
Ok thank you 

## 2020-02-29 ENCOUNTER — Encounter: Payer: Self-pay | Admitting: Medical

## 2020-03-02 DIAGNOSIS — I5032 Chronic diastolic (congestive) heart failure: Secondary | ICD-10-CM | POA: Diagnosis not present

## 2020-03-02 DIAGNOSIS — N186 End stage renal disease: Secondary | ICD-10-CM | POA: Diagnosis not present

## 2020-03-02 DIAGNOSIS — I132 Hypertensive heart and chronic kidney disease with heart failure and with stage 5 chronic kidney disease, or end stage renal disease: Secondary | ICD-10-CM | POA: Diagnosis not present

## 2020-03-02 DIAGNOSIS — Z4801 Encounter for change or removal of surgical wound dressing: Secondary | ICD-10-CM | POA: Diagnosis not present

## 2020-03-02 DIAGNOSIS — T8189XD Other complications of procedures, not elsewhere classified, subsequent encounter: Secondary | ICD-10-CM | POA: Diagnosis not present

## 2020-03-02 DIAGNOSIS — E1122 Type 2 diabetes mellitus with diabetic chronic kidney disease: Secondary | ICD-10-CM | POA: Diagnosis not present

## 2020-03-02 DIAGNOSIS — M62262 Nontraumatic ischemic infarction of muscle, left lower leg: Secondary | ICD-10-CM | POA: Diagnosis not present

## 2020-03-02 DIAGNOSIS — E1140 Type 2 diabetes mellitus with diabetic neuropathy, unspecified: Secondary | ICD-10-CM | POA: Diagnosis not present

## 2020-03-02 DIAGNOSIS — R262 Difficulty in walking, not elsewhere classified: Secondary | ICD-10-CM | POA: Diagnosis not present

## 2020-03-02 DIAGNOSIS — Z794 Long term (current) use of insulin: Secondary | ICD-10-CM | POA: Diagnosis not present

## 2020-03-03 ENCOUNTER — Ambulatory Visit: Payer: Self-pay | Admitting: Pharmacist

## 2020-03-03 ENCOUNTER — Telehealth: Payer: HMO

## 2020-03-03 NOTE — Chronic Care Management (AMB) (Signed)
  Chronic Care Management   Outreach Note  03/03/2020 Name: JAMMIE TROUP MRN: [id number] DOB: November 17, 1950  Referred by: Mackie Pai, PA-C Reason for referral : No chief complaint on file.   Third unsuccessful telephone outreach was attempted today. The patient was referred to the pharmacist for assistance with care management and care coordination.   Follow Up Plan:  -Attempt follow up at later time  De Blanch, PharmD, BCACP Clinical Pharmacist [facility] [phone]

## 2020-03-03 NOTE — Chronic Care Management (AMB) (Deleted)
Chronic Care Management Pharmacy  Name: Alexis [name]  MRN: [id number] DOB: 1951/01/16  Chief Complaint/ HPI  Alexis [name],  70 y.o. , female presents for their Follow-Up CCM visit with the clinical pharmacist via telephone due to COVID-19 Pandemic.  PCP : Mackie Pai, PA-C  Their chronic conditions include:  DM, CHF, PVD, CHF, AFib, HTN, Hypothyroid, Osteoarthritis, Gout, HLD  Office Visits: None since last CCM visit on 01/30/20.   Consult Visit: 01/29/20: Incision and drainage of post op wound inection - Left groin wound vac  01/08/20: Podiatry visit w/ Dr. Jacqualyn Posey - Debrided wound. Continue medihoney dressing every other day.   01/02/20: Endo visit w/ Peri Jefferson, PA-C - A1c 7.2% down from 7.4%. Continue Tresiba 14 units daily and Humalog TID per scale: BG < 200: 0 units BG 201-250: 2 units BG 251-300: 4 units BG 301-350: 6 units BG 351-400: 8 units BG 401-450: 10 units BG > 451: 12 units Will attempt to get patient Libre 2 CGM.   ED Visit:  02/22/20: [facility] - Critical limb ischemia and threatened bypass graft of the RLE RECOMMENDATIONS: 1. Patient with non oliguric AKI. No acute for dialysis. Serum creatinine stable around 2 mg/dl, with good urine output.  2.24 hours urine collection reports, calculated Cr Cl 20 ml/min 3. Patient will need PC to be remove prior to discharge.  4. Recommend to discharge on Lasix 40 mg oral BID  We will arrange for outpatient Nephrology follow up   PSU/Wound care: Recommendations:  - Continue BID wet to dry dressing changes to L groin and dry ABD dressing changes to R groin.  - Primary team to take updated EMR photos of the wound (last photos taken 01/31/20)  - Ensure appropriate bilateral groin hygiene with daily antibacterial soap and warm water washes - Recommend podiatry consult to establish foot care - Optimize nutrition with a high protein diet (2 g/kg/day protein), Vitamin C & zinc  supplementation, trend nutrition labs weekly, and a Nutrition consult if indicated - Optimize comorbid medical conditions that affect wound healing (maintain euglycemia, treat uremia/malnutrition/systemic infection if applicable, consult PT/OT for deconditioning/immobility if applicable)  Medications: Outpatient Encounter Medications as of 03/03/2020  Medication Sig Note  . acetaminophen (TYLENOL) 500 MG tablet Take by mouth.   Marland Kitchen albuterol (VENTOLIN HFA) 108 (90 Base) MCG/ACT inhaler Inhale 2 puffs into the lungs every 6 (six) hours as needed for wheezing or shortness of breath.   . allopurinol (ZYLOPRIM) 100 MG tablet Take 1 tablet (100 mg total) by mouth 2 (two) times daily.   . AMBULATORY NON FORMULARY MEDICATION Motorized scooter.  Diagnosis: Osteoarthritis M19.90 06/05/2019: Reports her scooter and has some mechanical issues and a technician is scheduled to come and look at it this week.  Marland Kitchen atorvastatin (LIPITOR) 10 MG tablet TAKE ONE TABLET BY MOUTH EVERY MORNING   . budesonide-formoterol (SYMBICORT) 160-4.5 MCG/ACT inhaler Inhale 2 puffs into the lungs 2 (two) times daily.   . clopidogrel (PLAVIX) 75 MG tablet TAKE ONE TABLET BY MOUTH EVERY EVENING   . Continuous Blood Gluc Receiver (FREESTYLE LIBRE 14 DAY READER) DEVI Use as directed to monitor BG.  Dx E11.65   . Continuous Blood Gluc Sensor (FREESTYLE LIBRE 14 DAY SENSOR) MISC INJECT INTO THE SKIN EVERY 14 DAYS   . fluticasone (FLONASE) 50 MCG/ACT nasal spray Place 2 sprays into both nostrils at bedtime. (Patient taking differently: Place 2 sprays into both nostrils in the morning and at bedtime. )   .  furosemide (LASIX) 20 MG tablet Take two tablets by mouth in the morning and one tablet by mouth at noon.   . gabapentin (NEURONTIN) 300 MG capsule Take 300 mg by mouth 3 (three) times daily. (Patient not taking: Reported on 01/01/2020) 01/01/2020: Developed tremors  . HYDROcodone-homatropine (HYCODAN) 5-1.5 MG/5ML syrup Take 5 mLs by mouth every  6 (six) hours as needed.   . Insulin Aspart FlexPen 100 UNIT/ML SOPN Inject 0-10 Units into the skin daily as needed (If glucose if over 200). Sliding scale   . insulin degludec (TRESIBA) 100 UNIT/ML FlexTouch Pen Inject 14 Units into the skin daily.  11/06/2019: Client state she takes 20 units daily  . Insulin Pen Needle (PEN NEEDLES 31GX5/16") 31G X 8 MM MISC Use as needed 4 times/day.  Dx E11.65   . levocetirizine (XYZAL) 5 MG tablet Take 1 tablet (5 mg total) by mouth every evening.   Marland Kitchen levofloxacin (LEVAQUIN) 500 MG tablet Take 1 tablet (500 mg total) by mouth daily.   Marland Kitchen levothyroxine (SYNTHROID) 150 MCG tablet Take 150 mcg by mouth daily before breakfast.    . lidocaine (XYLOCAINE) 4 % external solution Apply topically.   Marland Kitchen losartan (COZAAR) 25 MG tablet Take 1 tablet (25 mg total) by mouth daily.   . Melatonin 10 MG TABS Take 20 mg by mouth at bedtime.    . methocarbamol (ROBAXIN) 500 MG tablet 1 tab po bid   . metoprolol succinate (TOPROL-XL) 25 MG 24 hr tablet TAKE ONE (1) TABLET BY MOUTH EVERY DAY   . ondansetron (ZOFRAN-ODT) 4 MG disintegrating tablet Take 4 mg by mouth every 8 (eight) hours as needed for nausea or vomiting.    Glory Rosebush VERIO test strip 3 (three) times daily.   . Prenatal Vit-Fe Fumarate-FA (MULTIVITAMIN-PRENATAL) 27-0.8 MG TABS tablet Take 1 tablet by mouth daily.    . Probiotic CAPS Take 2 capsules by mouth daily. 50 billion (gummy)   . RABEprazole (ACIPHEX) 20 MG tablet Take 1 tablet (20 mg total) by mouth daily.   . sertraline (ZOLOFT) 50 MG tablet Take 1 tablet (50 mg total) by mouth daily.   Marland Kitchen spironolactone (ALDACTONE) 100 MG tablet Take 1 tablet (100 mg total) by mouth daily.   Marland Kitchen tobramycin (TOBREX) 0.3 % ophthalmic solution Place 2 drops into both eyes every 6 (six) hours.   . triamcinolone ointment (KENALOG) 0.1 % Apply to    No facility-administered encounter medications on file as of 03/03/2020.   Immunization History  Administered Date(s) Administered   . Fluad Quad(high Dose 65+) 11/08/2018  . Hepatitis B, adult 01/31/2019  . Hepatitis B, ped/adol 03/19/2019, 03/19/2019, 08/14/2019, 08/14/2019  . Influenza Split 01/05/2007, 12/23/2010, 01/03/2012, 05/11/2012  . Influenza, High Dose Seasonal PF 01/09/2015, 12/24/2015, 01/14/2017, 12/12/2017  . Influenza,inj,Quad PF,6+ Mos 12/24/2013, 01/09/2015  . Influenza-Unspecified 01/05/2007, 12/23/2010, 01/03/2012, 05/11/2012, 01/09/2015  . PFIZER SARS-COV-2 Vaccination 06/16/2019, 07/09/2019  . Pneumococcal Conjugate-13 01/09/2015  . Pneumococcal Polysaccharide-23 05/11/2012  . Td 12/23/2010  . Tdap 12/23/2010     Current Diagnosis/Assessment:  Goals Addressed   None    Patient is very confused about medication regimen.  Significant time spent addressing DM as that is the most pressing issue for the patient noting her diabetic foot infections and a1c >/= 8%.   Emergency Bypass surgery 09/2019  Has two grandkids (girl (76years old and boy)  Update 01/30/20 Patient currently still hospitalized. She is unsure when she will be discharged home. Strongly encouraged her to give me a call when  she is discharged to assure she has the proper medications delivered.   Update 03/03/20   Diabetes   Followed by Endo Peri Jefferson, PA-C)  A1c goal <7%   Problem Story During initial CCM visit in 04/2019 patient was only on bolus insulin per sliding scale with a1c 9% or greater. Coordinated care for patient to be seen by endo to start a basal/bolus regimen or pursue V-go insulin pump patient used in past. Patient was eventually started on basal/bolus regimen.  A1c has improved, but still above goal.   Recent Relevant Labs: Lab Results  Component Value Date/Time   HGBA1C 7.7 (H) 05/26/2019 04:49 PM   HGBA1C 9.0 04/03/2015 12:00 AM   GFR 57.41 (L) 05/23/2019 01:38 PM   GFR 52.36 (L) 04/12/2019 08:47 AM   MICROALBUR 0.3 04/10/2015 10:40 AM    Last diabetic Eye exam:  Lab Results  Component  Value Date/Time   HMDIABEYEEXA No Retinopathy 04/13/2017 12:00 AM    Last diabetic Foot exam: No results found for: HMDIABFOOTEX   Checking BG: Daily with Freestyle Libre  Patient is currently uncontrolled on the following medications:   Tresiba 14 units daily  Novolog 4-10 units before meals   Last diabetic Eye exam:  Lab Results  Component Value Date/Time   HMDIABEYEEXA No Retinopathy 04/13/2017 12:00 AM    Last diabetic Foot exam: No results found for: HMDIABFOOTEX   We discussed:  Importance of having a basal/bolus regimen or getting set up with V-go insulin pump to gain better DM control    Update 01/01/20 No specific readings to report.   Reports Novolog Scale to be the following 200: 0 units 201-250: 2 units 251-300: 4 units 301-350: 6 units 351-400: 8 units 401-450: 10 units  Update 01/30/20 No specific readings to report as patient is still in hospital. Note her a1c is now 7.2% as of 01/02/20.  Will continue to follow.  Update 03/03/20  Plan -Continue current medications -Follow up with Autumn Jones  Heart Failure   Type: Combined Systolic and Diastolic  Last ejection fraction: 09/2018 40%  Patient has failed these meds in past: ramipril (low bp, renal function fluctuation?) Patient is currently on the following medications:   Furosemide 47m 2AM and 1 PM  Losartan 230mdaily AM  Metoprolol succinate 2558maily AM  Spironolactone 39m60mily (Doesn't have at home currently)  Gained 20lbs over two days about 2-3 weekends ago. Resolved after doing usual regimen and being more strict about her diet.   She has developed 10lb weight gain again and was told to start regimen of furosemide 40mg49mce daily Encouraged patient to start regiment of increased furosemide and encouraged her to receive refill of spironolactone  Update 01/30/20 Need to follow closely to assure patient has necessary medications and to prevent readmission.   Update  03/03/20  Plan -Continue current medications  Hypertension   BP goal is:  <130/80  Office blood pressures are  BP Readings from Last 3 Encounters:  02/18/20 136/64  12/24/19 128/60  10/05/19 124/72   Patient checks BP at home infrequently Patient home BP readings are ranging: Unable to assess  Patient has failed these meds in the past: ramipril (low bp, renal function fluctuation?) Patient is currently controlled on the following medications:  . Losartan 25mg 53my . Metoprolol Succinate 25mg d38m  . Spironolactone 25mg da22m(does not currently have)  We discussed  BP goal    Update 01/30/20 No specific readings to provide. Will continue to monitor  Update 03/03/20  Plan -Check BP 2-3 times per week and record -Continue current medications    Hyperlipidemia   LDL goal < 70  Last lipids Lab Results  Component Value Date   CHOL 93 (L) 05/10/2018   HDL 31 (L) 05/10/2018   LDLCALC 33 05/10/2018   LDLDIRECT 111.0 04/10/2015   TRIG 147 05/10/2018   CHOLHDL 3.0 05/10/2018   Hepatic Function Latest Ref Rng & Units 09/18/2019 05/25/2019 05/23/2019  Total Protein 6.0 - 8.5 g/dL 6.9 7.4 7.4  Albumin 3.8 - 4.8 g/dL 3.7(L) 3.7 4.0  AST 0 - 40 IU/L 47(H) 163(H) 78(H)  ALT 0 - 32 IU/L 27 135(H) 69(H)  Alk Phosphatase 48 - 121 IU/L 297(H) 218(H) 256(H)  Total Bilirubin 0.0 - 1.2 mg/dL 0.9 1.1 0.9  Bilirubin, Direct 0.00 - 0.40 mg/dL - - -     The ASCVD Risk score Mikey Bussing DC Jr., et al., 2013) failed to calculate for the following reasons:   The patient has a prior MI or stroke diagnosis   Patient has failed these meds in past: None noted  Patient is currently controlled on the following medications:  . Atorvastatin 45m daily AM   Update 01/30/20 No complaints with regimen. Will assure adherence.  Update 03/03/20  Plan -Continue current medications   Hypothyroidism   Followed by Endo (Peri Jefferson PA-C)  Lab Results  Component Value Date/Time   TSH 2.09  03/15/2017 12:05 PM   TSH 15.50 (H) 12/16/2016 10:06 AM   FREET4 0.79 04/06/2016 01:57 AM    Patient has failed these meds in past: None noted  Patient is currently controlled on the following medications:  . Levothyroxine 1585m daily  Update 01/30/20 Would benefit from updated TSH?  Update 03/03/20  Plan -Continue current medications   Depression   Will assess regimen and medication options further at next visit Significant time spent on other portions of the visit.   [facility]  Decreased Interest 3  Down, Depressed, Hopeless 0  PHQ - 2 Score 3  Altered sleeping 3  Tired, decreased energy 0  Change in appetite 0  Feeling bad or failure about yourself  0  Trouble concentrating 3  Moving slowly or fidgety/restless 0  Suicidal thoughts 0  PHQ-9 Score 9  Some recent data might be hidden    Patient has failed these meds in past: amitriptyline, venlafaxine, desvenlafaxine, buspirone (listed in D/C meds. No apparent reason for D/C), duloxetine (intoleration) Patient is currently uncontrolled on the following medications:  . Sertraline 5045maily (prescribed, but not taking)  Update 01/30/20 Will assess when patient is home  Update 03/03/20  Plan -Discuss options at next visit   GERD   Patient has failed these meds in past: None noted  Patient is currently controlled on the following medications:  . Rabeprazole 54m44mice daily  Breakthrough Sx: No  States new GI was told her she needed to get an EGD, but she is unsure if would like to complete this. States she has to get GI banding every 2 months  Pt states she does not have this medication at home. Noting GI history encouraged patient to get his refilled.  Update 01/30/20 Need to assure patient has received refill when home.  Update 03/03/20   Plan -Obtain refill of rabeprazole   Medication Management   Pt uses [facility] for all medications  Patient admits keeping up  with her medications is confusing and would appreciate adherence packaging, synchronization, and  delivery.  We discussed: Verbal consent obtained for [facility] enhanced pharmacy services (medication synchronization, adherence packaging, delivery coordination). A medication sync plan was created to allow patient to get all medications delivered once every 30 to 90 days per patient preference. Patient understands they have freedom to choose pharmacy and clinical pharmacist will coordinate care between all prescribers and [facility].  Update 01/30/20 Will need to follow up when patient is discharged home.  Update 03/03/20   Plan  Utilize [facility] for medication synchronization, packaging and delivery   Follow up:  1 week phone visit  Miscellaneous Meds Prenatal vitamin Metamucil  Ondansetron Mupirocin  lidocaine Dicloxacillin  De Blanch, PharmD, BCACP Clinical Pharmacist [facility] [phone]

## 2020-03-04 ENCOUNTER — Other Ambulatory Visit: Payer: Self-pay

## 2020-03-06 ENCOUNTER — Ambulatory Visit: Payer: Self-pay | Admitting: Pharmacist

## 2020-03-06 NOTE — Chronic Care Management (AMB) (Addendum)
[facility] notified me of contact by patient's [facility] nurse, Jackelyn Poling stating that patient is to be on Eliquis 73m twice daily and that the patient's furosemide dose has been increased to 457mtwice daily.   Patient's most recent CCM visit was 01/30/20. Since then patient has been hospitalized or admitted to facilities where she was receiving her medications. Encouraged patient to contact me upon discharge for medication reconciliation. Pt verbalized understanding and agreed.  IvErmalinda BarriosCCMantonssistant, has also reached out to patient to coordinate medication needs.  Attempted to call patient on 03/03/20 to discuss medication reconciliation per her most recent discharge, but patient did not answer.  Of note, Eliquis is not listed in patient's current medication list at PCP office nor did patient mention taking this medication when reviewing her medications at prior visits.  Want to assure patient is receiving the appropriately prescribed medications per her most recent discharge.   Review of most recent discharge with pertinent information regarding medications:  Admit date: 02/22/20 Discharge date: 02/26/20  General Discharge "In August of this year she underwent a right femoral endarterectomy and right femoral to PT artery bypass with non-reversed GSV for critical limb ischemia manifested as tissue loss as well as a left groin I&D and VAC placement for left surgical site infection. She was discharged on IV VANC with iHD for groin infection x 14 days. 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  She was admitted to the vascular surgery service on 12/24 for eliquis washout and planned endovascular intervention of the RLE bypass graft. She is now POD#3 s/p arteriogram via left brachial artery and angioplasty of the right lower extremity bypass graft. Post-angioplasty demonstrated improvement in stenosis but bypass remains high risk for failure. Remains on Eliquis for bypass  patency.  RECOMMENDATIONS: 1. Patient with non oliguric AKI. No acute for dialysis. Serum creatinine stable around 2 mg/dl, with good urine output.  2.24 hours urine collection reports, calculated Cr Cl 20 ml/min 3. Patient will need PC to be remove prior to discharge.  4. Recommend to discharge on Lasix 40 mg oral BID  Per Nephrology Please continue with lasix 40 mg po bid for discharge, decrease dose to 40 mg po daily if weight is less than 210 pounds, call our office if continue to gain weight while on current dose Advised on salt and water restriction  Follow up in 1-2 weeks in our office  Permcath removal today   Medications at Time of Discharge - documented as of this encounter  Medication Sig Dispensed Refills Start Date End Date  albuterol (PROVENTIL HFA) 90 mcg/actuation inhaler   Inhale 2 puffs into the lungs every 4 (four) hours as needed for Wheezing or Shortness of Breath.    0 03/15/2017    allopurinoL (ZYLOPRIM) 100 MG tablet   Take 1 tablet (100 mg total) by mouth daily.   0 02/01/2020    apixaban (ELIQUIS) 5 mg tablet *ANTICOAGULANT*   Indications: Post embolectomy - left fem-pop-pt Take 1 tablet (5 mg total) by mouth 2 times daily. Start date: 01/25/20 60 tablet   2 02/01/2020 05/01/2020  atorvastatin (LIPITOR) 10 MG tablet   Take 1 tablet (10 mg total) by mouth daily at 6pm. 90 tablet   3 02/01/2020    B complex-vitamin C-folic acid (NEPHRO-VITE) 0.8 mg Tab tablet   Take 1 tablet by mouth daily. 90 tablet   0 02/01/2020 05/01/2020  blood sugar diagnostic (ONETOUCH VERIO TEST STRIPS) Strp test strips   Check BG 3 times/Samanthajo Payano.  Dx E11.65 100 each   10 02/11/2020    blood-glucose meter (ONETOUCH ULTRAMINI) kit   Use as instructed. DX: E11.65 1 each   1 02/11/2020    budesonide-formoteroL (SYMBICORT) 160-4.5 mcg/actuation inhaler   Inhale 2 puffs into the lungs 2 times daily.   0      clopidogreL (PLAVIX) 75 mg tablet *ANTIPLATELET*   Take 1 tablet (75 mg total) by mouth daily.  30 tablet   3 02/28/2020    flash glucose scanning reader (FREESTYLE LIBRE 14 Bodie Abernethy READER)   Use as directed to monitor BG. Dx E11.65 1 each   1 02/25/2020    fluticasone propionate (FLONASE) 50 mcg/actuation nasal spray   2 sprays by Nasal route 2 (two) times daily as needed for Rhinitis or Allergies.    0 07/31/2016    furosemide (LASIX) 20 MG tablet   Take 40 mg by mouth 2 times daily.    0      insulin aspart (NOVOLOG FLEXPEN U-100 INSULIN SUBQ)   Per patient: Inject as directed with meals using sliding scale for BS readings over 200 (2-6 units corrective dosing)   0      insulin degludec (TRESIBA FLEXTOUCH) 100 unit/mL (3 mL) injection   Inject 20 Units into the skin daily. Inject as directed per sliding scale. MDD: 50 units/Yvaine Jankowiak 15 mL   2 02/11/2020    lancets (ONETOUCH DELICA PLUS LANCET) 33 gauge Misc   Check BG 3 times/Tadarius Maland. Dx E11.65 100 each   10 02/11/2020    levocetirizine (XYZAL) 5 MG tablet   Take 5 mg by mouth every evening.    0 02/20/2019    levothyroxine (SYNTHROID) 150 MCG tablet   Indications: Acquired hypothyroidism TAKE ONE TABLET BY MOUTH BEFORE BREAKFAST 90 tablet   3 02/20/2020    lidocaine HCL 4 % lqro   Apply 10 mLs topically nightly. neuropathy    0 03/18/2019    magnesium oxide (MAG-OX) 400 mg (241.3 mg magnesium) tablet   Take 1 tablet (400 mg total) by mouth 2 times daily.   0 02/28/2020    metoPROLOL succinate (TOPROL-XL) 25 MG 24 hr tablet   Take 25 mg by mouth daily.    0 03/25/2015    midodrine (PROAMATINE) 10 MG tablet   Take 10 mg by mouth Every Monday, Wednesday, Friday.   0      pen needle, diabetic (PEN NEEDLE) 31 gauge x 5/16" Ndle   Use as needed 4 times/Saul Dorsi. Dx E11.65 200 each   1 02/11/2020    RABEprazole (ACIPHEX) 20 mg tablet   Take 20 mg by mouth daily.    0 01/15/2019    triamcinolone (KENALOG) 0.1 % cream   Apply 1 application topically daily as needed (rash).   0      HYDROcodone-acetaminophen (NORCO) 5-325 mg per tablet   Take 1 tablet by mouth every  6 (six) hours as needed for up to 5 days. 20 tablet   0 02/28/2020 03/04/2020  flash glucose sensor kit (FREESTYLE LIBRE 14 Latreshia Beauchaine SENSOR)   Inject into the skin every 14 days. 2 kit   11 02/25/2020 03/03/2020   Per Dispense Report  Eliquis 61m tab 02/01/20 #28 14 DS MSea Breezepatient and she states she has been taking furosemide 472mAM and 2070mt lunch She states she is weighing daily and is now down to 212lbs. She states she was 237 when admitted. Discussed nephrology recommendation for furosemide 66m50mce  daily if weight less than 210lbs Noting patient's downward weight trend will have patient continue on current dose (prescribed by Dr. Agustin Cree) with possibility of moving down to furosemide 63m once daily if weight less than 210lbs Noting discharge medication list will assure patient has eliquis 578mtwice daily dispensed.  She states she is supposed to hold eliquis for removal of her port cath on 03/12/20.   [facility] Had patient confirm her medication packages with me  Before Breakfast package (pt not taking and supplementing with other levothyroxine): Sertraline 5068mevothyroxine 150m56mBreakfast package #1 Allopurinol 100mg34mrvastatin 10mg 19mosemide 20mg (43mosartan 25mg  M56mcarbamol 500mg   B14mfast package #2 Metoprolol Succinate 25mg  Rab96mzole 20mg  Spir63mactone 100mg   Lunc45mrosemide 20mg   Dinne73mlopurinol Clopidogrel Levocetirizine Methocarbamol  Noted medications needed since discharge apixaban (ELIQUIS) 5 mg tablet *ANTICOAGULANT* Take 1 tablet (5 mg total) by mouth 2 times daily.  To be dispensed from [facility] B complex-vitamin C-folic acid (NEPHRO-VITE) 0.8 mg Tab tablet Take 1 tablet by mouth daily. Still needed? No longer on dialysis and port cath removal on 03/12/20 magnesium oxide (MAG-OX) 400 mg (241.3 mg magnesium) tablet Take 1 tablet (400 mg total) by mouth 2 times daily.  Still needed?  Indication? Lidocaine 4%  Pt gives a roll on from [facility] midodrine (PROAMATINE) 10 MG tablet Take 10 mg by mouth Every Monday, Wednesday, Friday.  Still needed? Normotensive? fluticasone propionate (FLONASE) 50 mcg/actuation nasal spray 2 sprays by Nasal route 2 (two) times daily as needed for Rhinitis or Allergies.  Pt states she doesn't need triamcinolone (KENALOG) 0.1 % cream Apply 1 application topically daily as needed (rash).  Pt states she doesn't need     Plan -Have Eliquis 5mg twice dai1mdispensed in vial on 03/07/20. (Will package once synced)  -Update PCP med list to include Eliquis -Assess patient need of furosemide dose noting pt's weight is decreasing at dose of 40mg AM and 2080munch as p18mged. Current weight is 212lbs. Nephrology instruction to move to furosemide 40mg once daily 14m weight less than 210lbs.  -Assess need for the following medications as noted above  Nephrovite  Midodrine  Magnesium Oxide  Significant time spent in chart review, coordination with Debbie, pharmacy,Jackelyn Polingnt, and documentation.  Eastyn Dattilo, PharDe Blanchlinical Pharmacist [phone] reviewed this encounter including the documentation in this note and have collaborated with the care management provider regarding care management and care coordination activities to include development and update of the comprehensive care plan. I am certifying that I agree with the content of this note and encounter as supervising provider.  Edward Saguier, PMackie Pai

## 2020-03-07 ENCOUNTER — Telehealth: Payer: Self-pay | Admitting: Pharmacist

## 2020-03-07 ENCOUNTER — Telehealth: Payer: Self-pay | Admitting: Medical

## 2020-03-07 ENCOUNTER — Ambulatory Visit: Payer: HMO | Admitting: Podiatry

## 2020-03-07 NOTE — Progress Notes (Addendum)
Chronic Care Management Pharmacy Assistant   Name: Alexis [name]  MRN: [id number] DOB: Apr 05, 1950  Reason for Encounter: Medication Request From Specialist   PCP : Mackie Pai, PA-C  Allergies:   Allergies  Allergen Reactions   Indomethacin Other (See Comments)    Renal Insufficiency   Pregabalin Other (See Comments)    DIZZINESS    Sulfa Antibiotics Hives   Clindamycin/Lincomycin    Morphine And Related Nausea And Vomiting    Medications: Outpatient Encounter Medications as of 03/07/2020  Medication Sig Note   acetaminophen (TYLENOL) 500 MG tablet Take by mouth.    albuterol (VENTOLIN HFA) 108 (90 Base) MCG/ACT inhaler Inhale 2 puffs into the lungs every 6 (six) hours as needed for wheezing or shortness of breath.    allopurinol (ZYLOPRIM) 100 MG tablet Take 1 tablet (100 mg total) by mouth 2 (two) times daily.    AMBULATORY NON FORMULARY MEDICATION Motorized scooter.  Diagnosis: Osteoarthritis M19.90 06/05/2019: Reports her scooter and has some mechanical issues and a technician is scheduled to come and look at it this week.   atorvastatin (LIPITOR) 10 MG tablet TAKE ONE TABLET BY MOUTH EVERY MORNING    budesonide-formoterol (SYMBICORT) 160-4.5 MCG/ACT inhaler Inhale 2 puffs into the lungs 2 (two) times daily.    clopidogrel (PLAVIX) 75 MG tablet TAKE ONE TABLET BY MOUTH EVERY EVENING    Continuous Blood Gluc Receiver (FREESTYLE LIBRE 14 DAY READER) DEVI Use as directed to monitor BG.  Dx E11.65    Continuous Blood Gluc Sensor (FREESTYLE LIBRE 14 DAY SENSOR) MISC INJECT INTO THE SKIN EVERY 14 DAYS    ELIQUIS 5 MG TABS tablet Take 5 mg by mouth 2 (two) times daily.    fluticasone (FLONASE) 50 MCG/ACT nasal spray Place 2 sprays into both nostrils at bedtime. (Patient taking differently: Place 2 sprays into both nostrils in the morning and at bedtime. )    furosemide (LASIX) 20 MG tablet Take two tablets by mouth in the morning and one tablet by mouth at noon.     gabapentin (NEURONTIN) 300 MG capsule Take 300 mg by mouth 3 (three) times daily. (Patient not taking: Reported on 01/01/2020) 01/01/2020: Developed tremors   HYDROcodone-homatropine (HYCODAN) 5-1.5 MG/5ML syrup Take 5 mLs by mouth every 6 (six) hours as needed.    Insulin Aspart FlexPen 100 UNIT/ML SOPN Inject 0-10 Units into the skin daily as needed (If glucose if over 200). Sliding scale    insulin degludec (TRESIBA) 100 UNIT/ML FlexTouch Pen Inject 14 Units into the skin daily.  11/06/2019: Client state she takes 20 units daily   Insulin Pen Needle (PEN NEEDLES 31GX5/16") 31G X 8 MM MISC Use as needed 4 times/day.  Dx E11.65    levocetirizine (XYZAL) 5 MG tablet Take 1 tablet (5 mg total) by mouth every evening.    levofloxacin (LEVAQUIN) 500 MG tablet Take 1 tablet (500 mg total) by mouth daily.    levothyroxine (SYNTHROID) 150 MCG tablet Take 150 mcg by mouth daily before breakfast.     lidocaine (XYLOCAINE) 4 % external solution Apply topically.    losartan (COZAAR) 25 MG tablet Take 1 tablet (25 mg total) by mouth daily.    Melatonin 10 MG TABS Take 20 mg by mouth at bedtime.     methocarbamol (ROBAXIN) 500 MG tablet 1 tab po bid    metoprolol succinate (TOPROL-XL) 25 MG 24 hr tablet TAKE ONE (1) TABLET BY MOUTH EVERY DAY    ondansetron (ZOFRAN-ODT) 4 MG  disintegrating tablet Take 4 mg by mouth every 8 (eight) hours as needed for nausea or vomiting.     ONETOUCH VERIO test strip 3 (three) times daily.    Prenatal Vit-Fe Fumarate-FA (MULTIVITAMIN-PRENATAL) 27-0.8 MG TABS tablet Take 1 tablet by mouth daily.     Probiotic CAPS Take 2 capsules by mouth daily. 50 billion (gummy)    RABEprazole (ACIPHEX) 20 MG tablet Take 1 tablet (20 mg total) by mouth daily.    sertraline (ZOLOFT) 50 MG tablet Take 1 tablet (50 mg total) by mouth daily. (Patient not taking: No sig reported) 03/06/2020: Patient states made her sick. Stopped taking on 03/04/20   spironolactone (ALDACTONE) 100 MG tablet Take 1 tablet  (100 mg total) by mouth daily.    tobramycin (TOBREX) 0.3 % ophthalmic solution Place 2 drops into both eyes every 6 (six) hours.    triamcinolone ointment (KENALOG) 0.1 % Apply to    No facility-administered encounter medications on file as of 03/07/2020.    Current Diagnosis: Patient Active Problem List   Diagnosis Date Noted   Depression 05/26/2019   Cellulitis 05/26/2019   Other cirrhosis of liver ([place]) 65/79/0383   Chronic systolic CHF (congestive heart failure) ([place]) 05/26/2019   Cellulitis in diabetic foot ([place]) 05/25/2019   Epigastric pain    Idiopathic esophageal varices without bleeding ([place])    Anemia due to chronic blood loss    Paroxysmal atrial fibrillation ([place]) 09/26/2018   Macrocytic anemia 09/04/2018   Pacemaker 08/25/2018   Insulin dependent diabetes mellitus with complications 33/83/2919   Constipation 08/25/2018   Fluid overload 08/25/2018   Chronic anticoagulation 08/25/2018   Morbid obesity ([place]) 08/25/2018   Chronic diastolic heart failure ([place]) 05/17/2018   Acute bronchitis with COPD ([place]) 01/16/2018   Acquired absence of other right toe(s) ([place]) 11/16/2017   Type 2 diabetes mellitus with foot ulcer ([place]) 11/16/2017   Crush injury of left foot, initial encounter 11/28/2016   Osteomyelitis ([place]) 07/30/2016   Gangrene of foot ([place]) 04/27/2016   Thrombocytopenia ([place])    Vomiting and diarrhea 04/05/2016   Hyperlipidemia 11/18/2015   Gout 08/05/2014   Cough 08/05/2014   Allergic rhinitis 07/12/2014   Osteoarthritis 07/12/2014   Asthma 07/12/2014   Congestive heart failure ([place]) 07/12/2014   Permanent atrial fibrillation ([place]) 07/12/2014   HTN (hypertension) 07/12/2014   Hypothyroidism 07/12/2014   History of substance abuse ([place]) 07/12/2014   Peripheral vascular disease ([place]) 04/02/2013   Discoloration of skin 04/02/2013    Goals Addressed   None    Made outbound call to Dr. Jimmye Norman office at [phone] to request Eliquis 5 mg tab  prescription. Spoke with Claiborne Billings who stated patient was to start Eliquis on December the 3rd. It was sent to [place] at [facility]. I requested that this medication be sent to [facility], but Claiborne Billings stated she was unable to fulfill my request. The medication could be transferred from [facility] to [facility], but she was not able to send a new prescription. Relayed this information to [facility] pharmacy for further assistance.   Follow-Up:  Pharmacist Review   Fanny Skates, [place] Pharmacist Assistant [phone]  Will follow up with pharmacy to assure Eliquis sent to patient.   Reviewed by: De Blanch, PharmD, BCACP Clinical Pharmacist [facility] [phone]

## 2020-03-07 NOTE — Telephone Encounter (Signed)
Caller: Carter Kitten [facility]) Call back # [phone]  Lattie Haw calling to notify you patient qualify for extra home health help. Please fill the custodial care benefits form (on your box).

## 2020-03-10 ENCOUNTER — Telehealth: Payer: Self-pay | Admitting: Medical

## 2020-03-10 NOTE — Telephone Encounter (Signed)
Form placed in Edward's box

## 2020-03-10 NOTE — Telephone Encounter (Signed)
Pt needs in office appointment so can get urine study, culture, discuss pain and other medical issues. Give her 40 minutes. Tuesday or wed.

## 2020-03-10 NOTE — Telephone Encounter (Signed)
Caller: Danae Chen ([facility]) Call back # [phone]  When by patient house today patient is reporting pain 8 of 10. Patient report needing a refill on pain medication (nurse did not have name). Patient reports burning with urination. Danae Chen states to please call her and the patient back

## 2020-03-11 ENCOUNTER — Other Ambulatory Visit (INDEPENDENT_AMBULATORY_CARE_PROVIDER_SITE_OTHER): Payer: HMO

## 2020-03-11 ENCOUNTER — Other Ambulatory Visit: Payer: Self-pay

## 2020-03-11 ENCOUNTER — Telehealth: Payer: Self-pay | Admitting: Medical

## 2020-03-11 ENCOUNTER — Telehealth: Payer: Self-pay | Admitting: Pharmacist

## 2020-03-11 DIAGNOSIS — R300 Dysuria: Secondary | ICD-10-CM

## 2020-03-11 LAB — POC URINALSYSI DIPSTICK (AUTOMATED)
Bilirubin, UA: NEGATIVE
Glucose, UA: NEGATIVE
Ketones, UA: NEGATIVE
Nitrite, UA: NEGATIVE
Protein, UA: NEGATIVE
Spec Grav, UA: 1.015 (ref 1.010–1.025)
Urobilinogen, UA: 0.2 E.U./dL
pH, UA: 7 (ref 5.0–8.0)

## 2020-03-11 MED ORDER — NITROFURANTOIN MONOHYD MACRO 100 MG PO CAPS: 100.0000 mg | ORAL_CAPSULE | Freq: Two times a day (BID) | ORAL | 0 refills | Status: DC

## 2020-03-11 NOTE — Progress Notes (Addendum)
Chronic Care Management Pharmacy Assistant   Name: MELE SYLVESTER  MRN: [id number] DOB: 02-08-1951  Reason for Encounter: Patient Phone Call   PCP : Mackie Pai, PA-C  Allergies:   Allergies  Allergen Reactions   Indomethacin Other (See Comments)    Renal Insufficiency   Pregabalin Other (See Comments)    DIZZINESS    Sulfa Antibiotics Hives   Clindamycin/Lincomycin    Morphine And Related Nausea And Vomiting    Medications: Outpatient Encounter Medications as of 03/11/2020  Medication Sig Note   acetaminophen (TYLENOL) 500 MG tablet Take by mouth.    albuterol (VENTOLIN HFA) 108 (90 Base) MCG/ACT inhaler Inhale 2 puffs into the lungs every 6 (six) hours as needed for wheezing or shortness of breath.    allopurinol (ZYLOPRIM) 100 MG tablet Take 1 tablet (100 mg total) by mouth 2 (two) times daily.    AMBULATORY NON FORMULARY MEDICATION Motorized scooter.  Diagnosis: Osteoarthritis M19.90 06/05/2019: Reports her scooter and has some mechanical issues and a technician is scheduled to come and look at it this week.   atorvastatin (LIPITOR) 10 MG tablet TAKE ONE TABLET BY MOUTH EVERY MORNING    budesonide-formoterol (SYMBICORT) 160-4.5 MCG/ACT inhaler Inhale 2 puffs into the lungs 2 (two) times daily.    clopidogrel (PLAVIX) 75 MG tablet TAKE ONE TABLET BY MOUTH EVERY EVENING    Continuous Blood Gluc Receiver (FREESTYLE LIBRE 14 DAY READER) DEVI Use as directed to monitor BG.  Dx E11.65    Continuous Blood Gluc Sensor (FREESTYLE LIBRE 14 DAY SENSOR) MISC INJECT INTO THE SKIN EVERY 14 DAYS    ELIQUIS 5 MG TABS tablet Take 5 mg by mouth 2 (two) times daily.    fluticasone (FLONASE) 50 MCG/ACT nasal spray Place 2 sprays into both nostrils at bedtime. (Patient taking differently: Place 2 sprays into both nostrils in the morning and at bedtime. )    furosemide (LASIX) 20 MG tablet Take two tablets by mouth in the morning and one tablet by mouth at noon.    gabapentin (NEURONTIN)  300 MG capsule Take 300 mg by mouth 3 (three) times daily. (Patient not taking: Reported on 01/01/2020) 01/01/2020: Developed tremors   HYDROcodone-homatropine (HYCODAN) 5-1.5 MG/5ML syrup Take 5 mLs by mouth every 6 (six) hours as needed.    Insulin Aspart FlexPen 100 UNIT/ML SOPN Inject 0-10 Units into the skin daily as needed (If glucose if over 200). Sliding scale    insulin degludec (TRESIBA) 100 UNIT/ML FlexTouch Pen Inject 14 Units into the skin daily.  11/06/2019: Client state she takes 20 units daily   Insulin Pen Needle (PEN NEEDLES 31GX5/16") 31G X 8 MM MISC Use as needed 4 times/day.  Dx E11.65    levocetirizine (XYZAL) 5 MG tablet Take 1 tablet (5 mg total) by mouth every evening.    levofloxacin (LEVAQUIN) 500 MG tablet Take 1 tablet (500 mg total) by mouth daily.    levothyroxine (SYNTHROID) 150 MCG tablet Take 150 mcg by mouth daily before breakfast.     lidocaine (XYLOCAINE) 4 % external solution Apply topically.    losartan (COZAAR) 25 MG tablet Take 1 tablet (25 mg total) by mouth daily.    Melatonin 10 MG TABS Take 20 mg by mouth at bedtime.     methocarbamol (ROBAXIN) 500 MG tablet 1 tab po bid    metoprolol succinate (TOPROL-XL) 25 MG 24 hr tablet TAKE ONE (1) TABLET BY MOUTH EVERY DAY    ondansetron (ZOFRAN-ODT) 4 MG disintegrating  tablet Take 4 mg by mouth every 8 (eight) hours as needed for nausea or vomiting.     ONETOUCH VERIO test strip 3 (three) times daily.    Prenatal Vit-Fe Fumarate-FA (MULTIVITAMIN-PRENATAL) 27-0.8 MG TABS tablet Take 1 tablet by mouth daily.     Probiotic CAPS Take 2 capsules by mouth daily. 50 billion (gummy)    RABEprazole (ACIPHEX) 20 MG tablet Take 1 tablet (20 mg total) by mouth daily.    sertraline (ZOLOFT) 50 MG tablet Take 1 tablet (50 mg total) by mouth daily. (Patient not taking: No sig reported) 03/06/2020: Patient states made her sick. Stopped taking on 03/04/20   spironolactone (ALDACTONE) 100 MG tablet Take 1 tablet (100 mg total) by mouth  daily.    tobramycin (TOBREX) 0.3 % ophthalmic solution Place 2 drops into both eyes every 6 (six) hours.    triamcinolone ointment (KENALOG) 0.1 % Apply to    No facility-administered encounter medications on file as of 03/11/2020.    Current Diagnosis: Patient Active Problem List   Diagnosis Date Noted   Depression 05/26/2019   Cellulitis 05/26/2019   Other cirrhosis of liver ([place]) 58/52/7782   Chronic systolic CHF (congestive heart failure) ([place]) 05/26/2019   Cellulitis in diabetic foot ([place]) 05/25/2019   Epigastric pain    Idiopathic esophageal varices without bleeding ([place])    Anemia due to chronic blood loss    Paroxysmal atrial fibrillation ([place]) 09/26/2018   Macrocytic anemia 09/04/2018   Pacemaker 08/25/2018   Insulin dependent diabetes mellitus with complications 42/35/3614   Constipation 08/25/2018   Fluid overload 08/25/2018   Chronic anticoagulation 08/25/2018   Morbid obesity ([place]) 08/25/2018   Chronic diastolic heart failure ([place]) 05/17/2018   Acute bronchitis with COPD ([place]) 01/16/2018   Acquired absence of other right toe(s) ([place]) 11/16/2017   Type 2 diabetes mellitus with foot ulcer ([place]) 11/16/2017   Crush injury of left foot, initial encounter 11/28/2016   Osteomyelitis ([place]) 07/30/2016   Gangrene of foot ([place]) 04/27/2016   Thrombocytopenia ([place])    Vomiting and diarrhea 04/05/2016   Hyperlipidemia 11/18/2015   Gout 08/05/2014   Cough 08/05/2014   Allergic rhinitis 07/12/2014   Osteoarthritis 07/12/2014   Asthma 07/12/2014   Congestive heart failure ([place]) 07/12/2014   Permanent atrial fibrillation ([place]) 07/12/2014   HTN (hypertension) 07/12/2014   Hypothyroidism 07/12/2014   History of substance abuse ([place]) 07/12/2014   Peripheral vascular disease ([place]) 04/02/2013   Discoloration of skin 04/02/2013    Goals Addressed   None    Contacted Vascular surgeons office and spoke with Whitney. Inquired if their office had any record of Lovenox  injections. She stated a NP ordered Levonox 100 mg fom 1-9 to 1-12. There are not updated notes about an injection prior to her dialysis catheter removal on 1-12.   Received a call back from [place] at V[facility] Surgery. She stated she was unable to confirm who manages the Lovenox. She then saw an order placed for the Levonox to take Before breakfast for three days prior to catheter removal by Rich Reining NP with [facility]  who also works with Dr. Jene Every. Called the number provided by Jackelyn Poling [phone]. Unable to make contact. [facility]  Contacted the patient to inquire if she had been taking Lovenox prior today. She said she never picked it up from the pharmacy, and it hadn't been taking it. She said someone at the office where she is getting her catheter removed (radiology) advised her to disregard the Lovenox  medication at this point. Confirmed with the patient she is still having the catheter removed tomorrow.   Follow-Up:  Pharmacist Review   Fanny Skates, [facility] Pharmacist Assistant [phone]  Patient initially called me and inquired of Lovenox prescription. Reviewed chart and did not see where this was ordered or sent to [facility]. Informed patient I would get Ermalinda Barrios to dig into this further and she did as noted above.  23 minutes spent in review, coordination, and documentation.   Reviewed by: De Blanch, PharmD, BCACP Clinical Pharmacist [facility] at [facility] [phone]

## 2020-03-11 NOTE — Addendum Note (Signed)
Addended by: Kelle Darting A on: 03/11/2020 03:08 PM   Modules accepted: Orders

## 2020-03-11 NOTE — Telephone Encounter (Signed)
Caller : Donna, [place]   Call Back # [phone]  Patient needs another home health agency b/c she need an aide to help her with bathing and a social work eval.   [facility] is providing wound care but, can not give her bath nor social work eval. b/c their agency has no one in those position, their are short of help.

## 2020-03-11 NOTE — Telephone Encounter (Signed)
Pt states she doesn't have a way to come in because her daughter just got out the hospital & her symptoms today are just dysuria. And still needs a refill on pain meds. Average pain level 8/10 .

## 2020-03-11 NOTE — Telephone Encounter (Signed)
Notify pt that I sent her prescription to [facility].

## 2020-03-12 ENCOUNTER — Telehealth (INDEPENDENT_AMBULATORY_CARE_PROVIDER_SITE_OTHER): Payer: HMO | Admitting: Medical

## 2020-03-12 DIAGNOSIS — I739 Peripheral vascular disease, unspecified: Secondary | ICD-10-CM

## 2020-03-12 DIAGNOSIS — I5022 Chronic systolic (congestive) heart failure: Secondary | ICD-10-CM

## 2020-03-12 DIAGNOSIS — M79671 Pain in right foot: Secondary | ICD-10-CM | POA: Diagnosis not present

## 2020-03-12 DIAGNOSIS — R262 Difficulty in walking, not elsewhere classified: Secondary | ICD-10-CM

## 2020-03-12 DIAGNOSIS — Z794 Long term (current) use of insulin: Secondary | ICD-10-CM

## 2020-03-12 DIAGNOSIS — E11621 Type 2 diabetes mellitus with foot ulcer: Secondary | ICD-10-CM | POA: Diagnosis not present

## 2020-03-12 DIAGNOSIS — L97509 Non-pressure chronic ulcer of other part of unspecified foot with unspecified severity: Secondary | ICD-10-CM

## 2020-03-12 DIAGNOSIS — K7469 Other cirrhosis of liver: Secondary | ICD-10-CM | POA: Diagnosis not present

## 2020-03-12 DIAGNOSIS — R300 Dysuria: Secondary | ICD-10-CM

## 2020-03-12 MED ORDER — NITROFURANTOIN MONOHYD MACRO 100 MG PO CAPS
100.0000 mg | ORAL_CAPSULE | Freq: Two times a day (BID) | ORAL | 0 refills | Status: DC
Start: 2020-03-12 — End: 2020-03-27

## 2020-03-12 MED ORDER — OXYCODONE HCL 10 MG PO TABS: ORAL_TABLET | ORAL | 0 refills | Status: DC

## 2020-03-12 NOTE — Telephone Encounter (Signed)
VV today

## 2020-03-12 NOTE — Progress Notes (Signed)
   Subjective:    Patient ID: Alexis [name], female    DOB: [birth year], 70 y.o.   MRN: [id number]  HPI  Virtual Visit via Video Note  I connected with Alexis [name] on 03/12/20 at  72:00 PM EST by a video enabled telemedicine application and verified that I am speaking with the correct person using two identifiers.  Location: Patient: home Provider: office  Participants- pt and myself.   I discussed the limitations of evaluation and management by telemedicine and the availability of in person appointments. The patient expressed understanding and agreed to proceed.  History of Present Illness:   Pt had recent burning on urination for 5 days. No fever, no chills or sweats. Yesterday she dropped off urine. She had both leukocytes and blood in urine.  Pt had recent lower ext vascular surgery in December. Post surgery she was taking pain med every 6 hours. Pt was given oxy 10 mg #90 by her surgeon before dc. She had reported large wound on her rt heal. Wound care is coming to her house. In past I had her just on oxycodone 5 mg once a day. She was on contract by myself.  Pt is now mostly bed bound. Hard to do self care. Pt states insurance company will pay for 2 hours a day.    Observations/Objective:   General-no acute distress, pleasant, oriented. Lungs- on inspection lungs appear unlabored. Neck- no tracheal deviation or jvd on inspection. Neuro- gross motor function appears intact.  Assessment and Plan: Recent increase of chronic pain. Now mostly in rt heel. Exceeding piror use of oxycodone since surgery. Her surgeon increased dose and number of times a day. Pt was on contract and given uds. I am giving 45 tabs of oxycodone with instruction must come in office 2 weeks to update contract. But also try to taper down on oxycodone to bid next week.  For uti I prescribed macrobid. Repeat UA in 2 weeks to check for blood.  For bed bound status placed referral to home health,  PT and OT.  For diabetes, atrial fibrillation and chf continue current regimen prescribed by specialist.  Follow up 2 week or as needed  Follow Up Instructions:    I discussed the assessment and treatment plan with the patient. The patient was provided an opportunity to ask questions and all were answered. The patient agreed with the plan and demonstrated an understanding of the instructions.   The patient was advised to call back or seek an in-person evaluation if the symptoms worsen or if the condition fails to improve as anticipated.  Time spent with patient today was 72 minutes which consisted of chart review, discussing diagnosis, work up, treatment and documentation. Lengthy discussion on escalation of pain meds, her history and expectation going forware in light of pain escalation.   Mackie Pai, PA-C   Review of Systems     Objective:   Physical Exam        Assessment & Plan:

## 2020-03-12 NOTE — Telephone Encounter (Signed)
On virtual visit will discuss needs and can place new referral for home health. Please get her scheduled today or tomorrow morning.

## 2020-03-12 NOTE — Telephone Encounter (Signed)
She dropped off urine yesterday. Could have had appointment. For pain meds want her to have virtual later today or tomorrow is am.

## 2020-03-12 NOTE — Telephone Encounter (Signed)
Pt.notified

## 2020-03-13 LAB — URINE CULTURE
MICRO NUMBER:: 11404749
SPECIMEN QUALITY:: ADEQUATE

## 2020-03-13 NOTE — Patient Instructions (Signed)
Recent increase of chronic pain. Now mostly in rt heel. Exceeding piror use of oxycodone since surgery. Her surgeon increased dose and number of times a day. Pt was on contract and given uds. I am giving 45 tabs of oxycodone with instruction must come in office 2 weeks to update contract. But also try to taper down on oxycodone to bid next week.  For uti I prescribed macrobid. Repeat UA in 2 weeks to check for blood.  For bed bound status placed referral to home health, PT and OT.  For diabetes, atrial fibrillation and chf continue current regimen prescribed by specialist.  Follow up 2 week or as needed

## 2020-03-14 ENCOUNTER — Ambulatory Visit: Payer: HMO | Admitting: Cardiology

## 2020-03-14 ENCOUNTER — Telehealth: Payer: Self-pay | Admitting: Medical

## 2020-03-14 NOTE — Telephone Encounter (Signed)
Patient states she needs in home care and her insurance will pay for 20 hrs  Please advice

## 2020-03-14 NOTE — Telephone Encounter (Signed)
Alexis [name], Patient's case work states that patient insurance will pay for home health aide [facility]

## 2020-03-19 NOTE — Telephone Encounter (Signed)
Sent community message to Alexis [name] at [facility]. Changed referral to pending awaiting reply.

## 2020-03-22 DIAGNOSIS — E1165 Type 2 diabetes mellitus with hyperglycemia: Secondary | ICD-10-CM | POA: Diagnosis not present

## 2020-03-24 DIAGNOSIS — N179 Acute kidney failure, unspecified: Secondary | ICD-10-CM | POA: Diagnosis not present

## 2020-03-24 DIAGNOSIS — Z87891 Personal history of nicotine dependence: Secondary | ICD-10-CM | POA: Diagnosis not present

## 2020-03-24 DIAGNOSIS — I5022 Chronic systolic (congestive) heart failure: Secondary | ICD-10-CM | POA: Diagnosis not present

## 2020-03-24 DIAGNOSIS — Z794 Long term (current) use of insulin: Secondary | ICD-10-CM | POA: Diagnosis not present

## 2020-03-24 DIAGNOSIS — Z4901 Encounter for fitting and adjustment of extracorporeal dialysis catheter: Secondary | ICD-10-CM | POA: Diagnosis not present

## 2020-03-24 DIAGNOSIS — I130 Hypertensive heart and chronic kidney disease with heart failure and stage 1 through stage 4 chronic kidney disease, or unspecified chronic kidney disease: Secondary | ICD-10-CM | POA: Diagnosis not present

## 2020-03-24 DIAGNOSIS — E1165 Type 2 diabetes mellitus with hyperglycemia: Secondary | ICD-10-CM | POA: Diagnosis not present

## 2020-03-24 DIAGNOSIS — E1122 Type 2 diabetes mellitus with diabetic chronic kidney disease: Secondary | ICD-10-CM | POA: Diagnosis not present

## 2020-03-25 ENCOUNTER — Telehealth: Payer: Self-pay | Admitting: Medical

## 2020-03-25 NOTE — Telephone Encounter (Signed)
Caller: Hardie Lora Case Manager  Caller: [phone]  Patient was denied for a [facility] health care worker [facility] [place] [facility] would like a referral sent to these two facility for care, patient needs help with bathing   [place]  Was denied

## 2020-03-25 NOTE — Telephone Encounter (Signed)
Per the Referral note from 03/24/20 Patient has been taken by [facility]. I spoke to Chrissie Noa today and she stated she was going to give her a call and let her know that they are back logged but she will be getting her scheduled soon.

## 2020-03-26 ENCOUNTER — Telehealth: Payer: Self-pay | Admitting: Medical

## 2020-03-26 ENCOUNTER — Other Ambulatory Visit: Payer: Self-pay

## 2020-03-26 DIAGNOSIS — R300 Dysuria: Secondary | ICD-10-CM

## 2020-03-26 DIAGNOSIS — I5022 Chronic systolic (congestive) heart failure: Secondary | ICD-10-CM

## 2020-03-26 DIAGNOSIS — I739 Peripheral vascular disease, unspecified: Secondary | ICD-10-CM

## 2020-03-26 DIAGNOSIS — N390 Urinary tract infection, site not specified: Secondary | ICD-10-CM | POA: Diagnosis not present

## 2020-03-26 DIAGNOSIS — R262 Difficulty in walking, not elsewhere classified: Secondary | ICD-10-CM

## 2020-03-26 NOTE — Telephone Encounter (Signed)
Orders placed.

## 2020-03-26 NOTE — Telephone Encounter (Signed)
Patient states [facility] home health  that was suggest for her to use is not able to assign her a care giver she is intitle  to get 20 hrs per week care   Please advice

## 2020-03-26 NOTE — Addendum Note (Signed)
Addended by: Jeronimo Greaves on: 03/26/2020 09:23 AM   Modules accepted: Orders

## 2020-03-26 NOTE — Telephone Encounter (Signed)
Cancelled orders that were placed earlier.

## 2020-03-26 NOTE — Telephone Encounter (Signed)
Spoke with Danae Chen from [facility] and she stated Alexis [name] called their office for a urine pick up , and she stated she would drop the urine off to [facility] and get a urine culture and fax over the results .

## 2020-03-26 NOTE — Telephone Encounter (Signed)
Patient states she has a UTI and some one will be dropping a urine sample for her in the office

## 2020-03-27 ENCOUNTER — Telehealth: Payer: Self-pay | Admitting: Medical

## 2020-03-27 ENCOUNTER — Telehealth: Payer: Self-pay

## 2020-03-27 ENCOUNTER — Encounter: Payer: Self-pay | Admitting: Medical

## 2020-03-27 MED ORDER — CEPHALEXIN 500 MG PO CAPS: 500.0000 mg | ORAL_CAPSULE | Freq: Two times a day (BID) | ORAL | 0 refills | Status: DC

## 2020-03-27 NOTE — Addendum Note (Signed)
Addended by: Anabel Halon on: 03/27/2020 12:37 PM   Modules accepted: Orders

## 2020-03-27 NOTE — Telephone Encounter (Signed)
Referral placed for home health aide - patient requesting bathing services.

## 2020-03-27 NOTE — Telephone Encounter (Signed)
Other telephone note sent to [facility]

## 2020-03-27 NOTE — Telephone Encounter (Signed)
Rx keflex sent to pharmacy pending urine culture.

## 2020-03-27 NOTE — Telephone Encounter (Signed)
Patient is requesting that her antibiotic is sent to [facility], patient would like it sent asap. So they can deliver to home

## 2020-03-27 NOTE — Telephone Encounter (Signed)
I put in the referral already on 03/13/2020. Can you print that referral. Elta Guadeloupe out [employer](place appropiate company) and fax  It.  Thanks,

## 2020-03-27 NOTE — Telephone Encounter (Signed)
-----   Message from Angie Fava, LPN sent at 90/38/3338  9:55 AM EST ----- Angie Fava, LPN sent to Angie Fava, LPN Mrs Lonia Skinner, please see Dr Steve Rattler recommendations and contact our office to set up the procedure  -Should take Lasix 60 mg p.o. once a day  -Proceed [facility]  -Have to hold Plavix 5 days before  -Check CBC, CMP, PT/INR at the time of EGD   Patient recovering from surgery and wants this done in February

## 2020-03-27 NOTE — Telephone Encounter (Signed)
Alexis [name] wanted to go to [employer] [facility] care. I spoke to her a few times today trying to understand who she wanted. The info she gave earlier about calling Angie at [facility] was incorrect and so was the phone number. I  She was finally again to give me a correct number and I called them and they were called [facility]. They game their fax number and a In Home Aid Referral was sent over to [place] [phone] to do intake for patient. I hope this works for her.

## 2020-03-27 NOTE — Telephone Encounter (Signed)
Spoke to patient this morning to see if she was ready to proceed with EGD with EV[facility] at [facility]. Patient reports over the past few months having 3 bypass surgeries and being bed ridden. She will contact the office when she recovers to schedule appointments.

## 2020-03-27 NOTE — Telephone Encounter (Signed)
Ok. Thanks for the help. I signed new order placed by hannah. If you need to refer to that.

## 2020-03-27 NOTE — Telephone Encounter (Signed)
Patient would like her prescription be sent to   [place], [place] - [address] Phone:  [phone]  Fax:  [phone]       Patient also said she found her own home health care worker

## 2020-03-27 NOTE — Telephone Encounter (Signed)
Not sure what the issue is?

## 2020-03-31 ENCOUNTER — Encounter: Payer: Self-pay | Admitting: Medical

## 2020-04-01 DIAGNOSIS — R262 Difficulty in walking, not elsewhere classified: Secondary | ICD-10-CM | POA: Diagnosis not present

## 2020-04-01 DIAGNOSIS — I5032 Chronic diastolic (congestive) heart failure: Secondary | ICD-10-CM | POA: Diagnosis not present

## 2020-04-01 DIAGNOSIS — E1140 Type 2 diabetes mellitus with diabetic neuropathy, unspecified: Secondary | ICD-10-CM | POA: Diagnosis not present

## 2020-04-01 DIAGNOSIS — Z794 Long term (current) use of insulin: Secondary | ICD-10-CM | POA: Diagnosis not present

## 2020-04-01 DIAGNOSIS — Z4801 Encounter for change or removal of surgical wound dressing: Secondary | ICD-10-CM | POA: Diagnosis not present

## 2020-04-01 DIAGNOSIS — M62262 Nontraumatic ischemic infarction of muscle, left lower leg: Secondary | ICD-10-CM | POA: Diagnosis not present

## 2020-04-01 DIAGNOSIS — E1122 Type 2 diabetes mellitus with diabetic chronic kidney disease: Secondary | ICD-10-CM | POA: Diagnosis not present

## 2020-04-01 DIAGNOSIS — I132 Hypertensive heart and chronic kidney disease with heart failure and with stage 5 chronic kidney disease, or end stage renal disease: Secondary | ICD-10-CM | POA: Diagnosis not present

## 2020-04-01 DIAGNOSIS — T8189XD Other complications of procedures, not elsewhere classified, subsequent encounter: Secondary | ICD-10-CM | POA: Diagnosis not present

## 2020-04-01 DIAGNOSIS — N186 End stage renal disease: Secondary | ICD-10-CM | POA: Diagnosis not present

## 2020-04-04 ENCOUNTER — Telehealth: Payer: Self-pay | Admitting: Medical

## 2020-04-04 NOTE — Telephone Encounter (Signed)
Caller: Dominica Severin PT assistant ([facility]) Call back: [phone]  FYI  Reporting patient weight 178lb

## 2020-04-04 NOTE — Telephone Encounter (Signed)
Would you get patient scheduled for preferably in office appointment next week.  If not in office then virtual but prefer in office.  If virtual need blood pressure, pulse weight and O2 sat.  Patient is very complicated and PT just sending me weight not adequate.  Though I do appreciate their effort.

## 2020-04-07 ENCOUNTER — Telehealth: Payer: Self-pay | Admitting: Medical

## 2020-04-07 NOTE — Telephone Encounter (Signed)
Caller / Alexis [name] /PT  Call Back @ [phone]  Patient fell yesterday about 4 am, EMS came to patient home to pick her and put her back in the bed. Patient states no injury other than normal  pain in right foot. Patient did not feel the need to got ED yesterday.

## 2020-04-07 NOTE — Telephone Encounter (Signed)
Pt needs virtual visit or in office. Preferably in office. 40 minute appointment. Needs bp, pulse 02 sat and weight.

## 2020-04-08 NOTE — Telephone Encounter (Signed)
Appt made for 2/11

## 2020-04-08 NOTE — Telephone Encounter (Signed)
Pt has appt 2/11

## 2020-04-11 ENCOUNTER — Ambulatory Visit (HOSPITAL_BASED_OUTPATIENT_CLINIC_OR_DEPARTMENT_OTHER)
Admission: RE | Admit: 2020-04-11 | Discharge: 2020-04-11 | Disposition: A | Discharge status: Home / Self Care | Payer: HMO | Source: Ambulatory Visit | Attending: Medical | Admitting: Medical

## 2020-04-11 ENCOUNTER — Telehealth: Payer: Self-pay | Admitting: Pharmacist

## 2020-04-11 ENCOUNTER — Emergency Department (HOSPITAL_BASED_OUTPATIENT_CLINIC_OR_DEPARTMENT_OTHER): Admission: EM | Admit: 2020-04-11 | Discharge: 2020-04-11 | Discharge status: Absent without leave | Payer: HMO

## 2020-04-11 ENCOUNTER — Ambulatory Visit (HOSPITAL_BASED_OUTPATIENT_CLINIC_OR_DEPARTMENT_OTHER): Admission: RE | Admit: 2020-04-11 | Payer: HMO | Source: Ambulatory Visit

## 2020-04-11 ENCOUNTER — Other Ambulatory Visit: Payer: Self-pay

## 2020-04-11 ENCOUNTER — Encounter (HOSPITAL_BASED_OUTPATIENT_CLINIC_OR_DEPARTMENT_OTHER): Payer: Self-pay

## 2020-04-11 ENCOUNTER — Ambulatory Visit (INDEPENDENT_AMBULATORY_CARE_PROVIDER_SITE_OTHER): Payer: HMO | Admitting: Medical

## 2020-04-11 VITALS — BP 115/53 | HR 74 | Temp 98.2°F | Resp 20 | Ht 62.0 in

## 2020-04-11 DIAGNOSIS — Z794 Long term (current) use of insulin: Secondary | ICD-10-CM | POA: Diagnosis not present

## 2020-04-11 DIAGNOSIS — T148XXD Other injury of unspecified body region, subsequent encounter: Secondary | ICD-10-CM | POA: Diagnosis not present

## 2020-04-11 DIAGNOSIS — E11621 Type 2 diabetes mellitus with foot ulcer: Secondary | ICD-10-CM

## 2020-04-11 DIAGNOSIS — I10 Essential (primary) hypertension: Secondary | ICD-10-CM

## 2020-04-11 DIAGNOSIS — K7469 Other cirrhosis of liver: Secondary | ICD-10-CM

## 2020-04-11 DIAGNOSIS — I5022 Chronic systolic (congestive) heart failure: Secondary | ICD-10-CM | POA: Insufficient documentation

## 2020-04-11 DIAGNOSIS — G8929 Other chronic pain: Secondary | ICD-10-CM | POA: Insufficient documentation

## 2020-04-11 DIAGNOSIS — Z79899 Other long term (current) drug therapy: Secondary | ICD-10-CM | POA: Diagnosis not present

## 2020-04-11 DIAGNOSIS — M79671 Pain in right foot: Secondary | ICD-10-CM | POA: Diagnosis not present

## 2020-04-11 DIAGNOSIS — L97509 Non-pressure chronic ulcer of other part of unspecified foot with unspecified severity: Secondary | ICD-10-CM | POA: Diagnosis not present

## 2020-04-11 DIAGNOSIS — S91301A Unspecified open wound, right foot, initial encounter: Secondary | ICD-10-CM | POA: Diagnosis not present

## 2020-04-11 DIAGNOSIS — I739 Peripheral vascular disease, unspecified: Secondary | ICD-10-CM

## 2020-04-11 MED ORDER — DOXYCYCLINE HYCLATE 100 MG PO TABS: 100.0000 mg | ORAL_TABLET | Freq: Two times a day (BID) | ORAL | 0 refills | Status: DC

## 2020-04-11 MED ORDER — NYSTATIN 100000 UNIT/GM EX CREA: 1.0000 "application " | TOPICAL_CREAM | Freq: Two times a day (BID) | CUTANEOUS | 1 refills | Status: DC

## 2020-04-11 MED ORDER — OXYCODONE HCL 10 MG PO TABS: ORAL_TABLET | ORAL | 0 refills | Status: DC

## 2020-04-11 NOTE — Patient Instructions (Addendum)
Htn/ bp well controlled. Continue losartan 25 mg daily and metoprolol xl 25 mg daily.   Chf clinically stable by exam and chest xray. Continue current diuretic.  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 Concern for infection and poor blood flow impeding healing. Will rx doxycyline pending wound care referral on February 16,2022.   For chronic pain rt great toe and now heal I prescribed oxycodone 10 mg 1 tab po every 8 hours prn severe pain. UDS given today. Will review if contract up to date on Monday. Can't find contract in epic. MA left after 5 pm. Will recheck on Monday. If not up to date then will get her update next week. Presently only gave 5 day rx.  Follow up ate to be determined after lab review.

## 2020-04-11 NOTE — Progress Notes (Addendum)
Chronic Care Management Pharmacy Assistant   Name: Alexis [name]  MRN: [id number] DOB: 08/09/1950  Reason for Encounter: Medication Review  PCP : Mackie Pai, PA-C  Allergies:   Allergies  Allergen Reactions   Indomethacin Other (See Comments)    Renal Insufficiency   Pregabalin Other (See Comments)    DIZZINESS    Sulfa Antibiotics Hives   Clindamycin/Lincomycin    Morphine And Related Nausea And Vomiting    Medications: Outpatient Encounter Medications as of 04/11/2020  Medication Sig Note   acetaminophen (TYLENOL) 500 MG tablet Take by mouth.    albuterol (VENTOLIN HFA) 108 (90 Base) MCG/ACT inhaler Inhale 2 puffs into the lungs every 6 (six) hours as needed for wheezing or shortness of breath.    allopurinol (ZYLOPRIM) 100 MG tablet Take 1 tablet (100 mg total) by mouth 2 (two) times daily.    AMBULATORY NON FORMULARY MEDICATION Motorized scooter.  Diagnosis: Osteoarthritis M19.90 06/05/2019: Reports her scooter and has some mechanical issues and a technician is scheduled to come and look at it this week.   atorvastatin (LIPITOR) 10 MG tablet TAKE ONE TABLET BY MOUTH EVERY MORNING    budesonide-formoterol (SYMBICORT) 160-4.5 MCG/ACT inhaler Inhale 2 puffs into the lungs 2 (two) times daily.    cephALEXin (KEFLEX) 500 MG capsule Take 1 capsule (500 mg total) by mouth 2 (two) times daily.    clopidogrel (PLAVIX) 75 MG tablet TAKE ONE TABLET BY MOUTH EVERY EVENING    Continuous Blood Gluc Receiver (FREESTYLE LIBRE 14 DAY READER) DEVI Use as directed to monitor BG.  Dx E11.65    Continuous Blood Gluc Sensor (FREESTYLE LIBRE 14 DAY SENSOR) MISC INJECT INTO THE SKIN EVERY 14 DAYS    ELIQUIS 5 MG TABS tablet Take 5 mg by mouth 2 (two) times daily.    fluticasone (FLONASE) 50 MCG/ACT nasal spray Place 2 sprays into both nostrils at bedtime. (Patient taking differently: Place 2 sprays into both nostrils in the morning and at bedtime. )    furosemide (LASIX) 20 MG tablet Take  two tablets by mouth in the morning and one tablet by mouth at noon.    gabapentin (NEURONTIN) 300 MG capsule Take 300 mg by mouth 3 (three) times daily. (Patient not taking: Reported on 01/01/2020) 01/01/2020: Developed tremors   Insulin Aspart FlexPen 100 UNIT/ML SOPN Inject 0-10 Units into the skin daily as needed (If glucose if over 200). Sliding scale    insulin degludec (TRESIBA) 100 UNIT/ML FlexTouch Pen Inject 14 Units into the skin daily.  11/06/2019: Client state she takes 20 units daily   Insulin Pen Needle (PEN NEEDLES 31GX5/16") 31G X 8 MM MISC Use as needed 4 times/day.  Dx E11.65    levocetirizine (XYZAL) 5 MG tablet Take 1 tablet (5 mg total) by mouth every evening.    levothyroxine (SYNTHROID) 150 MCG tablet Take 150 mcg by mouth daily before breakfast.     lidocaine (XYLOCAINE) 4 % external solution Apply topically.    losartan (COZAAR) 25 MG tablet Take 1 tablet (25 mg total) by mouth daily.    Melatonin 10 MG TABS Take 20 mg by mouth at bedtime.     methocarbamol (ROBAXIN) 500 MG tablet 1 tab po bid    metoprolol succinate (TOPROL-XL) 25 MG 24 hr tablet TAKE ONE (1) TABLET BY MOUTH EVERY DAY    ondansetron (ZOFRAN-ODT) 4 MG disintegrating tablet Take 4 mg by mouth every 8 (eight) hours as needed for nausea or vomiting.  ONETOUCH VERIO test strip 3 (three) times daily.    Oxycodone HCl 10 MG TABS 1 tab po tid prn severe pain    Prenatal Vit-Fe Fumarate-FA (MULTIVITAMIN-PRENATAL) 27-0.8 MG TABS tablet Take 1 tablet by mouth daily.     Probiotic CAPS Take 2 capsules by mouth daily. 50 billion (gummy)    RABEprazole (ACIPHEX) 20 MG tablet Take 1 tablet (20 mg total) by mouth daily.    sertraline (ZOLOFT) 50 MG tablet Take 1 tablet (50 mg total) by mouth daily. (Patient not taking: No sig reported) 03/06/2020: Patient states made her sick. Stopped taking on 03/04/20   spironolactone (ALDACTONE) 100 MG tablet Take 1 tablet (100 mg total) by mouth daily.    tobramycin (TOBREX) 0.3 %  ophthalmic solution Place 2 drops into both eyes every 6 (six) hours.    triamcinolone ointment (KENALOG) 0.1 % Apply to    No facility-administered encounter medications on file as of 04/11/2020.    Current Diagnosis: Patient Active Problem List   Diagnosis Date Noted   Depression 05/26/2019   Cellulitis 05/26/2019   Other cirrhosis of liver ([place]) 16/11/9602   Chronic systolic CHF (congestive heart failure) ([place]) 05/26/2019   Cellulitis in diabetic foot ([place]) 05/25/2019   Epigastric pain    Idiopathic esophageal varices without bleeding ([place])    Anemia due to chronic blood loss    Paroxysmal atrial fibrillation ([place]) 09/26/2018   Macrocytic anemia 09/04/2018   Pacemaker 08/25/2018   Insulin dependent diabetes mellitus with complications 54/10/8117   Constipation 08/25/2018   Fluid overload 08/25/2018   Chronic anticoagulation 08/25/2018   Morbid obesity ([place]) 08/25/2018   Chronic diastolic heart failure ([place]) 05/17/2018   Acute bronchitis with COPD ([place]) 01/16/2018   Acquired absence of other right toe(s) ([place]) 11/16/2017   Type 2 diabetes mellitus with foot ulcer ([place]) 11/16/2017   Crush injury of left foot, initial encounter 11/28/2016   Osteomyelitis ([place]) 07/30/2016   Gangrene of foot ([place]) 04/27/2016   Thrombocytopenia ([place])    Vomiting and diarrhea 04/05/2016   Hyperlipidemia 11/18/2015   Gout 08/05/2014   Cough 08/05/2014   Allergic rhinitis 07/12/2014   Osteoarthritis 07/12/2014   Asthma 07/12/2014   Congestive heart failure ([place]) 07/12/2014   Permanent atrial fibrillation ([place]) 07/12/2014   HTN (hypertension) 07/12/2014   Hypothyroidism 07/12/2014   History of substance abuse ([place]) 07/12/2014   Peripheral vascular disease ([place]) 04/02/2013   Discoloration of skin 04/02/2013    Goals Addressed   None    Reviewed chart for medication changes ahead of medication coordination call.  No Consults since last care coordination call.  Office Visits: 04/11/20  Mackie Pai, [place]. Labs drawn. STARTED Doxycycline Hyclate 100 mg 2 times daily and Nystatin 100000 Unit/GM   Hospital: 04/11/20-Admitted 04/12/20 Efraim Kaufmann , MD. Per he doctor her potassium was extremely high and she needed to go to the hospital. 24 Observation Area.    BP Readings from Last 3 Encounters:  02/18/20 136/64  12/24/19 128/60  10/05/19 124/72    Lab Results  Component Value Date   HGBA1C 7.7 (H) 05/26/2019     Patient obtains medications through Adherence Packaging  90 Days   Last adherence delivery included: Losartan 25 mg; one tab with Breakfast Levocetirizine 5 mg tab' one tab at Dinner  Allopurinol 100 mg; one tab with Breakfast and one tab with Dinner Furosemide 20 mg: two tabs with Breakfast and two with Lunch  Atorvastatin 10 mg; one tab with Breakfast  Metoprolol Succinate  25 mg; one tab with Breakfast  Rabeprazole 20 mg; one tab with Breakfast  Levothyroxine 150 mcg; one tab before Breakfast Spironolactone 100 mg; one tab with Breakfast Clopidogrel 75 mg; one tab with Dinner Methocarbamol 500 mg; one tab with Breakfast, one with Dinner Oxycodone 5 mg Tresiba Pen Needles 31 g  Patient declined meds last month: One Touch Delica Lancets One Touch Test Strips (enough on hand)  Patient is not due for a adherence delivery because patient is in the hospital at this time, informed the patient to call when she is discharged with her updated medication list.  Called patient and reviewed medications and coordinated delivery.  This delivery to include: None.   Follow-Up:  Coordination of Enhanced Pharmacy Services and Pharmacist Review   Charlann Lange, [place] Pharmacist Assistant [phone]  5 minutes spent in review, coordination, and documentation.  Reviewed by: Beverly Milch, PharmD Clinical Pharmacist [facility] [phone]

## 2020-04-11 NOTE — Progress Notes (Addendum)
Subjective:    Patient ID: Alexis [name], female    DOB: December 19, 1950, 70 y.o.   MRN: [id number]  HPI  Pt in today.  Pt has appointment with Dr. Eliberto Ivory Kidney later this month April 23, 2020.  She states has sever rt heel pain since November pain has increased. Pain on light touch. Pt has appointment April 17, 2019. Rt great toe tip chronic wound and now heel wound since November. She states present post surgery. Hx of pvd. Hx vacular procedure and debridement procedures per pt.  Hx of chf. No shortness of breath recently.   Hx of cirrhosis and thrombocytopenia.  History of htn. Bp well controlled today.     Review of Systems  Constitutional: Positive for fatigue. Negative for chills and fever.  HENT: Negative for congestion, drooling and ear pain.   Respiratory: Negative for cough, chest tightness, shortness of breath and wheezing.   Cardiovascular: Negative for chest pain and palpitations.  Gastrointestinal: Negative for abdominal pain and anal bleeding.  Genitourinary: Negative for dysuria and pelvic pain.  Musculoskeletal: Negative for back pain.  Neurological: Negative for speech difficulty, weakness, numbness and headaches.  Hematological: Negative for adenopathy. Does not bruise/bleed easily.  Psychiatric/Behavioral: Negative for behavioral problems, confusion and dysphoric mood.    Past Medical History:  Diagnosis Date  . Allergy   . Anxiety   . Arthritis   . Asthma   . Atrial fibrillation ([place])   . CHF (congestive heart failure) ([place])   . Cirrhosis ([place])   . COPD (chronic obstructive pulmonary disease) ([place])   . Depression 05/26/2019  . Diabetes mellitus without complication ([place])   . Hyperlipidemia   . Hypertension   . Macrocytic anemia 09/04/2018  . Myocardial infarction ([place])    several  . Pacemaker    CRT-P with RV lead and His Bundle lead ( high threshold)   . Peripheral neuropathy   . Pneumonia   . PONV (postoperative nausea and  vomiting)    unsure of exactly what happened at [facility] in 2010 (knee surgery) that led to crital care  . Thyroid disease      Social History   Socioeconomic History  . Marital status: Married    Spouse name: Juanda Crumble  . Number of children: 1  . Years of education: Not on file  . Highest education level: Not on file  Occupational History  . 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 Smoking status: Former Smoker    Types: Cigarettes    Quit date: 03/01/1998    Years since quitting: 22.1  . Smokeless tobacco: Never Used  Vaping Use  . Vaping Use: Never used  Substance and Sexual Activity  . 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  . Drug use: Not Currently    Types: Marijuana    Comment: remote use  . Sexual activity: Never  Other Topics Concern  . 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 Difficulty of Paying Living Expenses: Not very  hard  Food Insecurity: No Food Insecurity  . Worried About Charity fundraiser in the Last Year: Never true  . Ran Out of Food in the Last Year: Never true  Transportation Needs: No Transportation Needs  . Lack of Transportation (Medical): No  . Lack of Transportation (Non-Medical): No  Physical Activity: Not on file  Stress: Not on file  Social Connections: Not on file  Intimate Partner Violence: Not on file    Past Surgical History:  Procedure Laterality Date  . ABDOMINAL AORTOGRAM W/LOWER EXTREMITY N/A 04/29/2016   Procedure: Abdominal Aortogram w/Lower Extremity;  Surgeon: Waynetta Sandy, MD;   Location: [facility];  Service: Cardiovascular;  Laterality: N/A;  . ABDOMINAL AORTOGRAM W/LOWER EXTREMITY N/A 05/06/2017   Procedure: ABDOMINAL AORTOGRAM W/LOWER EXTREMITY;  Surgeon: Angelia Mould, MD;  Location: [facility];  Service: Cardiovascular;  Laterality: N/A;  bilateral  . ABDOMINAL AORTOGRAM W/LOWER EXTREMITY Bilateral 05/04/2019   Procedure: ABDOMINAL AORTOGRAM W/LOWER EXTREMITY;  Surgeon: Angelia Mould, MD;  Location: [facility];  Service: Cardiovascular;  Laterality: Bilateral;  . ABDOMINAL AORTOGRAM W/LOWER EXTREMITY Left 08/27/2019   Procedure: ABDOMINAL AORTOGRAM W/LOWER EXTREMITY;  Surgeon: Waynetta Sandy, MD;  Location: [facility];  Service: Cardiovascular;  Laterality: Left;  . ABDOMINAL HYSTERECTOMY    . AMPUTATION Right 07/30/2016   Procedure: AMPUTATION RIGHT SECOND TOE;  Surgeon: Angelia Mould, MD;  Location: [facility];  Service: Vascular;  Laterality: Right;  . CARDIAC CATHETERIZATION     2005 at [place] N/A 12/25/2018   Procedure: COLONOSCOPY WITH PROPOFOL;  Surgeon: Jackquline Denmark, MD;  Location: [facility];  Service: Endoscopy;  Laterality: N/A;  . ESOPHAGEAL BANDING  12/25/2018   Procedure: ESOPHAGEAL BANDING;  Surgeon: Jackquline Denmark, MD;  Location: [facility];  Service: Endoscopy;;  . ESOPHAGEAL BANDING  05/01/2019   Procedure: ESOPHAGEAL BANDING;  Surgeon: Jackquline Denmark, MD;  Location: [facility];  Service: Endoscopy;;  . ESOPHAGOGASTRODUODENOSCOPY (EGD) WITH PROPOFOL N/A 12/25/2018   Procedure: ESOPHAGOGASTRODUODENOSCOPY (EGD) WITH PROPOFOL;  Surgeon: Jackquline Denmark, MD;  [facility];  Service: Endoscopy;  Laterality: N/A;  . ESOPHAGOGASTRODUODENOSCOPY (EGD) WITH PROPOFOL N/A 05/01/2019   Procedure: ESOPHAGOGASTRODUODENOSCOPY (EGD) WITH PROPOFOL;  Surgeon: Jackquline Denmark, MD;  [facility];  Service: Endoscopy;  Laterality: N/A;  . LOWER  EXTREMITY ANGIOGRAPHY Right 05/14/2019   Procedure: LOWER EXTREMITY ANGIOGRAPHY;  Surgeon: Waynetta Sandy, MD;  Location: [facility];  Service: Cardiovascular;  Laterality: Right;  . PERIPHERAL VASCULAR ATHERECTOMY Right 04/29/2016   Procedure: Peripheral Vascular Atherectomy-Right Popliteal;  Surgeon: Waynetta Sandy, MD;  Location: [facility];  Service: Cardiovascular;  Laterality: Right;  . PERIPHERAL VASCULAR ATHERECTOMY Right 05/14/2019   Procedure: PERIPHERAL VASCULAR ATHERECTOMY;  Surgeon: Waynetta Sandy, MD;  Location: [facility];  Service: Cardiovascular;  Laterality: Right;  right SFA/pop  . PERIPHERAL VASCULAR BALLOON ANGIOPLASTY Left 08/27/2019   Procedure: PERIPHERAL VASCULAR BALLOON ANGIOPLASTY;  Surgeon: Waynetta Sandy, MD;  Location: [facility];  Service: Cardiovascular;  Laterality: Left;  popliteal  . PERIPHERAL VASCULAR INTERVENTION Right 04/29/2016   Procedure: Peripheral Vascular Intervention-Right Popliteal;  Surgeon: Waynetta Sandy, MD;  Location: [facility];  Service: Cardiovascular;  Laterality: Right;  POPLITEAL PTA  . PERIPHERAL VASCULAR INTERVENTION Right 05/14/2019   Procedure: PERIPHERAL VASCULAR INTERVENTION;  Surgeon: Waynetta Sandy, MD;  Location: [facility];  Service: Cardiovascular;  Laterality: Right;  popliteal stent  . RADIOFREQUENCY ABLATION    . TOTAL KNEE ARTHROPLASTY    . TUBAL LIGATION      Family History  Problem Relation Age of Onset  . Diabetes Mother   . Hyperlipidemia Mother   . Diabetes Father   . Hyperlipidemia Father   . Heart disease Father        before age 34  . Heart attack Father   . Diabetes Brother   . Hyperlipidemia Brother   . Heart attack Brother     Allergies  Allergen Reactions  . Indomethacin Other (See Comments)    Renal Insufficiency  . Pregabalin Other (See Comments)    DIZZINESS   . Sulfa Antibiotics Hives  .  Clindamycin/Lincomycin   . Morphine And Related Nausea And Vomiting    Current Outpatient Medications on File Prior to Visit  Medication Sig Dispense Refill  . acetaminophen (TYLENOL) 500 MG tablet Take by mouth.    Marland Kitchen albuterol (VENTOLIN HFA) 108 (90 Base) MCG/ACT inhaler Inhale 2 puffs into the lungs every 6 (six) hours as needed for wheezing or shortness of breath.    . allopurinol (ZYLOPRIM) 100 MG tablet Take 1 tablet (100 mg total) by mouth 2 (two) times daily. 60 tablet 3  . AMBULATORY NON FORMULARY MEDICATION Motorized scooter.  Diagnosis: Osteoarthritis M19.90 1 each 0  . atorvastatin (LIPITOR) 10 MG tablet TAKE ONE TABLET BY MOUTH EVERY MORNING 90 tablet 3  . budesonide-formoterol (SYMBICORT) 160-4.5 MCG/ACT inhaler Inhale 2 puffs into the lungs 2 (two) times daily. 1 each 3  . cephALEXin (KEFLEX) 500 MG capsule Take 1 capsule (500 mg total) by mouth 2 (two) times daily. 14 capsule 0  . clopidogrel (PLAVIX) 75 MG tablet TAKE ONE TABLET BY MOUTH EVERY EVENING 90 tablet 3  . Continuous Blood Gluc Receiver (FREESTYLE LIBRE 14 DAY READER) DEVI Use as directed to monitor BG.  Dx E11.65    . Continuous Blood Gluc Sensor (FREESTYLE LIBRE 14 DAY SENSOR) MISC INJECT INTO THE SKIN EVERY 14 DAYS    . ELIQUIS 5 MG TABS tablet Take 5 mg by mouth 2 (two) times daily.    . fluticasone (FLONASE) 50 MCG/ACT nasal spray Place 2 sprays into both nostrils at bedtime. (Patient taking differently: Place 2 sprays into both nostrils in the morning and at bedtime. ) 16 g 3  . furosemide (LASIX) 20 MG tablet Take two tablets by mouth in the morning and one tablet by mouth at noon. 270 tablet 3  . gabapentin (NEURONTIN) 300 MG capsule Take 300 mg by mouth 3 (three) times daily. (Patient not taking: Reported on 01/01/2020)    . Insulin Aspart FlexPen 100 UNIT/ML SOPN Inject 0-10 Units into the skin daily as needed (If glucose if over 200). Sliding scale    . insulin degludec (TRESIBA) 100 UNIT/ML FlexTouch Pen  Inject 14 Units into the skin daily.     . Insulin Pen Needle (PEN NEEDLES 31GX5/16") 31G X 8 MM MISC Use as needed 4 times/day.  Dx E11.65    . levocetirizine (XYZAL) 5 MG tablet Take 1 tablet (5 mg total) by mouth every evening. 30 tablet 2  . levothyroxine (SYNTHROID) 150 MCG tablet Take 150 mcg by mouth daily before breakfast.     . lidocaine (XYLOCAINE) 4 % external solution Apply topically.    Marland Kitchen losartan (COZAAR) 25 MG tablet Take 1 tablet (25 mg total) by mouth daily. 90 tablet 3  . Melatonin 10 MG TABS  Take 20 mg by mouth at bedtime.     . methocarbamol (ROBAXIN) 500 MG tablet 1 tab po bid 30 tablet 0  . metoprolol succinate (TOPROL-XL) 25 MG 24 hr tablet TAKE ONE (1) TABLET BY MOUTH EVERY DAY 90 tablet 1  . ondansetron (ZOFRAN-ODT) 4 MG disintegrating tablet Take 4 mg by mouth every 8 (eight) hours as needed for nausea or vomiting.     Glory Rosebush VERIO test strip 3 (three) times daily.    . Oxycodone HCl 10 MG TABS 1 tab po tid prn severe pain 45 tablet 0  . Prenatal Vit-Fe Fumarate-FA (MULTIVITAMIN-PRENATAL) 27-0.8 MG TABS tablet Take 1 tablet by mouth daily.     . Probiotic CAPS Take 2 capsules by mouth daily. 50 billion (gummy)    . RABEprazole (ACIPHEX) 20 MG tablet Take 1 tablet (20 mg total) by mouth daily. 30 tablet 6  . sertraline (ZOLOFT) 50 MG tablet Take 1 tablet (50 mg total) by mouth daily. (Patient not taking: No sig reported) 30 tablet 3  . spironolactone (ALDACTONE) 100 MG tablet Take 1 tablet (100 mg total) by mouth daily. 30 tablet 6  . tobramycin (TOBREX) 0.3 % ophthalmic solution Place 2 drops into both eyes every 6 (six) hours. 5 mL 0  . triamcinolone ointment (KENALOG) 0.1 % Apply to     No current facility-administered medications on file prior to visit.    BP (!) 115/53   Pulse 74   Temp 98.2 F (36.8 C) (Oral)   Resp 20   Ht 5' 2"  (1.575 m)   SpO2 99%   BMI 40.24 kg/m       Objective:   Physical Exam   General Mental Status- Alert. General  Appearance- Not in acute distress.   Skin General: Color- Normal Color. Moisture- Normal Moisture.  Neck Carotid Arteries- Normal color. Moisture- Normal Moisture. No carotid bruits. No JVD.  Chest and Lung Exam Auscultation: Breath Sounds:-Normal.  Cardiovascular Auscultation:Rythm- Regular. Murmurs & Other Heart Sounds:Auscultation of the heart reveals- No Murmurs.  Abdomen Inspection:-Inspeection Normal. Palpation/Percussion:Note:No mass. Palpation and Percussion of the abdomen reveal- Non Tender, Non Distended + BS, no rebound or guarding.   Neurologic Cranial Nerve exam:- CN III-XII intact(No nystagmus), symmetric smile. Strength:- 5/5 equal and symmetric strength both upper and lower extremities.   Lower ext- no pedal edema. Rt foot- heal 6 cm x 6 cm large scab. No dc. Rt great toe. Distal tip has scab.       Assessment & Plan:  Htn/ bp well controlled. Continue losartan 25 mg daily and metoprolol xl 25 mg daily.   Chf clinically stable by exam and chest xray. Continue current diuretic.  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 Concern for infection and poor blood flow impeding healing. Will rx doxycyline pending wound care referral on February 16,2022.   For chronic pain rt great toe and now heal I prescribed oxycodone 10 mg 1 tab po every 8 hours prn severe pain. UDS given today. Will review if contract up to date on Monday. Can't find contract in epic. MA left after 5 pm. Will recheck on Monday. If not up to date then will get her update next week. Presently only gave 5 day rx.  Follow up ate to be determined after lab review.  Time spent with patient today was 40+  minutes which consisted of chart revdiew, discussing diagnoses, work up, treatment and documentation. Answered all pt questions.  Percell Miller  Nikoletta Varma, PA-C

## 2020-04-12 ENCOUNTER — Telehealth: Payer: Self-pay | Admitting: Medical

## 2020-04-12 DIAGNOSIS — E1142 Type 2 diabetes mellitus with diabetic polyneuropathy: Secondary | ICD-10-CM | POA: Diagnosis not present

## 2020-04-12 DIAGNOSIS — L97519 Non-pressure chronic ulcer of other part of right foot with unspecified severity: Secondary | ICD-10-CM | POA: Diagnosis not present

## 2020-04-12 DIAGNOSIS — Z7902 Long term (current) use of antithrombotics/antiplatelets: Secondary | ICD-10-CM | POA: Diagnosis not present

## 2020-04-12 DIAGNOSIS — N189 Chronic kidney disease, unspecified: Secondary | ICD-10-CM | POA: Diagnosis not present

## 2020-04-12 DIAGNOSIS — I4891 Unspecified atrial fibrillation: Secondary | ICD-10-CM | POA: Diagnosis not present

## 2020-04-12 DIAGNOSIS — S91301A Unspecified open wound, right foot, initial encounter: Secondary | ICD-10-CM | POA: Diagnosis not present

## 2020-04-12 DIAGNOSIS — M86171 Other acute osteomyelitis, right ankle and foot: Secondary | ICD-10-CM | POA: Diagnosis not present

## 2020-04-12 DIAGNOSIS — I5032 Chronic diastolic (congestive) heart failure: Secondary | ICD-10-CM | POA: Diagnosis not present

## 2020-04-12 DIAGNOSIS — E039 Hypothyroidism, unspecified: Secondary | ICD-10-CM | POA: Diagnosis not present

## 2020-04-12 DIAGNOSIS — I959 Hypotension, unspecified: Secondary | ICD-10-CM | POA: Diagnosis not present

## 2020-04-12 DIAGNOSIS — N185 Chronic kidney disease, stage 5: Secondary | ICD-10-CM | POA: Diagnosis not present

## 2020-04-12 DIAGNOSIS — E11319 Type 2 diabetes mellitus with unspecified diabetic retinopathy without macular edema: Secondary | ICD-10-CM | POA: Diagnosis not present

## 2020-04-12 DIAGNOSIS — K7581 Nonalcoholic steatohepatitis (NASH): Secondary | ICD-10-CM | POA: Diagnosis not present

## 2020-04-12 DIAGNOSIS — J449 Chronic obstructive pulmonary disease, unspecified: Secondary | ICD-10-CM | POA: Diagnosis not present

## 2020-04-12 DIAGNOSIS — I10 Essential (primary) hypertension: Secondary | ICD-10-CM | POA: Diagnosis not present

## 2020-04-12 DIAGNOSIS — I739 Peripheral vascular disease, unspecified: Secondary | ICD-10-CM | POA: Diagnosis not present

## 2020-04-12 DIAGNOSIS — E875 Hyperkalemia: Secondary | ICD-10-CM | POA: Diagnosis not present

## 2020-04-12 DIAGNOSIS — Z89421 Acquired absence of other right toe(s): Secondary | ICD-10-CM | POA: Diagnosis not present

## 2020-04-12 DIAGNOSIS — Z95820 Peripheral vascular angioplasty status with implants and grafts: Secondary | ICD-10-CM | POA: Diagnosis not present

## 2020-04-12 DIAGNOSIS — E871 Hypo-osmolality and hyponatremia: Secondary | ICD-10-CM | POA: Diagnosis not present

## 2020-04-12 DIAGNOSIS — N390 Urinary tract infection, site not specified: Secondary | ICD-10-CM | POA: Diagnosis not present

## 2020-04-12 DIAGNOSIS — Z881 Allergy status to other antibiotic agents status: Secondary | ICD-10-CM | POA: Diagnosis not present

## 2020-04-12 DIAGNOSIS — D638 Anemia in other chronic diseases classified elsewhere: Secondary | ICD-10-CM | POA: Diagnosis not present

## 2020-04-12 DIAGNOSIS — B965 Pseudomonas (aeruginosa) (mallei) (pseudomallei) as the cause of diseases classified elsewhere: Secondary | ICD-10-CM | POA: Diagnosis not present

## 2020-04-12 DIAGNOSIS — Z794 Long term (current) use of insulin: Secondary | ICD-10-CM | POA: Diagnosis not present

## 2020-04-12 DIAGNOSIS — L304 Erythema intertrigo: Secondary | ICD-10-CM | POA: Diagnosis not present

## 2020-04-12 DIAGNOSIS — L0889 Other specified local infections of the skin and subcutaneous tissue: Secondary | ICD-10-CM | POA: Diagnosis not present

## 2020-04-12 DIAGNOSIS — N179 Acute kidney failure, unspecified: Secondary | ICD-10-CM | POA: Diagnosis not present

## 2020-04-12 DIAGNOSIS — Z885 Allergy status to narcotic agent status: Secondary | ICD-10-CM | POA: Diagnosis not present

## 2020-04-12 DIAGNOSIS — N184 Chronic kidney disease, stage 4 (severe): Secondary | ICD-10-CM | POA: Diagnosis not present

## 2020-04-12 DIAGNOSIS — Z87891 Personal history of nicotine dependence: Secondary | ICD-10-CM | POA: Diagnosis not present

## 2020-04-12 DIAGNOSIS — E1151 Type 2 diabetes mellitus with diabetic peripheral angiopathy without gangrene: Secondary | ICD-10-CM | POA: Diagnosis not present

## 2020-04-12 DIAGNOSIS — E1169 Type 2 diabetes mellitus with other specified complication: Secondary | ICD-10-CM | POA: Diagnosis not present

## 2020-04-12 DIAGNOSIS — S91101A Unspecified open wound of right great toe without damage to nail, initial encounter: Secondary | ICD-10-CM | POA: Diagnosis not present

## 2020-04-12 DIAGNOSIS — K8020 Calculus of gallbladder without cholecystitis without obstruction: Secondary | ICD-10-CM | POA: Diagnosis not present

## 2020-04-12 DIAGNOSIS — K7460 Unspecified cirrhosis of liver: Secondary | ICD-10-CM | POA: Diagnosis not present

## 2020-04-12 DIAGNOSIS — Z7901 Long term (current) use of anticoagulants: Secondary | ICD-10-CM | POA: Diagnosis not present

## 2020-04-12 DIAGNOSIS — E11621 Type 2 diabetes mellitus with foot ulcer: Secondary | ICD-10-CM | POA: Diagnosis not present

## 2020-04-12 DIAGNOSIS — L97413 Non-pressure chronic ulcer of right heel and midfoot with necrosis of muscle: Secondary | ICD-10-CM | POA: Diagnosis not present

## 2020-04-12 DIAGNOSIS — I4821 Permanent atrial fibrillation: Secondary | ICD-10-CM | POA: Diagnosis not present

## 2020-04-12 DIAGNOSIS — M868X7 Other osteomyelitis, ankle and foot: Secondary | ICD-10-CM | POA: Diagnosis not present

## 2020-04-12 DIAGNOSIS — I130 Hypertensive heart and chronic kidney disease with heart failure and stage 1 through stage 4 chronic kidney disease, or unspecified chronic kidney disease: Secondary | ICD-10-CM | POA: Diagnosis not present

## 2020-04-12 LAB — CBC WITH DIFFERENTIAL/PLATELET
Absolute Monocytes: 730 cells/uL (ref 200–950)
Basophils Absolute: 33 cells/uL (ref 0–200)
Basophils Relative: 0.4 %
Eosinophils Absolute: 320 cells/uL (ref 15–500)
Eosinophils Relative: 3.9 %
HCT: 31.7 % — ABNORMAL LOW (ref 35.0–45.0)
Hemoglobin: 10.1 g/dL — ABNORMAL LOW (ref 11.7–15.5)
Lymphs Abs: 1542 cells/uL (ref 850–3900)
MCH: 27.5 pg (ref 27.0–33.0)
MCHC: 31.9 g/dL — ABNORMAL LOW (ref 32.0–36.0)
MCV: 86.4 fL (ref 80.0–100.0)
MPV: 11.9 fL (ref 7.5–12.5)
Monocytes Relative: 8.9 %
Neutro Abs: 5576 cells/uL (ref 1500–7800)
Neutrophils Relative %: 68 %
Platelets: 177 10*3/uL (ref 140–400)
RBC: 3.67 10*6/uL — ABNORMAL LOW (ref 3.80–5.10)
RDW: 16.7 % — ABNORMAL HIGH (ref 11.0–15.0)
Total Lymphocyte: 18.8 %
WBC: 8.2 10*3/uL (ref 3.8–10.8)

## 2020-04-12 LAB — BRAIN NATRIURETIC PEPTIDE: Brain Natriuretic Peptide: 64 pg/mL (ref <100)

## 2020-04-12 LAB — COMPREHENSIVE METABOLIC PANEL
AG Ratio: 0.7 (calc) — ABNORMAL LOW (ref 1.0–2.5)
ALT: 40 U/L — ABNORMAL HIGH (ref 6–29)
AST: 58 U/L — ABNORMAL HIGH (ref 10–35)
Albumin: 3.2 g/dL — ABNORMAL LOW (ref 3.6–5.1)
Alkaline phosphatase (APISO): 412 U/L — ABNORMAL HIGH (ref 37–153)
BUN/Creatinine Ratio: 54 (calc) — ABNORMAL HIGH (ref 6–22)
BUN: 124 mg/dL — ABNORMAL HIGH (ref 7–25)
CO2: 23 mmol/L (ref 20–32)
Calcium: 9.8 mg/dL (ref 8.6–10.4)
Chloride: 96 mmol/L — ABNORMAL LOW (ref 98–110)
Creat: 2.3 mg/dL — ABNORMAL HIGH (ref 0.60–0.93)
Globulin: 4.5 g/dL (calc) — ABNORMAL HIGH (ref 1.9–3.7)
Glucose, Bld: 155 mg/dL — ABNORMAL HIGH (ref 65–99)
Potassium: 6.7 mmol/L (ref 3.5–5.3)
Sodium: 129 mmol/L — ABNORMAL LOW (ref 135–146)
Total Bilirubin: 0.7 mg/dL (ref 0.2–1.2)
Total Protein: 7.7 g/dL (ref 6.1–8.1)

## 2020-04-12 NOTE — Telephone Encounter (Signed)
Called pt to advise on k level and go to ED. She was already in hospital. She was called about 6 am and advised by likely MD on call.

## 2020-04-14 ENCOUNTER — Encounter: Payer: Self-pay | Admitting: Medical

## 2020-04-14 ENCOUNTER — Other Ambulatory Visit: Payer: Self-pay | Admitting: *Deleted

## 2020-04-14 ENCOUNTER — Telehealth: Payer: Self-pay | Admitting: *Deleted

## 2020-04-14 LAB — DRUG MONITORING, PANEL 8 WITH CONFIRMATION, URINE
6 Acetylmorphine: NEGATIVE ng/mL (ref <10)
Alcohol Metabolites: NEGATIVE ng/mL
Alphahydroxyalprazolam: NEGATIVE ng/mL (ref <25)
Alphahydroxymidazolam: NEGATIVE ng/mL (ref <50)
Alphahydroxytriazolam: NEGATIVE ng/mL (ref <50)
Aminoclonazepam: NEGATIVE ng/mL (ref <25)
Amphetamines: NEGATIVE ng/mL (ref <500)
Benzodiazepines: NEGATIVE ng/mL (ref <100)
Buprenorphine, Urine: NEGATIVE ng/mL (ref <5)
Cocaine Metabolite: NEGATIVE ng/mL (ref <150)
Codeine: NEGATIVE ng/mL (ref <50)
Creatinine: 32.6 mg/dL
Hydrocodone: NEGATIVE ng/mL (ref <50)
Hydromorphone: NEGATIVE ng/mL (ref <50)
Hydroxyethylflurazepam: NEGATIVE ng/mL (ref <50)
Lorazepam: NEGATIVE ng/mL (ref <50)
MDMA: NEGATIVE ng/mL (ref <500)
Marijuana Metabolite: NEGATIVE ng/mL (ref <20)
Morphine: NEGATIVE ng/mL (ref <50)
Nordiazepam: NEGATIVE ng/mL (ref <50)
Norhydrocodone: NEGATIVE ng/mL (ref <50)
Noroxycodone: 370 ng/mL — ABNORMAL HIGH (ref <50)
Opiates: NEGATIVE ng/mL (ref <100)
Oxazepam: NEGATIVE ng/mL (ref <50)
Oxidant: NEGATIVE ug/mL
Oxycodone: 284 ng/mL — ABNORMAL HIGH (ref <50)
Oxycodone: POSITIVE ng/mL — AB (ref <100)
Oxymorphone: 175 ng/mL — ABNORMAL HIGH (ref <50)
Temazepam: NEGATIVE ng/mL (ref <50)
pH: 6.6 (ref 4.5–9.0)

## 2020-04-14 LAB — DM TEMPLATE

## 2020-04-14 NOTE — Telephone Encounter (Signed)
Contact Type Call Who Is Calling Lab Lab Name [facility] Lab Phone Number [phone] Lab Tech Name Diane Lab Reference Number [id number] R Chief Complaint Lab Result (Critical or Stat) Call Type Lab Send to RN Reason for Call Report lab results Initial Comment Caller is calling with urgent lab results. Translation No Nurse Assessment Nurse: Drue Flirt, RN, [place] Date/Time (Eastern Time): 04/12/2020 5:55:33 AM Is there an on-call provider listed? ---Yes Please list name of person reporting value (Lab Employee) and a contact number. ---Juventino Slovak [phone] Please document the following items: Lab name Lab value (read back to lab to verify) Reference range for lab value Date and time blood was drawn Collect time of birth for bilirubin results ---CMP 04/11/20 at 4:07pm potassium 6.7 Disp. Time Eilene Ghazi Time) Disposition Final User 04/12/2020 6:07:02 AM Lab Call Drue Flirt, RN, [place] Reason: see lab assess 04/12/2020 6:07:09 AM Clinical Call Yes Drue Flirt, RN, [place] Comments User: Zannie Cove, RN Date/Time Eilene Ghazi Time): 04/12/2020 6:06:41 AM i advised patient to go to the ER due to high potassium level. she stated she did not have anyone with ehr and i advised her to call 911

## 2020-04-14 NOTE — Telephone Encounter (Signed)
Alexis [name] spoke with patient and she did go to ER

## 2020-04-16 ENCOUNTER — Telehealth: Payer: Self-pay | Admitting: Medical

## 2020-04-16 NOTE — Telephone Encounter (Signed)
Patient's family came to get contract .

## 2020-04-16 NOTE — Telephone Encounter (Signed)
Alexis [name] spoke with patient

## 2020-04-16 NOTE — Telephone Encounter (Signed)
Patient called she is currently in the hospital, she would like for the nurse to call her to discuss on how she can get the form to sign so she can get her Oxycodone  Please advice

## 2020-04-18 ENCOUNTER — Ambulatory Visit: Payer: HMO | Admitting: Podiatry

## 2020-04-18 DIAGNOSIS — E875 Hyperkalemia: Secondary | ICD-10-CM | POA: Diagnosis not present

## 2020-04-19 DIAGNOSIS — E875 Hyperkalemia: Secondary | ICD-10-CM | POA: Diagnosis not present

## 2020-04-21 ENCOUNTER — Other Ambulatory Visit: Payer: Self-pay | Admitting: Medical

## 2020-04-21 ENCOUNTER — Telehealth: Payer: Self-pay | Admitting: Medical

## 2020-04-21 DIAGNOSIS — R58 Hemorrhage, not elsewhere classified: Secondary | ICD-10-CM | POA: Diagnosis not present

## 2020-04-21 DIAGNOSIS — Z8719 Personal history of other diseases of the digestive system: Secondary | ICD-10-CM | POA: Diagnosis not present

## 2020-04-21 DIAGNOSIS — R9431 Abnormal electrocardiogram [ECG] [EKG]: Secondary | ICD-10-CM | POA: Diagnosis not present

## 2020-04-21 DIAGNOSIS — Z87891 Personal history of nicotine dependence: Secondary | ICD-10-CM | POA: Diagnosis not present

## 2020-04-21 DIAGNOSIS — Z4901 Encounter for fitting and adjustment of extracorporeal dialysis catheter: Secondary | ICD-10-CM | POA: Diagnosis not present

## 2020-04-21 DIAGNOSIS — I70211 Atherosclerosis of native arteries of extremities with intermittent claudication, right leg: Secondary | ICD-10-CM | POA: Diagnosis not present

## 2020-04-21 DIAGNOSIS — K5730 Diverticulosis of large intestine without perforation or abscess without bleeding: Secondary | ICD-10-CM | POA: Diagnosis not present

## 2020-04-21 DIAGNOSIS — N189 Chronic kidney disease, unspecified: Secondary | ICD-10-CM | POA: Diagnosis not present

## 2020-04-21 DIAGNOSIS — K648 Other hemorrhoids: Secondary | ICD-10-CM | POA: Diagnosis not present

## 2020-04-21 DIAGNOSIS — N19 Unspecified kidney failure: Secondary | ICD-10-CM | POA: Diagnosis not present

## 2020-04-21 DIAGNOSIS — R52 Pain, unspecified: Secondary | ICD-10-CM | POA: Diagnosis not present

## 2020-04-21 DIAGNOSIS — E1136 Type 2 diabetes mellitus with diabetic cataract: Secondary | ICD-10-CM | POA: Diagnosis not present

## 2020-04-21 DIAGNOSIS — K766 Portal hypertension: Secondary | ICD-10-CM | POA: Diagnosis not present

## 2020-04-21 DIAGNOSIS — I4821 Permanent atrial fibrillation: Secondary | ICD-10-CM | POA: Diagnosis not present

## 2020-04-21 DIAGNOSIS — J441 Chronic obstructive pulmonary disease with (acute) exacerbation: Secondary | ICD-10-CM | POA: Diagnosis not present

## 2020-04-21 DIAGNOSIS — K529 Noninfective gastroenteritis and colitis, unspecified: Secondary | ICD-10-CM | POA: Diagnosis not present

## 2020-04-21 DIAGNOSIS — E1152 Type 2 diabetes mellitus with diabetic peripheral angiopathy with gangrene: Secondary | ICD-10-CM | POA: Diagnosis not present

## 2020-04-21 DIAGNOSIS — E782 Mixed hyperlipidemia: Secondary | ICD-10-CM | POA: Diagnosis not present

## 2020-04-21 DIAGNOSIS — I739 Peripheral vascular disease, unspecified: Secondary | ICD-10-CM | POA: Diagnosis not present

## 2020-04-21 DIAGNOSIS — I132 Hypertensive heart and chronic kidney disease with heart failure and with stage 5 chronic kidney disease, or end stage renal disease: Secondary | ICD-10-CM | POA: Diagnosis not present

## 2020-04-21 DIAGNOSIS — Z515 Encounter for palliative care: Secondary | ICD-10-CM | POA: Diagnosis not present

## 2020-04-21 DIAGNOSIS — L89610 Pressure ulcer of right heel, unstageable: Secondary | ICD-10-CM | POA: Diagnosis not present

## 2020-04-21 DIAGNOSIS — E872 Acidosis: Secondary | ICD-10-CM | POA: Diagnosis not present

## 2020-04-21 DIAGNOSIS — D62 Acute posthemorrhagic anemia: Secondary | ICD-10-CM | POA: Diagnosis not present

## 2020-04-21 DIAGNOSIS — Z9889 Other specified postprocedural states: Secondary | ICD-10-CM | POA: Diagnosis not present

## 2020-04-21 DIAGNOSIS — E039 Hypothyroidism, unspecified: Secondary | ICD-10-CM | POA: Diagnosis not present

## 2020-04-21 DIAGNOSIS — N179 Acute kidney failure, unspecified: Secondary | ICD-10-CM | POA: Diagnosis not present

## 2020-04-21 DIAGNOSIS — I10 Essential (primary) hypertension: Secondary | ICD-10-CM | POA: Diagnosis not present

## 2020-04-21 DIAGNOSIS — R001 Bradycardia, unspecified: Secondary | ICD-10-CM | POA: Diagnosis not present

## 2020-04-21 DIAGNOSIS — K921 Melena: Secondary | ICD-10-CM | POA: Diagnosis not present

## 2020-04-21 DIAGNOSIS — E876 Hypokalemia: Secondary | ICD-10-CM | POA: Diagnosis not present

## 2020-04-21 DIAGNOSIS — R1110 Vomiting, unspecified: Secondary | ICD-10-CM | POA: Diagnosis not present

## 2020-04-21 DIAGNOSIS — K644 Residual hemorrhoidal skin tags: Secondary | ICD-10-CM | POA: Diagnosis not present

## 2020-04-21 DIAGNOSIS — Z95820 Peripheral vascular angioplasty status with implants and grafts: Secondary | ICD-10-CM | POA: Diagnosis not present

## 2020-04-21 DIAGNOSIS — I8510 Secondary esophageal varices without bleeding: Secondary | ICD-10-CM | POA: Diagnosis not present

## 2020-04-21 DIAGNOSIS — K922 Gastrointestinal hemorrhage, unspecified: Secondary | ICD-10-CM | POA: Diagnosis not present

## 2020-04-21 DIAGNOSIS — M79605 Pain in left leg: Secondary | ICD-10-CM | POA: Diagnosis not present

## 2020-04-21 DIAGNOSIS — K625 Hemorrhage of anus and rectum: Secondary | ICD-10-CM | POA: Diagnosis not present

## 2020-04-21 DIAGNOSIS — I96 Gangrene, not elsewhere classified: Secondary | ICD-10-CM | POA: Diagnosis not present

## 2020-04-21 DIAGNOSIS — I5022 Chronic systolic (congestive) heart failure: Secondary | ICD-10-CM | POA: Diagnosis not present

## 2020-04-21 DIAGNOSIS — I429 Cardiomyopathy, unspecified: Secondary | ICD-10-CM | POA: Diagnosis not present

## 2020-04-21 DIAGNOSIS — I130 Hypertensive heart and chronic kidney disease with heart failure and stage 1 through stage 4 chronic kidney disease, or unspecified chronic kidney disease: Secondary | ICD-10-CM | POA: Diagnosis not present

## 2020-04-21 DIAGNOSIS — N184 Chronic kidney disease, stage 4 (severe): Secondary | ICD-10-CM | POA: Diagnosis not present

## 2020-04-21 DIAGNOSIS — K7581 Nonalcoholic steatohepatitis (NASH): Secondary | ICD-10-CM | POA: Diagnosis not present

## 2020-04-21 DIAGNOSIS — Z20822 Contact with and (suspected) exposure to covid-19: Secondary | ICD-10-CM | POA: Diagnosis not present

## 2020-04-21 DIAGNOSIS — K7460 Unspecified cirrhosis of liver: Secondary | ICD-10-CM | POA: Diagnosis not present

## 2020-04-21 DIAGNOSIS — E1142 Type 2 diabetes mellitus with diabetic polyneuropathy: Secondary | ICD-10-CM | POA: Diagnosis not present

## 2020-04-21 DIAGNOSIS — K209 Esophagitis, unspecified without bleeding: Secondary | ICD-10-CM | POA: Diagnosis not present

## 2020-04-21 DIAGNOSIS — D631 Anemia in chronic kidney disease: Secondary | ICD-10-CM | POA: Diagnosis not present

## 2020-04-21 DIAGNOSIS — J984 Other disorders of lung: Secondary | ICD-10-CM | POA: Diagnosis not present

## 2020-04-21 DIAGNOSIS — E875 Hyperkalemia: Secondary | ICD-10-CM | POA: Diagnosis not present

## 2020-04-21 DIAGNOSIS — E1122 Type 2 diabetes mellitus with diabetic chronic kidney disease: Secondary | ICD-10-CM | POA: Diagnosis not present

## 2020-04-21 DIAGNOSIS — N186 End stage renal disease: Secondary | ICD-10-CM | POA: Diagnosis not present

## 2020-04-21 DIAGNOSIS — D696 Thrombocytopenia, unspecified: Secondary | ICD-10-CM | POA: Diagnosis not present

## 2020-04-21 DIAGNOSIS — T8189XD Other complications of procedures, not elsewhere classified, subsequent encounter: Secondary | ICD-10-CM | POA: Diagnosis not present

## 2020-04-21 DIAGNOSIS — L97419 Non-pressure chronic ulcer of right heel and midfoot with unspecified severity: Secondary | ICD-10-CM | POA: Diagnosis not present

## 2020-04-21 DIAGNOSIS — R410 Disorientation, unspecified: Secondary | ICD-10-CM | POA: Diagnosis not present

## 2020-04-21 DIAGNOSIS — Z992 Dependence on renal dialysis: Secondary | ICD-10-CM | POA: Diagnosis not present

## 2020-04-21 DIAGNOSIS — R2242 Localized swelling, mass and lump, left lower limb: Secondary | ICD-10-CM | POA: Diagnosis not present

## 2020-04-21 DIAGNOSIS — Z794 Long term (current) use of insulin: Secondary | ICD-10-CM | POA: Diagnosis not present

## 2020-04-21 DIAGNOSIS — E871 Hypo-osmolality and hyponatremia: Secondary | ICD-10-CM | POA: Diagnosis not present

## 2020-04-21 DIAGNOSIS — J449 Chronic obstructive pulmonary disease, unspecified: Secondary | ICD-10-CM | POA: Diagnosis not present

## 2020-04-21 DIAGNOSIS — R531 Weakness: Secondary | ICD-10-CM | POA: Diagnosis not present

## 2020-04-21 DIAGNOSIS — X58XXXD Exposure to other specified factors, subsequent encounter: Secondary | ICD-10-CM | POA: Diagnosis not present

## 2020-04-21 DIAGNOSIS — K7469 Other cirrhosis of liver: Secondary | ICD-10-CM | POA: Diagnosis not present

## 2020-04-21 DIAGNOSIS — K3189 Other diseases of stomach and duodenum: Secondary | ICD-10-CM | POA: Diagnosis not present

## 2020-04-21 DIAGNOSIS — D649 Anemia, unspecified: Secondary | ICD-10-CM | POA: Diagnosis not present

## 2020-04-21 DIAGNOSIS — I5030 Unspecified diastolic (congestive) heart failure: Secondary | ICD-10-CM | POA: Diagnosis not present

## 2020-04-21 MED ORDER — OXYCODONE HCL 10 MG PO TABS: ORAL_TABLET | ORAL | 0 refills | Status: DC

## 2020-04-21 NOTE — Telephone Encounter (Signed)
Please deny patient's refill since it was filled on 04/14/2020

## 2020-04-21 NOTE — Telephone Encounter (Signed)
Patient called in reference to medication again and takes medicine TID and received medication from pharmacy on 04/14/2020.

## 2020-04-21 NOTE — Telephone Encounter (Signed)
Patient wants more medicine claims it was only 15 days and pt is out of meds

## 2020-04-21 NOTE — Telephone Encounter (Signed)
Agree with advise for pt be seen in ED. She has had significant drop in hb and hematocrit recently. Sounds like significant amount of bleeding.

## 2020-04-21 NOTE — Telephone Encounter (Signed)
Called to notify patient .

## 2020-04-21 NOTE — Telephone Encounter (Signed)
At them time pain medication prescribed she was not on contract. When she finishes 5 days of pain med can prescribe 30 day prescription. Since 5 day rx she did sign contract. Is she already out the 5 day supply? She just recently got out of hospital.

## 2020-04-21 NOTE — Telephone Encounter (Signed)
Script sent in 04/14/20 , called pt and lvm to return call to notify her

## 2020-04-21 NOTE — Telephone Encounter (Signed)
Patient states she would like to go to [facility] with medication

## 2020-04-21 NOTE — Telephone Encounter (Signed)
Medication: Oxycodone HCl 10 MG TABS [[id number]   Has the patient contacted their pharmacy? No. (If no, request that the patient contact the pharmacy for the refill.) (If yes, when and what did the pharmacy advise?)  Preferred Pharmacy (with phone number or street name): [facility]  [id number], [place], [place],  [address] Hours:  Open ? Closes 6:30PM [phone] Agent: Please be advised that RX refills may take up to 3 business days. We ask that you follow-up with your pharmacy.

## 2020-04-21 NOTE — Telephone Encounter (Signed)
I would prefer to [facility] to [facility]. The controlled med contract has  [facility] listed. Also it is so late in day and I don't know what time local pharmacy closes. Let pt know can send to [facility]. They will deliver tomorrow.

## 2020-04-21 NOTE — Telephone Encounter (Signed)
Yes she is out of that supply and also her daughter brung the contact back and it was sent to scan. If you send in medicine tonight she wants it to go [facility] instead of [facility]

## 2020-04-21 NOTE — Telephone Encounter (Signed)
Patient called back stating [facility] hasn't received the medication yet

## 2020-04-21 NOTE — Telephone Encounter (Signed)
Caller : Benjamine Mola -[facility]  Call Back @ [phone]  Patient has rectal bleeding, patient is on blood thinners. Patient is refusing the ED. Benjamine Mola would like a call back to discuss other options

## 2020-04-21 NOTE — Telephone Encounter (Signed)
Spoke with [facility] and they stated Alexis [name] stated she was Bleeding either vaginal or rectal for 2days , in urine and stool , fills pot up , was on IV Herapin while in the hospital and pt is taking Eliquis and plavix.     Called pt and advised patient to go to ER after [facility] advised her as well and she stated she doesn't have a way to the ER  and unaware on when she will have a ride.

## 2020-04-21 NOTE — Telephone Encounter (Signed)
Rx oxycodone sent to pt pharmacy.

## 2020-04-22 NOTE — Telephone Encounter (Signed)
Meds sent to [facility] yesterday , patient was notified but she also stated she was in the hospital as well

## 2020-04-25 ENCOUNTER — Inpatient Hospital Stay: Payer: HMO | Admitting: Medical

## 2020-04-29 DIAGNOSIS — N186 End stage renal disease: Secondary | ICD-10-CM | POA: Diagnosis not present

## 2020-04-29 DIAGNOSIS — I10 Essential (primary) hypertension: Secondary | ICD-10-CM | POA: Diagnosis not present

## 2020-04-29 DIAGNOSIS — D631 Anemia in chronic kidney disease: Secondary | ICD-10-CM | POA: Diagnosis not present

## 2020-05-02 ENCOUNTER — Telehealth: Payer: Self-pay | Admitting: Medical

## 2020-05-02 NOTE — Telephone Encounter (Signed)
Patient just wanted to let Alexis [name] know she was being discharged from the hospital

## 2020-05-02 NOTE — Telephone Encounter (Signed)
Patient is requesting a call back from [facility]

## 2020-05-03 DIAGNOSIS — E875 Hyperkalemia: Secondary | ICD-10-CM | POA: Diagnosis not present

## 2020-05-05 ENCOUNTER — Telehealth: Payer: Self-pay | Admitting: Medical

## 2020-05-05 NOTE — Telephone Encounter (Signed)
VO given.

## 2020-05-05 NOTE — Telephone Encounter (Signed)
Can verbal Alexis [name] but have them follow up by sending in order for me to sign.

## 2020-05-05 NOTE — Telephone Encounter (Signed)
[facility]  Call Back @ [phone]  Requesting OT for patient   2*3

## 2020-05-06 ENCOUNTER — Telehealth: Payer: Self-pay

## 2020-05-06 ENCOUNTER — Telehealth: Payer: Self-pay | Admitting: Medical

## 2020-05-06 DIAGNOSIS — Z992 Dependence on renal dialysis: Secondary | ICD-10-CM | POA: Diagnosis not present

## 2020-05-06 DIAGNOSIS — J449 Chronic obstructive pulmonary disease, unspecified: Secondary | ICD-10-CM | POA: Diagnosis not present

## 2020-05-06 DIAGNOSIS — R279 Unspecified lack of coordination: Secondary | ICD-10-CM | POA: Diagnosis not present

## 2020-05-06 DIAGNOSIS — K766 Portal hypertension: Secondary | ICD-10-CM | POA: Diagnosis not present

## 2020-05-06 DIAGNOSIS — D509 Iron deficiency anemia, unspecified: Secondary | ICD-10-CM | POA: Diagnosis not present

## 2020-05-06 DIAGNOSIS — I2510 Atherosclerotic heart disease of native coronary artery without angina pectoris: Secondary | ICD-10-CM | POA: Diagnosis not present

## 2020-05-06 DIAGNOSIS — M8619 Other acute osteomyelitis, multiple sites: Secondary | ICD-10-CM | POA: Diagnosis not present

## 2020-05-06 DIAGNOSIS — E11628 Type 2 diabetes mellitus with other skin complications: Secondary | ICD-10-CM | POA: Diagnosis not present

## 2020-05-06 DIAGNOSIS — E1122 Type 2 diabetes mellitus with diabetic chronic kidney disease: Secondary | ICD-10-CM | POA: Diagnosis not present

## 2020-05-06 DIAGNOSIS — M8669 Other chronic osteomyelitis, multiple sites: Secondary | ICD-10-CM | POA: Diagnosis not present

## 2020-05-06 DIAGNOSIS — M069 Rheumatoid arthritis, unspecified: Secondary | ICD-10-CM | POA: Diagnosis not present

## 2020-05-06 DIAGNOSIS — I4891 Unspecified atrial fibrillation: Secondary | ICD-10-CM | POA: Diagnosis not present

## 2020-05-06 DIAGNOSIS — I959 Hypotension, unspecified: Secondary | ICD-10-CM | POA: Diagnosis not present

## 2020-05-06 DIAGNOSIS — E1140 Type 2 diabetes mellitus with diabetic neuropathy, unspecified: Secondary | ICD-10-CM | POA: Diagnosis not present

## 2020-05-06 DIAGNOSIS — K7460 Unspecified cirrhosis of liver: Secondary | ICD-10-CM | POA: Diagnosis not present

## 2020-05-06 DIAGNOSIS — Z4781 Encounter for orthopedic aftercare following surgical amputation: Secondary | ICD-10-CM | POA: Diagnosis not present

## 2020-05-06 DIAGNOSIS — E039 Hypothyroidism, unspecified: Secondary | ICD-10-CM | POA: Diagnosis not present

## 2020-05-06 DIAGNOSIS — I10 Essential (primary) hypertension: Secondary | ICD-10-CM | POA: Diagnosis not present

## 2020-05-06 DIAGNOSIS — I509 Heart failure, unspecified: Secondary | ICD-10-CM | POA: Diagnosis not present

## 2020-05-06 DIAGNOSIS — I5032 Chronic diastolic (congestive) heart failure: Secondary | ICD-10-CM | POA: Diagnosis not present

## 2020-05-06 DIAGNOSIS — E1142 Type 2 diabetes mellitus with diabetic polyneuropathy: Secondary | ICD-10-CM | POA: Diagnosis not present

## 2020-05-06 DIAGNOSIS — I96 Gangrene, not elsewhere classified: Secondary | ICD-10-CM | POA: Diagnosis not present

## 2020-05-06 DIAGNOSIS — E1152 Type 2 diabetes mellitus with diabetic peripheral angiopathy with gangrene: Secondary | ICD-10-CM | POA: Diagnosis not present

## 2020-05-06 DIAGNOSIS — Z794 Long term (current) use of insulin: Secondary | ICD-10-CM | POA: Diagnosis not present

## 2020-05-06 DIAGNOSIS — E559 Vitamin D deficiency, unspecified: Secondary | ICD-10-CM | POA: Diagnosis not present

## 2020-05-06 DIAGNOSIS — I132 Hypertensive heart and chronic kidney disease with heart failure and with stage 5 chronic kidney disease, or end stage renal disease: Secondary | ICD-10-CM | POA: Diagnosis not present

## 2020-05-06 DIAGNOSIS — E11621 Type 2 diabetes mellitus with foot ulcer: Secondary | ICD-10-CM | POA: Diagnosis not present

## 2020-05-06 DIAGNOSIS — I8510 Secondary esophageal varices without bleeding: Secondary | ICD-10-CM | POA: Diagnosis not present

## 2020-05-06 DIAGNOSIS — Z20822 Contact with and (suspected) exposure to covid-19: Secondary | ICD-10-CM | POA: Diagnosis not present

## 2020-05-06 DIAGNOSIS — E119 Type 2 diabetes mellitus without complications: Secondary | ICD-10-CM | POA: Diagnosis not present

## 2020-05-06 DIAGNOSIS — E1149 Type 2 diabetes mellitus with other diabetic neurological complication: Secondary | ICD-10-CM | POA: Diagnosis not present

## 2020-05-06 DIAGNOSIS — S91301A Unspecified open wound, right foot, initial encounter: Secondary | ICD-10-CM | POA: Diagnosis not present

## 2020-05-06 DIAGNOSIS — D649 Anemia, unspecified: Secondary | ICD-10-CM | POA: Diagnosis not present

## 2020-05-06 DIAGNOSIS — I70201 Unspecified atherosclerosis of native arteries of extremities, right leg: Secondary | ICD-10-CM | POA: Diagnosis not present

## 2020-05-06 DIAGNOSIS — Z743 Need for continuous supervision: Secondary | ICD-10-CM | POA: Diagnosis not present

## 2020-05-06 DIAGNOSIS — M109 Gout, unspecified: Secondary | ICD-10-CM | POA: Diagnosis not present

## 2020-05-06 DIAGNOSIS — M86271 Subacute osteomyelitis, right ankle and foot: Secondary | ICD-10-CM | POA: Diagnosis not present

## 2020-05-06 DIAGNOSIS — E876 Hypokalemia: Secondary | ICD-10-CM | POA: Diagnosis not present

## 2020-05-06 DIAGNOSIS — I4821 Permanent atrial fibrillation: Secondary | ICD-10-CM | POA: Diagnosis not present

## 2020-05-06 DIAGNOSIS — I8500 Esophageal varices without bleeding: Secondary | ICD-10-CM | POA: Diagnosis not present

## 2020-05-06 DIAGNOSIS — I2729 Other secondary pulmonary hypertension: Secondary | ICD-10-CM | POA: Diagnosis not present

## 2020-05-06 DIAGNOSIS — J45909 Unspecified asthma, uncomplicated: Secondary | ICD-10-CM | POA: Diagnosis not present

## 2020-05-06 DIAGNOSIS — E1169 Type 2 diabetes mellitus with other specified complication: Secondary | ICD-10-CM | POA: Diagnosis not present

## 2020-05-06 DIAGNOSIS — E871 Hypo-osmolality and hyponatremia: Secondary | ICD-10-CM | POA: Diagnosis not present

## 2020-05-06 DIAGNOSIS — I252 Old myocardial infarction: Secondary | ICD-10-CM | POA: Diagnosis not present

## 2020-05-06 DIAGNOSIS — I4819 Other persistent atrial fibrillation: Secondary | ICD-10-CM | POA: Diagnosis not present

## 2020-05-06 DIAGNOSIS — N186 End stage renal disease: Secondary | ICD-10-CM | POA: Diagnosis not present

## 2020-05-06 DIAGNOSIS — N179 Acute kidney failure, unspecified: Secondary | ICD-10-CM | POA: Diagnosis not present

## 2020-05-06 DIAGNOSIS — D696 Thrombocytopenia, unspecified: Secondary | ICD-10-CM | POA: Diagnosis not present

## 2020-05-06 DIAGNOSIS — N2581 Secondary hyperparathyroidism of renal origin: Secondary | ICD-10-CM | POA: Diagnosis not present

## 2020-05-06 DIAGNOSIS — Z89511 Acquired absence of right leg below knee: Secondary | ICD-10-CM | POA: Diagnosis not present

## 2020-05-06 DIAGNOSIS — E1165 Type 2 diabetes mellitus with hyperglycemia: Secondary | ICD-10-CM | POA: Diagnosis not present

## 2020-05-06 DIAGNOSIS — Z23 Encounter for immunization: Secondary | ICD-10-CM | POA: Diagnosis not present

## 2020-05-06 DIAGNOSIS — K3189 Other diseases of stomach and duodenum: Secondary | ICD-10-CM | POA: Diagnosis not present

## 2020-05-06 DIAGNOSIS — R531 Weakness: Secondary | ICD-10-CM | POA: Diagnosis not present

## 2020-05-06 DIAGNOSIS — Z87891 Personal history of nicotine dependence: Secondary | ICD-10-CM | POA: Diagnosis not present

## 2020-05-06 DIAGNOSIS — E785 Hyperlipidemia, unspecified: Secondary | ICD-10-CM | POA: Diagnosis not present

## 2020-05-06 DIAGNOSIS — D631 Anemia in chronic kidney disease: Secondary | ICD-10-CM | POA: Diagnosis not present

## 2020-05-06 DIAGNOSIS — E1151 Type 2 diabetes mellitus with diabetic peripheral angiopathy without gangrene: Secondary | ICD-10-CM | POA: Diagnosis not present

## 2020-05-06 DIAGNOSIS — L97418 Non-pressure chronic ulcer of right heel and midfoot with other specified severity: Secondary | ICD-10-CM | POA: Diagnosis not present

## 2020-05-06 DIAGNOSIS — A480 Gas gangrene: Secondary | ICD-10-CM | POA: Diagnosis not present

## 2020-05-06 NOTE — Telephone Encounter (Signed)
I discussed with Dr. Zigmund Daniel from [facility] today.  Long  conversation regarding patient's recurrent hospitalizations and her multiple comorbidities.   Dr.Matthews was stating that she is going to discuss palliative care versus hospice care with [facility].   I did agree that hospice or palliative care was reasonable.  Will ask medical assistant to get [facility] scheduled for virtual visit this this week or latest on Monday so can discuss.

## 2020-05-06 NOTE — Telephone Encounter (Signed)
Dr.Matthews from [facility] called and spoke with Percell Miller about patient

## 2020-05-07 ENCOUNTER — Telehealth: Payer: Self-pay | Admitting: Pharmacist

## 2020-05-07 NOTE — Telephone Encounter (Signed)
Called Alexis [name] and her daughter picked up the phone and she stated [name] is about to undergo surgery today to get her R leg amputated

## 2020-05-07 NOTE — Telephone Encounter (Signed)
Open to review.

## 2020-05-07 NOTE — Progress Notes (Addendum)
Chronic Care Management Pharmacy Assistant   Name: Alexis [name]  MRN: [id number] DOB: 12/18/1950  Reason for Encounter: Patient Call/Chart Review  Recent office visits:  04/11/20  Alexis Goodell, PA-C. For CHF. Chest Xray. STARTED Doxycyline 100 mg 2 times daily and Nystatin 1000000 Unit/GM    Recent consult visits: None since 04/11/20  Hospital visits:  05/06/20 Current .No information given at this time. Patient called and informed us that she is scheduled for a right leg amputation on 05/08/20. I will call patient in next week for an updated medication list.  04/21/20 For rectal Bleeding. START epoetin alfa-epbx (RETACRIT) 20,000 unit/mL injection Inject 1 mL (20,000 Units total) into the skin every 7 days,  midodrine (PROAMATINE) 5 MG 1 tablet daily, torsemide (DEMADEX) 20 MG 2 tablets daily. STOPPED doxycycline hyclate 100 MG tablet, furosemide (LASIX) 20 MG tablet, losartan (COZAAR) 25 MG tablet, spironolactone (ALDACTONE) 100 MG tablet, and ciprofloxacin HCl (CIPRO) 500 MG tablet Discharged 05/01/20.  04/12/20 For Abnormal lab. STARTED Ciprofloxacin 500 mg and Fluconazole STOPPED lidocaine HCL 4 % lqro, B complex-vitamin C-folic acid (NEPHRO-VITE) 0.8 mg Tab tablet, midodrine (PROAMATINE) 10 MG tablet, triamcinolone (KENALOG) 0.1 % cream.Discharged 04/17/20  04/11/20 No information given.  Medications: Outpatient Encounter Medications as of 05/07/2020  Medication Sig Note   acetaminophen (TYLENOL) 500 MG tablet Take by mouth.    albuterol (VENTOLIN HFA) 108 (90 Base) MCG/ACT inhaler Inhale 2 puffs into the lungs every 6 (six) hours as needed for wheezing or shortness of breath.    allopurinol (ZYLOPRIM) 100 MG tablet Take 1 tablet (100 mg total) by mouth 2 (two) times daily.    AMBULATORY NON FORMULARY MEDICATION Motorized scooter.  Diagnosis: Osteoarthritis M19.90 06/05/2019: Reports her scooter and has some mechanical issues and a technician is scheduled to come and look at it this  week.   atorvastatin (LIPITOR) 10 MG tablet TAKE ONE TABLET BY MOUTH EVERY MORNING    budesonide-formoterol (SYMBICORT) 160-4.5 MCG/ACT inhaler Inhale 2 puffs into the lungs 2 (two) times daily.    cephALEXin (KEFLEX) 500 MG capsule Take 1 capsule (500 mg total) by mouth 2 (two) times daily.    clopidogrel (PLAVIX) 75 MG tablet TAKE ONE TABLET BY MOUTH EVERY EVENING    Continuous Blood Gluc Receiver (FREESTYLE LIBRE 14 DAY READER) DEVI Use as directed to monitor BG.  Dx E11.65    Continuous Blood Gluc Sensor (FREESTYLE LIBRE 14 DAY SENSOR) MISC INJECT INTO THE SKIN EVERY 14 DAYS    doxycycline (VIBRA-TABS) 100 MG tablet Take 1 tablet (100 mg total) by mouth 2 (two) times daily.    ELIQUIS 5 MG TABS tablet Take 5 mg by mouth 2 (two) times daily.    fluticasone (FLONASE) 50 MCG/ACT nasal spray Place 2 sprays into both nostrils at bedtime. (Patient taking differently: Place 2 sprays into both nostrils in the morning and at bedtime. )    furosemide (LASIX) 20 MG tablet Take two tablets by mouth in the morning and one tablet by mouth at noon.    gabapentin (NEURONTIN) 300 MG capsule Take 300 mg by mouth 3 (three) times daily. (Patient not taking: Reported on 01/01/2020) 01/01/2020: Developed tremors   Insulin Aspart FlexPen 100 UNIT/ML SOPN Inject 0-10 Units into the skin daily as needed (If glucose if over 200). Sliding scale    insulin degludec (TRESIBA) 100 UNIT/ML FlexTouch Pen Inject 14 Units into the skin daily.  11/06/2019: Client state she takes 20 units daily   Insulin Pen Needle (  PEN NEEDLES 31GX5/16") 31G X 8 MM MISC Use as needed 4 times/day.  Dx E11.65    levocetirizine (XYZAL) 5 MG tablet Take 1 tablet (5 mg total) by mouth every evening.    levothyroxine (SYNTHROID) 150 MCG tablet Take 150 mcg by mouth daily before breakfast.     lidocaine (XYLOCAINE) 4 % external solution Apply topically.    losartan (COZAAR) 25 MG tablet Take 1 tablet (25 mg total) by mouth daily.    Melatonin 10 MG TABS  Take 20 mg by mouth at bedtime.     methocarbamol (ROBAXIN) 500 MG tablet 1 tab po bid    metoprolol succinate (TOPROL-XL) 25 MG 24 hr tablet TAKE ONE (1) TABLET BY MOUTH EVERY DAY    nystatin cream (MYCOSTATIN) Apply 1 application topically 2 (two) times daily.    ondansetron (ZOFRAN-ODT) 4 MG disintegrating tablet Take 4 mg by mouth every 8 (eight) hours as needed for nausea or vomiting.     ONETOUCH VERIO test strip 3 (three) times daily.    Oxycodone HCl 10 MG TABS 1 tab po tid prn severe pain    Prenatal Vit-Fe Fumarate-FA (MULTIVITAMIN-PRENATAL) 27-0.8 MG TABS tablet Take 1 tablet by mouth daily.     Probiotic CAPS Take 2 capsules by mouth daily. 50 billion (gummy)    RABEprazole (ACIPHEX) 20 MG tablet Take 1 tablet (20 mg total) by mouth daily.    sertraline (ZOLOFT) 50 MG tablet Take 1 tablet (50 mg total) by mouth daily. (Patient not taking: No sig reported) 03/06/2020: Patient states made her sick. Stopped taking on 03/04/20   spironolactone (ALDACTONE) 100 MG tablet Take 1 tablet (100 mg total) by mouth daily.    tobramycin (TOBREX) 0.3 % ophthalmic solution Place 2 drops into both eyes every 6 (six) hours.    triamcinolone ointment (KENALOG) 0.1 % Apply to    No facility-administered encounter medications on file as of 05/07/2020.   Patient called Alexis [name] to inform her and the pharmacist that she Is currently admitted into the hospital and she would be undergoing surgery for a right leg amputation on 05/08/20. Reviewed the patients chart and noticed she was in the hospital a few times previous from this current admission so I added the needed information in this encounter for you to review. As said above I will call the patient next week to see how she is doing and get an updated medication list.  Follow-Up: Pharmacist Review:   Charlann Lange, RMA Clinical Pharmacist Assistant [phone]  10 minutes spent in review, coordination, and documentation.  Reviewed by: Beverly Milch,  PharmD Clinical Pharmacist [facility] [phone]

## 2020-05-08 NOTE — Telephone Encounter (Signed)
Will you call her next Monday. Expect she will be back from hospital by then and get her scheduled for followup. Thanks

## 2020-05-12 DIAGNOSIS — I4821 Permanent atrial fibrillation: Secondary | ICD-10-CM | POA: Diagnosis not present

## 2020-05-12 DIAGNOSIS — I5032 Chronic diastolic (congestive) heart failure: Secondary | ICD-10-CM | POA: Diagnosis not present

## 2020-05-12 DIAGNOSIS — R531 Weakness: Secondary | ICD-10-CM | POA: Diagnosis not present

## 2020-05-12 DIAGNOSIS — Z4781 Encounter for orthopedic aftercare following surgical amputation: Secondary | ICD-10-CM | POA: Diagnosis not present

## 2020-05-12 DIAGNOSIS — E1140 Type 2 diabetes mellitus with diabetic neuropathy, unspecified: Secondary | ICD-10-CM | POA: Diagnosis not present

## 2020-05-12 DIAGNOSIS — L97509 Non-pressure chronic ulcer of other part of unspecified foot with unspecified severity: Secondary | ICD-10-CM | POA: Diagnosis not present

## 2020-05-12 DIAGNOSIS — I132 Hypertensive heart and chronic kidney disease with heart failure and with stage 5 chronic kidney disease, or end stage renal disease: Secondary | ICD-10-CM | POA: Diagnosis not present

## 2020-05-12 DIAGNOSIS — K7469 Other cirrhosis of liver: Secondary | ICD-10-CM | POA: Diagnosis not present

## 2020-05-12 DIAGNOSIS — D649 Anemia, unspecified: Secondary | ICD-10-CM | POA: Diagnosis not present

## 2020-05-12 DIAGNOSIS — I10 Essential (primary) hypertension: Secondary | ICD-10-CM | POA: Diagnosis not present

## 2020-05-12 DIAGNOSIS — D631 Anemia in chronic kidney disease: Secondary | ICD-10-CM | POA: Diagnosis not present

## 2020-05-12 DIAGNOSIS — E119 Type 2 diabetes mellitus without complications: Secondary | ICD-10-CM | POA: Diagnosis not present

## 2020-05-12 DIAGNOSIS — I4819 Other persistent atrial fibrillation: Secondary | ICD-10-CM | POA: Diagnosis not present

## 2020-05-12 DIAGNOSIS — E875 Hyperkalemia: Secondary | ICD-10-CM | POA: Diagnosis not present

## 2020-05-12 DIAGNOSIS — I5022 Chronic systolic (congestive) heart failure: Secondary | ICD-10-CM | POA: Diagnosis not present

## 2020-05-12 DIAGNOSIS — G8929 Other chronic pain: Secondary | ICD-10-CM | POA: Diagnosis not present

## 2020-05-12 DIAGNOSIS — E785 Hyperlipidemia, unspecified: Secondary | ICD-10-CM | POA: Diagnosis not present

## 2020-05-12 DIAGNOSIS — D500 Iron deficiency anemia secondary to blood loss (chronic): Secondary | ICD-10-CM | POA: Diagnosis not present

## 2020-05-12 DIAGNOSIS — E1122 Type 2 diabetes mellitus with diabetic chronic kidney disease: Secondary | ICD-10-CM | POA: Diagnosis not present

## 2020-05-12 DIAGNOSIS — E1151 Type 2 diabetes mellitus with diabetic peripheral angiopathy without gangrene: Secondary | ICD-10-CM | POA: Diagnosis not present

## 2020-05-12 DIAGNOSIS — N186 End stage renal disease: Secondary | ICD-10-CM | POA: Diagnosis not present

## 2020-05-12 DIAGNOSIS — K7460 Unspecified cirrhosis of liver: Secondary | ICD-10-CM | POA: Diagnosis not present

## 2020-05-12 DIAGNOSIS — Z992 Dependence on renal dialysis: Secondary | ICD-10-CM | POA: Diagnosis not present

## 2020-05-12 DIAGNOSIS — Z743 Need for continuous supervision: Secondary | ICD-10-CM | POA: Diagnosis not present

## 2020-05-12 DIAGNOSIS — J45909 Unspecified asthma, uncomplicated: Secondary | ICD-10-CM | POA: Diagnosis not present

## 2020-05-12 DIAGNOSIS — M109 Gout, unspecified: Secondary | ICD-10-CM | POA: Diagnosis not present

## 2020-05-12 DIAGNOSIS — E11621 Type 2 diabetes mellitus with foot ulcer: Secondary | ICD-10-CM | POA: Diagnosis not present

## 2020-05-12 DIAGNOSIS — Z89511 Acquired absence of right leg below knee: Secondary | ICD-10-CM | POA: Diagnosis not present

## 2020-05-12 DIAGNOSIS — E559 Vitamin D deficiency, unspecified: Secondary | ICD-10-CM | POA: Diagnosis not present

## 2020-05-12 DIAGNOSIS — E039 Hypothyroidism, unspecified: Secondary | ICD-10-CM | POA: Diagnosis not present

## 2020-05-12 DIAGNOSIS — I8500 Esophageal varices without bleeding: Secondary | ICD-10-CM | POA: Diagnosis not present

## 2020-05-12 DIAGNOSIS — Z794 Long term (current) use of insulin: Secondary | ICD-10-CM | POA: Diagnosis not present

## 2020-05-12 DIAGNOSIS — R279 Unspecified lack of coordination: Secondary | ICD-10-CM | POA: Diagnosis not present

## 2020-05-12 DIAGNOSIS — M069 Rheumatoid arthritis, unspecified: Secondary | ICD-10-CM | POA: Diagnosis not present

## 2020-05-12 DIAGNOSIS — N189 Chronic kidney disease, unspecified: Secondary | ICD-10-CM | POA: Diagnosis not present

## 2020-05-12 DIAGNOSIS — I480 Paroxysmal atrial fibrillation: Secondary | ICD-10-CM | POA: Diagnosis not present

## 2020-05-12 DIAGNOSIS — M86271 Subacute osteomyelitis, right ankle and foot: Secondary | ICD-10-CM | POA: Diagnosis not present

## 2020-05-12 DIAGNOSIS — J449 Chronic obstructive pulmonary disease, unspecified: Secondary | ICD-10-CM | POA: Diagnosis not present

## 2020-05-12 DIAGNOSIS — I2510 Atherosclerotic heart disease of native coronary artery without angina pectoris: Secondary | ICD-10-CM | POA: Diagnosis not present

## 2020-05-12 DIAGNOSIS — F32A Depression, unspecified: Secondary | ICD-10-CM | POA: Diagnosis not present

## 2020-05-15 ENCOUNTER — Telehealth: Payer: Self-pay | Admitting: Pharmacist

## 2020-05-15 NOTE — Telephone Encounter (Signed)
Appointment scheduled.

## 2020-05-15 NOTE — Progress Notes (Addendum)
Chronic Care Management Pharmacy Assistant   Name: Alexis [name]  MRN: [id number] DOB: 10-17-1950  Reason for Encounter: General Disease State Call   Conditions to be addressed/monitored: CHF, HTN, HLD, COPD, DMII and Depression  Recent office visits:  None since 05/07/20  Recent consult visits:  None since 05/07/20  Hospital visits:  No updated information about Mrs.Kral stay at this time from her admission on 05/06/20.  Medications: Outpatient Encounter Medications as of 05/15/2020  Medication Sig Note   acetaminophen (TYLENOL) 500 MG tablet Take by mouth.    albuterol (VENTOLIN HFA) 108 (90 Base) MCG/ACT inhaler Inhale 2 puffs into the lungs every 6 (six) hours as needed for wheezing or shortness of breath.    allopurinol (ZYLOPRIM) 100 MG tablet Take 1 tablet (100 mg total) by mouth 2 (two) times daily.    AMBULATORY NON FORMULARY MEDICATION Motorized scooter.  Diagnosis: Osteoarthritis M19.90 06/05/2019: Reports her scooter and has some mechanical issues and a technician is scheduled to come and look at it this week.   atorvastatin (LIPITOR) 10 MG tablet TAKE ONE TABLET BY MOUTH EVERY MORNING    budesonide-formoterol (SYMBICORT) 160-4.5 MCG/ACT inhaler Inhale 2 puffs into the lungs 2 (two) times daily.    cephALEXin (KEFLEX) 500 MG capsule Take 1 capsule (500 mg total) by mouth 2 (two) times daily.    clopidogrel (PLAVIX) 75 MG tablet TAKE ONE TABLET BY MOUTH EVERY EVENING    Continuous Blood Gluc Receiver (FREESTYLE LIBRE 14 DAY READER) DEVI Use as directed to monitor BG.  Dx E11.65    Continuous Blood Gluc Sensor (FREESTYLE LIBRE 14 DAY SENSOR) MISC INJECT INTO THE SKIN EVERY 14 DAYS    doxycycline (VIBRA-TABS) 100 MG tablet Take 1 tablet (100 mg total) by mouth 2 (two) times daily.    ELIQUIS 5 MG TABS tablet Take 5 mg by mouth 2 (two) times daily.    fluticasone (FLONASE) 50 MCG/ACT nasal spray Place 2 sprays into both nostrils at bedtime. (Patient taking differently:  Place 2 sprays into both nostrils in the morning and at bedtime. )    furosemide (LASIX) 20 MG tablet Take two tablets by mouth in the morning and one tablet by mouth at noon.    gabapentin (NEURONTIN) 300 MG capsule Take 300 mg by mouth 3 (three) times daily. (Patient not taking: Reported on 01/01/2020) 01/01/2020: Developed tremors   Insulin Aspart FlexPen 100 UNIT/ML SOPN Inject 0-10 Units into the skin daily as needed (If glucose if over 200). Sliding scale    insulin degludec (TRESIBA) 100 UNIT/ML FlexTouch Pen Inject 14 Units into the skin daily.  11/06/2019: Client state she takes 20 units daily   Insulin Pen Needle (PEN NEEDLES 31GX5/16") 31G X 8 MM MISC Use as needed 4 times/day.  Dx E11.65    levocetirizine (XYZAL) 5 MG tablet Take 1 tablet (5 mg total) by mouth every evening.    levothyroxine (SYNTHROID) 150 MCG tablet Take 150 mcg by mouth daily before breakfast.     lidocaine (XYLOCAINE) 4 % external solution Apply topically.    losartan (COZAAR) 25 MG tablet Take 1 tablet (25 mg total) by mouth daily.    Melatonin 10 MG TABS Take 20 mg by mouth at bedtime.     methocarbamol (ROBAXIN) 500 MG tablet 1 tab po bid    metoprolol succinate (TOPROL-XL) 25 MG 24 hr tablet TAKE ONE (1) TABLET BY MOUTH EVERY DAY    nystatin cream (MYCOSTATIN) Apply 1 application topically 2 (two)  times daily.    ondansetron (ZOFRAN-ODT) 4 MG disintegrating tablet Take 4 mg by mouth every 8 (eight) hours as needed for nausea or vomiting.     ONETOUCH VERIO test strip 3 (three) times daily.    Oxycodone HCl 10 MG TABS 1 tab po tid prn severe pain    Prenatal Vit-Fe Fumarate-FA (MULTIVITAMIN-PRENATAL) 27-0.8 MG TABS tablet Take 1 tablet by mouth daily.     Probiotic CAPS Take 2 capsules by mouth daily. 50 billion (gummy)    RABEprazole (ACIPHEX) 20 MG tablet Take 1 tablet (20 mg total) by mouth daily.    sertraline (ZOLOFT) 50 MG tablet Take 1 tablet (50 mg total) by mouth daily. (Patient not taking: No sig  reported) 03/06/2020: Patient states made her sick. Stopped taking on 03/04/20   spironolactone (ALDACTONE) 100 MG tablet Take 1 tablet (100 mg total) by mouth daily.    tobramycin (TOBREX) 0.3 % ophthalmic solution Place 2 drops into both eyes every 6 (six) hours.    triamcinolone ointment (KENALOG) 0.1 % Apply to    No facility-administered encounter medications on file as of 05/15/2020.   Patient stated she's doing fine just in a lot of pain. Patient stated she's been at the rehab location since Monday and there is talk about moving her because she is too far away. She stated she is doing some therapy but not much. She stated she knows she is on Lyrica as a continue medication but was not sure about anything else. Our conversation was cut short because the nurses came in to give her a bath.    Follow-Up: Pharmacist Review  Charlann Lange, RMA Clinical Pharmacist Assistant [phone]  5 minutes spent in review, coordination, and documentation.  Reviewed by: Beverly Milch, PharmD Clinical Pharmacist [facility] [phone]

## 2020-05-16 ENCOUNTER — Telehealth: Payer: Self-pay | Admitting: Pharmacist

## 2020-05-16 NOTE — Progress Notes (Addendum)
Chronic Care Management Pharmacy Assistant   Name: Alexis [name]  MRN: [id number] DOB: 1950/06/05  Reason for Encounter: Medication Review  Medications: Outpatient Encounter Medications as of 05/16/2020  Medication Sig Note   acetaminophen (TYLENOL) 500 MG tablet Take by mouth.    albuterol (VENTOLIN HFA) 108 (90 Base) MCG/ACT inhaler Inhale 2 puffs into the lungs every 6 (six) hours as needed for wheezing or shortness of breath.    allopurinol (ZYLOPRIM) 100 MG tablet Take 1 tablet (100 mg total) by mouth 2 (two) times daily.    AMBULATORY NON FORMULARY MEDICATION Motorized scooter.  Diagnosis: Osteoarthritis M19.90 06/05/2019: Reports her scooter and has some mechanical issues and a technician is scheduled to come and look at it this week.   atorvastatin (LIPITOR) 10 MG tablet TAKE ONE TABLET BY MOUTH EVERY MORNING    budesonide-formoterol (SYMBICORT) 160-4.5 MCG/ACT inhaler Inhale 2 puffs into the lungs 2 (two) times daily.    cephALEXin (KEFLEX) 500 MG capsule Take 1 capsule (500 mg total) by mouth 2 (two) times daily.    clopidogrel (PLAVIX) 75 MG tablet TAKE ONE TABLET BY MOUTH EVERY EVENING    Continuous Blood Gluc Receiver (FREESTYLE LIBRE 14 DAY READER) DEVI Use as directed to monitor BG.  Dx E11.65    Continuous Blood Gluc Sensor (FREESTYLE LIBRE 14 DAY SENSOR) MISC INJECT INTO THE SKIN EVERY 14 DAYS    doxycycline (VIBRA-TABS) 100 MG tablet Take 1 tablet (100 mg total) by mouth 2 (two) times daily.    ELIQUIS 5 MG TABS tablet Take 5 mg by mouth 2 (two) times daily.    fluticasone (FLONASE) 50 MCG/ACT nasal spray Place 2 sprays into both nostrils at bedtime. (Patient taking differently: Place 2 sprays into both nostrils in the morning and at bedtime. )    furosemide (LASIX) 20 MG tablet Take two tablets by mouth in the morning and one tablet by mouth at noon.    gabapentin (NEURONTIN) 300 MG capsule Take 300 mg by mouth 3 (three) times daily. (Patient not taking: Reported on  01/01/2020) 01/01/2020: Developed tremors   Insulin Aspart FlexPen 100 UNIT/ML SOPN Inject 0-10 Units into the skin daily as needed (If glucose if over 200). Sliding scale    insulin degludec (TRESIBA) 100 UNIT/ML FlexTouch Pen Inject 14 Units into the skin daily.  11/06/2019: Client state she takes 20 units daily   Insulin Pen Needle (PEN NEEDLES 31GX5/16") 31G X 8 MM MISC Use as needed 4 times/day.  Dx E11.65    levocetirizine (XYZAL) 5 MG tablet Take 1 tablet (5 mg total) by mouth every evening.    levothyroxine (SYNTHROID) 150 MCG tablet Take 150 mcg by mouth daily before breakfast.     lidocaine (XYLOCAINE) 4 % external solution Apply topically.    losartan (COZAAR) 25 MG tablet Take 1 tablet (25 mg total) by mouth daily.    Melatonin 10 MG TABS Take 20 mg by mouth at bedtime.     methocarbamol (ROBAXIN) 500 MG tablet 1 tab po bid    metoprolol succinate (TOPROL-XL) 25 MG 24 hr tablet TAKE ONE (1) TABLET BY MOUTH EVERY DAY    nystatin cream (MYCOSTATIN) Apply 1 application topically 2 (two) times daily.    ondansetron (ZOFRAN-ODT) 4 MG disintegrating tablet Take 4 mg by mouth every 8 (eight) hours as needed for nausea or vomiting.     ONETOUCH VERIO test strip 3 (three) times daily.    Oxycodone HCl 10 MG TABS 1 tab po  tid prn severe pain    Prenatal Vit-Fe Fumarate-FA (MULTIVITAMIN-PRENATAL) 27-0.8 MG TABS tablet Take 1 tablet by mouth daily.     Probiotic CAPS Take 2 capsules by mouth daily. 50 billion (gummy)    RABEprazole (ACIPHEX) 20 MG tablet Take 1 tablet (20 mg total) by mouth daily.    sertraline (ZOLOFT) 50 MG tablet Take 1 tablet (50 mg total) by mouth daily. (Patient not taking: No sig reported) 03/06/2020: Patient states made her sick. Stopped taking on 03/04/20   spironolactone (ALDACTONE) 100 MG tablet Take 1 tablet (100 mg total) by mouth daily.    tobramycin (TOBREX) 0.3 % ophthalmic solution Place 2 drops into both eyes every 6 (six) hours.    triamcinolone ointment (KENALOG) 0.1  % Apply to    No facility-administered encounter medications on file as of 05/16/2020.   Reviewed chart for medication changes ahead of medication coordination call.  No OVs, Consults, or hospital visits since last care coordination call.  No medication changes indicated.  BP Readings from Last 3 Encounters:  04/11/20 (!) 115/53  02/18/20 136/64  12/24/19 128/60    Lab Results  Component Value Date   HGBA1C 7.7 (H) 05/26/2019     Patient obtains medications through Adherence Packaging  90 Days   Last adherence delivery included: Losartan 25 mg; one tab with Breakfast Levocetirizine 5 mg tab' one tab at [employer]  Allopurinol 100 mg; one tab with Breakfast and one tab with Dinner Furosemide 20 mg: two tabs with Breakfast and two with Lunch  Atorvastatin 10 mg; one tab with Breakfast  Metoprolol Succinate 25 mg; one tab with Breakfast  Rabeprazole 20 mg; one tab with Breakfast  Levothyroxine 150 mcg; one tab before Breakfast Spironolactone 100 mg; one tab with Breakfast Clopidogrel 75 mg; one tab with Dinner Methocarbamol 500 mg; one tab with Breakfast, one with Dinner Oxycodone 5 mg Tresiba Pen Needles 31 g  Patient declined meds last month:on (09/16/57) One Touch Delica Lancets One Touch Test Strips (enough on hand)  Patient is not due for an adherence delivery at this time.  Called patient and reviewed medications and coordinated delivery.  This delivery to include: None, Patient is in a rehabilitation facility at this time from surgery in the hospital.   Patient does not need refills at this time.   Follow-Up:Pharmaciast Review  Charlann Lange, RMA Clinical Pharmacist Assistant [phone]  6 minutes spent in review, coordination, and documentation.  Reviewed by: Beverly Milch, PharmD Clinical Pharmacist [facility] [phone]

## 2020-05-19 ENCOUNTER — Other Ambulatory Visit: Payer: Self-pay

## 2020-05-19 ENCOUNTER — Other Ambulatory Visit: Payer: Self-pay | Admitting: Medical

## 2020-05-19 ENCOUNTER — Telehealth: Payer: Self-pay

## 2020-05-19 ENCOUNTER — Telehealth: Payer: HMO | Admitting: Medical

## 2020-05-19 NOTE — Telephone Encounter (Signed)
Rx oxycodone sent to pt pharmacy. She need sto make virtual visit or in office. Want to discuss possible hospice with her.

## 2020-05-19 NOTE — Telephone Encounter (Signed)
Pt needs video visit or in person visit. She had recent hospitalization. Pt home health MD called me before hospitalization wanting pt to enter hospice care.   So I wanted to talk with pt/see jhow doing and discuss hospice care??

## 2020-05-19 NOTE — Telephone Encounter (Signed)
Requesting: oxycodone 76m Contract: 08/01/2019 UDS: 04/11/2020 Last Visit: 04/11/2020 Next Visit: 05/20/2020 Last Refill: 04/21/2020 #90 and 0RF  Please Advise

## 2020-05-19 NOTE — Telephone Encounter (Signed)
Patient wants her oxycodone sent to [facility] ,

## 2020-05-20 ENCOUNTER — Other Ambulatory Visit: Payer: Self-pay

## 2020-05-20 ENCOUNTER — Telehealth (INDEPENDENT_AMBULATORY_CARE_PROVIDER_SITE_OTHER): Payer: HMO | Admitting: Medical

## 2020-05-20 DIAGNOSIS — I480 Paroxysmal atrial fibrillation: Secondary | ICD-10-CM

## 2020-05-20 DIAGNOSIS — L97509 Non-pressure chronic ulcer of other part of unspecified foot with unspecified severity: Secondary | ICD-10-CM

## 2020-05-20 DIAGNOSIS — N189 Chronic kidney disease, unspecified: Secondary | ICD-10-CM | POA: Diagnosis not present

## 2020-05-20 DIAGNOSIS — K7469 Other cirrhosis of liver: Secondary | ICD-10-CM

## 2020-05-20 DIAGNOSIS — G8929 Other chronic pain: Secondary | ICD-10-CM | POA: Diagnosis not present

## 2020-05-20 DIAGNOSIS — F32A Depression, unspecified: Secondary | ICD-10-CM | POA: Diagnosis not present

## 2020-05-20 DIAGNOSIS — I5022 Chronic systolic (congestive) heart failure: Secondary | ICD-10-CM | POA: Diagnosis not present

## 2020-05-20 DIAGNOSIS — Z794 Long term (current) use of insulin: Secondary | ICD-10-CM | POA: Diagnosis not present

## 2020-05-20 DIAGNOSIS — E11621 Type 2 diabetes mellitus with foot ulcer: Secondary | ICD-10-CM | POA: Diagnosis not present

## 2020-05-20 DIAGNOSIS — D500 Iron deficiency anemia secondary to blood loss (chronic): Secondary | ICD-10-CM

## 2020-05-20 IMAGING — DX PORTABLE CHEST - 1 VIEW
1 series · 1 of 1 positions shown · non-contrast
Comparison: Chest x-ray August 31, 2018, chest CT August 31, 2018

CLINICAL DATA: Shortness of breath for 3 days.

EXAM:
PORTABLE CHEST 1 VIEW

[chest ap]
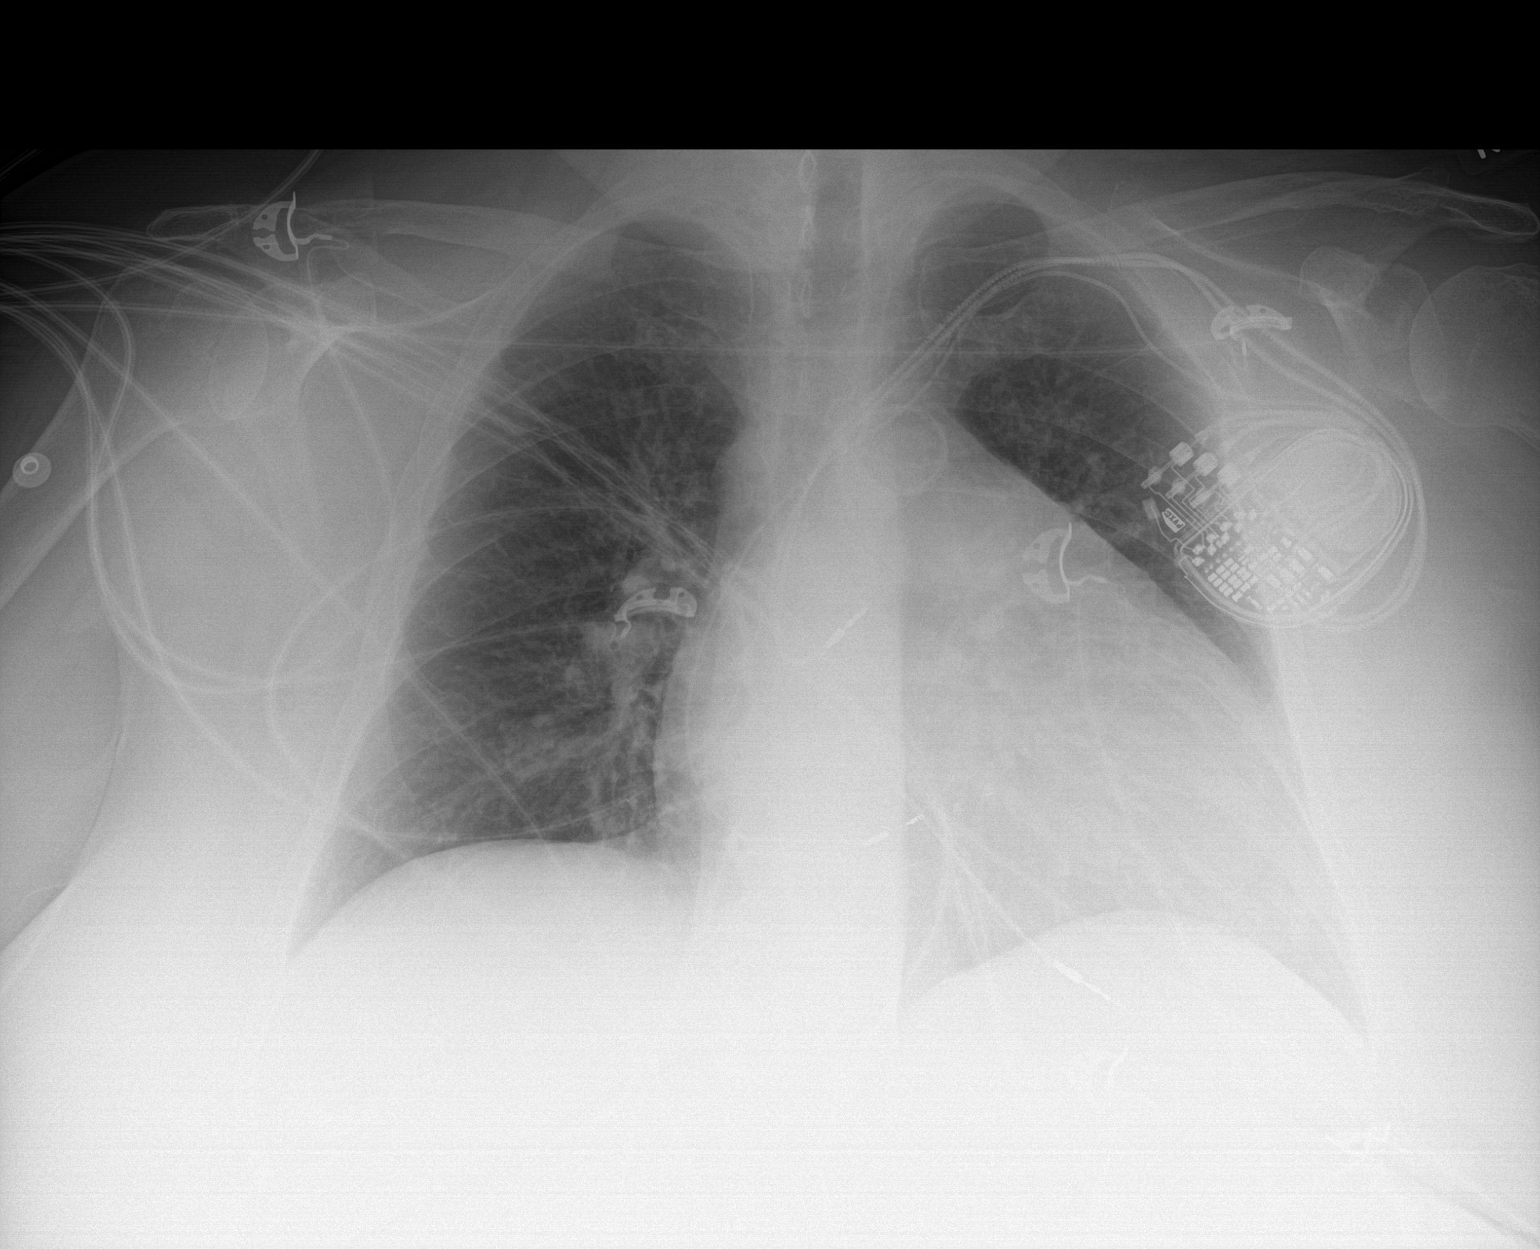

[1 of 1 positions shown; findings below may reference images not displayed]

FINDINGS: Cardiac pacemaker in stable position.

Cardiomediastinal silhouette is enlarged. Mediastinal contours
appear intact. Calcific atherosclerotic disease of the aorta.

There is no evidence of focal airspace consolidation, pleural
effusion or pneumothorax.

Osseous structures are without acute abnormality. Soft tissues are
grossly normal.
IMPRESSION: 1. Enlarged cardiac silhouette.
2. Calcific atherosclerotic disease of the aorta.

## 2020-05-20 NOTE — Telephone Encounter (Signed)
Patient still has VV 05/20/20

## 2020-05-20 NOTE — Telephone Encounter (Signed)
Patient has visit today 05/20/20

## 2020-05-20 NOTE — Patient Instructions (Addendum)
For depression will rx low dose effexor 37.5 mg daily.  For chronic pain lower extremity post amputation as well as recently during wound dressing changes refilled your oxycoone. Continue lyrica as well. Will ask staff to confirm oxy [facility] and then can send to local [facility].  For your multipe comorbidities, ckd, dialysis use, chf, atrial fibrillation, cirrhosis and anemia, I will go ahead and place referral to hospice. Counseled on hospice explained have been thinking about and home health doctor called and brought up as well. So explained reasoning. Pt is in agreement.  Follow up in about 2 weeks or as needed.

## 2020-05-20 NOTE — Progress Notes (Signed)
   Subjective:    Patient ID: Alexis [name], female    DOB: 01-20-1951, 70 y.o.   MRN: [id number]  HPI  Virtual Visit via Telephone Note  I connected with Alexis [name] on 05/20/20 at 10:40 AM EDT by telephone and verified that I am speaking with the correct person using two identifiers.  Location: Patient: home Provider: office.   I discussed the limitations, risks, security and privacy concerns of performing an evaluation and management service by telephone and the availability of in person appointments. I also discussed with the patient that there may be a patient responsible charge related to this service. The patient expressed understanding and agreed to proceed.   History of Present Illness:   Pt tells me she wants me to send in meds to [facility].Pt was told at [facility] it was not available. Pt states she told them to cancel the oxycodone.  Pt recently hospitalized. She had recent below the knee amputation.   Pt is diabetic, hx of cirrhosis, chf, PVD, HTN , ckd(dialysis) and chronic lower extremity leg pain.   Pt has been on lyrica in the past. Some muscle spasms in leg that had amputation.  Pt has some hx of depression. She states she had side effect with sertraline. Made her vomit.  Pt is getting physical therapy.   She anticipates will leave from rehab facility in 8 days.  Pt currently has home health coming out 20 hours a week.      Observations/Objective: General- no acute distress, alert and oriented. Sad overall over her amputation and over her general poor health.    Assessment and Plan: For depression will rx low dose effexor 37.5 mg daily.  For chronic pain lower extremity post amputation as well as recently during wound dressing changes refilled your oxycoone. Continue lyrica as well. Will ask staff to confirm [facility] and then [facility].  For your multipe comorbidities, ckd, dialysis use, chf,  atrial fibrillation, cirrhosis and anemia, I will go ahead and place referral to hospice. Counseled on hospice explained have been thinking about and home health doctor called and brought up as well. So explained reasoning. Pt is in agreement.  Follow up in about 2 weeks or as needed.  Time spent with patient today was45+   minutes which consisted of chart review, discussing diagnoses, tx, counseling and documentation.  Follow Up Instructions:    I discussed the assessment and treatment plan with the patient. The patient was provided an opportunity to ask questions and all were answered. The patient agreed with the plan and demonstrated an understanding of the instructions.   The patient was advised to call back or seek an in-person evaluation if the symptoms worsen or if the condition fails to improve as anticipated.     Mackie Pai, PA-C    Review of Systems     Objective:   Physical Exam        Assessment & Plan:

## 2020-05-21 ENCOUNTER — Telehealth: Payer: Self-pay | Admitting: Medical

## 2020-05-21 MED ORDER — VENLAFAXINE HCL ER 37.5 MG PO CP24: 37.5000 mg | ORAL_CAPSULE | Freq: Every day | ORAL | 3 refills | Status: AC

## 2020-05-21 MED ORDER — OXYCODONE HCL 10 MG PO TABS: ORAL_TABLET | ORAL | 0 refills | Status: DC

## 2020-05-21 NOTE — Telephone Encounter (Signed)
Spoke with [facility] and they stated patient wanted ALL her medications sent to [facility] and they cancelled her script for oxycodone  from 05/19/20 since its illegal to transfer controlled substances , please re-send in patients medications to [name]

## 2020-05-21 NOTE — Telephone Encounter (Signed)
Rx effexor and oxycodone sent to pt pharmacy.

## 2020-05-21 NOTE — Telephone Encounter (Signed)
Medication unable to be sent to a different pharmacy due to being sent to a mail order on 3/21

## 2020-05-21 NOTE — Telephone Encounter (Signed)
Patient states she wants rx to go to  [facility], [place] [address] Phone:  [phone]  Fax:  [phone]

## 2020-05-22 ENCOUNTER — Telehealth: Payer: Self-pay | Admitting: Medical

## 2020-05-22 ENCOUNTER — Telehealth: Payer: Self-pay

## 2020-05-22 NOTE — Telephone Encounter (Signed)
Patient called, patient is waiting on referral to hospice care.   Please advise

## 2020-05-22 NOTE — Telephone Encounter (Signed)
Okay for referral?

## 2020-05-22 NOTE — Telephone Encounter (Signed)
Initial Comment Caller states she has been waiting on Jarrett Soho to call her back about her oxycodone and hasn't heard back, she called the pharmacy and they have not received it. Caller states the Dr was suppose to have it transferred to another pharmacy and she needs the medication. Caller states the pharmacy she needs it sent to closes at 6:30, she is having pain. Translation No No Triage Reason Other Nurse Assessment Nurse: Genelle Gather, RN, Magda Paganini Date/Time (Eastern Time): 05/21/2020 6:22:55 PM Confirm and document reason for call. If symptomatic, describe symptoms. ---Caller states she was calling regarding rx. She states she just got a text that the med is at pharmacy. Does the patient have any new or worsening symptoms? ---Yes Will a triage be completed? ---No Select reason for no triage. ---Other Disp. Time Eilene Ghazi Time) Disposition Final User 05/21/2020 6:24:15 PM Clinical Call Yes Genelle Gather, RN, Magda Paganini

## 2020-05-23 ENCOUNTER — Encounter: Payer: Self-pay | Admitting: Medical

## 2020-05-23 NOTE — Addendum Note (Signed)
Addended by: Anabel Halon on: 05/23/2020 01:17 PM   Modules accepted: Orders

## 2020-05-23 NOTE — Telephone Encounter (Signed)
Called pt and she stated she did pick up her medication .

## 2020-05-23 NOTE — Telephone Encounter (Signed)
Referral to hospice placed.

## 2020-05-26 ENCOUNTER — Telehealth: Payer: Self-pay | Admitting: *Deleted

## 2020-05-26 NOTE — Telephone Encounter (Signed)
Physician Saguier, Percell Miller - PA Contact Type Call Who Is Calling Patient / Member / Family / Caregiver Call Type Triage / Clinical Relationship To Patient Self Return Phone Number [phone] (Primary) Chief Complaint Pain - Generalized Reason for Call Symptomatic / Request for Health Information Initial Comment Caller had her leg amputated and states the muscle relaxer helped with the pain, but the doctor at [facility] will not give her anymore. Caller wants the medication called in. Translation No Nurse Assessment Nurse: Lynnette Caffey, RN, Museum/gallery conservator Date/Time (Eastern Time): 05/24/2020 11:00:48 PM Confirm and document reason for call. If symptomatic, describe symptoms. ---Caller had her right leg amputated from knee down on 05/07/20. States the muscle relaxer helped with the pain, but the doctor at [facility] took her off of it. Caller wants the medication called in. States having muscle spasms. States not getting meds on time.

## 2020-05-26 NOTE — Telephone Encounter (Signed)
Pt talking with provider via [employer]

## 2020-05-28 ENCOUNTER — Other Ambulatory Visit: Payer: Self-pay

## 2020-05-28 ENCOUNTER — Inpatient Hospital Stay (HOSPITAL_COMMUNITY)
Admission: EM | Admit: 2020-05-28 | Discharge: 2020-06-03 | DRG: 564 | Disposition: A | Discharge status: Hospice | Payer: HMO | Attending: Internal Medicine | Admitting: Internal Medicine

## 2020-05-28 ENCOUNTER — Encounter (HOSPITAL_COMMUNITY): Payer: Self-pay

## 2020-05-28 ENCOUNTER — Emergency Department (HOSPITAL_COMMUNITY): Payer: HMO

## 2020-05-28 DIAGNOSIS — D631 Anemia in chronic kidney disease: Secondary | ICD-10-CM | POA: Diagnosis present

## 2020-05-28 DIAGNOSIS — Z79890 Hormone replacement therapy (postmenopausal): Secondary | ICD-10-CM

## 2020-05-28 DIAGNOSIS — Z515 Encounter for palliative care: Secondary | ICD-10-CM | POA: Diagnosis not present

## 2020-05-28 DIAGNOSIS — Z7901 Long term (current) use of anticoagulants: Secondary | ICD-10-CM

## 2020-05-28 DIAGNOSIS — Z89511 Acquired absence of right leg below knee: Secondary | ICD-10-CM

## 2020-05-28 DIAGNOSIS — Z66 Do not resuscitate: Secondary | ICD-10-CM | POA: Diagnosis not present

## 2020-05-28 DIAGNOSIS — R062 Wheezing: Secondary | ICD-10-CM | POA: Diagnosis not present

## 2020-05-28 DIAGNOSIS — F419 Anxiety disorder, unspecified: Secondary | ICD-10-CM | POA: Diagnosis present

## 2020-05-28 DIAGNOSIS — Z882 Allergy status to sulfonamides status: Secondary | ICD-10-CM

## 2020-05-28 DIAGNOSIS — E8770 Fluid overload, unspecified: Secondary | ICD-10-CM | POA: Diagnosis present

## 2020-05-28 DIAGNOSIS — E1122 Type 2 diabetes mellitus with diabetic chronic kidney disease: Secondary | ICD-10-CM | POA: Diagnosis not present

## 2020-05-28 DIAGNOSIS — R5381 Other malaise: Secondary | ICD-10-CM

## 2020-05-28 DIAGNOSIS — Z8249 Family history of ischemic heart disease and other diseases of the circulatory system: Secondary | ICD-10-CM

## 2020-05-28 DIAGNOSIS — T148XXA Other injury of unspecified body region, initial encounter: Secondary | ICD-10-CM

## 2020-05-28 DIAGNOSIS — F32A Depression, unspecified: Secondary | ICD-10-CM | POA: Diagnosis present

## 2020-05-28 DIAGNOSIS — E1152 Type 2 diabetes mellitus with diabetic peripheral angiopathy with gangrene: Secondary | ICD-10-CM | POA: Diagnosis present

## 2020-05-28 DIAGNOSIS — E785 Hyperlipidemia, unspecified: Secondary | ICD-10-CM | POA: Diagnosis present

## 2020-05-28 DIAGNOSIS — I5022 Chronic systolic (congestive) heart failure: Secondary | ICD-10-CM | POA: Diagnosis present

## 2020-05-28 DIAGNOSIS — J810 Acute pulmonary edema: Secondary | ICD-10-CM | POA: Diagnosis not present

## 2020-05-28 DIAGNOSIS — I5032 Chronic diastolic (congestive) heart failure: Secondary | ICD-10-CM | POA: Diagnosis not present

## 2020-05-28 DIAGNOSIS — Z20822 Contact with and (suspected) exposure to covid-19: Secondary | ICD-10-CM | POA: Diagnosis not present

## 2020-05-28 DIAGNOSIS — Z950 Presence of cardiac pacemaker: Secondary | ICD-10-CM | POA: Diagnosis not present

## 2020-05-28 DIAGNOSIS — T8743 Infection of amputation stump, right lower extremity: Secondary | ICD-10-CM | POA: Diagnosis present

## 2020-05-28 DIAGNOSIS — J9601 Acute respiratory failure with hypoxia: Secondary | ICD-10-CM | POA: Insufficient documentation

## 2020-05-28 DIAGNOSIS — M62838 Other muscle spasm: Secondary | ICD-10-CM | POA: Diagnosis present

## 2020-05-28 DIAGNOSIS — I517 Cardiomegaly: Secondary | ICD-10-CM | POA: Diagnosis not present

## 2020-05-28 DIAGNOSIS — R0989 Other specified symptoms and signs involving the circulatory and respiratory systems: Secondary | ICD-10-CM

## 2020-05-28 DIAGNOSIS — G546 Phantom limb syndrome with pain: Secondary | ICD-10-CM | POA: Diagnosis present

## 2020-05-28 DIAGNOSIS — R0902 Hypoxemia: Secondary | ICD-10-CM | POA: Diagnosis not present

## 2020-05-28 DIAGNOSIS — I1311 Hypertensive heart and chronic kidney disease without heart failure, with stage 5 chronic kidney disease, or end stage renal disease: Secondary | ICD-10-CM | POA: Diagnosis not present

## 2020-05-28 DIAGNOSIS — I132 Hypertensive heart and chronic kidney disease with heart failure and with stage 5 chronic kidney disease, or end stage renal disease: Secondary | ICD-10-CM | POA: Diagnosis present

## 2020-05-28 DIAGNOSIS — Y835 Amputation of limb(s) as the cause of abnormal reaction of the patient, or of later complication, without mention of misadventure at the time of the procedure: Secondary | ICD-10-CM | POA: Diagnosis present

## 2020-05-28 DIAGNOSIS — J811 Chronic pulmonary edema: Secondary | ICD-10-CM | POA: Insufficient documentation

## 2020-05-28 DIAGNOSIS — I959 Hypotension, unspecified: Secondary | ICD-10-CM | POA: Diagnosis not present

## 2020-05-28 DIAGNOSIS — Z833 Family history of diabetes mellitus: Secondary | ICD-10-CM

## 2020-05-28 DIAGNOSIS — Z794 Long term (current) use of insulin: Secondary | ICD-10-CM

## 2020-05-28 DIAGNOSIS — Z7951 Long term (current) use of inhaled steroids: Secondary | ICD-10-CM

## 2020-05-28 DIAGNOSIS — L089 Local infection of the skin and subcutaneous tissue, unspecified: Secondary | ICD-10-CM | POA: Diagnosis not present

## 2020-05-28 DIAGNOSIS — E875 Hyperkalemia: Secondary | ICD-10-CM | POA: Diagnosis present

## 2020-05-28 DIAGNOSIS — E039 Hypothyroidism, unspecified: Secondary | ICD-10-CM | POA: Diagnosis present

## 2020-05-28 DIAGNOSIS — J449 Chronic obstructive pulmonary disease, unspecified: Secondary | ICD-10-CM | POA: Diagnosis not present

## 2020-05-28 DIAGNOSIS — E1142 Type 2 diabetes mellitus with diabetic polyneuropathy: Secondary | ICD-10-CM | POA: Diagnosis present

## 2020-05-28 DIAGNOSIS — Z888 Allergy status to other drugs, medicaments and biological substances status: Secondary | ICD-10-CM

## 2020-05-28 DIAGNOSIS — I10 Essential (primary) hypertension: Secondary | ICD-10-CM | POA: Diagnosis present

## 2020-05-28 DIAGNOSIS — N186 End stage renal disease: Secondary | ICD-10-CM | POA: Diagnosis present

## 2020-05-28 DIAGNOSIS — R0600 Dyspnea, unspecified: Secondary | ICD-10-CM | POA: Diagnosis not present

## 2020-05-28 DIAGNOSIS — T8781 Dehiscence of amputation stump: Principal | ICD-10-CM | POA: Diagnosis present

## 2020-05-28 DIAGNOSIS — Z992 Dependence on renal dialysis: Secondary | ICD-10-CM

## 2020-05-28 DIAGNOSIS — I252 Old myocardial infarction: Secondary | ICD-10-CM

## 2020-05-28 DIAGNOSIS — E669 Obesity, unspecified: Secondary | ICD-10-CM | POA: Diagnosis present

## 2020-05-28 DIAGNOSIS — R269 Unspecified abnormalities of gait and mobility: Secondary | ICD-10-CM | POA: Diagnosis present

## 2020-05-28 DIAGNOSIS — I739 Peripheral vascular disease, unspecified: Secondary | ICD-10-CM | POA: Diagnosis present

## 2020-05-28 DIAGNOSIS — Z881 Allergy status to other antibiotic agents status: Secondary | ICD-10-CM

## 2020-05-28 DIAGNOSIS — K7469 Other cirrhosis of liver: Secondary | ICD-10-CM | POA: Diagnosis present

## 2020-05-28 DIAGNOSIS — R0602 Shortness of breath: Secondary | ICD-10-CM | POA: Diagnosis not present

## 2020-05-28 DIAGNOSIS — Z87891 Personal history of nicotine dependence: Secondary | ICD-10-CM

## 2020-05-28 DIAGNOSIS — R531 Weakness: Secondary | ICD-10-CM | POA: Diagnosis not present

## 2020-05-28 DIAGNOSIS — Z885 Allergy status to narcotic agent status: Secondary | ICD-10-CM

## 2020-05-28 DIAGNOSIS — T8741 Infection of amputation stump, right upper extremity: Secondary | ICD-10-CM | POA: Diagnosis present

## 2020-05-28 DIAGNOSIS — E8779 Other fluid overload: Secondary | ICD-10-CM | POA: Diagnosis not present

## 2020-05-28 DIAGNOSIS — Z83438 Family history of other disorder of lipoprotein metabolism and other lipidemia: Secondary | ICD-10-CM

## 2020-05-28 DIAGNOSIS — Z7902 Long term (current) use of antithrombotics/antiplatelets: Secondary | ICD-10-CM

## 2020-05-28 DIAGNOSIS — M2550 Pain in unspecified joint: Secondary | ICD-10-CM | POA: Diagnosis not present

## 2020-05-28 DIAGNOSIS — I2510 Atherosclerotic heart disease of native coronary artery without angina pectoris: Secondary | ICD-10-CM

## 2020-05-28 DIAGNOSIS — Z7401 Bed confinement status: Secondary | ICD-10-CM | POA: Diagnosis not present

## 2020-05-28 DIAGNOSIS — Z79899 Other long term (current) drug therapy: Secondary | ICD-10-CM

## 2020-05-28 DIAGNOSIS — I4821 Permanent atrial fibrillation: Secondary | ICD-10-CM | POA: Diagnosis present

## 2020-05-28 HISTORY — DX: End stage renal disease: N18.6

## 2020-05-28 HISTORY — DX: Dependence on renal dialysis: Z99.2

## 2020-05-28 LAB — HEMOGLOBIN A1C
Hgb A1c MFr Bld: 5.7 % — ABNORMAL HIGH (ref 4.8–5.6)
Mean Plasma Glucose: 116.89 mg/dL

## 2020-05-28 LAB — COMPREHENSIVE METABOLIC PANEL
ALT: 14 U/L (ref 0–44)
AST: 57 U/L — ABNORMAL HIGH (ref 15–41)
Albumin: 2.1 g/dL — ABNORMAL LOW (ref 3.5–5.0)
Alkaline Phosphatase: 208 U/L — ABNORMAL HIGH (ref 38–126)
Anion gap: 9 (ref 5–15)
BUN: 34 mg/dL — ABNORMAL HIGH (ref 8–23)
CO2: 25 mmol/L (ref 22–32)
Calcium: 8.4 mg/dL — ABNORMAL LOW (ref 8.9–10.3)
Chloride: 93 mmol/L — ABNORMAL LOW (ref 98–111)
Creatinine, Ser: 1.96 mg/dL — ABNORMAL HIGH (ref 0.44–1.00)
GFR, Estimated: 27 mL/min — ABNORMAL LOW (ref >60)
Glucose, Bld: 124 mg/dL — ABNORMAL HIGH (ref 70–99)
Potassium: 5.3 mmol/L — ABNORMAL HIGH (ref 3.5–5.1)
Sodium: 127 mmol/L — ABNORMAL LOW (ref 135–145)
Total Bilirubin: 1.6 mg/dL — ABNORMAL HIGH (ref 0.3–1.2)
Total Protein: 6.3 g/dL — ABNORMAL LOW (ref 6.5–8.1)

## 2020-05-28 LAB — CBC WITH DIFFERENTIAL/PLATELET
Abs Immature Granulocytes: 0.03 10*3/uL (ref 0.00–0.07)
Basophils Absolute: 0 10*3/uL (ref 0.0–0.1)
Basophils Relative: 1 %
Eosinophils Absolute: 0.2 10*3/uL (ref 0.0–0.5)
Eosinophils Relative: 4 %
HCT: 27.3 % — ABNORMAL LOW (ref 36.0–46.0)
Hemoglobin: 8.5 g/dL — ABNORMAL LOW (ref 12.0–15.0)
Immature Granulocytes: 1 %
Lymphocytes Relative: 20 %
Lymphs Abs: 1 10*3/uL (ref 0.7–4.0)
MCH: 29.6 pg (ref 26.0–34.0)
MCHC: 31.1 g/dL (ref 30.0–36.0)
MCV: 95.1 fL (ref 80.0–100.0)
Monocytes Absolute: 0.4 10*3/uL (ref 0.1–1.0)
Monocytes Relative: 8 %
Neutro Abs: 3.3 10*3/uL (ref 1.7–7.7)
Neutrophils Relative %: 66 %
Platelets: UNDETERMINED 10*3/uL (ref 150–400)
RBC: 2.87 MIL/uL — ABNORMAL LOW (ref 3.87–5.11)
RDW: 22.1 % — ABNORMAL HIGH (ref 11.5–15.5)
WBC: 5 10*3/uL (ref 4.0–10.5)
nRBC: 0 % (ref 0.0–0.2)

## 2020-05-28 LAB — GLUCOSE, CAPILLARY: Glucose-Capillary: 94 mg/dL (ref 70–99)

## 2020-05-28 LAB — I-STAT CHEM 8, ED
BUN: 44 mg/dL — ABNORMAL HIGH (ref 8–23)
Calcium, Ion: 0.62 mmol/L — CL (ref 1.15–1.40)
Chloride: 100 mmol/L (ref 98–111)
Creatinine, Ser: 1.9 mg/dL — ABNORMAL HIGH (ref 0.44–1.00)
Glucose, Bld: 116 mg/dL — ABNORMAL HIGH (ref 70–99)
HCT: 28 % — ABNORMAL LOW (ref 36.0–46.0)
Hemoglobin: 9.5 g/dL — ABNORMAL LOW (ref 12.0–15.0)
Potassium: 5.7 mmol/L — ABNORMAL HIGH (ref 3.5–5.1)
Sodium: 122 mmol/L — ABNORMAL LOW (ref 135–145)
TCO2: 26 mmol/L (ref 22–32)

## 2020-05-28 LAB — RESP PANEL BY RT-PCR (FLU A&B, COVID) ARPGX2
Influenza A by PCR: NEGATIVE
Influenza B by PCR: NEGATIVE
SARS Coronavirus 2 by RT PCR: NEGATIVE

## 2020-05-28 LAB — MRSA PCR SCREENING: MRSA by PCR: NEGATIVE

## 2020-05-28 LAB — PHOSPHORUS: Phosphorus: 3.5 mg/dL (ref 2.5–4.6)

## 2020-05-28 LAB — MAGNESIUM: Magnesium: 2 mg/dL (ref 1.7–2.4)

## 2020-05-28 LAB — HEPATITIS B SURFACE ANTIGEN: Hepatitis B Surface Ag: NONREACTIVE

## 2020-05-28 MED ORDER — CHLORHEXIDINE GLUCONATE CLOTH 2 % EX PADS
6.0000 | MEDICATED_PAD | Freq: Every day | CUTANEOUS | Status: DC
  Administered 2020-05-29 – 2020-06-03 (×6): 6 via TOPICAL

## 2020-05-28 MED ORDER — CLOPIDOGREL BISULFATE 75 MG PO TABS
75.0000 mg | ORAL_TABLET | Freq: Every day | ORAL | Status: DC
  Administered 2020-05-29 – 2020-06-03 (×6): 75 mg via ORAL
  Filled 2020-05-28 (×7): qty 1

## 2020-05-28 MED ORDER — IPRATROPIUM-ALBUTEROL 0.5-2.5 (3) MG/3ML IN SOLN: 3.0000 mL | Freq: Once | RESPIRATORY_TRACT | Status: DC

## 2020-05-28 MED ORDER — METOPROLOL SUCCINATE ER 25 MG PO TB24
12.5000 mg | ORAL_TABLET | Freq: Every day | ORAL | Status: DC
  Administered 2020-05-29 – 2020-05-30 (×2): 12.5 mg via ORAL
  Filled 2020-05-28 (×6): qty 1

## 2020-05-28 MED ORDER — LORATADINE 10 MG PO TABS
5.0000 mg | ORAL_TABLET | Freq: Every evening | ORAL | Status: DC
  Administered 2020-05-29 – 2020-06-02 (×6): 5 mg via ORAL
  Filled 2020-05-28 (×6): qty 1

## 2020-05-28 MED ORDER — ACETAMINOPHEN 650 MG RE SUPP: 650.0000 mg | Freq: Four times a day (QID) | RECTAL | Status: DC | PRN

## 2020-05-28 MED ORDER — MOMETASONE FURO-FORMOTEROL FUM 200-5 MCG/ACT IN AERO
2.0000 | INHALATION_SPRAY | Freq: Two times a day (BID) | RESPIRATORY_TRACT | Status: DC
  Administered 2020-05-29 – 2020-06-03 (×11): 2 via RESPIRATORY_TRACT
  Filled 2020-05-28: qty 8.8

## 2020-05-28 MED ORDER — ALLOPURINOL 100 MG PO TABS
100.0000 mg | ORAL_TABLET | Freq: Two times a day (BID) | ORAL | Status: DC
  Administered 2020-05-29 – 2020-06-03 (×12): 100 mg via ORAL
  Filled 2020-05-28 (×12): qty 1

## 2020-05-28 MED ORDER — TOBRAMYCIN 0.3 % OP SOLN
2.0000 [drp] | Freq: Four times a day (QID) | OPHTHALMIC | Status: DC
  Administered 2020-05-29 – 2020-06-03 (×20): 2 [drp] via OPHTHALMIC
  Filled 2020-05-28: qty 5

## 2020-05-28 MED ORDER — PRENATAL MULTIVITAMIN CH
1.0000 | ORAL_TABLET | Freq: Every day | ORAL | Status: DC
  Administered 2020-05-29 – 2020-06-03 (×6): 1 via ORAL
  Filled 2020-05-28 (×6): qty 1

## 2020-05-28 MED ORDER — LEVOTHYROXINE SODIUM 25 MCG PO TABS
12.5000 ug | ORAL_TABLET | Freq: Every day | ORAL | Status: DC
  Administered 2020-05-29 – 2020-06-03 (×6): 12.5 ug via ORAL
  Filled 2020-05-28 (×6): qty 1

## 2020-05-28 MED ORDER — SODIUM CHLORIDE 0.9% FLUSH: 3.0000 mL | INTRAVENOUS | Status: DC | PRN

## 2020-05-28 MED ORDER — ALBUTEROL SULFATE HFA 108 (90 BASE) MCG/ACT IN AERS
2.0000 | INHALATION_SPRAY | Freq: Four times a day (QID) | RESPIRATORY_TRACT | Status: DC | PRN
  Filled 2020-05-28: qty 6.7

## 2020-05-28 MED ORDER — SODIUM CHLORIDE 0.9% FLUSH
3.0000 mL | Freq: Two times a day (BID) | INTRAVENOUS | Status: DC
  Administered 2020-05-28 – 2020-06-02 (×10): 3 mL via INTRAVENOUS

## 2020-05-28 MED ORDER — SODIUM CHLORIDE 0.9 % IV SOLN: 250.0000 mL | INTRAVENOUS | Status: DC | PRN

## 2020-05-28 MED ORDER — LEVOTHYROXINE SODIUM 75 MCG PO TABS
150.0000 ug | ORAL_TABLET | Freq: Every day | ORAL | Status: DC
  Administered 2020-05-29 – 2020-06-03 (×6): 150 ug via ORAL
  Filled 2020-05-28 (×6): qty 2

## 2020-05-28 MED ORDER — SODIUM ZIRCONIUM CYCLOSILICATE 10 G PO PACK: 10.0000 g | PACK | Freq: Once | ORAL | Status: DC

## 2020-05-28 MED ORDER — VENLAFAXINE HCL ER 37.5 MG PO CP24
37.5000 mg | ORAL_CAPSULE | Freq: Every day | ORAL | Status: DC
  Administered 2020-05-29 – 2020-06-03 (×6): 37.5 mg via ORAL
  Filled 2020-05-28 (×6): qty 1

## 2020-05-28 MED ORDER — INSULIN GLARGINE 100 UNIT/ML ~~LOC~~ SOLN
20.0000 [IU] | Freq: Every day | SUBCUTANEOUS | Status: DC
  Administered 2020-05-29: 20 [IU] via SUBCUTANEOUS
  Filled 2020-05-28 (×2): qty 0.2

## 2020-05-28 MED ORDER — ATORVASTATIN CALCIUM 10 MG PO TABS
10.0000 mg | ORAL_TABLET | Freq: Every evening | ORAL | Status: DC
  Administered 2020-05-29 – 2020-06-02 (×6): 10 mg via ORAL
  Filled 2020-05-28 (×6): qty 1

## 2020-05-28 MED ORDER — IPRATROPIUM-ALBUTEROL 0.5-2.5 (3) MG/3ML IN SOLN
3.0000 mL | Freq: Once | RESPIRATORY_TRACT | Status: AC
  Administered 2020-05-28: 3 mL via RESPIRATORY_TRACT
  Filled 2020-05-28: qty 3

## 2020-05-28 MED ORDER — OXYCODONE HCL 5 MG PO TABS
5.0000 mg | ORAL_TABLET | Freq: Four times a day (QID) | ORAL | Status: DC | PRN
  Administered 2020-05-28: 10 mg via ORAL
  Administered 2020-05-29 (×2): 5 mg via ORAL
  Filled 2020-05-28 (×3): qty 2

## 2020-05-28 MED ORDER — MIDODRINE HCL 5 MG PO TABS
5.0000 mg | ORAL_TABLET | Freq: Three times a day (TID) | ORAL | Status: DC
  Administered 2020-05-29 – 2020-06-03 (×16): 5 mg via ORAL
  Filled 2020-05-28 (×15): qty 1

## 2020-05-28 MED ORDER — INSULIN ASPART 100 UNIT/ML ~~LOC~~ SOLN
0.0000 [IU] | Freq: Three times a day (TID) | SUBCUTANEOUS | Status: DC
  Administered 2020-05-29 – 2020-06-01 (×4): 1 [IU] via SUBCUTANEOUS
  Administered 2020-06-01 (×2): 2 [IU] via SUBCUTANEOUS

## 2020-05-28 MED ORDER — POLYETHYLENE GLYCOL 3350 17 G PO PACK: 17.0000 g | PACK | Freq: Every day | ORAL | Status: DC | PRN

## 2020-05-28 MED ORDER — INSULIN ASPART 100 UNIT/ML ~~LOC~~ SOLN: 0.0000 [IU] | Freq: Every day | SUBCUTANEOUS | Status: DC

## 2020-05-28 MED ORDER — DOXYCYCLINE HYCLATE 100 MG PO TABS
100.0000 mg | ORAL_TABLET | Freq: Two times a day (BID) | ORAL | Status: DC
  Administered 2020-05-29 – 2020-06-03 (×12): 100 mg via ORAL
  Filled 2020-05-28 (×12): qty 1

## 2020-05-28 MED ORDER — PANTOPRAZOLE SODIUM 40 MG PO TBEC
40.0000 mg | DELAYED_RELEASE_TABLET | Freq: Every day | ORAL | Status: DC
  Administered 2020-05-29 – 2020-06-02 (×5): 40 mg via ORAL
  Filled 2020-05-28 (×7): qty 1

## 2020-05-28 MED ORDER — ACETAMINOPHEN 325 MG PO TABS
650.0000 mg | ORAL_TABLET | Freq: Four times a day (QID) | ORAL | Status: DC | PRN
  Administered 2020-05-29: 650 mg via ORAL
  Filled 2020-05-28 (×2): qty 2

## 2020-05-28 NOTE — ED Notes (Signed)
Called RT

## 2020-05-28 NOTE — ED Notes (Signed)
IV team at bedside 

## 2020-05-28 NOTE — ED Provider Notes (Signed)
Emergency Department Provider Note   I have reviewed the triage vital signs and the nursing notes.   HISTORY  Chief Complaint Shortness of Breath and Weakness   HPI Alexis [name] is a 70 y.o. female with past medical history of CHF, COPD, end-stage renal disease, presents to the emergency department with shortness of breath in the setting of missing dialysis.  She last went on Monday with a full treatment.  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  She states this is limited her mobility and her husband could not get her into the wheelchair today due to pain but also shortness of breath.  She denies any chest pain.  She has felt more drowsy than normal.  She denies any fevers, chills, vomiting, diarrhea.    Past Medical History:  Diagnosis Date  . Allergy   . Anxiety   . Arthritis   . Asthma   . Atrial fibrillation ([place])   . CHF (congestive heart failure) ([place])   . Cirrhosis ([place])   . COPD (chronic obstructive pulmonary disease) ([place])   . Depression 05/26/2019  . Diabetes mellitus without complication ([place])   . Hyperlipidemia   . Hypertension   . Macrocytic anemia 09/04/2018  . Myocardial infarction ([place])    several  . Pacemaker    CRT-P with RV lead and His Bundle lead ( high threshold)   . Peripheral neuropathy   . Pneumonia   . PONV (postoperative nausea and vomiting)    unsure of exactly what happened at [facility] in 2010 (knee surgery) that led to crital care  . Thyroid disease     Patient Active Problem List   Diagnosis Date Noted  . Depression 05/26/2019  . Cellulitis 05/26/2019  . Other cirrhosis of liver ([place]) 05/26/2019  . Chronic systolic CHF (congestive heart failure) ([place]) 05/26/2019  . Cellulitis in diabetic foot ([place]) 05/25/2019  . Epigastric pain   . Idiopathic esophageal varices without bleeding ([place])   . Anemia due to chronic blood loss   . Paroxysmal  atrial fibrillation ([place]) 09/26/2018  . Macrocytic anemia 09/04/2018  . Pacemaker 08/25/2018  . Insulin dependent diabetes mellitus with complications 38/18/2993  . Constipation 08/25/2018  . Fluid overload 08/25/2018  . Chronic anticoagulation 08/25/2018  . Morbid obesity ([place]) 08/25/2018  . Chronic diastolic heart failure ([place]) 05/17/2018  . Acute bronchitis with COPD ([place]) 01/16/2018  . Acquired absence of other right toe(s) ([place]) 11/16/2017  . Type 2 diabetes mellitus with foot ulcer ([place]) 11/16/2017  . Crush injury of left foot, initial encounter 11/28/2016  . Osteomyelitis ([place]) 07/30/2016  . Gangrene of foot ([place]) 04/27/2016  . Thrombocytopenia ([place])   . Vomiting and diarrhea 04/05/2016  . Hyperlipidemia 11/18/2015  . Gout 08/05/2014  . Cough 08/05/2014  . Allergic rhinitis 07/12/2014  . Osteoarthritis 07/12/2014  . Asthma 07/12/2014  . Congestive heart failure ([place]) 07/12/2014  . Permanent atrial fibrillation ([place]) 07/12/2014  . HTN (hypertension) 07/12/2014  . Hypothyroidism 07/12/2014  . History of substance abuse ([place]) 07/12/2014  . Peripheral vascular disease ([place]) 04/02/2013  . Discoloration of skin 04/02/2013    Past Surgical History:  Procedure Laterality Date  . ABDOMINAL AORTOGRAM W/LOWER EXTREMITY N/A 04/29/2016   Procedure: Abdominal Aortogram w/Lower Extremity;  Surgeon: Waynetta Sandy, MD;  Location: [facility];  Service: Cardiovascular;  Laterality: N/A;  . ABDOMINAL AORTOGRAM W/LOWER EXTREMITY N/A 05/06/2017  Procedure: ABDOMINAL AORTOGRAM W/LOWER EXTREMITY;  Surgeon: Angelia Mould, MD;  Location: [facility];  Service: Cardiovascular;  Laterality: N/A;  bilateral  . ABDOMINAL AORTOGRAM W/LOWER EXTREMITY Bilateral 05/04/2019   Procedure: ABDOMINAL AORTOGRAM W/LOWER EXTREMITY;  Surgeon: Angelia Mould, MD;  Location: [facility];  Service: Cardiovascular;  Laterality: Bilateral;  . ABDOMINAL AORTOGRAM W/LOWER  EXTREMITY Left 08/27/2019   Procedure: ABDOMINAL AORTOGRAM W/LOWER EXTREMITY;  Surgeon: Waynetta Sandy, MD;  Location: [facility];  Service: Cardiovascular;  Laterality: Left;  . ABDOMINAL HYSTERECTOMY    . AMPUTATION Right 07/30/2016   Procedure: AMPUTATION RIGHT SECOND TOE;  Surgeon: Angelia Mould, MD;  Location: [facility];  Service: Vascular;  Laterality: Right;  . CARDIAC CATHETERIZATION     2005 at [place] N/A 12/25/2018   Procedure: COLONOSCOPY WITH PROPOFOL;  Surgeon: Jackquline Denmark, MD;  Location: [facility];  Service: Endoscopy;  Laterality: N/A;  . ESOPHAGEAL BANDING  12/25/2018   Procedure: ESOPHAGEAL BANDING;  Surgeon: Jackquline Denmark, MD;  [facility];  Service: Endoscopy;;  . ESOPHAGEAL BANDING  05/01/2019   Procedure: ESOPHAGEAL BANDING;  Surgeon: Jackquline Denmark, MD;  [facility];  Service: Endoscopy;;  . ESOPHAGOGASTRODUODENOSCOPY (EGD) WITH PROPOFOL N/A 12/25/2018   Procedure: ESOPHAGOGASTRODUODENOSCOPY (EGD) WITH PROPOFOL;  Surgeon: Jackquline Denmark, MD;  [facility];  Service: Endoscopy;  Laterality: N/A;  . ESOPHAGOGASTRODUODENOSCOPY (EGD) WITH PROPOFOL N/A 05/01/2019   Procedure: ESOPHAGOGASTRODUODENOSCOPY (EGD) WITH PROPOFOL;  Surgeon: Jackquline Denmark, MD;  [facility];  Service: Endoscopy;  Laterality: N/A;  . LOWER EXTREMITY ANGIOGRAPHY Right 05/14/2019   Procedure: LOWER EXTREMITY ANGIOGRAPHY;  Surgeon: Waynetta Sandy, MD;  Location: [facility];  Service: Cardiovascular;  Laterality: Right;  . PERIPHERAL VASCULAR ATHERECTOMY Right 04/29/2016   Procedure: Peripheral Vascular Atherectomy-Right Popliteal;  Surgeon: Waynetta Sandy, MD;  Location: [facility];  Service: Cardiovascular;  Laterality: Right;  . PERIPHERAL VASCULAR ATHERECTOMY Right 05/14/2019   Procedure: PERIPHERAL VASCULAR ATHERECTOMY;  Surgeon: Waynetta Sandy, MD;   Location: [facility];  Service: Cardiovascular;  Laterality: Right;  right SFA/pop  . PERIPHERAL VASCULAR BALLOON ANGIOPLASTY Left 08/27/2019   Procedure: PERIPHERAL VASCULAR BALLOON ANGIOPLASTY;  Surgeon: Waynetta Sandy, MD;  Location: [facility];  Service: Cardiovascular;  Laterality: Left;  popliteal  . PERIPHERAL VASCULAR INTERVENTION Right 04/29/2016   Procedure: Peripheral Vascular Intervention-Right Popliteal;  Surgeon: Waynetta Sandy, MD;  Location: [facility];  Service: Cardiovascular;  Laterality: Right;  POPLITEAL PTA  . PERIPHERAL VASCULAR INTERVENTION Right 05/14/2019   Procedure: PERIPHERAL VASCULAR INTERVENTION;  Surgeon: Waynetta Sandy, MD;  Location: [facility];  Service: Cardiovascular;  Laterality: Right;  popliteal stent  . RADIOFREQUENCY ABLATION    . TOTAL KNEE ARTHROPLASTY    . TUBAL LIGATION      Allergies Indomethacin, Pregabalin, Sulfa antibiotics, Clindamycin/lincomycin, and Morphine and related  Family History  Problem Relation Age of Onset  . Diabetes Mother   . Hyperlipidemia Mother   . Diabetes Father   . Hyperlipidemia Father   . Heart disease Father        before age 73  . Heart attack Father   . Diabetes Brother   . Hyperlipidemia Brother   . Heart attack Brother     Social History Social History   Tobacco Use  . Smoking status: Former Smoker    Types: Cigarettes    Quit date:  03/01/1998    Years since quitting: 22.2  . Smokeless tobacco: Never Used  Vaping Use  . Vaping Use: Never used  Substance Use Topics  . 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  . Drug use: Not Currently    Types: Marijuana    Comment: remote use    Review of Systems  Constitutional: No fever/chills Eyes: No visual changes. ENT: No sore throat. Cardiovascular: Denies chest pain. Respiratory: Positive shortness of  breath. Gastrointestinal: No abdominal pain.  No nausea, no vomiting.  No diarrhea.  No constipation. Genitourinary: Negative for dysuria. Musculoskeletal: Negative for back pain. Positive right leg pain.  Skin: Negative for rash. Neurological: Negative for headaches, focal weakness or numbness.  10-point ROS otherwise negative.  ____________________________________________   PHYSICAL EXAM:  VITAL SIGNS: ED Triage Vitals  Enc Vitals Group     BP --      Pulse --      Resp 05/28/20 1354 12     Temp 05/28/20 1358 97.9 F (36.6 C)     Temp Source 05/28/20 1358 Oral     SpO2 05/28/20 1354 100 %     Weight 05/28/20 1356 208 lb (94.3 kg)     Height 05/28/20 1356 5' 2"  (1.575 m)   Constitutional: Drowsy but awakens to voice. Chronically ill appearing.  Eyes: Conjunctivae are normal.  Head: Atraumatic. Nose: No congestion/rhinnorhea. Mouth/Throat: Mucous membranes are moist.  Neck: No stridor.   Cardiovascular: Normal rate, regular rhythm. Good peripheral circulation. Grossly normal heart sounds. HD cath in left chest  Respiratory: Increased respiratory effort.  No retractions. Lungs with intermittent end expiratory wheezing.  Gastrointestinal: Soft and nontender. No distention.  Musculoskeletal: s/p right BKA. Left leg without edema or rash.  Neurologic:  Normal speech and language.  Skin:  Skin is warm, dry and intact. No rash noted.   ____________________________________________   LABS (all labs ordered are listed, but only abnormal results are displayed)  Labs Reviewed  COMPREHENSIVE METABOLIC PANEL - Abnormal; Notable for the following components:      Result Value   Sodium 127 (*)    Potassium 5.3 (*)    Chloride 93 (*)    Glucose, Bld 124 (*)    BUN 34 (*)    Creatinine, Ser 1.96 (*)    Calcium 8.4 (*)    Total Protein 6.3 (*)    Albumin 2.1 (*)    AST 57 (*)    Alkaline Phosphatase 208 (*)    Total Bilirubin 1.6 (*)    GFR, Estimated 27 (*)    All other  components within normal limits  I-STAT CHEM 8, ED - Abnormal; Notable for the following components:   Sodium 122 (*)    Potassium 5.7 (*)    BUN 44 (*)    Creatinine, Ser 1.90 (*)    Glucose, Bld 116 (*)    Calcium, Ion 0.62 (*)    Hemoglobin 9.5 (*)    HCT 28.0 (*)    All other components within normal limits  RESP PANEL BY RT-PCR (FLU A&B, COVID) ARPGX2  MAGNESIUM  PHOSPHORUS  CBC WITH DIFFERENTIAL/PLATELET  CBC WITH DIFFERENTIAL/PLATELET   ____________________________________________  EKG   EKG Interpretation  Date/Time:  Wednesday May 28 2020 13:50:38 EDT Ventricular Rate:  73 PR Interval:    QRS Duration: 219 QT Interval:  539 QTC Calculation: 482 R Axis:   -78 Text Interpretation:  Paced ventricular rhythm. Confirmed by Nanda Quinton [phone]) on 05/28/2020 2:04:40 PM       ____________________________________________  RADIOLOGY  DG Chest Portable 1 View  Result Date: 05/28/2020 CLINICAL DATA:  Shortness of breath EXAM: PORTABLE CHEST 1 VIEW COMPARISON:  May 06, 2020 FINDINGS: The heart size and mediastinal contours are mildly enlarged. A right-sided central venous catheter seen with the tip at the right atrium. Aortic knob calcifications are seen. A left-sided pacemaker seen with the lead tips in the right atrium right ventricle. There is mild prominence of the central pulmonary vasculature. No large airspace consolidation or pleural effusion. No acute osseous abnormality. IMPRESSION: Mild cardiomegaly and pulmonary vascular congestion. Electronically Signed   By: Prudencio Pair M.D.   On: 05/28/2020 14:49    ____________________________________________   PROCEDURES  Procedure(s) performed:   Procedures  CRITICAL CARE Performed by: Margette Fast Total critical care time: 35 minutes Critical care time was exclusive of separately billable procedures and treating other patients. Critical care was necessary to treat or prevent imminent or life-threatening  deterioration. Critical care was time spent personally by me on the following activities: development of treatment plan with patient and/or surrogate as well as nursing, discussions with consultants, evaluation of patient's response to treatment, examination of patient, obtaining history from patient or surrogate, ordering and performing treatments and interventions, ordering and review of laboratory studies, ordering and review of radiographic studies, pulse oximetry and re-evaluation of patient's condition.  Nanda Quinton, MD Emergency Medicine  ____________________________________________   INITIAL IMPRESSION / ASSESSMENT AND PLAN / ED COURSE  Pertinent labs & imaging results that were available during my care of the patient were reviewed by me and considered in my medical decision making (see chart for details).   Patient presents to the emergency department with shortness of breath in the setting of missing dialysis today.  She does have some wheezing on exam and COPD may be contributing.  Plan for neb along with lab work.  Patient has mild hyperkalemia but some hemolysis noted.  She has pulmonary edema on her chest x-ray.  Will discuss with nephrology regarding emergent HD.   Mild hyperkalemia with no EKG changes. Plan for HD.   04:20 PM  Spoke with Nephrology regarding the case. They will consult for HD. Patient's mental status unchanged. Wheezing slightly improved with neb here. Suspect she will get more relief from HD. Plan for second neb and Nephrology consultation. Will discuss with [facility].   Discussed patient's case with [facility] to request admission. Patient and family (if present) updated with plan. Care transferred to [facility] service.  I reviewed all nursing notes, vitals, pertinent old records, EKGs, labs, imaging (as available).  ____________________________________________  FINAL CLINICAL IMPRESSION(S) / ED DIAGNOSES  Final diagnoses:  Acute respiratory failure with hypoxia (HCC)   Pulmonary congestion     MEDICATIONS GIVEN DURING THIS VISIT:  Medications  ipratropium-albuterol (DUONEB) 0.5-2.5 (3) MG/3ML nebulizer solution 3 mL (has no administration in time range)  ipratropium-albuterol (DUONEB) 0.5-2.5 (3) MG/3ML nebulizer solution 3 mL (3 mLs Nebulization Given 05/28/20 1604)     Note:  This document was prepared using Dragon voice recognition software and may include unintentional dictation errors.  Nanda Quinton, MD, [facility] Emergency Medicine    Analee Montee, [facility], MD 05/28/20 [phone]

## 2020-05-28 NOTE — ED Triage Notes (Signed)
Pt bib by ems from home w/ increased weakness and SOB, missing HD today. Pt has cough, wheezing.  C/0 of chronic left leg pain. Hx of chronic UTI - unclear if she has been taking prescribed. Has on demand pacer.   Pt on 4lpm O2 (does not use oxygen at home). cbg 145

## 2020-05-28 NOTE — ED Notes (Signed)
EDP remains at bedside trying to get access using [place].

## 2020-05-28 NOTE — ED Notes (Signed)
RT notified about breathing treatment.

## 2020-05-28 NOTE — Consult Note (Signed)
[facility]  INPATIENT CONSULTATION  Reason for Consultation: ESRD on HD and assoc conditions Requesting Provider: Dr. Laverta Baltimore, ED  HPI: Alexis [name] is an 70 y.o. female with COPD, cirrhosis, HTN, HL, recent R BKA, depression, A fib, DM CAD who is seen for evaluation and management of ESRD and pulmonary edema.   Per report pt was started on dialysis in Dec but then it was held for about a month only to be resumed during a recent hospitalization (hyperkalemia).  She currently is dialyzing in [facility] unit) MWF.  She was short of breath today but reports her husband wasn't able to use their available DME to get her into the car so she presented to ED via EMS requesting dialysis for dyspnea.    In the ED labs showed Na 127, K 5.3, BUN 34, Cr 1.96,WBC 9.5, Hb   She was recently hospitalized at [facility] and underwent R BKA.  At a follow up yesterday there was note of mild dehiscence and she was started on doxycycline.  She denies fevers, chills.    PMH: Past Medical History:  Diagnosis Date  . Allergy   . Anxiety   . Arthritis   . Asthma   . Atrial fibrillation ([place])   . CHF (congestive heart failure) ([place])   . Cirrhosis ([place])   . COPD (chronic obstructive pulmonary disease) ([place])   . Depression 05/26/2019  . Diabetes mellitus without complication ([place])   . ESRD (end stage renal disease) on dialysis ([place])   . ESRD on hemodialysis ([place]) 05/28/2020  . Hyperlipidemia   . Hypertension   . Macrocytic anemia 09/04/2018  . Myocardial infarction ([place])    several  . Pacemaker    CRT-P with RV lead and His Bundle lead ( high threshold)   . Peripheral neuropathy   . Pneumonia   . PONV (postoperative nausea and vomiting)    unsure of exactly what happened at [facility] in 2010 (knee surgery) that led to crital care  . Thyroid disease    PSH: Past Surgical History:  Procedure Laterality Date  . ABDOMINAL AORTOGRAM W/LOWER EXTREMITY N/A 04/29/2016   Procedure:  Abdominal Aortogram w/Lower Extremity;  Surgeon: Waynetta Sandy, MD;  Location: [facility];  Service: Cardiovascular;  Laterality: N/A;  . ABDOMINAL AORTOGRAM W/LOWER EXTREMITY N/A 05/06/2017   Procedure: ABDOMINAL AORTOGRAM W/LOWER EXTREMITY;  Surgeon: Angelia Mould, MD;  Location: [place] [facility];  Service: Cardiovascular;  Laterality: N/A;  bilateral  . ABDOMINAL AORTOGRAM W/LOWER EXTREMITY Bilateral 05/04/2019   Procedure: ABDOMINAL AORTOGRAM W/LOWER EXTREMITY;  Surgeon: Angelia Mould, MD;  Location: [facility];  Service: Cardiovascular;  Laterality: Bilateral;  . ABDOMINAL AORTOGRAM W/LOWER EXTREMITY Left 08/27/2019   Procedure: ABDOMINAL AORTOGRAM W/LOWER EXTREMITY;  Surgeon: Waynetta Sandy, MD;  Location: [place] [facility];  Service: Cardiovascular;  Laterality: Left;  . ABDOMINAL HYSTERECTOMY    . AMPUTATION Right 07/30/2016   Procedure: AMPUTATION RIGHT SECOND TOE;  Surgeon: Angelia Mould, MD;  Location: [facility];  Service: Vascular;  Laterality: Right;  . CARDIAC CATHETERIZATION     2005 at [place] N/A 12/25/2018   Procedure: COLONOSCOPY WITH PROPOFOL;  Surgeon: Jackquline Denmark, MD;  [facility];  Service: Endoscopy;  Laterality: N/A;  . ESOPHAGEAL BANDING  12/25/2018   Procedure: ESOPHAGEAL BANDING;  Surgeon: Jackquline Denmark, MD;  [facility];  Service: Endoscopy;;  . ESOPHAGEAL BANDING  05/01/2019  Procedure: ESOPHAGEAL BANDING;  Surgeon: Jackquline Denmark, MD;  [facility];  Service: Endoscopy;;  . ESOPHAGOGASTRODUODENOSCOPY (EGD) WITH PROPOFOL N/A 12/25/2018   Procedure: ESOPHAGOGASTRODUODENOSCOPY (EGD) WITH PROPOFOL;  Surgeon: Jackquline Denmark, MD;  [facility];  Service: Endoscopy;  Laterality: N/A;  . ESOPHAGOGASTRODUODENOSCOPY (EGD) WITH PROPOFOL N/A 05/01/2019   Procedure: ESOPHAGOGASTRODUODENOSCOPY (EGD) WITH PROPOFOL;  Surgeon: Jackquline Denmark, MD;   [facility];  Service: Endoscopy;  Laterality: N/A;  . LOWER EXTREMITY ANGIOGRAPHY Right 05/14/2019   Procedure: LOWER EXTREMITY ANGIOGRAPHY;  Surgeon: Waynetta Sandy, MD;  Location: [place] [facility];  Service: Cardiovascular;  Laterality: Right;  . PERIPHERAL VASCULAR ATHERECTOMY Right 04/29/2016   Procedure: Peripheral Vascular Atherectomy-Right Popliteal;  Surgeon: Waynetta Sandy, MD;  Location: [place] [facility];  Service: Cardiovascular;  Laterality: Right;  . PERIPHERAL VASCULAR ATHERECTOMY Right 05/14/2019   Procedure: PERIPHERAL VASCULAR ATHERECTOMY;  Surgeon: Waynetta Sandy, MD;  Location: [place] [facility];  Service: Cardiovascular;  Laterality: Right;  right SFA/pop  . PERIPHERAL VASCULAR BALLOON ANGIOPLASTY Left 08/27/2019   Procedure: PERIPHERAL VASCULAR BALLOON ANGIOPLASTY;  Surgeon: Waynetta Sandy, MD;  Location: [place] [facility];  Service: Cardiovascular;  Laterality: Left;  popliteal  . PERIPHERAL VASCULAR INTERVENTION Right 04/29/2016   Procedure: Peripheral Vascular Intervention-Right Popliteal;  Surgeon: Waynetta Sandy, MD;  Location: [place] [facility];  Service: Cardiovascular;  Laterality: Right;  POPLITEAL PTA  . PERIPHERAL VASCULAR INTERVENTION Right 05/14/2019   Procedure: PERIPHERAL VASCULAR INTERVENTION;  Surgeon: Waynetta Sandy, MD;  Location: [place] [facility];  Service: Cardiovascular;  Laterality: Right;  popliteal stent  . RADIOFREQUENCY ABLATION    . TOTAL KNEE ARTHROPLASTY    . TUBAL LIGATION      Past Medical History:  Diagnosis Date  . Allergy   . Anxiety   . Arthritis   . Asthma   . Atrial fibrillation ([place])   . CHF (congestive heart failure) ([place])   . Cirrhosis ([place])   . COPD (chronic obstructive pulmonary disease) ([place])   . Depression 05/26/2019  . Diabetes mellitus without complication ([place])   . ESRD (end stage renal disease) on dialysis ([place])   . ESRD on hemodialysis ([place])  05/28/2020  . Hyperlipidemia   . Hypertension   . Macrocytic anemia 09/04/2018  . Myocardial infarction ([place])    several  . Pacemaker    CRT-P with RV lead and His Bundle lead ( high threshold)   . Peripheral neuropathy   . Pneumonia   . PONV (postoperative nausea and vomiting)    unsure of exactly what happened at [facility] in 2010 (knee surgery) that led to crital care  . Thyroid disease     Medications:  I have reviewed the patient's current medications.  Medications Prior to Admission  Medication Sig Dispense Refill  . acetaminophen (TYLENOL) 500 MG tablet Take 500 mg by mouth as needed (pain).    Marland Kitchen albuterol (VENTOLIN HFA) 108 (90 Base) MCG/ACT inhaler Inhale 2 puffs into the lungs every 6 (six) hours as needed for wheezing or shortness of breath.    . allopurinol (ZYLOPRIM) 100 MG tablet Take 1 tablet (100 mg total) by mouth 2 (two) times daily. 60 tablet 3  . atorvastatin (LIPITOR) 10 MG tablet TAKE ONE TABLET BY MOUTH EVERY MORNING (Patient taking differently: Take 10 mg by mouth every evening.) 90 tablet 3  . budesonide-formoterol (SYMBICORT) 160-4.5 MCG/ACT inhaler Inhale 2 puffs into the lungs 2 (two) times daily. 1 each 3  .  clopidogrel (PLAVIX) 75 MG tablet TAKE ONE TABLET BY MOUTH EVERY EVENING (Patient taking differently: Take 75 mg by mouth daily.) 90 tablet 3  . Continuous Blood Gluc Receiver (FREESTYLE LIBRE 14 DAY READER) DEVI Use as directed to monitor BG.  Dx E11.65    . Continuous Blood Gluc Sensor (FREESTYLE LIBRE 14 DAY SENSOR) MISC INJECT INTO THE SKIN EVERY 14 DAYS    . ergocalciferol (VITAMIN D2) 1.25 MG (50000 UT) capsule Take 50,000 Units by mouth once a week. Take for 3 doses.    . fluticasone (FLONASE) 50 MCG/ACT nasal spray Place 2 sprays into both nostrils at bedtime. (Patient taking differently: Place 2 sprays into both nostrils in the morning and at bedtime.) 16 g 3  . Insulin Aspart FlexPen 100 UNIT/ML SOPN Inject 0-12 Units into the skin 4  (four) times daily -  before meals and at bedtime. Sliding scale: 201 - 250 = 2 units 251 - 300 = 4 units 301 - 350 = 6 units 351 - 400 = 8 units 401 - 450 = 10 units 451 - 500 = 12 units Notify MD if > 500    . insulin degludec (TRESIBA) 100 UNIT/ML FlexTouch Pen Inject 20 Units into the skin at bedtime.    . Insulin Pen Needle (PEN NEEDLES 31GX5/16") 31G X 8 MM MISC Use as needed 4 times/day.  Dx E11.65    . levocetirizine (XYZAL) 5 MG tablet Take 1 tablet (5 mg total) by mouth every evening. 30 tablet 2  . levothyroxine (SYNTHROID) 150 MCG tablet Take 150 mcg by mouth daily before breakfast. Take with 12.24mg (260m tablet cut in half) to equal 162.70m72mdaily.    . lMarland Kitchenvothyroxine (SYNTHROID) 25 MCG tablet Take 12.5 mcg by mouth daily before breakfast. Take with 150m86mablet to equal 162.70mcg61mily.    . lidMarland Kitchencaine (XYLOCAINE) 4 % external solution Apply topically.    . Melatonin 10 MG TABS Take 20 mg by mouth at bedtime.     . methocarbamol (ROBAXIN) 500 MG tablet 1 tab po bid (Patient taking differently: Take 500 mg by mouth 2 (two) times daily.) 30 tablet 0  . metoprolol succinate (TOPROL-XL) 25 MG 24 hr tablet TAKE ONE (1) TABLET BY MOUTH EVERY DAY (Patient taking differently: Take 25 mg by mouth daily. Hold for SBP <120 or HR <60) 90 tablet 1  . midodrine (PROAMATINE) 5 MG tablet Take 5 mg by mouth 3 (three) times daily.    . nysMarland Kitchenatin cream (MYCOSTATIN) Apply 1 application topically 2 (two) times daily. 30 g 1  . ondansetron (ZOFRAN-ODT) 4 MG disintegrating tablet Take 4 mg by mouth every 8 (eight) hours as needed for nausea or vomiting.     . ONEGlory RosebushO test strip 3 (three) times daily.    . oxyMarland KitchenODONE (OXY IR/ROXICODONE) 5 MG immediate release tablet Take 5 mg by mouth every 6 (six) hours as needed for pain.    . phenazopyridine (PYRIDIUM) 100 MG tablet Take 100 mg by mouth 3 (three) times daily as needed (dysuria).    . pregabalin (LYRICA) 25 MG capsule Take 25 mg by mouth every 12  (twelve) hours.    . Prenatal Vit-Fe Fumarate-FA (MULTIVITAMIN-PRENATAL) 27-0.8 MG TABS tablet Take 1 tablet by mouth daily.     . Probiotic CAPS Take 2 capsules by mouth daily. 50 billion (gummy)    . RABEprazole (ACIPHEX) 20 MG tablet Take 1 tablet (20 mg total) by mouth daily. (Patient taking differently: Take 20 mg by mouth 2 (  two) times daily.) 30 tablet 6  . torsemide (DEMADEX) 20 MG tablet Take 40 mg by mouth daily.    Marland Kitchen triamcinolone ointment (KENALOG) 0.1 % Apply to    . venlafaxine XR (EFFEXOR XR) 37.5 MG 24 hr capsule Take 1 capsule (37.5 mg total) by mouth daily with breakfast. 30 capsule 3  . AMBULATORY NON FORMULARY MEDICATION Motorized scooter.  Diagnosis: Osteoarthritis M19.90 1 each 0  . tobramycin (TOBREX) 0.3 % ophthalmic solution Place 2 drops into both eyes every 6 (six) hours. 5 mL 0    ALLERGIES:   Allergies  Allergen Reactions  . Indomethacin Other (See Comments)    Renal Insufficiency  . Pregabalin Other (See Comments)    DIZZINESS   . Sertraline Nausea And Vomiting  . Sulfa Antibiotics Hives  . Clindamycin/Lincomycin   . Morphine And Related Nausea And Vomiting    FAM HX: Family History  Problem Relation Age of Onset  . Diabetes Mother   . Hyperlipidemia Mother   . Diabetes Father   . Hyperlipidemia Father   . Heart disease Father        before age 69  . Heart attack Father   . Diabetes Brother   . Hyperlipidemia Brother   . Heart attack Brother     Social History:   reports that she quit smoking about 22 years ago. Her smoking use included cigarettes. She has never used smokeless tobacco. She reports previous drug use. Drug: Marijuana. She reports that she does not drink alcohol.  ROS: 12 system ros negative except per HPI  Blood pressure (!) 109/35, pulse (!) 56, temperature 97.8 F (36.6 C), temperature source Oral, resp. rate 20, height 5' 2"  (1.575 m), weight 94.3 kg, SpO2 94 %. PHYSICAL EXAM: Gen: obese woman who is awake and in no  distress Eyes: anicteric ENT: MMM Neck: thick neck CV: RRR, no rub Abd: soft, obese, no fluid wave, abd wall edema Lungs: dec BS bases, exp wheezes throughout, normal WOB GU: no foley Extr: 2+ LLE edema, RLE stump with ace wrap with some dried drainage Neuro: grossly nonfocal, a bit slow to answer but mainly does HD access: RIJ TDC c/d/i   Results for orders placed or performed during the hospital encounter of 05/28/20 (from the past 48 hour(s))  Comprehensive metabolic panel     Status: Abnormal   Collection Time: 05/28/20  2:06 PM  Result Value Ref Range   Sodium 127 (L) 135 - 145 mmol/L   Potassium 5.3 (H) 3.5 - 5.1 mmol/L    Comment: SLIGHT HEMOLYSIS   Chloride 93 (L) 98 - 111 mmol/L   CO2 25 22 - 32 mmol/L   Glucose, Bld 124 (H) 70 - 99 mg/dL    Comment: Glucose reference range applies only to samples taken after fasting for at least 8 hours.   BUN 34 (H) 8 - 23 mg/dL   Creatinine, Ser 1.96 (H) 0.44 - 1.00 mg/dL   Calcium 8.4 (L) 8.9 - 10.3 mg/dL   Total Protein 6.3 (L) 6.5 - 8.1 g/dL   Albumin 2.1 (L) 3.5 - 5.0 g/dL   AST 57 (H) 15 - 41 U/L   ALT 14 0 - 44 U/L   Alkaline Phosphatase 208 (H) 38 - 126 U/L   Total Bilirubin 1.6 (H) 0.3 - 1.2 mg/dL   GFR, Estimated 27 (L) >60 mL/min    Comment: (NOTE) Calculated using the CKD-EPI Creatinine Equation (2021)    Anion gap 9 5 - 15  Comment: Performed at [facility], [place] [address] [place], [place] [address]  Magnesium     Status: None   Collection Time: 05/28/20  2:06 PM  Result Value Ref Range   Magnesium 2.0 1.7 - 2.4 mg/dL    Comment: Performed at [facility], [place] [address], [place], [place] [address]  Phosphorus     Status: None   Collection Time: 05/28/20  2:06 PM  Result Value Ref Range   Phosphorus 3.5 2.5 - 4.6 mg/dL    Comment: Performed at [facility], [place] [address], [place], [place] [address]  Resp Panel by RT-PCR (Flu A&B, Covid) Nasopharyngeal Swab     Status: None   Collection  Time: 05/28/20  2:06 PM   Specimen: Nasopharyngeal Swab; Nasopharyngeal(NP) swabs in vial transport medium  Result Value Ref Range   SARS Coronavirus 2 by RT PCR NEGATIVE NEGATIVE    Comment: (NOTE) SARS-CoV-2 target nucleic acids are NOT DETECTED.  The SARS-CoV-2 RNA is generally detectable in upper respiratory specimens during the acute phase of infection. The lowest concentration of SARS-CoV-2 viral copies this assay can detect is 138 copies/mL. A negative result does not preclude SARS-Cov-2 infection and should not be used as the sole basis for treatment or other patient management decisions. A negative result may occur with  improper specimen collection/handling, submission of specimen other than nasopharyngeal swab, presence of viral mutation(s) within the areas targeted by this assay, and inadequate number of viral copies(<138 copies/mL). A negative result must be combined with clinical observations, patient history, and epidemiological information. The expected result is Negative.  Fact Sheet for Patients:  EntrepreneurPulse.com.au  Fact Sheet for Healthcare Providers:  IncredibleEmployment.be  This test is no t yet approved or cleared by the [place] FDA and  has been authorized for detection and/or diagnosis of SARS-CoV-2 by FDA under an Emergency Use Authorization (EUA). This EUA will remain  in effect (meaning this test can be used) for the duration of the COVID-19 declaration under Section 564(b)(1) of the Act, 21 U.S.C.section 360bbb-3(b)(1), unless the authorization is terminated  or revoked sooner.       Influenza A by PCR NEGATIVE NEGATIVE   Influenza B by PCR NEGATIVE NEGATIVE    Comment: (NOTE) The Xpert Xpress SARS-CoV-2/FLU/RSV plus assay is intended as an aid in the diagnosis of influenza from Nasopharyngeal swab specimens and should not be used as a sole basis for treatment. Nasal washings and aspirates are  unacceptable for Xpert Xpress SARS-CoV-2/FLU/RSV testing.  Fact Sheet for Patients: EntrepreneurPulse.com.au  Fact Sheet for Healthcare Providers: IncredibleEmployment.be  This test is not yet approved or cleared by the [place] FDA and has been authorized for detection and/or diagnosis of SARS-CoV-2 by FDA under an Emergency Use Authorization (EUA). This EUA will remain in effect (meaning this test can be used) for the duration of the COVID-19 declaration under Section 564(b)(1) of the Act, 21 U.S.C. section 360bbb-3(b)(1), unless the authorization is terminated or revoked.  Performed at [facility], [place] [address], [place], [place] [address]   I-stat chem 8, ED (not at [facility] or [facility])     Status: Abnormal   Collection Time: 05/28/20  2:35 PM  Result Value Ref Range   Sodium 122 (L) 135 - 145 mmol/L   Potassium 5.7 (H) 3.5 - 5.1 mmol/L   Chloride 100 98 - 111 mmol/L   BUN 44 (H) 8 - 23 mg/dL   Creatinine, Ser 1.90 (H) 0.44 -  1.00 mg/dL   Glucose, Bld 116 (H) 70 - 99 mg/dL    Comment: Glucose reference range applies only to samples taken after fasting for at least 8 hours.   Calcium, Ion 0.62 (LL) 1.15 - 1.40 mmol/L   TCO2 26 22 - 32 mmol/L   Hemoglobin 9.5 (L) 12.0 - 15.0 g/dL   HCT 28.0 (L) 36.0 - 46.0 %   Comment NOTIFIED PHYSICIAN   CBC with Differential/Platelet     Status: Abnormal   Collection Time: 05/28/20  2:57 PM  Result Value Ref Range   WBC 5.0 4.0 - 10.5 K/uL   RBC 2.87 (L) 3.87 - 5.11 MIL/uL   Hemoglobin 8.5 (L) 12.0 - 15.0 g/dL   HCT 27.3 (L) 36.0 - 46.0 %   MCV 95.1 80.0 - 100.0 fL   MCH 29.6 26.0 - 34.0 pg   MCHC 31.1 30.0 - 36.0 g/dL   RDW 22.1 (H) 11.5 - 15.5 %   Platelets PLATELET CLUMPS NOTED ON SMEAR, UNABLE TO ESTIMATE 150 - 400 K/uL    Comment: FIBRIN STRANDS NOTED   nRBC 0.0 0.0 - 0.2 %   Neutrophils Relative % 66 %   Neutro Abs 3.3 1.7 - 7.7 K/uL   Lymphocytes Relative 20 %   Lymphs Abs 1.0 0.7  - 4.0 K/uL   Monocytes Relative 8 %   Monocytes Absolute 0.4 0.1 - 1.0 K/uL   Eosinophils Relative 4 %   Eosinophils Absolute 0.2 0.0 - 0.5 K/uL   Basophils Relative 1 %   Basophils Absolute 0.0 0.0 - 0.1 K/uL   Immature Granulocytes 1 %   Abs Immature Granulocytes 0.03 0.00 - 0.07 K/uL    Comment: Performed at [facility], [address], [place], [place] [address]  Glucose, capillary     Status: None   Collection Time: 05/28/20  8:15 PM  Result Value Ref Range   Glucose-Capillary 94 70 - 99 mg/dL    Comment: Glucose reference range applies only to samples taken after fasting for at least 8 hours.   *Note: Due to a large number of results and/or encounters for the requested time period, some results have not been displayed. A complete set of results can be found in Results Review.    DG Chest Portable 1 View  Result Date: 05/28/2020 CLINICAL DATA:  Shortness of breath EXAM: PORTABLE CHEST 1 VIEW COMPARISON:  May 06, 2020 FINDINGS: The heart size and mediastinal contours are mildly enlarged. A right-sided central venous catheter seen with the tip at the right atrium. Aortic knob calcifications are seen. A left-sided pacemaker seen with the lead tips in the right atrium right ventricle. There is mild prominence of the central pulmonary vasculature. No large airspace consolidation or pleural effusion. No acute osseous abnormality. IMPRESSION: Mild cardiomegaly and pulmonary vascular congestion. Electronically Signed   By: Prudencio Pair M.D.   On: 05/28/2020 14:49    Assessment/Plan **dyspnea, hypoxia: CXR with pulm edema, requiring 2L [place] appearing comfortable.  In the setting of missed dialysis.  Will plan for HD tonight, see below.    **ESRD on HD: [facility] MWF - missed today due to inability to transport there.  HD with UF goal 8-6P; certainly appears to have more volume than that but will need to be removed over several treatments.  On midodrine, will need her dose prior to  HD.  Use low T and profile 2 to facilitate UF as well.   Daily weights as able, I/Os, low  na/fluid renal diet.   **Anemia: Hb in the 8s, obtain outpt ESA records tomorrow if remains admitted  **A fib: rate controlled on BB, eliquis.    **hyperkalemia:  2K dialysate. Renal diet.   **COPD: diffuse wheezing noted; nebs per primary. Home symbicort.  **Stump cellulitis: doxycycline; no SIRS.    Multiple other medical issues per primary.   Justin Mend 05/28/2020, 8:28 PM

## 2020-05-28 NOTE — ED Notes (Addendum)
Report attempted second time

## 2020-05-28 NOTE — H&P (Signed)
[facility] History & Physical  Kelly Splinter MD  Referring Provider: Dr. Nanda Quinton  BABARA BUFFALO 05/28/2020  Chief Complaint: SOB, missed today's HD HPI: The patient is a 70 y.o. year-old w/ hx of PPM, CAD/ MI, HTN, DM2, COPD, depression, cirrhosis, atrial fibrillation and ESRD on HD MWF. Pt missed HD today due to phantom limb pain after recent RLE BKA, and SOB which prevented her from transporting by [facility] to her HD session today. Last HD was Monday. In ED w/u showed low Na 127, K 5.3, BUN 34, creat 1.96, Ca 8.4, Alb 2.1.  WBC 5K Hb 8.5. CXR showed vasc congestion and R sided > L pulm edema.  Asked to see for admission.    Pt seen in ED.  Drowsy but Ox 3.  Main issue SOB but not bothering her now, on nasal O2 in ED.  No distress, no cough or sputum production. +hip and abd wall swelling. No fever, CP or abd pain.   Patient was started   Had R BKA on 05/07/20 after [facility] and saw ortho in office yesterday 3/29 for f/u.  They noted a small area of wound dehsicence and drainage and some small areas of skin necrosis. They planned for dry dressing w/ ace wrap qd, they will set her up for a stump protector and they started her on 10-day course of po doxycycline. Staples left in, f/u in 2 weeks.    Talked w/ the daughter who noted after the BKA surgery they could not care for her at home given need for transport to dialysis.  She was placed in a SNF /rehab unit but insurance only paid for 15 days, then she was dc'd home.  She states it has been difficult getting her to dialysis given her poor mobility and stump pain. Daughter states pt was in hospital around thanksgiving last year and then shortly thereafter was started on dialysis in December, 2021. She takes HD on MWF schedule. They stopped dialysis in late Jan for about 1 month, and take her catheter out , but then they started back due to high potassium possibly per the dtr. Gets HD on [address] in [place].   ROS  denies CP  no  joint pain   no HA  no blurry vision  no rash  no diarrhea  no nausea/ vomiting   Past Medical History  Past Medical History:  Diagnosis Date  . Allergy   . Anxiety   . Arthritis   . Asthma   . Atrial fibrillation ([place])   . CHF (congestive heart failure) ([place])   . Cirrhosis ([place])   . COPD (chronic obstructive pulmonary disease) ([place])   . Depression 05/26/2019  . Diabetes mellitus without complication ([place])   . Hyperlipidemia   . Hypertension   . Macrocytic anemia 09/04/2018  . Myocardial infarction ([place])    several  . Pacemaker    CRT-P with RV lead and His Bundle lead ( high threshold)   . Peripheral neuropathy   . Pneumonia   . PONV (postoperative nausea and vomiting)    unsure of exactly what happened at [facility] in 2010 (knee surgery) that led to crital care  . Thyroid disease    Past Surgical History  Past Surgical History:  Procedure Laterality Date  . ABDOMINAL AORTOGRAM W/LOWER EXTREMITY N/A 04/29/2016   Procedure: Abdominal Aortogram w/Lower Extremity;  Surgeon: Waynetta Sandy, MD;  Location: [place] [facility];  Service: Cardiovascular;  Laterality: N/A;  .  ABDOMINAL AORTOGRAM W/LOWER EXTREMITY N/A 05/06/2017   Procedure: ABDOMINAL AORTOGRAM W/LOWER EXTREMITY;  Surgeon: Angelia Mould, MD;  Location: [place] [facility];  Service: Cardiovascular;  Laterality: N/A;  bilateral  . ABDOMINAL AORTOGRAM W/LOWER EXTREMITY Bilateral 05/04/2019   Procedure: ABDOMINAL AORTOGRAM W/LOWER EXTREMITY;  Surgeon: Angelia Mould, MD;  Location: [place] [facility];  Service: Cardiovascular;  Laterality: Bilateral;  . ABDOMINAL AORTOGRAM W/LOWER EXTREMITY Left 08/27/2019   Procedure: ABDOMINAL AORTOGRAM W/LOWER EXTREMITY;  Surgeon: Waynetta Sandy, MD;  Location: [place] [facility];  Service: Cardiovascular;  Laterality: Left;  . ABDOMINAL HYSTERECTOMY    . AMPUTATION Right 07/30/2016   Procedure: AMPUTATION RIGHT SECOND TOE;  Surgeon: Angelia Mould, MD;  Location: [place];  Service: Vascular;  Laterality: Right;  . CARDIAC CATHETERIZATION     2005 at [place] N/A 12/25/2018   Procedure: COLONOSCOPY WITH PROPOFOL;  Surgeon: Jackquline Denmark, MD;  Location: [facility];  Service: Endoscopy;  Laterality: N/A;  . ESOPHAGEAL BANDING  12/25/2018   Procedure: ESOPHAGEAL BANDING;  Surgeon: Jackquline Denmark, MD;  Location: [facility];  Service: Endoscopy;;  . ESOPHAGEAL BANDING  05/01/2019   Procedure: ESOPHAGEAL BANDING;  Surgeon: Jackquline Denmark, MD;  Location: [facility];  Service: Endoscopy;;  . ESOPHAGOGASTRODUODENOSCOPY (EGD) WITH PROPOFOL N/A 12/25/2018   Procedure: ESOPHAGOGASTRODUODENOSCOPY (EGD) WITH PROPOFOL;  Surgeon: Jackquline Denmark, MD;  Location: [facility];  Service: Endoscopy;  Laterality: N/A;  . ESOPHAGOGASTRODUODENOSCOPY (EGD) WITH PROPOFOL N/A 05/01/2019   Procedure: ESOPHAGOGASTRODUODENOSCOPY (EGD) WITH PROPOFOL;  Surgeon: Jackquline Denmark, MD;  [facility];  Service: Endoscopy;  Laterality: N/A;  . LOWER EXTREMITY ANGIOGRAPHY Right 05/14/2019   Procedure: LOWER EXTREMITY ANGIOGRAPHY;  Surgeon: Waynetta Sandy, MD;  Location: [place] [facility];  Service: Cardiovascular;  Laterality: Right;  . PERIPHERAL VASCULAR ATHERECTOMY Right 04/29/2016   Procedure: Peripheral Vascular Atherectomy-Right Popliteal;  Surgeon: Waynetta Sandy, MD;  Location: [place] [facility];  Service: Cardiovascular;  Laterality: Right;  . PERIPHERAL VASCULAR ATHERECTOMY Right 05/14/2019   Procedure: PERIPHERAL VASCULAR ATHERECTOMY;  Surgeon: Waynetta Sandy, MD;  Location: [place] [facility];  Service: Cardiovascular;  Laterality: Right;  right SFA/pop  . PERIPHERAL VASCULAR BALLOON ANGIOPLASTY Left 08/27/2019   Procedure: PERIPHERAL VASCULAR BALLOON ANGIOPLASTY;  Surgeon: Waynetta Sandy, MD;  Location: [place] [facility];  Service: Cardiovascular;  Laterality: Left;   popliteal  . PERIPHERAL VASCULAR INTERVENTION Right 04/29/2016   Procedure: Peripheral Vascular Intervention-Right Popliteal;  Surgeon: Waynetta Sandy, MD;  Location: [place] [facility];  Service: Cardiovascular;  Laterality: Right;  POPLITEAL PTA  . PERIPHERAL VASCULAR INTERVENTION Right 05/14/2019   Procedure: PERIPHERAL VASCULAR INTERVENTION;  Surgeon: Waynetta Sandy, MD;  Location: [place] [facility];  Service: Cardiovascular;  Laterality: Right;  popliteal stent  . RADIOFREQUENCY ABLATION    . TOTAL KNEE ARTHROPLASTY    . TUBAL LIGATION     Family History  Family History  Problem Relation Age of Onset  . Diabetes Mother   . Hyperlipidemia Mother   . Diabetes Father   . Hyperlipidemia Father   . Heart disease Father        before age 85  . Heart attack Father   . Diabetes Brother   . Hyperlipidemia Brother   . Heart attack Brother    Social History  reports that she quit smoking about 22 years ago. Her smoking use included cigarettes. She has never used smokeless tobacco. She reports previous  drug use. Drug: Marijuana. She reports that she does not drink alcohol. Allergies  Allergies  Allergen Reactions  . Indomethacin Other (See Comments)    Renal Insufficiency  . Pregabalin Other (See Comments)    DIZZINESS   . Sulfa Antibiotics Hives  . Clindamycin/Lincomycin   . Morphine And Related Nausea And Vomiting   Home medications Prior to Admission medications   Medication Sig Start Date End Date Taking? Authorizing Provider  acetaminophen (TYLENOL) 500 MG tablet Take by mouth. 10/23/19   [provider]  albuterol (VENTOLIN HFA) 108 (90 Base) MCG/ACT inhaler Inhale 2 puffs into the lungs every 6 (six) hours as needed for wheezing or shortness of breath.    [provider]  allopurinol (ZYLOPRIM) 100 MG tablet Take 1 tablet (100 mg total) by mouth 2 (two) times daily. 01/09/20   Saguier, Percell Miller, PA-C  AMBULATORY NON FORMULARY MEDICATION  Motorized scooter.  Diagnosis: Osteoarthritis M19.90 09/10/16   Carollee Herter, Kendrick Fries R, DO  atorvastatin (LIPITOR) 10 MG tablet TAKE ONE TABLET BY MOUTH EVERY MORNING 02/20/20   Waynetta Sandy, MD  budesonide-formoterol [facility]) 160-4.5 MCG/ACT inhaler Inhale 2 puffs into the lungs 2 (two) times daily. 02/18/20   Saguier, Percell Miller, PA-C  cephALEXin (KEFLEX) 500 MG capsule Take 1 capsule (500 mg total) by mouth 2 (two) times daily. 03/27/20   Saguier, Percell Miller, PA-C  clopidogrel (PLAVIX) 75 MG tablet TAKE ONE TABLET BY MOUTH EVERY EVENING 02/20/20   Waynetta Sandy, MD  Continuous Blood Gluc Receiver (FREESTYLE LIBRE 14 DAY READER) DEVI Use as directed to monitor BG.  Dx E11.65 05/24/19   [provider]  Continuous Blood Gluc Sensor (FREESTYLE LIBRE 14 DAY SENSOR) MISC INJECT INTO THE SKIN EVERY 14 DAYS 07/18/19   [provider]  doxycycline (VIBRA-TABS) 100 MG tablet Take 1 tablet (100 mg total) by mouth 2 (two) times daily. 04/11/20   Saguier, Percell Miller, PA-C  ELIQUIS 5 MG TABS tablet Take 5 mg by mouth 2 (two) times daily. 02/01/20   [provider]  fluticasone (FLONASE) 50 MCG/ACT nasal spray Place 2 sprays into both nostrils at bedtime. Patient taking differently: Place 2 sprays into both nostrils in the morning and at bedtime.  07/09/19   Saguier, Percell Miller, PA-C  furosemide (LASIX) 20 MG tablet Take two tablets by mouth in the morning and one tablet by mouth at noon. 01/09/20   Park Liter, MD  gabapentin (NEURONTIN) 300 MG capsule Take 300 mg by mouth 3 (three) times daily. Patient not taking: Reported on 01/01/2020 11/23/19   [provider]  Insulin Aspart FlexPen 100 UNIT/ML SOPN Inject 0-10 Units into the skin daily as needed (If glucose if over 200). Sliding scale 05/11/19   [provider]  insulin degludec (TRESIBA) 100 UNIT/ML FlexTouch Pen Inject 14 Units into the skin daily.  04/30/19   [provider]  Insulin Pen  Needle (PEN NEEDLES 31GX5/16") 31G X 8 MM MISC Use as needed 4 times/day.  Dx E11.65 04/30/19   [provider]  levocetirizine (XYZAL) 5 MG tablet Take 1 tablet (5 mg total) by mouth every evening. 01/09/20   Saguier, Percell Miller, PA-C  levothyroxine (SYNTHROID) 150 MCG tablet Take 150 mcg by mouth daily before breakfast.  05/21/19   [provider]  lidocaine (XYLOCAINE) 4 % external solution Apply topically. 03/18/19   [provider]  losartan (COZAAR) 25 MG tablet Take 1 tablet (25 mg total) by mouth daily. 01/09/20   Park Liter, MD  Melatonin 10 MG TABS Take 20 mg by mouth at bedtime.     [provider]  methocarbamol (ROBAXIN) 500 MG tablet 1 tab po bid 01/30/20   Saguier, Percell Miller, PA-C  metoprolol succinate (TOPROL-XL) 25 MG 24 hr tablet TAKE ONE (1) TABLET BY MOUTH EVERY DAY 01/09/20   Saguier, Percell Miller, PA-C  nystatin cream (MYCOSTATIN) Apply 1 application topically 2 (two) times daily. 04/11/20   Saguier, Percell Miller, PA-C  ondansetron (ZOFRAN-ODT) 4 MG disintegrating tablet Take 4 mg by mouth every 8 (eight) hours as needed for nausea or vomiting.  08/16/19   [provider]  [facility] VERIO test strip 3 (three) times daily. 08/25/19   [provider]  Oxycodone HCl 10 MG TABS TAKE ONE TABLET BY MOUTH THREE TIMES DAILY AS NEEDED FOR SEVERE PAIN 05/21/20   Saguier, Percell Miller, PA-C  Prenatal Vit-Fe Fumarate-FA (MULTIVITAMIN-PRENATAL) 27-0.8 MG TABS tablet Take 1 tablet by mouth daily.     [provider]  Probiotic CAPS Take 2 capsules by mouth daily. 50 billion (gummy)    [provider]  RABEprazole (ACIPHEX) 20 MG tablet Take 1 tablet (20 mg total) by mouth daily. 01/14/20 01/13/21  Jackquline Denmark, MD  spironolactone (ALDACTONE) 100 MG tablet Take 1 tablet (100 mg total) by mouth daily. 01/14/20   Jackquline Denmark, MD  tobramycin (TOBREX) 0.3 % ophthalmic solution Place 2 drops into both eyes every 6 (six) hours. 02/18/20   Saguier,  Percell Miller, PA-C  triamcinolone ointment (KENALOG) 0.1 % Apply to 09/12/19   [provider]  venlafaxine XR (EFFEXOR XR) 37.5 MG 24 hr capsule Take 1 capsule (37.5 mg total) by mouth daily with breakfast. 05/21/20   Saguier, Percell Miller, PA-C       Exam Gen alert, no distress No rash, cyanosis or gangrene Sclera anicteric, throat clear  No jvd or bruits Chest mild bilat basilar rales, mild bilat exp wheezing, no ^wob RRR no MRG Abd soft ntnd no mass or ascites +bs diffuse abd wall edema 1-2+ GU normal MS no joint effusions or deformity Ext diffuse 2-3+ hip/ and bilat leg edema. R BKA stump wrapped in Ace wrap, pt refused removal. Just above the R knee and lateral there is an 2cmx 4cm healing open wound (trauma?) w/o odor or drainage w/ mild surrounding erythema.  Neuro is drowsy but Ox 3 and cooperative R chest HD catheter clean exit site       Home meds:  - lipitor 10/ plavix 75/ eliquis 5 bid/ lasix 40am+ 20 midday/ losartan 25 qd/ metoprolol xl 25 qd/ aldactone 100 qd  - oxycodone tid prn/ robaxin prn/ xyzal 5 qhs/ neurontin 300 tid/ effexor 37.5 qd xr  - allopurinol 100 bid/ synthroid 150 ug qd/ aciphex 20 qd  - insluin tresiba 14u qd/ aspart flexpen 0- 10u tid ac  - symbicort 2 bid  - prn's/ vitamins/ supplements/ eyedrops     CXR 3/30 - +CHF/ pulm edema R > L         Per CE dc summary in Feb 2022 > at dc aldactone and losartan were both stopped due to hyperkalemia.    Assessment/ Plan: 1. SOB - vol overload, early CHF by CXR.  ESRD pt. Renal service consulted.  2. ESRD - on HD since Dec 2021. Came off for 1 month late Jan to Feb, had to go back on HD due to high K+ per dtr.  R chest TDC.   3. Recent R BKA - done at [facility] on 05/07/20. Seen for  office f/u yesterday w/ small area of wound dehiscence/ drainage per CE notes they started her on 10 day course of po doxycycline 100 bid. Will continue here, D#2 today. Pt refused examination of the stump today, "they just wrapped  it yesterday".  4. Debility - having difficulties getting patient around per the dtr. May need SNF / rehab again. Consult CM.  5. DM2 - cont insulin glargine w/ SSI here 6. Myoclonic jerking - mild, hold lyrica and gapapentin. This happened before due to lyrica per the dtr.  7. HL - statin 8. Atrial fib - cont eliquis, metoprolol xl at lower dose 12.5 for now 9. BP/ volume - aldactone and losartan dc'd due to high K+ 1 mo ago. Cont metoprolol xl at 50% dose w/ hold orders for low BP/ HR. Continue home midodrine as well 5 tid. Pt vol overloaded. Defer to renal service.  10. COPD - symbicort, nasal O2 11. CAD hx MI - cont asa, plavix 12. Cirrhosis - not symptomatic  13. SP PPM 14. DNR - per patient and daughter confirms      Kelly Splinter  MD 05/28/2020, 4:29 PM

## 2020-05-28 NOTE — ED Notes (Signed)
IV team remains at bedside 

## 2020-05-28 NOTE — ED Notes (Signed)
Critical Lab results was given to Dr.Long.

## 2020-05-28 NOTE — ED Notes (Signed)
Report given to jenelle , rn

## 2020-05-29 DIAGNOSIS — I739 Peripheral vascular disease, unspecified: Secondary | ICD-10-CM

## 2020-05-29 DIAGNOSIS — R5381 Other malaise: Secondary | ICD-10-CM

## 2020-05-29 DIAGNOSIS — I2510 Atherosclerotic heart disease of native coronary artery without angina pectoris: Secondary | ICD-10-CM

## 2020-05-29 DIAGNOSIS — I4821 Permanent atrial fibrillation: Secondary | ICD-10-CM

## 2020-05-29 DIAGNOSIS — J449 Chronic obstructive pulmonary disease, unspecified: Secondary | ICD-10-CM

## 2020-05-29 LAB — GLUCOSE, CAPILLARY
Glucose-Capillary: 117 mg/dL — ABNORMAL HIGH (ref 70–99)
Glucose-Capillary: 112 mg/dL — ABNORMAL HIGH (ref 70–99)
Glucose-Capillary: 108 mg/dL — ABNORMAL HIGH (ref 70–99)
Glucose-Capillary: 133 mg/dL — ABNORMAL HIGH (ref 70–99)
Glucose-Capillary: 108 mg/dL — ABNORMAL HIGH (ref 70–99)

## 2020-05-29 LAB — BASIC METABOLIC PANEL
Anion gap: 7 (ref 5–15)
BUN: 17 mg/dL (ref 8–23)
CO2: 28 mmol/L (ref 22–32)
Calcium: 7.6 mg/dL — ABNORMAL LOW (ref 8.9–10.3)
Chloride: 97 mmol/L — ABNORMAL LOW (ref 98–111)
Creatinine, Ser: 1.3 mg/dL — ABNORMAL HIGH (ref 0.44–1.00)
GFR, Estimated: 44 mL/min — ABNORMAL LOW (ref >60)
Glucose, Bld: 118 mg/dL — ABNORMAL HIGH (ref 70–99)
Potassium: 2.9 mmol/L — ABNORMAL LOW (ref 3.5–5.1)
Sodium: 132 mmol/L — ABNORMAL LOW (ref 135–145)

## 2020-05-29 LAB — CBC
HCT: 27.2 % — ABNORMAL LOW (ref 36.0–46.0)
Hemoglobin: 8.2 g/dL — ABNORMAL LOW (ref 12.0–15.0)
MCH: 29 pg (ref 26.0–34.0)
MCHC: 30.1 g/dL (ref 30.0–36.0)
MCV: 96.1 fL (ref 80.0–100.0)
Platelets: 106 10*3/uL — ABNORMAL LOW (ref 150–400)
RBC: 2.83 MIL/uL — ABNORMAL LOW (ref 3.87–5.11)
RDW: 21.7 % — ABNORMAL HIGH (ref 11.5–15.5)
WBC: 4.7 10*3/uL (ref 4.0–10.5)
nRBC: 0 % (ref 0.0–0.2)

## 2020-05-29 LAB — TSH: TSH: 37.113 u[IU]/mL — ABNORMAL HIGH (ref 0.350–4.500)

## 2020-05-29 LAB — T4, FREE: Free T4: 0.73 ng/dL (ref 0.61–1.12)

## 2020-05-29 MED ORDER — HEPARIN SODIUM (PORCINE) 5000 UNIT/ML IJ SOLN: 5000.0000 [IU] | Freq: Three times a day (TID) | INTRAMUSCULAR | Status: DC

## 2020-05-29 MED ORDER — FENTANYL CITRATE (PF) 100 MCG/2ML IJ SOLN
50.0000 ug | Freq: Once | INTRAMUSCULAR | Status: AC
Start: 2020-05-29 — End: 2020-05-29
  Administered 2020-05-29: 50 ug via INTRAVENOUS
  Filled 2020-05-29: qty 2

## 2020-05-29 MED ORDER — DARBEPOETIN ALFA 60 MCG/0.3ML IJ SOSY
60.0000 ug | PREFILLED_SYRINGE | INTRAMUSCULAR | Status: DC
  Filled 2020-05-29: qty 0.3

## 2020-05-29 MED ORDER — HEPARIN SODIUM (PORCINE) 5000 UNIT/ML IJ SOLN
INTRAMUSCULAR | Status: AC
  Filled 2020-05-29: qty 1

## 2020-05-29 MED ORDER — OXYCODONE HCL 5 MG PO TABS
5.0000 mg | ORAL_TABLET | ORAL | Status: DC | PRN
  Administered 2020-05-29 – 2020-06-03 (×27): 5 mg via ORAL
  Filled 2020-05-29 (×27): qty 1

## 2020-05-29 MED ORDER — POTASSIUM CHLORIDE 20 MEQ PO PACK
20.0000 meq | PACK | Freq: Two times a day (BID) | ORAL | Status: AC
  Administered 2020-05-29 (×2): 20 meq via ORAL
  Filled 2020-05-29 (×2): qty 1

## 2020-05-29 MED ORDER — APIXABAN 5 MG PO TABS
5.0000 mg | ORAL_TABLET | Freq: Two times a day (BID) | ORAL | Status: DC
  Administered 2020-05-29 – 2020-06-03 (×10): 5 mg via ORAL
  Filled 2020-05-29 (×10): qty 1

## 2020-05-29 MED ORDER — INSULIN GLARGINE 100 UNIT/ML ~~LOC~~ SOLN
10.0000 [IU] | Freq: Every day | SUBCUTANEOUS | Status: DC
  Administered 2020-05-30 – 2020-06-02 (×4): 10 [IU] via SUBCUTANEOUS
  Filled 2020-05-29 (×5): qty 0.1

## 2020-05-29 NOTE — Assessment & Plan Note (Signed)
-  Prior notes reviewed under care everywhere -Continue Plavix

## 2020-05-29 NOTE — Progress Notes (Signed)
[facility] night shift.  The patient told the staff that she is currently having pain in her wound rated at 10/10.  She is on oxycodone every 6 hours as needed, but is not due yet for another dose.  Fentanyl 50 mcg IVP x1 dose ordered.  Tennis Must, MD.

## 2020-05-29 NOTE — Assessment & Plan Note (Signed)
-  Continue Toprol and midodrine

## 2020-05-29 NOTE — Plan of Care (Signed)
  Problem: Education: Goal: Knowledge of General Education information will improve Description: Including pain rating scale, medication(s)/side effects and non-pharmacologic comfort measures Outcome: Progressing   Problem: Clinical Measurements: Goal: Will remain free from infection Outcome: Progressing   Problem: Pain Managment: Goal: General experience of comfort will improve Outcome: Progressing

## 2020-05-29 NOTE — Hospital Course (Addendum)
Alexis [name] is a 70 yo female with PMH PAD, recent R BKA, CAD, HTN, DMII, COPD, PAF (no longer on Eliquis 2/2  recent GIB and unclear if this was to be restarted after Hgb stabilized), ESRD-MWF HD, depression, cirrhosis, obesity, anxiety, osteoarthritis, hypothyroidism who was admitted with worsening pain in her recent R BKA site as well as significant difficulty mobilizing especially forgetting to her most recent dialysis session.  Family has had great difficulty with getting patient out of bed in order to transport her to dialysis.  Due to being unable to go to her session on 05/28/2020, she was instructed to call EMS for patient to be transferred to the hospital.  She has an extensive vascular history and was recently treated at [facility].  Recent discharge summaries have been reviewed from surgery as well as GI from a prior GI bleed.  She was taken off of Eliquis but there is difficulty understanding if this was to be resumed with and/or without her plavix.    PT/OT were consulted on admission given her difficulty with mobility at home.  Patient and family are amenable for placement in rehab if recommended at time of discharge.  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  She was found to have a partially dehisced wound of her BKA.  She was placed in a dry dressing with Ace wrap and stump protector being ordered; also started on 10-day course of doxycycline.  She was instructed to keep staples in place and return for follow-up again in 2 weeks.  After multiple discussions during hospitalization, patient ultimately decided to discharge to her home with hospice in place.  She understands dialysis will be discontinued and comfort care would be focus.  She was offered placement in rehab multiple times and declined.  She was also evaluated by psychiatry and deemed to have capacity for decision-making.  Hospice was  arranged with [facility] upon discharge and her dialysis center was also informed of her decision.

## 2020-05-29 NOTE — Assessment & Plan Note (Addendum)
-   s/p RAI ablation -Continue home Synthroid

## 2020-05-29 NOTE — Progress Notes (Signed)
Patient back on the floor from HD

## 2020-05-29 NOTE — Plan of Care (Signed)
  Problem: Clinical Measurements: Goal: Ability to maintain clinical measurements within normal limits will improve Outcome: Progressing   

## 2020-05-29 NOTE — Assessment & Plan Note (Addendum)
-  Patient having significant difficulty with mobilization at home since surgery and family had great difficulty being able to transport patient to dialysis safely on 05/28/2020.  Rehab has now been declined by patient; this was offered multiple times and she also underwent psychiatry evaluation and was deemed to have capacity for decision-making -After several discussions with patient and family members, the patient has ultimately decided to discontinue dialysis treatments and discharged home with hospice in place and focusing on comfort care

## 2020-05-29 NOTE — Assessment & Plan Note (Signed)
-   asymptomatic; likely NASH - last U/S reviewed from 09/18/19: "Coarse echotexture of the liver consistent with cirrhosis/fatty Infiltration"

## 2020-05-29 NOTE — Assessment & Plan Note (Signed)
-  Continue Symbicort and oxygen

## 2020-05-29 NOTE — TOC Initial Note (Addendum)
Transition of Care [facility]) - Initial/Assessment Note    Patient Details  Name: Alexis [name] MRN: [id number] Date of Birth: [birth year]  Transition of Care [facility]) CM/SW Contact:    Bartholomew Crews, RN Phone Number: [phone] 05/29/2020, 5:06 PM  Clinical Narrative:                  Received call from Butch Penny, [place] [facility] who are contracted through patient's insurance, [employer], to help provide community support.   Social worker, Juliann Pulse, has provided ramp resources; however, patient was contacted but advised that she didn't need a ramp. Butch Penny to follow up with resources, since patient is in need of a ramp for successful transition to community.   HTA has also arranged home care services (20 hrs/week) through [place]. Patient had previously declined the services; however, has agreed to additional home care services during conversation this afternoon at bedside.   Spoke with patient at the bedside. She lives with her husband who she says takes good care of her. She was discharged from a rehab facility about 4 days prior to this admission. She attends dialysis at [facility] on [address] on MWF 11 am, and uses non-Medicaid wheelchair transportation. She has DME in the home to include a hospital bed; hoyer lift; bsc; rw; wheelchair; and scooter. She said she had previously received [place] services through [facility].   Discussed previous referral to [facility]. Patient stated that they could not accept her d/t she was not ready to stop dialysis. Discussed alternate hospice agency that may be able to accept patient while she continues with dialysis and can be coordinated with her [facility] hours. Patient was interested in learning more about this.   Patient provided verbal consent to discuss transition plans with her husband and her daughter. Spoke with her daughter, Almyra Free, on the phone. Patient recently discharged from [facility] in [place]. Discussed  possible discharge planning to a nursing facility for LTC where social work at the facility would assist with LTC Medicaid application vs home with hospice/home care services. Almyra Free stated that she was interested in learning more about home hospice option. Almyra Free also mentioned that patient would need a ramp.   Referral pending acceptance with [facility] - will follow up tomorrow.   If patient to transition home, will need ambulance transport.   Spoke with patient at the bedside to discuss previous refusal for ramp and home care services. Patient in agreement with these needs and stated she was not coherent. She is agreeable to family assisting with coordination of services.   TOC following for transition needs.   Expected Discharge Plan: Home w Hospice Care Barriers to Discharge: Continued Medical Work up   Patient Goals and CMS Choice Patient states their goals for this hospitalization and ongoing recovery are:: home with family/hospice support CMS Medicare.gov Compare Post Acute Care list provided to:: Patient Choice offered to / list presented to : Patient  Expected Discharge Plan and Services Expected Discharge Plan: Home w Hospice Care In-house Referral: Clinical Social Work,Hospice / Palliative Care Discharge Planning Services: CM Consult Post Acute Care Choice: Hospice Living arrangements for the past 2 months: Single Family Home                 DME Arranged: N/A DME Agency: NA       HH Arranged: NA HH Agency: NA        Prior Living Arrangements/Services Living arrangements for the past  2 months: Single Family Home Lives with:: Self,Spouse Patient language and need for interpreter reviewed:: Yes        Need for Family Participation in Patient Care: Yes (Comment)   Current home services: DME,Home RN (hospital bed; wheelchair; scooter; bsc; rw; hoyer lift) Criminal Activity/Legal Involvement Pertinent to Current Situation/Hospitalization: No - Comment as  needed  Activities of Daily Living      Permission Sought/Granted Permission sought to share information with : Family Supports Permission granted to share information with : Yes, Verbal Permission Granted  Share Information with NAME: Jiyah Torpey; Lara Mulch     Permission granted to share info w Relationship: spouse; daughter  Permission granted to share info w Contact Information: Almyra Free [phone];  Juanda Crumble [phone]  Emotional Assessment Appearance:: Appears stated age Attitude/Demeanor/Rapport: Engaged Affect (typically observed): Accepting Orientation: : Oriented to Self,Oriented to  Time,Oriented to Place,Oriented to Situation Alcohol / Substance Use: Not Applicable Psych Involvement: No (comment)  Admission diagnosis:  Pulmonary congestion [R09.89] Pulmonary edema [J81.1] Acute respiratory failure with hypoxia ([place]) [J96.01] Patient Active Problem List   Diagnosis Date Noted  . Physical deconditioning 05/29/2020  . COPD (chronic obstructive pulmonary disease) ([place]) 05/29/2020  . CAD (coronary artery disease) 05/29/2020  . S/P BKA (below knee amputation), right ([place]) 05/28/2020  . Pulmonary edema 05/28/2020  . SOB (shortness of breath) 05/28/2020  . Wound infection 05/28/2020  . ESRD on hemodialysis ([place]) 05/28/2020  . Acute respiratory failure with hypoxia ([place])   . Depression 05/26/2019  . Cellulitis 05/26/2019  . Other cirrhosis of liver ([place]) 05/26/2019  . Chronic systolic CHF (congestive heart failure) ([place]) 05/26/2019  . Cellulitis in diabetic foot ([place]) 05/25/2019  . Epigastric pain   . Idiopathic esophageal varices without bleeding ([place])   . Anemia due to chronic blood loss   . Paroxysmal atrial fibrillation ([place]) 09/26/2018  . Macrocytic anemia 09/04/2018  . Pacemaker 08/25/2018  . Insulin dependent diabetes mellitus with complications 42/59/5638  . Constipation 08/25/2018  . Volume overload 08/25/2018  . Chronic anticoagulation 08/25/2018   . Morbid obesity ([place]) 08/25/2018  . Chronic diastolic heart failure ([place]) 05/17/2018  . Acute bronchitis with COPD ([place]) 01/16/2018  . Acquired absence of other right toe(s) ([place]) 11/16/2017  . Type 2 diabetes mellitus with foot ulcer ([place]) 11/16/2017  . Crush injury of left foot, initial encounter 11/28/2016  . Osteomyelitis ([place]) 07/30/2016  . Gangrene of foot ([place]) 04/27/2016  . Thrombocytopenia ([place])   . Vomiting and diarrhea 04/05/2016  . Hyperlipidemia 11/18/2015  . Gout 08/05/2014  . Cough 08/05/2014  . Allergic rhinitis 07/12/2014  . Osteoarthritis 07/12/2014  . Asthma 07/12/2014  . Congestive heart failure ([place]) 07/12/2014  . Permanent atrial fibrillation ([place]) 07/12/2014  . HTN (hypertension) 07/12/2014  . Hypothyroidism 07/12/2014  . History of substance abuse ([place]) 07/12/2014  . Peripheral vascular disease ([place]) 04/02/2013  . Discoloration of skin 04/02/2013   PCP:  Mackie Pai, PA-C Pharmacy:   [facility] - [place], [place] - [address] [place] [address] Phone: [phone] Fax: [phone]  [place], [place] [address] Phone: [phone] Fax: [phone]  [place], [place] [address] [place] [address] Phone: [phone] Fax: [phone]     Social Determinants of Health (SDOH) Interventions    Readmission Risk Interventions No flowsheet  data found.

## 2020-05-29 NOTE — Assessment & Plan Note (Addendum)
-  Patient missed outpatient session on 05/28/2020 due to difficulty with mobilization from her recent right BKA -See physical deconditioning discussion.  Patient wishing to discontinue dialysis completely at this point and discharging home with hospice

## 2020-05-29 NOTE — Plan of Care (Signed)
  Problem: Education: Goal: Knowledge of General Education information will improve Description Including pain rating scale, medication(s)/side effects and non-pharmacologic comfort measures Outcome: Progressing   Problem: Health Behavior/Discharge Planning: Goal: Ability to manage health-related needs will improve Outcome: Progressing   

## 2020-05-29 NOTE — Evaluation (Signed)
Physical Therapy Evaluation Patient Details Name: Alexis [name] MRN: [id number] DOB: May 17, 1950 Today's Date: 05/29/2020   History of Present Illness  Pt is a 70 y/o female admitted secondary to increased SOB on 3/30. Likely secondary to volume overload. Pt with recent R BKA that was done at [facility]. [place] includes ESRD on HD, COPD, dCHF, and s/p R BKA.  Clinical Impression  Pt admitted secondary to problem above with deficits below. Pt presenting with some confusion and reporting conflicting information throughout. Pt with increased pain, so wanted to defer OOB mobility. Requiring min A to roll. Performed HEP as well. Educated about desensitization techniques to help with phantom limb pain. Pt reporting increased difficulty with mobility tasks since discharging home following R BKA. Recommending SNF level therapies at d/c to increase independence and safety.     Follow Up Recommendations SNF;Supervision/Assistance - 24 hour    Equipment Recommendations  None recommended by PT    Recommendations for Other Services       Precautions / Restrictions Precautions Precautions: Fall Precaution Comments: R BKA Restrictions Weight Bearing Restrictions: No      Mobility  Bed Mobility Overal bed mobility: Needs Assistance Bed Mobility: Rolling Rolling: Min assist (with bed rail)         General bed mobility comments: Pt did not want to get OOB this session, so performed bed mobility tasks. Min A for rolling with use of bed rails.    Transfers                    Ambulation/Gait                Stairs            Wheelchair Mobility    Modified Rankin (Stroke Patients Only)       Balance                                             Pertinent Vitals/Pain Pain Assessment: Faces Faces Pain Scale: Hurts whole lot Pain Location: R residual limb Pain Descriptors / Indicators: Aching;Operative site guarding Pain Intervention(s):  Limited activity within patient's tolerance;Monitored during session;Repositioned    Home Living Family/patient expects to be discharged to:: Private residence Living Arrangements: Spouse/significant other Available Help at Discharge: Family Type of Home: House Home Access: Ramped entrance     Home Layout: One level Home Equipment: Environmental consultant - 2 wheels;Bedside commode;Wheelchair - manual;Hospital bed;Other (comment) (hoyer lift with pad.)      Prior Function Level of Independence: Needs assistance   Gait / Transfers Assistance Needed: Pt reports she has been having to use hoyer lift after d/c home with SNF, but has mostly been in the bed.  ADL's / Homemaking Assistance Needed: Pt reports husband will put "diaper" on her when he is gone during the day. Has had sponge baths.        Hand Dominance        Extremity/Trunk Assessment   Upper Extremity Assessment Upper Extremity Assessment: Defer to OT evaluation    Lower Extremity Assessment Lower Extremity Assessment: RLE deficits/detail RLE Deficits / Details: R BKA. Pt with phantom limb pain       Communication   Communication: No difficulties  Cognition Arousal/Alertness: Awake/alert Behavior During Therapy: Flat affect Overall Cognitive Status: No family/caregiver present to determine baseline cognitive functioning  General Comments: Pt initially reporting she does not have anyone at home, then reports she lives with her husband. Asking about a diaper to be put on when NT was in like last time, however, pt had not had a diaper during admission. Slow processing noted. Pt also reporting her mother helps her, however, then states her mother has been deceased since 58.      General Comments      Exercises General Exercises - Lower Extremity Ankle Circles/Pumps: AROM;Left;10 reps;Supine Heel Slides: AROM;Left;10 reps;Supine Amputee Exercises Quad Sets: AROM;Both;5  reps;Supine Straight Leg Raises: AROM;Right;5 reps;Supine   Assessment/Plan    PT Assessment Patient needs continued PT services  PT Problem List Decreased range of motion;Decreased strength;Decreased activity tolerance;Decreased balance;Decreased mobility;Decreased knowledge of use of DME;Decreased knowledge of precautions;Pain       PT Treatment Interventions DME instruction;Functional mobility training;Therapeutic activities;Therapeutic exercise;Balance training;Patient/family education    PT Goals (Current goals can be found in the Care Plan section)  Acute Rehab PT Goals Patient Stated Goal: to go to rehab PT Goal Formulation: With patient Time For Goal Achievement: 06/12/20 Potential to Achieve Goals: Fair    Frequency Min 2X/week   Barriers to discharge        Co-evaluation               AM-PAC PT "6 Clicks" Mobility  Outcome Measure Help needed turning from your back to your side while in a flat bed without using bedrails?: A Little Help needed moving from lying on your back to sitting on the side of a flat bed without using bedrails?: A Lot Help needed moving to and from a bed to a chair (including a wheelchair)?: A Lot Help needed standing up from a chair using your arms (e.g., wheelchair or bedside chair)?: Total Help needed to walk in hospital room?: Total Help needed climbing 3-5 steps with a railing? : Total 6 Click Score: 10    End of Session   Activity Tolerance: Patient limited by pain Patient left: in bed;with call bell/phone within reach;with bed alarm set Nurse Communication: Mobility status PT Visit Diagnosis: Muscle weakness (generalized) (M62.81);Unsteadiness on feet (R26.81);Pain Pain - Right/Left: Right Pain - part of body: Leg    Time: 1435-1453 PT Time Calculation (min) (ACUTE ONLY): 18 min   Charges:   PT Evaluation $PT Eval Moderate Complexity: 1 Mod          Reuel Derby, PT, DPT  Acute Rehabilitation Services  Pager: [phone] Office: [phone]   Rudean Hitt 05/29/2020, 3:18 PM

## 2020-05-29 NOTE — Progress Notes (Signed)
[facility] Progress Note   Subjective:   Feeling much better after 3.5L UF yesterday.  Intense phantom limb pain overnight tx with oxycodone.  No other new issues.    Objective Vitals:   05/29/20 0030 05/29/20 0100 05/29/20 0105 05/29/20 0131  BP: 96/74 100/61 115/61 (!) 115/44  Pulse: (!) 54 88 (!) 53 (!) 53  Resp: 20 20 20 20   Temp:   97.9 F (36.6 C) 98.1 F (36.7 C)  TempSrc:   Oral Oral  SpO2:  98% 98% 96%  Weight:   95.5 kg   Height:       Physical Exam General: looks much improved - alert, comfortable Heart: irreg irreg, no rub Lungs: a few faint exp wheezes, normal WOB,  Much improve Abdomen: soft + abd wall edema Extremities: 2+ pitting edema L but improved, R BKA stump dressing intact Dialysis Access:  [facility] d/c/i  Additional Objective Labs: Basic Metabolic Panel: Recent Labs  Lab 05/28/20 1406 05/28/20 1435 05/29/20 0454  NA 127* 122* 132*  K 5.3* 5.7* 2.9*  CL 93* 100 97*  CO2 25  --  28  GLUCOSE 124* 116* 118*  BUN 34* 44* 17  CREATININE 1.96* 1.90* 1.30*  CALCIUM 8.4*  --  7.6*  PHOS 3.5  --   --    Liver Function Tests: Recent Labs  Lab 05/28/20 1406  AST 57*  ALT 14  ALKPHOS 208*  BILITOT 1.6*  PROT 6.3*  ALBUMIN 2.1*   No results for input(s): LIPASE, AMYLASE in the last 168 hours. CBC: Recent Labs  Lab 05/28/20 1435 05/28/20 1457 05/29/20 0454  WBC  --  5.0 4.7  NEUTROABS  --  3.3  --   HGB 9.5* 8.5* 8.2*  HCT 28.0* 27.3* 27.2*  MCV  --  95.1 96.1  PLT  --  PLATELET CLUMPS NOTED ON SMEAR, UNABLE TO ESTIMATE 106*   Blood Culture    Component Value Date/Time   SDES  05/25/2019 1740    LEFT ANTECUBITAL Performed at [facility], [address] [place], [place] [address]    [facility]  05/25/2019 1740    BOTTLES DRAWN AEROBIC AND ANAEROBIC Blood Culture results may not be optimal due to an inadequate volume of blood received in culture bottles Performed at [facility], [address] [place], [place] [address]    CULT  05/25/2019 1740    NO GROWTH 5 DAYS Performed at [facility], [place] [address] [place], [place] [address]    REPTSTATUS 05/30/2019 FINAL 05/25/2019 1740    Cardiac Enzymes: No results for input(s): CKTOTAL, CKMB, CKMBINDEX, TROPONINI in the last 168 hours. CBG: Recent Labs  Lab 05/28/20 2015 05/29/20 0122  GLUCAP 94 117*   Iron Studies: No results for input(s): IRON, TIBC, TRANSFERRIN, FERRITIN in the last 72 hours. @lablastinr3 @ Studies/Results: DG Chest Portable 1 View  Result Date: 05/28/2020 CLINICAL DATA:  Shortness of breath EXAM: PORTABLE CHEST 1 VIEW COMPARISON:  May 06, 2020 FINDINGS: The heart size and mediastinal contours are mildly enlarged. A right-sided central venous catheter seen with the tip at the right atrium. Aortic knob calcifications are seen. A left-sided pacemaker seen with the lead tips in the right atrium right ventricle. There is mild prominence of the central pulmonary vasculature. No large airspace consolidation or pleural effusion. No acute osseous abnormality. IMPRESSION: Mild cardiomegaly and pulmonary vascular congestion. Electronically Signed   By: Prudencio Pair M.D.   On:  05/28/2020 14:49   Medications: . sodium chloride     . allopurinol  100 mg Oral BID  . atorvastatin  10 mg Oral QPM  . Chlorhexidine Gluconate Cloth  6 each Topical Q0600  . clopidogrel  75 mg Oral Daily  . doxycycline  100 mg Oral Q12H  . insulin aspart  0-5 Units Subcutaneous QHS  . insulin aspart  0-9 Units Subcutaneous TID WC  . insulin glargine  20 Units Subcutaneous QHS  . ipratropium-albuterol  3 mL Nebulization Once  . levothyroxine  12.5 mcg Oral Q0600  . levothyroxine  150 mcg Oral Q0600  . loratadine  5 mg Oral QPM  . metoprolol succinate  12.5 mg Oral Daily  . midodrine  5 mg Oral TID WC  . mometasone-formoterol  2 puff Inhalation BID  . pantoprazole  40 mg Oral Daily  . prenatal multivitamin  1 tablet Oral  Q0600  . sodium chloride flush  3 mL Intravenous Q12H  . tobramycin  2 drop Both Eyes Q6H  . venlafaxine XR  37.5 mg Oral Q breakfast      Assessment/Plan **dyspnea, hypoxia: CXR with pulm edema in setting of missed dialysis.  Clinially improved s/p HD last PM - UF 3.5L    **ESRD on HD: [facility] MWF - missed Wed due to inability to transport there. Will need to involve SW given this transportation issue related to her weakness/husband inability to move around will continue. Plan next HD tomorrow.  Maximize UF with edema.   Daily weights as able, I/Os, low na/fluid renal diet.   **Anemia: Hb in the 8s, transfused < 7.  Check iron indices and start ESA  **A fib: rate controlled on BB, eliquis.    **hyperkalemia:  Resolved and now 2.9.  Supplement.  Use 4K tomorrow.  **COPD: diffuse wheezing noted on admission; nebs per primary. Home symbicort.  **Stump cellulitis: doxycycline; no SIRS.    Multiple other medical issues per primary.   Jannifer Hick MD 05/29/2020, 8:05 AM  [facility] Pager: [phone]

## 2020-05-29 NOTE — Assessment & Plan Note (Addendum)
-   s/p surgery on 05/07/20 - see wound infection  -Continue pain control.  Robaxin added for muscle spasms

## 2020-05-29 NOTE — Assessment & Plan Note (Signed)
-   not on asa 2/2 GIB hx and also is on plavix/eliquis - continue lipitor, plavix, Toprol; not on ACEi due to hx hyperK

## 2020-05-29 NOTE — Progress Notes (Signed)
Patient off the floor going to hemodialysis

## 2020-05-29 NOTE — Assessment & Plan Note (Signed)
-   volume overloaded 2/2 ESRD - continue chronic HD - last echo 3/0/1599: EF 68%, diastolic function unable to be evaluated due to A. fib.  LV global hypokinesis without RWMA

## 2020-05-29 NOTE — Progress Notes (Signed)
Renal Navigator contacted [facility] to request outpatient orders and spoke with RN/Marie. She will fax records to the HD unit here. She states patient is very new to the clinic. She states patient started outpatient HD on 05/06/20, BKA on 3/9 and then was back in the clinic from 3/16-3/28. She reports inability for EMS to enter the home adequately, she thinks due to structure--perhaps a ramp is needed.  Navigator faxed H&P to clinic to provide continuity of care and will also fax discharge summary at discharge.   Alphonzo Cruise, [place] Renal Navigator [phone]

## 2020-05-29 NOTE — Progress Notes (Signed)
PROGRESS NOTE    Alexis [name]   ZOX:[id number]  DOB: Feb 08, 1951  DOA: 05/28/2020     1  PCP: Alexis [name]  CC: SOB, difficulty getting out of the house  Hospital Course: Alexis [name] is a 70 yo female with PMH PAD, recent R BKA, CAD, HTN, DMII, COPD, PAF (no longer on Eliquis 2/2  recent GIB and unclear if this was to be restarted after Hgb stabilized), ESRD-MWF HD, depression, cirrhosis, obesity, anxiety, osteoarthritis, hypothyroidism who was admitted with worsening pain in her recent R BKA site as well as significant difficulty mobilizing especially forgetting to her most recent dialysis session.  Family has had great difficulty with getting patient out of bed in order to transport her to dialysis.  Due to being unable to go to her session on 05/28/2020, she was instructed to call EMS for patient to be transferred to the hospital.  She has an extensive vascular history and was recently treated at [facility].  Recent discharge summaries have been reviewed from surgery as well as GI from a prior GI bleed.  She was taken off of Eliquis but there is difficulty understanding if this was to be resumed with and/or without her plavix.    PT/OT were consulted on admission given her difficulty with mobility at home.  Patient and family are amenable for placement in rehab if recommended at time of discharge.  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  She was found to have a partially dehisced wound of her BKA.  She was placed in a dry dressing with Ace wrap and stump protector being ordered; also started on 10-day course of doxycycline.  She was instructed to keep staples in place and return for follow-up again in 2 weeks.   Interval History:  Seen this morning in her room, daughter was also arriving and spoke with her in the hallway.  They endorse significant difficulty getting the patient out  of the house to her dialysis sessions now due to her recent BKA.  At this time, patient does want to go to rehab if recommended as she understands there is more difficulty now getting to appointments especially dialysis. Her shortness of breath and breathing has improved after undergoing dialysis yesterday on admission.  ROS: Constitutional: positive for fatigue, negative for chills and fevers, Respiratory: negative for cough, Cardiovascular: negative for chest pain and Gastrointestinal: negative for abdominal pain  Assessment & Plan: Physical deconditioning -Patient having significant difficulty with mobilization at home since surgery and family had great difficulty being able to transport patient to dialysis safely on 05/28/2020.  Patient is amenable for placement in rehab if needed -Follow-up PT/OT recommendations  Wound infection - seen by ortho surgery prior to admission; partial dehiscence of right BKA.  Dry gauze and Ace wrap placed.  Staples to remain in place per orthopedic surgery. -Continue 10-day course of doxycycline -Follow-up with orthopedic surgery in 2 weeks  ESRD on hemodialysis [facility]) -Patient missed outpatient session on 05/28/2020 due to difficulty with mobilization from her recent right BKA -Nephrology following while inpatient.  Patient underwent dialysis on admission on 05/28/2020 and will continue on regular schedule (MWF)  Permanent atrial fibrillation ([place]) - from discharge summary at [facility]: "Patient had extensive history of left femoral embolectomy and 4 compartment fasciotomies with right lower extremity bypass graft done in December 2021. Had been on Plavix and Eliquis which has been  withheld because of acute GI bleed. Discussed with GI about anticoagulation, given patient received Eliquis on 04/21/2020 and also Plavix held. Vascular surgery consulted for follow-up. As per discussion with vascular surgery, no emergent surgical intervention needed at this time. Okay to  hold off on antiplatelets/anticoagulants at this time but recommended to resume home medications including Plavix and Eliquis once her anemia is addressed." - given that her Hgb seems to be stable in the 8-9 g/dL range, will restart Eliquis again for now; if re-bleeds this likely will be discontinued; I will have her follow up with vascular again at discharge   Peripheral vascular disease ([place]) -Prior notes reviewed under care everywhere -Continue Plavix  CAD (coronary artery disease) - not on asa 2/2 GIB hx and also is on plavix/eliquis - continue lipitor, plavix, Toprol; not on ACEi due to hx hyperK  COPD (chronic obstructive pulmonary disease) (HCC) -Continue Symbicort and oxygen  SOB (shortness of breath) -Presumed due to volume overload.  Continuing on dialysis as noted  S/P BKA (below knee amputation), right ([place]) - s/p surgery on 05/07/20 - see wound infection   Chronic systolic CHF (congestive heart failure) (HCC) - volume overloaded 2/2 ESRD - continue chronic HD - last echo 05/30/6604: EF 30%, diastolic function unable to be evaluated due to A. fib.  LV global hypokinesis without RWMA  Other cirrhosis of liver (HCC) - asymptomatic; likely NASH - last U/S reviewed from 09/18/19: "Coarse echotexture of the liver consistent with cirrhosis/fatty Infiltration"  Volume overload -Continue on dialysis schedule per nephrology  Hypothyroidism - s/p RAI ablation -Continue home Synthroid  HTN (hypertension) -Continue Toprol and midodrine    Old records reviewed in assessment of this patient  Antimicrobials: Doxycycline  DVT prophylaxis:  apixaban (ELIQUIS) tablet 5 mg   Code Status:   Code Status: DNR Family Communication: Daughter  Disposition Plan: Status is: Inpatient  Remains inpatient appropriate because:IV treatments appropriate due to intensity of illness or inability to take PO and Inpatient level of care appropriate due to severity of illness   Dispo: The  patient is from: Home              Anticipated d/c is to: SNF              Patient currently is not medically stable to d/c.   Difficult to place patient No   Risk of unplanned readmission score: Unplanned Admission- Pilot do not use: 24.86   Objective: Blood pressure (!) 134/36, pulse (!) 54, temperature 98.2 F (36.8 C), temperature source Oral, resp. rate 18, height 5' 2"  (1.575 m), weight 95.5 kg, SpO2 98 %.  Examination: General appearance: alert, cooperative and no distress Head: Normocephalic, without obvious abnormality, atraumatic Eyes: EOMI Lungs: coarse breath sounds  Chest: left Perm cath in place Heart: irregularly irregular rhythm and S1, S2 normal Abdomen: obese, soft, NT, ND, BS present Extremities: R BKD noted with dressing in place and ACE wrap. 2-3+ LE edema up to thighs Skin: mobility and turgor normal Neurologic: Grossly normal  Consultants:   Nephrology  Procedures:     Data Reviewed: I have personally reviewed following labs and imaging studies Results for orders placed or performed during the hospital encounter of 05/28/20 (from the past 24 hour(s))  Glucose, capillary     Status: None   Collection Time: 05/28/20  8:15 PM  Result Value Ref Range   Glucose-Capillary 94 70 - 99 mg/dL  MRSA PCR Screening     Status: None   Collection  Time: 05/28/20  8:29 PM   Specimen: Nasopharyngeal  Result Value Ref Range   MRSA by PCR NEGATIVE NEGATIVE  Hemoglobin A1c     Status: Abnormal   Collection Time: 05/28/20  8:56 PM  Result Value Ref Range   Hgb A1c MFr Bld 5.7 (H) 4.8 - 5.6 %   Mean Plasma Glucose 116.89 mg/dL  Hepatitis B surface antigen     Status: None   Collection Time: 05/28/20  9:21 PM  Result Value Ref Range   Hepatitis B Surface Ag NON REACTIVE NON REACTIVE  Glucose, capillary     Status: Abnormal   Collection Time: 05/29/20  1:22 AM  Result Value Ref Range   Glucose-Capillary 117 (H) 70 - 99 mg/dL  Basic metabolic panel     Status:  Abnormal   Collection Time: 05/29/20  4:54 AM  Result Value Ref Range   Sodium 132 (L) 135 - 145 mmol/L   Potassium 2.9 (L) 3.5 - 5.1 mmol/L   Chloride 97 (L) 98 - 111 mmol/L   CO2 28 22 - 32 mmol/L   Glucose, Bld 118 (H) 70 - 99 mg/dL   BUN 17 8 - 23 mg/dL   Creatinine, Ser 1.30 (H) 0.44 - 1.00 mg/dL   Calcium 7.6 (L) 8.9 - 10.3 mg/dL   GFR, Estimated 44 (L) >60 mL/min   Anion gap 7 5 - 15  CBC     Status: Abnormal   Collection Time: 05/29/20  4:54 AM  Result Value Ref Range   WBC 4.7 4.0 - 10.5 K/uL   RBC 2.83 (L) 3.87 - 5.11 MIL/uL   Hemoglobin 8.2 (L) 12.0 - 15.0 g/dL   HCT 27.2 (L) 36.0 - 46.0 %   MCV 96.1 80.0 - 100.0 fL   MCH 29.0 26.0 - 34.0 pg   MCHC 30.1 30.0 - 36.0 g/dL   RDW 21.7 (H) 11.5 - 15.5 %   Platelets 106 (L) 150 - 400 K/uL   nRBC 0.0 0.0 - 0.2 %  Glucose, capillary     Status: Abnormal   Collection Time: 05/29/20  8:28 AM  Result Value Ref Range   Glucose-Capillary 112 (H) 70 - 99 mg/dL  TSH     Status: Abnormal   Collection Time: 05/29/20 10:02 AM  Result Value Ref Range   TSH 37.113 (H) 0.350 - 4.500 uIU/mL  T4, free     Status: None   Collection Time: 05/29/20 10:02 AM  Result Value Ref Range   Free T4 0.73 0.61 - 1.12 ng/dL  Glucose, capillary     Status: Abnormal   Collection Time: 05/29/20 11:35 AM  Result Value Ref Range   Glucose-Capillary 108 (H) 70 - 99 mg/dL   *Note: Due to a large number of results and/or encounters for the requested time period, some results have not been displayed. A complete set of results can be found in Results Review.    Recent Results (from the past 240 hour(s))  Resp Panel by RT-PCR (Flu A&B, Covid) Nasopharyngeal Swab     Status: None   Collection Time: 05/28/20  2:06 PM   Specimen: Nasopharyngeal Swab; Nasopharyngeal(NP) swabs in vial transport medium  Result Value Ref Range Status   SARS Coronavirus 2 by RT PCR NEGATIVE NEGATIVE Final    Comment: (NOTE) SARS-CoV-2 target nucleic acids are NOT  DETECTED.  The SARS-CoV-2 RNA is generally detectable in upper respiratory specimens during the acute phase of infection. The lowest concentration of SARS-CoV-2 viral copies this assay  can detect is 138 copies/mL. A negative result does not preclude SARS-Cov-2 infection and should not be used as the sole basis for treatment or other patient management decisions. A negative result may occur with  improper specimen collection/handling, submission of specimen other than nasopharyngeal swab, presence of viral mutation(s) within the areas targeted by this assay, and inadequate number of viral copies(<138 copies/mL). A negative result must be combined with clinical observations, patient history, and epidemiological information. The expected result is Negative.  Fact Sheet for Patients:  EntrepreneurPulse.com.au  Fact Sheet for Healthcare Providers:  IncredibleEmployment.be  This test is no t yet approved or cleared by the [place] FDA and  has been authorized for detection and/or diagnosis of SARS-CoV-2 by FDA under an Emergency Use Authorization (EUA). This EUA will remain  in effect (meaning this test can be used) for the duration of the COVID-19 declaration under Section 564(b)(1) of the Act, 21 U.S.C.section 360bbb-3(b)(1), unless the authorization is terminated  or revoked sooner.       Influenza A by PCR NEGATIVE NEGATIVE Final   Influenza B by PCR NEGATIVE NEGATIVE Final    Comment: (NOTE) The Xpert Xpress SARS-CoV-2/FLU/RSV plus assay is intended as an aid in the diagnosis of influenza from Nasopharyngeal swab specimens and should not be used as a sole basis for treatment. Nasal washings and aspirates are unacceptable for Xpert Xpress SARS-CoV-2/FLU/RSV testing.  Fact Sheet for Patients: EntrepreneurPulse.com.au  Fact Sheet for Healthcare Providers: IncredibleEmployment.be  This test is not yet  approved or cleared by the [place] FDA and has been authorized for detection and/or diagnosis of SARS-CoV-2 by FDA under an Emergency Use Authorization (EUA). This EUA will remain in effect (meaning this test can be used) for the duration of the COVID-19 declaration under Section 564(b)(1) of the Act, 21 U.S.C. section 360bbb-3(b)(1), unless the authorization is terminated or revoked.  Performed at [facility], [place] [address], [place], [place] [address]   MRSA PCR Screening     Status: None   Collection Time: 05/28/20  8:29 PM   Specimen: Nasopharyngeal  Result Value Ref Range Status   MRSA by PCR NEGATIVE NEGATIVE Final    Comment:        The GeneXpert MRSA Assay (FDA approved for NASAL specimens only), is one component of a comprehensive MRSA colonization surveillance program. It is not intended to diagnose MRSA infection nor to guide or monitor treatment for MRSA infections. Performed at [facility], [place] [address] [place], [place] [address]      Radiology Studies: DG Chest Portable 1 View  Result Date: 05/28/2020 CLINICAL DATA:  Shortness of breath EXAM: PORTABLE CHEST 1 VIEW COMPARISON:  May 06, 2020 FINDINGS: The heart size and mediastinal contours are mildly enlarged. A right-sided central venous catheter seen with the tip at the right atrium. Aortic knob calcifications are seen. A left-sided pacemaker seen with the lead tips in the right atrium right ventricle. There is mild prominence of the central pulmonary vasculature. No large airspace consolidation or pleural effusion. No acute osseous abnormality. IMPRESSION: Mild cardiomegaly and pulmonary vascular congestion. Electronically Signed   By: Prudencio Pair M.D.   On: 05/28/2020 14:49   DG Chest Portable 1 View  Final Result      Scheduled Meds: . allopurinol  100 mg Oral BID  . apixaban  5 mg Oral BID  . atorvastatin  10 mg Oral QPM  . Chlorhexidine Gluconate Cloth  6 each Topical Q0600  .  clopidogrel  75 mg Oral Daily  . [START ON 05/30/2020] darbepoetin (ARANESP) injection - DIALYSIS  60 mcg Intravenous Q Fri-HD  . doxycycline  100 mg Oral Q12H  . insulin aspart  0-5 Units Subcutaneous QHS  . insulin aspart  0-9 Units Subcutaneous TID WC  . [START ON 05/30/2020] insulin glargine  10 Units Subcutaneous QHS  . ipratropium-albuterol  3 mL Nebulization Once  . levothyroxine  12.5 mcg Oral Q0600  . levothyroxine  150 mcg Oral Q0600  . loratadine  5 mg Oral QPM  . metoprolol succinate  12.5 mg Oral Daily  . midodrine  5 mg Oral TID WC  . mometasone-formoterol  2 puff Inhalation BID  . pantoprazole  40 mg Oral Daily  . potassium chloride  20 mEq Oral BID  . prenatal multivitamin  1 tablet Oral Q0600  . sodium chloride flush  3 mL Intravenous Q12H  . tobramycin  2 drop Both Eyes Q6H  . venlafaxine XR  37.5 mg Oral Q breakfast   PRN Meds: sodium chloride, acetaminophen **OR** acetaminophen, albuterol, oxyCODONE, polyethylene glycol, sodium chloride flush Continuous Infusions: . sodium chloride       LOS: 1 day  Time spent: Greater than 50% of the 35 minute visit was spent in counseling/coordination of care for the patient as laid out in the A&P.   Dwyane Dee, MD [facility] 05/29/2020, 3:13 PM

## 2020-05-29 NOTE — Assessment & Plan Note (Addendum)
-  Presumed due to volume overload

## 2020-05-29 NOTE — Assessment & Plan Note (Addendum)
-   see above discussions

## 2020-05-29 NOTE — Assessment & Plan Note (Signed)
-   seen by ortho surgery prior to admission; partial dehiscence of right BKA.  Dry gauze and Ace wrap placed.  Staples to remain in place per orthopedic surgery. -Continue 10-day course of doxycycline -Follow-up with orthopedic surgery in 2 weeks

## 2020-05-29 NOTE — Assessment & Plan Note (Addendum)
-   from discharge summary at [facility]: "Patient had extensive history of left femoral embolectomy and 4 compartment fasciotomies with right lower extremity bypass graft done in December 2021. Had been on Plavix and Eliquis which has been withheld because of acute GI bleed. Discussed with GI about anticoagulation, given patient received Eliquis on 04/21/2020 and also Plavix held. Vascular surgery consulted for follow-up. As per discussion with vascular surgery, no emergent surgical intervention needed at this time. Okay to hold off on antiplatelets/anticoagulants at this time but recommended to resume home medications including Plavix and Eliquis once her anemia is addressed." - given that her Hgb seems to be stable in the 8-9 g/dL range, will restart Eliquis again for now; if re-bleeds this likely will be discontinued -Decision for continuing medications left to patient and hospice

## 2020-05-30 ENCOUNTER — Ambulatory Visit: Payer: HMO | Admitting: Medical

## 2020-05-30 DIAGNOSIS — R5381 Other malaise: Secondary | ICD-10-CM

## 2020-05-30 LAB — GLUCOSE, CAPILLARY
Glucose-Capillary: 101 mg/dL — ABNORMAL HIGH (ref 70–99)
Glucose-Capillary: 82 mg/dL (ref 70–99)
Glucose-Capillary: 126 mg/dL — ABNORMAL HIGH (ref 70–99)
Glucose-Capillary: 163 mg/dL — ABNORMAL HIGH (ref 70–99)

## 2020-05-30 LAB — IRON AND TIBC
Iron: 59 ug/dL (ref 28–170)
Saturation Ratios: 23 % (ref 10.4–31.8)
TIBC: 256 ug/dL (ref 250–450)
UIBC: 197 ug/dL

## 2020-05-30 LAB — CBC
HCT: 24.9 % — ABNORMAL LOW (ref 36.0–46.0)
Hemoglobin: 7.8 g/dL — ABNORMAL LOW (ref 12.0–15.0)
MCH: 29.5 pg (ref 26.0–34.0)
MCHC: 31.3 g/dL (ref 30.0–36.0)
MCV: 94.3 fL (ref 80.0–100.0)
Platelets: 115 10*3/uL — ABNORMAL LOW (ref 150–400)
RBC: 2.64 MIL/uL — ABNORMAL LOW (ref 3.87–5.11)
RDW: 21.6 % — ABNORMAL HIGH (ref 11.5–15.5)
WBC: 5.1 10*3/uL (ref 4.0–10.5)
nRBC: 0 % (ref 0.0–0.2)

## 2020-05-30 LAB — RENAL FUNCTION PANEL
Albumin: 2 g/dL — ABNORMAL LOW (ref 3.5–5.0)
Anion gap: 7 (ref 5–15)
BUN: 24 mg/dL — ABNORMAL HIGH (ref 8–23)
CO2: 27 mmol/L (ref 22–32)
Calcium: 8.1 mg/dL — ABNORMAL LOW (ref 8.9–10.3)
Chloride: 93 mmol/L — ABNORMAL LOW (ref 98–111)
Creatinine, Ser: 1.75 mg/dL — ABNORMAL HIGH (ref 0.44–1.00)
GFR, Estimated: 31 mL/min — ABNORMAL LOW (ref >60)
Glucose, Bld: 93 mg/dL (ref 70–99)
Phosphorus: 3.4 mg/dL (ref 2.5–4.6)
Potassium: 3.7 mmol/L (ref 3.5–5.1)
Sodium: 127 mmol/L — ABNORMAL LOW (ref 135–145)

## 2020-05-30 LAB — FERRITIN: Ferritin: 206 ng/mL (ref 11–307)

## 2020-05-30 LAB — HEPATITIS B SURFACE ANTIBODY, QUANTITATIVE: Hep B S AB Quant (Post): <3.1 m[IU]/mL — ABNORMAL LOW (ref >9.9)

## 2020-05-30 LAB — MAGNESIUM: Magnesium: 2 mg/dL (ref 1.7–2.4)

## 2020-05-30 MED ORDER — DARBEPOETIN ALFA 60 MCG/0.3ML IJ SOSY
PREFILLED_SYRINGE | INTRAMUSCULAR | Status: AC
  Administered 2020-05-30: 60 ug via INTRAVENOUS
  Filled 2020-05-30: qty 0.3

## 2020-05-30 MED ORDER — MIDODRINE HCL 5 MG PO TABS
ORAL_TABLET | ORAL | Status: AC
  Administered 2020-05-30: 5 mg via ORAL
  Filled 2020-05-30: qty 1

## 2020-05-30 MED ORDER — HEPARIN SODIUM (PORCINE) 1000 UNIT/ML IJ SOLN
INTRAMUSCULAR | Status: AC
  Administered 2020-05-30: 1000 [IU]
  Filled 2020-05-30: qty 4

## 2020-05-30 NOTE — Progress Notes (Signed)
[facility] Progress Note   Subjective:   No new issues.  Still with phantom limb pain.  Breathing comfortably now.    Objective Vitals:   05/29/20 1630 05/29/20 2105 05/29/20 2119 05/30/20 0414  BP: (!) 128/48 (!) 127/34  (!) 125/58  Pulse: 66 69  (!) 41  Resp: 18 20  18   Temp: 98 F (36.7 C) (!) 97.5 F (36.4 C)  (!) 97.4 F (36.3 C)  TempSrc: Oral Oral  Oral  SpO2: 99% 99% 93% 99%  Weight:      Height:       Physical Exam General: looks much improved - alert, comfortable Heart: irreg irreg, no rub Lungs: no wheezing now, normal WOB,  Much improve Abdomen: soft + abd wall edema Extremities: 2+ pitting edema L, R BKA stump dressing intact Dialysis Access:  [facility] d/c/i  Additional Objective Labs: Basic Metabolic Panel: Recent Labs  Lab 05/28/20 1406 05/28/20 1435 05/29/20 0454 05/30/20 0306  NA 127* 122* 132* 127*  K 5.3* 5.7* 2.9* 3.7  CL 93* 100 97* 93*  CO2 25  --  28 27  GLUCOSE 124* 116* 118* 93  BUN 34* 44* 17 24*  CREATININE 1.96* 1.90* 1.30* 1.75*  CALCIUM 8.4*  --  7.6* 8.1*  PHOS 3.5  --   --  3.4   Liver Function Tests: Recent Labs  Lab 05/28/20 1406 05/30/20 0306  AST 57*  --   ALT 14  --   ALKPHOS 208*  --   BILITOT 1.6*  --   PROT 6.3*  --   ALBUMIN 2.1* 2.0*   No results for input(s): LIPASE, AMYLASE in the last 168 hours. CBC: Recent Labs  Lab 05/28/20 1457 05/29/20 0454 05/30/20 0306  WBC 5.0 4.7 5.1  NEUTROABS 3.3  --   --   HGB 8.5* 8.2* 7.8*  HCT 27.3* 27.2* 24.9*  MCV 95.1 96.1 94.3  PLT PLATELET CLUMPS NOTED ON SMEAR, UNABLE TO ESTIMATE 106* 115*   Blood Culture    Component Value Date/Time   SDES  05/25/2019 1740    LEFT ANTECUBITAL Performed at [facility], [address], [place], [place] [address]    [facility]  05/25/2019 1740    BOTTLES DRAWN AEROBIC AND ANAEROBIC Blood Culture results may not be optimal due to an inadequate volume of blood received in culture  bottles Performed at [facility], [address], [place], [place] [address]    CULT  05/25/2019 1740    NO GROWTH 5 DAYS Performed at [facility], [place] [address] [place], [place] [address]    REPTSTATUS 05/30/2019 FINAL 05/25/2019 1740    Cardiac Enzymes: No results for input(s): CKTOTAL, CKMB, CKMBINDEX, TROPONINI in the last 168 hours. CBG: Recent Labs  Lab 05/29/20 0828 05/29/20 1135 05/29/20 1630 05/29/20 2146 05/30/20 0640  GLUCAP 112* 108* 133* 108* 101*   Iron Studies:  Recent Labs    05/30/20 0306  IRON 59  TIBC 256  FERRITIN 206   @[email or web address] @ Studies/Results: DG Chest Portable 1 View  Result Date: 05/28/2020 CLINICAL DATA:  Shortness of breath EXAM: PORTABLE CHEST 1 VIEW COMPARISON:  May 06, 2020 FINDINGS: The heart size and mediastinal contours are mildly enlarged. A right-sided central venous catheter seen with the tip at the right atrium. Aortic knob calcifications are seen. A left-sided pacemaker seen with the lead tips in the right atrium right ventricle. There is mild prominence of the central pulmonary vasculature.  No large airspace consolidation or pleural effusion. No acute osseous abnormality. IMPRESSION: Mild cardiomegaly and pulmonary vascular congestion. Electronically Signed   By: Prudencio Pair M.D.   On: 05/28/2020 14:49   Medications: . sodium chloride     . allopurinol  100 mg Oral BID  . apixaban  5 mg Oral BID  . atorvastatin  10 mg Oral QPM  . Chlorhexidine Gluconate Cloth  6 each Topical Q0600  . clopidogrel  75 mg Oral Daily  . darbepoetin (ARANESP) injection - DIALYSIS  60 mcg Intravenous Q Fri-HD  . doxycycline  100 mg Oral Q12H  . insulin aspart  0-5 Units Subcutaneous QHS  . insulin aspart  0-9 Units Subcutaneous TID WC  . insulin glargine  10 Units Subcutaneous QHS  . ipratropium-albuterol  3 mL Nebulization Once  . levothyroxine  12.5 mcg Oral Q0600  . levothyroxine  150 mcg Oral Q0600  . loratadine  5 mg  Oral QPM  . metoprolol succinate  12.5 mg Oral Daily  . midodrine  5 mg Oral TID WC  . mometasone-formoterol  2 puff Inhalation BID  . pantoprazole  40 mg Oral Daily  . prenatal multivitamin  1 tablet Oral Q0600  . sodium chloride flush  3 mL Intravenous Q12H  . tobramycin  2 drop Both Eyes Q6H  . venlafaxine XR  37.5 mg Oral Q breakfast      Assessment/Plan **dyspnea, hypoxia: CXR with pulm edema in setting of missed dialysis.  Clinially improved s/p HD 3/30, dial down wt with UF on HD.     **ESRD on HD: [facility] MWF - missed Wed due to inability to transport there. Involved SW given this transportation issue related to her weakness/husband inability to move around will continue. Plan next HD today.  Maximize UF with edema.   Daily weights as able, I/Os, low na/fluid renal diet.   **Anemia: Hb in the 8s, transfused < 7.  Iron ok to hold iron for now given stump infection. Started ESA  **A fib: rate controlled on BB, eliquis.    **hyperkalemia:  Resolved and went low; supplemented.  Use 4K dialysate.  **COPD: diffuse wheezing noted on admission; nebs per primary. Home symbicort.  **Stump cellulitis: doxycycline; no SIRS.    Multiple other medical issues per primary.   Jannifer Hick MD 05/30/2020, 7:49 AM  [facility] Pager: [phone]

## 2020-05-30 NOTE — Plan of Care (Signed)

## 2020-05-30 NOTE — NC FL2 (Signed)
[place] LEVEL OF CARE SCREENING TOOL     IDENTIFICATION  Patient Name: Alexis [name] Birthdate: April 26, 1950 Sex: female Admission Date (Current Location): 05/28/2020  [facility] and [place] Number:  Herbalist and Address:  The . [facility], [place] [address], [place], [place] [address]      Provider Number: [id number]  Attending Physician Name and Address:  Dwyane Dee, MD  Relative Name and Phone Number:  Arlie Posch [phone]    Current Level of Care: Hospital Recommended Level of Care: [place] Prior Approval Number:    Date Approved/Denied:   PASRR Number: [id number]  Discharge Plan: SNF    Current Diagnoses: Patient Active Problem List   Diagnosis Date Noted  . Physical deconditioning 05/29/2020  . COPD (chronic obstructive pulmonary disease) ([place]) 05/29/2020  . CAD (coronary artery disease) 05/29/2020  . S/P BKA (below knee amputation), right ([place]) 05/28/2020  . Pulmonary edema 05/28/2020  . SOB (shortness of breath) 05/28/2020  . Wound infection 05/28/2020  . ESRD on hemodialysis ([place]) 05/28/2020  . Acute respiratory failure with hypoxia ([place])   . Depression 05/26/2019  . Cellulitis 05/26/2019  . Other cirrhosis of liver ([place]) 05/26/2019  . Chronic systolic CHF (congestive heart failure) ([place]) 05/26/2019  . Cellulitis in diabetic foot ([place]) 05/25/2019  . Epigastric pain   . Idiopathic esophageal varices without bleeding ([place])   . Anemia due to chronic blood loss   . Paroxysmal atrial fibrillation ([place]) 09/26/2018  . Macrocytic anemia 09/04/2018  . Pacemaker 08/25/2018  . Insulin dependent diabetes mellitus with complications 97/67/3419  . Constipation 08/25/2018  . Volume overload 08/25/2018  . Chronic anticoagulation 08/25/2018  . Morbid obesity ([place]) 08/25/2018  . Chronic diastolic heart failure ([place]) 05/17/2018  . Acute bronchitis with COPD ([place]) 01/16/2018  . Acquired absence of other  right toe(s) ([place]) 11/16/2017  . Type 2 diabetes mellitus with foot ulcer ([place]) 11/16/2017  . Crush injury of left foot, initial encounter 11/28/2016  . Osteomyelitis ([place]) 07/30/2016  . Gangrene of foot ([place]) 04/27/2016  . Thrombocytopenia ([place])   . Vomiting and diarrhea 04/05/2016  . Hyperlipidemia 11/18/2015  . Gout 08/05/2014  . Cough 08/05/2014  . Allergic rhinitis 07/12/2014  . Osteoarthritis 07/12/2014  . Asthma 07/12/2014  . Congestive heart failure ([place]) 07/12/2014  . Permanent atrial fibrillation ([place]) 07/12/2014  . HTN (hypertension) 07/12/2014  . Hypothyroidism 07/12/2014  . History of substance abuse ([place]) 07/12/2014  . Peripheral vascular disease ([place]) 04/02/2013  . Discoloration of skin 04/02/2013    Orientation RESPIRATION BLADDER Height & Weight     Self,Time,Situation,Place  Normal Incontinent Weight: 97.5 kg Height:  5' 2"  (157.5 cm)  BEHAVIORAL SYMPTOMS/MOOD NEUROLOGICAL BOWEL NUTRITION STATUS      Incontinent Diet  AMBULATORY STATUS COMMUNICATION OF NEEDS Skin   Extensive Assist Verbally Surgical wounds                       Personal Care Assistance Level of Assistance  Bathing,Feeding,Dressing Bathing Assistance: Maximum assistance Feeding assistance: Limited assistance Dressing Assistance: Maximum assistance     Functional Limitations Info  Sight,Hearing,Speech Sight Info: Adequate Hearing Info: Adequate Speech Info: Adequate    SPECIAL CARE FACTORS FREQUENCY  PT (By licensed PT),OT (By licensed OT)     PT Frequency: PT at SNF to eval and treat OT Frequency: OT at SNF to eval and treat            Contractures Contractures Info: Not  present    Additional Factors Info  Code Status,Allergies Code Status Info: DNR Allergies Info: indomethacin, pregabalin, sertraline, sulfa, clindamycin, morphine           Current Medications (05/30/2020):  This is the current hospital active medication list Current Facility-Administered  Medications  Medication Dose Route Frequency Provider Last Rate Last Admin  . 0.9 %  sodium chloride infusion  250 mL Intravenous PRN Roney Jaffe, MD      . acetaminophen (TYLENOL) tablet 650 mg  650 mg Oral Q6H PRN Roney Jaffe, MD   650 mg at 05/29/20 2150   Or  . acetaminophen (TYLENOL) suppository 650 mg  650 mg Rectal Q6H PRN Roney Jaffe, MD      . albuterol (VENTOLIN HFA) 108 (90 Base) MCG/ACT inhaler 2 puff  2 puff Inhalation Q6H PRN Roney Jaffe, MD      . allopurinol (ZYLOPRIM) tablet 100 mg  100 mg Oral BID Roney Jaffe, MD   100 mg at 05/29/20 2148  . apixaban (ELIQUIS) tablet 5 mg  5 mg Oral BID Dwyane Dee, MD   5 mg at 05/29/20 2148  . atorvastatin (LIPITOR) tablet 10 mg  10 mg Oral QPM Roney Jaffe, MD   10 mg at 05/29/20 1755  . Chlorhexidine Gluconate Cloth 2 % PADS 6 each  6 each Topical Q0600 Justin Mend, MD   6 each at 05/30/20 [phone]  . clopidogrel (PLAVIX) tablet 75 mg  75 mg Oral Daily Roney Jaffe, MD   75 mg at 05/29/20 0909  . Darbepoetin Alfa (ARANESP) injection 60 mcg  60 mcg Intravenous Q Fri-HD Justin Mend, MD   60 mcg at 05/30/20 1036  . doxycycline (VIBRA-TABS) tablet 100 mg  100 mg Oral Q12H Roney Jaffe, MD   100 mg at 05/29/20 2148  . insulin aspart (novoLOG) injection 0-5 Units  0-5 Units Subcutaneous QHS Roney Jaffe, MD      . insulin aspart (novoLOG) injection 0-9 Units  0-9 Units Subcutaneous TID WC Roney Jaffe, MD   1 Units at 05/29/20 1757  . insulin glargine (LANTUS) injection 10 Units  10 Units Subcutaneous QHS Dwyane Dee, MD      . ipratropium-albuterol (DUONEB) 0.5-2.5 (3) MG/3ML nebulizer solution 3 mL  3 mL Nebulization Once Long, Wonda Olds, MD      . levothyroxine (SYNTHROID) tablet 12.5 mcg  12.5 mcg Oral Q0600 Roney Jaffe, MD   12.5 mcg at 05/30/20 0520  . levothyroxine (SYNTHROID) tablet 150 mcg  150 mcg Oral Q0600 Roney Jaffe, MD   150 mcg at 05/30/20 0520  . loratadine (CLARITIN) tablet  5 mg  5 mg Oral QPM Roney Jaffe, MD   5 mg at 05/29/20 1754  . metoprolol succinate (TOPROL-XL) 24 hr tablet 12.5 mg  12.5 mg Oral Daily Roney Jaffe, MD   12.5 mg at 05/29/20 0909  . midodrine (PROAMATINE) tablet 5 mg  5 mg Oral TID WC Roney Jaffe, MD   5 mg at 05/30/20 0958  . mometasone-formoterol (DULERA) 200-5 MCG/ACT inhaler 2 puff  2 puff Inhalation BID Roney Jaffe, MD   2 puff at 05/29/20 2119  . oxyCODONE (Oxy IR/ROXICODONE) immediate release tablet 5 mg  5 mg Oral Q3H PRN Dwyane Dee, MD   5 mg at 05/30/20 0521  . pantoprazole (PROTONIX) EC tablet 40 mg  40 mg Oral Daily Roney Jaffe, MD   40 mg at 05/29/20 0908  . polyethylene glycol (MIRALAX / GLYCOLAX) packet 17 g  17 g Oral  Daily PRN Roney Jaffe, MD      . prenatal multivitamin tablet 1 tablet  1 tablet Oral [id number] Roney Jaffe, MD   1 tablet at 05/30/20 0519  . sodium chloride flush (NS) 0.9 % injection 3 mL  3 mL Intravenous Q12H Roney Jaffe, MD   3 mL at 05/29/20 2148  . sodium chloride flush (NS) 0.9 % injection 3 mL  3 mL Intravenous PRN Roney Jaffe, MD      . tobramycin (TOBREX) 0.3 % ophthalmic solution 2 drop  2 drop Both Eyes Q6H Roney Jaffe, MD   2 drop at 05/30/20 0458  . venlafaxine XR (EFFEXOR-XR) 24 hr capsule 37.5 mg  37.5 mg Oral Q breakfast Roney Jaffe, MD   37.5 mg at 05/29/20 0909     Discharge Medications: Please see discharge summary for a list of discharge medications.  Relevant Imaging Results:  Relevant Lab Results:   Additional Information SSN [id number]; HD MWF 11am at [place], RN

## 2020-05-30 NOTE — Progress Notes (Signed)
PROGRESS NOTE    Alexis [name]   DZH:[id number]  DOB: Mar 01, 1951  DOA: 05/28/2020     2  PCP: Alexis [name]  CC: SOB, difficulty getting out of the house  Hospital Course: Alexis [name] is a 70 yo female with PMH PAD, recent R BKA, CAD, HTN, DMII, COPD, PAF (no longer on Eliquis 2/2  recent GIB and unclear if this was to be restarted after Hgb stabilized), ESRD-MWF HD, depression, cirrhosis, obesity, anxiety, osteoarthritis, hypothyroidism who was admitted with worsening pain in her recent R BKA site as well as significant difficulty mobilizing especially forgetting to her most recent dialysis session.  Family has had great difficulty with getting patient out of bed in order to transport her to dialysis.  Due to being unable to go to her session on 05/28/2020, she was instructed to call EMS for patient to be transferred to the hospital.  She has an extensive vascular history and was recently treated at [facility].  Recent discharge summaries have been reviewed from surgery as well as GI from a prior GI bleed.  She was taken off of Eliquis but there is difficulty understanding if this was to be resumed with and/or without her plavix.    PT/OT were consulted on admission given her difficulty with mobility at home.  Patient and family are amenable for placement in rehab if recommended at time of discharge.  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  She was found to have a partially dehisced wound of her BKA.  She was placed in a dry dressing with Ace wrap and stump protector being ordered; also started on 10-day course of doxycycline.  She was instructed to keep staples in place and return for follow-up again in 2 weeks.   Interval History:  No events overnight.  Tentative plan is for placement in rehab to be pursued.  There were some concerns from her insurance company regarding capacity given  contradictory decisions in the past with her goals of care. She was seen after dialysis this morning; breathing has improved and she is less short of breath.  Still edematous which she understands will take several more treatments. Still having ongoing phantom pain.  ROS: Constitutional: positive for fatigue, negative for chills and fevers, Respiratory: negative for cough, Cardiovascular: negative for chest pain and Gastrointestinal: negative for abdominal pain  Assessment & Plan: * SOB (shortness of breath)-resolved as of 05/30/2020 -Presumed due to volume overload.  Continuing on dialysis as noted  Physical deconditioning -Patient having significant difficulty with mobilization at home since surgery and family had great difficulty being able to transport patient to dialysis safely on 05/28/2020.  Patient is amenable for placement in rehab if needed -Follow-up PT/OT recommendations: plan appears to be placement in rehab; Health Team Adv requesting capacity evaluation with psych given prior history of concern for impaired decision making  Wound infection - seen by ortho surgery prior to admission; partial dehiscence of right BKA.  Dry gauze and Ace wrap placed.  Staples to remain in place per orthopedic surgery. -Continue 10-day course of doxycycline -Follow-up with orthopedic surgery in 2 weeks  ESRD on hemodialysis [facility]) -Patient missed outpatient session on 05/28/2020 due to difficulty with mobilization from her recent right BKA -Nephrology following while inpatient.  Patient underwent dialysis on admission on 05/28/2020 and will continue on regular schedule (MWF)  Permanent atrial fibrillation ([place]) - from discharge  summary at [facility]: "Patient had extensive history of left femoral embolectomy and 4 compartment fasciotomies with right lower extremity bypass graft done in December 2021. Had been on Plavix and Eliquis which has been withheld because of acute GI bleed. Discussed with GI about  anticoagulation, given patient received Eliquis on 04/21/2020 and also Plavix held. Vascular surgery consulted for follow-up. As per discussion with vascular surgery, no emergent surgical intervention needed at this time. Okay to hold off on antiplatelets/anticoagulants at this time but recommended to resume home medications including Plavix and Eliquis once her anemia is addressed." - given that her Hgb seems to be stable in the 8-9 g/dL range, will restart Eliquis again for now; if re-bleeds this likely will be discontinued; I will have her follow up with vascular again at discharge   Peripheral vascular disease ([place]) -Prior notes reviewed under care everywhere -Continue Plavix  CAD (coronary artery disease) - not on asa 2/2 GIB hx and also is on plavix/eliquis - continue lipitor, plavix, Toprol; not on ACEi due to hx hyperK  COPD (chronic obstructive pulmonary disease) (HCC) -Continue Symbicort and oxygen  S/P BKA (below knee amputation), right (HCC) - s/p surgery on 05/07/20 - see wound infection   Chronic systolic CHF (congestive heart failure) (HCC) - volume overloaded 2/2 ESRD - continue chronic HD - last echo 11/01/8099: EF 75%, diastolic function unable to be evaluated due to A. fib.  LV global hypokinesis without RWMA  Other cirrhosis of liver (HCC) - asymptomatic; likely NASH - last U/S reviewed from 09/18/19: "Coarse echotexture of the liver consistent with cirrhosis/fatty Infiltration"  Volume overload -Continue on dialysis schedule per nephrology  Hypothyroidism - s/p RAI ablation -Continue home Synthroid  HTN (hypertension) -Continue Toprol and midodrine   Old records reviewed in assessment of this patient  Antimicrobials: Doxycycline  DVT prophylaxis: heparin 5000 UNIT/ML injection Start: 05/29/20 1512 apixaban (ELIQUIS) tablet 5 mg   Code Status:   Code Status: DNR Family Communication: Daughter  Disposition Plan: Status is: Inpatient  Remains  inpatient appropriate because:IV treatments appropriate due to intensity of illness or inability to take PO and Inpatient level of care appropriate due to severity of illness   Dispo: The patient is from: Home              Anticipated d/c is to: SNF              Patient currently is not medically stable to d/c.   Difficult to place patient No   Risk of unplanned readmission score: Unplanned Admission- Pilot do not use: 29.68   Objective: Blood pressure (!) 122/50, pulse (!) 49, temperature 97.8 F (36.6 C), temperature source Oral, resp. rate 16, height 5' 2"  (1.575 m), weight 95 kg, SpO2 100 %.  Examination: General appearance: alert, cooperative and no distress Head: Normocephalic, without obvious abnormality, atraumatic Eyes: EOMI Lungs: coarse breath sounds  Chest: left Perm cath in place Heart: irregularly irregular rhythm and S1, S2 normal Abdomen: obese, soft, NT, ND, BS present Extremities: R BKA noted with dressing in place and ACE wrap. 2-3+ LE edema up to thighs Skin: mobility and turgor normal Neurologic: Grossly normal  Consultants:   Nephrology  Procedures:     Data Reviewed: I have personally reviewed following labs and imaging studies Results for orders placed or performed during the hospital encounter of 05/28/20 (from the past 24 hour(s))  Glucose, capillary     Status: Abnormal   Collection Time: 05/29/20  4:30 PM  Result Value  Ref Range   Glucose-Capillary 133 (H) 70 - 99 mg/dL  Glucose, capillary     Status: Abnormal   Collection Time: 05/29/20  9:46 PM  Result Value Ref Range   Glucose-Capillary 108 (H) 70 - 99 mg/dL  Renal function panel     Status: Abnormal   Collection Time: 05/30/20  3:06 AM  Result Value Ref Range   Sodium 127 (L) 135 - 145 mmol/L   Potassium 3.7 3.5 - 5.1 mmol/L   Chloride 93 (L) 98 - 111 mmol/L   CO2 27 22 - 32 mmol/L   Glucose, Bld 93 70 - 99 mg/dL   BUN 24 (H) 8 - 23 mg/dL   Creatinine, Ser 1.75 (H) 0.44 - 1.00 mg/dL    Calcium 8.1 (L) 8.9 - 10.3 mg/dL   Phosphorus 3.4 2.5 - 4.6 mg/dL   Albumin 2.0 (L) 3.5 - 5.0 g/dL   GFR, Estimated 31 (L) >60 mL/min   Anion gap 7 5 - 15  CBC     Status: Abnormal   Collection Time: 05/30/20  3:06 AM  Result Value Ref Range   WBC 5.1 4.0 - 10.5 K/uL   RBC 2.64 (L) 3.87 - 5.11 MIL/uL   Hemoglobin 7.8 (L) 12.0 - 15.0 g/dL   HCT 24.9 (L) 36.0 - 46.0 %   MCV 94.3 80.0 - 100.0 fL   MCH 29.5 26.0 - 34.0 pg   MCHC 31.3 30.0 - 36.0 g/dL   RDW 21.6 (H) 11.5 - 15.5 %   Platelets 115 (L) 150 - 400 K/uL   nRBC 0.0 0.0 - 0.2 %  Iron and TIBC     Status: None   Collection Time: 05/30/20  3:06 AM  Result Value Ref Range   Iron 59 28 - 170 ug/dL   TIBC 256 250 - 450 ug/dL   Saturation Ratios 23 10.4 - 31.8 %   UIBC 197 ug/dL  Ferritin     Status: None   Collection Time: 05/30/20  3:06 AM  Result Value Ref Range   Ferritin 206 11 - 307 ng/mL  Magnesium     Status: None   Collection Time: 05/30/20  3:06 AM  Result Value Ref Range   Magnesium 2.0 1.7 - 2.4 mg/dL  Glucose, capillary     Status: Abnormal   Collection Time: 05/30/20  6:40 AM  Result Value Ref Range   Glucose-Capillary 101 (H) 70 - 99 mg/dL  Glucose, capillary     Status: None   Collection Time: 05/30/20 12:02 PM  Result Value Ref Range   Glucose-Capillary 82 70 - 99 mg/dL   *Note: Due to a large number of results and/or encounters for the requested time period, some results have not been displayed. A complete set of results can be found in Results Review.    Recent Results (from the past 240 hour(s))  Resp Panel by RT-PCR (Flu A&B, Covid) Nasopharyngeal Swab     Status: None   Collection Time: 05/28/20  2:06 PM   Specimen: Nasopharyngeal Swab; Nasopharyngeal(NP) swabs in vial transport medium  Result Value Ref Range Status   SARS Coronavirus 2 by RT PCR NEGATIVE NEGATIVE Final    Comment: (NOTE) SARS-CoV-2 target nucleic acids are NOT DETECTED.  The SARS-CoV-2 RNA is generally detectable in upper  respiratory specimens during the acute phase of infection. The lowest concentration of SARS-CoV-2 viral copies this assay can detect is 138 copies/mL. A negative result does not preclude SARS-Cov-2 infection and should not be used  as the sole basis for treatment or other patient management decisions. A negative result may occur with  improper specimen collection/handling, submission of specimen other than nasopharyngeal swab, presence of viral mutation(s) within the areas targeted by this assay, and inadequate number of viral copies(<138 copies/mL). A negative result must be combined with clinical observations, patient history, and epidemiological information. The expected result is Negative.  Fact Sheet for Patients:  EntrepreneurPulse.com.au  Fact Sheet for Healthcare Providers:  IncredibleEmployment.be  This test is no t yet approved or cleared by the [place] FDA and  has been authorized for detection and/or diagnosis of SARS-CoV-2 by FDA under an Emergency Use Authorization (EUA). This EUA will remain  in effect (meaning this test can be used) for the duration of the COVID-19 declaration under Section 564(b)(1) of the Act, 21 U.S.C.section 360bbb-3(b)(1), unless the authorization is terminated  or revoked sooner.       Influenza A by PCR NEGATIVE NEGATIVE Final   Influenza B by PCR NEGATIVE NEGATIVE Final    Comment: (NOTE) The Xpert Xpress SARS-CoV-2/FLU/RSV plus assay is intended as an aid in the diagnosis of influenza from Nasopharyngeal swab specimens and should not be used as a sole basis for treatment. Nasal washings and aspirates are unacceptable for Xpert Xpress SARS-CoV-2/FLU/RSV testing.  Fact Sheet for Patients: EntrepreneurPulse.com.au  Fact Sheet for Healthcare Providers: IncredibleEmployment.be  This test is not yet approved or cleared by the [place] FDA and has been  authorized for detection and/or diagnosis of SARS-CoV-2 by FDA under an Emergency Use Authorization (EUA). This EUA will remain in effect (meaning this test can be used) for the duration of the COVID-19 declaration under Section 564(b)(1) of the Act, 21 U.S.C. section 360bbb-3(b)(1), unless the authorization is terminated or revoked.  Performed at [facility], [place] [address] [place], [place] [address]   MRSA PCR Screening     Status: None   Collection Time: 05/28/20  8:29 PM   Specimen: Nasopharyngeal  Result Value Ref Range Status   MRSA by PCR NEGATIVE NEGATIVE Final    Comment:        The GeneXpert MRSA Assay (FDA approved for NASAL specimens only), is one component of a comprehensive MRSA colonization surveillance program. It is not intended to diagnose MRSA infection nor to guide or monitor treatment for MRSA infections. Performed at [facility], [place] [address], [place], [place] [address]      Radiology Studies: No results found. DG Chest Portable 1 View  Final Result      Scheduled Meds: . allopurinol  100 mg Oral BID  . apixaban  5 mg Oral BID  . atorvastatin  10 mg Oral QPM  . Chlorhexidine Gluconate Cloth  6 each Topical Q0600  . clopidogrel  75 mg Oral Daily  . darbepoetin (ARANESP) injection - DIALYSIS  60 mcg Intravenous Q Fri-HD  . doxycycline  100 mg Oral Q12H  . insulin aspart  0-5 Units Subcutaneous QHS  . insulin aspart  0-9 Units Subcutaneous TID WC  . insulin glargine  10 Units Subcutaneous QHS  . ipratropium-albuterol  3 mL Nebulization Once  . levothyroxine  12.5 mcg Oral Q0600  . levothyroxine  150 mcg Oral Q0600  . loratadine  5 mg Oral QPM  . metoprolol succinate  12.5 mg Oral Daily  . midodrine  5 mg Oral TID WC  . mometasone-formoterol  2 puff Inhalation BID  . pantoprazole  40 mg Oral Daily  . prenatal multivitamin  1 tablet Oral Q0600  . sodium chloride flush  3 mL Intravenous Q12H  . tobramycin  2 drop Both Eyes Q6H   . venlafaxine XR  37.5 mg Oral Q breakfast   PRN Meds: sodium chloride, acetaminophen **OR** acetaminophen, albuterol, oxyCODONE, polyethylene glycol, sodium chloride flush Continuous Infusions: . sodium chloride       LOS: 2 days  Time spent: Greater than 50% of the 35 minute visit was spent in counseling/coordination of care for the patient as laid out in the A&P.   Dwyane Dee, MD [facility] 05/30/2020, 4:17 PM

## 2020-05-30 NOTE — TOC Progression Note (Signed)
Transition of Care [facility]) - Progression Note    Patient Details  Name: Alexis [name] MRN: [id number] Date of Birth: 05/21/1950  [facility] CM/SW Contact  Bartholomew Crews, RN Phone Number: [phone] 05/30/2020, 4:39 PM  Clinical Narrative:     Spoke with patient at the bedside to discuss LTC bed offers and barriers that need to be worked through. Discussed bed offer for [facility] - patient does not want to go to [facility]; and [facility] would require permanent access - patient stated that she does not have a permanent acces because she has refused and continues to refuse to have a permanent access placed.   Discussed home with [facility], but patient not ready to commit to stopping dialysis stating that her daughter needs her especially since just losing her father last year.   Tim with [employer] spoke with patient and CM about hospice at home versus hospice at facility. Tim provided contact information for family to follow up.   Patient stated that she wants to go home and be with her family. At this time she wants to continue to have dialysis. Discussed availability of home care resources to assist with her care, and that she needs to not cancel services in place. Patient to talk to her family.   Discussed that going home would be with hospice care and home care services. Patient made aware that home health cannot provide her the same level of care at home.   TOC to follow up with transition needs.   Expected Discharge Plan: Home w Hospice Care Barriers to Discharge: Continued Medical Work up  Expected Discharge Plan and Services Expected Discharge Plan: [place] In-house Referral: Clinical Social Work,Hospice / Palliative Care Discharge Planning Services: CM Consult Post Acute Care Choice: Hospice Living arrangements for the past 2 months: Single Family Home                 DME Arranged: N/A DME Agency: NA       HH  Arranged: NA HH Agency: NA         Social Determinants of Health (SDOH) Interventions    Readmission Risk Interventions No flowsheet data found.

## 2020-05-30 NOTE — Progress Notes (Signed)
New Admission Note:   Arrival Method: via stretcher from ED Mental Orientation: alert and oriented x4 Telemetry: 5M11, CCMD notified Assessment: to be completed Skin: refer to flowsheet IV: RFA, nsl Pain:10/10, will give PRN pain med Tubes: None Safety Measures: Safety Fall Prevention Plan has been discussed  Admission: to be completed 5 [place] Orientation: Patient has been orientated to the room, unit and staff.   Family: none at bedside  Orders to be reviewed and implemented. Will continue to monitor the patient. Call light has been placed within reach and bed alarm has been activated.

## 2020-05-30 NOTE — TOC Progression Note (Signed)
Transition of Care [facility]) - Progression Note    Patient Details  Name: Alexis [name] MRN: [id number] Date of Birth: [birth year]  Transition of Care [facility]) CM/SW Contact  Bartholomew Crews, RN Phone Number: [phone] 05/30/2020, 2:34 PM  Clinical Narrative:     Spoke with DSS Non-Medicaid transportation to verify that patient has dialysis transportation. Discussed if there was an issue with the ramp. Advised that paitent has a ramp; however the end of ramp is on grass and patient must be pushed through grass to the [place], therefore, transportation requires that family push patient out of house and across grass to the [place].   Team meeting with [facility] and [place] today to discuss transition planning. Previous transitions complicated when patient calls up providers who are to assist with care to cancel services. Previous rehab thought that patient had 24/7 care, however, patient did not.   Contacted by [facility] - they can accept patient on dialysis but patient must transition off dialysis within 2 weeks.   FL2 completed for SNF with transition to LTC. Placement complicated by need for LTC and need for hemodialysis. Patient does not have permanent dialysis access in place at this time, which may be required by other dialysis centers if LTC placement cannot offer transport to current dialysis clinic.   Discussed patient competency to make her own decisions - does she understand the consequences of canceling needed services?  TOC following for transition needs.   Expected Discharge Plan: Home w Hospice Care Barriers to Discharge: Continued Medical Work up  Expected Discharge Plan and Services Expected Discharge Plan: [place] In-house Referral: Clinical Social Work,Hospice / Palliative Care Discharge Planning Services: CM Consult Post Acute Care Choice: Hospice Living arrangements for the past 2 months: Single Family Home                 DME Arranged: N/A DME Agency: NA        HH Arranged: NA HH Agency: NA         Social Determinants of Health (SDOH) Interventions    Readmission Risk Interventions No flowsheet data found.

## 2020-05-30 NOTE — TOC Progression Note (Addendum)
Transition of Care [facility]) - Progression Note    Patient Details  Name: Alexis [name] MRN: [id number] Date of Birth: Jul 21, 1950  Transition of Care [facility]) CM/SW Contact  Bartholomew Crews, RN Phone Number: [phone] 05/30/2020, 10:18 AM  Clinical Narrative:     Received call back from patient's spouse, Juanda Crumble. Discussed discharge planning for LTC at a nursing facility vs home with hospice (pending acceptance) and home care services.  Spouse stated that while patient was at previous rehab that they had to pay $2000 for her stay that they didn't have.   Discussed needing a ramp. Charles expressed his frustration with trying to get a ramp. Juanda Crumble was not aware that patient had declined ramp previously or that she had declined set up with home care services. Juanda Crumble stated that he needs help. Juanda Crumble works as a Retail buyer 6 days a week for [employer]. He stated that he works 6a-4:30p M-Th, 6a-2:30p Friday, and 6a-11a on Saturday.   Spouse stated that he is relatively healthy; however, he does have a "bad back." He stated difficulty with using the hoyer lift to move her.   Received follow up call from [facility]. [facility] is no longer active with patient. [facility] stated that patient has had multiple hospitalizations and a rapid decline and no longer safe at home for home health services. Spouse had told [facility] that he cannot care for her at home.   TOC following for transition of care needs.   Expected Discharge Plan: Home w Hospice Care Barriers to Discharge: Continued Medical Work up  Expected Discharge Plan and Services Expected Discharge Plan: [place] In-house Referral: Clinical Social Work,Hospice / Palliative Care Discharge Planning Services: CM Consult Post Acute Care Choice: Hospice Living arrangements for the past 2 months: Single Family Home                 DME Arranged: N/A DME Agency: NA       HH Arranged: NA HH Agency: NA         Social Determinants  of Health (SDOH) Interventions    Readmission Risk Interventions No flowsheet data found.

## 2020-05-31 DIAGNOSIS — R0602 Shortness of breath: Secondary | ICD-10-CM | POA: Diagnosis not present

## 2020-05-31 LAB — CBC WITH DIFFERENTIAL/PLATELET
Abs Immature Granulocytes: 0.03 10*3/uL (ref 0.00–0.07)
Basophils Absolute: 0 10*3/uL (ref 0.0–0.1)
Basophils Relative: 1 %
Eosinophils Absolute: 0.1 10*3/uL (ref 0.0–0.5)
Eosinophils Relative: 3 %
HCT: 27.4 % — ABNORMAL LOW (ref 36.0–46.0)
Hemoglobin: 8.3 g/dL — ABNORMAL LOW (ref 12.0–15.0)
Immature Granulocytes: 1 %
Lymphocytes Relative: 20 %
Lymphs Abs: 1.1 10*3/uL (ref 0.7–4.0)
MCH: 29.1 pg (ref 26.0–34.0)
MCHC: 30.3 g/dL (ref 30.0–36.0)
MCV: 96.1 fL (ref 80.0–100.0)
Monocytes Absolute: 0.7 10*3/uL (ref 0.1–1.0)
Monocytes Relative: 12 %
Neutro Abs: 3.4 10*3/uL (ref 1.7–7.7)
Neutrophils Relative %: 63 %
Platelets: 115 10*3/uL — ABNORMAL LOW (ref 150–400)
RBC: 2.85 MIL/uL — ABNORMAL LOW (ref 3.87–5.11)
RDW: 21.8 % — ABNORMAL HIGH (ref 11.5–15.5)
WBC: 5.4 10*3/uL (ref 4.0–10.5)
nRBC: 0 % (ref 0.0–0.2)

## 2020-05-31 LAB — MAGNESIUM: Magnesium: 1.9 mg/dL (ref 1.7–2.4)

## 2020-05-31 LAB — GLUCOSE, CAPILLARY
Glucose-Capillary: 149 mg/dL — ABNORMAL HIGH (ref 70–99)
Glucose-Capillary: 115 mg/dL — ABNORMAL HIGH (ref 70–99)
Glucose-Capillary: 127 mg/dL — ABNORMAL HIGH (ref 70–99)
Glucose-Capillary: 117 mg/dL — ABNORMAL HIGH (ref 70–99)
Glucose-Capillary: 148 mg/dL — ABNORMAL HIGH (ref 70–99)

## 2020-05-31 LAB — BASIC METABOLIC PANEL
Anion gap: 9 (ref 5–15)
BUN: 18 mg/dL (ref 8–23)
CO2: 23 mmol/L (ref 22–32)
Calcium: 8.3 mg/dL — ABNORMAL LOW (ref 8.9–10.3)
Chloride: 96 mmol/L — ABNORMAL LOW (ref 98–111)
Creatinine, Ser: 1.45 mg/dL — ABNORMAL HIGH (ref 0.44–1.00)
GFR, Estimated: 39 mL/min — ABNORMAL LOW (ref >60)
Glucose, Bld: 134 mg/dL — ABNORMAL HIGH (ref 70–99)
Potassium: 3.7 mmol/L (ref 3.5–5.1)
Sodium: 128 mmol/L — ABNORMAL LOW (ref 135–145)

## 2020-05-31 LAB — PHOSPHORUS: Phosphorus: 2.4 mg/dL — ABNORMAL LOW (ref 2.5–4.6)

## 2020-05-31 MED ORDER — K PHOS MONO-SOD PHOS DI & MONO 155-852-130 MG PO TABS
500.0000 mg | ORAL_TABLET | Freq: Once | ORAL | Status: AC
  Administered 2020-05-31: 500 mg via ORAL
  Filled 2020-05-31: qty 2

## 2020-05-31 MED ORDER — METHOCARBAMOL 500 MG PO TABS
500.0000 mg | ORAL_TABLET | Freq: Three times a day (TID) | ORAL | Status: DC | PRN
  Administered 2020-05-31 – 2020-06-03 (×7): 500 mg via ORAL
  Filled 2020-05-31 (×7): qty 1

## 2020-05-31 NOTE — Plan of Care (Signed)
  Problem: Education: ?Goal: Knowledge of General Education information will improve ?Description: Including pain rating scale, medication(s)/side effects and non-pharmacologic comfort measures ?Outcome: Completed/Met ?  ?Problem: Health Behavior/Discharge Planning: ?Goal: Ability to manage health-related needs will improve ?Outcome: Completed/Met ?  ?Problem: Clinical Measurements: ?Goal: Ability to maintain clinical measurements within normal limits will improve ?Outcome: Completed/Met ?Goal: Will remain free from infection ?Outcome: Completed/Met ?Goal: Diagnostic test results will improve ?Outcome: Completed/Met ?  ?

## 2020-05-31 NOTE — Progress Notes (Signed)
PROGRESS NOTE    Alexis [name]   EUM:[id number]  DOB: [birth year]  DOA: 05/28/2020     3  PCP: Elise Benne  CC: SOB, difficulty getting out of the house  Hospital Course: [name] is a 70 yo female with PMH PAD, recent R BKA, CAD, HTN, DMII, COPD, PAF (no longer on Eliquis 2/2  recent GIB and unclear if this was to be restarted after Hgb stabilized), ESRD-MWF HD, depression, cirrhosis, obesity, anxiety, osteoarthritis, hypothyroidism who was admitted with worsening pain in her recent R BKA site as well as significant difficulty mobilizing especially forgetting to her most recent dialysis session.  Family has had great difficulty with getting patient out of bed in order to transport her to dialysis.  Due to being unable to go to her session on 05/28/2020, she was instructed to call EMS for patient to be transferred to the hospital.  She has an extensive vascular history and was recently treated at [facility].  Recent discharge summaries have been reviewed from surgery as well as GI from a prior GI bleed.  She was taken off of Eliquis but there is difficulty understanding if this was to be resumed with and/or without her plavix.    PT/OT were consulted on admission given her difficulty with mobility at home.  Patient and family are amenable for placement in rehab if recommended at time of discharge.  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  She was found to have a partially dehisced wound of her BKA.  She was placed in a dry dressing with Ace wrap and stump protector being ordered; also started on 10-day course of doxycycline.  She was instructed to keep staples in place and return for follow-up again in 2 weeks.   Interval History:  No events overnight.  Still having phantom pain complaints and muscle spasms (asking for Robaxin). Tells me that she is planning on going home at time of  discharge (possibly Monday).  She is still wanting to continue with dialysis; there have been some discussions about hospice but this is limited if she continues with dialysis.  ROS: Constitutional: positive for fatigue, negative for chills and fevers, Respiratory: negative for cough, Cardiovascular: negative for chest pain and Gastrointestinal: negative for abdominal pain  Assessment & Plan: * SOB (shortness of breath)-resolved as of 05/30/2020 -Presumed due to volume overload.  Continuing on dialysis as noted  Physical deconditioning -Patient having significant difficulty with mobilization at home since surgery and family had great difficulty being able to transport patient to dialysis safely on 05/28/2020.  Patient is amenable for placement in rehab if needed -Follow-up PT/OT recommendations: plan appears to be placement in rehab; Health Team Adv requesting capacity evaluation with psych given prior history of concern for impaired decision making -Disposition planning still being worked on.  Sounds like patient will go home possibly with palliative care or some sort of hospice in place however she is not wishing to discontinue dialysis yet which typically would limit hospice eligibility; possibly home Monday after HD?  Wound infection - seen by ortho surgery prior to admission; partial dehiscence of right BKA.  Dry gauze and Ace wrap placed.  Staples to remain in place per orthopedic surgery. -Continue 10-day course of doxycycline -Follow-up with orthopedic surgery in 2 weeks  ESRD on hemodialysis [facility]) -Patient missed outpatient session on 05/28/2020 due to difficulty with mobilization from her  recent right BKA -Nephrology following while inpatient.  Patient underwent dialysis on admission on 05/28/2020 and will continue on regular schedule (MWF)  Permanent atrial fibrillation ([place]) - from discharge summary at [facility]: "Patient had extensive history of left femoral embolectomy and 4 compartment  fasciotomies with right lower extremity bypass graft done in December 2021. Had been on Plavix and Eliquis which has been withheld because of acute GI bleed. Discussed with GI about anticoagulation, given patient received Eliquis on 04/21/2020 and also Plavix held. Vascular surgery consulted for follow-up. As per discussion with vascular surgery, no emergent surgical intervention needed at this time. Okay to hold off on antiplatelets/anticoagulants at this time but recommended to resume home medications including Plavix and Eliquis once her anemia is addressed." - given that her Hgb seems to be stable in the 8-9 g/dL range, will restart Eliquis again for now; if re-bleeds this likely will be discontinued; I will have her follow up with vascular again at discharge   Peripheral vascular disease ([place]) -Prior notes reviewed under care everywhere -Continue Plavix  CAD (coronary artery disease) - not on asa 2/2 GIB hx and also is on plavix/eliquis - continue lipitor, plavix, Toprol; not on ACEi due to hx hyperK  COPD (chronic obstructive pulmonary disease) (HCC) -Continue Symbicort and oxygen  S/P BKA (below knee amputation), right ([place]) - s/p surgery on 05/07/20 - see wound infection  -Continue pain control.  Robaxin added for muscle spasms  Chronic systolic CHF (congestive heart failure) (HCC) - volume overloaded 2/2 ESRD - continue chronic HD - last echo 4/0/9811: EF 91%, diastolic function unable to be evaluated due to A. fib.  LV global hypokinesis without RWMA  Other cirrhosis of liver (HCC) - asymptomatic; likely NASH - last U/S reviewed from 09/18/19: "Coarse echotexture of the liver consistent with cirrhosis/fatty Infiltration"  Volume overload -Continue on dialysis schedule per nephrology  Hypothyroidism - s/p RAI ablation -Continue home Synthroid  HTN (hypertension) -Continue Toprol and midodrine   Old records reviewed in assessment of this  patient  Antimicrobials: Doxycycline  DVT prophylaxis:  apixaban (ELIQUIS) tablet 5 mg   Code Status:   Code Status: DNR Family Communication: Daughter  Disposition Plan: Status is: Inpatient  Remains inpatient appropriate because:IV treatments appropriate due to intensity of illness or inability to take PO and Inpatient level of care appropriate due to severity of illness   Dispo: The patient is from: Home              Anticipated d/c is to: Home with home health              Patient currently is not medically stable to d/c.   Difficult to place patient No   Risk of unplanned readmission score: Unplanned Admission- Pilot do not use: 27.31   Objective: Blood pressure (!) 124/48, pulse (!) 59, temperature 98.7 F (37.1 C), temperature source Oral, resp. rate 12, height 5' 2"  (1.575 m), weight 104.7 kg, SpO2 94 %.  Examination: General appearance: alert, cooperative and no distress Head: Normocephalic, without obvious abnormality, atraumatic Eyes: EOMI Lungs: coarse breath sounds  Chest: left Perm cath in place Heart: irregularly irregular rhythm and S1, S2 normal Abdomen: obese, soft, NT, ND, BS present Extremities: R BKA noted with dressing in place and ACE wrap. 2-3+ LE edema up to thighs Skin: mobility and turgor normal Neurologic: Grossly normal  Consultants:   Nephrology  Procedures:     Data Reviewed: I have personally reviewed following labs and imaging studies Results  for orders placed or performed during the hospital encounter of 05/28/20 (from the past 24 hour(s))  Glucose, capillary     Status: Abnormal   Collection Time: 05/30/20  5:16 PM  Result Value Ref Range   Glucose-Capillary 126 (H) 70 - 99 mg/dL  Glucose, capillary     Status: Abnormal   Collection Time: 05/30/20  9:11 PM  Result Value Ref Range   Glucose-Capillary 163 (H) 70 - 99 mg/dL  Glucose, capillary     Status: Abnormal   Collection Time: 05/31/20  1:23 AM  Result Value Ref Range    Glucose-Capillary 149 (H) 70 - 99 mg/dL  Magnesium     Status: None   Collection Time: 05/31/20  2:56 AM  Result Value Ref Range   Magnesium 1.9 1.7 - 2.4 mg/dL  Basic metabolic panel     Status: Abnormal   Collection Time: 05/31/20  2:56 AM  Result Value Ref Range   Sodium 128 (L) 135 - 145 mmol/L   Potassium 3.7 3.5 - 5.1 mmol/L   Chloride 96 (L) 98 - 111 mmol/L   CO2 23 22 - 32 mmol/L   Glucose, Bld 134 (H) 70 - 99 mg/dL   BUN 18 8 - 23 mg/dL   Creatinine, Ser 1.45 (H) 0.44 - 1.00 mg/dL   Calcium 8.3 (L) 8.9 - 10.3 mg/dL   GFR, Estimated 39 (L) >60 mL/min   Anion gap 9 5 - 15  CBC with Differential/Platelet     Status: Abnormal   Collection Time: 05/31/20  2:56 AM  Result Value Ref Range   WBC 5.4 4.0 - 10.5 K/uL   RBC 2.85 (L) 3.87 - 5.11 MIL/uL   Hemoglobin 8.3 (L) 12.0 - 15.0 g/dL   HCT 27.4 (L) 36.0 - 46.0 %   MCV 96.1 80.0 - 100.0 fL   MCH 29.1 26.0 - 34.0 pg   MCHC 30.3 30.0 - 36.0 g/dL   RDW 21.8 (H) 11.5 - 15.5 %   Platelets 115 (L) 150 - 400 K/uL   nRBC 0.0 0.0 - 0.2 %   Neutrophils Relative % 63 %   Neutro Abs 3.4 1.7 - 7.7 K/uL   Lymphocytes Relative 20 %   Lymphs Abs 1.1 0.7 - 4.0 K/uL   Monocytes Relative 12 %   Monocytes Absolute 0.7 0.1 - 1.0 K/uL   Eosinophils Relative 3 %   Eosinophils Absolute 0.1 0.0 - 0.5 K/uL   Basophils Relative 1 %   Basophils Absolute 0.0 0.0 - 0.1 K/uL   Immature Granulocytes 1 %   Abs Immature Granulocytes 0.03 0.00 - 0.07 K/uL  Phosphorus     Status: Abnormal   Collection Time: 05/31/20  2:56 AM  Result Value Ref Range   Phosphorus 2.4 (L) 2.5 - 4.6 mg/dL  Glucose, capillary     Status: Abnormal   Collection Time: 05/31/20  6:02 AM  Result Value Ref Range   Glucose-Capillary 115 (H) 70 - 99 mg/dL  Glucose, capillary     Status: Abnormal   Collection Time: 05/31/20 11:47 AM  Result Value Ref Range   Glucose-Capillary 127 (H) 70 - 99 mg/dL   *Note: Due to a large number of results and/or encounters for the  requested time period, some results have not been displayed. A complete set of results can be found in Results Review.    Recent Results (from the past 240 hour(s))  Resp Panel by RT-PCR (Flu A&B, Covid) Nasopharyngeal Swab     Status:  None   Collection Time: 05/28/20  2:06 PM   Specimen: Nasopharyngeal Swab; Nasopharyngeal(NP) swabs in vial transport medium  Result Value Ref Range Status   SARS Coronavirus 2 by RT PCR NEGATIVE NEGATIVE Final    Comment: (NOTE) SARS-CoV-2 target nucleic acids are NOT DETECTED.  The SARS-CoV-2 RNA is generally detectable in upper respiratory specimens during the acute phase of infection. The lowest concentration of SARS-CoV-2 viral copies this assay can detect is 138 copies/mL. A negative result does not preclude SARS-Cov-2 infection and should not be used as the sole basis for treatment or other patient management decisions. A negative result may occur with  improper specimen collection/handling, submission of specimen other than nasopharyngeal swab, presence of viral mutation(s) within the areas targeted by this assay, and inadequate number of viral copies(<138 copies/mL). A negative result must be combined with clinical observations, patient history, and epidemiological information. The expected result is Negative.  Fact Sheet for Patients:  EntrepreneurPulse.com.au  Fact Sheet for Healthcare Providers:  IncredibleEmployment.be  This test is no t yet approved or cleared by the [place] FDA and  has been authorized for detection and/or diagnosis of SARS-CoV-2 by FDA under an Emergency Use Authorization (EUA). This EUA will remain  in effect (meaning this test can be used) for the duration of the COVID-19 declaration under Section 564(b)(1) of the Act, 21 U.S.C.section 360bbb-3(b)(1), unless the authorization is terminated  or revoked sooner.       Influenza A by PCR NEGATIVE NEGATIVE Final   Influenza  B by PCR NEGATIVE NEGATIVE Final    Comment: (NOTE) The Xpert Xpress SARS-CoV-2/FLU/RSV plus assay is intended as an aid in the diagnosis of influenza from Nasopharyngeal swab specimens and should not be used as a sole basis for treatment. Nasal washings and aspirates are unacceptable for Xpert Xpress SARS-CoV-2/FLU/RSV testing.  Fact Sheet for Patients: EntrepreneurPulse.com.au  Fact Sheet for Healthcare Providers: IncredibleEmployment.be  This test is not yet approved or cleared by the [place] FDA and has been authorized for detection and/or diagnosis of SARS-CoV-2 by FDA under an Emergency Use Authorization (EUA). This EUA will remain in effect (meaning this test can be used) for the duration of the COVID-19 declaration under Section 564(b)(1) of the Act, 21 U.S.C. section 360bbb-3(b)(1), unless the authorization is terminated or revoked.  Performed at [facility], [place] [address] [place], [place] [address]   MRSA PCR Screening     Status: None   Collection Time: 05/28/20  8:29 PM   Specimen: Nasopharyngeal  Result Value Ref Range Status   MRSA by PCR NEGATIVE NEGATIVE Final    Comment:        The GeneXpert MRSA Assay (FDA approved for NASAL specimens only), is one component of a comprehensive MRSA colonization surveillance program. It is not intended to diagnose MRSA infection nor to guide or monitor treatment for MRSA infections. Performed at [facility], [place] [address], [place], [place] [address]      Radiology Studies: No results found. DG Chest Portable 1 View  Final Result      Scheduled Meds: . allopurinol  100 mg Oral BID  . apixaban  5 mg Oral BID  . atorvastatin  10 mg Oral QPM  . Chlorhexidine Gluconate Cloth  6 each Topical Q0600  . clopidogrel  75 mg Oral Daily  . darbepoetin (ARANESP) injection - DIALYSIS  60 mcg Intravenous Q Fri-HD  . doxycycline  100 mg Oral Q12H  . insulin aspart  0-5 Units Subcutaneous QHS  . insulin aspart  0-9 Units Subcutaneous TID WC  . insulin glargine  10 Units Subcutaneous QHS  . ipratropium-albuterol  3 mL Nebulization Once  . levothyroxine  12.5 mcg Oral Q0600  . levothyroxine  150 mcg Oral Q0600  . loratadine  5 mg Oral QPM  . metoprolol succinate  12.5 mg Oral Daily  . midodrine  5 mg Oral TID WC  . mometasone-formoterol  2 puff Inhalation BID  . pantoprazole  40 mg Oral Daily  . prenatal multivitamin  1 tablet Oral Q0600  . sodium chloride flush  3 mL Intravenous Q12H  . tobramycin  2 drop Both Eyes Q6H  . venlafaxine XR  37.5 mg Oral Q breakfast   PRN Meds: sodium chloride, acetaminophen **OR** acetaminophen, albuterol, methocarbamol, oxyCODONE, polyethylene glycol, sodium chloride flush Continuous Infusions: . sodium chloride       LOS: 3 days  Time spent: Greater than 50% of the 35 minute visit was spent in counseling/coordination of care for the patient as laid out in the A&P.   Dwyane Dee, MD [facility] 05/31/2020, 12:38 PM

## 2020-05-31 NOTE — Progress Notes (Signed)
[name] Progress Note   Subjective:   No new issues.  Still with phantom limb pain.  Breathing comfortably now.  She tells me she's going home with hospice.    Objective Vitals:   05/31/20 0500 05/31/20 0848 05/31/20 0900 05/31/20 1030  BP:   (!) 124/47 (!) 124/48  Pulse:   (!) 50 (!) 59  Resp:   18 12  Temp:   98.7 F (37.1 C)   TempSrc:   Oral   SpO2:  94%    Weight: 104.7 kg     Height:       Physical Exam General: looks much improved - drowsy, comfortable Heart: irreg irreg, no rub Lungs: no wheezing now, normal WOB,  Much improved Abdomen: soft + abd wall edema Extremities: 2+ pitting edema L, R BKA stump dressing intact Dialysis Access:  [facility] d/c/i  Additional Objective Labs: Basic Metabolic Panel: Recent Labs  Lab 05/28/20 1406 05/28/20 1435 05/29/20 0454 05/30/20 0306 05/31/20 0256  NA 127*   < > 132* 127* 128*  K 5.3*   < > 2.9* 3.7 3.7  CL 93*   < > 97* 93* 96*  CO2 25  --  28 27 23   GLUCOSE 124*   < > 118* 93 134*  BUN 34*   < > 17 24* 18  CREATININE 1.96*   < > 1.30* 1.75* 1.45*  CALCIUM 8.4*  --  7.6* 8.1* 8.3*  PHOS 3.5  --   --  3.4 2.4*   < > = values in this interval not displayed.   Liver Function Tests: Recent Labs  Lab 05/28/20 1406 05/30/20 0306  AST 57*  --   ALT 14  --   ALKPHOS 208*  --   BILITOT 1.6*  --   PROT 6.3*  --   ALBUMIN 2.1* 2.0*   No results for input(s): LIPASE, AMYLASE in the last 168 hours. CBC: Recent Labs  Lab 05/28/20 1457 05/29/20 0454 05/30/20 0306 05/31/20 0256  WBC 5.0 4.7 5.1 5.4  NEUTROABS 3.3  --   --  3.4  HGB 8.5* 8.2* 7.8* 8.3*  HCT 27.3* 27.2* 24.9* 27.4*  MCV 95.1 96.1 94.3 96.1  PLT PLATELET CLUMPS NOTED ON SMEAR, UNABLE TO ESTIMATE 106* 115* 115*   Blood Culture    Component Value Date/Time   SDES  05/25/2019 1740    LEFT ANTECUBITAL Performed at [facility], [address] [place], [place] [address]    [facility]  05/25/2019 1740    BOTTLES  DRAWN AEROBIC AND ANAEROBIC Blood Culture results may not be optimal due to an inadequate volume of blood received in culture bottles Performed at [facility], [address], [place], [place] [address]    CULT  05/25/2019 1740    NO GROWTH 5 DAYS Performed at [facility], [place] [address] [place], [place] [address]    REPTSTATUS 05/30/2019 FINAL 05/25/2019 1740    Cardiac Enzymes: No results for input(s): CKTOTAL, CKMB, CKMBINDEX, TROPONINI in the last 168 hours. CBG: Recent Labs  Lab 05/30/20 1202 05/30/20 1716 05/30/20 2111 05/31/20 0123 05/31/20 0602  GLUCAP 82 126* 163* 149* 115*   Iron Studies:  Recent Labs    05/30/20 0306  IRON 59  TIBC 256  FERRITIN 206   @[email or web address] @ Studies/Results: No results found. Medications: . sodium chloride     . allopurinol  100 mg Oral BID  . apixaban  5 mg Oral BID  .  atorvastatin  10 mg Oral QPM  . Chlorhexidine Gluconate Cloth  6 each Topical Q0600  . clopidogrel  75 mg Oral Daily  . darbepoetin (ARANESP) injection - DIALYSIS  60 mcg Intravenous Q Fri-HD  . doxycycline  100 mg Oral Q12H  . insulin aspart  0-5 Units Subcutaneous QHS  . insulin aspart  0-9 Units Subcutaneous TID WC  . insulin glargine  10 Units Subcutaneous QHS  . ipratropium-albuterol  3 mL Nebulization Once  . levothyroxine  12.5 mcg Oral Q0600  . levothyroxine  150 mcg Oral Q0600  . loratadine  5 mg Oral QPM  . metoprolol succinate  12.5 mg Oral Daily  . midodrine  5 mg Oral TID WC  . mometasone-formoterol  2 puff Inhalation BID  . pantoprazole  40 mg Oral Daily  . prenatal multivitamin  1 tablet Oral Q0600  . sodium chloride flush  3 mL Intravenous Q12H  . tobramycin  2 drop Both Eyes Q6H  . venlafaxine XR  37.5 mg Oral Q breakfast      Assessment/Plan **dyspnea, hypoxia: CXR with pulm edema in setting of missed dialysis.  Clinially improved s/p HD 3/30, dial down wt with UF on HD.     **ESRD on HD: [facility] MWF -  missed Wed due to inability to transport there. Involved SW given this transportation issue related to her weakness/husband inability to move around will continue. Had HD Friday 2.9L UF- next Mon.  Maximize UF with edema.   Daily weights as able, I/Os, low na/fluid renal diet.   **Anemia: Hb in the 8s, transfused < 7.  Iron ok to hold iron for now given stump infection. Started ESA  **A fib: rate controlled on BB, eliquis.    **COPD: diffuse wheezing noted on admission; nebs per primary. Home symbicort.  **Stump cellulitis: doxycycline; no SIRS.    Multiple other medical issues per primary.   Dispo - SNF vs hospice - she wishes to continue dialysis for now so would not be hospice eligible  Jannifer Hick MD 05/31/2020, 10:39 AM  [facility] Pager: [phone]

## 2020-05-31 NOTE — Consult Note (Signed)
[facility] Consult   Reason for Consult:'' please assess capacity for medical decision making; insurance company requesting given contradictory decisions in past regarding H/H, care at home.'' Referring Physician:  Dwyane Dee, MD Patient Identification: Alexis [name] MRN:  [id number] Principal Diagnosis: SOB (shortness of breath) Diagnosis:  Active Problems:   Peripheral vascular disease ([place])   Permanent atrial fibrillation (HCC)   HTN (hypertension)   Hypothyroidism   Volume overload   Other cirrhosis of liver (HCC)   Chronic systolic CHF (congestive heart failure) (HCC)   S/P BKA (below knee amputation), right (HCC)   Wound infection   ESRD on hemodialysis ([place])   Physical deconditioning   COPD (chronic obstructive pulmonary disease) ([place])   CAD (coronary artery disease)   Total Time spent with patient: 45 minutes  Subjective:   Alexis [name] is a 70 y.o. female patient admitted due to SOB, difficulty getting out of home.  HPI: Patient is a pleasant 70 yo female with PMH PAD, recent R BKA, CAD, HTN, DMII, COPD, PAF, ESRD-MWF HD, depression, cirrhosis, obesity, anxiety, osteoarthritis, hypothyroidism who was admitted with worsening pain in her recent R BKA. Psychiatric was consulted to assess patient's capacity for medical decision making. Today, patient is cooperative, alert, awake, oriented to time, place and person. She reports that she is aware of the reason for her hospitalization and has been very cooperative with her treatment. She denies drugs/alcohol use, psychosis, delusions, depression, anxiety, memory impairment all of which may interfere with her decision making ability if present. Patient score 28/30 on mini-mental status examination.  She reports that she was living with her family prior hospitalization and will be happy to accept any decision to help with her medical issues.   Past Psychiatric History: as above  Risk to Self:  denies Risk  to Others:  denies Prior Inpatient Therapy:  none for psychiatric issues Prior Outpatient Therapy:    Past Medical History:  Past Medical History:  Diagnosis Date  . Allergy   . Anxiety   . Arthritis   . Asthma   . Atrial fibrillation ([place])   . CHF (congestive heart failure) ([place])   . Cirrhosis ([place])   . COPD (chronic obstructive pulmonary disease) ([place])   . Depression 05/26/2019  . Diabetes mellitus without complication ([place])   . ESRD (end stage renal disease) on dialysis ([place])   . ESRD on hemodialysis ([place]) 05/28/2020  . Hyperlipidemia   . Hypertension   . Macrocytic anemia 09/04/2018  . Myocardial infarction ([place])    several  . Pacemaker    CRT-P with RV lead and His Bundle lead ( high threshold)   . Peripheral neuropathy   . Pneumonia   . PONV (postoperative nausea and vomiting)    unsure of exactly what happened at [facility] in 2010 (knee surgery) that led to crital care  . Thyroid disease     Past Surgical History:  Procedure Laterality Date  . ABDOMINAL AORTOGRAM W/LOWER EXTREMITY N/A 04/29/2016   Procedure: Abdominal Aortogram w/Lower Extremity;  Surgeon: Waynetta Sandy, MD;  Location: [facility];  Service: Cardiovascular;  Laterality: N/A;  . ABDOMINAL AORTOGRAM W/LOWER EXTREMITY N/A 05/06/2017   Procedure: ABDOMINAL AORTOGRAM W/LOWER EXTREMITY;  Surgeon: Angelia Mould, MD;  Location: [facility];  Service: Cardiovascular;  Laterality: N/A;  bilateral  . ABDOMINAL AORTOGRAM W/LOWER EXTREMITY Bilateral 05/04/2019   Procedure: ABDOMINAL AORTOGRAM W/LOWER EXTREMITY;  Surgeon: Angelia Mould, MD;  Location: [facility];  Service: Cardiovascular;  Laterality: Bilateral;  . ABDOMINAL AORTOGRAM W/LOWER EXTREMITY Left 08/27/2019   Procedure: ABDOMINAL AORTOGRAM W/LOWER EXTREMITY;  Surgeon: Waynetta Sandy, MD;  Location: [facility];  Service: Cardiovascular;  Laterality: Left;  . ABDOMINAL HYSTERECTOMY    .  AMPUTATION Right 07/30/2016   Procedure: AMPUTATION RIGHT SECOND TOE;  Surgeon: Angelia Mould, MD;  Location: [facility];  Service: Vascular;  Laterality: Right;  . CARDIAC CATHETERIZATION     2005 at [facility] N/A 12/25/2018   Procedure: COLONOSCOPY WITH PROPOFOL;  Surgeon: Jackquline Denmark, MD;  Location: [facility];  Service: Endoscopy;  Laterality: N/A;  . ESOPHAGEAL BANDING  12/25/2018   Procedure: ESOPHAGEAL BANDING;  Surgeon: Jackquline Denmark, MD;  Location: [facility];  Service: Endoscopy;;  . ESOPHAGEAL BANDING  05/01/2019   Procedure: ESOPHAGEAL BANDING;  Surgeon: Jackquline Denmark, MD;  Location: [facility];  Service: Endoscopy;;  . ESOPHAGOGASTRODUODENOSCOPY (EGD) WITH PROPOFOL N/A 12/25/2018   Procedure: ESOPHAGOGASTRODUODENOSCOPY (EGD) WITH PROPOFOL;  Surgeon: Jackquline Denmark, MD;  [facility];  Service: Endoscopy;  Laterality: N/A;  . ESOPHAGOGASTRODUODENOSCOPY (EGD) WITH PROPOFOL N/A 05/01/2019   Procedure: ESOPHAGOGASTRODUODENOSCOPY (EGD) WITH PROPOFOL;  Surgeon: Jackquline Denmark, MD;  [facility];  Service: Endoscopy;  Laterality: N/A;  . LOWER EXTREMITY ANGIOGRAPHY Right 05/14/2019   Procedure: LOWER EXTREMITY ANGIOGRAPHY;  Surgeon: Waynetta Sandy, MD;  Location: [facility];  Service: Cardiovascular;  Laterality: Right;  . PERIPHERAL VASCULAR ATHERECTOMY Right 04/29/2016   Procedure: Peripheral Vascular Atherectomy-Right Popliteal;  Surgeon: Waynetta Sandy, MD;  Location: [facility];  Service: Cardiovascular;  Laterality: Right;  . PERIPHERAL VASCULAR ATHERECTOMY Right 05/14/2019   Procedure: PERIPHERAL VASCULAR ATHERECTOMY;  Surgeon: Waynetta Sandy, MD;  Location: [facility];  Service: Cardiovascular;  Laterality: Right;  right SFA/pop  . PERIPHERAL VASCULAR BALLOON ANGIOPLASTY Left 08/27/2019   Procedure: PERIPHERAL VASCULAR BALLOON ANGIOPLASTY;  Surgeon: Waynetta Sandy, MD;  Location: [facility];  Service: Cardiovascular;  Laterality: Left;  popliteal  . PERIPHERAL VASCULAR INTERVENTION Right 04/29/2016   Procedure: Peripheral Vascular Intervention-Right Popliteal;  Surgeon: Waynetta Sandy, MD;  Location: [facility];  Service: Cardiovascular;  Laterality: Right;  POPLITEAL PTA  . PERIPHERAL VASCULAR INTERVENTION Right 05/14/2019   Procedure: PERIPHERAL VASCULAR INTERVENTION;  Surgeon: Waynetta Sandy, MD;  Location: [facility];  Service: Cardiovascular;  Laterality: Right;  popliteal stent  . RADIOFREQUENCY ABLATION    . TOTAL KNEE ARTHROPLASTY    . TUBAL LIGATION     Family History:  Family History  Problem Relation Age of Onset  . Diabetes Mother   . Hyperlipidemia Mother   . Diabetes Father   . Hyperlipidemia Father   . Heart disease Father        before age 35  . Heart attack Father   . Diabetes Brother   . Hyperlipidemia Brother   . Heart attack Brother    Family Psychiatric  History:  Social History:  Social History   Substance and Sexual Activity  Alcohol Use No  . 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     Social History   Substance and Sexual Activity  Drug Use Not Currently  . Types: Marijuana   Comment: remote use    Social History   Socioeconomic History  . Marital status: Married    Spouse name: Juanda Crumble  .  Number of children: 1  . Years of education: Not on file  . Highest education level: Not on file  Occupational History  . 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 Smoking status: Former Smoker    Types: Cigarettes    Quit date: 03/01/1998    Years since quitting: 22.2  . Smokeless tobacco: Never Used  Vaping Use  . Vaping Use: Never used  Substance and Sexual Activity  . Alcohol use: No    Alcohol/week: 0.0 standard drinks    Comment: Previous hx:  recovering alcoholic.  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  . Drug use: Not Currently    Types: Marijuana    Comment: remote use  . Sexual activity: Never  Other Topics Concern  . 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 Difficulty of Paying Living Expenses: Not very hard  Food Insecurity: No Food Insecurity  . Worried About Charity fundraiser in the Last Year: Never true  . Ran Out of Food in the Last Year: Never true  Transportation Needs: No Transportation Needs  . Lack of Transportation (Medical): No  . Lack of Transportation (Non-Medical): No  Physical Activity: Not on file  Stress: Not on file  Social Connections: Not on file   Additional Social History:    Allergies:   Allergies  Allergen Reactions  . Indomethacin Other (See Comments)    Renal Insufficiency  . Pregabalin Other (See Comments)    DIZZINESS   . Sertraline Nausea And Vomiting  . Sulfa Antibiotics Hives  . Clindamycin/Lincomycin   . Morphine And Related Nausea And Vomiting    Labs:  Results for orders placed or performed during the hospital encounter of 05/28/20 (from the past 48 hour(s))  Glucose, capillary     Status: Abnormal   Collection Time: 05/29/20  4:30 PM  Result Value Ref Range   Glucose-Capillary 133 (H) 70 - 99 mg/dL    Comment: Glucose reference range applies only to samples taken after fasting for at least 8 hours.  Glucose, capillary     Status: Abnormal   Collection Time: 05/29/20  9:46 PM  Result Value Ref Range   Glucose-Capillary 108 (H) 70 - 99 mg/dL    Comment: Glucose reference range applies only to samples taken after fasting for at least 8 hours.  Renal function panel     Status:  Abnormal   Collection Time: 05/30/20  3:06 AM  Result Value Ref Range   Sodium 127 (L) 135 - 145 mmol/L   Potassium 3.7 3.5 - 5.1 mmol/L   Chloride 93 (L) 98 - 111 mmol/L   CO2 27 22 - 32 mmol/L   Glucose, Bld 93 70 - 99 mg/dL    Comment: Glucose reference range applies only to samples taken after fasting for at least 8 hours.   BUN 24 (H) 8 - 23 mg/dL   Creatinine, Ser 1.75 (H) 0.44 - 1.00 mg/dL   Calcium 8.1 (L) 8.9 - 10.3 mg/dL   Phosphorus 3.4 2.5 - 4.6 mg/dL   Albumin 2.0 (L) 3.5 - 5.0 g/dL   GFR, Estimated 31 (  L) >60 mL/min    Comment: (NOTE) Calculated using the CKD-EPI Creatinine Equation (2021)    Anion gap 7 5 - 15    Comment: Performed at [facility], [place] [address], [place], [place] [address]  CBC     Status: Abnormal   Collection Time: 05/30/20  3:06 AM  Result Value Ref Range   WBC 5.1 4.0 - 10.5 K/uL   RBC 2.64 (L) 3.87 - 5.11 MIL/uL   Hemoglobin 7.8 (L) 12.0 - 15.0 g/dL   HCT 24.9 (L) 36.0 - 46.0 %   MCV 94.3 80.0 - 100.0 fL   MCH 29.5 26.0 - 34.0 pg   MCHC 31.3 30.0 - 36.0 g/dL   RDW 21.6 (H) 11.5 - 15.5 %   Platelets 115 (L) 150 - 400 K/uL    Comment: Immature Platelet Fraction may be clinically indicated, consider ordering this additional test [id number] CONSISTENT WITH PREVIOUS RESULT REPEATED TO VERIFY    nRBC 0.0 0.0 - 0.2 %    Comment: Performed at [facility], [place] [address], [place], [place] [address]  Iron and TIBC     Status: None   Collection Time: 05/30/20  3:06 AM  Result Value Ref Range   Iron 59 28 - 170 ug/dL   TIBC 256 250 - 450 ug/dL   Saturation Ratios 23 10.4 - 31.8 %   UIBC 197 ug/dL    Comment: Performed at [facility], [place] [address], [place], [place] [address]  Ferritin     Status: None   Collection Time: 05/30/20  3:06 AM  Result Value Ref Range   Ferritin 206 11 - 307 ng/mL    Comment: Performed at [facility], [place] [address], [place], [place] [address]  Magnesium     Status: None   Collection  Time: 05/30/20  3:06 AM  Result Value Ref Range   Magnesium 2.0 1.7 - 2.4 mg/dL    Comment: Performed at [facility] [address] [place], [place] [address]  Glucose, capillary     Status: Abnormal   Collection Time: 05/30/20  6:40 AM  Result Value Ref Range   Glucose-Capillary 101 (H) 70 - 99 mg/dL    Comment: Glucose reference range applies only to samples taken after fasting for at least 8 hours.  Glucose, capillary     Status: None   Collection Time: 05/30/20 12:02 PM  Result Value Ref Range   Glucose-Capillary 82 70 - 99 mg/dL    Comment: Glucose reference range applies only to samples taken after fasting for at least 8 hours.  Glucose, capillary     Status: Abnormal   Collection Time: 05/30/20  5:16 PM  Result Value Ref Range   Glucose-Capillary 126 (H) 70 - 99 mg/dL    Comment: Glucose reference range applies only to samples taken after fasting for at least 8 hours.  Glucose, capillary     Status: Abnormal   Collection Time: 05/30/20  9:11 PM  Result Value Ref Range   Glucose-Capillary 163 (H) 70 - 99 mg/dL    Comment: Glucose reference range applies only to samples taken after fasting for at least 8 hours.  Glucose, capillary     Status: Abnormal   Collection Time: 05/31/20  1:23 AM  Result Value Ref Range   Glucose-Capillary 149 (H) 70 - 99 mg/dL    Comment: Glucose reference range applies only to samples taken after fasting for at least 8 hours.  Magnesium     Status: None   Collection Time: 05/31/20  2:56 AM  Result Value Ref Range   Magnesium 1.9 1.7 - 2.4 mg/dL    Comment: Performed at [facility], [place] [address] [place], [place] [address]  Basic metabolic panel     Status: Abnormal   Collection Time: 05/31/20  2:56 AM  Result Value Ref Range   Sodium 128 (L) 135 - 145 mmol/L   Potassium 3.7 3.5 - 5.1 mmol/L   Chloride 96 (L) 98 - 111 mmol/L   CO2 23 22 - 32 mmol/L   Glucose, Bld 134 (H) 70 - 99 mg/dL    Comment: Glucose reference range  applies only to samples taken after fasting for at least 8 hours.   BUN 18 8 - 23 mg/dL   Creatinine, Ser 1.45 (H) 0.44 - 1.00 mg/dL   Calcium 8.3 (L) 8.9 - 10.3 mg/dL   GFR, Estimated 39 (L) >60 mL/min    Comment: (NOTE) Calculated using the CKD-EPI Creatinine Equation (2021)    Anion gap 9 5 - 15    Comment: Performed at [facility] [address] [place], [place] [address]  CBC with Differential/Platelet     Status: Abnormal   Collection Time: 05/31/20  2:56 AM  Result Value Ref Range   WBC 5.4 4.0 - 10.5 K/uL   RBC 2.85 (L) 3.87 - 5.11 MIL/uL   Hemoglobin 8.3 (L) 12.0 - 15.0 g/dL   HCT 27.4 (L) 36.0 - 46.0 %   MCV 96.1 80.0 - 100.0 fL   MCH 29.1 26.0 - 34.0 pg   MCHC 30.3 30.0 - 36.0 g/dL   RDW 21.8 (H) 11.5 - 15.5 %   Platelets 115 (L) 150 - 400 K/uL    Comment: Immature Platelet Fraction may be clinically indicated, consider ordering this additional test [id number] CONSISTENT WITH PREVIOUS RESULT REPEATED TO VERIFY    nRBC 0.0 0.0 - 0.2 %   Neutrophils Relative % 63 %   Neutro Abs 3.4 1.7 - 7.7 K/uL   Lymphocytes Relative 20 %   Lymphs Abs 1.1 0.7 - 4.0 K/uL   Monocytes Relative 12 %   Monocytes Absolute 0.7 0.1 - 1.0 K/uL   Eosinophils Relative 3 %   Eosinophils Absolute 0.1 0.0 - 0.5 K/uL   Basophils Relative 1 %   Basophils Absolute 0.0 0.0 - 0.1 K/uL   Immature Granulocytes 1 %   Abs Immature Granulocytes 0.03 0.00 - 0.07 K/uL    Comment: Performed at [facility], [place] [address], [place], [place] [address]  Phosphorus     Status: Abnormal   Collection Time: 05/31/20  2:56 AM  Result Value Ref Range   Phosphorus 2.4 (L) 2.5 - 4.6 mg/dL    Comment: Performed at [facility] [address], [place], [place] [address]  Glucose, capillary     Status: Abnormal   Collection Time: 05/31/20  6:02 AM  Result Value Ref Range   Glucose-Capillary 115 (H) 70 - 99 mg/dL    Comment: Glucose reference range applies only to samples taken after fasting  for at least 8 hours.  Glucose, capillary     Status: Abnormal   Collection Time: 05/31/20 11:47 AM  Result Value Ref Range   Glucose-Capillary 127 (H) 70 - 99 mg/dL    Comment: Glucose reference range applies only to samples taken after fasting for at least 8 hours.   *Note: Due to a  large number of results and/or encounters for the requested time period, some results have not been displayed. A complete set of results can be found in Results Review.    Current Facility-Administered Medications  Medication Dose Route Frequency Provider Last Rate Last Admin  . 0.9 %  sodium chloride infusion  250 mL Intravenous PRN Roney Jaffe, MD      . acetaminophen (TYLENOL) tablet 650 mg  650 mg Oral Q6H PRN Roney Jaffe, MD   650 mg at 05/29/20 2150   Or  . acetaminophen (TYLENOL) suppository 650 mg  650 mg Rectal Q6H PRN Roney Jaffe, MD      . albuterol (VENTOLIN HFA) 108 (90 Base) MCG/ACT inhaler 2 puff  2 puff Inhalation Q6H PRN Roney Jaffe, MD      . allopurinol (ZYLOPRIM) tablet 100 mg  100 mg Oral BID Roney Jaffe, MD   100 mg at 05/31/20 0900  . apixaban (ELIQUIS) tablet 5 mg  5 mg Oral BID Dwyane Dee, MD   5 mg at 05/31/20 0900  . atorvastatin (LIPITOR) tablet 10 mg  10 mg Oral QPM Roney Jaffe, MD   10 mg at 05/30/20 1711  . Chlorhexidine Gluconate Cloth 2 % PADS 6 each  6 each Topical Q0600 Justin Mend, MD   6 each at 05/31/20 [phone]  . clopidogrel (PLAVIX) tablet 75 mg  75 mg Oral Daily Roney Jaffe, MD   75 mg at 05/31/20 0900  . Darbepoetin Alfa (ARANESP) injection 60 mcg  60 mcg Intravenous Q Fri-HD Justin Mend, MD   60 mcg at 05/30/20 1036  . doxycycline (VIBRA-TABS) tablet 100 mg  100 mg Oral Q12H Roney Jaffe, MD   100 mg at 05/31/20 0900  . insulin aspart (novoLOG) injection 0-5 Units  0-5 Units Subcutaneous QHS Roney Jaffe, MD      . insulin aspart (novoLOG) injection 0-9 Units  0-9 Units Subcutaneous TID WC Roney Jaffe, MD   1 Units at  05/31/20 1200  . insulin glargine (LANTUS) injection 10 Units  10 Units Subcutaneous QHS Dwyane Dee, MD   10 Units at 05/30/20 2157  . ipratropium-albuterol (DUONEB) 0.5-2.5 (3) MG/3ML nebulizer solution 3 mL  3 mL Nebulization Once Long, Wonda Olds, MD      . levothyroxine (SYNTHROID) tablet 12.5 mcg  12.5 mcg Oral Q0600 Roney Jaffe, MD   12.5 mcg at 05/31/20 [phone]  . levothyroxine (SYNTHROID) tablet 150 mcg  150 mcg Oral Q0600 Roney Jaffe, MD   150 mcg at 05/31/20 0612  . loratadine (CLARITIN) tablet 5 mg  5 mg Oral QPM Roney Jaffe, MD   5 mg at 05/30/20 1711  . methocarbamol (ROBAXIN) tablet 500 mg  500 mg Oral Q8H PRN Dwyane Dee, MD   500 mg at 05/31/20 1000  . metoprolol succinate (TOPROL-XL) 24 hr tablet 12.5 mg  12.5 mg Oral Daily Roney Jaffe, MD   12.5 mg at 05/30/20 1124  . midodrine (PROAMATINE) tablet 5 mg  5 mg Oral TID WC Roney Jaffe, MD   5 mg at 05/31/20 1200  . mometasone-formoterol (DULERA) 200-5 MCG/ACT inhaler 2 puff  2 puff Inhalation BID Roney Jaffe, MD   2 puff at 05/31/20 0848  . oxyCODONE (Oxy IR/ROXICODONE) immediate release tablet 5 mg  5 mg Oral Q3H PRN Dwyane Dee, MD   5 mg at 05/31/20 1104  . pantoprazole (PROTONIX) EC tablet 40 mg  40 mg Oral Daily Roney Jaffe, MD   40 mg at 05/31/20 0900  .  polyethylene glycol (MIRALAX / GLYCOLAX) packet 17 g  17 g Oral Daily PRN Roney Jaffe, MD      . prenatal multivitamin tablet 1 tablet  1 tablet Oral [id number] Roney Jaffe, MD   1 tablet at 05/31/20 0612  . sodium chloride flush (NS) 0.9 % injection 3 mL  3 mL Intravenous Q12H Roney Jaffe, MD   3 mL at 05/31/20 0900  . sodium chloride flush (NS) 0.9 % injection 3 mL  3 mL Intravenous PRN Roney Jaffe, MD      . tobramycin (TOBREX) 0.3 % ophthalmic solution 2 drop  2 drop Both Eyes Q6H Roney Jaffe, MD   2 drop at 05/31/20 0900  . venlafaxine XR (EFFEXOR-XR) 24 hr capsule 37.5 mg  37.5 mg Oral Q breakfast Roney Jaffe, MD   37.5  mg at 05/31/20 0900    Musculoskeletal: Strength & Muscle Tone: not tested Gait & Station: unable to stand Patient leans: N/A   Psychiatric Specialty Exam:  Presentation  General Appearance: Appropriate for Environment  Eye Contact:Good  Speech:Clear and Coherent  Speech Volume:Normal  Handedness:Right   Mood and Affect  Mood:Euthymic  Affect:Appropriate   Thought Process  Thought Processes:Coherent; Goal Directed; Linear  Descriptions of Associations:Intact  Orientation:Full (Time, Place and Person)  Thought Content:Logical  History of Schizophrenia/Schizoaffective disorder:No data recorded Duration of Psychotic Symptoms:No data recorded Hallucinations:Hallucinations: None  Ideas of Reference:None  Suicidal Thoughts:Suicidal Thoughts: No  Homicidal Thoughts:Homicidal Thoughts: No   Sensorium  Memory:Immediate Good; Recent Good; Remote Good  Judgment:Good  Insight:Good   Executive Functions  Concentration:Good  Attention Span:Good  [place] Key of Knowledge:Good  Language:Good   Psychomotor Activity  Psychomotor Activity:Psychomotor Activity: Normal   Assets  Assets:Communication Skills; Desire for Improvement   Sleep  Sleep:Sleep: Good   Physical Exam: Physical Exam ROS Blood pressure (!) 124/48, pulse (!) 59, temperature 98.7 F (37.1 C), temperature source Oral, resp. rate 12, height 5' 2"  (1.575 m), weight 104.7 kg, SpO2 94 %. Body mass index is 42.22 kg/m.  Treatment Plan Summary:   70 yo female with PMH PAD, recent R BKA, CAD, HTN, DMII, COPD, PAF, ESRD-MWF HD, depression, cirrhosis, obesity, anxiety, osteoarthritis, hypothyroidism who was admitted with worsening pain in her recent R BKA. Today, she is alert, oriented, cooperative, denies drugs/alcohol use, psychosis, delusions, depression, anxiety, memory impairment all of which may interfere with her decision making ability if present. She also score 28/30 on  mini-mental status examination which means means there is not likely any current evidence of memory impairment. Based on my evaluation today, patient has capacity to make informed medical decision. However, Capacity is just a functional assessment that can change from time to time. A more objective assessment is competency which is determined by a judge.  Disposition: No evidence of imminent risk to self or others at present.   Supportive therapy provided about ongoing stressors. Psychiatric service signing off. Re-consult as needed  Corena Pilgrim, MD 05/31/2020 1:44 PM

## 2020-05-31 NOTE — Evaluation (Signed)
Occupational Therapy Evaluation Patient Details Name: Alexis [name] MRN: [id number] DOB: 1950-05-30 Today's Date: 05/31/2020    History of Present Illness Pt is a 70 y/o female admitted secondary to increased SOB on 3/30. Likely secondary to volume overload. Pt with recent R BKA that was done at [facility]. Jerome includes ESRD on HD, COPD, dCHF, and s/p R BKA.   Clinical Impression   Patient admitted for the diagnosis above.  PTA she had just complete 2 weeks of rehab at a SNF, needing hoyer lift for transfers, and up to max A for lower body ADL.  Pain to her R leg, and now weakness to her L leg are barriers.  Currently, she is needing up to Max A for basic bed mobility, and remains up to Max A for LB ADL.  OT will follow in the acute setting to maximize her functional status, but additional SNF rehab should be attempted.  The patient reports she was in bed the majority of the time at home.  She shows potential to at least slide board to a wheelchair for improved quality of life.      Follow Up Recommendations  SNF    Equipment Recommendations  None recommended by OT    Recommendations for Other Services       Precautions / Restrictions Precautions Precautions: Fall Precaution Comments: R BKA      Mobility Bed Mobility Overal bed mobility: Needs Assistance Bed Mobility: Supine to Sit;Sit to Supine     Supine to sit: Mod assist Sit to supine: Max assist        Transfers                 General transfer comment: deferred. patient unable to clear bottom off the bed.  recliner in room does not have removable arm    Balance Overall balance assessment: Needs assistance Sitting-balance support: Bilateral upper extremity supported;Feet supported Sitting balance-Leahy Scale: Fair                                     ADL either performed or assessed with clinical judgement   ADL Overall ADL's : Needs assistance/impaired Eating/Feeding: Set up;Bed  level   Grooming: Wash/dry hands;Wash/dry face;Set up;Bed level           Upper Body Dressing : Moderate assistance;Sitting   Lower Body Dressing: Total assistance;Bed level                       Vision Baseline Vision/History: Wears glasses Patient Visual Report: No change from baseline       Perception     Praxis      Pertinent Vitals/Pain Pain Assessment: Faces Faces Pain Scale: Hurts whole lot Pain Location: R residual limb Pain Descriptors / Indicators: Aching;Operative site guarding Pain Intervention(s): Monitored during session     Hand Dominance Right   Extremity/Trunk Assessment Upper Extremity Assessment Upper Extremity Assessment: Generalized weakness   Lower Extremity Assessment Lower Extremity Assessment: Defer to PT evaluation   Cervical / Trunk Assessment Cervical / Trunk Assessment: Kyphotic   Communication Communication Communication: No difficulties   Cognition Arousal/Alertness: Awake/alert Behavior During Therapy: WFL for tasks assessed/performed Overall Cognitive Status: Impaired/Different from baseline Area of Impairment: Memory;Following commands;Problem solving                     Memory: Decreased short-term memory Following Commands:  Follows one step commands consistently     Problem Solving: Slow processing;Requires verbal cues General Comments: Patient states medications are making her "loopy".                    Home Living Family/patient expects to be discharged to:: Private residence Living Arrangements: Spouse/significant other Available Help at Discharge: Family Type of Home: House Home Access: [place]: One level     Bathroom Shower/Tub: Teacher, early years/pre: Handicapped height Bathroom Accessibility: No   Home Equipment: Environmental consultant - 2 wheels;Bedside commode;Wheelchair - manual;Hospital bed;Other (comment)   Additional Comments: Hoyer left       Prior Functioning/Environment Level of Independence: Needs assistance  Gait / Transfers Assistance Needed: Pt reports she has been having to use hoyer lift after d/c home with SNF, but has mostly been in the bed. ADL's / Homemaking Assistance Needed: Pt reports husband will put "diaper" on her when he is gone during the day. Has had sponge baths.            OT Problem List: Decreased strength;Decreased range of motion;Decreased activity tolerance;Impaired balance (sitting and/or standing);Decreased cognition;Pain      OT Treatment/Interventions: Self-care/ADL training;Therapeutic exercise;Energy conservation;DME and/or AE instruction;Cognitive remediation/compensation;Therapeutic activities;Balance training    OT Goals(Current goals can be found in the care plan section) ADL Goals Pt Will Perform Grooming: with set-up;sitting Pt Will Perform Upper Body Bathing: with set-up;sitting Pt Will Perform Upper Body Dressing: with set-up;sitting Pt Will Transfer to Toilet: with transfer board;bedside commode;with min assist  OT Frequency: Min 2X/week   Barriers to D/C:    none noted       Co-evaluation              AM-PAC OT "6 Clicks" Daily Activity     Outcome Measure Help from another person eating meals?: None Help from another person taking care of personal grooming?: None Help from another person toileting, which includes using toliet, bedpan, or urinal?: A Lot Help from another person bathing (including washing, rinsing, drying)?: A Lot Help from another person to put on and taking off regular upper body clothing?: A Lot Help from another person to put on and taking off regular lower body clothing?: Total 6 Click Score: 15   End of Session Nurse Communication: Mobility status  Activity Tolerance: Patient tolerated treatment well Patient left: in bed;with call bell/phone within reach;with bed alarm set  OT Visit Diagnosis: Muscle weakness (generalized)  (M62.81);Unsteadiness on feet (R26.81);Other symptoms and signs involving cognitive function;Pain Pain - Right/Left: Right Pain - part of body: Leg                Time: 1140-1205 OT Time Calculation (min): 25 min Charges:  OT General Charges $OT Visit: 1 Visit OT Evaluation $OT Eval Moderate Complexity: 1 Mod OT Treatments $Self Care/Home Management : 8-22 mins  05/31/2020  Rich, OTR/L  Acute Rehabilitation Services  Office:  [place] 05/31/2020, 12:13 PM

## 2020-06-01 LAB — BASIC METABOLIC PANEL
Anion gap: 7 (ref 5–15)
BUN: 22 mg/dL (ref 8–23)
CO2: 25 mmol/L (ref 22–32)
Calcium: 8.4 mg/dL — ABNORMAL LOW (ref 8.9–10.3)
Chloride: 94 mmol/L — ABNORMAL LOW (ref 98–111)
Creatinine, Ser: 1.65 mg/dL — ABNORMAL HIGH (ref 0.44–1.00)
GFR, Estimated: 33 mL/min — ABNORMAL LOW (ref >60)
Glucose, Bld: 117 mg/dL — ABNORMAL HIGH (ref 70–99)
Potassium: 3.7 mmol/L (ref 3.5–5.1)
Sodium: 126 mmol/L — ABNORMAL LOW (ref 135–145)

## 2020-06-01 LAB — CBC WITH DIFFERENTIAL/PLATELET
Abs Immature Granulocytes: 0.01 10*3/uL (ref 0.00–0.07)
Basophils Absolute: 0 10*3/uL (ref 0.0–0.1)
Basophils Relative: 1 %
Eosinophils Absolute: 0.1 10*3/uL (ref 0.0–0.5)
Eosinophils Relative: 2 %
HCT: 27 % — ABNORMAL LOW (ref 36.0–46.0)
Hemoglobin: 8.4 g/dL — ABNORMAL LOW (ref 12.0–15.0)
Immature Granulocytes: 0 %
Lymphocytes Relative: 24 %
Lymphs Abs: 1.1 10*3/uL (ref 0.7–4.0)
MCH: 29.6 pg (ref 26.0–34.0)
MCHC: 31.1 g/dL (ref 30.0–36.0)
MCV: 95.1 fL (ref 80.0–100.0)
Monocytes Absolute: 0.5 10*3/uL (ref 0.1–1.0)
Monocytes Relative: 10 %
Neutro Abs: 3 10*3/uL (ref 1.7–7.7)
Neutrophils Relative %: 63 %
Platelets: 103 10*3/uL — ABNORMAL LOW (ref 150–400)
RBC: 2.84 MIL/uL — ABNORMAL LOW (ref 3.87–5.11)
RDW: 21.6 % — ABNORMAL HIGH (ref 11.5–15.5)
WBC: 4.8 10*3/uL (ref 4.0–10.5)
nRBC: 0 % (ref 0.0–0.2)

## 2020-06-01 LAB — URINALYSIS, ROUTINE W REFLEX MICROSCOPIC
Bilirubin Urine: NEGATIVE
Glucose, UA: NEGATIVE mg/dL
Ketones, ur: 5 mg/dL — AB
Nitrite: NEGATIVE
Protein, ur: 30 mg/dL — AB
Specific Gravity, Urine: 1.013 (ref 1.005–1.030)
WBC, UA: >50 WBC/hpf — ABNORMAL HIGH (ref 0–5)
pH: 5 (ref 5.0–8.0)

## 2020-06-01 LAB — PHOSPHORUS: Phosphorus: 3.2 mg/dL (ref 2.5–4.6)

## 2020-06-01 LAB — GLUCOSE, CAPILLARY
Glucose-Capillary: 180 mg/dL — ABNORMAL HIGH (ref 70–99)
Glucose-Capillary: 130 mg/dL — ABNORMAL HIGH (ref 70–99)
Glucose-Capillary: 190 mg/dL — ABNORMAL HIGH (ref 70–99)
Glucose-Capillary: 137 mg/dL — ABNORMAL HIGH (ref 70–99)

## 2020-06-01 LAB — MAGNESIUM: Magnesium: 1.8 mg/dL (ref 1.7–2.4)

## 2020-06-01 NOTE — Progress Notes (Signed)
[facility] Progress Note   Subjective:   No new issues.  Still with phantom limb pain.  Breathing comfortably now.  She tells me she's going home with hospice but plans to continue HD x 2 weeks.  Husband bedside this AM.    Objective Vitals:   05/31/20 2023 05/31/20 2100 06/01/20 0525 06/01/20 0854  BP: (!) 125/34  115/73 (!) 127/40  Pulse: (!) 52  (!) 44 (!) 59  Resp: 20  20 18   Temp: 97.8 F (36.6 C)  97.9 F (36.6 C) 98 F (36.7 C)  TempSrc: Oral  Oral   SpO2: 97% 97% 97% 96%  Weight:      Height:       Physical Exam General: looks much improved - drowsy, comfortable Heart: irreg irreg, no rub Lungs: no wheezing now, normal WOB,  Much improved Abdomen: soft + abd wall edema Extremities: 2+ pitting edema L, R BKA stump dressing intact Dialysis Access:  [facility] d/c/i  Additional Objective Labs: Basic Metabolic Panel: Recent Labs  Lab 05/30/20 0306 05/31/20 0256 06/01/20 0419  NA 127* 128* 126*  K 3.7 3.7 3.7  CL 93* 96* 94*  CO2 27 23 25   GLUCOSE 93 134* 117*  BUN 24* 18 22  CREATININE 1.75* 1.45* 1.65*  CALCIUM 8.1* 8.3* 8.4*  PHOS 3.4 2.4* 3.2   Liver Function Tests: Recent Labs  Lab 05/28/20 1406 05/30/20 0306  AST 57*  --   ALT 14  --   ALKPHOS 208*  --   BILITOT 1.6*  --   PROT 6.3*  --   ALBUMIN 2.1* 2.0*   No results for input(s): LIPASE, AMYLASE in the last 168 hours. CBC: Recent Labs  Lab 05/28/20 1457 05/29/20 0454 05/30/20 0306 05/31/20 0256 06/01/20 0419  WBC 5.0 4.7 5.1 5.4 4.8  NEUTROABS 3.3  --   --  3.4 3.0  HGB 8.5* 8.2* 7.8* 8.3* 8.4*  HCT 27.3* 27.2* 24.9* 27.4* 27.0*  MCV 95.1 96.1 94.3 96.1 95.1  PLT PLATELET CLUMPS NOTED ON SMEAR, UNABLE TO ESTIMATE 106* 115* 115* 103*   Blood Culture    Component Value Date/Time   SDES  05/25/2019 1740    LEFT ANTECUBITAL Performed at [facility], [address] [place], [place] [address]    [facility]  05/25/2019 1740    BOTTLES DRAWN AEROBIC AND  ANAEROBIC Blood Culture results may not be optimal due to an inadequate volume of blood received in culture bottles Performed at [facility], [address] [place], [place] [address]    CULT  05/25/2019 1740    NO GROWTH 5 DAYS Performed at [facility], [place] [address], [place], [place] [address]    REPTSTATUS 05/30/2019 FINAL 05/25/2019 1740    Cardiac Enzymes: No results for input(s): CKTOTAL, CKMB, CKMBINDEX, TROPONINI in the last 168 hours. CBG: Recent Labs  Lab 05/31/20 0602 05/31/20 1147 05/31/20 1732 05/31/20 2036 06/01/20 0643  GLUCAP 115* 127* 117* 148* 180*   Iron Studies:  Recent Labs    05/30/20 0306  IRON 59  TIBC 256  FERRITIN 206   @[email or web address] @ Studies/Results: No results found. Medications: . sodium chloride     . allopurinol  100 mg Oral BID  . apixaban  5 mg Oral BID  . atorvastatin  10 mg Oral QPM  . Chlorhexidine Gluconate Cloth  6 each Topical Q0600  . clopidogrel  75 mg Oral Daily  . darbepoetin (ARANESP) injection -  DIALYSIS  60 mcg Intravenous Q Fri-HD  . doxycycline  100 mg Oral Q12H  . insulin aspart  0-5 Units Subcutaneous QHS  . insulin aspart  0-9 Units Subcutaneous TID WC  . insulin glargine  10 Units Subcutaneous QHS  . ipratropium-albuterol  3 mL Nebulization Once  . levothyroxine  12.5 mcg Oral Q0600  . levothyroxine  150 mcg Oral Q0600  . loratadine  5 mg Oral QPM  . metoprolol succinate  12.5 mg Oral Daily  . midodrine  5 mg Oral TID WC  . mometasone-formoterol  2 puff Inhalation BID  . pantoprazole  40 mg Oral Daily  . prenatal multivitamin  1 tablet Oral Q0600  . sodium chloride flush  3 mL Intravenous Q12H  . tobramycin  2 drop Both Eyes Q6H  . venlafaxine XR  37.5 mg Oral Q breakfast      Assessment/Plan **dyspnea, hypoxia: CXR with pulm edema in setting of missed dialysis.  Clinially improved s/p HD 3/30, dial down wt with UF on HD.     **ESRD on HD: [facility] MWF - missed Wed due to  inability to transport there. Involved SW given this transportation issue related to her weakness/husband inability to move around will continue and now I'm hearing she is going to do hospice but continued HD x 2 weeks; d/w hospitalist -- clarifying exact dispo but sounds like her insurance will authorize this. Had HD Friday 2.9L UF- next Mon.  Maximize UF with edema.   Daily weights as able, I/Os, low na/fluid renal diet.    **Anemia: Hb in the 8s, transfused < 7.  Iron ok to hold iron for now given stump infection. Started ESA  **A fib: rate controlled on BB, eliquis.    **COPD: diffuse wheezing noted on admission; nebs per primary. Home symbicort.  **Stump cellulitis: doxycycline; no SIRS.    Multiple other medical issues per primary.   Jannifer Hick MD 06/01/2020, 11:17 AM  [facility] Pager: [phone]

## 2020-06-01 NOTE — Progress Notes (Signed)
PROGRESS NOTE    Alexis [name]   WUJ:[id number]  DOB: [birth year]  DOA: 05/28/2020     4  PCP: Elise Benne  CC: SOB, difficulty getting out of the house  Hospital Course: Alexis [name] is a 70 yo female with PMH PAD, recent R BKA, CAD, HTN, DMII, COPD, PAF (no longer on Eliquis 2/2  recent GIB and unclear if this was to be restarted after Hgb stabilized), ESRD-MWF HD, depression, cirrhosis, obesity, anxiety, osteoarthritis, hypothyroidism who was admitted with worsening pain in her recent R BKA site as well as significant difficulty mobilizing especially forgetting to her most recent dialysis session.  Family has had great difficulty with getting patient out of bed in order to transport her to dialysis.  Due to being unable to go to her session on 05/28/2020, she was instructed to call EMS for patient to be transferred to the hospital.  She has an extensive vascular history and was recently treated at [facility].  Recent discharge summaries have been reviewed from surgery as well as GI from a prior GI bleed.  She was taken off of Eliquis but there is difficulty understanding if this was to be resumed with and/or without her plavix.    PT/OT were consulted on admission given her difficulty with mobility at home.  Patient and family are amenable for placement in rehab if recommended at time of discharge.  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  She was found to have a partially dehisced wound of her BKA.  She was placed in a dry dressing with Ace wrap and stump protector being ordered; also started on 10-day course of doxycycline.  She was instructed to keep staples in place and return for follow-up again in 2 weeks.   Interval History:  Still having phantom pain and some spasms. She is planning to go home Monday after HD. Says she will be able to get from a vehicle into her house. She has  a lift at home.   ROS: Constitutional: positive for fatigue, negative for chills and fevers, Respiratory: negative for cough, Cardiovascular: negative for chest pain and Gastrointestinal: negative for abdominal pain  Assessment & Plan: * SOB (shortness of breath)-resolved as of 05/30/2020 -Presumed due to volume overload.  Continuing on dialysis as noted  Physical deconditioning -Patient having significant difficulty with mobilization at home since surgery and family had great difficulty being able to transport patient to dialysis safely on 05/28/2020.  Patient is amenable for placement in rehab if needed -Follow-up PT/OT recommendations: plan appears to be placement in rehab; Health Team Adv requesting capacity evaluation with psych given prior history of concern for impaired decision making -Disposition planning still being worked on.  Sounds like patient will go home possibly with palliative care or some sort of hospice in place however she is not wishing to discontinue dialysis yet which typically would limit hospice eligibility; possibly home Monday after HD?  Wound infection - seen by ortho surgery prior to admission; partial dehiscence of right BKA.  Dry gauze and Ace wrap placed.  Staples to remain in place per orthopedic surgery. -Continue 10-day course of doxycycline -Follow-up with orthopedic surgery in 2 weeks  ESRD on hemodialysis [facility]) -Patient missed outpatient session on 05/28/2020 due to difficulty with mobilization from her recent right BKA -Nephrology following while inpatient.  Patient underwent dialysis on admission on 05/28/2020 and will continue on  regular schedule (MWF)  Permanent atrial fibrillation ([place]) - from discharge summary at [facility]: "Patient had extensive history of left femoral embolectomy and 4 compartment fasciotomies with right lower extremity bypass graft done in December 2021. Had been on Plavix and Eliquis which has been withheld because of acute GI bleed.  Discussed with GI about anticoagulation, given patient received Eliquis on 04/21/2020 and also Plavix held. Vascular surgery consulted for follow-up. As per discussion with vascular surgery, no emergent surgical intervention needed at this time. Okay to hold off on antiplatelets/anticoagulants at this time but recommended to resume home medications including Plavix and Eliquis once her anemia is addressed." - given that her Hgb seems to be stable in the 8-9 g/dL range, will restart Eliquis again for now; if re-bleeds this likely will be discontinued; I will have her follow up with vascular again at discharge   Peripheral vascular disease ([place]) -Prior notes reviewed under care everywhere -Continue Plavix  CAD (coronary artery disease) - not on asa 2/2 GIB hx and also is on plavix/eliquis - continue lipitor, plavix, Toprol; not on ACEi due to hx hyperK  COPD (chronic obstructive pulmonary disease) (HCC) -Continue Symbicort and oxygen  S/P BKA (below knee amputation), right ([place]) - s/p surgery on 05/07/20 - see wound infection  -Continue pain control.  Robaxin added for muscle spasms  Chronic systolic CHF (congestive heart failure) (HCC) - volume overloaded 2/2 ESRD - continue chronic HD - last echo 0/07/43: EF 99%, diastolic function unable to be evaluated due to A. fib.  LV global hypokinesis without RWMA  Other cirrhosis of liver (HCC) - asymptomatic; likely NASH - last U/S reviewed from 09/18/19: "Coarse echotexture of the liver consistent with cirrhosis/fatty Infiltration"  Volume overload -Continue on dialysis schedule per nephrology  Hypothyroidism - s/p RAI ablation -Continue home Synthroid  HTN (hypertension) -Continue Toprol and midodrine   Old records reviewed in assessment of this patient  Antimicrobials: Doxycycline  DVT prophylaxis:  apixaban (ELIQUIS) tablet 5 mg   Code Status:   Code Status: DNR Family Communication: Alexis [name]  Disposition Plan: Status  is: Inpatient  Remains inpatient appropriate because:IV treatments appropriate due to intensity of illness or inability to take PO and Inpatient level of care appropriate due to severity of illness   Dispo: The patient is from: Home              Anticipated d/c is to: Home with home health              Patient currently is not medically stable to d/c.   Difficult to place patient No   Risk of unplanned readmission score: Unplanned Admission- Pilot do not use: 27.66   Objective: Blood pressure (!) 127/40, pulse (!) 59, temperature 98 F (36.7 C), resp. rate 18, height 5' 2"  (1.575 m), weight 97.6 kg, SpO2 96 %.  Examination: General appearance: alert, cooperative and no distress Head: Normocephalic, without obvious abnormality, atraumatic Eyes: EOMI Lungs: coarse breath sounds  Chest: left Perm cath in place Heart: irregularly irregular rhythm and S1, S2 normal Abdomen: obese, soft, NT, ND, BS present Extremities: R BKA noted with dressing in place and ACE wrap. 2-3+ LE edema up to thighs Skin: mobility and turgor normal Neurologic: Grossly normal  Consultants:   Nephrology  Procedures:     Data Reviewed: I have personally reviewed following labs and imaging studies Results for orders placed or performed during the hospital encounter of 05/28/20 (from the past 24 hour(s))  Glucose, capillary  Status: Abnormal   Collection Time: 05/31/20  5:32 PM  Result Value Ref Range   Glucose-Capillary 117 (H) 70 - 99 mg/dL  Glucose, capillary     Status: Abnormal   Collection Time: 05/31/20  8:36 PM  Result Value Ref Range   Glucose-Capillary 148 (H) 70 - 99 mg/dL  Magnesium     Status: None   Collection Time: 06/01/20  4:19 AM  Result Value Ref Range   Magnesium 1.8 1.7 - 2.4 mg/dL  Basic metabolic panel     Status: Abnormal   Collection Time: 06/01/20  4:19 AM  Result Value Ref Range   Sodium 126 (L) 135 - 145 mmol/L   Potassium 3.7 3.5 - 5.1 mmol/L   Chloride 94 (L) 98  - 111 mmol/L   CO2 25 22 - 32 mmol/L   Glucose, Bld 117 (H) 70 - 99 mg/dL   BUN 22 8 - 23 mg/dL   Creatinine, Ser 1.65 (H) 0.44 - 1.00 mg/dL   Calcium 8.4 (L) 8.9 - 10.3 mg/dL   GFR, Estimated 33 (L) >60 mL/min   Anion gap 7 5 - 15  CBC with Differential/Platelet     Status: Abnormal   Collection Time: 06/01/20  4:19 AM  Result Value Ref Range   WBC 4.8 4.0 - 10.5 K/uL   RBC 2.84 (L) 3.87 - 5.11 MIL/uL   Hemoglobin 8.4 (L) 12.0 - 15.0 g/dL   HCT 27.0 (L) 36.0 - 46.0 %   MCV 95.1 80.0 - 100.0 fL   MCH 29.6 26.0 - 34.0 pg   MCHC 31.1 30.0 - 36.0 g/dL   RDW 21.6 (H) 11.5 - 15.5 %   Platelets 103 (L) 150 - 400 K/uL   nRBC 0.0 0.0 - 0.2 %   Neutrophils Relative % 63 %   Neutro Abs 3.0 1.7 - 7.7 K/uL   Lymphocytes Relative 24 %   Lymphs Abs 1.1 0.7 - 4.0 K/uL   Monocytes Relative 10 %   Monocytes Absolute 0.5 0.1 - 1.0 K/uL   Eosinophils Relative 2 %   Eosinophils Absolute 0.1 0.0 - 0.5 K/uL   Basophils Relative 1 %   Basophils Absolute 0.0 0.0 - 0.1 K/uL   Immature Granulocytes 0 %   Abs Immature Granulocytes 0.01 0.00 - 0.07 K/uL  Phosphorus     Status: None   Collection Time: 06/01/20  4:19 AM  Result Value Ref Range   Phosphorus 3.2 2.5 - 4.6 mg/dL  Glucose, capillary     Status: Abnormal   Collection Time: 06/01/20  6:43 AM  Result Value Ref Range   Glucose-Capillary 180 (H) 70 - 99 mg/dL  Urinalysis, Routine w reflex microscopic     Status: Abnormal   Collection Time: 06/01/20  9:23 AM  Result Value Ref Range   Color, Urine AMBER (A) YELLOW   APPearance TURBID (A) CLEAR   Specific Gravity, Urine 1.013 1.005 - 1.030   pH 5.0 5.0 - 8.0   Glucose, UA NEGATIVE NEGATIVE mg/dL   Hgb urine dipstick MODERATE (A) NEGATIVE   Bilirubin Urine NEGATIVE NEGATIVE   Ketones, ur 5 (A) NEGATIVE mg/dL   Protein, ur 30 (A) NEGATIVE mg/dL   Nitrite NEGATIVE NEGATIVE   Leukocytes,Ua LARGE (A) NEGATIVE   RBC / HPF 21-50 0 - 5 RBC/hpf   WBC, UA >50 (H) 0 - 5 WBC/hpf   Bacteria, UA  MANY (A) NONE SEEN   Squamous Epithelial / LPF 6-10 0 - 5   WBC Clumps  PRESENT   Glucose, capillary     Status: Abnormal   Collection Time: 06/01/20 11:28 AM  Result Value Ref Range   Glucose-Capillary 130 (H) 70 - 99 mg/dL   *Note: Due to a large number of results and/or encounters for the requested time period, some results have not been displayed. A complete set of results can be found in Results Review.    Recent Results (from the past 240 hour(s))  Resp Panel by RT-PCR (Flu A&B, Covid) Nasopharyngeal Swab     Status: None   Collection Time: 05/28/20  2:06 PM   Specimen: Nasopharyngeal Swab; Nasopharyngeal(NP) swabs in vial transport medium  Result Value Ref Range Status   SARS Coronavirus 2 by RT PCR NEGATIVE NEGATIVE Final    Comment: (NOTE) SARS-CoV-2 target nucleic acids are NOT DETECTED.  The SARS-CoV-2 RNA is generally detectable in upper respiratory specimens during the acute phase of infection. The lowest concentration of SARS-CoV-2 viral copies this assay can detect is 138 copies/mL. A negative result does not preclude SARS-Cov-2 infection and should not be used as the sole basis for treatment or other patient management decisions. A negative result may occur with  improper specimen collection/handling, submission of specimen other than nasopharyngeal swab, presence of viral mutation(s) within the areas targeted by this assay, and inadequate number of viral copies(<138 copies/mL). A negative result must be combined with clinical observations, patient history, and epidemiological information. The expected result is Negative.  Fact Sheet for Patients:  EntrepreneurPulse.com.au  Fact Sheet for Healthcare Providers:  IncredibleEmployment.be  This test is no t yet approved or cleared by the [place] FDA and  has been authorized for detection and/or diagnosis of SARS-CoV-2 by FDA under an Emergency Use Authorization (EUA). This  EUA will remain  in effect (meaning this test can be used) for the duration of the COVID-19 declaration under Section 564(b)(1) of the Act, 21 U.S.C.section 360bbb-3(b)(1), unless the authorization is terminated  or revoked sooner.       Influenza A by PCR NEGATIVE NEGATIVE Final   Influenza B by PCR NEGATIVE NEGATIVE Final    Comment: (NOTE) The Xpert Xpress SARS-CoV-2/FLU/RSV plus assay is intended as an aid in the diagnosis of influenza from Nasopharyngeal swab specimens and should not be used as a sole basis for treatment. Nasal washings and aspirates are unacceptable for Xpert Xpress SARS-CoV-2/FLU/RSV testing.  Fact Sheet for Patients: EntrepreneurPulse.com.au  Fact Sheet for Healthcare Providers: IncredibleEmployment.be  This test is not yet approved or cleared by the [place] FDA and has been authorized for detection and/or diagnosis of SARS-CoV-2 by FDA under an Emergency Use Authorization (EUA). This EUA will remain in effect (meaning this test can be used) for the duration of the COVID-19 declaration under Section 564(b)(1) of the Act, 21 U.S.C. section 360bbb-3(b)(1), unless the authorization is terminated or revoked.  Performed at [facility], [place] [address], [place], [place] [address]   MRSA PCR Screening     Status: None   Collection Time: 05/28/20  8:29 PM   Specimen: Nasopharyngeal  Result Value Ref Range Status   MRSA by PCR NEGATIVE NEGATIVE Final    Comment:        The GeneXpert MRSA Assay (FDA approved for NASAL specimens only), is one component of a comprehensive MRSA colonization surveillance program. It is not intended to diagnose MRSA infection nor to guide or monitor treatment for MRSA infections. Performed at [facility], [place] [address] [place], [place] [address]  Radiology Studies: No results found. DG Chest Portable 1 View  Final Result      Scheduled Meds: .  allopurinol  100 mg Oral BID  . apixaban  5 mg Oral BID  . atorvastatin  10 mg Oral QPM  . Chlorhexidine Gluconate Cloth  6 each Topical Q0600  . clopidogrel  75 mg Oral Daily  . darbepoetin (ARANESP) injection - DIALYSIS  60 mcg Intravenous Q Fri-HD  . doxycycline  100 mg Oral Q12H  . insulin aspart  0-5 Units Subcutaneous QHS  . insulin aspart  0-9 Units Subcutaneous TID WC  . insulin glargine  10 Units Subcutaneous QHS  . ipratropium-albuterol  3 mL Nebulization Once  . levothyroxine  12.5 mcg Oral Q0600  . levothyroxine  150 mcg Oral Q0600  . loratadine  5 mg Oral QPM  . metoprolol succinate  12.5 mg Oral Daily  . midodrine  5 mg Oral TID WC  . mometasone-formoterol  2 puff Inhalation BID  . pantoprazole  40 mg Oral Daily  . prenatal multivitamin  1 tablet Oral Q0600  . sodium chloride flush  3 mL Intravenous Q12H  . tobramycin  2 drop Both Eyes Q6H  . venlafaxine XR  37.5 mg Oral Q breakfast   PRN Meds: sodium chloride, acetaminophen **OR** acetaminophen, albuterol, methocarbamol, oxyCODONE, polyethylene glycol, sodium chloride flush Continuous Infusions: . sodium chloride       LOS: 4 days  Time spent: Greater than 50% of the 35 minute visit was spent in counseling/coordination of care for the patient as laid out in the A&P.   Dwyane Dee, MD [facility] 06/01/2020, 12:40 PM

## 2020-06-02 LAB — CBC WITH DIFFERENTIAL/PLATELET
Abs Immature Granulocytes: 0.02 10*3/uL (ref 0.00–0.07)
Basophils Absolute: 0 10*3/uL (ref 0.0–0.1)
Basophils Relative: 0 %
Eosinophils Absolute: 0.1 10*3/uL (ref 0.0–0.5)
Eosinophils Relative: 3 %
HCT: 27.3 % — ABNORMAL LOW (ref 36.0–46.0)
Hemoglobin: 8.6 g/dL — ABNORMAL LOW (ref 12.0–15.0)
Immature Granulocytes: 0 %
Lymphocytes Relative: 19 %
Lymphs Abs: 0.9 10*3/uL (ref 0.7–4.0)
MCH: 29.9 pg (ref 26.0–34.0)
MCHC: 31.5 g/dL (ref 30.0–36.0)
MCV: 94.8 fL (ref 80.0–100.0)
Monocytes Absolute: 0.4 10*3/uL (ref 0.1–1.0)
Monocytes Relative: 9 %
Neutro Abs: 3.2 10*3/uL (ref 1.7–7.7)
Neutrophils Relative %: 69 %
Platelets: 108 10*3/uL — ABNORMAL LOW (ref 150–400)
RBC: 2.88 MIL/uL — ABNORMAL LOW (ref 3.87–5.11)
RDW: 21.5 % — ABNORMAL HIGH (ref 11.5–15.5)
WBC: 4.7 10*3/uL (ref 4.0–10.5)
nRBC: 0 % (ref 0.0–0.2)

## 2020-06-02 LAB — BASIC METABOLIC PANEL
Anion gap: 8 (ref 5–15)
BUN: 25 mg/dL — ABNORMAL HIGH (ref 8–23)
CO2: 27 mmol/L (ref 22–32)
Calcium: 9 mg/dL (ref 8.9–10.3)
Chloride: 93 mmol/L — ABNORMAL LOW (ref 98–111)
Creatinine, Ser: 1.52 mg/dL — ABNORMAL HIGH (ref 0.44–1.00)
GFR, Estimated: 36 mL/min — ABNORMAL LOW (ref >60)
Glucose, Bld: 152 mg/dL — ABNORMAL HIGH (ref 70–99)
Potassium: 4 mmol/L (ref 3.5–5.1)
Sodium: 128 mmol/L — ABNORMAL LOW (ref 135–145)

## 2020-06-02 LAB — GLUCOSE, CAPILLARY
Glucose-Capillary: 117 mg/dL — ABNORMAL HIGH (ref 70–99)
Glucose-Capillary: 120 mg/dL — ABNORMAL HIGH (ref 70–99)
Glucose-Capillary: 107 mg/dL — ABNORMAL HIGH (ref 70–99)
Glucose-Capillary: 118 mg/dL — ABNORMAL HIGH (ref 70–99)

## 2020-06-02 LAB — PHOSPHORUS: Phosphorus: 3 mg/dL (ref 2.5–4.6)

## 2020-06-02 LAB — MAGNESIUM: Magnesium: 1.8 mg/dL (ref 1.7–2.4)

## 2020-06-02 MED ORDER — OXYCODONE HCL 5 MG PO TABS
ORAL_TABLET | ORAL | Status: AC
  Filled 2020-06-02: qty 1

## 2020-06-02 MED ORDER — MIDODRINE HCL 5 MG PO TABS
ORAL_TABLET | ORAL | Status: AC
  Filled 2020-06-02: qty 1

## 2020-06-02 MED ORDER — HEPARIN SODIUM (PORCINE) 1000 UNIT/ML IJ SOLN
INTRAMUSCULAR | Status: AC
  Filled 2020-06-02: qty 1

## 2020-06-02 NOTE — Progress Notes (Signed)
PROGRESS NOTE    Alexis [name]   YOM:[id number]  DOB: [birth year]  DOA: 05/28/2020     5  PCP: Elise Benne  CC: SOB, difficulty getting out of the house  Hospital Course: Ms. Lofland is a 70 yo female with PMH PAD, recent R BKA, CAD, HTN, DMII, COPD, PAF (no longer on Eliquis 2/2  recent GIB and unclear if this was to be restarted after Hgb stabilized), ESRD-MWF HD, depression, cirrhosis, obesity, anxiety, osteoarthritis, hypothyroidism who was admitted with worsening pain in her recent R BKA site as well as significant difficulty mobilizing especially forgetting to her most recent dialysis session.  Family has had great difficulty with getting patient out of bed in order to transport her to dialysis.  Due to being unable to go to her session on 05/28/2020, she was instructed to call EMS for patient to be transferred to the hospital.  She has an extensive vascular history and was recently treated at [facility].  Recent discharge summaries have been reviewed from surgery as well as GI from a prior GI bleed.  She was taken off of Eliquis but there is difficulty understanding if this was to be resumed with and/or without her plavix.    PT/OT were consulted on admission given her difficulty with mobility at home.  Patient and family are amenable for placement in rehab if recommended at time of discharge.  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  She was found to have a partially dehisced wound of her BKA.  She was placed in a dry dressing with Ace wrap and stump protector being ordered; also started on 10-day course of doxycycline.  She was instructed to keep staples in place and return for follow-up again in 2 weeks.  After multiple discussions during hospitalization plan for discharge will be patient to discharge home with home hospice.  If she continues to remain on dialysis after 2 weeks,  hospice company will likely sign off.  If she decides to discontinue dialysis, then hospice will remain in place.  This has been thoroughly discussed with patient and family who understand.   Interval History:  Seen in HD and resting comfortably during session.  Reviewed discharge plan for tomorrow; patient understands and amenable (see discussion below).  Still having some leg pain but if getting medicine when needed she says it's more bearable now.   ROS: Constitutional: positive for fatigue, negative for chills and fevers, Respiratory: negative for cough, Cardiovascular: negative for chest pain and Gastrointestinal: negative for abdominal pain  Assessment & Plan: * SOB (shortness of breath)-resolved as of 05/30/2020 -Presumed due to volume overload.  Continuing on dialysis as noted  Physical deconditioning -Patient having significant difficulty with mobilization at home since surgery and family had great difficulty being able to transport patient to dialysis safely on 05/28/2020.  Rehab has now been declined by patient -After ongoing discussions, plan is now for patient to discharge home with [facility]. If she still needs and decides to remain on HD after 2 weeks then [facility] may sign off; if patient cancels HD then hospice would remain in place. Patient and family are aware of this and I personally discussed this with the patient on 06/02/20  Wound infection - seen by ortho surgery prior to admission; partial dehiscence of right BKA.  Dry gauze and Ace wrap placed.  Staples to remain in place per orthopedic  surgery. -Continue 10-day course of doxycycline -Follow-up with orthopedic surgery in 2 weeks  ESRD on hemodialysis [facility]) -Patient missed outpatient session on 05/28/2020 due to difficulty with mobilization from her recent right BKA -patient plans to continue HD at discharge for at least 2 weeks; see physical deconditioning  Permanent atrial fibrillation ([place]) - from discharge  summary at [facility]: "Patient had extensive history of left femoral embolectomy and 4 compartment fasciotomies with right lower extremity bypass graft done in December 2021. Had been on Plavix and Eliquis which has been withheld because of acute GI bleed. Discussed with GI about anticoagulation, given patient received Eliquis on 04/21/2020 and also Plavix held. Vascular surgery consulted for follow-up. As per discussion with vascular surgery, no emergent surgical intervention needed at this time. Okay to hold off on antiplatelets/anticoagulants at this time but recommended to resume home medications including Plavix and Eliquis once her anemia is addressed." - given that her Hgb seems to be stable in the 8-9 g/dL range, will restart Eliquis again for now; if re-bleeds this likely will be discontinued; I will have her follow up with vascular again at discharge   Peripheral vascular disease ([place]) -Prior notes reviewed under care everywhere -Continue Plavix  CAD (coronary artery disease) - not on asa 2/2 GIB hx and also is on plavix/eliquis - continue lipitor, plavix, Toprol; not on ACEi due to hx hyperK  COPD (chronic obstructive pulmonary disease) (HCC) -Continue Symbicort and oxygen  S/P BKA (below knee amputation), right ([place]) - s/p surgery on 05/07/20 - see wound infection  -Continue pain control.  Robaxin added for muscle spasms  Chronic systolic CHF (congestive heart failure) (HCC) - volume overloaded 2/2 ESRD - continue chronic HD - last echo 10/31/1192: EF 17%, diastolic function unable to be evaluated due to A. fib.  LV global hypokinesis without RWMA  Other cirrhosis of liver (HCC) - asymptomatic; likely NASH - last U/S reviewed from 09/18/19: "Coarse echotexture of the liver consistent with cirrhosis/fatty Infiltration"  Volume overload -Continue on dialysis schedule per nephrology  Hypothyroidism - s/p RAI ablation -Continue home Synthroid  HTN (hypertension) -Continue Toprol  and midodrine   Old records reviewed in assessment of this patient  Antimicrobials: Doxycycline  DVT prophylaxis:  apixaban (ELIQUIS) tablet 5 mg   Code Status:   Code Status: DNR Family Communication: Daughter  Disposition Plan: Status is: Inpatient  Remains inpatient appropriate because:IV treatments appropriate due to intensity of illness or inability to take PO and Inpatient level of care appropriate due to severity of illness   Dispo: The patient is from: Home              Anticipated d/c is to: Home with home health              Patient currently is not medically stable to d/c.   Difficult to place patient No   Risk of unplanned readmission score: Unplanned Admission- Pilot do not use: 27.55   Objective: Blood pressure (!) 143/59, pulse 61, temperature 98.2 F (36.8 C), temperature source Oral, resp. rate (!) 22, height 5' 2"  (1.575 m), weight 95.8 kg, SpO2 96 %.  Examination: General appearance: alert, cooperative and no distress Head: Normocephalic, without obvious abnormality, atraumatic Eyes: EOMI Lungs: coarse breath sounds  Chest: left Perm cath in place Heart: irregularly irregular rhythm and S1, S2 normal Abdomen: obese, soft, NT, ND, BS present Extremities: R BKA noted with dressing in place and ACE wrap. 2-3+ LE edema up to thighs Skin: mobility and turgor  normal Neurologic: Grossly normal  Consultants:   Nephrology  Procedures:     Data Reviewed: I have personally reviewed following labs and imaging studies Results for orders placed or performed during the hospital encounter of 05/28/20 (from the past 24 hour(s))  Glucose, capillary     Status: Abnormal   Collection Time: 06/01/20  4:56 PM  Result Value Ref Range   Glucose-Capillary 190 (H) 70 - 99 mg/dL  Glucose, capillary     Status: Abnormal   Collection Time: 06/01/20  8:35 PM  Result Value Ref Range   Glucose-Capillary 137 (H) 70 - 99 mg/dL  Glucose, capillary     Status: Abnormal    Collection Time: 06/02/20  6:46 AM  Result Value Ref Range   Glucose-Capillary 117 (H) 70 - 99 mg/dL  Magnesium     Status: None   Collection Time: 06/02/20  8:22 AM  Result Value Ref Range   Magnesium 1.8 1.7 - 2.4 mg/dL  Basic metabolic panel     Status: Abnormal   Collection Time: 06/02/20  8:22 AM  Result Value Ref Range   Sodium 128 (L) 135 - 145 mmol/L   Potassium 4.0 3.5 - 5.1 mmol/L   Chloride 93 (L) 98 - 111 mmol/L   CO2 27 22 - 32 mmol/L   Glucose, Bld 152 (H) 70 - 99 mg/dL   BUN 25 (H) 8 - 23 mg/dL   Creatinine, Ser 1.52 (H) 0.44 - 1.00 mg/dL   Calcium 9.0 8.9 - 10.3 mg/dL   GFR, Estimated 36 (L) >60 mL/min   Anion gap 8 5 - 15  CBC with Differential/Platelet     Status: Abnormal   Collection Time: 06/02/20  8:22 AM  Result Value Ref Range   WBC 4.7 4.0 - 10.5 K/uL   RBC 2.88 (L) 3.87 - 5.11 MIL/uL   Hemoglobin 8.6 (L) 12.0 - 15.0 g/dL   HCT 27.3 (L) 36.0 - 46.0 %   MCV 94.8 80.0 - 100.0 fL   MCH 29.9 26.0 - 34.0 pg   MCHC 31.5 30.0 - 36.0 g/dL   RDW 21.5 (H) 11.5 - 15.5 %   Platelets 108 (L) 150 - 400 K/uL   nRBC 0.0 0.0 - 0.2 %   Neutrophils Relative % 69 %   Neutro Abs 3.2 1.7 - 7.7 K/uL   Lymphocytes Relative 19 %   Lymphs Abs 0.9 0.7 - 4.0 K/uL   Monocytes Relative 9 %   Monocytes Absolute 0.4 0.1 - 1.0 K/uL   Eosinophils Relative 3 %   Eosinophils Absolute 0.1 0.0 - 0.5 K/uL   Basophils Relative 0 %   Basophils Absolute 0.0 0.0 - 0.1 K/uL   Immature Granulocytes 0 %   Abs Immature Granulocytes 0.02 0.00 - 0.07 K/uL  Phosphorus     Status: None   Collection Time: 06/02/20  8:22 AM  Result Value Ref Range   Phosphorus 3.0 2.5 - 4.6 mg/dL  Glucose, capillary     Status: Abnormal   Collection Time: 06/02/20 12:10 PM  Result Value Ref Range   Glucose-Capillary 120 (H) 70 - 99 mg/dL   *Note: Due to a large number of results and/or encounters for the requested time period, some results have not been displayed. A complete set of results can be found in  Results Review.    Recent Results (from the past 240 hour(s))  Resp Panel by RT-PCR (Flu A&B, Covid) Nasopharyngeal Swab     Status: None   Collection Time:  05/28/20  2:06 PM   Specimen: Nasopharyngeal Swab; Nasopharyngeal(NP) swabs in vial transport medium  Result Value Ref Range Status   SARS Coronavirus 2 by RT PCR NEGATIVE NEGATIVE Final    Comment: (NOTE) SARS-CoV-2 target nucleic acids are NOT DETECTED.  The SARS-CoV-2 RNA is generally detectable in upper respiratory specimens during the acute phase of infection. The lowest concentration of SARS-CoV-2 viral copies this assay can detect is 138 copies/mL. A negative result does not preclude SARS-Cov-2 infection and should not be used as the sole basis for treatment or other patient management decisions. A negative result may occur with  improper specimen collection/handling, submission of specimen other than nasopharyngeal swab, presence of viral mutation(s) within the areas targeted by this assay, and inadequate number of viral copies(<138 copies/mL). A negative result must be combined with clinical observations, patient history, and epidemiological information. The expected result is Negative.  Fact Sheet for Patients:  EntrepreneurPulse.com.au  Fact Sheet for Healthcare Providers:  IncredibleEmployment.be  This test is no t yet approved or cleared by the [place] FDA and  has been authorized for detection and/or diagnosis of SARS-CoV-2 by FDA under an Emergency Use Authorization (EUA). This EUA will remain  in effect (meaning this test can be used) for the duration of the COVID-19 declaration under Section 564(b)(1) of the Act, 21 U.S.C.section 360bbb-3(b)(1), unless the authorization is terminated  or revoked sooner.       Influenza A by PCR NEGATIVE NEGATIVE Final   Influenza B by PCR NEGATIVE NEGATIVE Final    Comment: (NOTE) The Xpert Xpress SARS-CoV-2/FLU/RSV plus assay is  intended as an aid in the diagnosis of influenza from Nasopharyngeal swab specimens and should not be used as a sole basis for treatment. Nasal washings and aspirates are unacceptable for Xpert Xpress SARS-CoV-2/FLU/RSV testing.  Fact Sheet for Patients: EntrepreneurPulse.com.au  Fact Sheet for Healthcare Providers: IncredibleEmployment.be  This test is not yet approved or cleared by the [place] FDA and has been authorized for detection and/or diagnosis of SARS-CoV-2 by FDA under an Emergency Use Authorization (EUA). This EUA will remain in effect (meaning this test can be used) for the duration of the COVID-19 declaration under Section 564(b)(1) of the Act, 21 U.S.C. section 360bbb-3(b)(1), unless the authorization is terminated or revoked.  Performed at [facility], [place] [address] [place], [place] [address]   MRSA PCR Screening     Status: None   Collection Time: 05/28/20  8:29 PM   Specimen: Nasopharyngeal  Result Value Ref Range Status   MRSA by PCR NEGATIVE NEGATIVE Final    Comment:        The GeneXpert MRSA Assay (FDA approved for NASAL specimens only), is one component of a comprehensive MRSA colonization surveillance program. It is not intended to diagnose MRSA infection nor to guide or monitor treatment for MRSA infections. Performed at [facility], [place] [address], [place], [place] [address]      Radiology Studies: No results found. DG Chest Portable 1 View  Final Result      Scheduled Meds: . allopurinol  100 mg Oral BID  . apixaban  5 mg Oral BID  . atorvastatin  10 mg Oral QPM  . Chlorhexidine Gluconate Cloth  6 each Topical Q0600  . clopidogrel  75 mg Oral Daily  . darbepoetin (ARANESP) injection - DIALYSIS  60 mcg Intravenous Q Fri-HD  . doxycycline  100 mg Oral Q12H  . insulin aspart  0-5 Units Subcutaneous QHS  .  insulin aspart  0-9 Units Subcutaneous TID WC  . insulin glargine  10  Units Subcutaneous QHS  . ipratropium-albuterol  3 mL Nebulization Once  . levothyroxine  12.5 mcg Oral Q0600  . levothyroxine  150 mcg Oral Q0600  . loratadine  5 mg Oral QPM  . metoprolol succinate  12.5 mg Oral Daily  . midodrine      . midodrine  5 mg Oral TID WC  . mometasone-formoterol  2 puff Inhalation BID  . pantoprazole  40 mg Oral Daily  . prenatal multivitamin  1 tablet Oral Q0600  . sodium chloride flush  3 mL Intravenous Q12H  . tobramycin  2 drop Both Eyes Q6H  . venlafaxine XR  37.5 mg Oral Q breakfast   PRN Meds: sodium chloride, acetaminophen **OR** acetaminophen, albuterol, methocarbamol, oxyCODONE, polyethylene glycol, sodium chloride flush Continuous Infusions: . sodium chloride       LOS: 5 days  Time spent: Greater than 50% of the 35 minute visit was spent in counseling/coordination of care for the patient as laid out in the A&P.   Dwyane Dee, MD [facility] 06/02/2020, 3:32 PM

## 2020-06-02 NOTE — Progress Notes (Signed)
[facility] Progress Note   Subjective:   No new issues.  Dreaming about eating chocolate pudding when I walked in the room- she was mimicking putting a spoon to her mouth    Objective Vitals:   06/02/20 0456 06/02/20 0500 06/02/20 0812 06/02/20 1053  BP: (!) 124/48   (!) 121/41  Pulse: (!) 50   (!) 58  Resp: 17   17  Temp: 97.6 F (36.4 C)   97.8 F (36.6 C)  TempSrc: Oral   Oral  SpO2: 95%  96% 99%  Weight:  97.6 kg    Height:       Physical Exam General: NAD Heart: irreg irreg, no rub Lungs: no wheezing, crackles Abdomen: soft + abd wall edema Extremities: 2+ pitting edema L, R BKA stump dressing intact Dialysis Access:  [facility] c/d/i  Additional Objective Labs: Basic Metabolic Panel: Recent Labs  Lab 05/31/20 0256 06/01/20 0419 06/02/20 0822  NA 128* 126* 128*  K 3.7 3.7 4.0  CL 96* 94* 93*  CO2 23 25 27   GLUCOSE 134* 117* 152*  BUN 18 22 25*  CREATININE 1.45* 1.65* 1.52*  CALCIUM 8.3* 8.4* 9.0  PHOS 2.4* 3.2 3.0   Liver Function Tests: Recent Labs  Lab 05/28/20 1406 05/30/20 0306  AST 57*  --   ALT 14  --   ALKPHOS 208*  --   BILITOT 1.6*  --   PROT 6.3*  --   ALBUMIN 2.1* 2.0*   No results for input(s): LIPASE, AMYLASE in the last 168 hours. CBC: Recent Labs  Lab 05/29/20 0454 05/30/20 0306 05/31/20 0256 06/01/20 0419 06/02/20 0822  WBC 4.7 5.1 5.4 4.8 4.7  NEUTROABS  --   --  3.4 3.0 3.2  HGB 8.2* 7.8* 8.3* 8.4* 8.6*  HCT 27.2* 24.9* 27.4* 27.0* 27.3*  MCV 96.1 94.3 96.1 95.1 94.8  PLT 106* 115* 115* 103* 108*   Blood Culture    Component Value Date/Time   SDES  05/25/2019 1740    LEFT ANTECUBITAL Performed at [facility], [address] [place], [place] [address]    [facility]  05/25/2019 1740    BOTTLES DRAWN AEROBIC AND ANAEROBIC Blood Culture results may not be optimal due to an inadequate volume of blood received in culture bottles Performed at [facility], [address]  [place], [place] [address]    CULT  05/25/2019 1740    NO GROWTH 5 DAYS Performed at [facility], [place] [address] [place], [place] [address]    REPTSTATUS 05/30/2019 FINAL 05/25/2019 1740    Cardiac Enzymes: No results for input(s): CKTOTAL, CKMB, CKMBINDEX, TROPONINI in the last 168 hours. CBG: Recent Labs  Lab 06/01/20 1128 06/01/20 1656 06/01/20 2035 06/02/20 0646 06/02/20 1210  GLUCAP 130* 190* 137* 117* 120*   Iron Studies:  No results for input(s): IRON, TIBC, TRANSFERRIN, FERRITIN in the last 72 hours. @lablastinr3 @ Studies/Results: No results found. Medications: . sodium chloride     . allopurinol  100 mg Oral BID  . apixaban  5 mg Oral BID  . atorvastatin  10 mg Oral QPM  . Chlorhexidine Gluconate Cloth  6 each Topical Q0600  . clopidogrel  75 mg Oral Daily  . darbepoetin (ARANESP) injection - DIALYSIS  60 mcg Intravenous Q Fri-HD  . doxycycline  100 mg Oral Q12H  . insulin aspart  0-5 Units Subcutaneous QHS  . insulin aspart  0-9 Units Subcutaneous TID WC  .  insulin glargine  10 Units Subcutaneous QHS  . ipratropium-albuterol  3 mL Nebulization Once  . levothyroxine  12.5 mcg Oral Q0600  . levothyroxine  150 mcg Oral Q0600  . loratadine  5 mg Oral QPM  . metoprolol succinate  12.5 mg Oral Daily  . midodrine      . midodrine  5 mg Oral TID WC  . mometasone-formoterol  2 puff Inhalation BID  . pantoprazole  40 mg Oral Daily  . prenatal multivitamin  1 tablet Oral Q0600  . sodium chloride flush  3 mL Intravenous Q12H  . tobramycin  2 drop Both Eyes Q6H  . venlafaxine XR  37.5 mg Oral Q breakfast      Assessment/Plan **dyspnea, hypoxia: CXR with pulm edema in setting of missed dialysis.  Clinially improved s/p HD 3/30, dial down wt with UF on HD.     **ESRD on HD: [facility] MWF - missed Wed due to inability to transport there. Involved SW given this transportation issue related to her weakness/husband inability to move around will continue and  now I'm hearing she is going to do hospice but continued HD x 2 weeks - continue MWF schedule for now   **Anemia: Hb in the 8s, transfused < 7.  Iron ok to hold iron for now given stump infection. Started ESA  **A fib: rate controlled on BB, eliquis.    **COPD: improved  **Stump cellulitis: doxycycline; no SIRS.    Multiple other medical issues per primary.   Madelon Lips MD 06/02/2020, 2:36 PM  [facility] Pager: [phone]

## 2020-06-02 NOTE — Progress Notes (Signed)
PT Cancellation Note  Patient Details Name: KAILE BIXLER MRN: [id number] DOB: 1950-09-06   Cancelled Treatment:    Reason Eval/Treat Not Completed: Patient declined, no reason specified adamantly refused all therapy interventions today due to concerns of "me hurting too bad before I go home after HD". Reports all forms of mobility cause severe pain. Agreeable to education of residual limb care, discussed desensitization techniques to help manage phantom pain as well as importance of quad sets/HS stretching but she did not tolerate either well and tells [place] she already has been doing both of these. Also tells [place] she plans on going home with hospice at this point, "they told me I only have 2 more months to live".   If going home with hospice, will require hospital bed, hoyer lift, wheelchair, BSC, and preferably 24/7 care.    Windell Norfolk, DPT, PN1   Supplemental Physical Therapist [facility]    Pager [phone] Acute Rehab Office [phone]

## 2020-06-02 NOTE — Progress Notes (Signed)
VAST consult received to obtain PIV access. Pt currently has no IV meds/fluids or studies ordered. Per best practice, no IV access will be placed at this time to allow for vein preservation and reduce the risk of infection from unneccessary invasive procedures. Pt's nurse notified via [place]. If pt's condition changes and IV access is needed, unit RN to place new IVT consult.

## 2020-06-02 NOTE — Care Management Important Message (Signed)
Important Message  Patient Details  Name: Alexis [name] MRN: [id number] Date of Birth: [birth year]   Medicare Important Message Given:  Yes     Barb Merino Jarin Cornfield 06/02/2020, 2:44 PM

## 2020-06-02 NOTE — TOC Progression Note (Addendum)
Transition of Care [facility]) - Progression Note    Patient Details  Name: Alexis [name] MRN: [id number] Date of Birth: November 20, 1950  Transition of [facility] CM/SW Contact  Alexis Crews, RN Phone Number: [phone] 06/02/2020, 4:19 PM  Clinical Narrative:     Spoke with patient at the bedside several times today. Discussed [facility] and wrap around home care services vs SNF to LTC at [facility]. Discussed that to received hospice services, she would have to elect to stop dialysis in 2 weeks. Discussed that if she goes to nursing facility that her dialysis center may need to be changed. Patient verbalized that she wants to go home. She stated that she is ready, and she wants to be home with her family.   Spoke with patient's daughter, Alexis [name], this morning. Alexis [name] is not able to provide care and is not able to assist with dialysis needs to get on/off transportation bus. Alexis [name] stated that she needs a a proper ramp, because current ramp drops down into grass making wheelchair mobility difficult. Alexis [name] stated that she is leaving the decisions to patient, and she will honor her choices.   Spoke with husband, Alexis [name], on the phone today. Discussed transition home with hospice vs SNF to LTC at [facility]. Advised that [facility] had stated that home care hours provided by [employer] was only for 20 hours total, not 20 hrs/week. Discussed if had any FMLA benefits that he could use to be with patient. Alexis [name] is not sure, but he agreed to follow up with HR. Alexis [name] stated that he has to work, and that he has missed about 20 days over the last 5 months.   Spoke with Alexis [name] at [place] to discuss 20 hours home care and whether or not any other benefits will help her to safely transition home. Alexis [name] to reach out to [employer].   Left message for [facility] transportation to inquire if they can transport patient to/from dialysis from [place] to [employer].   4:40p -Received call back from Alexis [name]. They can provide transportation to [place]. Patient would be able to continue her usual seat time on MWF 11a.   Spoke with Alexis [name] on the phone to discuss transition options. Discussed going to nursing facility, [facility], who can provide dialysis transportation to her current center vs. Home with [facility], but no dialysis. Advised that with the transition from SNF to LTC that patient would have to use her check to pay for her care. Alexis [name] verbalized understanding. Encouraged Alexis [name] to discuss options with patient - Alexis [name] agreed.   TOC following for transition needs.   Expected Discharge Plan: Home w Hospice Care Barriers to Discharge: Continued Medical Work up  Expected Discharge Plan and Services Expected Discharge Plan: [place] In-house Referral: Clinical Social Work,Hospice / Palliative Care Discharge Planning Services: CM Consult Post Acute Care Choice: Hospice Living arrangements for the past 2 months: Single Family Home                 DME Arranged: N/A DME Agency: NA       HH Arranged: NA HH Agency: NA         Social Determinants of Health (SDOH) Interventions    Readmission Risk Interventions No flowsheet data found.

## 2020-06-03 LAB — BASIC METABOLIC PANEL
Anion gap: 9 (ref 5–15)
BUN: 14 mg/dL (ref 8–23)
CO2: 24 mmol/L (ref 22–32)
Calcium: 8.4 mg/dL — ABNORMAL LOW (ref 8.9–10.3)
Chloride: 99 mmol/L (ref 98–111)
Creatinine, Ser: 1.04 mg/dL — ABNORMAL HIGH (ref 0.44–1.00)
GFR, Estimated: 57 mL/min — ABNORMAL LOW (ref >60)
Glucose, Bld: 124 mg/dL — ABNORMAL HIGH (ref 70–99)
Potassium: 4.1 mmol/L (ref 3.5–5.1)
Sodium: 132 mmol/L — ABNORMAL LOW (ref 135–145)

## 2020-06-03 LAB — GLUCOSE, CAPILLARY: Glucose-Capillary: 107 mg/dL — ABNORMAL HIGH (ref 70–99)

## 2020-06-03 LAB — MAGNESIUM: Magnesium: 1.7 mg/dL (ref 1.7–2.4)

## 2020-06-03 LAB — PHOSPHORUS: Phosphorus: 2.2 mg/dL — ABNORMAL LOW (ref 2.5–4.6)

## 2020-06-03 MED ORDER — APIXABAN 5 MG PO TABS: 5.0000 mg | ORAL_TABLET | Freq: Two times a day (BID) | ORAL | 3 refills | Status: AC

## 2020-06-03 MED ORDER — DOXYCYCLINE HYCLATE 100 MG PO TABS: 100.0000 mg | ORAL_TABLET | Freq: Two times a day (BID) | ORAL | 0 refills | Status: AC

## 2020-06-03 MED ORDER — INSULIN DEGLUDEC 100 UNIT/ML ~~LOC~~ SOPN: 10.0000 [IU] | PEN_INJECTOR | Freq: Every day | SUBCUTANEOUS | Status: AC

## 2020-06-03 NOTE — TOC Transition Note (Signed)
Transition of Care [facility]) - CM/SW Discharge Note   Patient Details  Name: DENECIA BRUNETTE MRN: [id number] Date of Birth: April 01, 1950  Transition of Care [facility]) CM/SW Contact:  Bartholomew Crews, RN Phone Number: [phone] 06/03/2020, 8:55 AM   Clinical Narrative:     Spoke with patient and MD at the bedside. She has elected to stop hemodialysis and transition home with [facility]. Spoke with Juanda Crumble on the phone - he is supportive of patient decision and requests to be contacted when ambulance arrives for transport. Spoke with Almyra Free - discussed patient options for discharge and that patient has elected home with hospice - Almyra Free supportive of patient decision. Spoke with Tim at [facility] who will arrange for admission to hospice today. PTAR to be arranged for transport home. No further TOC needs identified at this time.   Final next level of care: Home w Hospice Care Barriers to Discharge: No Barriers Identified   Patient Goals and CMS Choice Patient states their goals for this hospitalization and ongoing recovery are:: home with hospice CMS Medicare.gov Compare Post Acute Care list provided to:: Patient Choice offered to / list presented to : Patient  Discharge Placement                       Discharge Plan and Services In-house Referral: Clinical Social Work,Hospice / Palliative Care Discharge Planning Services: CM Consult Post Acute Care Choice: Hospice          DME Arranged: N/A DME Agency: NA       HH Arranged: NA HH Agency: NA        Social Determinants of Health (SDOH) Interventions     Readmission Risk Interventions No flowsheet data found.

## 2020-06-03 NOTE — Progress Notes (Signed)
Received notification that pt has decided to stop dialysis and transition to hospice.   No further dialysis will be planned with last HD 06/02/20. We will sign off.  Call with questions anytime.  Madelon Lips MD [facility] pgr [phone]

## 2020-06-03 NOTE — Progress Notes (Signed)
Renal Navigator received update from CM/W. Estelle Grumbles (see note from today). Navigator updated Nephrologist/Dr. Hollie Salk on patient's decision to stop dialysis and also called patient's outpatient HD clinic/[facility] and spoke with Social Worker/Lisa to inform of patient's decision to stop HD, with her last HD treatment being yesterday, 4/4.  Navigator will fax supporting records from discharge to [facility] when available.   Alphonzo Cruise, [place] Renal Navigator [phone]

## 2020-06-03 NOTE — Progress Notes (Signed)
PT Cancellation Note  Patient Details Name: Alexis [name] MRN: [id number] DOB: 08/12/1950   Cancelled Treatment:    Reason Eval/Treat Not Completed: Patient declined, no reason specified continues to refuse PT due to pain in R LE, upset and tearful about her situation this morning. Listened supportively, discussed if she has any further needs PT can assist with and she politely declines- states she wants to focus on getting home with family at this point, feels she has enough support from family and all equipment needed at this point.    Windell Norfolk, DPT, PN1   Supplemental Physical Therapist [facility]    Pager [phone] Acute Rehab Office [phone]

## 2020-06-03 NOTE — Progress Notes (Signed)
[name] to be discharged Home per MD order. Discussed prescriptions and follow up appointments with the patient. Medication list explained in detail. Patient verbalized understanding.  Skin clean, dry and intact without evidence of skin break down, no evidence of skin tears noted. No complaints noted.  Patient left with R IJ HD cath and as per Dr. Hollie Salk she can be discharged home with it.  Dressing clean and dry.  An After Visit Summary (AVS) was printed and given to the patient. Patient discharged home with all her belongings (2 pillows, blanket, medications, blue plastic bag with some cloths, red bag, and a sling) via PTAR.  Husband will be receiving the patient.  Amaryllis Dyke, RN

## 2020-06-03 NOTE — TOC Transition Note (Signed)
Transition of Care [facility]) - CM/SW Discharge Note   Patient Details  Name: MARKETA MIDKIFF MRN: [id number] Date of Birth: 1950/07/14  Transition of Care [facility]) CM/SW Contact:  Bartholomew Crews, RN Phone Number: [phone] 06/03/2020, 11:11 AM   Clinical Narrative:     DC order/summary complete. PTAR arranged - transport paperwork on the chart. Spouse notified. [facility] notified. Spoke with Angie at [employer] about 20 hours of wrap around services - confirmed with HTA that patient can use her 20 hours to help support successful transition home. No further TOC needs identified.   Final next level of care: Home w Hospice Care Barriers to Discharge: No Barriers Identified   Patient Goals and CMS Choice Patient states their goals for this hospitalization and ongoing recovery are:: home with hospice CMS Medicare.gov Compare Post Acute Care list provided to:: Patient Choice offered to / list presented to : Patient  Discharge Placement                       Discharge Plan and Services In-house Referral: Clinical Social Work,Hospice / Palliative Care Discharge Planning Services: CM Consult Post Acute Care Choice: Hospice          DME Arranged: N/A DME Agency: NA       HH Arranged: NA HH Agency: NA        Social Determinants of Health (SDOH) Interventions     Readmission Risk Interventions No flowsheet data found.

## 2020-06-03 NOTE — Progress Notes (Signed)
Renal Navigator asked by floor RN/Jay whether patient's TDC needed to be removed prior to discharge. Navigator contacted Nephrologist/Dr. Hollie Salk who states, in this case, patient can be discharged with catheter in place and it does not need to be heparin locked prior to discharge. RN aware and appreciative.   Alphonzo Cruise, [place] Renal Navigator [phone]

## 2020-06-03 NOTE — Discharge Summary (Signed)
Physician Discharge Summary   Alexis [name] UXL:[id number] DOB: 03-Mar-1950 DOA: 05/28/2020  PCP: Mackie Pai, PA-C  Admit date: 05/28/2020 Discharge date:  06/03/2020  Admitted From: Home Disposition:  Home with Early  Discharging physician: Dwyane Dee, MD  Recommendations for Outpatient Follow-up:  1. Continue de-escalation of meds with further evaluation by hospice  Patient discharged to home in Discharge Condition: fair Risk of unplanned readmission score: Unplanned Admission- Pilot do not use: 28.05  CODE STATUS: DNR Diet recommendation:  Diet Orders (From admission, onward)    Start     Ordered   05/28/20 2029  Diet renal/carb modified with fluid restriction Diet-HS Snack? Nothing; Fluid restriction: 1200 mL Fluid; Room service appropriate? Yes; Fluid consistency: Thin  Diet effective now       Question Answer Comment  Diet-HS Snack? Nothing   Fluid restriction: 1200 mL Fluid   Room service appropriate? Yes   Fluid consistency: Thin      05/28/20 2028          Hospital Course: Alexis [name] is a 70 yo female with PMH PAD, recent R BKA, CAD, HTN, DMII, COPD, PAF (no longer on Eliquis 2/2  recent GIB and unclear if this was to be restarted after Hgb stabilized), ESRD-MWF HD, depression, cirrhosis, obesity, anxiety, osteoarthritis, hypothyroidism who was admitted with worsening pain in her recent R BKA site as well as significant difficulty mobilizing especially forgetting to her most recent dialysis session.  Family has had great difficulty with getting patient out of bed in order to transport her to dialysis.  Due to being unable to go to her session on 05/28/2020, she was instructed to call EMS for patient to be transferred to the hospital.  She has an extensive vascular history and was recently treated at [facility].  Recent discharge summaries have been reviewed from surgery as well as GI from a prior GI bleed.  She was taken off of Eliquis but  there is difficulty understanding if this was to be resumed with and/or without her plavix.    PT/OT were consulted on admission given her difficulty with mobility at home.  Patient and family are amenable for placement in rehab if recommended at time of discharge.  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  She was found to have a partially dehisced wound of her BKA.  She was placed in a dry dressing with Ace wrap and stump protector being ordered; also started on 10-day course of doxycycline.  She was instructed to keep staples in place and return for follow-up again in 2 weeks.  After multiple discussions during hospitalization, patient ultimately decided to discharge to her home with hospice in place.  She understands dialysis will be discontinued and comfort care would be focus.  She was offered placement in rehab multiple times and declined.  She was also evaluated by psychiatry and deemed to have capacity for decision-making.  Hospice was arranged with [facility] upon discharge and her dialysis center was also informed of her decision.   * SOB (shortness of breath)-resolved as of 05/30/2020 -Presumed due to volume overload  Physical deconditioning -Patient having significant difficulty with mobilization at home since surgery and family had great difficulty being able to transport patient to dialysis safely on 05/28/2020.  Rehab has now been declined by patient; this was offered multiple times and she also underwent psychiatry evaluation and was deemed  to have capacity for decision-making -After several discussions with patient and family members, the patient has ultimately decided to discontinue dialysis treatments and discharged home with hospice in place and focusing on comfort care  Wound infection - seen by ortho surgery prior to admission; partial dehiscence of right BKA.  Dry gauze and Ace wrap  placed.  Staples to remain in place per orthopedic surgery. -Continue 10-day course of doxycycline -Follow-up with orthopedic surgery in 2 weeks  ESRD on hemodialysis [facility]) -Patient missed outpatient session on 05/28/2020 due to difficulty with mobilization from her recent right BKA -See physical deconditioning discussion.  Patient wishing to discontinue dialysis completely at this point and discharging home with hospice  Permanent atrial fibrillation [facility]) - from discharge summary at [facility]: "Patient had extensive history of left femoral embolectomy and 4 compartment fasciotomies with right lower extremity bypass graft done in December 2021. Had been on Plavix and Eliquis which has been withheld because of acute GI bleed. Discussed with GI about anticoagulation, given patient received Eliquis on 04/21/2020 and also Plavix held. Vascular surgery consulted for follow-up. As per discussion with vascular surgery, no emergent surgical intervention needed at this time. Okay to hold off on antiplatelets/anticoagulants at this time but recommended to resume home medications including Plavix and Eliquis once her anemia is addressed." - given that her Hgb seems to be stable in the 8-9 g/dL range, will restart Eliquis again for now; if re-bleeds this likely will be discontinued -Decision for continuing medications left to patient and hospice  Peripheral vascular disease ([place]) -Prior notes reviewed under care everywhere -Continue Plavix  CAD (coronary artery disease) - not on asa 2/2 GIB hx and also is on plavix/eliquis - continue lipitor, plavix, Toprol; not on ACEi due to hx hyperK  COPD (chronic obstructive pulmonary disease) (HCC) -Continue Symbicort and oxygen  S/P BKA (below knee amputation), right ([place]) - s/p surgery on 05/07/20 - see wound infection  -Continue pain control.  Robaxin added for muscle spasms  Chronic systolic CHF (congestive heart failure) (HCC) - volume overloaded 2/2 ESRD -  continue chronic HD - last echo 1/0/2725: EF 36%, diastolic function unable to be evaluated due to A. fib.  LV global hypokinesis without RWMA  Other cirrhosis of liver (HCC) - asymptomatic; likely NASH - last U/S reviewed from 09/18/19: "Coarse echotexture of the liver consistent with cirrhosis/fatty Infiltration"  Volume overload - see above discussions   Hypothyroidism - s/p RAI ablation -Continue home Synthroid  HTN (hypertension) -Continue Toprol and midodrine    Principal Diagnosis: SOB (shortness of breath)  Discharge Diagnoses: Active Hospital Problems   Diagnosis Date Noted  . Physical deconditioning 05/29/2020    Priority: High  . Wound infection 05/28/2020    Priority: High  . ESRD on hemodialysis ([place]) 05/28/2020    Priority: Medium  . Permanent atrial fibrillation ([place]) 07/12/2014    Priority: Medium  . Peripheral vascular disease ([place]) 04/02/2013    Priority: Medium  . COPD (chronic obstructive pulmonary disease) ([place]) 05/29/2020  . CAD (coronary artery disease) 05/29/2020  . S/P BKA (below knee amputation), right ([place]) 05/28/2020  . Other cirrhosis of liver ([place]) 05/26/2019  . Chronic systolic CHF (congestive heart failure) ([place]) 05/26/2019  . Volume overload 08/25/2018  . Hypothyroidism 07/12/2014  . HTN (hypertension) 07/12/2014    Resolved Hospital Problems   Diagnosis Date Noted Date Resolved  . SOB (shortness of breath) 05/28/2020 05/30/2020    Discharge Instructions    Increase activity slowly   Complete by:  As directed      Allergies as of 06/03/2020      Reactions   Indomethacin Other (See Comments)   Renal Insufficiency   Pregabalin Other (See Comments)   DIZZINESS   Sertraline Nausea And Vomiting   Sulfa Antibiotics Hives   Clindamycin/lincomycin    Morphine And Related Nausea And Vomiting      Medication List    STOP taking these medications   allopurinol 100 MG tablet Commonly known as: ZYLOPRIM   AMBULATORY NON FORMULARY  MEDICATION   atorvastatin 10 MG tablet Commonly known as: LIPITOR   ergocalciferol 1.25 MG (50000 UT) capsule Commonly known as: VITAMIN D2   FreeStyle Libre 14 Day Sensor Misc   levocetirizine 5 MG tablet Commonly known as: XYZAL   multivitamin-prenatal 27-0.8 MG Tabs tablet   nystatin cream Commonly known as: MYCOSTATIN   torsemide 20 MG tablet Commonly known as: DEMADEX   triamcinolone ointment 0.1 % Commonly known as: KENALOG     TAKE these medications   acetaminophen 500 MG tablet Commonly known as: TYLENOL Take 500 mg by mouth as needed (pain).   albuterol 108 (90 Base) MCG/ACT inhaler Commonly known as: VENTOLIN HFA Inhale 2 puffs into the lungs every 6 (six) hours as needed for wheezing or shortness of breath.   apixaban 5 MG Tabs tablet Commonly known as: ELIQUIS Take 1 tablet (5 mg total) by mouth 2 (two) times daily.   clopidogrel 75 MG tablet Commonly known as: PLAVIX TAKE ONE TABLET BY MOUTH EVERY EVENING What changed: when to take this   doxycycline 100 MG tablet Commonly known as: VIBRA-TABS Take 1 tablet (100 mg total) by mouth every 12 (twelve) hours for 3 days.   fluticasone 50 MCG/ACT nasal spray Commonly known as: FLONASE Place 2 sprays into both nostrils at bedtime. What changed: when to take this   FreeStyle Libre 14 Day Reader Kerrin Mo Use as directed to monitor BG.  Dx E11.65   Insulin Aspart FlexPen 100 UNIT/ML Sopn Inject 0-12 Units into the skin 4 (four) times daily -  before meals and at bedtime. Sliding scale: 201 - 250 = 2 units 251 - 300 = 4 units 301 - 350 = 6 units 351 - 400 = 8 units 401 - 450 = 10 units 451 - 500 = 12 units Notify MD if > 500   insulin degludec 100 UNIT/ML FlexTouch Pen Commonly known as: TRESIBA Inject 10 Units into the skin at bedtime. What changed: how much to take   levothyroxine 25 MCG tablet Commonly known as: SYNTHROID Take 12.5 mcg by mouth daily before breakfast. Take with 166mg tablet to  equal 162.576m daily.   levothyroxine 150 MCG tablet Commonly known as: SYNTHROID Take 150 mcg by mouth daily before breakfast. Take with 12.5m56m(52m65mablet cut in half) to equal 162.5mcg11mily.   lidocaine 4 % external solution Commonly known as: XYLOCAINE Apply topically.   Melatonin 10 MG Tabs Take 20 mg by mouth at bedtime.   methocarbamol 500 MG tablet Commonly known as: ROBAXIN 1 tab po bid What changed:   how much to take  how to take this  when to take this  additional instructions   metoprolol succinate 25 MG 24 hr tablet Commonly known as: TOPROL-XL TAKE ONE (1) TABLET BY MOUTH EVERY DAY What changed:   how much to take  how to take this  when to take this  additional instructions   midodrine 5 MG tablet Commonly known as: PROAMATINE Take  5 mg by mouth 3 (three) times daily.   ondansetron 4 MG disintegrating tablet Commonly known as: ZOFRAN-ODT Take 4 mg by mouth every 8 (eight) hours as needed for nausea or vomiting.   OneTouch Verio test strip Generic drug: glucose blood 3 (three) times daily.   oxyCODONE 5 MG immediate release tablet Commonly known as: Oxy IR/ROXICODONE Take 5 mg by mouth every 6 (six) hours as needed for pain.   PEN NEEDLES 31GX5/16" 31G X 8 MM Misc Use as needed 4 times/day.  Dx E11.65   phenazopyridine 100 MG tablet Commonly known as: PYRIDIUM Take 100 mg by mouth 3 (three) times daily as needed (dysuria).   pregabalin 25 MG capsule Commonly known as: LYRICA Take 25 mg by mouth every 12 (twelve) hours.   Probiotic Caps Take 2 capsules by mouth daily. 50 billion (gummy)   RABEprazole 20 MG tablet Commonly known as: Aciphex Take 1 tablet (20 mg total) by mouth daily. What changed: when to take this   Symbicort 160-4.5 MCG/ACT inhaler Generic drug: budesonide-formoterol INHALE 2 PUFFS BY MOUTH INTO THE LUNGS 2 (TWO) TIMES DAILY.   tobramycin 0.3 % ophthalmic solution Commonly known as: TOBREX PLACE 2  DROPS INTO BOTH EYES EVERY 6 (SIX) HOURS.   venlafaxine XR 37.5 MG 24 hr capsule Commonly known as: Effexor XR Take 1 capsule (37.5 mg total) by mouth daily with breakfast.       Allergies  Allergen Reactions  . Indomethacin Other (See Comments)    Renal Insufficiency  . Pregabalin Other (See Comments)    DIZZINESS   . Sertraline Nausea And Vomiting  . Sulfa Antibiotics Hives  . Clindamycin/Lincomycin   . Morphine And Related Nausea And Vomiting    Consultations: Nephrology Psychiatry  Discharge Exam: BP (!) 131/42 (BP Location: Right Arm)   Pulse 62   Temp 98.2 F (36.8 C) (Oral)   Resp 16   Ht 5' 2"  (1.575 m)   Wt 91.8 kg   SpO2 98%   BMI 37.02 kg/m  General appearance: alert, cooperative and no distress Head: Normocephalic, without obvious abnormality, atraumatic Eyes: EOMI Lungs: coarse breath sounds  Chest: left Perm cath in place Heart: irregularly irregular rhythm and S1, S2 normal Abdomen: obese, soft, NT, ND, BS present Extremities: R BKA noted with dressing in place and ACE wrap. 2-3+ LE edema up to thighs Skin: mobility and turgor normal Neurologic: Grossly normal  The results of significant diagnostics from this hospitalization (including imaging, microbiology, ancillary and laboratory) are listed below for reference.   Microbiology: Recent Results (from the past 240 hour(s))  Resp Panel by RT-PCR (Flu A&B, Covid) Nasopharyngeal Swab     Status: None   Collection Time: 05/28/20  2:06 PM   Specimen: Nasopharyngeal Swab; Nasopharyngeal(NP) swabs in vial transport medium  Result Value Ref Range Status   SARS Coronavirus 2 by RT PCR NEGATIVE NEGATIVE Final    Comment: (NOTE) SARS-CoV-2 target nucleic acids are NOT DETECTED.  The SARS-CoV-2 RNA is generally detectable in upper respiratory specimens during the acute phase of infection. The lowest concentration of SARS-CoV-2 viral copies this assay can detect is 138 copies/mL. A negative result does  not preclude SARS-Cov-2 infection and should not be used as the sole basis for treatment or other patient management decisions. A negative result may occur with  improper specimen collection/handling, submission of specimen other than nasopharyngeal swab, presence of viral mutation(s) within the areas targeted by this assay, and inadequate number of viral copies(<138 copies/mL). A  negative result must be combined with clinical observations, patient history, and epidemiological information. The expected result is Negative.  Fact Sheet for Patients:  EntrepreneurPulse.com.au  Fact Sheet for Healthcare Providers:  IncredibleEmployment.be  This test is no t yet approved or cleared by the [place] FDA and  has been authorized for detection and/or diagnosis of SARS-CoV-2 by FDA under an Emergency Use Authorization (EUA). This EUA will remain  in effect (meaning this test can be used) for the duration of the COVID-19 declaration under Section 564(b)(1) of the Act, 21 U.S.C.section 360bbb-3(b)(1), unless the authorization is terminated  or revoked sooner.       Influenza A by PCR NEGATIVE NEGATIVE Final   Influenza B by PCR NEGATIVE NEGATIVE Final    Comment: (NOTE) The Xpert Xpress SARS-CoV-2/FLU/RSV plus assay is intended as an aid in the diagnosis of influenza from Nasopharyngeal swab specimens and should not be used as a sole basis for treatment. Nasal washings and aspirates are unacceptable for Xpert Xpress SARS-CoV-2/FLU/RSV testing.  Fact Sheet for Patients: EntrepreneurPulse.com.au  Fact Sheet for Healthcare Providers: IncredibleEmployment.be  This test is not yet approved or cleared by the [place] FDA and has been authorized for detection and/or diagnosis of SARS-CoV-2 by FDA under an Emergency Use Authorization (EUA). This EUA will remain in effect (meaning this test can be used) for the  duration of the COVID-19 declaration under Section 564(b)(1) of the Act, 21 U.S.C. section 360bbb-3(b)(1), unless the authorization is terminated or revoked.  Performed at [facility], [place] [address] [place], [place] [address]   MRSA PCR Screening     Status: None   Collection Time: 05/28/20  8:29 PM   Specimen: Nasopharyngeal  Result Value Ref Range Status   MRSA by PCR NEGATIVE NEGATIVE Final    Comment:        The GeneXpert MRSA Assay (FDA approved for NASAL specimens only), is one component of a comprehensive MRSA colonization surveillance program. It is not intended to diagnose MRSA infection nor to guide or monitor treatment for MRSA infections. Performed at [facility], [place] [address] [place], [place] [address]      Labs: BNP (last 3 results) Recent Labs    04/11/20 1607  BNP 64   Basic Metabolic Panel: Recent Labs  Lab 05/30/20 0306 05/31/20 0256 06/01/20 0419 06/02/20 0822 06/03/20 0224  NA 127* 128* 126* 128* 132*  K 3.7 3.7 3.7 4.0 4.1  CL 93* 96* 94* 93* 99  CO2 27 23 25 27 24   GLUCOSE 93 134* 117* 152* 124*  BUN 24* 18 22 25* 14  CREATININE 1.75* 1.45* 1.65* 1.52* 1.04*  CALCIUM 8.1* 8.3* 8.4* 9.0 8.4*  MG 2.0 1.9 1.8 1.8 1.7  PHOS 3.4 2.4* 3.2 3.0 2.2*   Liver Function Tests: Recent Labs  Lab 05/28/20 1406 05/30/20 0306  AST 57*  --   ALT 14  --   ALKPHOS 208*  --   BILITOT 1.6*  --   PROT 6.3*  --   ALBUMIN 2.1* 2.0*   No results for input(s): LIPASE, AMYLASE in the last 168 hours. No results for input(s): AMMONIA in the last 168 hours. CBC: Recent Labs  Lab 05/28/20 1457 05/29/20 0454 05/30/20 0306 05/31/20 0256 06/01/20 0419 06/02/20 0822  WBC 5.0 4.7 5.1 5.4 4.8 4.7  NEUTROABS 3.3  --   --  3.4 3.0 3.2  HGB 8.5* 8.2* 7.8* 8.3* 8.4* 8.6*  HCT 27.3* 27.2* 24.9* 27.4* 27.0* 27.3*  MCV 95.1 96.1 94.3 96.1 95.1 94.8  PLT PLATELET CLUMPS NOTED ON SMEAR, UNABLE TO ESTIMATE 106* 115* 115* 103* 108*   Cardiac  Enzymes: No results for input(s): CKTOTAL, CKMB, CKMBINDEX, TROPONINI in the last 168 hours. BNP: Invalid input(s): POCBNP CBG: Recent Labs  Lab 06/02/20 0646 06/02/20 1210 06/02/20 1818 06/02/20 2101 06/03/20 0655  GLUCAP 117* 120* 107* 118* 107*   D-Dimer No results for input(s): DDIMER in the last 72 hours. Hgb A1c No results for input(s): HGBA1C in the last 72 hours. Lipid Profile No results for input(s): CHOL, HDL, LDLCALC, TRIG, CHOLHDL, LDLDIRECT in the last 72 hours. Thyroid function studies No results for input(s): TSH, T4TOTAL, T3FREE, THYROIDAB in the last 72 hours.  Invalid input(s): FREET3 Anemia work up No results for input(s): VITAMINB12, FOLATE, FERRITIN, TIBC, IRON, RETICCTPCT in the last 72 hours. Urinalysis    Component Value Date/Time   COLORURINE AMBER (A) 06/01/2020 0923   APPEARANCEUR TURBID (A) 06/01/2020 0923   LABSPEC 1.013 06/01/2020 0923   PHURINE 5.0 06/01/2020 0923   GLUCOSEU NEGATIVE 06/01/2020 0923   HGBUR MODERATE (A) 06/01/2020 0923   BILIRUBINUR NEGATIVE 06/01/2020 0923   BILIRUBINUR Negative 03/11/2020 1518   KETONESUR 5 (A) 06/01/2020 0923   PROTEINUR 30 (A) 06/01/2020 0923   UROBILINOGEN 0.2 03/11/2020 1518   NITRITE NEGATIVE 06/01/2020 0923   LEUKOCYTESUR LARGE (A) 06/01/2020 0923   Sepsis Labs Invalid input(s): PROCALCITONIN,  WBC,  LACTICIDVEN Microbiology Recent Results (from the past 240 hour(s))  Resp Panel by RT-PCR (Flu A&B, Covid) Nasopharyngeal Swab     Status: None   Collection Time: 05/28/20  2:06 PM   Specimen: Nasopharyngeal Swab; Nasopharyngeal(NP) swabs in vial transport medium  Result Value Ref Range Status   SARS Coronavirus 2 by RT PCR NEGATIVE NEGATIVE Final    Comment: (NOTE) SARS-CoV-2 target nucleic acids are NOT DETECTED.  The SARS-CoV-2 RNA is generally detectable in upper respiratory specimens during the acute phase of infection. The lowest concentration of SARS-CoV-2 viral copies this assay can  detect is 138 copies/mL. A negative result does not preclude SARS-Cov-2 infection and should not be used as the sole basis for treatment or other patient management decisions. A negative result may occur with  improper specimen collection/handling, submission of specimen other than nasopharyngeal swab, presence of viral mutation(s) within the areas targeted by this assay, and inadequate number of viral copies(<138 copies/mL). A negative result must be combined with clinical observations, patient history, and epidemiological information. The expected result is Negative.  Fact Sheet for Patients:  EntrepreneurPulse.com.au  Fact Sheet for Healthcare Providers:  IncredibleEmployment.be  This test is no t yet approved or cleared by the [place] FDA and  has been authorized for detection and/or diagnosis of SARS-CoV-2 by FDA under an Emergency Use Authorization (EUA). This EUA will remain  in effect (meaning this test can be used) for the duration of the COVID-19 declaration under Section 564(b)(1) of the Act, 21 U.S.C.section 360bbb-3(b)(1), unless the authorization is terminated  or revoked sooner.       Influenza A by PCR NEGATIVE NEGATIVE Final   Influenza B by PCR NEGATIVE NEGATIVE Final    Comment: (NOTE) The Xpert Xpress SARS-CoV-2/FLU/RSV plus assay is intended as an aid in the diagnosis of influenza from Nasopharyngeal swab specimens and should not be used as a sole basis for treatment. Nasal washings and aspirates are unacceptable for Xpert Xpress SARS-CoV-2/FLU/RSV testing.  Fact Sheet for Patients: EntrepreneurPulse.com.au  Fact Sheet for Healthcare Providers: IncredibleEmployment.be  This test is not yet approved or cleared by the [place] and has been authorized for detection and/or diagnosis of SARS-CoV-2 by FDA under an Emergency Use Authorization (EUA). This EUA will remain in  effect (meaning this test can be used) for the duration of the COVID-19 declaration under Section 564(b)(1) of the Act, 21 U.S.C. section 360bbb-3(b)(1), unless the authorization is terminated or revoked.  Performed at [facility], [place] [address], [place], [place] [address]   MRSA PCR Screening     Status: None   Collection Time: 05/28/20  8:29 PM   Specimen: Nasopharyngeal  Result Value Ref Range Status   MRSA by PCR NEGATIVE NEGATIVE Final    Comment:        The GeneXpert MRSA Assay (FDA approved for NASAL specimens only), is one component of a comprehensive MRSA colonization surveillance program. It is not intended to diagnose MRSA infection nor to guide or monitor treatment for MRSA infections. Performed at [facility], [place] [address], [place],  [address]     Procedures/Studies: DG Chest Portable 1 View  Result Date: 05/28/2020 CLINICAL DATA:  Shortness of breath EXAM: PORTABLE CHEST 1 VIEW COMPARISON:  May 06, 2020 FINDINGS: The heart size and mediastinal contours are mildly enlarged. A right-sided central venous catheter seen with the tip at the right atrium. Aortic knob calcifications are seen. A left-sided pacemaker seen with the lead tips in the right atrium right ventricle. There is mild prominence of the central pulmonary vasculature. No large airspace consolidation or pleural effusion. No acute osseous abnormality. IMPRESSION: Mild cardiomegaly and pulmonary vascular congestion. Electronically Signed   By: Prudencio Pair M.D.   On: 05/28/2020 14:49     Time coordinating discharge: Over 30 minutes    Dwyane Dee, MD  [facility] 06/03/2020, 10:43 AM

## 2020-06-05 ENCOUNTER — Telehealth: Payer: Self-pay | Admitting: *Deleted

## 2020-06-05 NOTE — Telephone Encounter (Signed)
Caller Name [place] Phone Number [phone] Patient Name Alexis [name] Patient DOB 21-May-1950 Call Type Message Only Information Provided Reason for Call Request for General Office Information Initial Comment Caller states that she received a call from the office reminding her of an appt tomorrow however she is now under the care of Hospice and she is unable to make that appt. Additional Comment Caller states that she is under hospice care and unable to make the appt. Office hours provided. Disp. Time Disposition Final User 06/05/2020 10:13:22 AM General Information Provided Yes Virgil Benedict

## 2020-06-10 ENCOUNTER — Encounter: Payer: Self-pay | Admitting: Medical

## 2020-06-10 ENCOUNTER — Telehealth: Payer: Self-pay | Admitting: Medical

## 2020-06-10 ENCOUNTER — Other Ambulatory Visit: Payer: Self-pay

## 2020-06-10 ENCOUNTER — Telehealth (INDEPENDENT_AMBULATORY_CARE_PROVIDER_SITE_OTHER): Payer: HMO | Admitting: Medical

## 2020-06-10 VITALS — HR 84

## 2020-06-10 DIAGNOSIS — D500 Iron deficiency anemia secondary to blood loss (chronic): Secondary | ICD-10-CM | POA: Diagnosis not present

## 2020-06-10 DIAGNOSIS — F32A Depression, unspecified: Secondary | ICD-10-CM

## 2020-06-10 DIAGNOSIS — K7469 Other cirrhosis of liver: Secondary | ICD-10-CM

## 2020-06-10 DIAGNOSIS — E11621 Type 2 diabetes mellitus with foot ulcer: Secondary | ICD-10-CM | POA: Diagnosis not present

## 2020-06-10 DIAGNOSIS — L97509 Non-pressure chronic ulcer of other part of unspecified foot with unspecified severity: Secondary | ICD-10-CM | POA: Diagnosis not present

## 2020-06-10 DIAGNOSIS — J439 Emphysema, unspecified: Secondary | ICD-10-CM | POA: Diagnosis not present

## 2020-06-10 DIAGNOSIS — Z794 Long term (current) use of insulin: Secondary | ICD-10-CM | POA: Diagnosis not present

## 2020-06-10 DIAGNOSIS — I5022 Chronic systolic (congestive) heart failure: Secondary | ICD-10-CM

## 2020-06-10 MED ORDER — ONDANSETRON 4 MG PO TBDP: 4.0000 mg | ORAL_TABLET | Freq: Three times a day (TID) | ORAL | 1 refills | Status: AC | PRN

## 2020-06-10 NOTE — Telephone Encounter (Signed)
Documents placed in edward's basket

## 2020-06-10 NOTE — Patient Instructions (Signed)
Patient had video visit today.  She is now in hospice and has multiple severe comorbidities.  None these are atrial fibrillation, asthma, CHF, cirrhosis, COPD, diabetes and end-stage renal disease.  Hospice is now managing all her medications.  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  Hospice has her on morphine presently for pain.  He seems to be adequate.  Patient overall appears stable presently.  She does report some dysuria since coming home from the hospital.  Hospice nurse is going to collect urinalysis today and send it out for culture.  Today we will treat according to culture per patient.  Patient has some nausea associated with medications she is taking.  Particularly she thinks when she takes morphine tends to make her nauseous.  She requests prescription of Zofran.  Prescription was sent to patient's pharmacy.  Advised patient we can do periodic video visits or she can send me messages regarding any questions regarding hospice care.  Follow-up as needed.

## 2020-06-10 NOTE — Telephone Encounter (Signed)
Document faxed in office for provider to fill out (FMLA 8 pages (4 pages copies)) Document put at front office tray under providers name.

## 2020-06-10 NOTE — Progress Notes (Signed)
Subjective:    Patient ID: Alexis [name], female    DOB: 04/13/1950, 70 y.o.   MRN: [id number]  HPI  Virtual Visit via Video Note  I connected with Alexis [name] on 06/10/20 at 10:40 AM EDT by a video enabled telemedicine application and verified that I am speaking with the correct person using two identifiers.  Location: Patient: home Provider: office  participants- pt and myself.     I discussed the limitations of evaluation and management by telemedicine and the availability of in person appointments. The patient expressed understanding and agreed to proceed.  History of Present Illness: Hospice is now coming out. Pt has friend staying with her. Pt states not depressed and she is coming to terms with current health conditions and potential death.  Hospice nurse is coming out twice a week and CNA 3 times a week.    Pt has chf, cirrhosis, diabetes, PAF, recent amputations, ckd(esrd), chronic pain from amputation and copd. Hospice managing meds now.  On DC from hospital she came home directly.  Hospice nurse will collect urine for culture today since she has some dysuria.  Pt is having some stump pain post hospitalization.  Pt states hospice has made a lot changes to her meds.  They made adjustment to her lasix. They recently decreased to lasix now just on lasix 20 mg daily.  Pt 02 sats is 92%.  Pt is getting some nausea about 1-2 times a day. Sometimes with pain meds.   Observations/Objective:  General-no acute distress, pleasant, oriented. Lungs- on inspection lungs appear unlabored. Neck- no tracheal deviation or jvd on inspection. Neuro- gross motor function appears intact.    Assessment and Plan: Patient had video visit today.  She is now in hospice and has multiple severe comorbidities.  None these are atrial fibrillation, asthma, CHF, cirrhosis, COPD, diabetes and end-stage renal disease.  Hospice is now managing all her medications.  She has chronic  pain related to amputation and lower extremity stump.  Hospice has her on morphine presently for pain.  He seems to be adequate.  Patient overall appears stable presently.  She does report some dysuria since coming home from the hospital.  Hospice nurse is going to collect urinalysis today and send it out for culture.  Today we will treat according to culture per patient.  Patient has some nausea associated with medications she is taking.  Particularly she thinks when she takes morphine tends to make her nauseous.  She requests prescription of Zofran.  Prescription was sent to patient's pharmacy.  Advised patient we can do periodic video visits or she can send me messages regarding any questions regarding hospice care.  Follow-up as needed.  Follow Up Instructions:    I discussed the assessment and treatment plan with the patient. The patient was provided an opportunity to ask questions and all were answered. The patient agreed with the plan and demonstrated an understanding of the instructions.   The patient was advised to call back or seek an in-person evaluation if the symptoms worsen or if the condition fails to improve as anticipated.  Time spent with patient today was 33 minutes which consisted of chart review, discussing diagnoses, hospice care, treatment and documentation.   Mackie Pai, PA-C   Review of Systems  Constitutional: Negative for chills, fatigue and fever.  HENT: Negative for congestion, drooling, facial swelling and nosebleeds.   Respiratory: Negative for cough, chest tightness, shortness of breath and wheezing.   Cardiovascular: Negative for  chest pain and palpitations.  Gastrointestinal: Negative for abdominal pain, blood in stool, constipation, diarrhea and nausea.  Genitourinary: Negative for dysuria, flank pain and frequency.  Musculoskeletal: Negative for back pain.  Neurological: Negative for dizziness, syncope, weakness, numbness and headaches.   Hematological: Negative for adenopathy. Does not bruise/bleed easily.  Psychiatric/Behavioral: Negative for behavioral problems and decreased concentration.     Past Medical History:  Diagnosis Date  . Allergy   . Anxiety   . Arthritis   . Asthma   . Atrial fibrillation ([place])   . CHF (congestive heart failure) ([place])   . Cirrhosis ([place])   . COPD (chronic obstructive pulmonary disease) ([place])   . Depression 05/26/2019  . Diabetes mellitus without complication ([place])   . ESRD (end stage renal disease) on dialysis ([place])   . ESRD on hemodialysis ([place]) 05/28/2020  . Hyperlipidemia   . Hypertension   . Macrocytic anemia 09/04/2018  . Myocardial infarction ([place])    several  . Pacemaker    CRT-P with RV lead and His Bundle lead ( high threshold)   . Peripheral neuropathy   . Pneumonia   . PONV (postoperative nausea and vomiting)    unsure of exactly what happened at [facility] in 2010 (knee surgery) that led to crital care  . Thyroid disease      Social History   Socioeconomic History  . Marital status: Married    Spouse name: Juanda Crumble  . Number of children: 1  . Years of education: Not on file  . Highest education level: Not on file  Occupational History  . 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 Smoking status: Former Smoker    Types: Cigarettes    Quit date: 03/01/1998    Years since quitting: 22.2  . Smokeless tobacco: Never Used  Vaping Use  . Vaping Use: Never used  Substance and Sexual Activity  . 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  . Drug use: Not Currently    Types: Marijuana    Comment: remote use  . Sexual activity: Never  Other Topics Concern  . 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 Difficulty of Paying Living Expenses: Not very hard  Food Insecurity: No Food Insecurity  . Worried About Charity fundraiser in the Last Year: Never true  . Ran Out of Food in the Last Year: Never true  Transportation Needs: No Transportation Needs  . Lack of Transportation (Medical): No  . Lack of Transportation (Non-Medical): No  Physical Activity: Not on file  Stress: Not on file  Social Connections: Not on file  Intimate Partner Violence: Not on file    Past Surgical History:  Procedure Laterality Date  . ABDOMINAL AORTOGRAM W/LOWER EXTREMITY N/A 04/29/2016   Procedure: Abdominal Aortogram w/Lower Extremity;  Surgeon: Waynetta Sandy, MD;  Location: [place] [facility];  Service: Cardiovascular;  Laterality: N/A;  . ABDOMINAL AORTOGRAM W/LOWER EXTREMITY N/A 05/06/2017   Procedure: ABDOMINAL AORTOGRAM W/LOWER EXTREMITY;  Surgeon: Scot Dock,  Judeth Cornfield, MD;  Location: [place] [facility];  Service: Cardiovascular;  Laterality: N/A;  bilateral  . ABDOMINAL AORTOGRAM W/LOWER EXTREMITY Bilateral 05/04/2019   Procedure: ABDOMINAL AORTOGRAM W/LOWER EXTREMITY;  Surgeon: Angelia Mould, MD;  Location: [place] [facility];  Service: Cardiovascular;  Laterality: Bilateral;  . ABDOMINAL AORTOGRAM W/LOWER EXTREMITY Left 08/27/2019   Procedure: ABDOMINAL AORTOGRAM W/LOWER EXTREMITY;  Surgeon: Waynetta Sandy, MD;  Location: [place] [facility];  Service: Cardiovascular;  Laterality: Left;  . ABDOMINAL HYSTERECTOMY    . AMPUTATION Right 07/30/2016   Procedure: AMPUTATION RIGHT SECOND TOE;  Surgeon: Angelia Mould, MD;  Location: [facility];  Service: Vascular;  Laterality: Right;  . CARDIAC CATHETERIZATION     2005 at [place] N/A 12/25/2018   Procedure: COLONOSCOPY WITH  PROPOFOL;  Surgeon: Jackquline Denmark, MD;  Location: [facility];  Service: Endoscopy;  Laterality: N/A;  . ESOPHAGEAL BANDING  12/25/2018   Procedure: ESOPHAGEAL BANDING;  Surgeon: Jackquline Denmark, MD;  Location: [facility];  Service: Endoscopy;;  . ESOPHAGEAL BANDING  05/01/2019   Procedure: ESOPHAGEAL BANDING;  Surgeon: Jackquline Denmark, MD;  Location: [facility];  Service: Endoscopy;;  . ESOPHAGOGASTRODUODENOSCOPY (EGD) WITH PROPOFOL N/A 12/25/2018   Procedure: ESOPHAGOGASTRODUODENOSCOPY (EGD) WITH PROPOFOL;  Surgeon: Jackquline Denmark, MD;  [facility];  Service: Endoscopy;  Laterality: N/A;  . ESOPHAGOGASTRODUODENOSCOPY (EGD) WITH PROPOFOL N/A 05/01/2019   Procedure: ESOPHAGOGASTRODUODENOSCOPY (EGD) WITH PROPOFOL;  Surgeon: Jackquline Denmark, MD;  [facility];  Service: Endoscopy;  Laterality: N/A;  . LOWER EXTREMITY ANGIOGRAPHY Right 05/14/2019   Procedure: LOWER EXTREMITY ANGIOGRAPHY;  Surgeon: Waynetta Sandy, MD;  Location: [place] [facility];  Service: Cardiovascular;  Laterality: Right;  . PERIPHERAL VASCULAR ATHERECTOMY Right 04/29/2016   Procedure: Peripheral Vascular Atherectomy-Right Popliteal;  Surgeon: Waynetta Sandy, MD;  Location: [place] [facility];  Service: Cardiovascular;  Laterality: Right;  . PERIPHERAL VASCULAR ATHERECTOMY Right 05/14/2019   Procedure: PERIPHERAL VASCULAR ATHERECTOMY;  Surgeon: Waynetta Sandy, MD;  Location: [place] [facility];  Service: Cardiovascular;  Laterality: Right;  right SFA/pop  . PERIPHERAL VASCULAR BALLOON ANGIOPLASTY Left 08/27/2019   Procedure: PERIPHERAL VASCULAR BALLOON ANGIOPLASTY;  Surgeon: Waynetta Sandy, MD;  Location: [place];  Service: Cardiovascular;  Laterality: Left;  popliteal  . PERIPHERAL VASCULAR INTERVENTION Right 04/29/2016   Procedure: Peripheral Vascular Intervention-Right Popliteal;  Surgeon: Waynetta Sandy, MD;  Location: [facility];  Service:  Cardiovascular;  Laterality: Right;  POPLITEAL PTA  . PERIPHERAL VASCULAR INTERVENTION Right 05/14/2019   Procedure: PERIPHERAL VASCULAR INTERVENTION;  Surgeon: Waynetta Sandy, MD;  Location: [place];  Service: Cardiovascular;  Laterality: Right;  popliteal stent  . RADIOFREQUENCY ABLATION    . TOTAL KNEE ARTHROPLASTY    . TUBAL LIGATION      Family History  Problem Relation Age of Onset  . Diabetes Mother   . Hyperlipidemia Mother   . Diabetes Father   . Hyperlipidemia Father   . Heart disease Father        before age 49  . Heart attack Father   . Diabetes Brother   . Hyperlipidemia Brother   . Heart attack Brother     Allergies  Allergen Reactions  . Indomethacin Other (See Comments)    Renal Insufficiency  . Pregabalin Other (See Comments)    DIZZINESS   . Sertraline Nausea And Vomiting  . Sulfa Antibiotics Hives  . Clindamycin/Lincomycin   .  Morphine And Related Nausea And Vomiting    Current Outpatient Medications on File Prior to Visit  Medication Sig Dispense Refill  . acetaminophen (TYLENOL) 500 MG tablet Take 500 mg by mouth as needed (pain).    Marland Kitchen albuterol (VENTOLIN HFA) 108 (90 Base) MCG/ACT inhaler Inhale 2 puffs into the lungs every 6 (six) hours as needed for wheezing or shortness of breath.    Marland Kitchen apixaban (ELIQUIS) 5 MG TABS tablet Take 1 tablet (5 mg total) by mouth 2 (two) times daily. 60 tablet 3  . budesonide-formoterol (SYMBICORT) 160-4.5 MCG/ACT inhaler INHALE 2 PUFFS BY MOUTH INTO THE LUNGS 2 (TWO) TIMES DAILY. 10.2 g 3  . clopidogrel (PLAVIX) 75 MG tablet TAKE ONE TABLET BY MOUTH EVERY EVENING (Patient taking differently: Take 75 mg by mouth daily.) 90 tablet 3  . Continuous Blood Gluc Receiver (FREESTYLE LIBRE 14 DAY READER) DEVI Use as directed to monitor BG.  Dx E11.65    . fluticasone (FLONASE) 50 MCG/ACT nasal spray Place 2 sprays into both nostrils at bedtime. (Patient taking differently: Place 2 sprays into both nostrils in  the morning and at bedtime.) 16 g 3  . Insulin Aspart FlexPen 100 UNIT/ML SOPN Inject 0-12 Units into the skin 4 (four) times daily -  before meals and at bedtime. Sliding scale: 201 - 250 = 2 units 251 - 300 = 4 units 301 - 350 = 6 units 351 - 400 = 8 units 401 - 450 = 10 units 451 - 500 = 12 units Notify MD if > 500    . insulin degludec (TRESIBA) 100 UNIT/ML FlexTouch Pen Inject 10 Units into the skin at bedtime.    . Insulin Pen Needle (PEN NEEDLES 31GX5/16") 31G X 8 MM MISC Use as needed 4 times/day.  Dx E11.65    . levothyroxine (SYNTHROID) 150 MCG tablet Take 150 mcg by mouth daily before breakfast. Take with 12.62mg (2359m tablet cut in half) to equal 162.59m27mdaily.    . lMarland Kitchenvothyroxine (SYNTHROID) 25 MCG tablet Take 12.5 mcg by mouth daily before breakfast. Take with 150m59mablet to equal 162.59mcg76mily.    . lidMarland Kitchencaine (XYLOCAINE) 4 % external solution Apply topically.    . Melatonin 10 MG TABS Take 20 mg by mouth at bedtime.     . methocarbamol (ROBAXIN) 500 MG tablet 1 tab po bid (Patient taking differently: Take 500 mg by mouth 2 (two) times daily.) 30 tablet 0  . metoprolol succinate (TOPROL-XL) 25 MG 24 hr tablet TAKE ONE (1) TABLET BY MOUTH EVERY DAY (Patient taking differently: Take 25 mg by mouth daily. Hold for SBP <120 or HR <60) 90 tablet 1  . midodrine (PROAMATINE) 5 MG tablet Take 5 mg by mouth 3 (three) times daily.    . ondansetron (ZOFRAN-ODT) 4 MG disintegrating tablet Take 4 mg by mouth every 8 (eight) hours as needed for nausea or vomiting.     . ONEGlory RosebushO test strip 3 (three) times daily.    . oxyMarland KitchenODONE (OXY IR/ROXICODONE) 5 MG immediate release tablet Take 5 mg by mouth every 6 (six) hours as needed for pain.    . phenazopyridine (PYRIDIUM) 100 MG tablet Take 100 mg by mouth 3 (three) times daily as needed (dysuria).    . pregabalin (LYRICA) 25 MG capsule Take 25 mg by mouth every 12 (twelve) hours.    . Probiotic CAPS Take 2 capsules by mouth daily. 50  billion (gummy)    . RABEprazole (ACIPHEX) 20 MG tablet Take 1  tablet (20 mg total) by mouth daily. (Patient taking differently: Take 20 mg by mouth 2 (two) times daily.) 30 tablet 6  . tobramycin (TOBREX) 0.3 % ophthalmic solution PLACE 2 DROPS INTO BOTH EYES EVERY 6 (SIX) HOURS. 5 mL 0  . venlafaxine XR (EFFEXOR XR) 37.5 MG 24 hr capsule Take 1 capsule (37.5 mg total) by mouth daily with breakfast. 30 capsule 3   No current facility-administered medications on file prior to visit.    There were no vitals taken for this visit.      Objective:   Physical Exam        Assessment & Plan:

## 2020-06-12 ENCOUNTER — Telehealth: Payer: Self-pay | Admitting: Medical

## 2020-06-12 NOTE — Telephone Encounter (Signed)
Opened to review 

## 2020-06-17 DIAGNOSIS — E875 Hyperkalemia: Secondary | ICD-10-CM | POA: Diagnosis not present

## 2020-06-19 DIAGNOSIS — E875 Hyperkalemia: Secondary | ICD-10-CM | POA: Diagnosis not present

## 2020-06-23 ENCOUNTER — Telehealth: Payer: Self-pay | Admitting: Medical

## 2020-06-23 NOTE — Telephone Encounter (Signed)
Patient's daughter has FMLA form for me to fill out.  Is daughter's name is Lara Mulch.  [place] DPR form?  I need some time to fill out this form since it is quite lengthy.  Would you schedule office visit with Lorilynn and have daughter there at the time of the interview.  I will fill out the form at that time.  To be on the safe side if you would give me 40-minute in the event center has a lot of questions about her various medical problems.

## 2020-06-23 NOTE — Telephone Encounter (Signed)
Called pt and unable to lvm , sent mychart message

## 2020-06-26 ENCOUNTER — Telehealth: Payer: Self-pay | Admitting: Medical

## 2020-06-26 DIAGNOSIS — Z0279 Encounter for issue of other medical certificate: Secondary | ICD-10-CM

## 2020-06-26 NOTE — Telephone Encounter (Addendum)
I fille out pt fmla form after hours at home. Please make sure to charge for form.   Will you give to you tomorrow.Please fax and keep copy.

## 2020-06-27 NOTE — Telephone Encounter (Signed)
Form faxed to [place] at [phone]

## 2020-06-27 NOTE — Telephone Encounter (Signed)
Called Almyra Free ( daughter) to get fax number , states she will call me back

## 2020-07-01 DIAGNOSIS — E875 Hyperkalemia: Secondary | ICD-10-CM | POA: Diagnosis not present

## 2020-07-08 ENCOUNTER — Ambulatory Visit: Payer: HMO | Admitting: Pharmacist

## 2020-07-08 DIAGNOSIS — E11621 Type 2 diabetes mellitus with foot ulcer: Secondary | ICD-10-CM

## 2020-07-08 DIAGNOSIS — I5022 Chronic systolic (congestive) heart failure: Secondary | ICD-10-CM

## 2020-07-08 DIAGNOSIS — J439 Emphysema, unspecified: Secondary | ICD-10-CM

## 2020-07-08 NOTE — Chronic Care Management (AMB) (Signed)
Care Management   Pharmacy Note  07/08/2020 Name: Alexis [name] MRN: [id number] DOB: 1951-02-08  Subjective: Alexis [name] is a 70 y.o. year old female who is a primary care patient of Saguier, Iris Pert. The Care Management team was consulted for assistance with care management and care coordination needs.    Engaged with caregiver (daughter, Almyra Free) by telephone for check - in  in response to provider referral for pharmacy case management and/or care coordination services. Patient previously enrolled in CCM services / program but was referred to Hospice after recent discharge from hospital in March 2022. Hospice is prescribing and providing all medications and nurse / CMA is coming out 3 to 6 times per week.    The patient was given information about Care Management services today including:  1. Care Management services includes personalized support from designated clinical staff supervised by the patient's primary care provider, including individualized plan of care and coordination with other care providers. 2. 24/7 contact phone numbers for assistance for urgent and routine care needs. 3. The patient may stop case management services at any time by phone call to the office staff.  Patient agreed to services and consent obtained.  Recent Office Visit:  06/10/2020 PCP ([place], [place]) CHF f/u; patient reports Hospice nurse if coming twice a week and CMA three times a week; Noted dysuria; urine culture to be obtained by Hospice nurse. Rx Zofran for nausea related to morphine.  Assessment:  Review of patient status, including review of consultants reports, laboratory and other test data, was performed as part of comprehensive evaluation and provision of chronic care management services.   SDOH (Social Determinants of Health) assessments and interventions performed:  SDOH Interventions   Flowsheet Row Most Recent Value  SDOH Interventions   Physical Activity Interventions Other  (Comments)  [Referred to hospice - unable to exercise]       Objective:  Lab Results  Component Value Date   CREATININE 1.04 (H) 06/03/2020   CREATININE 1.52 (H) 06/02/2020   CREATININE 1.65 (H) 06/01/2020    Lab Results  Component Value Date   HGBA1C 5.7 (H) 05/28/2020       Component Value Date/Time   CHOL 93 (L) 05/10/2018 0944   TRIG 147 05/10/2018 0944   HDL 31 (L) 05/10/2018 0944   CHOLHDL 3.0 05/10/2018 0944   CHOLHDL 6 04/10/2015 1040   VLDL 50.8 (H) 04/10/2015 1040   LDLCALC 33 05/10/2018 0944   LDLDIRECT 111.0 04/10/2015 1040     Clinical ASCVD: Yes  The ASCVD Risk score (Goff DC Jr., et al., 2013) failed to calculate for the following reasons:   The patient has a prior MI or stroke diagnosis    Other: (CHADS2VASc if Afib, PHQ9 if depression, MMRC or CAT for COPD, ACT, DEXA)  BP Readings from Last 3 Encounters:  06/03/20 (!) 139/52  04/11/20 (!) 115/53  02/18/20 136/64    Care Plan  Allergies  Allergen Reactions  . Indomethacin Other (See Comments)    Renal Insufficiency  . Pregabalin Other (See Comments)    DIZZINESS   . Sertraline Nausea And Vomiting  . Sulfa Antibiotics Hives  . Clindamycin/Lincomycin   . Morphine And Related Nausea And Vomiting    Medications Reviewed Today    Reviewed by Cherre Robins, PharmD (Pharmacist) on 07/08/20 at 1328  Med List Status: <None>  Medication Order Taking? Sig Documenting Provider Last Dose Status Informant  acetaminophen (TYLENOL) 500 MG tablet [id number] Yes Take 500 mg by mouth  as needed (pain). [provider] Taking Active Multiple Informants  albuterol (VENTOLIN HFA) 108 (90 Base) MCG/ACT inhaler [id number] Yes Inhale 2 puffs into the lungs every 6 (six) hours as needed for wheezing or shortness of breath. [provider] Taking Active Multiple Informants  apixaban (ELIQUIS) 5 MG TABS tablet [id number] Yes Take 1 tablet (5 mg total) by mouth 2 (two) times daily. Dwyane Dee, MD  Taking Active   budesonide-formoterol [facility]) 160-4.5 MCG/ACT inhaler [id number] Yes INHALE 2 PUFFS BY MOUTH INTO THE LUNGS 2 (TWO) TIMES DAILY. Saguier, Percell Miller, PA-C Taking Active   clopidogrel (PLAVIX) 75 MG tablet [id number] Yes TAKE ONE TABLET BY MOUTH EVERY EVENING  Patient taking differently: Take 75 mg by mouth daily.   Waynetta Sandy, MD Taking Active   Continuous Blood Gluc Receiver (FREESTYLE LIBRE 14 DAY READER) [place] [id number] Yes Use as directed to monitor BG.  Dx E11.65 [provider] Taking Active Multiple Informants  fluticasone (FLONASE) 50 MCG/ACT nasal spray [id number] Yes Place 2 sprays into both nostrils at bedtime.  Patient taking differently: Place 2 sprays into both nostrils in the morning and at bedtime.   Saguier, Percell Miller, PA-C Taking Active   Insulin Aspart FlexPen 100 UNIT/ML SOPN [id number] Yes Inject 0-12 Units into the skin 4 (four) times daily -  before meals and at bedtime. Sliding scale: 201 - 250 = 2 units 251 - 300 = 4 units 301 - 350 = 6 units 351 - 400 = 8 units 401 - 450 = 10 units 451 - 500 = 12 units Notify MD if > 500 [provider] Taking Active Multiple Informants           Med Note Kenton Kingfisher, Earley Favor   Fri Aug 24, 2019  3:49 PM)    insulin degludec (TRESIBA) 100 UNIT/ML FlexTouch Pen [id number] Yes Inject 10 Units into the skin at bedtime. Dwyane Dee, MD Taking Active   Insulin Pen Needle (PEN NEEDLES 31GX5/16") 31G X 8 MM MISC [id number] Yes Use as needed 4 times/day.  Dx E11.65 [provider] Taking Active Multiple Informants  levothyroxine (SYNTHROID) 150 MCG tablet [id number] Yes Take 150 mcg by mouth daily before breakfast. Take with 12.76mg (278m tablet cut in half) to equal 162.38m53mdaily. [provider] Taking Active Multiple Informants  levothyroxine (SYNTHROID) 25 MCG tablet [id number] Take 12.5 mcg by mouth daily before breakfast. Take with 150m6mablet to equal 162.38mcg38mily.  [provider] Taking Active Multiple Informants  lidocaine (XYLOCAINE) 4 % external solution [id number] topically. [provider] Taking Active Multiple Informants  LORazepam (ATIVAN) 0.5 MG tablet [id number] 0.5 mg by mouth as needed. [provider]  Active            Med Note (ECKAAntony ContrasMYLos Ranch[place] de [place]10, 2022  1:28 PM) Med Classification: Central Nervous System Agents  Melatonin 10 MG TABS [id number] 20 mg by mouth at bedtime.  [provider] Taking Active Multiple Informants  methocarbamol (ROBAXIN) 500 MG tablet [id number] tab po bid  Patient taking differently: Take 500 mg by mouth 2 (two) times daily.   Saguier, EdwarPercell MillerC Taking Active   metoprolol succinate (TOPROL-XL) 25 MG 24 hr tablet [id number] ONE (1) TABLET BY MOUTH EVERY DAY  Patient taking differently: Take 25 mg by mouth daily. Hold for SBP <120 or HR <60   Saguier, EdwarPercell MillerC Taking Active   midodrine (PROAMATINE) 5 MG tablet  [id number] Yes Take 5 mg by mouth 3 (three) times daily. [provider] Taking Active Multiple Informants  ondansetron (ZOFRAN-ODT) 4 MG disintegrating tablet [id number] Yes Take 1 tablet (4 mg total) by mouth every 8 (eight) hours as needed for nausea or vomiting. Saguier, Percell Miller, PA-C Taking Active   [facility] VERIO test strip [id number] Yes 3 (three) times daily. [provider] Taking Active Multiple Informants  oxyCODONE (OXY IR/ROXICODONE) 5 MG immediate release tablet [id number] Yes Take 5 mg by mouth every 6 (six) hours as needed for pain. [provider] Taking Active Multiple Informants  phenazopyridine (PYRIDIUM) 100 MG tablet [id number] Yes Take 100 mg by mouth 3 (three) times daily as needed (dysuria). [provider] Taking Active Multiple Informants  pregabalin (LYRICA) 25 MG capsule [id number] Yes Take 25 mg by mouth every 12 (twelve) hours. [provider] Taking Active  Multiple Informants  Probiotic CAPS [id number] Yes Take 2 capsules by mouth daily. 50 billion (gummy) [provider] Taking Active Multiple Informants  RABEprazole (ACIPHEX) 20 MG tablet [id number] Yes Take 1 tablet (20 mg total) by mouth daily.  Patient taking differently: Take 20 mg by mouth 2 (two) times daily.   Jackquline Denmark, MD Taking Active   tobramycin (TOBREX) 0.3 % ophthalmic solution [id number] Yes PLACE 2 DROPS INTO BOTH EYES EVERY 6 (SIX) HOURS. Saguier, Percell Miller, PA-C Taking Active   venlafaxine XR (EFFEXOR XR) 37.5 MG 24 hr capsule [id number] Yes Take 1 capsule (37.5 mg total) by mouth daily with breakfast. Mackie Pai, PA-C Taking Active Multiple Informants          Patient Active Problem List   Diagnosis Date Noted  . Physical deconditioning 05/29/2020  . COPD (chronic obstructive pulmonary disease) ([place]) 05/29/2020  . CAD (coronary artery disease) 05/29/2020  . S/P BKA (below knee amputation), right ([place]) 05/28/2020  . Pulmonary edema 05/28/2020  . Wound infection 05/28/2020  . ESRD on hemodialysis ([place]) 05/28/2020  . Acute respiratory failure with hypoxia ([place])   . Depression 05/26/2019  . Cellulitis 05/26/2019  . Other cirrhosis of liver ([place]) 05/26/2019  . Chronic systolic CHF (congestive heart failure) ([place]) 05/26/2019  . Cellulitis in diabetic foot ([place]) 05/25/2019  . Epigastric pain   . Idiopathic esophageal varices without bleeding ([place])   . Anemia due to chronic blood loss   . Paroxysmal atrial fibrillation ([place]) 09/26/2018  . Macrocytic anemia 09/04/2018  . Pacemaker 08/25/2018  . Insulin dependent diabetes mellitus with complications 71/69/6789  . Constipation 08/25/2018  . Volume overload 08/25/2018  . Chronic anticoagulation 08/25/2018  . Morbid obesity ([place]) 08/25/2018  . Chronic diastolic heart failure ([place]) 05/17/2018  . Acute bronchitis with COPD ([place]) 01/16/2018  . Acquired absence of other right toe(s) ([place]) 11/16/2017  . Type 2  diabetes mellitus with foot ulcer ([place]) 11/16/2017  . Crush injury of left foot, initial encounter 11/28/2016  . Osteomyelitis ([place]) 07/30/2016  . Gangrene of foot ([place]) 04/27/2016  . Thrombocytopenia ([place])   . Vomiting and diarrhea 04/05/2016  . Hyperlipidemia 11/18/2015  . Gout 08/05/2014  . Cough 08/05/2014  . Allergic rhinitis 07/12/2014  . Osteoarthritis 07/12/2014  . Asthma 07/12/2014  . Congestive heart failure ([place]) 07/12/2014  . Permanent atrial fibrillation ([place]) 07/12/2014  . HTN (hypertension) 07/12/2014  . Hypothyroidism 07/12/2014  . History of substance abuse ([place]) 07/12/2014  . Peripheral vascular disease ([place]) 04/02/2013  . Discoloration of skin 04/02/2013    Conditions to be addressed/monitored: Atrial Fibrillation, CHF, HTN, HLD, COPD, DMII, ESRD, Anxiety and  Depression  Care Plan : General Pharmacy (Adult)  Updates made by Cherre Robins, PHARMD since 07/08/2020 12:00 AM    Problem: medication and chronic condition meanagement   Priority: High  Onset Date: 07/07/2020  Note:   CARE PLAN ENTRY (see longitudinal plan of care for additional care plan information)  Current Barriers:  . Chronic Disease Management support, education, and care coordination needs related to Diabetes, Heart Failure, Peripheral Vascular Disease, AFib, Hypertension, Hypothyroidism, Osteoarthritis, Gout, Hyperlipidemia   Hypertension BP Readings from Last 3 Encounters:  12/24/19 128/60  10/05/19 124/72  09/18/19 130/72   . Pharmacist Clinical Goal(s): o Over the next 90 days, patient will work with PharmD and providers to maintain BP goal <130/80 . Current regimen:  . Losartan 71m daily . Metoprolol Succinate 257mdaily  . Spironolactone 2521maily . Interventions: o Discussed BP goal o Requested patient to check BP 2-3 times per week and record . Patient self care activities - Over the next 90 days, patient will: o Check BP 2-3 times per week, document, and provide at future  appointments o Ensure daily salt intake < 2300 mg/day  Hyperlipidemia Lab Results  Component Value Date/Time   LDLCALC 33 05/10/2018 09:44 AM   LDLDIRECT 111.0 04/10/2015 10:40 AM   . Pharmacist Clinical Goal(s): o Over the next 90 days, patient will work with PharmD and providers to maintain LDL goal < 70 . Current regimen:  o Atorvastatin 39m67mily  . Patient self care activities - Over the next 90 days, patient will: o Maintain cholesterol medication regimen.   Diabetes Lab Results  Component Value Date/Time   HGBA1C 7.7 (H) 05/26/2019 04:49 PM   HGBA1C 9.0 04/03/2015 12:00 AM   . Pharmacist Clinical Goal(s): o Over the next 90 days, patient will work with PharmD and providers to achieve A1c goal <7% . Current regimen:   Tresiba 16 units daily  Novolog 4-10 units before meals . Interventions: o Discussed DM goals . Patient self care activities - Over the next 90 days, patient will: o Check blood sugar once daily, document, and provide at future appointments o Contact provider with any episodes of hypoglycemia  Heart Failure . Pharmacist Clinical Goal(s) o Over the next 90 days, patient will work with PharmD and providers to reduce symptoms associated with heart failure . Current regimen:   Furosemide 20mg75mly  Losartan 25mg 4my AM  Metoprolol succinate 25mg d25m AM  Spironolactone 50mg da83m. Interventions: o Discussed importance of weighing daily o Recommended patient to start increased furosemide dose as recommended per cardio o Recommended patient get prescription of spironolactone . Patient self care activities - Over the next 90 days, patient will: o Maintain heart failure medication regimen  Medication management . Pharmacist Clinical Goal(s): o Over the next 90 days, patient will work with PharmD and providers to achieve optimal medication adherence . Current pharmacy: [facility] . Interventions o Comprehensive medication review  performed. o Utilize [facility] pharmacy for medication synchronization, packaging and delivery . Patient self care activities - Over the next 90 days, patient will: o Focus on medication adherence by filling and taking medications appropriately  o Take medications as prescribed o Report any questions or concerns to PharmD and/or provider(s)     Medication Assistance:  None required.  Patient affirms current coverage meets needs.  Follow Up:  Patient requests no follow-up at this time.  Plan: No further follow up required: Patient will be monitored by [facility] Clinical Pharmacist [place]  [place]

## 2020-07-08 NOTE — Progress Notes (Signed)
I have personally reviewed this encounter including the documentation in this note and have collaborated with the care management provider regarding care management and care coordination activities to include development and update of the comprehensive care plan. I am certifying that I agree with the content of this note and encounter as supervising provider.  Mackie Pai, PA-C

## 2020-07-08 NOTE — Patient Instructions (Signed)
Visit Information  PATIENT GOALS: Goals Addressed            This Visit's Progress   . Chronic Care Management Pharmacy Care Plan       CARE PLAN ENTRY (see longitudinal plan of care for additional care plan information)  Current Barriers:  . Chronic Disease Management support, education, and care coordination needs related to Diabetes, Heart Failure, Peripheral Vascular Disease, AFib, Hypertension, Hypothyroidism, Osteoarthritis, Gout, Hyperlipidemia   Hypertension BP Readings from Last 3 Encounters:  12/24/19 128/60  10/05/19 124/72  09/18/19 130/72   . Pharmacist Clinical Goal(s): o Over the next 90 days, patient will work with PharmD and providers to maintain BP goal <130/80 . Current regimen:  . Losartan 81m daily . Metoprolol Succinate 242mdaily  . Spironolactone 2540maily . Interventions: o Discussed BP goal o Requested patient to check BP 2-3 times per week and record . Patient self care activities - Over the next 90 days, patient will: o Check BP 2-3 times per week, document, and provide at future appointments o Ensure daily salt intake < 2300 mg/day  Hyperlipidemia Lab Results  Component Value Date/Time   LDLCALC 33 05/10/2018 09:44 AM   LDLDIRECT 111.0 04/10/2015 10:40 AM   . Pharmacist Clinical Goal(s): o Over the next 90 days, patient will work with PharmD and providers to maintain LDL goal < 70 . Current regimen:  o Atorvastatin 26m36mily  . Patient self care activities - Over the next 90 days, patient will: o Maintain cholesterol medication regimen.   Diabetes Lab Results  Component Value Date/Time   HGBA1C 7.7 (H) 05/26/2019 04:49 PM   HGBA1C 9.0 04/03/2015 12:00 AM   . Pharmacist Clinical Goal(s): o Over the next 90 days, patient will work with PharmD and providers to achieve A1c goal <7% . Current regimen:   Tresiba 16 units daily  Novolog 4-10 units before meals . Interventions: o Discussed DM goals . Patient self care activities -  Over the next 90 days, patient will: o Check blood sugar once daily, document, and provide at future appointments o Contact provider with any episodes of hypoglycemia  Heart Failure . Pharmacist Clinical Goal(s) o Over the next 90 days, patient will work with PharmD and providers to reduce symptoms associated with heart failure . Current regimen:   Furosemide 20mg89mly  Losartan 25mg 71my AM  Metoprolol succinate 25mg d60m AM  Spironolactone 50mg da92m. Interventions: o Discussed importance of weighing daily o Recommended patient to start increased furosemide dose as recommended per cardio o Recommended patient get prescription of spironolactone . Patient self care activities - Over the next 90 days, patient will: o Maintain heart failure medication regimen  Medication management . Pharmacist Clinical Goal(s): o Over the next 90 days, patient will work with PharmD and providers to achieve optimal medication adherence . Current pharmacy: [facility] . Interventions o Comprehensive medication review performed. o Utilize [facility] pharmacy for medication synchronization, packaging and delivery . Patient self care activities - Over the next 90 days, patient will: o Focus on medication adherence by filling and taking medications appropriately  o Take medications as prescribed o Report any questions or concerns to PharmD and/or provider(s)  Please see past updates related to this goal by clicking on the "Past Updates" button in the selected goal         The patient verbalized understanding of instructions, educational materials, and care plan provided today and declined offer to receive copy of patient instructions, educational materials,  and care plan.   No further follow up required: Patient enrolled in [place], PharmD Clinical Pharmacist [place]

## 2020-07-17 DIAGNOSIS — E875 Hyperkalemia: Secondary | ICD-10-CM | POA: Diagnosis not present

## 2020-07-19 DIAGNOSIS — E875 Hyperkalemia: Secondary | ICD-10-CM | POA: Diagnosis not present

## 2020-07-30 ENCOUNTER — Telehealth: Payer: Self-pay

## 2020-07-30 ENCOUNTER — Other Ambulatory Visit: Payer: Self-pay | Admitting: Gastroenterology

## 2020-07-30 NOTE — Telephone Encounter (Signed)
Caller states wife was vax for Covid. Caller states she is getting ready to be admitted to hospice/respite care. Dog chewed up her vax card, he is requesting a copy for hospice.

## 2020-07-30 NOTE — Telephone Encounter (Signed)
Immunization records given to husband at office visit today

## 2020-07-30 NOTE — Telephone Encounter (Signed)
Called pt to ask for office fax number , no answer , lvm to return call

## 2020-07-31 ENCOUNTER — Other Ambulatory Visit: Payer: Self-pay | Admitting: Gastroenterology

## 2020-08-19 DIAGNOSIS — E875 Hyperkalemia: Secondary | ICD-10-CM | POA: Diagnosis not present

## 2020-08-29 DEATH — deceased

## 2024-01-27 ENCOUNTER — Other Ambulatory Visit (HOSPITAL_BASED_OUTPATIENT_CLINIC_OR_DEPARTMENT_OTHER): Payer: Self-pay
